# Patient Record
Sex: Male | Born: 1955
Health system: Southern US, Community
[De-identification: ages and names within clinical notes are randomized; demographics above are authoritative.]

## PROBLEM LIST (undated history)

## (undated) DIAGNOSIS — I82B12 Acute embolism and thrombosis of left subclavian vein: Secondary | ICD-10-CM

## (undated) DIAGNOSIS — Z952 Presence of prosthetic heart valve: Secondary | ICD-10-CM

## (undated) DIAGNOSIS — J189 Pneumonia, unspecified organism: Secondary | ICD-10-CM

## (undated) DIAGNOSIS — Z9289 Personal history of other medical treatment: Secondary | ICD-10-CM

## (undated) DIAGNOSIS — K8020 Calculus of gallbladder without cholecystitis without obstruction: Secondary | ICD-10-CM

## (undated) DIAGNOSIS — R911 Solitary pulmonary nodule: Secondary | ICD-10-CM

## (undated) DIAGNOSIS — R011 Cardiac murmur, unspecified: Secondary | ICD-10-CM

## (undated) DIAGNOSIS — D3A8 Other benign neuroendocrine tumors: Secondary | ICD-10-CM

## (undated) DIAGNOSIS — C787 Secondary malignant neoplasm of liver and intrahepatic bile duct: Secondary | ICD-10-CM

## (undated) DIAGNOSIS — J8410 Pulmonary fibrosis, unspecified: Secondary | ICD-10-CM

## (undated) DIAGNOSIS — I319 Disease of pericardium, unspecified: Secondary | ICD-10-CM

## (undated) DIAGNOSIS — E7800 Pure hypercholesterolemia, unspecified: Secondary | ICD-10-CM

## (undated) DIAGNOSIS — I10 Essential (primary) hypertension: Secondary | ICD-10-CM

## (undated) DIAGNOSIS — IMO0001 Reserved for inherently not codable concepts without codable children: Secondary | ICD-10-CM

## (undated) DIAGNOSIS — C6290 Malignant neoplasm of unspecified testis, unspecified whether descended or undescended: Secondary | ICD-10-CM

## (undated) DIAGNOSIS — M7120 Synovial cyst of popliteal space [Baker], unspecified knee: Secondary | ICD-10-CM

## (undated) HISTORY — DX: Malignant neoplasm of unspecified testis, unspecified whether descended or undescended: C62.90

## (undated) HISTORY — DX: Pulmonary fibrosis, unspecified: J84.10

## (undated) HISTORY — PX: KNEE SURGERY: SHX244

## (undated) HISTORY — PX: NASAL SEPTUM SURGERY: SHX37

## (undated) HISTORY — DX: Other benign neuroendocrine tumors: D3A.8

## (undated) HISTORY — PX: BACK SURGERY: SHX140

## (undated) HISTORY — DX: Synovial cyst of popliteal space (Baker), unspecified knee: M71.20

## (undated) HISTORY — DX: Disease of pericardium, unspecified: I31.9

## (undated) HISTORY — PX: COLONOSCOPY: SHX174

---

## 1988-08-05 DIAGNOSIS — J189 Pneumonia, unspecified organism: Secondary | ICD-10-CM

## 1988-08-05 DIAGNOSIS — C6290 Malignant neoplasm of unspecified testis, unspecified whether descended or undescended: Secondary | ICD-10-CM

## 1988-08-05 DIAGNOSIS — I82B12 Acute embolism and thrombosis of left subclavian vein: Secondary | ICD-10-CM

## 1988-08-05 HISTORY — DX: Pneumonia, unspecified organism: J18.9

## 1988-08-05 HISTORY — PX: THORACOTOMY: SUR1349

## 1988-08-05 HISTORY — DX: Malignant neoplasm of unspecified testis, unspecified whether descended or undescended: C62.90

## 1988-08-05 HISTORY — DX: Acute embolism and thrombosis of left subclavian vein: I82.B12

## 2004-12-28 ENCOUNTER — Ambulatory Visit: Payer: Self-pay | Admitting: Internal Medicine

## 2005-01-03 ENCOUNTER — Ambulatory Visit: Payer: Self-pay | Admitting: Internal Medicine

## 2005-04-19 LAB — HM COLONOSCOPY: HM Colonoscopy: NORMAL

## 2005-05-17 ENCOUNTER — Ambulatory Visit: Payer: Self-pay | Admitting: Internal Medicine

## 2005-05-21 ENCOUNTER — Ambulatory Visit: Payer: Self-pay | Admitting: Internal Medicine

## 2006-05-09 ENCOUNTER — Ambulatory Visit: Payer: Self-pay | Admitting: Internal Medicine

## 2006-05-16 ENCOUNTER — Ambulatory Visit: Payer: Self-pay | Admitting: Internal Medicine

## 2006-09-11 ENCOUNTER — Ambulatory Visit: Payer: Self-pay | Admitting: Internal Medicine

## 2006-09-11 LAB — CONVERTED CEMR LAB
Cholesterol: 205 mg/dL (ref 0–200)
Direct LDL: 134.5 mg/dL
HDL: 56.2 mg/dL (ref >39.0)
Total CHOL/HDL Ratio: 3.6
Triglycerides: 76 mg/dL (ref 0–149)
VLDL: 15 mg/dL (ref 0–40)

## 2007-06-06 ENCOUNTER — Encounter: Payer: Self-pay | Admitting: Internal Medicine

## 2007-06-06 DIAGNOSIS — M7120 Synovial cyst of popliteal space [Baker], unspecified knee: Secondary | ICD-10-CM | POA: Insufficient documentation

## 2007-07-09 ENCOUNTER — Ambulatory Visit: Payer: Self-pay | Admitting: Internal Medicine

## 2007-07-09 LAB — CONVERTED CEMR LAB
ALT: 25 units/L (ref 0–53)
AST: 21 units/L (ref 0–37)
Albumin: 3.9 g/dL (ref 3.5–5.2)
Alkaline Phosphatase: 46 units/L (ref 39–117)
BUN: 16 mg/dL (ref 6–23)
Basophils Absolute: 0 10*3/uL (ref 0.0–0.1)
Basophils Relative: 0.1 % (ref 0.0–1.0)
Bilirubin Urine: NEGATIVE
Bilirubin, Direct: 0.2 mg/dL (ref 0.0–0.3)
CO2: 34 meq/L — ABNORMAL HIGH (ref 19–32)
Calcium: 9.5 mg/dL (ref 8.4–10.5)
Chloride: 101 meq/L (ref 96–112)
Cholesterol: 213 mg/dL (ref 0–200)
Creatinine, Ser: 1.1 mg/dL (ref 0.4–1.5)
Direct LDL: 142.3 mg/dL
Eosinophils Absolute: 0.2 10*3/uL (ref 0.0–0.6)
Eosinophils Relative: 3.5 % (ref 0.0–5.0)
GFR calc Af Amer: 90 mL/min
GFR calc non Af Amer: 74 mL/min
Glucose, Bld: 94 mg/dL (ref 70–99)
HCT: 47.2 % (ref 39.0–52.0)
HDL: 50.9 mg/dL (ref >39.0)
Hemoglobin, Urine: NEGATIVE
Hemoglobin: 16.4 g/dL (ref 13.0–17.0)
Ketones, ur: NEGATIVE mg/dL
Leukocytes, UA: NEGATIVE
Lymphocytes Relative: 18.6 % (ref 12.0–46.0)
MCHC: 34.8 g/dL (ref 30.0–36.0)
MCV: 93.9 fL (ref 78.0–100.0)
Monocytes Absolute: 0.5 10*3/uL (ref 0.2–0.7)
Monocytes Relative: 8 % (ref 3.0–11.0)
Neutro Abs: 4 10*3/uL (ref 1.4–7.7)
Neutrophils Relative %: 69.8 % (ref 43.0–77.0)
Nitrite: NEGATIVE
PSA: 1.1 ng/mL (ref 0.10–4.00)
Platelets: 218 10*3/uL (ref 150–400)
Potassium: 4.5 meq/L (ref 3.5–5.1)
RBC: 5.03 M/uL (ref 4.22–5.81)
RDW: 11.6 % (ref 11.5–14.6)
Sodium: 140 meq/L (ref 135–145)
Specific Gravity, Urine: 1.015 (ref 1.000–1.03)
TSH: 1.12 microintl units/mL (ref 0.35–5.50)
Total Bilirubin: 0.9 mg/dL (ref 0.3–1.2)
Total CHOL/HDL Ratio: 4.2
Total Protein, Urine: NEGATIVE mg/dL
Total Protein: 6.4 g/dL (ref 6.0–8.3)
Triglycerides: 68 mg/dL (ref 0–149)
Urine Glucose: NEGATIVE mg/dL
Urobilinogen, UA: 0.2 (ref 0.0–1.0)
VLDL: 14 mg/dL (ref 0–40)
WBC: 5.8 10*3/uL (ref 4.5–10.5)
pH: 7 (ref 5.0–8.0)

## 2007-07-10 ENCOUNTER — Encounter: Payer: Self-pay | Admitting: Internal Medicine

## 2007-07-16 ENCOUNTER — Ambulatory Visit: Payer: Self-pay | Admitting: Internal Medicine

## 2007-07-16 DIAGNOSIS — E785 Hyperlipidemia, unspecified: Secondary | ICD-10-CM | POA: Insufficient documentation

## 2007-08-27 ENCOUNTER — Ambulatory Visit: Payer: Self-pay | Admitting: Internal Medicine

## 2007-08-27 LAB — CONVERTED CEMR LAB
ALT: 31 units/L (ref 0–53)
AST: 26 units/L (ref 0–37)
Albumin: 4.1 g/dL (ref 3.5–5.2)
Alkaline Phosphatase: 48 units/L (ref 39–117)
Bilirubin, Direct: 0.2 mg/dL (ref 0.0–0.3)
Cholesterol: 174 mg/dL (ref 0–200)
HDL: 55.7 mg/dL (ref >39.0)
LDL Cholesterol: 107 mg/dL — ABNORMAL HIGH (ref 0–99)
Total Bilirubin: 1.3 mg/dL — ABNORMAL HIGH (ref 0.3–1.2)
Total CHOL/HDL Ratio: 3.1
Total Protein: 6.9 g/dL (ref 6.0–8.3)
Triglycerides: 56 mg/dL (ref 0–149)
VLDL: 11 mg/dL (ref 0–40)

## 2007-09-01 ENCOUNTER — Encounter: Payer: Self-pay | Admitting: Internal Medicine

## 2007-10-16 ENCOUNTER — Encounter: Payer: Self-pay | Admitting: Internal Medicine

## 2008-10-10 ENCOUNTER — Ambulatory Visit: Payer: Self-pay | Admitting: Internal Medicine

## 2008-10-10 LAB — CONVERTED CEMR LAB
ALT: 26 units/L (ref 0–53)
AST: 24 units/L (ref 0–37)
Albumin: 3.9 g/dL (ref 3.5–5.2)
Alkaline Phosphatase: 44 units/L (ref 39–117)
BUN: 17 mg/dL (ref 6–23)
Basophils Absolute: 0 10*3/uL (ref 0.0–0.1)
Basophils Relative: 0.1 % (ref 0.0–3.0)
Bilirubin Urine: NEGATIVE
Bilirubin, Direct: 0.2 mg/dL (ref 0.0–0.3)
CO2: 35 meq/L — ABNORMAL HIGH (ref 19–32)
Calcium: 9 mg/dL (ref 8.4–10.5)
Chloride: 100 meq/L (ref 96–112)
Cholesterol: 202 mg/dL (ref 0–200)
Creatinine, Ser: 1 mg/dL (ref 0.4–1.5)
Direct LDL: 139.2 mg/dL
Eosinophils Absolute: 0.2 10*3/uL (ref 0.0–0.7)
Eosinophils Relative: 3.8 % (ref 0.0–5.0)
GFR calc Af Amer: 100 mL/min
GFR calc non Af Amer: 83 mL/min
Glucose, Bld: 98 mg/dL (ref 70–99)
HCT: 46.4 % (ref 39.0–52.0)
HDL: 43 mg/dL (ref >39.0)
Hemoglobin, Urine: NEGATIVE
Hemoglobin: 16 g/dL (ref 13.0–17.0)
Ketones, ur: NEGATIVE mg/dL
Leukocytes, UA: NEGATIVE
Lymphocytes Relative: 15.8 % (ref 12.0–46.0)
MCHC: 34.4 g/dL (ref 30.0–36.0)
MCV: 93.7 fL (ref 78.0–100.0)
Monocytes Absolute: 0.5 10*3/uL (ref 0.1–1.0)
Monocytes Relative: 9 % (ref 3.0–12.0)
Neutro Abs: 3.8 10*3/uL (ref 1.4–7.7)
Neutrophils Relative %: 71.3 % (ref 43.0–77.0)
Nitrite: NEGATIVE
PSA: 0.8 ng/mL (ref 0.10–4.00)
Platelets: 202 10*3/uL (ref 150–400)
Potassium: 4.6 meq/L (ref 3.5–5.1)
RBC: 4.95 M/uL (ref 4.22–5.81)
RDW: 11.9 % (ref 11.5–14.6)
Sodium: 140 meq/L (ref 135–145)
Specific Gravity, Urine: 1.015 (ref 1.000–1.035)
TSH: 0.97 microintl units/mL (ref 0.35–5.50)
Total Bilirubin: 1.3 mg/dL — ABNORMAL HIGH (ref 0.3–1.2)
Total CHOL/HDL Ratio: 4.7
Total Protein, Urine: NEGATIVE mg/dL
Total Protein: 6.3 g/dL (ref 6.0–8.3)
Triglycerides: 63 mg/dL (ref 0–149)
Urine Glucose: NEGATIVE mg/dL
Urobilinogen, UA: 0.2 (ref 0.0–1.0)
VLDL: 13 mg/dL (ref 0–40)
WBC: 5.4 10*3/uL (ref 4.5–10.5)
pH: 7 (ref 5.0–8.0)

## 2008-10-14 ENCOUNTER — Ambulatory Visit: Payer: Self-pay | Admitting: Internal Medicine

## 2008-10-14 DIAGNOSIS — R030 Elevated blood-pressure reading, without diagnosis of hypertension: Secondary | ICD-10-CM | POA: Insufficient documentation

## 2008-10-15 DIAGNOSIS — C6290 Malignant neoplasm of unspecified testis, unspecified whether descended or undescended: Secondary | ICD-10-CM | POA: Insufficient documentation

## 2008-10-19 ENCOUNTER — Telehealth: Payer: Self-pay | Admitting: Internal Medicine

## 2009-01-18 ENCOUNTER — Telehealth: Payer: Self-pay | Admitting: Internal Medicine

## 2009-01-31 ENCOUNTER — Telehealth: Payer: Self-pay | Admitting: Internal Medicine

## 2009-11-16 ENCOUNTER — Telehealth: Payer: Self-pay | Admitting: Internal Medicine

## 2010-06-21 ENCOUNTER — Ambulatory Visit: Payer: Self-pay | Admitting: Internal Medicine

## 2010-06-21 LAB — CONVERTED CEMR LAB
ALT: 29 units/L (ref 0–53)
AST: 26 units/L (ref 0–37)
Albumin: 4.3 g/dL (ref 3.5–5.2)
Alkaline Phosphatase: 59 units/L (ref 39–117)
BUN: 19 mg/dL (ref 6–23)
Basophils Absolute: 0 10*3/uL (ref 0.0–0.1)
Basophils Relative: 0.5 % (ref 0.0–3.0)
Bilirubin Urine: NEGATIVE
Bilirubin, Direct: 0.1 mg/dL (ref 0.0–0.3)
CO2: 32 meq/L (ref 19–32)
Calcium: 9.6 mg/dL (ref 8.4–10.5)
Chloride: 98 meq/L (ref 96–112)
Cholesterol: 169 mg/dL (ref 0–200)
Creatinine, Ser: 1.2 mg/dL (ref 0.4–1.5)
Eosinophils Absolute: 0.2 10*3/uL (ref 0.0–0.7)
Eosinophils Relative: 2.8 % (ref 0.0–5.0)
GFR calc non Af Amer: 69.06 mL/min (ref >60)
Glucose, Bld: 97 mg/dL (ref 70–99)
HCT: 48.2 % (ref 39.0–52.0)
HDL: 47 mg/dL (ref >39.00)
Hemoglobin: 16.8 g/dL (ref 13.0–17.0)
Ketones, ur: NEGATIVE mg/dL
LDL Cholesterol: 108 mg/dL — ABNORMAL HIGH (ref 0–99)
Leukocytes, UA: NEGATIVE
Lymphocytes Relative: 13.4 % (ref 12.0–46.0)
Lymphs Abs: 0.9 10*3/uL (ref 0.7–4.0)
MCHC: 34.9 g/dL (ref 30.0–36.0)
MCV: 93.3 fL (ref 78.0–100.0)
Monocytes Absolute: 0.6 10*3/uL (ref 0.1–1.0)
Monocytes Relative: 8.5 % (ref 3.0–12.0)
Neutro Abs: 5.2 10*3/uL (ref 1.4–7.7)
Neutrophils Relative %: 74.8 % (ref 43.0–77.0)
Nitrite: NEGATIVE
PSA: 1.14 ng/mL (ref 0.10–4.00)
Platelets: 239 10*3/uL (ref 150.0–400.0)
Potassium: 5.2 meq/L — ABNORMAL HIGH (ref 3.5–5.1)
RBC: 5.17 M/uL (ref 4.22–5.81)
RDW: 12.6 % (ref 11.5–14.6)
Sodium: 141 meq/L (ref 135–145)
Specific Gravity, Urine: 1.02 (ref 1.000–1.030)
TSH: 1.31 microintl units/mL (ref 0.35–5.50)
Total Bilirubin: 1 mg/dL (ref 0.3–1.2)
Total CHOL/HDL Ratio: 4
Total Protein, Urine: NEGATIVE mg/dL
Total Protein: 6.8 g/dL (ref 6.0–8.3)
Triglycerides: 72 mg/dL (ref 0.0–149.0)
Urine Glucose: NEGATIVE mg/dL
Urobilinogen, UA: 0.2 (ref 0.0–1.0)
VLDL: 14.4 mg/dL (ref 0.0–40.0)
WBC: 7 10*3/uL (ref 4.5–10.5)
pH: 6 (ref 5.0–8.0)

## 2010-06-25 ENCOUNTER — Ambulatory Visit: Payer: Self-pay | Admitting: Internal Medicine

## 2010-06-25 ENCOUNTER — Encounter: Payer: Self-pay | Admitting: Internal Medicine

## 2010-06-25 DIAGNOSIS — R011 Cardiac murmur, unspecified: Secondary | ICD-10-CM | POA: Insufficient documentation

## 2010-06-26 ENCOUNTER — Encounter (INDEPENDENT_AMBULATORY_CARE_PROVIDER_SITE_OTHER): Payer: Self-pay | Admitting: *Deleted

## 2010-07-09 ENCOUNTER — Ambulatory Visit: Payer: Self-pay

## 2010-07-09 ENCOUNTER — Ambulatory Visit (HOSPITAL_COMMUNITY)
Admission: RE | Admit: 2010-07-09 | Discharge: 2010-07-09 | Payer: Self-pay | Source: Home / Self Care | Admitting: Internal Medicine

## 2010-07-09 ENCOUNTER — Encounter: Payer: Self-pay | Admitting: Internal Medicine

## 2010-07-12 ENCOUNTER — Encounter: Payer: Self-pay | Admitting: Internal Medicine

## 2010-07-23 ENCOUNTER — Ambulatory Visit: Payer: Self-pay | Admitting: Internal Medicine

## 2010-08-14 ENCOUNTER — Other Ambulatory Visit: Payer: Self-pay | Admitting: Internal Medicine

## 2010-08-14 LAB — BASIC METABOLIC PANEL
BUN: 23 mg/dL (ref 6–23)
CO2: 35 mEq/L — ABNORMAL HIGH (ref 19–32)
Calcium: 9.4 mg/dL (ref 8.4–10.5)
Chloride: 102 mEq/L (ref 96–112)
Creatinine, Ser: 1.1 mg/dL (ref 0.4–1.5)
GFR: 75.77 mL/min (ref >60.00)
Glucose, Bld: 134 mg/dL — ABNORMAL HIGH (ref 70–99)
Potassium: 4.7 mEq/L (ref 3.5–5.1)
Sodium: 141 mEq/L (ref 135–145)

## 2010-08-14 LAB — TESTOSTERONE: Testosterone: 359.74 ng/dL (ref 350.00–890.00)

## 2010-08-23 ENCOUNTER — Encounter: Payer: Self-pay | Admitting: Internal Medicine

## 2010-09-04 NOTE — Assessment & Plan Note (Signed)
Summary: PHYSICAL--STC   Vital Signs:  Patient profile:   55 year old male Height:      69 inches Weight:      190 pounds BMI:     28.16 O2 Sat:      98 % on Room air Temp:     97.6 degrees F oral Pulse rate:   79 / minute BP sitting:   138 / 98  (left arm) Cuff size:   large  Vitals Entered By: Bill Salinas CMA (June 25, 2010 2:36 PM)  O2 Flow:  Room air CC: cpx/ ab   Primary Care Jeanell Mangan:  Norins  CC:  cpx/ ab.  History of Present Illness: patient presents for routine  biannual check-up. has been doing well. However, he has continued to have mild hypertension that did not repsond to HCTZ which he subsequently stopped. Everyting else is fine with no medical complaints.   Current Medications (verified): 1)  Simvastatin 20 Mg  Tabs (Simvastatin) .Marland Kitchen.. 1 Q Pm 2)  Hydrochlorothiazide 25 Mg Tabs (Hydrochlorothiazide) .Marland Kitchen.. 1 By Mouth Once Daily  Allergies (verified): 1)  ! 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 married 1977 2 sons: 1979, 26- married with 2 daughters 1 in route: Oldest in [employer] [employer], younger [place], [place] 1 daughter: 31 [place], married-1 granddaughter Psychologist, occupational- Energy manager SO- good health; marriage in good health  Review of Systems       The patient complains of dyspnea on exertion and severe indigestion/heartburn.  The patient denies anorexia, fever, weight loss, weight gain, decreased hearing, hoarseness, peripheral edema, prolonged cough,  headaches, abdominal pain, melena, hematochezia, hematuria, genital sores, suspicious skin lesions, difficulty walking, unusual weight change, enlarged lymph nodes, angioedema, and testicular masses.    Physical Exam  General:  Well-developed,well-nourished,in no acute distress; alert,appropriate and cooperative throughout examination Head:  Normocephalic and atraumatic without obvious abnormalities. No apparent alopecia or balding. Eyes:  No corneal or conjunctival inflammation noted. EOMI. Perrla. Funduscopic exam benign, without hemorrhages, exudates or papilledema. Vision grossly normal. Ears:  External ear exam shows no significant lesions or deformities.  Otoscopic examination reveals clear canals, tympanic membranes are intact bilaterally without bulging, retraction, inflammation or discharge. Hearing is grossly normal bilaterally. Nose:  no external deformity and no external erythema.   Mouth:  Oral mucosa and oropharynx without lesions or exudates.  Teeth in good repair. Neck:  supple, full ROM, no thyromegaly, and no carotid bruits.   Chest Wall:  No deformities, masses, tenderness or gynecomastia noted. Lungs:  Normal respiratory effort, chest expands symmetrically. Lungs are clear to auscultation, no crackles or wheezes. Heart:  normal rate and regular rhythm.  III/VI murmur at RSB with I/VI diastolic component; II/VI murmur at LSB; II/VI murmur at apex.  Abdomen:  soft, non-tender, normal bowel sounds, no guarding, no rigidity, and no hepatomegaly.   Genitalia:  circumcised, no hydrocele, no varicocele, no scrotal masses, and no testicular masses or atrophy.   Prostate:  deferered Msk:  normal ROM, no joint tenderness, no joint swelling, no joint warmth, no joint  deformities, and no joint instability.   Pulses:  2+ radial, DP and PT pulses bilaterally Extremities:  No clubbing, cyanosis, edema, or deformity noted with normal full range of motion of all joints.   Neurologic:  alert &  oriented X3, cranial nerves II-XII intact, strength normal in all extremities, sensation intact to light touch, gait normal, and DTRs symmetrical and normal.   Skin:  turgor normal, color normal, no rashes, no suspicious lesions, and no ulcerations.   Cervical Nodes:  no anterior cervical adenopathy and no posterior cervical adenopathy.   Inguinal Nodes:  no R inguinal adenopathy and no L inguinal adenopathy.   Psych:  Oriented X3, memory intact for recent and remote, normally interactive, and good eye contact.     Impression & Recommendations:  Problem # 1:  HEART MURMUR, SYSTOLIC (ICD-785.2) Patient with increased murmurs. He has had a mild murmur in the past. He reports that a doctor at Urgent Care in [place] recently commented on him having a murmur.  Plan - 2D echo to better quantify murmurs.  Orders: Cardiology Referral (Cardiology)  Problem # 2:  PREHYPERTENSION (ICD-796.2) Patient blood pressure is definitely in the abnormal range. Discussed this with him  Plan - will start with ARB therapy - losartan 50mg  once daily           follow-up metabolic panel in one month along with BP check  The following medications were removed from the medication list:    Hydrochlorothiazide 25 Mg Tabs (Hydrochlorothiazide) .Marland Kitchen... 1 by mouth once daily His updated medication list for this problem includes:    Losartan Potassium 50 Mg Tabs (Losartan potassium) .Marland Kitchen... 1 by mouth once daily  Problem # 3:  DYSLIPIDEMIA (ICD-272.4)  His updated medication list for this problem includes:    Simvastatin 20 Mg Tabs (Simvastatin) .Marland Kitchen... 1 q pm  Labs Reviewed: SGOT: 26 (06/21/2010)   SGPT: 29 (06/21/2010)   HDL:47.00 (06/21/2010), 43.0 (10/10/2008)  LDL:108 (06/21/2010), DEL (10/10/2008)  Chol:169 (06/21/2010), 202 (10/10/2008)  Trig:72.0 (06/21/2010), 63 (10/10/2008)  Excellent control on present medication. Will continue the same  Problem # 4:  Hx of SEMINOMA (ICD-186.9) He has had a long term  stable remission  Problem # 5:  Preventive Health Care (ICD-V70.0) Interval history is unremarkable. His physical exam is normal except for murmurs appreciated. Lab results are excellent. Current with colorectal cancer screening with colonoscopy in June '06; Immunizations: tetnus and flu Nov '11. 12 lead EKG is negative for any injury or ischemia.  He does report decreased libido, energy and stamina. Discussed the possibility of hypogonadism. He will return for testosterone level in one month.  In summary - a very nice man who is medically stable with cardiac murmurs that will be evaluated with recommendations to follow the study. He will return for BP monitoring as noted.   Complete Medication List: 1)  Simvastatin 20 Mg Tabs (Simvastatin) .Marland Kitchen.. 1 q pm 2)  Losartan Potassium 50 Mg Tabs (Losartan potassium) .Marland Kitchen.. 1 by mouth once daily  Patient: Ronald Thompson Note: All result statuses are Final unless otherwise noted.  Tests: (1) BMP (METABOL)   Sodium                    141 mEq/L                   135-145   Potassium            [H]  5.2 mEq/L  3.5-5.1   Chloride                  98 mEq/L                    96-112   Carbon Dioxide            32 mEq/L                    19-32   Glucose                   97 mg/dL                    18-84   BUN                       19 mg/dL                    1-66   Creatinine                1.2 mg/dL                   0.6-3.0   Calcium                   9.6 mg/dL                   1.6-01.0   GFR                       69.06 mL/min                >60  Tests: (2) CBC Platelet w/Diff (CBCD)   White Cell Count          7.0 K/uL                    4.5-10.5   Red Cell Count            5.17 Mil/uL                 4.22-5.81   Hemoglobin                16.8 g/dL                   93.2-35.5   Hematocrit                48.2 %                      39.0-52.0   MCV                       93.3 fl                     78.0-100.0   MCHC                       34.9 g/dL                   73.2-20.2   RDW                       12.6 %                      11.5-14.6   Platelet Count  239.0 K/uL                  150.0-400.0   Neutrophil %              74.8 %                      43.0-77.0   Lymphocyte %              13.4 %                      12.0-46.0   Monocyte %                8.5 %                       3.0-12.0   Eosinophils%              2.8 %                       0.0-5.0   Basophils %               0.5 %                       0.0-3.0   Neutrophill Absolute      5.2 K/uL                    1.4-7.7   Lymphocyte Absolute       0.9 K/uL                    0.7-4.0   Monocyte Absolute         0.6 K/uL                    0.1-1.0  Eosinophils, Absolute                             0.2 K/uL                    0.0-0.7   Basophils Absolute        0.0 K/uL                    0.0-0.1  Tests: (3) Hepatic/Liver Function Panel (HEPATIC)   Total Bilirubin           1.0 mg/dL                   5.1-8.8   Direct Bilirubin          0.1 mg/dL                   4.1-6.6   Alkaline Phosphatase      59 U/L                      39-117   AST                       26 U/L                      0-37   ALT                       29 U/L  0-53   Total Protein             6.8 g/dL                    0.9-8.1   Albumin                   4.3 g/dL                    1.9-1.4  Tests: (4) TSH (TSH)   FastTSH                   1.31 uIU/mL                 0.35-5.50  Tests: (5) Lipid Panel (LIPID)   Cholesterol               169 mg/dL                   7-829     ATP III Classification            Desirable:  < 200 mg/dL                    Borderline High:  200 - 239 mg/dL               High:  > = 240 mg/dL   Triglycerides             72.0 mg/dL                  5.6-213.0     Normal:  <150 mg/dL     Borderline High:  865 - 199 mg/dL   HDL                       78.46 mg/dL                 >96.29   VLDL Cholesterol          14.4 mg/dL                   5.2-84.1   LDL Cholesterol      [H]  324 mg/dL                   4-01  CHO/HDL Ratio:  CHD Risk                             4                    Men          Women     1/2 Average Risk     3.4          3.3     Average Risk          5.0          4.4     2X Average Risk          9.6          7.1     3X Average Risk          15.0          11.0                           Tests: (6) Prostate  Specific Antigen (PSA)   PSA-Hyb                   1.14 ng/mL                  0.10-4.00  Tests: (7) UDip w/Micro (URINE)   Color                     LT. YELLOW       RANGE:  Yellow;Lt. Yellow   Clarity                   CLEAR                       Clear   Specific Gravity          1.020                       1.000 - 1.030   Urine Ph                  6.0                         5.0-8.0   Protein                   NEGATIVE                    Negative   Urine Glucose             NEGATIVE                    Negative   Ketones                   NEGATIVE                    Negative   Urine Bilirubin           NEGATIVE                    Negative   Blood                     SMALL                       Negative   Urobilinogen              0.2                         0.0 - 1.0   Leukocyte Esterace        NEGATIVE                    Negative   Nitrite                   NEGATIVE                    Negative   Urine WBC                 0-2/hpf                     0-2/hpf   Urine RBC                 0-2/hpf  0-2/hpf   Urine Mucus               Presence of                 NonePrescriptions: SIMVASTATIN 20 MG  TABS (SIMVASTATIN) 1 q PM  #30 Tablet x 12   Entered and Authorized by:   Jacques Navy MD   Signed by:   Jacques Navy MD on 06/25/2010   Method used:   Electronically to        [facility] (retail)       [address]       [place], [place]  [address]       Ph: [phone] or [phone]       Fax: [phone]   RxID:    [id number] LOSARTAN POTASSIUM 50 MG TABS (LOSARTAN POTASSIUM) 1 by mouth once daily  #30 x 12   Entered and Authorized by:   Jacques Navy MD   Signed by:   Jacques Navy MD on 06/25/2010   Method used:   Electronically to        [facility] (retail)       [address]       [place], [place]  [address]       Ph: [phone] or [phone]       Fax: [phone]   RxID:   [id number]    Orders Added: 1)  Cardiology Referral [Cardiology] 2)  Est. Patient Level III [[address]] 3)  Est. Patient 40-64 years [[address]]   Immunization History:  Tetanus/Td Immunization History:    Tetanus/Td:  historical (06/09/2010)  Influenza Immunization History:    Influenza:  historical (06/09/2010)   Immunization History:  Tetanus/Td Immunization History:    Tetanus/Td:  Historical (06/09/2010)  Influenza Immunization History:    Influenza:  Historical (06/09/2010)

## 2010-09-04 NOTE — Letter (Signed)
   [facility] [address] [place], [place]  [address] Phone: [phone]      July 12, 2010   [place] [facility] [address] [place], [place] [address]  RE:  LAB RESULTS  Dear  Mr. Manfredo,  The following is an interpretation of your most recent lab tests.  Please take note of any instructions provided or changes to medications that have resulted from your lab work.   2 D echo reveals mild wall thickening of no pathologic significance with a normal ejection fraction - juice per squeeze. There is some calcification of the aortic valve with a mild increase cross valvular pressure and this is definitely on of the murmurs we're hearing. There was also mild regurgitation across the tricuspid valve that may also be a source of a feint murmur heard at the left sternal border. Neither of these valvular findings are worrisome. Recommendation would be a follow-up echo in 12-30months.   Please come see me if you have any questions about these lab results.   Sincerely Yours,    Jacques Navy MD

## 2010-09-04 NOTE — Progress Notes (Signed)
  Phone Note Refill Request Message from:  Fax from Pharmacy on November 16, 2009 10:18 AM  Refills Requested: Medication #1:  SIMVASTATIN 20 MG  TABS 1 q PM Initial call taken by: Ami Bullins CMA,  November 16, 2009 10:18 AM    Prescriptions: SIMVASTATIN 20 MG  TABS (SIMVASTATIN) 1 q PM  #30 Tablet x 6   Entered by:   Ami Bullins CMA   Authorized by:   Jacques Navy MD   Signed by:   Bill Salinas CMA on 11/16/2009   Method used:   Electronically to        [facility] (retail)       [address]       [place], [place]  [address]       Ph: [phone] or [phone]       Fax: [phone]   RxID:   [id number]

## 2010-09-06 NOTE — Letter (Signed)
Summary: [facility] Consult Scheduled Letter  [facility]  [address] [place], [place] [address]   Phone: [phone]  Fax: [phone]      06/26/2010 MRN: [id number]  [facility] [address] [place], [place]  [address]    Dear Ronald Thompson,      We have scheduled an appointment for you. At the recommendation of Dr.Norins, we have scheduled you a consult with [facility] on Dec 5,2011 at8:30 am.Their phone number is [phone].If this appointment day and time is not convenient for you, please feel free to call the office of the doctor you are being referred to at the number listed above and reschedule the appointment.  [facility] [address] [place] [address]  Thank you,  Patient Care Coordinator [facility]

## 2010-09-06 NOTE — Letter (Signed)
   [facility] [address] [place], [place]  [address] Phone: [phone]      August 23, 2010   [name] [address] [place], [place] [address]  RE:  LAB RESULTS  Dear  [name],  The following is an interpretation of your most recent lab tests.  Please take note of any instructions provided or changes to medications that have resulted from your lab work.  ELECTROLYTES:  Good - no changes needed  KIDNEY FUNCTION TESTS:  Good - no changes needed    DIABETIC STUDIES:  Improved - continue management Blood Glucose: 134   testosterone level no in normal range.    Please come see me if you have any questions about these lab results.   Sincerely Yours,    Jacques Navy MD  Patient: Ronald Thompson Note: All result statuses are Final unless otherwise noted.  Tests: (1) BMP (METABOL)   Sodium                    141 mEq/L                   135-145   Potassium                 4.7 mEq/L                   3.5-5.1   Chloride                  102 mEq/L                   96-112   Carbon Dioxide       [H]  35 mEq/L                    19-32   Glucose              [H]  134 mg/dL                   29-56   BUN                       23 mg/dL                    2-13   Creatinine                1.1 mg/dL                   0.8-6.5   Calcium                   9.4 mg/dL                   7.8-46.9   GFR                       75.77 mL/min                >60.00  Tests: (2) Testosterone, Total (TESTO)   Testosterone              359.74 ng/dL                629.52-841.32

## 2010-12-21 NOTE — Assessment & Plan Note (Signed)
[facility]                             PRIMARY CARE OFFICE NOTE   Ronald Thompson                          MRN:          [id number]  DATE:05/16/2006                            DOB:          1955-10-09    [name] is a very pleasant 55 year old Caucasian gentleman who presents for  followup evaluation and exam.  He was last seen in the office on 05/21/2005  for a persistent cough which eventually did clear.  His last full physical  exam seems to have been 01/03/2005 via a handwritten note.   INTERVAL HISTORY:  The patient medically has been doing very well. He  reports that he has been off of his usual regimen of exercise and diet.   PAST HISTORY:  SURGICAL:  The patient has had a left thoracotomy and a wedge  biopsy of a mediastinal mass.  No other surgery is noted.  MEDICAL:  1. The patient has had the usual childhood diseases.  2. History of pericarditis.  3. Metastatic testicular seminoma with a mass in the mediastinum and chest      treated with chemotherapy with good results with longstanding      remission.  Continue to followup at [facility].  The patient has a history of      a Bakers cyst.   FAMILY HISTORY:  Significant for colon cancer, emphysema and cancer of the  esophagus in the maternal grandfather.   SOCIAL HISTORY:  The patient is a Psychologist, occupational.  He has a son in [place] who has  a baby, his grandchild.  He has another child in [place].  The patient  reports that it is trying here socially with his father having passed away  at age 99 from rapidly progressive Parkinson's disease.  His mother has been  placed in assisted living/nursing facility for significant psychiatric and  medical illnesses.  His father-in-law had bypass surgery.  The patient also  has been undertaking a new job, recently moved down to [place] and has a  new house. He has a new grandchild which is a positive thing.   REVIEW OF SYSTEMS:  The patient has no  fevers, sweats, or chills.  Weight  has been stable.  He does have both sleep latency and duration insomnia.  Last eye exam was within the last 12 months, no ENT complaints.  No  cardiovascular or respiratory problems.  He has occasional heartburn treated  with Tums.  Last colonoscopy performed in [place] in 2006 which was  negative with no polyps or abnormalities noted.  GU:  The patient has  nocturia x1, occasional frequency, but no loss of control problems.  No  musculoskeletal or dermatologic, neurologic, or psychiatric issues.   CURRENT MEDICATIONS:  No prescription drugs.   PHYSICAL EXAMINATION:  Temperature was 97.8, blood pressure 125/89, pulse  was 75.  Weight is 190.  BMI is 26.1 putting him into the overweight  category.  GENERAL APPEARANCE:  A well-nourished, well-developed, mildly overweight,  Caucasian male in no acute distress.  HEENT EXAM:  Normocephalic, atraumatic.  Right EAC with cerumen  impaction  that irrigated easily.  The left EAC and TM were normal.  Oropharynx with  native dentition in good repair.  No buccal or palatal lesions were noted.  Posterior pharynx was clear.  Conjunctivae and sclerae were clear.  PERRLA.  EOMI.  Funduscopic exam was unremarkable.  Neck was supple without thyromegaly.  NODES:  No adenopathy was noted in the cervical or supraclavicular regions.  CHEST:  Patient has a well-healed surgical scar in the anterior left chest  wall. The patient had no CVA tenderness.  The patient is noted to have a  hypervascular venous pattern on his left chest.  LUNGS:  Clear with no rales, wheezes or rhonchi, with good breath sounds in  all fields.  No wheezing is appreciated.  CARDIOVASCULAR:  2+ peripheral pulse, no JVD or carotid bruits.  He had a  quiet precordium with a regular rate and rhythm without murmurs, rubs, or  gallops.  ABDOMEN:  The patient had positive bowel sounds in all 4 quadrants.  There  was no organosplenomegaly.  The patient  has a 4-to-5-cm rounded mass at the  suprapubic region which is nontender.  It does not reduce like a hernia,  feels somewhat firm.  Normal male phallus, bilaterally descended testicles without masses.  RECTAL EXAM:  Normal sphincter tone was noted.  Prostate was smooth, round,  normal in size and contour without nodules.  EXTREMITIES:  Without clubbing, cyanosis, or edema.  No deformities are  noted.  SKIN:  Clear except with venous pattern changes noted.   DATA BASE:  A 12-lead electrocardiogram revealed a normal sinus rhythm with  possible biatrial enlargement, but otherwise unremarkable.   LABORATORY:  Revealed a hemoglobin of 16.8 grams, white count was 5700 with  70.7% segs, 17% lymphs, 9.6% monos, 4% eosinophils.  Chemistries were  unremarkable with serum glucose of 99.  Electrolytes were normal.  Creatinine 1.1.  GFR was 75 mL/min.  Liver functions were normal.  Cholesterol 234, triglycerides 69, HDL 46.6, LDL 178.3.  Thyroid function  normal with a TSH of 1.30 PSA was normal at 0.94.  Urinalysis was negative.   ASSESSMENT AND PLAN:  1. Oncology.  The patient is now 16 years out from metastatic testicular      seminoma with longstanding durable remission. He is followed up at      [facility].  I have asked him at his next visit to have records forwarded to      me, particularly imaging studies.  2. Hyperlipidemia.  The patient with significantly elevated LDL.      Discussed with him the NCEP guidelines for a male at low-risk given his      low cardiac risk profile having male gender and cholesterol as his only      risk factor.  Goal would be an LDL of less than 130, closer to 100 if      possible.  Plan is life-style diet management with a repeat laboratory      in 4 months.  Medical recommendations to follow at that time.  3. Insomnia.  Discussed sleep hygiene with the patient, in particular,     avoiding be awake in bed and reinforcing negative behavior.  I have      also  encouraged him to decrease his caffeine after midday.  He should      resume his exercise program which will certainly help his sleep.  4. Health maintenance.  The patient is currently up to date with  colorectal cancer screening.  Prostate exam is normal.  PSA is normal.   SUMMARY:  This is a very pleasant gentleman who seems to be medically in  good condition.  I have encouraged him to resume his exercise and diet  habits.  He will return to see me in 1 year or on a p.r.n. basis.            ______________________________  Rosalyn Gess Norins, MD      MEN/MedQ  DD:  05/16/2006  DT:  05/19/2006  Job #:  [id number]   cc:   Ginette Otto [id number] Mr. Jodene Nam, [address]

## 2011-08-08 ENCOUNTER — Other Ambulatory Visit: Payer: Self-pay | Admitting: *Deleted

## 2011-08-08 MED ORDER — LOSARTAN POTASSIUM 50 MG PO TABS: 50.0000 mg | ORAL_TABLET | Freq: Every day | ORAL | Status: DC

## 2011-08-08 NOTE — Telephone Encounter (Signed)
Done

## 2011-09-06 ENCOUNTER — Other Ambulatory Visit: Payer: Self-pay | Admitting: Internal Medicine

## 2011-09-18 ENCOUNTER — Other Ambulatory Visit: Payer: Self-pay | Admitting: Internal Medicine

## 2011-09-18 NOTE — Telephone Encounter (Signed)
Done w/notation overdue for OV, no further month supply of Rx given w/o appointment.

## 2011-10-25 ENCOUNTER — Other Ambulatory Visit: Payer: Self-pay | Admitting: Internal Medicine

## 2011-10-28 NOTE — Telephone Encounter (Signed)
Done

## 2011-11-17 DIAGNOSIS — Y92009 Unspecified place in unspecified non-institutional (private) residence as the place of occurrence of the external cause: Secondary | ICD-10-CM | POA: Insufficient documentation

## 2011-11-17 DIAGNOSIS — W06XXXA Fall from bed, initial encounter: Secondary | ICD-10-CM | POA: Insufficient documentation

## 2011-11-17 DIAGNOSIS — I10 Essential (primary) hypertension: Secondary | ICD-10-CM | POA: Insufficient documentation

## 2011-11-17 DIAGNOSIS — S0180XA Unspecified open wound of other part of head, initial encounter: Secondary | ICD-10-CM | POA: Insufficient documentation

## 2011-11-18 ENCOUNTER — Emergency Department (HOSPITAL_COMMUNITY)
Admission: EM | Admit: 2011-11-18 | Discharge: 2011-11-18 | Disposition: A | Discharge status: Home / Self Care | Payer: BC Managed Care – PPO | Attending: Emergency Medicine | Admitting: Emergency Medicine

## 2011-11-18 ENCOUNTER — Encounter (HOSPITAL_COMMUNITY): Payer: Self-pay | Admitting: *Deleted

## 2011-11-18 DIAGNOSIS — S0181XA Laceration without foreign body of other part of head, initial encounter: Secondary | ICD-10-CM

## 2011-11-18 HISTORY — DX: Essential (primary) hypertension: I10

## 2011-11-18 MED ORDER — TETANUS-DIPHTH-ACELL PERTUSSIS 5-2.5-18.5 LF-MCG/0.5 IM SUSP
0.5000 mL | Freq: Once | INTRAMUSCULAR | Status: AC
  Administered 2011-11-18: 0.5 mL via INTRAMUSCULAR
  Filled 2011-11-18: qty 0.5

## 2011-11-18 NOTE — ED Notes (Signed)
Pt states understands discharge instructions

## 2011-11-18 NOTE — Discharge Instructions (Signed)
You were treated for a small laceration to forehead. You had 4 sutures placed to help stop the bleeding and close your wound. Please followup with your primary care provider, urgent care Center or return to the emergency room in 5 days to have your sutures removed. You may wash over the skin gently with soap and water. Dry thoroughly. You may use antibacterial ointment such as Neosporin to help prevent infection. Return to the emergency room or followup with a primary care provider if you have any redness, fever, chills or continued bleeding.  Facial Laceration A facial laceration is a cut on the face. Lacerations usually heal quickly, but they need special care to reduce scarring. It will take 1 to 2 years for the scar to lose its redness and to heal completely. TREATMENT  Some facial lacerations may not require closure. Some lacerations may not be able to be closed due to an increased risk of infection. It is important to see your caregiver as soon as possible after an injury to minimize the risk of infection and to maximize the opportunity for successful closure. If closure is appropriate, pain medicines may be given, if needed. The wound will be cleaned to help prevent infection. Your caregiver will use stitches (sutures), staples, wound glue (adhesive), or skin adhesive strips to repair the laceration. These tools bring the skin edges together to allow for faster healing and a better cosmetic outcome. However, all wounds will heal with a scar.  Once the wound has healed, scarring can be minimized by covering the wound with sunscreen during the day for 1 full year. Use a sunscreen with an SPF of at least 30. Sunscreen helps to reduce the pigment that will form in the scar. When applying sunscreen to a completely healed wound, massage the scar for a few minutes to help reduce the appearance of the scar. Use circular motions with your fingertips, on and around the scar. Do not massage a healing wound. HOME  CARE INSTRUCTIONS For sutures:  Keep the wound clean and dry.   If you were given a bandage (dressing), you should change it at least once a day. Also change the dressing if it becomes wet or dirty, or as directed by your caregiver.   Wash the wound with soap and water 2 times a day. Rinse the wound off with water to remove all soap. Pat the wound dry with a clean towel.   After cleaning, apply a thin layer of the antibiotic ointment recommended by your caregiver. This will help prevent infection and keep the dressing from sticking.   You may shower as usual after the first 24 hours. Do not soak the wound in water until the sutures are removed.   Only take over-the-counter or prescription medicines for pain, discomfort, or fever as directed by your caregiver.   Get your sutures removed as directed by your caregiver. With facial lacerations, sutures should usually be taken out after 4 to 5 days to avoid stitch marks.   Wait a few days after your sutures are removed before applying makeup.  For skin adhesive strips:  Keep the wound clean and dry.   Do not get the skin adhesive strips wet. You may bathe carefully, using caution to keep the wound dry.   If the wound gets wet, pat it dry with a clean towel.   Skin adhesive strips will fall off on their own. You may trim the strips as the wound heals. Do not remove skin adhesive strips  that are still stuck to the wound. They will fall off in time.  For wound adhesive:  You may briefly wet your wound in the shower or bath. Do not soak or scrub the wound. Do not swim. Avoid periods of heavy perspiration until the skin adhesive has fallen off on its own. After showering or bathing, gently pat the wound dry with a clean towel.   Do not apply liquid medicine, cream medicine, ointment medicine, or makeup to your wound while the skin adhesive is in place. This may loosen the film before your wound is healed.   If a dressing is placed over the  wound, be careful not to apply tape directly over the skin adhesive. This may cause the adhesive to be pulled off before the wound is healed.   Avoid prolonged exposure to sunlight or tanning lamps while the skin adhesive is in place. Exposure to ultraviolet light in the first year will darken the scar.   The skin adhesive will usually remain in place for 5 to 10 days, then naturally fall off the skin. Do not pick at the adhesive film.  You may need a tetanus shot if:  You cannot remember when you had your last tetanus shot.   You have never had a tetanus shot.  If you get a tetanus shot, your arm may swell, get red, and feel warm to the touch. This is common and not a problem. If you need a tetanus shot and you choose not to have one, there is a rare chance of getting tetanus. Sickness from tetanus can be serious. SEEK IMMEDIATE MEDICAL CARE IF:  You develop redness, pain, or swelling around the wound.   There is yellowish-white fluid (pus) coming from the wound.   You develop chills or a fever.  MAKE SURE YOU:  Understand these instructions.   Will watch your condition.   Will get help right away if you are not doing well or get worse.  Document Released: 08/29/2004 Document Revised: 07/11/2011 Document Reviewed: 01/14/2011 [facility] Patient Information 2012 [place], [place].   Laceration Care, Adult A laceration is a cut or lesion that goes through all layers of the skin and into the tissue just beneath the skin. TREATMENT  Some lacerations may not require closure. Some lacerations may not be able to be closed due to an increased risk of infection. It is important to see your caregiver as soon as possible after an injury to minimize the risk of infection and maximize the opportunity for successful closure. If closure is appropriate, pain medicines may be given, if needed. The wound will be cleaned to help prevent infection. Your caregiver will use stitches (sutures), staples, wound  glue (adhesive), or skin adhesive strips to repair the laceration. These tools bring the skin edges together to allow for faster healing and a better cosmetic outcome. However, all wounds will heal with a scar. Once the wound has healed, scarring can be minimized by covering the wound with sunscreen during the day for 1 full year. HOME CARE INSTRUCTIONS  For sutures or staples:  Keep the wound clean and dry.   If you were given a bandage (dressing), you should change it at least once a day. Also, change the dressing if it becomes wet or dirty, or as directed by your caregiver.   Wash the wound with soap and water 2 times a day. Rinse the wound off with water to remove all soap. Pat the wound dry with a clean towel.  After cleaning, apply a thin layer of the antibiotic ointment as recommended by your caregiver. This will help prevent infection and keep the dressing from sticking.   You may shower as usual after the first 24 hours. Do not soak the wound in water until the sutures are removed.   Only take over-the-counter or prescription medicines for pain, discomfort, or fever as directed by your caregiver.   Get your sutures or staples removed as directed by your caregiver.  For skin adhesive strips:  Keep the wound clean and dry.   Do not get the skin adhesive strips wet. You may bathe carefully, using caution to keep the wound dry.   If the wound gets wet, pat it dry with a clean towel.   Skin adhesive strips will fall off on their own. You may trim the strips as the wound heals. Do not remove skin adhesive strips that are still stuck to the wound. They will fall off in time.  For wound adhesive:  You may briefly wet your wound in the shower or bath. Do not soak or scrub the wound. Do not swim. Avoid periods of heavy perspiration until the skin adhesive has fallen off on its own. After showering or bathing, gently pat the wound dry with a clean towel.   Do not apply liquid medicine,  cream medicine, or ointment medicine to your wound while the skin adhesive is in place. This may loosen the film before your wound is healed.   If a dressing is placed over the wound, be careful not to apply tape directly over the skin adhesive. This may cause the adhesive to be pulled off before the wound is healed.   Avoid prolonged exposure to sunlight or tanning lamps while the skin adhesive is in place. Exposure to ultraviolet light in the first year will darken the scar.   The skin adhesive will usually remain in place for 5 to 10 days, then naturally fall off the skin. Do not pick at the adhesive film.  You may need a tetanus shot if:  You cannot remember when you had your last tetanus shot.   You have never had a tetanus shot.  If you get a tetanus shot, your arm may swell, get red, and feel warm to the touch. This is common and not a problem. If you need a tetanus shot and you choose not to have one, there is a rare chance of getting tetanus. Sickness from tetanus can be serious. SEEK MEDICAL CARE IF:   You have redness, swelling, or increasing pain in the wound.   You see a red line that goes away from the wound.   You have yellowish-white fluid (pus) coming from the wound.   You have a fever.   You notice a bad smell coming from the wound or dressing.   Your wound breaks open before or after sutures have been removed.   You notice something coming out of the wound such as wood or glass.   Your wound is on your hand or foot and you cannot move a finger or toe.  SEEK IMMEDIATE MEDICAL CARE IF:   Your pain is not controlled with prescribed medicine.   You have severe swelling around the wound causing pain and numbness or a change in color in your arm, hand, leg, or foot.   Your wound splits open and starts bleeding.   You have worsening numbness, weakness, or loss of function of any joint around or beyond the  wound.   You develop painful lumps near the wound or on the  skin anywhere on your body.  MAKE SURE YOU:   Understand these instructions.   Will watch your condition.   Will get help right away if you are not doing well or get worse.  Document Released: 07/22/2005 Document Revised: 07/11/2011 Document Reviewed: 01/15/2011 [facility] Patient Information 2012 [place], [place].

## 2011-11-18 NOTE — ED Provider Notes (Signed)
History     CSN: [id number]  Arrival date & time 11/17/11  2358   First MD Initiated Contact with Patient 11/18/11 0037      Chief Complaint  Patient presents with  . Head Laceration    HPI  History provided by the patient. Patient is a 56 year old male with history of hypertension who presents with 4 head laceration prior to arrival. Patient states he was asleep in bed and awoke up after rolling off and hitting 4 head on the nightstand. Patient reports having bleeding from laceration down face. Bleeding was controlled with pressure. Patient denies any other symptoms. Patient states he was unsure if he should come to emergency room or not but decided due to the bleeding he should, and get checked out. She denies any episodes of nausea vomiting. He denies any vision problems. He denies any focal neuro deficits. No difficulty with speech, confusion, numbness, tingling or paralysis in extremities. Patient does not take any blood or medications. He has no history of bleeding disorders or easy bruising. Symptoms are described as mild. There are no other aggravating or alleviating factors.     Past Medical History  Diagnosis Date  . Hypertension     History reviewed. No pertinent past surgical history.  No family history on file.  History  Substance Use Topics  . Smoking status: Never Smoker   . Smokeless tobacco: Not on file  . Alcohol Use: Yes      Review of Systems  HENT: Negative for neck pain and neck stiffness.   Eyes: Negative for visual disturbance.  Neurological: Positive for headaches. Negative for dizziness, weakness, light-headedness and numbness.    Allergies  Penicillins  Home Medications   Current Outpatient Rx  Name Route Sig Dispense Refill  . LOSARTAN POTASSIUM 50 MG PO TABS Oral Take 50 mg by mouth daily.    Marland Kitchen OMEPRAZOLE MAGNESIUM 20 MG PO TBEC Oral Take 20 mg by mouth daily.    Marland Kitchen SIMVASTATIN 20 MG PO TABS Oral Take 20 mg by mouth every evening.       BP 156/96  Pulse 85  Temp(Src) 97.7 F (36.5 C) (Oral)  Resp 20  SpO2 98%  Physical Exam  Nursing note and vitals reviewed. Constitutional: He is oriented to person, place, and time. He appears well-developed and well-nourished.  HENT:  Head: Normocephalic.  Mouth/Throat: Oropharynx is clear and moist.       3.4 cm irregular laceration to middle 4 head. No significant hematoma. No battle sign or raccoon eyes.  Eyes: Conjunctivae are normal. Pupils are equal, round, and reactive to light.  Neck: Normal range of motion. Neck supple.       No cervical midline tenderness.  Cardiovascular: Normal rate and regular rhythm.   Pulmonary/Chest: Effort normal and breath sounds normal.  Neurological: He is alert and oriented to person, place, and time. He has normal strength. No sensory deficit.  Skin: Skin is warm.  Psychiatric: He has a normal mood and affect. His behavior is normal.    ED Course  Procedures   LACERATION REPAIR Performed by: Angus Seller Authorized by: Angus Seller Consent: Verbal consent obtained. Risks and benefits: risks, benefits and alternatives were discussed Consent given by: patient Patient identity confirmed: provided demographic data Prepped and Draped in normal sterile fashion Wound explored  Laceration Location: forehead  Laceration Length: 3.4 cm  No Foreign Bodies seen or palpated  Anesthesia: local infiltration  Local anesthetic: lidocaine 2% with epinephrine  Anesthetic total: 2  ml  Irrigation method: syringe Amount of cleaning: standard  Skin closure: Skin with 6-0 nylon   Number of sutures: 4   Technique: Simple interrupted   Patient tolerance: Patient tolerated the procedure well with no immediate complications.    1. Forehead laceration       MDM  12:50 AM he seen and evaluated. Patient no acute distress. Patient with no significant complaints at this time. Minor head injury with no episodes of loss  consciousness, nausea vomiting or focal neural deficits.        Angus Seller, [place] 11/18/11 [phone]

## 2011-11-18 NOTE — ED Notes (Signed)
Pt states he rolled out of bed and hit head on rail. 1 inch LAC on forehead

## 2011-11-18 NOTE — ED Notes (Signed)
The pt rolled out of bed and struck his head on the bed board.  1" lac to the middle of his forehead.  No active bleeding at present.  The pt was asleep when it occurred

## 2011-11-18 NOTE — ED Provider Notes (Signed)
Medical screening examination/treatment/procedure(s) were performed by non-physician practitioner and as supervising physician I was immediately available for consultation/collaboration.   Eveleigh Crumpler A Adalie Mand, MD 11/18/11 0641 

## 2011-12-12 ENCOUNTER — Other Ambulatory Visit (INDEPENDENT_AMBULATORY_CARE_PROVIDER_SITE_OTHER): Payer: BC Managed Care – PPO

## 2011-12-12 ENCOUNTER — Encounter: Payer: Self-pay | Admitting: Internal Medicine

## 2011-12-12 ENCOUNTER — Ambulatory Visit (INDEPENDENT_AMBULATORY_CARE_PROVIDER_SITE_OTHER): Payer: BC Managed Care – PPO | Admitting: Internal Medicine

## 2011-12-12 VITALS — BP 134/90 | HR 82 | Temp 97.8°F | Resp 16 | Wt 194.0 lb

## 2011-12-12 DIAGNOSIS — R030 Elevated blood-pressure reading, without diagnosis of hypertension: Secondary | ICD-10-CM

## 2011-12-12 DIAGNOSIS — Z0000 Encounter for general adult medical examination without abnormal findings: Secondary | ICD-10-CM

## 2011-12-12 DIAGNOSIS — E785 Hyperlipidemia, unspecified: Secondary | ICD-10-CM

## 2011-12-12 LAB — LIPID PANEL
LDL Cholesterol: 94 mg/dL (ref 0–99)
Total CHOL/HDL Ratio: 3
Triglycerides: 125 mg/dL (ref 0.0–149.0)

## 2011-12-12 LAB — COMPREHENSIVE METABOLIC PANEL
ALT: 33 U/L (ref 0–53)
CO2: 30 mEq/L (ref 19–32)
Calcium: 9.4 mg/dL (ref 8.4–10.5)
Chloride: 101 mEq/L (ref 96–112)
Creatinine, Ser: 1.1 mg/dL (ref 0.4–1.5)
GFR: 76.23 mL/min (ref >60.00)
Glucose, Bld: 92 mg/dL (ref 70–99)
Sodium: 140 mEq/L (ref 135–145)
Total Bilirubin: 1.2 mg/dL (ref 0.3–1.2)
Total Protein: 7.5 g/dL (ref 6.0–8.3)

## 2011-12-12 LAB — HEPATIC FUNCTION PANEL
AST: 29 U/L (ref 0–37)
Albumin: 4.5 g/dL (ref 3.5–5.2)
Alkaline Phosphatase: 53 U/L (ref 39–117)
Total Bilirubin: 1.2 mg/dL (ref 0.3–1.2)

## 2011-12-12 MED ORDER — LOSARTAN POTASSIUM 50 MG PO TABS: 50.0000 mg | ORAL_TABLET | Freq: Every day | ORAL | Status: DC

## 2011-12-12 MED ORDER — SIMVASTATIN 20 MG PO TABS: 20.0000 mg | ORAL_TABLET | Freq: Every evening | ORAL | Status: DC

## 2011-12-12 MED ORDER — TAMSULOSIN HCL 0.4 MG PO CAPS: 0.4000 mg | ORAL_CAPSULE | Freq: Every day | ORAL | Status: DC

## 2011-12-12 NOTE — Progress Notes (Signed)
Subjective:    Patient ID: Ronald Thompson, male    DOB: Sep 11, 1955, 56 y.o.   MRN: [id number]  HPI    Review of Systems Constitutional:  Negative for fever, chills, activity change and unexpected weight change.  HEENT:  Negative for hearing loss, ear pain, congestion, neck stiffness and postnasal drip. Negative for sore throat or swallowing problems. Negative for dental complaints.   Eyes: Negative for vision loss or change in visual acuity.  Respiratory: Negative for chest tightness and wheezing. Negative for DOE.   Cardiovascular: Negative for chest pain or palpitations. No decreased exercise tolerance Gastrointestinal: No change in bowel habit. No bloating or gas. No reflux or indigestion Genitourinary: Negative for urgency, frequency, flank pain and difficulty urinating. Positive for slow stream, nocturia q 2hrs Musculoskeletal: Negative for myalgias, back pain, arthralgias and gait problem.  Neurological: Negative for dizziness, tremors, weakness and headaches.  Hematological: Negative for adenopathy.  Psychiatric/Behavioral: Negative for behavioral problems and dysphoric mood.       Objective:   Physical Exam Filed Vitals:   12/12/11 1328  BP: 134/90  Pulse: 82  Temp: 97.8 F (36.6 C)  Resp: 16   Wt Readings from Last 3 Encounters:  12/12/11 194 lb (87.998 kg)  06/25/10 190 lb (86.183 kg)  10/14/08 196 lb (88.905 kg)     Gen'l: Well nourished well developed white male in no acute distress  HEENT: Head: Normocephalic and atraumatic. Right Ear: External ear normal. Cerumen impaction. Left Ear: External ear normal.  cerumen. Nose: Nose normal. Mouth/Throat: Oropharynx is clear and moist. Dentition - native, in good repair. No buccal or palatal lesions. Posterior pharynx clear. Eyes: Conjunctivae and sclera clear. EOM intact. Pupils are equal, round, and reactive to light. Right eye exhibits no discharge. Left eye exhibits no discharge. Neck: Normal range of motion. Neck  supple. No JVD present. No tracheal deviation present. No thyromegaly present.  Cardiovascular: Normal rate, regular rhythm, no gallop, no friction rub, no murmur heard.      Quiet precordium. 2+ radial and DP pulses . No carotid bruits Pulmonary/Chest: Effort normal. No respiratory distress or increased WOB, no wheezes, no rales. No chest wall deformity or CVAT. Abdominal: Soft. Bowel sounds are normal in all quadrants. He exhibits no distension, no tenderness, no rebound or guarding, No heptosplenomegaly  Genitourinary:  No penile shaft, normal testicles w/o mass. Prostate - NST, nl prostate w/o nodules Musculoskeletal: Normal range of motion. He exhibits no edema and no tenderness.       Small and large joints without redness, synovial thickening or deformity. Full range of motion preserved about all small, median and large joints.  Lymphadenopathy:    He has no cervical or supraclavicular adenopathy.  Neurological: He is alert and oriented to person, place, and time. CN II-XII intact. DTRs 2+ and symmetrical biceps, radial and patellar tendons. Cerebellar function normal with no tremor, rigidity, normal gait and station.  Skin: Skin is warm and dry. No rash noted. No erythema.  Psychiatric: He has a normal mood and affect. His behavior is normal. Thought content normal.   Lab Results  Component Value Date   WBC 7.0 06/21/2010   HGB 16.8 06/21/2010   HCT 48.2 06/21/2010   PLT 239.0 06/21/2010   GLUCOSE 92 12/12/2011   CHOL 173 12/12/2011   TRIG 125.0 12/12/2011   HDL 54.40 12/12/2011   LDLDIRECT 139.2 10/10/2008   LDLCALC 94 12/12/2011   ALT 33 12/12/2011   ALT 33 12/12/2011   AST 29 12/12/2011  AST 29 12/12/2011   NA 140 12/12/2011   K 4.2 12/12/2011   CL 101 12/12/2011   CREATININE 1.1 12/12/2011   BUN 16 12/12/2011   CO2 30 12/12/2011   TSH 1.31 06/21/2010   PSA 1.14 06/21/2010          Assessment & Plan:

## 2011-12-15 DIAGNOSIS — Z0000 Encounter for general adult medical examination without abnormal findings: Secondary | ICD-10-CM | POA: Insufficient documentation

## 2011-12-15 NOTE — Assessment & Plan Note (Signed)
Lab results are great: LDL better than goal of 100 or less; HDL better than goal of 40 or greater.  Plan Continue present regimen

## 2011-12-15 NOTE — Assessment & Plan Note (Signed)
Interval medical history is unremarkable. Physical exam is normal. Labs look fine. He is current for colorectal cancer screening. Based on USPHTF he will forego prostate screening.  In summary - a very nice man who is working perhaps too hard: not getting adequate sleep or making time for exercise. He is advised to try to sleep 6+ hours/night and to exercise at lease 3 days per week.

## 2011-12-15 NOTE — Assessment & Plan Note (Signed)
BP Readings from Last 3 Encounters:  12/12/11 134/90  11/18/11 156/96  06/25/10 138/98   Borderline BP today in office.  Plan Home monitroing of blood pressure - call if sBP greater than 140 ona a consistent basis.

## 2011-12-19 ENCOUNTER — Encounter: Payer: Self-pay | Admitting: Internal Medicine

## 2011-12-19 ENCOUNTER — Ambulatory Visit (INDEPENDENT_AMBULATORY_CARE_PROVIDER_SITE_OTHER): Payer: BC Managed Care – PPO | Admitting: Internal Medicine

## 2011-12-19 VITALS — BP 114/80 | HR 98 | Temp 97.3°F | Ht 70.0 in | Wt 196.5 lb

## 2011-12-19 DIAGNOSIS — J209 Acute bronchitis, unspecified: Secondary | ICD-10-CM

## 2011-12-19 MED ORDER — AZITHROMYCIN 250 MG PO TABS: ORAL_TABLET | ORAL | Status: AC

## 2011-12-19 NOTE — Progress Notes (Signed)
  Subjective:    Patient ID: Ronald Thompson, male    DOB: January 10, 1956, 56 y.o.   MRN: [id number]  HPI  Here with acute onset mild to mod 2-3 days ST, HA, general weakness and malaise, with prod cough greenish sputum, but Pt denies chest pain, increased sob or doe, wheezing, orthopnea, PND, increased LE swelling, palpitations, dizziness or syncope. Seems to get this every yr about this time.  No signficant allergy or asthma recent symptoms.   Pt denies polydipsia, polyuria.  Past Medical History  Diagnosis Date  . 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 Baker's cyst    Past Surgical History  Procedure Date  . Thoracotomy 1990    wedge biopsy of mediastinal mass    reports that he has never smoked. He does not have any smokeless tobacco history on file. He reports that he drinks alcohol. His drug history not on file. family history includes Cancer in his maternal aunt and paternal grandfather; Dementia in his mother; and Emphysema in his maternal aunt. Allergies  Allergen Reactions  . Penicillins    Current Outpatient Prescriptions on File Prior to Visit  Medication Sig Dispense Refill  . losartan (COZAAR) 50 MG tablet Take 1 tablet (50 mg total) by mouth daily.  30 tablet  11  . omeprazole (PRILOSEC OTC) 20 MG tablet Take 20 mg by mouth daily.      . simvastatin (ZOCOR) 20 MG tablet Take 1 tablet (20 mg total) by mouth every evening.  30 tablet  11  . Tamsulosin HCl (FLOMAX) 0.4 MG CAPS Take 1 capsule (0.4 mg total) by mouth daily.  10 capsule  0   Review of Systems All otherwise neg per pt     Objective:   Physical Exam BP 114/80  Pulse 98  Temp(Src) 97.3 F (36.3 C) (Oral)  Ht 5\' 10"  (1.778 m)  Wt 196 lb 8 oz (89.132 kg)  BMI 28.19 kg/m2  SpO2 93% Physical Exam  VS noted Constitutional: Pt appears well-developed and well-nourished.  HENT: Head: Normocephalic.  Right Ear: External ear normal.  Left  Ear: External ear normal.  Bilat tm's mild erythema.  Sinus nontender.  Pharynx mild erythema Eyes: Conjunctivae and EOM are normal. Pupils are equal, round, and reactive to light.  Neck: Normal range of motion. Neck supple.  Cardiovascular: Normal rate and regular rhythm.   Pulmonary/Chest: Effort normal and breath sounds normal. except for some mild decreased BS left chest, no frank wheeze or rales Neurological: Pt is alert. Not confused Skin: Skin is warm. No erythema.  Psychiatric: Pt behavior is normal. Thought content normal. not nervous or depressed affefct    Assessment & Plan:

## 2011-12-19 NOTE — Patient Instructions (Addendum)
Take all new medications as prescribed Continue all other medications as before You can also take Delsym OTC for cough, and/or Mucinex (or it's generic off brand) for congestion  

## 2011-12-19 NOTE — Assessment & Plan Note (Signed)
Mild to mod, for antibx course,  to f/u any worsening symptoms or concerns 

## 2012-08-27 ENCOUNTER — Ambulatory Visit (INDEPENDENT_AMBULATORY_CARE_PROVIDER_SITE_OTHER)
Admission: RE | Admit: 2012-08-27 | Discharge: 2012-08-27 | Disposition: A | Discharge status: Home / Self Care | Payer: BC Managed Care – PPO | Source: Ambulatory Visit | Attending: Internal Medicine | Admitting: Internal Medicine

## 2012-08-27 ENCOUNTER — Encounter: Payer: Self-pay | Admitting: Internal Medicine

## 2012-08-27 ENCOUNTER — Ambulatory Visit (INDEPENDENT_AMBULATORY_CARE_PROVIDER_SITE_OTHER): Payer: BC Managed Care – PPO | Admitting: Internal Medicine

## 2012-08-27 VITALS — BP 128/70 | HR 77 | Temp 99.4°F | Resp 10 | Wt 191.0 lb

## 2012-08-27 DIAGNOSIS — M5432 Sciatica, left side: Secondary | ICD-10-CM

## 2012-08-27 DIAGNOSIS — M5430 Sciatica, unspecified side: Secondary | ICD-10-CM

## 2012-08-27 NOTE — Patient Instructions (Addendum)
Sciatica - mild nerve root compression left L3-4,4-5.  Plan X-rays today of the lumbar spine to set a baseline in regard to any arthritic or disk disease that could be work here  Flex stretch exercise: go to [email or web address] enter sciatica and stretch and review and select exercises that work for you  Over the counter anti-inflammatory drugs, e.g. Aleve, motrin, to take when you have acute pain.   Sciatica Sciatica is pain, weakness, numbness, or tingling along the path of the sciatic nerve. The nerve starts in the lower back and runs down the back of each leg. The nerve controls the muscles in the lower leg and in the back of the knee, while also providing sensation to the back of the thigh, lower leg, and the sole of your foot. Sciatica is a symptom of another medical condition. For instance, nerve damage or certain conditions, such as a herniated disk or bone spur on the spine, pinch or put pressure on the sciatic nerve. This causes the pain, weakness, or other sensations normally associated with sciatica. Generally, sciatica only affects one side of the body. CAUSES    Herniated or slipped disc.   Degenerative disk disease.   A pain disorder involving the narrow muscle in the buttocks (piriformis syndrome).   Pelvic injury or fracture.   Pregnancy.   Tumor (rare).  SYMPTOMS   Symptoms can vary from mild to very severe. The symptoms usually travel from the low back to the buttocks and down the back of the leg. Symptoms can include:  Mild tingling or dull aches in the lower back, leg, or hip.   Numbness in the back of the calf or sole of the foot.   Burning sensations in the lower back, leg, or hip.   Sharp pains in the lower back, leg, or hip.   Leg weakness.   Severe back pain inhibiting movement.  These symptoms may get worse with coughing, sneezing, laughing, or prolonged sitting or standing. Also, being overweight may worsen symptoms. DIAGNOSIS   Your caregiver will perform a  physical exam to look for common symptoms of sciatica. He or she may ask you to do certain movements or activities that would trigger sciatic nerve pain. Other tests may be performed to find the cause of the sciatica. These may include:  Blood tests.   X-rays.   Imaging tests, such as an MRI or CT scan.  TREATMENT   Treatment is directed at the cause of the sciatic pain. Sometimes, treatment is not necessary and the pain and discomfort goes away on its own. If treatment is needed, your caregiver may suggest:  Over-the-counter medicines to relieve pain.   Prescription medicines, such as anti-inflammatory medicine, muscle relaxants, or narcotics.   Applying heat or ice to the painful area.   Steroid injections to lessen pain, irritation, and inflammation around the nerve.   Reducing activity during periods of pain.   Exercising and stretching to strengthen your abdomen and improve flexibility of your spine. Your caregiver may suggest losing weight if the extra weight makes the back pain worse.   Physical therapy.   Surgery to eliminate what is pressing or pinching the nerve, such as a bone spur or part of a herniated disk.  HOME CARE INSTRUCTIONS    Only take over-the-counter or prescription medicines for pain or discomfort as directed by your caregiver.   Apply ice to the affected area for 20 minutes, 3 4 times a day for the first 48 72 hours. Then  try heat in the same way.   Exercise, stretch, or perform your usual activities if these do not aggravate your pain.   Attend physical therapy sessions as directed by your caregiver.   Keep all follow-up appointments as directed by your caregiver.   Do not wear high heels or shoes that do not provide proper support.   Check your mattress to see if it is too soft. A firm mattress may lessen your pain and discomfort.  SEEK IMMEDIATE MEDICAL CARE IF:    You lose control of your bowel or bladder (incontinence).   You have increasing  weakness in the lower back, pelvis, buttocks, or legs.   You have redness or swelling of your back.   You have a burning sensation when you urinate.   You have pain that gets worse when you lie down or awakens you at night.   Your pain is worse than you have experienced in the past.   Your pain is lasting longer than 4 weeks.   You are suddenly losing weight without reason.  MAKE SURE YOU:  Understand these instructions.   Will watch your condition.   Will get help right away if you are not doing well or get worse.  Document Released: 07/16/2001 Document Revised: 01/21/2012 Document Reviewed: 12/01/2011 [facility] Patient Information 2013 [place], [place].

## 2012-08-30 ENCOUNTER — Encounter: Payer: Self-pay | Admitting: Internal Medicine

## 2012-08-30 NOTE — Progress Notes (Signed)
Subjective:    Patient ID: Ronald Thompson, male    DOB: Aug 30, 1955, 57 y.o.   MRN: [id number]  HPI Mr. Hirt presents for evaluation of pain that starts at the left buttock and will radiate down the left leg. The symptoms have been present for about a month, are intermittent and of moderate severity. He denies any weakness in the leg, loss of sensation or loss of function. He has had no injury that he recalls. There is no prior h/o back pain or trouble.  He has had no problem with incontinence. He does have a remote history of testicular cancer that was fully treated w/o any signs of recurrence.  Past Medical History  Diagnosis Date  . 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 Baker's cyst    Past Surgical History  Procedure Date  . Thoracotomy 1990    wedge biopsy of mediastinal mass   Family History  Problem Relation Age of Onset  . Dementia Mother   . Cancer Maternal Aunt     colon  . Emphysema Maternal Aunt   . Cancer Paternal Grandfather     esophagus   History   Social History  . Marital Status: Married    Spouse Name: N/A    Number of Children: 3  . Years of Education: 16   Occupational History  . Banker    Social History Main Topics  . Smoking status: Never Smoker   . Smokeless tobacco: Never Used  . Alcohol Use: Yes  . Drug Use: No  . Sexually Active: Yes -- Male partner(s)   Other Topics Concern  . Not on file   Social History Narrative   Gala Lewandowsky. 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 2 sons: 109, 70- married with 2 daughters 1 in route: Oldest in [place], younger [place], [place]. 1 daughter: 65 [place], married-1 granddaughter.Psychologist, occupational- Energy manager. SO- good health; marriage in good health    Current Outpatient Prescriptions on File Prior to Visit  Medication Sig Dispense Refill  . losartan (COZAAR) 50 MG tablet Take 1 tablet (50 mg total) by mouth daily.  30 tablet  11  . simvastatin  (ZOCOR) 20 MG tablet Take 1 tablet (20 mg total) by mouth every evening.  30 tablet  11  . omeprazole (PRILOSEC OTC) 20 MG tablet Take 20 mg by mouth daily.      . Tamsulosin HCl (FLOMAX) 0.4 MG CAPS Take 1 capsule (0.4 mg total) by mouth daily.  10 capsule  0      Review of Systems System review is negative for any constitutional, cardiac, pulmonary, GI or neuro symptoms or complaints other than as described in the HPI.     Objective:   Physical Exam Filed Vitals:   08/27/12 1613  BP: 128/70  Pulse: 77  Temp: 99.4 F (37.4 C)  Resp: 10   Wt Readings from Last 3 Encounters:  08/27/12 191 lb 0.6 oz (86.655 kg)  12/19/11 196 lb 8 oz (89.132 kg)  12/12/11 194 lb (87.998 kg)   Gen'l- WNWD white man in no distress HEENT- C&S clear, PERRLA Neck- supple Cor- 2+ radial pulse, RRR Pulm - normal respirations Back exam: normal stand; normal flex to greater than 100 degrees; normal gait; normal toe-minimal back pain/heel walk-minimal back pain; normal step up to exam table; normal SLR sitting; normal DTRs at the patellar tendons; normal sensation to light touch, pin-prick and  deep vibratory stimulus; no  CVA tenderness; able to move supine to sitting witout assistance.  X-ray L-S spine: Findings: Vertebral body height and alignment are normal.  Intervertebral disc space height is maintained. No pars  interarticularis defect is identified. Paraspinous structures  unremarkable.  IMPRESSION:  Negative exam.       Assessment & Plan:  Sciatica - mild nerve root compression left L3-4,4-5.  Plan X-rays today of the lumbar spine to set a baseline in regard to any arthritic or disk disease that could be work here  Flex stretch exercise: go to [email or web address] enter sciatica and stretch and review and select exercises that work for you  Over the counter anti-inflammatory drugs, e.g. Aleve, motrin, to take when you have acute pain.

## 2012-11-04 ENCOUNTER — Telehealth: Payer: Self-pay | Admitting: Internal Medicine

## 2012-11-04 ENCOUNTER — Ambulatory Visit (INDEPENDENT_AMBULATORY_CARE_PROVIDER_SITE_OTHER): Payer: BC Managed Care – PPO | Admitting: Internal Medicine

## 2012-11-04 ENCOUNTER — Encounter: Payer: Self-pay | Admitting: Internal Medicine

## 2012-11-04 VITALS — BP 118/86 | HR 84 | Temp 98.1°F | Resp 12 | Ht 70.0 in | Wt 190.4 lb

## 2012-11-04 DIAGNOSIS — M5432 Sciatica, left side: Secondary | ICD-10-CM

## 2012-11-04 DIAGNOSIS — M5430 Sciatica, unspecified side: Secondary | ICD-10-CM

## 2012-11-04 MED ORDER — HYDROCODONE-ACETAMINOPHEN 5-325 MG PO TABS: 1.0000 | ORAL_TABLET | Freq: Four times a day (QID) | ORAL | Status: DC | PRN

## 2012-11-04 MED ORDER — PREDNISONE 10 MG PO TABS: 10.0000 mg | ORAL_TABLET | Freq: Every day | ORAL | Status: DC

## 2012-11-04 NOTE — Telephone Encounter (Signed)
Patient Information:  Caller Name: Wilkie  Phone: [phone]  Patient: Ronald Thompson, Ronald Thompson  Gender: Male  DOB: 11/07/1955  Age: 57 Years  PCP: Illene Regulus (Adults only)  Office Follow Up:  Does the office need to follow up with this patient?: No  Instructions For The Office: N/A  RN Note:  Pt following up regarding Back pain, orginally evaluated on 08-27-12.  Pt picked up flower pot on 10-22-12, felt sharp pain, shooting down Left Leg.  Pt has tried Advil, aleve, ice and heat, pain improves but comes right back.  Pt unable to walk more than 10 steps without pain becoming so severe that causes difficultly walking, must walk w/ limp.  Pt in [facility] requested last appt of the day.  Appt scheduled at 1615 on 4-2 w/ Dr Debby Bud.  Symptoms  Reason For Call & Symptoms: Follow up Low Back Pain worsen  Reviewed Health History In EMR: Yes  Reviewed Medications In EMR: Yes  Reviewed Allergies In EMR: Yes  Reviewed Surgeries / Procedures: Yes  Date of Onset of Symptoms: 08/27/2012  Treatments Tried: Advil, Aleve, ice and heat  Treatments Tried Worked: No  Guideline(s) Used:  Back Pain  Disposition Per Guideline:   See Today in Office  Reason For Disposition Reached:   Can't walk or can barely walk  Advice Given:  N/A  Patient Will Follow Care Advice:  YES  Appointment Scheduled:  11/04/2012 16:15:00 Appointment Scheduled Provider:  Illene Regulus (Adults only)

## 2012-11-04 NOTE — Patient Instructions (Addendum)
Low back pain with radiation to the leg very suggestive of sciatica - a pinched nerve is a real possibility from a herniated disk. On exam there is pain with standing, toe or heel walk hurts, step up to exam table hurts. Good news - normal reflexes, normal straight leg raise, normal sensation.  Plan Prednisone burst and taper: 4 tabs daily x 3 days, 3 tabs daily x 3 days, 2 tabs daily x 3 days, 1 tab daily x 6 days.  May take hydrocodone/APAP every 6 hours as needed.  Rest your back: if it hurts don't do it.  Alarm signs: increased pain, loss of muscle strength, persistent loss of feeling or persistence despite treatment - will lead to MRI.   Chiropractic - I am neutral. Do not let anyone do an MRI.   Herniated Disk The bones of your spinal column (vertebrae) protect your spinal cord and nerves that go into your arms and legs. The vertebrae are separated by disks that cushion the spinal column and put space between your vertebrae. This allows movement between the vertebrae, which allows you to bend, rotate, and move your body from side to side. Sometimes, the disks move out of place (herniate) or break open (rupture) from injury or strain. The most common area for a disk herniation is in the lower back (lumbar area). Sometimes herniation occurs in the neck (cervical) disks.  CAUSES  As we grow older, the strong, fibrous cords that connect the vertebrae and support and surround the disks (ligaments) start to weaken. A strain on the back may cause a break in the disk ligaments. RISK FACTORS Herniated disks occur most often in men who are aged 18 years to 35 years, usually after strenuous activity. Other risk factors include conditions present at birth (congenital) that affect the size of the lumbar spinal canal. Additionally, a narrowing of the areas where the nerves exit the spinal canal can occur as you age. SYMPTOMS  Symptoms of a herniated disk vary. You may have weakness in certain muscles. This  weakness can include difficulty lifting your leg or arm, difficulty standing on your toes on one side, or difficulty squeezing tightly with one of your hands. You may have numbness. You may feel a mild tingling, dull ache, or a burning or pulsating pain. In some cases, the pain is severe enough that you are unable to move. The pain most often occurs on one side of the body. The pain often starts slowly. It may get worse:  After you sit or stand.  At night.  When you sneeze, cough, or laugh.  When you bend backwards or walk more than a few yards. The pain, numbness, or weakness will often go away or improve a lot over a period of weeks to months. Herniated lumbar disk Symptoms of a herniated lumbar disk may include sharp pain in one part of your leg, hip, or buttocks and numbness in other parts. You also may feel pain or numbness on the back of your calf or the top or sole of your foot. The same leg also may feel weak. Herniated cervical disk Symptoms of a herniated cervical disk may include pain when you move your neck, deep pain near or over your shoulder blade, or pain that moves to your upper arm, forearm, or fingers. DIAGNOSIS  To diagnose a herniated disk, your caregiver will perform a physical exam. Your caregiver also may perform diagnostic tests to see your disk or to test the reaction of your muscles  and the function of your nerves. During the physical exam, your caregiver may ask you to:  Sit, stand, and walk. While you walk, your caregiver may ask you to try walking on your toes and then your heels.  Bend forward, backward, and sideways.  Raise your shoulders, elbow, wrist, and fingers and check your strength during these tasks. Your caregiver will check for:  Numbness or loss of feeling.  Muscle reflexes, which may be slower or missing.  Muscle strength, which may be weaker.  Posture or the way your spine curves. Diagnostic tests that may be done include:  A spinal  X-ray exam to rule out other causes of back pain.  Magnetic resonance imaging (MRI) or computed tomography (CT) scan, which will show if the herniated disk is pressing on your spinal canal.  Electromyography. This is sometimes used to identify the specific area of nerve involvement. TREATMENT  Initial treatment for a herniated disk is a short period of rest with medicines for pain. Pain medicines can include nonsteroidal anti-inflammatory medicines (NSAIDs), muscle relaxants for back spasms, and (rarely) narcotic pain medicine for severe pain that does not respond to NSAID use. Bed rest is often limited to 1 or 2 days at the most because prolonged rest can delay recovery. When the herniation involves the lower back, sitting should be avoided as much as possible because sitting increases pressure on the ruptured disk. Sometimes a soft neck collar will be prescribed for a few days to weeks to help support your neck in the case of a cervical herniation. Physical therapy is often prescribed for patients with disk disease. Physical therapists will teach you how to properly lift, dress, walk, and perform other activities. They will work on strengthening the muscles that help support your spine. In some cases, physical therapy alone is not enough to treat a herniated disk. Steroid injections along the involved nerve root may be needed to help control pain. The steroid is injected in the area of the herniated disk and helps by reducing swelling around the disk. Sometimes surgery is the best option to treat a herniated disk.  SEEK IMMEDIATE MEDICAL CARE IF:   You have numbness, tingling, weakness, or problems with the use of your arms or legs.  You have severe headaches that are not relieved with the use of medicines.  You notice a change in your bowel or bladder control.  You have increasing pain in any areas of your body.  You experience shortness of breath, dizziness, or fainting. MAKE SURE YOU:    Understand these instructions.  Will watch your condition.  Will get help right away if you are not doing well or get worse. Document Released: 07/19/2000 Document Revised: 10/14/2011 Document Reviewed: 02/22/2011 [facility] Patient Information 2013 [place], [place].

## 2012-11-05 ENCOUNTER — Telehealth: Payer: Self-pay | Admitting: Internal Medicine

## 2012-11-05 NOTE — Telephone Encounter (Signed)
Pt needed clarification on his prednisone instructions. Instruction given per Dr Debby Bud 11/04/12 office note. Pt expressed understanding and had no other concerns or questions.

## 2012-11-05 NOTE — Telephone Encounter (Signed)
Ronald Thompson, [name], [phone] called to question Prednisone instructions.  Seen 11/04/12; Dr Debby Bud provided verbal instructions for a Prednisone taper however instructions on Rx vial and Epic med list specify Prednisone 10 mg daily.  Please call to verify Prednisone instructions.

## 2012-11-07 NOTE — Progress Notes (Signed)
  Subjective:    Patient ID: Ronald Thompson, male    DOB: [birth year], 57 y.o.   MRN: [id number]  HPI Mfr. Zufall presents for evaluation of back pain. It started about 10 days ago. He was lifting a flower pot just before his daughter's wedding and felt a pull in his low back that was very sharp and seemed to go to his buttock. A couple of days later he was lifting the mattress to make the bed and felt a sharp pain that went down the left buttock and radiated down to his leg. Since that second episode he has intermittent pain down the buttock to the leg radiating down to the foot. He has not had dense paresthesia, focal weakness although he does report a little foot drag at times, no incontinence or bladder control problems . Going up stairs is difficulty in regard to pain.  PMH, FamHx and SocHx reviewed for any changes and relevance.  Current Outpatient Prescriptions on File Prior to Visit  Medication Sig Dispense Refill  . losartan (COZAAR) 50 MG tablet Take 1 tablet (50 mg total) by mouth daily.  30 tablet  11  . omeprazole (PRILOSEC OTC) 20 MG tablet Take 20 mg by mouth daily.      . simvastatin (ZOCOR) 20 MG tablet Take 1 tablet (20 mg total) by mouth every evening.  30 tablet  11  . Tamsulosin HCl (FLOMAX) 0.4 MG CAPS Take 1 capsule (0.4 mg total) by mouth daily.  10 capsule  0   No current facility-administered medications on file prior to visit.      Review of Systems System review is negative for any constitutional, cardiac, pulmonary, GI or neuro symptoms or complaints other than as described in the HPI.     Objective:   Physical Exam Filed Vitals:   11/04/12 1630  BP: 118/86  Pulse: 84  Temp: 98.1 F (36.7 C)  Resp: 12   Cor- 2+ radial pulse Pulm - normal respirations. Back exam: normal stand; normal flex to greater than 100 degrees; normal gait; discomfort on the left with toe/heel walk; normal step up to exam table but w/ some pain using left leg; normal SLR sitting; normal  DTRs at the patellar tendons; normal sensation to light touch, pin-prick and deep vibratory stimulus; no  CVA tenderness; able to move supine to sitting witout assistance.        Assessment & Plan:  Low back pain with radiation to the leg very suggestive of sciatica - a pinched nerve is a real possibility from a herniated disk. On exam there is pain with standing, toe or heel walk hurts, step up to exam table hurts. Good news - normal reflexes, normal straight leg raise, normal sensation.  Plan Prednisone burst and taper: 4 tabs daily x 3 days, 3 tabs daily x 3 days, 2 tabs daily x 3 days, 1 tab daily x 6 days.  May take hydrocodone/APAP every 6 hours as needed.  Rest your back: if it hurts don't do it.  Alarm signs: increased pain, loss of muscle strength, persistent loss of feeling or persistence despite treatment - will lead to MRI.   Chiropractic - I am neutral. Do not let anyone do an MRI.

## 2012-11-30 ENCOUNTER — Encounter: Payer: Self-pay | Admitting: Internal Medicine

## 2012-11-30 ENCOUNTER — Ambulatory Visit (INDEPENDENT_AMBULATORY_CARE_PROVIDER_SITE_OTHER): Payer: BC Managed Care – PPO | Admitting: Internal Medicine

## 2012-11-30 VITALS — BP 120/82 | HR 83 | Temp 97.4°F | Ht 70.0 in | Wt 193.2 lb

## 2012-11-30 DIAGNOSIS — IMO0002 Reserved for concepts with insufficient information to code with codable children: Secondary | ICD-10-CM

## 2012-11-30 DIAGNOSIS — R030 Elevated blood-pressure reading, without diagnosis of hypertension: Secondary | ICD-10-CM

## 2012-11-30 DIAGNOSIS — M5416 Radiculopathy, lumbar region: Secondary | ICD-10-CM

## 2012-11-30 NOTE — Progress Notes (Signed)
Subjective:    Patient ID: Ronald Thompson, male    DOB: January 24, 1956, 57 y.o.   MRN: [id number]  HPI  Hee with 3 months ongoing LBP, mod to severe, constant  Persistent, located primarily mid and leftlower back with some to buttock, and now often radiates to the distal LLE with numbness, and increased pain , possbily mild weakness with the worst pain, no falls. No bowel or bladder change, fever, wt loss, gait change or falls. In fact gained several lbs since jan. No longer can job, and walking by end of day uphill is very difficult. No prior hx of similar, Did take prednisone x 30 days which mahy have helped some for feb 2014 but now worse.  Vicodin not working as well, pain often up to 8/10. Pain essentially resolveds to sit, lyging down not always resolves, has to flex both hips while lying to improve.  Past Medical History  Diagnosis Date  . 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 Baker's cyst    Past Surgical History  Procedure Laterality Date  . Thoracotomy  1990    wedge biopsy of mediastinal mass    reports that he has never smoked. He has never used smokeless tobacco. He reports that  drinks alcohol. He reports that he does not use illicit drugs. family history includes Cancer in his maternal aunt and paternal grandfather; Dementia in his mother; and Emphysema in his maternal aunt. Allergies  Allergen Reactions  . Penicillins    Current Outpatient Prescriptions on File Prior to Visit  Medication Sig Dispense Refill  . HYDROcodone-acetaminophen (NORCO) 5-325 MG per tablet Take 1 tablet by mouth every 6 (six) hours as needed for pain.  30 tablet  1  . losartan (COZAAR) 50 MG tablet Take 1 tablet (50 mg total) by mouth daily.  30 tablet  11  . omeprazole (PRILOSEC OTC) 20 MG tablet Take 20 mg by mouth daily.      . simvastatin (ZOCOR) 20 MG tablet Take 1 tablet (20 mg total) by mouth every evening.  30 tablet  11   . Tamsulosin HCl (FLOMAX) 0.4 MG CAPS Take 1 capsule (0.4 mg total) by mouth daily.  10 capsule  0   No current facility-administered medications on file prior to visit.   Review of Systems  Constitutional: Negative for unexpected weight change, or unusual diaphoresis  HENT: Negative for tinnitus.   Eyes: Negative for photophobia and visual disturbance.  Respiratory: Negative for choking and stridor.   Gastrointestinal: Negative for vomiting and blood in stool.  Genitourinary: Negative for hematuria and decreased urine volume.  Musculoskeletal: Negative for acute joint swelling Skin: Negative for color change and wound.  Neurological: Negative for tremors and numbness other than noted  Psychiatric/Behavioral: Negative for decreased concentration or  hyperactivity.       Objective:   Physical Exam BP 120/82  Pulse 83  Temp(Src) 97.4 F (36.3 C) (Oral)  Ht 5\' 10"  (1.778 m)  Wt 193 lb 4 oz (87.658 kg)  BMI 27.73 kg/m2  SpO2 93% VS noted,  Constitutional: Pt appears well-developed and well-nourished.  HENT: Head: NCAT.  Right Ear: External ear normal.  Left Ear: External ear normal.  Eyes: Conjunctivae and EOM are normal. Pupils are equal, round, and reactive to light.  Neck: Normal range of motion. Neck supple.  Cardiovascular: Normal rate and regular rhythm.   Pulmonary/Chest: Effort normal  and breath sounds normal.  Abd:  Soft, NT, non-distended, + BS Spine: nontender, buttock nontender Neurological: Pt is alert. Not confused .motor 4+/5 LLE weakness, sens/dtr intact Skin: Skin is warm. No erythema.  Psychiatric: Pt behavior is normal. Thought content normal.     Assessment & Plan:

## 2012-11-30 NOTE — Patient Instructions (Signed)
Please continue all other medications as before, and refills have been done if requested. You will be contacted regarding the referral for: MRI for the lower back, and Referral to Neurosurgury Please continue all other medications as before, and refills have been done if requested. Thank you for enrolling in MyChart. Please follow the instructions below to securely access your online medical record. MyChart allows you to send messages to your doctor, view your test results, renew your prescriptions, schedule appointments, and more.

## 2012-11-30 NOTE — Assessment & Plan Note (Signed)
Has gained several lbs, but BP remains ok, to cont efforts at wt control

## 2012-12-01 ENCOUNTER — Ambulatory Visit
Admission: RE | Admit: 2012-12-01 | Discharge: 2012-12-01 | Disposition: A | Discharge status: Home / Self Care | Payer: BC Managed Care – PPO | Source: Ambulatory Visit | Attending: Internal Medicine | Admitting: Internal Medicine

## 2012-12-01 DIAGNOSIS — M5416 Radiculopathy, lumbar region: Secondary | ICD-10-CM

## 2013-01-14 ENCOUNTER — Other Ambulatory Visit: Payer: Self-pay | Admitting: Internal Medicine

## 2013-01-25 ENCOUNTER — Other Ambulatory Visit: Payer: Self-pay

## 2013-01-25 ENCOUNTER — Other Ambulatory Visit: Payer: Self-pay | Admitting: Internal Medicine

## 2013-01-25 MED ORDER — SIMVASTATIN 20 MG PO TABS: 20.0000 mg | ORAL_TABLET | Freq: Every day | ORAL | Status: DC

## 2013-06-10 ENCOUNTER — Other Ambulatory Visit: Payer: Self-pay

## 2014-02-01 ENCOUNTER — Other Ambulatory Visit: Payer: Self-pay

## 2014-02-01 MED ORDER — SIMVASTATIN 20 MG PO TABS: 20.0000 mg | ORAL_TABLET | Freq: Every day | ORAL | Status: DC

## 2014-02-01 NOTE — Telephone Encounter (Signed)
Fax from [facility] stating they did not receive  The rx for Simvastatin

## 2014-02-03 ENCOUNTER — Encounter: Payer: Self-pay | Admitting: Internal Medicine

## 2014-02-03 ENCOUNTER — Ambulatory Visit (INDEPENDENT_AMBULATORY_CARE_PROVIDER_SITE_OTHER): Payer: BC Managed Care – PPO | Admitting: Internal Medicine

## 2014-02-03 ENCOUNTER — Other Ambulatory Visit (INDEPENDENT_AMBULATORY_CARE_PROVIDER_SITE_OTHER): Payer: BC Managed Care – PPO

## 2014-02-03 VITALS — BP 120/80 | HR 77 | Temp 97.8°F | Ht 70.0 in | Wt 190.5 lb

## 2014-02-03 DIAGNOSIS — Z8619 Personal history of other infectious and parasitic diseases: Secondary | ICD-10-CM | POA: Insufficient documentation

## 2014-02-03 DIAGNOSIS — Z0000 Encounter for general adult medical examination without abnormal findings: Secondary | ICD-10-CM

## 2014-02-03 DIAGNOSIS — I350 Nonrheumatic aortic (valve) stenosis: Secondary | ICD-10-CM | POA: Insufficient documentation

## 2014-02-03 DIAGNOSIS — I359 Nonrheumatic aortic valve disorder, unspecified: Secondary | ICD-10-CM

## 2014-02-03 DIAGNOSIS — H9193 Unspecified hearing loss, bilateral: Secondary | ICD-10-CM | POA: Insufficient documentation

## 2014-02-03 DIAGNOSIS — H9190 Unspecified hearing loss, unspecified ear: Secondary | ICD-10-CM

## 2014-02-03 LAB — HEPATIC FUNCTION PANEL
ALT: 35 U/L (ref 0–53)
AST: 30 U/L (ref 0–37)
Albumin: 4.5 g/dL (ref 3.5–5.2)
Alkaline Phosphatase: 59 U/L (ref 39–117)
BILIRUBIN TOTAL: 1.4 mg/dL — AB (ref 0.2–1.2)
Bilirubin, Direct: 0.2 mg/dL (ref 0.0–0.3)
Total Protein: 7.2 g/dL (ref 6.0–8.3)

## 2014-02-03 LAB — LIPID PANEL
CHOL/HDL RATIO: 3
Cholesterol: 178 mg/dL (ref 0–200)
HDL: 51.6 mg/dL (ref >39.00)
LDL CALC: 114 mg/dL — AB (ref 0–99)
NonHDL: 126.4
TRIGLYCERIDES: 64 mg/dL (ref 0.0–149.0)
VLDL: 12.8 mg/dL (ref 0.0–40.0)

## 2014-02-03 LAB — CBC WITH DIFFERENTIAL/PLATELET
BASOS PCT: 0.3 % (ref 0.0–3.0)
Basophils Absolute: 0 10*3/uL (ref 0.0–0.1)
EOS PCT: 1.6 % (ref 0.0–5.0)
Eosinophils Absolute: 0.1 10*3/uL (ref 0.0–0.7)
HEMATOCRIT: 48.9 % (ref 39.0–52.0)
HEMOGLOBIN: 16.7 g/dL (ref 13.0–17.0)
LYMPHS ABS: 1 10*3/uL (ref 0.7–4.0)
Lymphocytes Relative: 15.5 % (ref 12.0–46.0)
MCHC: 34.2 g/dL (ref 30.0–36.0)
MCV: 92.8 fl (ref 78.0–100.0)
MONO ABS: 0.6 10*3/uL (ref 0.1–1.0)
MONOS PCT: 8.3 % (ref 3.0–12.0)
Neutro Abs: 5 10*3/uL (ref 1.4–7.7)
Neutrophils Relative %: 74.3 % (ref 43.0–77.0)
Platelets: 214 10*3/uL (ref 150.0–400.0)
RBC: 5.27 Mil/uL (ref 4.22–5.81)
RDW: 12.8 % (ref 11.5–15.5)
WBC: 6.8 10*3/uL (ref 4.0–10.5)

## 2014-02-03 LAB — URINALYSIS, ROUTINE W REFLEX MICROSCOPIC
BILIRUBIN URINE: NEGATIVE
KETONES UR: NEGATIVE
Leukocytes, UA: NEGATIVE
NITRITE: NEGATIVE
PH: 7 (ref 5.0–8.0)
SPECIFIC GRAVITY, URINE: 1.015 (ref 1.000–1.030)
TOTAL PROTEIN, URINE-UPE24: NEGATIVE
Urine Glucose: NEGATIVE
Urobilinogen, UA: 1 (ref 0.0–1.0)
WBC, UA: NONE SEEN (ref 0–?)

## 2014-02-03 LAB — TSH: TSH: 1.37 u[IU]/mL (ref 0.35–4.50)

## 2014-02-03 LAB — BASIC METABOLIC PANEL
BUN: 14 mg/dL (ref 6–23)
CALCIUM: 9.6 mg/dL (ref 8.4–10.5)
CO2: 32 mEq/L (ref 19–32)
CREATININE: 1 mg/dL (ref 0.4–1.5)
Chloride: 100 mEq/L (ref 96–112)
GFR: 81.88 mL/min (ref >60.00)
Glucose, Bld: 100 mg/dL — ABNORMAL HIGH (ref 70–99)
POTASSIUM: 4.6 meq/L (ref 3.5–5.1)
Sodium: 139 mEq/L (ref 135–145)

## 2014-02-03 LAB — PSA: PSA: 1.05 ng/mL (ref 0.10–4.00)

## 2014-02-03 MED ORDER — LOSARTAN POTASSIUM 50 MG PO TABS
50.0000 mg | ORAL_TABLET | Freq: Every day | ORAL | Status: DC
Start: 2014-02-03 — End: 2014-02-09

## 2014-02-03 NOTE — Progress Notes (Signed)
Subjective:    Patient ID: Ronald Thompson, male    DOB: 1956/08/02, 58 y.o.   MRN: [id number]  HPI  Here for wellness and establish with new PCP;  Overall doing ok;  Pt denies CP, worsening SOB, DOE, wheezing, orthopnea, PND, worsening LE edema, palpitations, dizziness or syncope.  Pt denies neurological change such as new headache, facial or extremity weakness.  Pt denies polydipsia, polyuria, or low sugar symptoms. Pt states overall good compliance with treatment and medications, good tolerability, and has been trying to follow lower cholesterol diet.  Pt denies worsening depressive symptoms, suicidal ideation or panic. No fever, night sweats, wt loss, loss of appetite, or other constitutional symptoms.  Pt states good ability with ADL's, has low fall risk, home safety reviewed and adequate, no other significant changes in hearing or vision, and only occasionally active with exercise, plans to do more soon. Mentions decreased hearing for 1 wk, has hx of wax impactions Past Medical History  Diagnosis Date  . 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 Baker's cyst    Past Surgical History  Procedure Laterality Date  . Thoracotomy  1990    wedge biopsy of mediastinal mass    reports that he has never smoked. He has never used smokeless tobacco. He reports that he drinks alcohol. He reports that he does not use illicit drugs. family history includes Cancer in his maternal aunt and paternal grandfather; Dementia in his mother; Emphysema in his maternal aunt. Allergies  Allergen Reactions  . Penicillins Hives    Entire body.   No current facility-administered medications on file prior to visit.   Current Outpatient Prescriptions on File Prior to Visit  Medication Sig Dispense Refill  . simvastatin (ZOCOR) 20 MG tablet Take 1 tablet (20 mg total) by mouth at bedtime.  90 tablet  3   Review of Systems Constitutional:  Negative for increased diaphoresis, other activity, appetite or other siginficant weight change  HENT: Negative for worsening hearing loss, ear pain, facial swelling, mouth sores and neck stiffness.   Eyes: Negative for other worsening pain, redness or visual disturbance.  Respiratory: Negative for shortness of breath and wheezing.   Cardiovascular: Negative for chest pain and palpitations.  Gastrointestinal: Negative for diarrhea, blood in stool, abdominal distention or other pain Genitourinary: Negative for hematuria, flank pain or change in urine volume.  Musculoskeletal: Negative for myalgias or other joint complaints.  Skin: Negative for color change and wound.  Neurological: Negative for syncope and numbness. other than noted Hematological: Negative for adenopathy. or other swelling Psychiatric/Behavioral: Negative for hallucinations, self-injury, decreased concentration or other worsening agitation.      Objective:   Physical Exam BP 120/80  Pulse 77  Temp(Src) 97.8 F (36.6 C) (Oral)  Ht 5\' 10"  (1.778 m)  Wt 190 lb 8 oz (86.41 kg)  BMI 27.33 kg/m2  SpO2 93% VS noted,  Constitutional: Pt is oriented to person, place, and time. Appears well-developed and well-nourished.  Head: Normocephalic and atraumatic.  Right Ear: External ear normal.  Left Ear: External ear normal.  Nose: Nose normal.  Mouth/Throat: Oropharynx is clear and moist.  Eyes: Conjunctivae and EOM are normal. Pupils are equal, round, and reactive to light.  Neck: Normal range of motion. Neck supple. No JVD present. No tracheal deviation present.  Cardiovascular: Normal rate, regular rhythm, normal heart sounds and intact distal pulses.  with  gr 2/6 sys Murmur RUSB Pulmonary/Chest: Effort normal and breath sounds without rales or wheezing  Abdominal: Soft. Bowel sounds are normal. NT. No HSM  Musculoskeletal: Normal range of motion. Exhibits no edema.  Lymphadenopathy:  Has no cervical adenopathy.    Neurological: Pt is alert and oriented to person, place, and time. Pt has normal reflexes. No cranial nerve deficit. Motor grossly intact Skin: Skin is warm and dry. No rash noted.  Psychiatric:  Has normal mood and affect. Behavior is normal.     Assessment & Plan:

## 2014-02-03 NOTE — Patient Instructions (Signed)
Your ears were irrigated of wax today   Your EKG was OK today  Please continue all other medications as before, and refills have been done if requested.  Please have the pharmacy call with any other refills you may need.  Please continue your efforts at being more active, low cholesterol diet, and weight control.  Please call your cust service at [facility] to check on the copay for the shingle shot  You are otherwise up to date with prevention measures today.  Please keep your appointments with your specialists as you may have planned  You will be contacted regarding the referral for: echocardiogram  Please go to the LAB in the Basement (turn left off the elevator) for the tests to be done today  You will be contacted by phone if any changes need to be made immediately.  Otherwise, you will receive a letter about your results with an explanation, but please check with MyChart first.  Please remember to sign up for MyChart if you have not done so, as this will be important to you in the future with finding out test results, communicating by private email, and scheduling acute appointments online when needed.  Please return in 1 year for your yearly visit, or sooner if needed, with Lab testing done 3-5 days before

## 2014-02-03 NOTE — Progress Notes (Signed)
Pre visit review using our clinic review tool, if applicable. No additional management support is needed unless otherwise documented below in the visit note. 

## 2014-02-03 NOTE — Assessment & Plan Note (Signed)

## 2014-02-04 ENCOUNTER — Observation Stay (HOSPITAL_COMMUNITY)
Admission: EM | Admit: 2014-02-04 | Discharge: 2014-02-05 | Disposition: A | Discharge status: Home / Self Care | Payer: BC Managed Care – PPO | Attending: Internal Medicine | Admitting: Internal Medicine

## 2014-02-04 ENCOUNTER — Emergency Department (HOSPITAL_COMMUNITY): Payer: BC Managed Care – PPO

## 2014-02-04 ENCOUNTER — Encounter (HOSPITAL_COMMUNITY): Payer: Self-pay | Admitting: Emergency Medicine

## 2014-02-04 DIAGNOSIS — Z79899 Other long term (current) drug therapy: Secondary | ICD-10-CM | POA: Insufficient documentation

## 2014-02-04 DIAGNOSIS — I319 Disease of pericardium, unspecified: Secondary | ICD-10-CM | POA: Insufficient documentation

## 2014-02-04 DIAGNOSIS — R110 Nausea: Secondary | ICD-10-CM | POA: Insufficient documentation

## 2014-02-04 DIAGNOSIS — R109 Unspecified abdominal pain: Secondary | ICD-10-CM | POA: Diagnosis present

## 2014-02-04 DIAGNOSIS — Z880 Allergy status to penicillin: Secondary | ICD-10-CM | POA: Insufficient documentation

## 2014-02-04 DIAGNOSIS — R5383 Other fatigue: Secondary | ICD-10-CM

## 2014-02-04 DIAGNOSIS — R030 Elevated blood-pressure reading, without diagnosis of hypertension: Secondary | ICD-10-CM | POA: Diagnosis present

## 2014-02-04 DIAGNOSIS — Z8547 Personal history of malignant neoplasm of testis: Secondary | ICD-10-CM | POA: Insufficient documentation

## 2014-02-04 DIAGNOSIS — K8689 Other specified diseases of pancreas: Secondary | ICD-10-CM | POA: Diagnosis present

## 2014-02-04 DIAGNOSIS — R011 Cardiac murmur, unspecified: Secondary | ICD-10-CM | POA: Diagnosis present

## 2014-02-04 DIAGNOSIS — E785 Hyperlipidemia, unspecified: Secondary | ICD-10-CM | POA: Diagnosis present

## 2014-02-04 DIAGNOSIS — I350 Nonrheumatic aortic (valve) stenosis: Secondary | ICD-10-CM

## 2014-02-04 DIAGNOSIS — E168 Other specified disorders of pancreatic internal secretion: Principal | ICD-10-CM | POA: Insufficient documentation

## 2014-02-04 DIAGNOSIS — R5381 Other malaise: Secondary | ICD-10-CM | POA: Insufficient documentation

## 2014-02-04 DIAGNOSIS — R933 Abnormal findings on diagnostic imaging of other parts of digestive tract: Secondary | ICD-10-CM

## 2014-02-04 DIAGNOSIS — E860 Dehydration: Secondary | ICD-10-CM | POA: Diagnosis present

## 2014-02-04 DIAGNOSIS — M7120 Synovial cyst of popliteal space [Baker], unspecified knee: Secondary | ICD-10-CM | POA: Insufficient documentation

## 2014-02-04 LAB — CBC WITH DIFFERENTIAL/PLATELET
BASOS PCT: 0 % (ref 0–1)
Basophils Absolute: 0 10*3/uL (ref 0.0–0.1)
Eosinophils Absolute: 0.1 10*3/uL (ref 0.0–0.7)
Eosinophils Relative: 1 % (ref 0–5)
HCT: 45.4 % (ref 39.0–52.0)
Hemoglobin: 16.4 g/dL (ref 13.0–17.0)
Lymphocytes Relative: 13 % (ref 12–46)
Lymphs Abs: 0.9 10*3/uL (ref 0.7–4.0)
MCH: 32 pg (ref 26.0–34.0)
MCHC: 36.1 g/dL — ABNORMAL HIGH (ref 30.0–36.0)
MCV: 88.5 fL (ref 78.0–100.0)
Monocytes Absolute: 0.4 10*3/uL (ref 0.1–1.0)
Monocytes Relative: 6 % (ref 3–12)
NEUTROS ABS: 5.8 10*3/uL (ref 1.7–7.7)
NEUTROS PCT: 80 % — AB (ref 43–77)
Platelets: 185 10*3/uL (ref 150–400)
RBC: 5.13 MIL/uL (ref 4.22–5.81)
RDW: 11.9 % (ref 11.5–15.5)
WBC: 7.1 10*3/uL (ref 4.0–10.5)

## 2014-02-04 LAB — COMPREHENSIVE METABOLIC PANEL
ALBUMIN: 4.1 g/dL (ref 3.5–5.2)
ALK PHOS: 63 U/L (ref 39–117)
ALT: 28 U/L (ref 0–53)
ANION GAP: 11 (ref 5–15)
AST: 24 U/L (ref 0–37)
BUN: 20 mg/dL (ref 6–23)
CHLORIDE: 99 meq/L (ref 96–112)
CO2: 27 mEq/L (ref 19–32)
CREATININE: 0.99 mg/dL (ref 0.50–1.35)
Calcium: 10 mg/dL (ref 8.4–10.5)
GFR calc Af Amer: >90 mL/min (ref >90)
GFR calc non Af Amer: 87 mL/min — ABNORMAL LOW (ref >90)
Glucose, Bld: 123 mg/dL — ABNORMAL HIGH (ref 70–99)
POTASSIUM: 4.8 meq/L (ref 3.7–5.3)
Sodium: 137 mEq/L (ref 137–147)
Total Bilirubin: 0.9 mg/dL (ref 0.3–1.2)
Total Protein: 7.1 g/dL (ref 6.0–8.3)

## 2014-02-04 LAB — URINALYSIS, ROUTINE W REFLEX MICROSCOPIC
Bilirubin Urine: NEGATIVE
Glucose, UA: NEGATIVE mg/dL
Hgb urine dipstick: NEGATIVE
Ketones, ur: NEGATIVE mg/dL
Leukocytes, UA: NEGATIVE
NITRITE: NEGATIVE
Protein, ur: NEGATIVE mg/dL
UROBILINOGEN UA: 1 mg/dL (ref 0.0–1.0)
pH: 7 (ref 5.0–8.0)

## 2014-02-04 LAB — TROPONIN I

## 2014-02-04 LAB — LIPASE, BLOOD: LIPASE: 31 U/L (ref 11–59)

## 2014-02-04 LAB — MAGNESIUM: Magnesium: 1.7 mg/dL (ref 1.5–2.5)

## 2014-02-04 MED ORDER — ZOLPIDEM TARTRATE 5 MG PO TABS: 5.0000 mg | ORAL_TABLET | Freq: Every evening | ORAL | Status: DC | PRN

## 2014-02-04 MED ORDER — ONDANSETRON HCL 4 MG/2ML IJ SOLN
4.0000 mg | Freq: Once | INTRAMUSCULAR | Status: AC
  Administered 2014-02-04: 4 mg via INTRAVENOUS

## 2014-02-04 MED ORDER — SORBITOL 70 % SOLN: 30.0000 mL | Freq: Every day | Status: DC | PRN

## 2014-02-04 MED ORDER — SODIUM CHLORIDE 0.9 % IV BOLUS (SEPSIS)
1000.0000 mL | Freq: Once | INTRAVENOUS | Status: AC
  Administered 2014-02-04: 1000 mL via INTRAVENOUS

## 2014-02-04 MED ORDER — OXYCODONE HCL 5 MG PO TABS: 5.0000 mg | ORAL_TABLET | ORAL | Status: DC | PRN

## 2014-02-04 MED ORDER — GI COCKTAIL ~~LOC~~
30.0000 mL | Freq: Three times a day (TID) | ORAL | Status: DC | PRN
  Filled 2014-02-04: qty 30

## 2014-02-04 MED ORDER — ACETAMINOPHEN 325 MG PO TABS: 650.0000 mg | ORAL_TABLET | Freq: Four times a day (QID) | ORAL | Status: DC | PRN

## 2014-02-04 MED ORDER — POLYETHYLENE GLYCOL 3350 17 G PO PACK
17.0000 g | PACK | Freq: Every day | ORAL | Status: DC | PRN
  Administered 2014-02-05: 17 g via ORAL
  Filled 2014-02-04: qty 1

## 2014-02-04 MED ORDER — ONDANSETRON HCL 4 MG PO TABS: 4.0000 mg | ORAL_TABLET | Freq: Four times a day (QID) | ORAL | Status: DC | PRN

## 2014-02-04 MED ORDER — SIMVASTATIN 20 MG PO TABS
20.0000 mg | ORAL_TABLET | Freq: Every day | ORAL | Status: DC
  Administered 2014-02-04: 20 mg via ORAL
  Filled 2014-02-04 (×2): qty 1

## 2014-02-04 MED ORDER — SODIUM CHLORIDE 0.9 % IV SOLN
INTRAVENOUS | Status: DC
  Administered 2014-02-04: 900 mL via INTRAVENOUS
  Administered 2014-02-05 (×2): via INTRAVENOUS

## 2014-02-04 MED ORDER — IOHEXOL 350 MG/ML SOLN
125.0000 mL | Freq: Once | INTRAVENOUS | Status: AC | PRN
  Administered 2014-02-04: 125 mL via INTRAVENOUS

## 2014-02-04 MED ORDER — LOSARTAN POTASSIUM 50 MG PO TABS
50.0000 mg | ORAL_TABLET | Freq: Every day | ORAL | Status: DC
  Administered 2014-02-04 – 2014-02-05 (×2): 50 mg via ORAL
  Filled 2014-02-04 (×2): qty 1

## 2014-02-04 MED ORDER — ONDANSETRON HCL 4 MG/2ML IJ SOLN: 4.0000 mg | Freq: Four times a day (QID) | INTRAMUSCULAR | Status: DC | PRN

## 2014-02-04 MED ORDER — SODIUM CHLORIDE 0.9 % IV SOLN
INTRAVENOUS | Status: AC
  Administered 2014-02-04: 14:00:00 via INTRAVENOUS

## 2014-02-04 MED ORDER — ACETAMINOPHEN 650 MG RE SUPP: 650.0000 mg | Freq: Four times a day (QID) | RECTAL | Status: DC | PRN

## 2014-02-04 MED ORDER — MAGNESIUM CITRATE PO SOLN
1.0000 | Freq: Once | ORAL | Status: AC | PRN
Start: 2014-02-04 — End: 2014-02-04

## 2014-02-04 MED ORDER — ENOXAPARIN SODIUM 40 MG/0.4ML ~~LOC~~ SOLN
40.0000 mg | SUBCUTANEOUS | Status: DC
  Administered 2014-02-04: 40 mg via SUBCUTANEOUS
  Filled 2014-02-04 (×2): qty 0.4

## 2014-02-04 MED ORDER — MORPHINE SULFATE 4 MG/ML IJ SOLN
4.0000 mg | Freq: Once | INTRAMUSCULAR | Status: AC
  Administered 2014-02-04: 4 mg via INTRAVENOUS
  Filled 2014-02-04: qty 1

## 2014-02-04 MED ORDER — HYDROMORPHONE HCL PF 1 MG/ML IJ SOLN
1.0000 mg | INTRAMUSCULAR | Status: DC | PRN
  Administered 2014-02-04: 1 mg via INTRAVENOUS
  Filled 2014-02-04: qty 1

## 2014-02-04 MED ORDER — ONDANSETRON HCL 4 MG/2ML IJ SOLN
4.0000 mg | Freq: Once | INTRAMUSCULAR | Status: AC
  Administered 2014-02-04: 4 mg via INTRAVENOUS
  Filled 2014-02-04: qty 2

## 2014-02-04 MED ORDER — PANTOPRAZOLE SODIUM 40 MG PO TBEC
40.0000 mg | DELAYED_RELEASE_TABLET | Freq: Every day | ORAL | Status: DC
  Administered 2014-02-05: 40 mg via ORAL
  Filled 2014-02-04 (×2): qty 1

## 2014-02-04 MED ORDER — HYDROMORPHONE HCL PF 1 MG/ML IJ SOLN
1.0000 mg | INTRAMUSCULAR | Status: DC | PRN
  Administered 2014-02-04 – 2014-02-05 (×2): 1 mg via INTRAVENOUS
  Filled 2014-02-04 (×2): qty 1

## 2014-02-04 MED ORDER — ONDANSETRON HCL 4 MG/2ML IJ SOLN
4.0000 mg | Freq: Three times a day (TID) | INTRAMUSCULAR | Status: DC | PRN
Start: 2014-02-04 — End: 2014-02-04
  Filled 2014-02-04: qty 2

## 2014-02-04 NOTE — Progress Notes (Signed)
Patient arrived to rm 1324 at 1416 via stretcher, from the ED. Wife at bedside. Alert and orientedx4. Placed comfortably in bed. Will continue to monitor the patient.- Sandie Ano RN

## 2014-02-04 NOTE — Assessment & Plan Note (Signed)
Improved s/p irrigation bilat 

## 2014-02-04 NOTE — Assessment & Plan Note (Signed)
For f/u echo

## 2014-02-04 NOTE — H&P (Signed)
[facility] History and Physical  Ronald Thompson ZYS:[id number] DOB: 06/08/1956 DOA: 02/04/2014  Referring physician: Dr Wyvonnia Dusky PCP: Cathlean Cower, MD   Chief Complaint: abd pain  HPI: Ronald Thompson is a 58 y.o. male  With history of cardiac murmur likely secondary to mild to moderate aortic stenosis per 2-D echo of 07/09/2010, hypertension, hyperlipidemia, history of testicular seminoma diagnosed in 1999 at [facility] per patient tumor wrapped around his heart and lungs results in the pericarditis, history of lumbar spine surgery in June of 2014 secondary to a ruptured disc who presents to the ED with sudden onset of abdominal pain started on the left side and radiating across his entire abdomen and also in the epigastric region radiating to his back. Patient describes the pain as a throbbing, burning, cramping pain which is constant and intermittent in its. He. Patient also with pain he states radiating to his chest with occasional feelings of indigestion. Patient endorses some nausea, generalized fatigue over several days. Patient denies any vomiting, no shortness of breath, no fever, no chills, no dysuria, no diarrhea, no constipation, no melena, no hematemesis, no hematochezia. Patient was seen in the emergency room comprehensive metabolic profile which was done was unremarkable. Lipase level was 31. Cardiac enzymes were negative x2. CBC was unremarkable. Acute abdominal series was negative. CT angiogram of the chest abdomen and pelvis was negative for dissection or aneurysm. Showed moderate elevation of left hemidiaphragm a fairly extensive scar type changes involving the left lung with no acute pulmonary findings. 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 2.6 cm pancreatic head lesion with borderline enlarged peripancreatic and celiac axis lymph nodes were noted with no ductal obstruction. We were called to admit the patient for  further evaluation and management.   Review of Systems:  As patient otherwise negative. Constitutional:  No weight loss, night sweats, Fevers, chills, fatigue.  HEENT:  No headaches, Difficulty swallowing,Tooth/dental problems,Sore throat,  No sneezing, itching, ear ache, nasal congestion, post nasal drip,  Cardio-vascular:  No chest pain, Orthopnea, PND, swelling in lower extremities, anasarca, dizziness, palpitations  GI:  No heartburn, indigestion, abdominal pain, nausea, vomiting, diarrhea, change in bowel habits, loss of appetite  Resp:  No shortness of breath with exertion or at rest. No excess mucus, no productive cough, No non-productive cough, No coughing up of blood.No change in color of mucus.No wheezing.No chest wall deformity  Skin:  no rash or lesions.  GU:  no dysuria, change in color of urine, no urgency or frequency. No flank pain.  Musculoskeletal:  No joint pain or swelling. No decreased range of motion. No back pain.  Psych:  No change in mood or affect. No depression or anxiety. No memory loss.   Past Medical History  Diagnosis Date  . 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 Baker's cyst    Past Surgical History  Procedure Laterality Date  . Thoracotomy  1990    wedge biopsy of mediastinal mass   Social History:  reports that he has never smoked. He has never used smokeless tobacco. He reports that he drinks alcohol. He reports that he does not use illicit drugs.  Allergies  Allergen Reactions  . Penicillins Hives    Entire body.    Family History  Problem Relation Age of Onset  . Dementia Mother   . Cancer Maternal Aunt  colon  . Emphysema Maternal Aunt   . Cancer Paternal Grandfather     esophagus     Prior to Admission medications   Medication Sig Start Date End Date Taking? Authorizing Provider  calcium carbonate (TUMS - DOSED IN MG ELEMENTAL CALCIUM) 500 MG  chewable tablet Chew 3 tablets by mouth as needed for indigestion or heartburn.   Yes Historical Provider, MD  Cyanocobalamin (VITAMIN B 12 PO) Take 1 tablet by mouth daily.   Yes Historical Provider, MD  ibuprofen (ADVIL,MOTRIN) 200 MG tablet Take 600 mg by mouth as needed.   Yes Historical Provider, MD  losartan (COZAAR) 50 MG tablet Take 1 tablet (50 mg total) by mouth daily. 02/03/14  Yes [name], MD  OVER THE COUNTER MEDICATION Take 1 tablet by mouth daily.   Yes Historical Provider, MD  simvastatin (ZOCOR) 20 MG tablet Take 1 tablet (20 mg total) by mouth at bedtime. 02/01/14  Yes Hendricks Limes, MD   Physical Exam: Filed Vitals:   02/04/14 1436  BP: 129/72  Pulse: 64  Temp: 98.3 F (36.8 C)  Resp: 17    BP 129/72  Pulse 64  Temp(Src) 98.3 F (36.8 C) (Oral)  Resp 17  Ht 5\' 10"  (1.778 m)  Wt 86.1 kg (189 lb 13.1 oz)  BMI 27.24 kg/m2  SpO2 99%  General:  Well-developed well-nourished in no acute cardiopulmonary distress speaking in full sentences.  Eyes: PERRLA, EOMI, normal lids, irises & conjunctiva ENT: grossly normal hearing, lips & tongue Neck: no LAD, masses or thyromegaly Cardiovascular: RRR with a 3/6 systolic ejection murmur. No LE edema. Respiratory: CTA bilaterally, no w/r/r. Normal respiratory effort. Abdomen: soft, nondistended, tender to palpation in the epigastrium and upper abdomen, no rebound, no guarding, positive bowel sounds. Skin: no rash or induration seen on limited exam Musculoskeletal: grossly normal tone BUE/BLE Psychiatric: grossly normal mood and affect, speech fluent and appropriate Neurologic: Alert and oriented x3. Cranial nerves II through XII are grossly intact. No focal deficits.           Labs on Admission:  Basic Metabolic Panel:  Recent Labs Lab 02/03/14 0951 02/04/14 0931  NA 139 137  K 4.6 4.8  CL 100 99  CO2 32 27  GLUCOSE 100* 123*  BUN 14 20  CREATININE 1.0 0.99  CALCIUM 9.6 10.0   Liver Function  Tests:  Recent Labs Lab 02/03/14 0951 02/04/14 0931  AST 30 24  ALT 35 28  ALKPHOS 59 63  BILITOT 1.4* 0.9  PROT 7.2 7.1  ALBUMIN 4.5 4.1    Recent Labs Lab 02/04/14 0935  LIPASE 31   No results found for this basename: AMMONIA,  in the last 168 hours CBC:  Recent Labs Lab 02/03/14 0951 02/04/14 0931  WBC 6.8 7.1  NEUTROABS 5.0 5.8  HGB 16.7 16.4  HCT 48.9 45.4  MCV 92.8 88.5  PLT 214.0 185   Cardiac Enzymes:  Recent Labs Lab 02/04/14 0931 02/04/14 1323  TROPONINI <0.30 <0.30    BNP (last 3 results) No results found for this basename: PROBNP,  in the last 8760 hours CBG: No results found for this basename: GLUCAP,  in the last 168 hours  Radiological Exams on Admission: Dg Abd Acute W/chest  02/04/2014   CLINICAL DATA:  Abdominal pain and bloating  EXAM: ACUTE ABDOMEN SERIES (ABDOMEN 2 VIEW & CHEST 1 VIEW)  COMPARISON:  None.  FINDINGS: Elevation of left hemidiaphragm is noted. The cardiac shadow is at the upper  limits of normal in size. The lungs show some mild interstitial changes although no focal confluent infiltrate is seen.  Scattered large and small bowel gas is noted. Fecal material is noted within the colon. No true obstructive changes are seen. No free air is noted. The bony structures are within normal limits.  IMPRESSION: Nonspecific chest and abdomen. No definitive acute abnormality is seen.   Electronically Signed   By: Inez Catalina M.D.   On: 02/04/2014 10:14   Ct Angio Chest Aortic Dissect W &/or W/o  02/04/2014   CLINICAL DATA:  Chest and abdominal pain.  EXAM: CT ANGIOGRAPHY CHEST, ABDOMEN AND PELVIS  TECHNIQUE: Multidetector CT imaging through the chest, abdomen and pelvis was performed using the standard protocol during bolus administration of intravenous contrast. Multiplanar reconstructed images and MIPs were obtained and reviewed to evaluate the vascular anatomy.  CONTRAST:  1102mL OMNIPAQUE IOHEXOL 350 MG/ML SOLN  COMPARISON:  None.  FINDINGS:  CTA CHEST FINDINGS  Extensive chest wall collaterals are noted. 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 This could be related to prior Port-A-Cath or central line placement. No supraclavicular or axillary mass or adenopathy.  The bony thorax is intact. No destructive bone lesions or spinal canal compromise.  The heart is normal in size. No pericardial effusion. No mediastinal or hilar mass or adenopathy. Scattered lymph nodes are noted. The aorta demonstrates mild tortuosity but no dissection or focal aneurysm. The branch vessels are patent. The pulmonary arteries appear normal. No findings for pulmonary embolism. The esophagus is grossly normal.  Examination of the lung parenchyma demonstrates fairly extensive scarring changes in the left lung along with eventration of the left hemidiaphragm. This could be due to radiation or prior infection. No worrisome pulmonary nodules. No pulmonary edema or pleural effusion.  Review of the MIP images confirms the above findings.  CTA ABDOMEN AND PELVIS FINDINGS  The abdominal aorta is normal in caliber. Minimal scattered atherosclerotic calcifications. No dissection. The branch vessels are patent. There are 2 renal arteries bilaterally. The iliac arteries are normal except for scattered atherosclerotic calcifications. The common femoral arteries are patent.  The liver is unremarkable. There is mild diffuse fatty infiltration but no focal hepatic lesions or intrahepatic biliary dilatation. The gallbladder is unremarkable. No common bile duct dilatation. There is a slightly irregular 2.6 cm low-attenuation lesion in the pancreatic head. It measures between 26 and 29 Hounsfield units and could be a complex cyst but a neoplasm needs to be excluded. There is an adjacent borderline enlarged peripancreatic lymph node on image number 129 which measures 14 x 7 mm. The pancreatic duct is normal in caliber and has a normal course. The  pancreatic body and tail are unremarkable. The spleen is normal in size. No focal lesions. The adrenal glands and kidneys are unremarkable. There is a simple appearing cyst associated with the right kidney.  The stomach, duodenum, small bowel and colon are grossly normal without oral contrast. No inflammatory changes, mass lesions or obstructive findings.  The bladder, prostate gland and seminal vesicles are unremarkable. No pelvic mass, adenopathy or free pelvic fluid collections. No inguinal mass or adenopathy.  The bony structures are unremarkable.  Review of the MIP images confirms the above findings.  IMPRESSION: 1. No CT findings for thoracic or abdominal aortic dissection or aneurysm. 2. Marked elevation of the left hemidiaphragm and fairly extensive scarring type changes involving the left lung. No definite acute pulmonary findings. 3. 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 4. 2.6 cm pancreatic head lesion with borderline enlarged peripancreatic and celiac axis lymph nodes. No ductal obstruction. Recommend MRI abdomen without and with contrast for further evaluation. Recommend GI consultation. Patient may require endoscopic biopsy or PET-CT.   Electronically Signed   By: Kalman Jewels M.D.   On: 02/04/2014 12:19   Ct Angio Abd/pel W/ And/or W/o  02/04/2014   CLINICAL DATA:  Chest and abdominal pain.  EXAM: CT ANGIOGRAPHY CHEST, ABDOMEN AND PELVIS  TECHNIQUE: Multidetector CT imaging through the chest, abdomen and pelvis was performed using the standard protocol during bolus administration of intravenous contrast. Multiplanar reconstructed images and MIPs were obtained and reviewed to evaluate the vascular anatomy.  CONTRAST:  160mL OMNIPAQUE IOHEXOL 350 MG/ML SOLN  COMPARISON:  None.  FINDINGS: CTA CHEST FINDINGS  Extensive chest wall collaterals are noted. There appears to be a chronically occluded left brachiocephalic vein and  subclavian vein. This could be related to prior Port-A-Cath or central line placement. No supraclavicular or axillary mass or adenopathy.  The bony thorax is intact. No destructive bone lesions or spinal canal compromise.  The heart is normal in size. No pericardial effusion. No mediastinal or hilar mass or adenopathy. Scattered lymph nodes are noted. The aorta demonstrates mild tortuosity but no dissection or focal aneurysm. The branch vessels are patent. The pulmonary arteries appear normal. No findings for pulmonary embolism. The esophagus is grossly normal.  Examination of the lung parenchyma demonstrates fairly extensive scarring changes in the left lung along with eventration of the left hemidiaphragm. This could be due to radiation or prior infection. No worrisome pulmonary nodules. No pulmonary edema or pleural effusion.  Review of the MIP images confirms the above findings.  CTA ABDOMEN AND PELVIS FINDINGS  The abdominal aorta is normal in caliber. Minimal scattered atherosclerotic calcifications. No dissection. The branch vessels are patent. There are 2 renal arteries bilaterally. The iliac arteries are normal except for scattered atherosclerotic calcifications. The common femoral arteries are patent.  The liver is unremarkable. There is mild diffuse fatty infiltration but no focal hepatic lesions or intrahepatic biliary dilatation. The gallbladder is unremarkable. No common bile duct dilatation. There is a slightly irregular 2.6 cm low-attenuation lesion in the pancreatic head. It measures between 26 and 29 Hounsfield units and could be a complex cyst but a neoplasm needs to be excluded. There is an adjacent borderline enlarged peripancreatic lymph node on image number 129 which measures 14 x 7 mm. The pancreatic duct is normal in caliber and has a normal course. The pancreatic body and tail are unremarkable. The spleen is normal in size. No focal lesions. The adrenal glands and kidneys are unremarkable.  There is a simple appearing cyst associated with the right kidney.  The stomach, duodenum, small bowel and colon are grossly normal without oral contrast. No inflammatory changes, mass lesions or obstructive findings.  The bladder, prostate gland and seminal vesicles are unremarkable. No pelvic mass, adenopathy or free pelvic fluid collections. No inguinal mass or adenopathy.  The bony structures are unremarkable.  Review of the MIP images confirms the above findings.  IMPRESSION: 1. No CT findings for thoracic or abdominal aortic dissection or aneurysm. 2. Marked elevation of the left hemidiaphragm and fairly extensive scarring type changes involving the left lung. No definite acute pulmonary findings. 3. 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 4. 2.6 cm pancreatic head  lesion with borderline enlarged peripancreatic and celiac axis lymph nodes. No ductal obstruction. Recommend MRI abdomen without and with contrast for further evaluation. Recommend GI consultation. Patient may require endoscopic biopsy or PET-CT.   Electronically Signed   By: Kalman Jewels M.D.   On: 02/04/2014 12:19    EKG: Independently reviewed. Normal sinus rhythm with nonspecific ST-T wave abnormalities.  Assessment/Plan Principal Problem:   Pancreatic mass Active Problems:   DYSLIPIDEMIA   HEART MURMUR, SYSTOLIC   PREHYPERTENSION   Abdominal pain, unspecified site   Dehydration  #1 pancreatic mass Noted per CT of the abdomen and pelvis. Patient present with abdominal pain with tenderness to palpation in the epigastrium. Patient states that epigastric pain also radiated to his back. Lipase levels are within normal limits. Will place patient on a regular diet. IV fluids. Pain management. Anti-emetics. Supportive care. Spoke with Dr. Henrene Pastor of gastroenterology who will assess the patient. Patient will likely need an endoscopic ultrasound for further  evaluation and management. Follow.  #2 abdominal pain Likely secondary to problem #1. Lipase levels within normal limits. Will place on a PPI. GI cocktail as needed. IV fluids. Pain management. Supportive care.  #3 elevated blood pressure/Prehypertension Continue home regimen of Cozaar.  #4 hyperlipidemia Continue home statin.  #5 dehydration IV fluids.  #6 prophylaxis PPI for GI prophylaxis. Lovenox for DVT prophylaxis.  Code Status: Full Family Communication: Updated patient and wife at bedside. Disposition Plan: Admit under observation to a MedSurg bed  Time spent: [employer] MD [facility] Pager [phone]  **Disclaimer: This note may have been dictated with voice recognition software. Similar sounding words can inadvertently be transcribed and this note may contain transcription errors which may not have been corrected upon publication of note.**

## 2014-02-04 NOTE — ED Notes (Signed)
Patient is aware that we need a urine specimen.  

## 2014-02-04 NOTE — ED Provider Notes (Signed)
CSN: [id number]     Arrival date & time 02/04/14  0910 History   First MD Initiated Contact with Patient 02/04/14 [phone]     Chief Complaint  Patient presents with  . Abdominal Pain     (Consider location/radiation/quality/duration/timing/severity/associated sxs/prior Treatment) HPI Comments: Patient presents with cramping to his abdomen that is constant since 1 AM but waxing and waning in severity. The pain is in his left side it radiates across his entire abdomen. He says it feels like gas. He has not had similar pain before. He feels nauseated but not vomited. Last bowel movement was yesterday. Denies any cough or fever. Denies any shortness of breath. Physical yesterday which was normal. Denies any history of heart problems. He has a history of seminoma status post resection with history of pericarditis and mediastinal mass. This was 25 years ago. He states he is cancer free at this time. He reports pain left-sided on his left abdomen with palpation that spreads across his abdomen.  He denies any pain in his chest but does have pain in his mid back. Denies any fever, cough or congestion. Denies any urinary symptoms or testicular pain.  The history is provided by the patient and a relative.    Past Medical History  Diagnosis Date  . 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 Baker's cyst    Past Surgical History  Procedure Laterality Date  . Thoracotomy  1990    wedge biopsy of mediastinal mass   Family History  Problem Relation Age of Onset  . Dementia Mother   . Cancer Maternal Aunt     colon  . Emphysema Maternal Aunt   . Cancer Paternal Grandfather     esophagus   History  Substance Use Topics  . Smoking status: Never Smoker   . Smokeless tobacco: Never Used  . Alcohol Use: Yes    Review of Systems  Constitutional: Positive for fatigue. Negative for fever, activity change and appetite change.   HENT: Negative for congestion and rhinorrhea.   Respiratory: Negative for cough, chest tightness and shortness of breath.   Cardiovascular: Negative for chest pain.  Gastrointestinal: Positive for nausea and abdominal pain. Negative for diarrhea.  Genitourinary: Negative for dysuria and hematuria.  Musculoskeletal: Negative for arthralgias and myalgias.  Skin: Negative for rash.  Neurological: Negative for dizziness, weakness and headaches.  A complete 10 system review of systems was obtained and all systems are negative except as noted in the HPI and PMH.      Allergies  Penicillins  Home Medications   Prior to Admission medications   Medication Sig Start Date End Date Taking? Authorizing Provider  calcium carbonate (TUMS - DOSED IN MG ELEMENTAL CALCIUM) 500 MG chewable tablet Chew 3 tablets by mouth as needed for indigestion or heartburn.   Yes Historical Provider, MD  Cyanocobalamin (VITAMIN B 12 PO) Take 1 tablet by mouth daily.   Yes Historical Provider, MD  ibuprofen (ADVIL,MOTRIN) 200 MG tablet Take 600 mg by mouth as needed.   Yes Historical Provider, MD  losartan (COZAAR) 50 MG tablet Take 1 tablet (50 mg total) by mouth daily. 02/03/14  Yes [name], MD  OVER THE COUNTER MEDICATION Take 1 tablet by mouth daily.   Yes Historical Provider, MD  simvastatin (ZOCOR) 20 MG tablet Take 1 tablet (20 mg total) by mouth at bedtime. 02/01/14  Yes Gwyndolyn Saxon  Chanda Busing, MD   BP 129/72  Pulse 64  Temp(Src) 98.3 F (36.8 C) (Oral)  Resp 17  Ht 5\' 10"  (1.778 m)  Wt 189 lb 13.1 oz (86.1 kg)  BMI 27.24 kg/m2  SpO2 99% Physical Exam  Nursing note and vitals reviewed. Constitutional: He is oriented to person, place, and time. He appears well-developed and well-nourished. No distress.  uncomfortable  HENT:  Head: Normocephalic and atraumatic.  Mouth/Throat: Oropharynx is clear and moist. No oropharyngeal exudate.  Eyes: Conjunctivae and EOM are normal. Pupils are equal, round, and  reactive to light.  Neck: Normal range of motion. Neck supple.  No meningismus.  Cardiovascular: Normal rate, regular rhythm, normal heart sounds and intact distal pulses.   No murmur heard. Pulmonary/Chest: Effort normal and breath sounds normal. No respiratory distress.  Abdominal: Soft. There is tenderness. There is no rebound and no guarding.  Diffuse tenderness, worse in the LUQ, diffuse across abdomen with voluntary guarding.   Musculoskeletal: Normal range of motion. He exhibits no edema and no tenderness.  Neurological: He is alert and oriented to person, place, and time. No cranial nerve deficit. He exhibits normal muscle tone. Coordination normal.  No ataxia on finger to nose bilaterally. No pronator drift. 5/5 strength throughout. CN 2-12 intact. Negative Romberg. Equal grip strength. Sensation intact. Gait is normal.   Skin: Skin is warm.  Psychiatric: He has a normal mood and affect. His behavior is normal.    ED Course  Procedures (including critical care time) Labs Review Labs Reviewed  CBC WITH DIFFERENTIAL - Abnormal; Notable for the following:    MCHC 36.1 (*)    Neutrophils Relative % 80 (*)    All other components within normal limits  COMPREHENSIVE METABOLIC PANEL - Abnormal; Notable for the following:    Glucose, Bld 123 (*)    GFR calc non Af Amer 87 (*)    All other components within normal limits  URINALYSIS, ROUTINE W REFLEX MICROSCOPIC - Abnormal; Notable for the following:    Specific Gravity, Urine >1.046 (*)    All other components within normal limits  TROPONIN I  LIPASE, BLOOD  TROPONIN I  CBC  CREATININE, SERUM  MAGNESIUM    Imaging Review Dg Abd Acute W/chest  02/04/2014   CLINICAL DATA:  Abdominal pain and bloating  EXAM: ACUTE ABDOMEN SERIES (ABDOMEN 2 VIEW & CHEST 1 VIEW)  COMPARISON:  None.  FINDINGS: Elevation of left hemidiaphragm is noted. The cardiac shadow is at the upper limits of normal in size. The lungs show some mild interstitial  changes although no focal confluent infiltrate is seen.  Scattered large and small bowel gas is noted. Fecal material is noted within the colon. No true obstructive changes are seen. No free air is noted. The bony structures are within normal limits.  IMPRESSION: Nonspecific chest and abdomen. No definitive acute abnormality is seen.   Electronically Signed   By: Inez Catalina M.D.   On: 02/04/2014 10:14   Ct Angio Chest Aortic Dissect W &/or W/o  02/04/2014   CLINICAL DATA:  Chest and abdominal pain.  EXAM: CT ANGIOGRAPHY CHEST, ABDOMEN AND PELVIS  TECHNIQUE: Multidetector CT imaging through the chest, abdomen and pelvis was performed using the standard protocol during bolus administration of intravenous contrast. Multiplanar reconstructed images and MIPs were obtained and reviewed to evaluate the vascular anatomy.  CONTRAST:  146mL OMNIPAQUE IOHEXOL 350 MG/ML SOLN  COMPARISON:  None.  FINDINGS: CTA CHEST FINDINGS  Extensive chest wall collaterals are  noted. 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 This could be related to prior Port-A-Cath or central line placement. No supraclavicular or axillary mass or adenopathy.  The bony thorax is intact. No destructive bone lesions or spinal canal compromise.  The heart is normal in size. No pericardial effusion. No mediastinal or hilar mass or adenopathy. Scattered lymph nodes are noted. The aorta demonstrates mild tortuosity but no dissection or focal aneurysm. The branch vessels are patent. The pulmonary arteries appear normal. No findings for pulmonary embolism. The esophagus is grossly normal.  Examination of the lung parenchyma demonstrates fairly extensive scarring changes in the left lung along with eventration of the left hemidiaphragm. This could be due to radiation or prior infection. No worrisome pulmonary nodules. No pulmonary edema or pleural effusion.  Review of the MIP images confirms the above findings.  CTA  ABDOMEN AND PELVIS FINDINGS  The abdominal aorta is normal in caliber. Minimal scattered atherosclerotic calcifications. No dissection. The branch vessels are patent. There are 2 renal arteries bilaterally. The iliac arteries are normal except for scattered atherosclerotic calcifications. The common femoral arteries are patent.  The liver is unremarkable. There is mild diffuse fatty infiltration but no focal hepatic lesions or intrahepatic biliary dilatation. The gallbladder is unremarkable. No common bile duct dilatation. There is a slightly irregular 2.6 cm low-attenuation lesion in the pancreatic head. It measures between 26 and 29 Hounsfield units and could be a complex cyst but a neoplasm needs to be excluded. There is an adjacent borderline enlarged peripancreatic lymph node on image number 129 which measures 14 x 7 mm. The pancreatic duct is normal in caliber and has a normal course. The pancreatic body and tail are unremarkable. The spleen is normal in size. No focal lesions. The adrenal glands and kidneys are unremarkable. There is a simple appearing cyst associated with the right kidney.  The stomach, duodenum, small bowel and colon are grossly normal without oral contrast. No inflammatory changes, mass lesions or obstructive findings.  The bladder, prostate gland and seminal vesicles are unremarkable. No pelvic mass, adenopathy or free pelvic fluid collections. No inguinal mass or adenopathy.  The bony structures are unremarkable.  Review of the MIP images confirms the above findings.  IMPRESSION: 1. No CT findings for thoracic or abdominal aortic dissection or aneurysm. 2. Marked elevation of the left hemidiaphragm and fairly extensive scarring type changes involving the left lung. No definite acute pulmonary findings. 3. 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 4. 2.6 cm pancreatic head lesion with borderline enlarged  peripancreatic and celiac axis lymph nodes. No ductal obstruction. Recommend MRI abdomen without and with contrast for further evaluation. Recommend GI consultation. Patient may require endoscopic biopsy or PET-CT.   Electronically Signed   By: Kalman Jewels M.D.   On: 02/04/2014 12:19   Ct Angio Abd/pel W/ And/or W/o  02/04/2014   CLINICAL DATA:  Chest and abdominal pain.  EXAM: CT ANGIOGRAPHY CHEST, ABDOMEN AND PELVIS  TECHNIQUE: Multidetector CT imaging through the chest, abdomen and pelvis was performed using the standard protocol during bolus administration of intravenous contrast. Multiplanar reconstructed images and MIPs were obtained and reviewed to evaluate the vascular anatomy.  CONTRAST:  167mL OMNIPAQUE IOHEXOL 350 MG/ML SOLN  COMPARISON:  None.  FINDINGS: CTA CHEST FINDINGS  Extensive chest wall collaterals are noted. 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 This could be  related to prior Port-A-Cath or central line placement. No supraclavicular or axillary mass or adenopathy.  The bony thorax is intact. No destructive bone lesions or spinal canal compromise.  The heart is normal in size. No pericardial effusion. No mediastinal or hilar mass or adenopathy. Scattered lymph nodes are noted. The aorta demonstrates mild tortuosity but no dissection or focal aneurysm. The branch vessels are patent. The pulmonary arteries appear normal. No findings for pulmonary embolism. The esophagus is grossly normal.  Examination of the lung parenchyma demonstrates fairly extensive scarring changes in the left lung along with eventration of the left hemidiaphragm. This could be due to radiation or prior infection. No worrisome pulmonary nodules. No pulmonary edema or pleural effusion.  Review of the MIP images confirms the above findings.  CTA ABDOMEN AND PELVIS FINDINGS  The abdominal aorta is normal in caliber. Minimal scattered atherosclerotic calcifications. No  dissection. The branch vessels are patent. There are 2 renal arteries bilaterally. The iliac arteries are normal except for scattered atherosclerotic calcifications. The common femoral arteries are patent.  The liver is unremarkable. There is mild diffuse fatty infiltration but no focal hepatic lesions or intrahepatic biliary dilatation. The gallbladder is unremarkable. No common bile duct dilatation. There is a slightly irregular 2.6 cm low-attenuation lesion in the pancreatic head. It measures between 26 and 29 Hounsfield units and could be a complex cyst but a neoplasm needs to be excluded. There is an adjacent borderline enlarged peripancreatic lymph node on image number 129 which measures 14 x 7 mm. The pancreatic duct is normal in caliber and has a normal course. The pancreatic body and tail are unremarkable. The spleen is normal in size. No focal lesions. The adrenal glands and kidneys are unremarkable. There is a simple appearing cyst associated with the right kidney.  The stomach, duodenum, small bowel and colon are grossly normal without oral contrast. No inflammatory changes, mass lesions or obstructive findings.  The bladder, prostate gland and seminal vesicles are unremarkable. No pelvic mass, adenopathy or free pelvic fluid collections. No inguinal mass or adenopathy.  The bony structures are unremarkable.  Review of the MIP images confirms the above findings.  IMPRESSION: 1. No CT findings for thoracic or abdominal aortic dissection or aneurysm. 2. Marked elevation of the left hemidiaphragm and fairly extensive scarring type changes involving the left lung. No definite acute pulmonary findings. 3. 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 4. 2.6 cm pancreatic head lesion with borderline enlarged peripancreatic and celiac axis lymph nodes. No ductal obstruction. Recommend MRI abdomen without and with contrast for further  evaluation. Recommend GI consultation. Patient may require endoscopic biopsy or PET-CT.   Electronically Signed   By: Kalman Jewels M.D.   On: 02/04/2014 12:19     EKG Interpretation   Date/Time:  Friday February 04 2014 09:25:27 EDT Ventricular Rate:  69 PR Interval:  145 QRS Duration: 94 QT Interval:  410 QTC Calculation: 439 R Axis:   71 Text Interpretation:  Sinus rhythm Right atrial enlargement Minimal ST  depression, inferior leads Minimal ST elevation, anterior leads No  significant change was found Confirmed by Wyvonnia Dusky  MD, Nivek Powley ([id number]) on  02/04/2014 10:00:56 AM      MDM   Final diagnoses:  Pancreatic mass   left-sided abdominal pain constant since yesterday. Associated with nausea. EKG with minimal ST elevation in V1 and V2 which was discussed with Dr. Terrence Dupont. Patient denies any chest pain. Dr. Terrence Dupont  feels this is stable to previous EKG and is not represent STEMI.  Bedside ultrasound does not show a pericardial effusion.  Troponin negative. LFTs and lipase normal.   Given patient's abdominal and back pain, CT angiogram of chest abdomen and pelvis was obtained to evaluate for dissection.   There is no evidence of a aortic dissection. There is however a 2.6 cm pancreatic head lesion with enlarged lymph nodes. This is worrisome for carcinoma. This could be a recurrence of patient's seminoma.   He'll be admitted for further evaluation. Second troponin is negative. Pain is controlled.  D/w Dr. Grandville Silos.  Ezequiel Essex, MD 02/04/14 [phone]

## 2014-02-04 NOTE — Progress Notes (Signed)
Patient tolerated his food - regular diet this afternoon. Denies pain, no c/o nausea nor vomiting. Resting in bed.- Sandie Ano RN

## 2014-02-04 NOTE — ED Notes (Addendum)
Per pt, states he started having abdominal cramps on the left side that started at 1 in the morning-No V/D-some nausea-states normal physical with PCP yesterday-normal EKG

## 2014-02-04 NOTE — ED Notes (Signed)
Patient transported to CT 

## 2014-02-05 DIAGNOSIS — K869 Disease of pancreas, unspecified: Secondary | ICD-10-CM

## 2014-02-05 DIAGNOSIS — R933 Abnormal findings on diagnostic imaging of other parts of digestive tract: Secondary | ICD-10-CM

## 2014-02-05 DIAGNOSIS — R109 Unspecified abdominal pain: Secondary | ICD-10-CM

## 2014-02-05 LAB — CBC
HCT: 41 % (ref 39.0–52.0)
HEMOGLOBIN: 14.3 g/dL (ref 13.0–17.0)
MCH: 31.8 pg (ref 26.0–34.0)
MCHC: 34.9 g/dL (ref 30.0–36.0)
MCV: 91.3 fL (ref 78.0–100.0)
Platelets: 151 10*3/uL (ref 150–400)
RBC: 4.49 MIL/uL (ref 4.22–5.81)
RDW: 12.2 % (ref 11.5–15.5)
WBC: 9.3 10*3/uL (ref 4.0–10.5)

## 2014-02-05 LAB — COMPREHENSIVE METABOLIC PANEL
ALK PHOS: 54 U/L (ref 39–117)
ALT: 21 U/L (ref 0–53)
AST: 19 U/L (ref 0–37)
Albumin: 3.4 g/dL — ABNORMAL LOW (ref 3.5–5.2)
Anion gap: 8 (ref 5–15)
BUN: 15 mg/dL (ref 6–23)
CO2: 28 mEq/L (ref 19–32)
Calcium: 8.8 mg/dL (ref 8.4–10.5)
Chloride: 100 mEq/L (ref 96–112)
Creatinine, Ser: 1.05 mg/dL (ref 0.50–1.35)
GFR calc Af Amer: 87 mL/min — ABNORMAL LOW (ref >90)
GFR calc non Af Amer: 75 mL/min — ABNORMAL LOW (ref >90)
GLUCOSE: 98 mg/dL (ref 70–99)
POTASSIUM: 4.5 meq/L (ref 3.7–5.3)
SODIUM: 136 meq/L — AB (ref 137–147)
TOTAL PROTEIN: 6.2 g/dL (ref 6.0–8.3)
Total Bilirubin: 0.8 mg/dL (ref 0.3–1.2)

## 2014-02-05 MED ORDER — OXYCODONE HCL 5 MG PO TABS: 5.0000 mg | ORAL_TABLET | ORAL | Status: DC | PRN

## 2014-02-05 MED ORDER — ACETAMINOPHEN-CODEINE #4 300-60 MG PO TABS: 1.0000 | ORAL_TABLET | ORAL | Status: DC | PRN

## 2014-02-05 MED ORDER — ACETAMINOPHEN-CODEINE #3 300-30 MG PO TABS: 1.0000 | ORAL_TABLET | ORAL | Status: DC | PRN

## 2014-02-05 MED ORDER — ONDANSETRON HCL 4 MG PO TABS: 4.0000 mg | ORAL_TABLET | Freq: Four times a day (QID) | ORAL | Status: DC | PRN

## 2014-02-05 NOTE — Progress Notes (Signed)
Pt discharged home with spouse in stable condition. Discharge instructions and scripts given. Pt and spouse verbalized understanding.

## 2014-02-05 NOTE — Discharge Summary (Addendum)
Physician Discharge Summary  Saverio Kader WJX:[id number] DOB: November 07, 1955 DOA: 02/04/2014  PCP: Cathlean Cower, MD  Admit date: 02/04/2014 Discharge date: 02/05/2014  Time spent: 65 minutes  Recommendations for Outpatient Follow-up:  1. Followup with Cathlean Cower, MD in 1-2 weeks. Patient may need outpatient 2-D echo to followup on his mild to moderate aortic stenosis. 2. Patient will be called by Dr. Owens Loffler office, [place] [facility] for appointment time to be evaluated and arranged for an endoscopic ultrasound with possible biopsies  Discharge Diagnoses:  Principal Problem:   Pancreatic mass Active Problems:   DYSLIPIDEMIA   HEART MURMUR, SYSTOLIC   PREHYPERTENSION   Abdominal pain, unspecified site   Dehydration   Discharge Condition: Stable and improved  Diet recommendation: Regular  Filed Weights   02/04/14 1436 02/05/14 0516  Weight: 86.1 kg (189 lb 13.1 oz) 86.4 kg (190 lb 7.6 oz)    History of present illness:  Ronald Thompson is a 58 y.o. male  With history of cardiac murmur likely secondary to mild to moderate aortic stenosis per 2-D echo of 07/09/2010, hypertension, hyperlipidemia, history of testicular seminoma diagnosed in 1999 at [facility] per patient tumor wrapped around his heart and lungs results in the pericarditis, history of lumbar spine surgery in June of 2014 secondary to a ruptured disc who presents to the ED with sudden onset of abdominal pain started on the left side and radiating across his entire abdomen and also in the epigastric region radiating to his back. Patient describes the pain as a throbbing, burning, cramping pain which is constant and intermittent in its. He. Patient also with pain he states radiating to his chest with occasional feelings of indigestion. Patient endorses some nausea, generalized fatigue over several days. Patient denies any vomiting, no shortness of breath, no fever, no chills, no dysuria, no diarrhea, no constipation, no melena, no  hematemesis, no hematochezia.  Patient was seen in the emergency room comprehensive metabolic profile which was done was unremarkable. Lipase level was 31. Cardiac enzymes were negative x2. CBC was unremarkable. Acute abdominal series was negative. CT angiogram of the chest abdomen and pelvis was negative for dissection or aneurysm. Showed moderate elevation of left hemidiaphragm a fairly extensive scar type changes involving the left lung with no acute pulmonary findings. 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 2.6 cm pancreatic head lesion with borderline enlarged peripancreatic and celiac axis lymph nodes were noted with no ductal obstruction.  We were called to admit the patient for further evaluation and management.   Hospital Course:   #1 pancreatic mass  Patient had presented with abdominal pain radiating to his back. Patient was seen in the ED lab work which was done was unremarkable. CT of the abdomen and pelvis revealed a 2.6 cm lesion in the pancreatic head and borderline regional lymphadenopathy. Patient was admitted to a MedSurg floor and a GI consultation was obtained. Patient was placed on IV fluids, antiemetics, pain management, supportive care. Patient was seen in consultation by Dr. Gastroenterology who had recommended an endoscopic ultrasound with possible biopsies needed to be done for further evaluation and to rule out pancreatic cancer. Endoscopic ultrasound will be arranged with Dr. Owens Loffler of  [facility] gastroenterology and patient will be called for appointment time. Patient improved clinically was tolerating a regular diet abdominal pain was managed and patient was discharged in stable and improved condition. Patient will followup with gastroenterology as outpatient. Patient will be discharged on anti-medics and  empiric pain medication, while awaiting endoscopic ultrasound. #2 abdominal pain  Likely  secondary to problem #1. Lipase levels within normal limits. Patient was placed on a PPI. GI cocktail as needed. Patient was hydrated with IV fluids placed on pain management on supportive care. Patient improved clinically was tolerating a regular diet by day of discharge. See problem #1.  #3 elevated blood pressure/prehypertensio  Continued on home regimen of Cozaar.  #4 hyperlipidemia  Continued on home statin.  #5 dehydration  Patient noted to be dehydrated on admission. Patient was hydrated with IV fluids and was euvolemic by day of discharge.   #6 cardiac murmur/mild to moderate aortic stenosis Per patient this has been chronic. Patient asymptomatic. May need repeat 2-D echo done if an invasive procedures need to be done in the future. Will defer to PCP.  Procedures:  CT chest abdomen and pelvis 02/04/2014  Acute abdominal series 02/04/2014  Consultations:  Gastroenterology: Dr. Henrene Pastor 02/05/2014  Discharge Exam: Filed Vitals:   02/05/14 0516  BP: 109/65  Pulse: 75  Temp: 97.9 F (36.6 C)  Resp: 18    General: NAD Cardiovascular: RRR Respiratory: CTAB  Discharge Instructions You were cared for by a hospitalist during your hospital stay. If you have any questions about your discharge medications or the care you received while you were in the hospital after you are discharged, you can call the unit and asked to speak with the hospitalist on call if the hospitalist that took care of you is not available. Once you are discharged, your primary care physician will handle any further medical issues. Please note that NO REFILLS for any discharge medications will be authorized once you are discharged, as it is imperative that you return to your primary care physician (or establish a relationship with a primary care physician if you do not have one) for your aftercare needs so that they can reassess your need for medications and monitor your lab values.      Discharge Instructions    Diet general    Complete by:  As directed      Discharge instructions    Complete by:  As directed   Follow up with Cathlean Cower, MD in 1-2 weeks. Patient will be called with follow up appointment with Dr Ardis Hughs of [place] for appointment time.     Increase activity slowly    Complete by:  As directed             Medication List         acetaminophen-codeine 300-30 MG per tablet  Commonly known as:  TYLENOL #3  Take 1-2 tablets by mouth every 4 (four) hours as needed for moderate pain.     calcium carbonate 500 MG chewable tablet  Commonly known as:  TUMS - dosed in mg elemental calcium  Chew 3 tablets by mouth as needed for indigestion or heartburn.     ibuprofen 200 MG tablet  Commonly known as:  ADVIL,MOTRIN  Take 600 mg by mouth as needed.     losartan 50 MG tablet  Commonly known as:  COZAAR  Take 1 tablet (50 mg total) by mouth daily.     ondansetron 4 MG tablet  Commonly known as:  ZOFRAN  Take 1 tablet (4 mg total) by mouth every 6 (six) hours as needed for nausea or vomiting.     OVER THE COUNTER MEDICATION  Take 1 tablet by mouth daily.     oxyCODONE 5 MG immediate release tablet  Commonly known as:  Oxy IR/ROXICODONE  Take 1 tablet (5 mg total) by mouth every 4 (four) hours as needed for severe pain.     simvastatin 20 MG tablet  Commonly known as:  ZOCOR  Take 1 tablet (20 mg total) by mouth at bedtime.     VITAMIN B 12 PO  Take 1 tablet by mouth daily.       Allergies  Allergen Reactions  . Penicillins Hives    Entire body.   Follow-up Information   Follow up with Cathlean Cower, MD. Schedule an appointment as soon as possible for a visit in 1 week. (f/u in 1-2 weeks.)    Specialties:  Internal Medicine, Radiology   Contact information:   [place] [address] [phone]       Follow up with [name], MD. (Patient will be called with appointment time. )    Specialty:  Gastroenterology   Contact information:    [address] [place] [address] [phone]        The results of significant diagnostics from this hospitalization (including imaging, microbiology, ancillary and laboratory) are listed below for reference.    Significant Diagnostic Studies: Dg Abd Acute W/chest  02/04/2014   CLINICAL DATA:  Abdominal pain and bloating  EXAM: ACUTE ABDOMEN SERIES (ABDOMEN 2 VIEW & CHEST 1 VIEW)  COMPARISON:  None.  FINDINGS: Elevation of left hemidiaphragm is noted. The cardiac shadow is at the upper limits of normal in size. The lungs show some mild interstitial changes although no focal confluent infiltrate is seen.  Scattered large and small bowel gas is noted. Fecal material is noted within the colon. No true obstructive changes are seen. No free air is noted. The bony structures are within normal limits.  IMPRESSION: Nonspecific chest and abdomen. No definitive acute abnormality is seen.   Electronically Signed   By: Inez Catalina M.D.   On: 02/04/2014 10:14   Ct Angio Chest Aortic Dissect W &/or W/o  02/04/2014   CLINICAL DATA:  Chest and abdominal pain.  EXAM: CT ANGIOGRAPHY CHEST, ABDOMEN AND PELVIS  TECHNIQUE: Multidetector CT imaging through the chest, abdomen and pelvis was performed using the standard protocol during bolus administration of intravenous contrast. Multiplanar reconstructed images and MIPs were obtained and reviewed to evaluate the vascular anatomy.  CONTRAST:  169mL OMNIPAQUE IOHEXOL 350 MG/ML SOLN  COMPARISON:  None.  FINDINGS: CTA CHEST FINDINGS  Extensive chest wall collaterals are noted. 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 This could be related to prior Port-A-Cath or central line placement. No supraclavicular or axillary mass or adenopathy.  The bony thorax is intact. No destructive bone lesions or spinal canal compromise.  The heart is normal in size. No pericardial effusion. No mediastinal or hilar mass or adenopathy.  Scattered lymph nodes are noted. The aorta demonstrates mild tortuosity but no dissection or focal aneurysm. The branch vessels are patent. The pulmonary arteries appear normal. No findings for pulmonary embolism. The esophagus is grossly normal.  Examination of the lung parenchyma demonstrates fairly extensive scarring changes in the left lung along with eventration of the left hemidiaphragm. This could be due to radiation or prior infection. No worrisome pulmonary nodules. No pulmonary edema or pleural effusion.  Review of the MIP images confirms the above findings.  CTA ABDOMEN AND PELVIS FINDINGS  The abdominal aorta is normal in caliber. Minimal scattered atherosclerotic calcifications. No dissection. The branch  vessels are patent. There are 2 renal arteries bilaterally. The iliac arteries are normal except for scattered atherosclerotic calcifications. The common femoral arteries are patent.  The liver is unremarkable. There is mild diffuse fatty infiltration but no focal hepatic lesions or intrahepatic biliary dilatation. The gallbladder is unremarkable. No common bile duct dilatation. There is a slightly irregular 2.6 cm low-attenuation lesion in the pancreatic head. It measures between 26 and 29 Hounsfield units and could be a complex cyst but a neoplasm needs to be excluded. There is an adjacent borderline enlarged peripancreatic lymph node on image number 129 which measures 14 x 7 mm. The pancreatic duct is normal in caliber and has a normal course. The pancreatic body and tail are unremarkable. The spleen is normal in size. No focal lesions. The adrenal glands and kidneys are unremarkable. There is a simple appearing cyst associated with the right kidney.  The stomach, duodenum, small bowel and colon are grossly normal without oral contrast. No inflammatory changes, mass lesions or obstructive findings.  The bladder, prostate gland and seminal vesicles are unremarkable. No pelvic mass, adenopathy or free  pelvic fluid collections. No inguinal mass or adenopathy.  The bony structures are unremarkable.  Review of the MIP images confirms the above findings.  IMPRESSION: 1. No CT findings for thoracic or abdominal aortic dissection or aneurysm. 2. Marked elevation of the left hemidiaphragm and fairly extensive scarring type changes involving the left lung. No definite acute pulmonary findings. 3. 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 4. 2.6 cm pancreatic head lesion with borderline enlarged peripancreatic and celiac axis lymph nodes. No ductal obstruction. Recommend MRI abdomen without and with contrast for further evaluation. Recommend GI consultation. Patient may require endoscopic biopsy or PET-CT.   Electronically Signed   By: Kalman Jewels M.D.   On: 02/04/2014 12:19   Ct Angio Abd/pel W/ And/or W/o  02/04/2014   CLINICAL DATA:  Chest and abdominal pain.  EXAM: CT ANGIOGRAPHY CHEST, ABDOMEN AND PELVIS  TECHNIQUE: Multidetector CT imaging through the chest, abdomen and pelvis was performed using the standard protocol during bolus administration of intravenous contrast. Multiplanar reconstructed images and MIPs were obtained and reviewed to evaluate the vascular anatomy.  CONTRAST:  177mL OMNIPAQUE IOHEXOL 350 MG/ML SOLN  COMPARISON:  None.  FINDINGS: CTA CHEST FINDINGS  Extensive chest wall collaterals are noted. 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 This could be related to prior Port-A-Cath or central line placement. No supraclavicular or axillary mass or adenopathy.  The bony thorax is intact. No destructive bone lesions or spinal canal compromise.  The heart is normal in size. No pericardial effusion. No mediastinal or hilar mass or adenopathy. Scattered lymph nodes are noted. The aorta demonstrates mild tortuosity but no dissection or focal aneurysm. The branch vessels are patent.  The pulmonary arteries appear normal. No findings for pulmonary embolism. The esophagus is grossly normal.  Examination of the lung parenchyma demonstrates fairly extensive scarring changes in the left lung along with eventration of the left hemidiaphragm. This could be due to radiation or prior infection. No worrisome pulmonary nodules. No pulmonary edema or pleural effusion.  Review of the MIP images confirms the above findings.  CTA ABDOMEN AND PELVIS FINDINGS  The abdominal aorta is normal in caliber. Minimal scattered atherosclerotic calcifications. No dissection. The branch vessels are patent. There are 2 renal arteries bilaterally. The iliac arteries are normal except for scattered  atherosclerotic calcifications. The common femoral arteries are patent.  The liver is unremarkable. There is mild diffuse fatty infiltration but no focal hepatic lesions or intrahepatic biliary dilatation. The gallbladder is unremarkable. No common bile duct dilatation. There is a slightly irregular 2.6 cm low-attenuation lesion in the pancreatic head. It measures between 26 and 29 Hounsfield units and could be a complex cyst but a neoplasm needs to be excluded. There is an adjacent borderline enlarged peripancreatic lymph node on image number 129 which measures 14 x 7 mm. The pancreatic duct is normal in caliber and has a normal course. The pancreatic body and tail are unremarkable. The spleen is normal in size. No focal lesions. The adrenal glands and kidneys are unremarkable. There is a simple appearing cyst associated with the right kidney.  The stomach, duodenum, small bowel and colon are grossly normal without oral contrast. No inflammatory changes, mass lesions or obstructive findings.  The bladder, prostate gland and seminal vesicles are unremarkable. No pelvic mass, adenopathy or free pelvic fluid collections. No inguinal mass or adenopathy.  The bony structures are unremarkable.  Review of the MIP images confirms the  above findings.  IMPRESSION: 1. No CT findings for thoracic or abdominal aortic dissection or aneurysm. 2. Marked elevation of the left hemidiaphragm and fairly extensive scarring type changes involving the left lung. No definite acute pulmonary findings. 3. 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 4. 2.6 cm pancreatic head lesion with borderline enlarged peripancreatic and celiac axis lymph nodes. No ductal obstruction. Recommend MRI abdomen without and with contrast for further evaluation. Recommend GI consultation. Patient may require endoscopic biopsy or PET-CT.   Electronically Signed   By: Kalman Jewels M.D.   On: 02/04/2014 12:19    Microbiology: No results found for this or any previous visit (from the past 240 hour(s)).   Labs: Basic Metabolic Panel:  Recent Labs Lab 02/03/14 0951 02/04/14 0931 02/04/14 1550 02/05/14 0435  NA 139 137  --  136*  K 4.6 4.8  --  4.5  CL 100 99  --  100  CO2 32 27  --  28  GLUCOSE 100* 123*  --  98  BUN 14 20  --  15  CREATININE 1.0 0.99  --  1.05  CALCIUM 9.6 10.0  --  8.8  MG  --   --  1.7  --    Liver Function Tests:  Recent Labs Lab 02/03/14 0951 02/04/14 0931 02/05/14 0435  AST 30 24 19   ALT 35 28 21  ALKPHOS 59 63 54  BILITOT 1.4* 0.9 0.8  PROT 7.2 7.1 6.2  ALBUMIN 4.5 4.1 3.4*    Recent Labs Lab 02/04/14 0935  LIPASE 31   No results found for this basename: AMMONIA,  in the last 168 hours CBC:  Recent Labs Lab 02/03/14 0951 02/04/14 0931 02/05/14 0435  WBC 6.8 7.1 9.3  NEUTROABS 5.0 5.8  --   HGB 16.7 16.4 14.3  HCT 48.9 45.4 41.0  MCV 92.8 88.5 91.3  PLT 214.0 185 151   Cardiac Enzymes:  Recent Labs Lab 02/04/14 0931 02/04/14 1323  TROPONINI <0.30 <0.30   BNP: BNP (last 3 results) No results found for this basename: PROBNP,  in the last 8760 hours CBG: No results found for this basename: GLUCAP,  in the last 168  hours     Signed:  [facility] MD [facility] 02/05/2014, 1:31 PM

## 2014-02-05 NOTE — Consult Note (Signed)
HISTORY OF PRESENT ILLNESS:  Ronald Thompson is a 58 y.o. male with a remote history of testicular seminoma, hypertension, and hyperlipidemia. He presented to the hospital yesterday with upper abdominal pain radiating into the back. The discomfort began 2 days ago. He denies previous history of similar symptoms though he does report some mild early satiety in recent weeks. No weight loss. No other symptoms. Extensive laboratories were reviewed and found to be unremarkable. CT scan revealed 2.6 cm lesion in the pancreatic head and borderline regional lymphadenopathy. Without specific treatment, the patient is feeling better today. He is accompanied by his wife. No prior history of GI problems. Colonoscopy elsewhere 2006 unremarkable per the patient.  REVIEW OF SYSTEMS:  All non-GI ROS negative upon comprehensive review  Past Medical History  Diagnosis Date  . 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 Baker's cyst     Past Surgical History  Procedure Laterality Date  . Thoracotomy  1990    wedge biopsy of mediastinal mass    Social History Ronald Thompson  reports that he has never smoked. He has never used smokeless tobacco. He reports that he drinks alcohol. He reports that he does not use illicit drugs.  family history includes Cancer in his maternal aunt and paternal grandfather; Dementia in his mother; Emphysema in his maternal aunt.  Allergies  Allergen Reactions  . Penicillins Hives    Entire body.       PHYSICAL EXAMINATION: Vital signs: BP 109/65  Pulse 75  Temp(Src) 97.9 F (36.6 C) (Oral)  Resp 18  Ht 5\' 10"  (1.778 m)  Wt 190 lb 7.6 oz (86.4 kg)  BMI 27.33 kg/m2  SpO2 97% General: Well-developed, well-nourished, no acute distress HEENT: Sclerae are anicteric, conjunctiva pink. Oral mucosa intact Lungs: Clear Heart: Regular with soft systolic murmur Abdomen: soft, nontender, nondistended, no  obvious ascites, no peritoneal signs, normal bowel sounds. No organomegaly. Extremities: No edema Psychiatric: alert and oriented x3. Cooperative   ASSESSMENT:  #1. Upper abdominal pain with radiation to the back. Resolved. Possibly related to lesion in the pancreas seen on CT. See below #2. Abnormal CT scan of the abdomen with 2.6 cm pancreatic head lesion. Rule out pancreatic cancer #3. Remote history of testicular seminoma. Felt to be cured #4. General medical problems. Stable   PLAN:  #1. Next that would be endoscopic ultrasound with possible biopsies. This will need to be arranged with Dr. Owens Thompson. I will indicate with him. The patient and his wife are anxious to have this done as soon, as he is in the midst of changing jobs. He works in Audiological [employer]. #2[name] for discharge today. Sent home with empiric pain medication and antiemetics encase symptoms recur while waiting for endoscopic ultrasound. Discussed with the patient, his wife, and Dr. Grandville Thompson.   Ronald Thompson. Ronald Thompson., M.D. [facility] Division of Gastroenterology

## 2014-02-07 ENCOUNTER — Telehealth: Payer: Self-pay | Admitting: *Deleted

## 2014-02-07 NOTE — Telephone Encounter (Signed)
Pt stated he was giving 2 pain medications but he hasn't taking them yet. He stated while he was in the hosp they was giving him morphine. He didn't have a bowel movement until this morning. Wanting to know does the pain med that he was given cause constipation. Inform pt yes that one of the side effects to taking pain med. Advise him to take a stool softener when he takes pain med...Ronald Thompson

## 2014-02-08 ENCOUNTER — Encounter: Payer: Self-pay | Admitting: Internal Medicine

## 2014-02-08 ENCOUNTER — Ambulatory Visit (INDEPENDENT_AMBULATORY_CARE_PROVIDER_SITE_OTHER): Payer: BC Managed Care – PPO | Admitting: Internal Medicine

## 2014-02-08 ENCOUNTER — Other Ambulatory Visit: Payer: Self-pay

## 2014-02-08 ENCOUNTER — Encounter (HOSPITAL_COMMUNITY): Payer: Self-pay | Admitting: *Deleted

## 2014-02-08 ENCOUNTER — Telehealth: Payer: Self-pay

## 2014-02-08 VITALS — BP 132/80 | HR 97 | Temp 98.0°F | Wt 189.1 lb

## 2014-02-08 DIAGNOSIS — K8689 Other specified diseases of pancreas: Secondary | ICD-10-CM

## 2014-02-08 DIAGNOSIS — K869 Disease of pancreas, unspecified: Secondary | ICD-10-CM

## 2014-02-08 DIAGNOSIS — I359 Nonrheumatic aortic valve disorder, unspecified: Secondary | ICD-10-CM

## 2014-02-08 DIAGNOSIS — I350 Nonrheumatic aortic (valve) stenosis: Secondary | ICD-10-CM

## 2014-02-08 DIAGNOSIS — R109 Unspecified abdominal pain: Secondary | ICD-10-CM

## 2014-02-08 DIAGNOSIS — E785 Hyperlipidemia, unspecified: Secondary | ICD-10-CM

## 2014-02-08 NOTE — Progress Notes (Signed)
Pre visit review using our clinic review tool, if applicable. No additional management support is needed unless otherwise documented below in the visit note. 

## 2014-02-08 NOTE — Telephone Encounter (Signed)
Message copied by Barron Alvine on Tue Feb 08, 2014  8:32 AM ------      Message from: [name]      Created: Sun Feb 06, 2014  8:13 PM      Regarding: RE: Needs EUS evaluation of pancreas with possible biopsy       John,      I looked at the CT. Appears cystic.  I see no solid component or main ductal abnormality, at this size it less likely to be anything serious.  Regional adenopathy raises concern however.  We'll get him in for EUS this week.            Wille Aubuchon,      He needs upper EUS, radial +/- linear, ++MAC, this week sometime.  I already have a Thursday EUS so would prefer it to be a different day if possible-- but if Thursday is only available MAC spot then I'll take it.            Thanks            ----- Message -----         From: Irene Shipper, MD         Sent: 02/05/2014  12:50 PM           To: [name], MD      Subject: Needs EUS evaluation of pancreas with possib#            Dan,      Really nice man I saw in the hospital for abdominal pain and pancreatic head lesion. Local banker. He date EUS with possible biopsy. He is otherwise in good shape. Remote history of seminoma. He and his wife are young, active, and understandably anxious. Obviously, sooner the better. Thanks            John       ------

## 2014-02-08 NOTE — Telephone Encounter (Signed)
Left message on machine to call back  

## 2014-02-08 NOTE — Patient Instructions (Signed)
Please continue all other medications as before, and refills have been done if requested.  Please have the pharmacy call with any other refills you may need.  Please keep your appointments with your specialists as you may have planned  Please see Dr Ardis Hughs office today for appt   You will be contacted regarding the referral for: echocardiogram

## 2014-02-08 NOTE — Telephone Encounter (Signed)
EUS scheduled, pt instructed and medications reviewed.  Patient instructions mailed to home.  Patient to call with any questions or concerns.  

## 2014-02-08 NOTE — Telephone Encounter (Signed)
Messages left at all available numbers

## 2014-02-09 ENCOUNTER — Encounter (HOSPITAL_COMMUNITY): Payer: Self-pay | Admitting: Pharmacy Technician

## 2014-02-09 ENCOUNTER — Encounter: Payer: Self-pay | Admitting: Gastroenterology

## 2014-02-10 ENCOUNTER — Encounter (HOSPITAL_COMMUNITY): Payer: BC Managed Care – PPO | Admitting: Anesthesiology

## 2014-02-10 ENCOUNTER — Encounter (HOSPITAL_COMMUNITY): Payer: Self-pay | Admitting: Certified Registered"

## 2014-02-10 ENCOUNTER — Encounter (HOSPITAL_COMMUNITY)
Admission: RE | Disposition: A | Discharge status: Home / Self Care | Payer: Self-pay | Source: Ambulatory Visit | Attending: Gastroenterology

## 2014-02-10 ENCOUNTER — Ambulatory Visit (HOSPITAL_COMMUNITY): Payer: BC Managed Care – PPO | Admitting: Anesthesiology

## 2014-02-10 ENCOUNTER — Telehealth: Payer: Self-pay

## 2014-02-10 ENCOUNTER — Ambulatory Visit (HOSPITAL_COMMUNITY)
Admission: RE | Admit: 2014-02-10 | Discharge: 2014-02-10 | Disposition: A | Discharge status: Home / Self Care | Payer: BC Managed Care – PPO | Source: Ambulatory Visit | Attending: Gastroenterology | Admitting: Gastroenterology

## 2014-02-10 DIAGNOSIS — K869 Disease of pancreas, unspecified: Secondary | ICD-10-CM

## 2014-02-10 DIAGNOSIS — K8689 Other specified diseases of pancreas: Secondary | ICD-10-CM | POA: Insufficient documentation

## 2014-02-10 DIAGNOSIS — I10 Essential (primary) hypertension: Secondary | ICD-10-CM | POA: Insufficient documentation

## 2014-02-10 DIAGNOSIS — D3A098 Benign carcinoid tumors of other sites: Secondary | ICD-10-CM | POA: Insufficient documentation

## 2014-02-10 DIAGNOSIS — E785 Hyperlipidemia, unspecified: Secondary | ICD-10-CM | POA: Insufficient documentation

## 2014-02-10 DIAGNOSIS — Z8547 Personal history of malignant neoplasm of testis: Secondary | ICD-10-CM | POA: Insufficient documentation

## 2014-02-10 DIAGNOSIS — R109 Unspecified abdominal pain: Secondary | ICD-10-CM | POA: Insufficient documentation

## 2014-02-10 DIAGNOSIS — R599 Enlarged lymph nodes, unspecified: Secondary | ICD-10-CM | POA: Insufficient documentation

## 2014-02-10 DIAGNOSIS — K7689 Other specified diseases of liver: Secondary | ICD-10-CM | POA: Insufficient documentation

## 2014-02-10 DIAGNOSIS — C259 Malignant neoplasm of pancreas, unspecified: Secondary | ICD-10-CM

## 2014-02-10 HISTORY — DX: Pure hypercholesterolemia, unspecified: E78.00

## 2014-02-10 HISTORY — DX: Cardiac murmur, unspecified: R01.1

## 2014-02-10 HISTORY — DX: Solitary pulmonary nodule: R91.1

## 2014-02-10 HISTORY — DX: Personal history of other medical treatment: Z92.89

## 2014-02-10 HISTORY — PX: EUS: SHX5427

## 2014-02-10 SURGERY — UPPER ENDOSCOPIC ULTRASOUND (EUS) LINEAR
Anesthesia: Monitor Anesthesia Care

## 2014-02-10 MED ORDER — PROPOFOL INFUSION 10 MG/ML OPTIME
INTRAVENOUS | Status: DC | PRN
  Administered 2014-02-10: 140 ug/kg/min via INTRAVENOUS

## 2014-02-10 MED ORDER — PROPOFOL 10 MG/ML IV BOLUS
INTRAVENOUS | Status: AC
  Filled 2014-02-10: qty 20

## 2014-02-10 MED ORDER — PROMETHAZINE HCL 25 MG/ML IJ SOLN: 6.2500 mg | INTRAMUSCULAR | Status: DC | PRN

## 2014-02-10 MED ORDER — LACTATED RINGERS IV SOLN
INTRAVENOUS | Status: DC
  Administered 2014-02-10: 1000 mL via INTRAVENOUS

## 2014-02-10 MED ORDER — SODIUM CHLORIDE 0.9 % IV SOLN: INTRAVENOUS | Status: DC

## 2014-02-10 MED ORDER — PROPOFOL 10 MG/ML IV BOLUS
INTRAVENOUS | Status: DC | PRN
  Administered 2014-02-10: 50 mg via INTRAVENOUS

## 2014-02-10 MED ORDER — CIPROFLOXACIN IN D5W 400 MG/200ML IV SOLN
INTRAVENOUS | Status: AC
  Filled 2014-02-10: qty 200

## 2014-02-10 NOTE — Addendum Note (Signed)
Addendum created 02/10/14 1016 by Verlin Grills, CRNA   Modules edited: Charges VN

## 2014-02-10 NOTE — Discharge Instructions (Signed)

## 2014-02-10 NOTE — H&P (View-Only) (Signed)
HISTORY OF PRESENT ILLNESS:  Ronald Thompson is a 58 y.o. male with a remote history of testicular seminoma, hypertension, and hyperlipidemia. He presented to the hospital yesterday with upper abdominal pain radiating into the back. The discomfort began 2 days ago. He denies previous history of similar symptoms though he does report some mild early satiety in recent weeks. No weight loss. No other symptoms. Extensive laboratories were reviewed and found to be unremarkable. CT scan revealed 2.6 cm lesion in the pancreatic head and borderline regional lymphadenopathy. Without specific treatment, the patient is feeling better today. He is accompanied by his wife. No prior history of GI problems. Colonoscopy elsewhere 2006 unremarkable per the patient.  REVIEW OF SYSTEMS:  All non-GI ROS negative upon comprehensive review  Past Medical History  Diagnosis Date  . 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 Baker's cyst     Past Surgical History  Procedure Laterality Date  . Thoracotomy  1990    wedge biopsy of mediastinal mass    Social History Ronald Thompson  reports that he has never smoked. He has never used smokeless tobacco. He reports that he drinks alcohol. He reports that he does not use illicit drugs.  family history includes Cancer in his maternal aunt and paternal grandfather; Dementia in his mother; Emphysema in his maternal aunt.  Allergies  Allergen Reactions  . Penicillins Hives    Entire body.       PHYSICAL EXAMINATION: Vital signs: BP 109/65  Pulse 75  Temp(Src) 97.9 F (36.6 C) (Oral)  Resp 18  Ht 5\' 10"  (1.778 m)  Wt 190 lb 7.6 oz (86.4 kg)  BMI 27.33 kg/m2  SpO2 97% General: Well-developed, well-nourished, no acute distress HEENT: Sclerae are anicteric, conjunctiva pink. Oral mucosa intact Lungs: Clear Heart: Regular with soft systolic murmur Abdomen: soft, nontender, nondistended, no  obvious ascites, no peritoneal signs, normal bowel sounds. No organomegaly. Extremities: No edema Psychiatric: alert and oriented x3. Cooperative   ASSESSMENT:  #1. Upper abdominal pain with radiation to the back. Resolved. Possibly related to lesion in the pancreas seen on CT. See below #2. Abnormal CT scan of the abdomen with 2.6 cm pancreatic head lesion. Rule out pancreatic cancer #3. Remote history of testicular seminoma. Felt to be cured #4. General medical problems. Stable   PLAN:  #1. Next that would be endoscopic ultrasound with possible biopsies. This will need to be arranged with Dr. Owens Loffler. I will indicate with him. The patient and his wife are anxious to have this done as soon, as he is in the midst of changing jobs. He works in Audiological scientist. #2[name] for discharge today. Sent home with empiric pain medication and antiemetics encase symptoms recur while waiting for endoscopic ultrasound. Discussed with the patient, his wife, and Dr. Grandville Silos.   Docia Chuck. Geri Seminole., M.D. [facility] Division of Gastroenterology

## 2014-02-10 NOTE — Telephone Encounter (Signed)
Message copied by Barron Alvine on Thu Feb 10, 2014 10:33 AM ------      Message from: Owens Loffler P      Created: Thu Feb 10, 2014 10:09 AM       Ronald Thompson,      He needs referral to medical oncology. Dr. [name], hopefully next week, for newly diagnosed metastatic pancreatic high grade neuroendocrine cancer.            Barnett Applebaum, can you help with referral above? Also next week GI cancer conference.  Thanks            Ronald Thompson,       Ronald Thompson            EUS today:       3.2cm mass in head/uncinate pancreas that does not obstruct the bile      duct or involve any significant nearby blood vessels.  There were      also mutliple (7-8) masses in the liver that appeared metastatic.      The pancreatic mass and one of the liver lesions were sampled; both      show high grade neuroendocrine carcinoma.  He will be referred to      medical oncology. ------

## 2014-02-10 NOTE — Anesthesia Preprocedure Evaluation (Signed)
Anesthesia Evaluation  Patient identified by MRN, date of birth, ID band Patient awake    Reviewed: Allergy & Precautions, H&P , NPO status , Patient's Chart, lab work & pertinent test results  Airway Mallampati: II TM Distance: >3 FB Neck ROM: Full    Dental no notable dental hx.    Pulmonary neg pulmonary ROS,  breath sounds clear to auscultation  Pulmonary exam normal       Cardiovascular hypertension, Rhythm:Regular Rate:Normal     Neuro/Psych negative neurological ROS  negative psych ROS   GI/Hepatic negative GI ROS, Neg liver ROS,   Endo/Other  negative endocrine ROS  Renal/GU negative Renal ROS  negative genitourinary   Musculoskeletal negative musculoskeletal ROS (+)   Abdominal   Peds negative pediatric ROS (+)  Hematology negative hematology ROS (+)   Anesthesia Other Findings   Reproductive/Obstetrics negative OB ROS                           Anesthesia Physical Anesthesia Plan  ASA: II  Anesthesia Plan: MAC   Post-op Pain Management:    Induction: Intravenous  Airway Management Planned: Nasal Cannula  Additional Equipment:   Intra-op Plan:   Post-operative Plan:   Informed Consent: I have reviewed the patients History and Physical, chart, labs and discussed the procedure including the risks, benefits and alternatives for the proposed anesthesia with the patient or authorized representative who has indicated his/her understanding and acceptance.   Dental advisory given  Plan Discussed with: CRNA and Surgeon  Anesthesia Plan Comments:         Anesthesia Quick Evaluation

## 2014-02-10 NOTE — Telephone Encounter (Signed)
Referral has been made.

## 2014-02-10 NOTE — Op Note (Signed)
[facility] [place], [address]   ENDOSCOPIC ULTRASOUND PROCEDURE REPORT  PATIENT: Ronald Thompson, Ronald Thompson  MR#: [id number] BIRTHDATE: 04/01/1956  GENDER: Male ENDOSCOPIST: Ronald Banister, MD REFERRED BY:  Eustace Quail, M.D. PROCEDURE DATE:  02/10/2014 PROCEDURE:   Upper EUS w/FNA ASA CLASS:      Class II INDICATIONS:   1.  recent left side chest pains, ER visit, CT scan showed mass in head of pancreas, normal liver tests; non-drinker, no weight loss, never pancreatic disease in patient or his family; remote history of left chest seminoma treated at [facility] with chemo/XRT. MEDICATIONS: MAC sedation, administered by CRNA  DESCRIPTION OF PROCEDURE:   After the risks benefits and alternatives of the procedure were  explained, informed consent was obtained. The patient was then placed in the left, lateral, decubitus postion and IV sedation was administered. Throughout the procedure, the patients blood pressure, pulse and oxygen saturations were monitored continuously.  Under direct visualization, the EUS scope 0383  endoscope was introduced through the mouth  and advanced to the second portion of the duodenum . Water was used as necessary to provide an acoustic interface.  Upon completion of the imaging, water was removed and the patient was sent to the recovery room in satisfactory condition.  Endoscopic findings: 1. Normal UGI tract  EUS findings: 1. Hypoechoic, irregularly bordered, solid 3.2cm mass in head/uncinate pancreas. The mass does not involve SMA, SMV, celiac trunk or portal vein.  The mass was sampled with three transduodenal passes with a 25 gauge EUS FNA needle. Pancreatic parenchyma was otherwise normal. 2. Main pancreatic duct was normal. 3. CBD was normal, non-dilated. 4. Numerous hyperechoic masses in his visualized liver (at least 7-8 masses in liver). These ranged in size from 1cm to 3cm, they were round and clearly metastatic.  One  of the liver masses located in left lobe of liver was sampled with 2 transgastric passes with a different 25 gauge EUS FNA needle.  Impression: 3.2cm mass in head/uncinate pancreas that does not obstruct the bile duct or involve any significant nearby blood vessels.  There were also mutliple (7-8) masses in the liver that appeared metastatic. The pancreatic mass and one of the liver lesions were sampled; both show high grade neuroendocrine carcinoma.  He will be referred to medical oncology.   _______________________________ eSignedMilus Banister, MD 02/10/2014 9:48 AM   PATIENT NAME:  Ronald Thompson, Ronald Thompson MR#: [id number]

## 2014-02-10 NOTE — Anesthesia Postprocedure Evaluation (Signed)
  Anesthesia Post-op Note  Patient: Ronald Thompson  Procedure(s) Performed: Procedure(s) (LRB): UPPER ENDOSCOPIC ULTRASOUND (EUS) LINEAR (N/A)  Patient Location: PACU  Anesthesia Type: MAC  Level of Consciousness: awake and alert   Airway and Oxygen Therapy: Patient Spontanous Breathing  Post-op Pain: mild  Post-op Assessment: Post-op Vital signs reviewed, Patient's Cardiovascular Status Stable, Respiratory Function Stable, Patent Airway and No signs of Nausea or vomiting  Last Vitals:  Filed Vitals:   02/10/14 1000  BP: 119/76  Temp:   Resp:     Post-op Vital Signs: stable   Complications: No apparent anesthesia complications

## 2014-02-10 NOTE — Interval H&P Note (Signed)
History and Physical Interval Note:  02/10/2014 7:50 AM  Ronald Thompson  has presented today for surgery, with the diagnosis of Pancreatic lesion [577.9]  The various methods of treatment have been discussed with the patient and family. After consideration of risks, benefits and other options for treatment, the patient has consented to  Procedure(s): UPPER ENDOSCOPIC ULTRASOUND (EUS) LINEAR (N/A) as a surgical intervention .  The patient's history has been reviewed, patient examined, no change in status, stable for surgery.  I have reviewed the patient's chart and labs.  Questions were answered to the patient's satisfaction.     [name]

## 2014-02-10 NOTE — Transfer of Care (Signed)
Immediate Anesthesia Transfer of Care Note  Patient: Ronald Thompson  Procedure(s) Performed: Procedure(s) (LRB): UPPER ENDOSCOPIC ULTRASOUND (EUS) LINEAR (N/A)  Patient Location: PACU  Anesthesia Type: MAC  Level of Consciousness: sedated, patient cooperative and responds to stimulation  Airway & Oxygen Therapy: Patient Spontanous Breathing and Patient connected to face mask oxgen  Post-op Assessment: Report given to PACU RN and Post -op Vital signs reviewed and stable  Post vital signs: Reviewed and stable  Complications: No apparent anesthesia complications

## 2014-02-11 ENCOUNTER — Telehealth: Payer: Self-pay | Admitting: *Deleted

## 2014-02-11 ENCOUNTER — Encounter (HOSPITAL_COMMUNITY): Payer: Self-pay | Admitting: Gastroenterology

## 2014-02-11 NOTE — Telephone Encounter (Signed)
Spoke with patient by phone and confirmed appointment with Dr. [name] for 02/14/14.  Contact name, numbers, and directions were provided.

## 2014-02-13 NOTE — Assessment & Plan Note (Signed)
Pain some improved today, for GI eval as planned

## 2014-02-13 NOTE — Assessment & Plan Note (Signed)
D/w pt, to hold on further tx at this time pending other evaluation as above Lab Results  Component Value Date   LDLCALC 114* 02/03/2014

## 2014-02-13 NOTE — Progress Notes (Signed)
   Subjective:    Patient ID: Ronald Thompson, male    DOB: [birth year], 58 y.o.   MRN: [id number]  HPI  Here to f/u, overall doing ok after hospn recently with abd pain, finding of pancreatic mass, unclear etiology.  Post hospn still doing ok, pain tolerable , some improved today, no radiatoin, no n/v, bowel change, fever, recent wt loss.  Has GI fu planned.  No new complaints  Pt denies chest pain, increased sob or doe, wheezing, orthopnea, PND, increased LE swelling, palpitations, dizziness or syncope. Has not yet been contacted regarding the echo. Past Medical History  Diagnosis Date  . 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 Baker's cyst   . Hypertension   . Heart murmur   . Transfusion history     hx. 25 yrs ago-during cancer tx.  . 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 Lesion of left lung     hx.25 yrs ago- testicilar cancer related- only scarring left after tx.-no problems now  . Hypercholesteremia    Past Surgical History  Procedure Laterality Date  . Thoracotomy  1990    wedge biopsy of mediastinal mass  . Back surgery      '14- rupt. disc   . Knee surgery      age 38 for Baker's cyst  . Eus N/A 02/10/2014    Procedure: UPPER ENDOSCOPIC ULTRASOUND (EUS) LINEAR;  Surgeon: [name], MD;  Location: [facility] ENDOSCOPY;  Service: Endoscopy;  Laterality: N/A;    reports that he has never smoked. He has never used smokeless tobacco. He reports that he drinks alcohol. He reports that he does not use illicit drugs. family history includes Cancer in his maternal aunt and paternal grandfather; Dementia in his mother; Emphysema in his maternal aunt. Allergies  Allergen Reactions  . Penicillins Hives    Entire body.   Current Outpatient Prescriptions on File Prior to Visit  Medication Sig Dispense Refill  . calcium carbonate (TUMS - DOSED IN MG ELEMENTAL CALCIUM) 500 MG chewable tablet Chew 3 tablets by mouth as needed for  indigestion or heartburn.      . Cyanocobalamin (VITAMIN B 12 PO) Take 1 tablet by mouth daily.      Marland Kitchen ibuprofen (ADVIL,MOTRIN) 200 MG tablet Take 600 mg by mouth every 6 (six) hours as needed for mild pain or moderate pain.        No current facility-administered medications on file prior to visit.   Review of Systems All otherwise neg per pt     Objective:   Physical Exam BP 132/80  Pulse 97  Temp(Src) 98 F (36.7 C) (Oral)  Wt 189 lb 2 oz (85.787 kg)  SpO2 97% VS noted,  Constitutional: Pt appears well-developed, well-nourished.  HENT: Head: NCAT.  Right Ear: External ear normal.  Left Ear: External ear normal.  Eyes: . Pupils are equal, round, and reactive to light. Conjunctivae and EOM are normal Neck: Normal range of motion. Neck supple.  Cardiovascular: Normal rate and regular rhythm. With gr 2/6 mass  Pulmonary/Chest: Effort normal and breath sounds normal.  Abd:  Soft, NT, ND, + BS Neurological: Pt is alert. Not confused , motor grossly intact Skin: Skin is warm. No rash Psychiatric: Pt behavior is normal. No agitation.     Assessment & Plan:

## 2014-02-13 NOTE — Assessment & Plan Note (Signed)
Still need echo, will f/u order, asympt,  to f/u any worsening symptoms or concerns

## 2014-02-14 ENCOUNTER — Encounter: Payer: Self-pay | Admitting: Oncology

## 2014-02-14 ENCOUNTER — Ambulatory Visit: Payer: BC Managed Care – PPO

## 2014-02-14 ENCOUNTER — Telehealth: Payer: Self-pay | Admitting: Oncology

## 2014-02-14 ENCOUNTER — Ambulatory Visit (HOSPITAL_BASED_OUTPATIENT_CLINIC_OR_DEPARTMENT_OTHER): Payer: BC Managed Care – PPO

## 2014-02-14 ENCOUNTER — Ambulatory Visit (HOSPITAL_BASED_OUTPATIENT_CLINIC_OR_DEPARTMENT_OTHER): Payer: BC Managed Care – PPO | Admitting: Oncology

## 2014-02-14 VITALS — BP 133/85 | HR 79 | Temp 98.7°F | Resp 18 | Ht 70.0 in | Wt 187.3 lb

## 2014-02-14 DIAGNOSIS — R0609 Other forms of dyspnea: Secondary | ICD-10-CM

## 2014-02-14 DIAGNOSIS — D3A8 Other benign neuroendocrine tumors: Secondary | ICD-10-CM

## 2014-02-14 DIAGNOSIS — R0989 Other specified symptoms and signs involving the circulatory and respiratory systems: Secondary | ICD-10-CM

## 2014-02-14 DIAGNOSIS — C787 Secondary malignant neoplasm of liver and intrahepatic bile duct: Secondary | ICD-10-CM

## 2014-02-14 DIAGNOSIS — C7A098 Malignant carcinoid tumors of other sites: Secondary | ICD-10-CM

## 2014-02-14 DIAGNOSIS — I359 Nonrheumatic aortic valve disorder, unspecified: Secondary | ICD-10-CM

## 2014-02-14 DIAGNOSIS — C761 Malignant neoplasm of thorax: Secondary | ICD-10-CM

## 2014-02-14 HISTORY — DX: Other benign neuroendocrine tumors: D3A.8

## 2014-02-14 NOTE — Progress Notes (Signed)
Checked in new patient with no issues prior to seeing the dr. He [place] not been out of the country.

## 2014-02-14 NOTE — Telephone Encounter (Signed)
gv adn rpinted appt sched and avs for pt for July....Marland Kitchensent pt to lab

## 2014-02-14 NOTE — Progress Notes (Signed)
[facility] Patient Consult   Referring MD: Jorge Retz 58 y.o.  21-Nov-1955    Reason for Referral: Pancreatic neuroendocrine tumor   HPI: He presented to the emergency room 02/04/2014 with the acute onset of upper abdominal pain with mild associated nausea. No vomiting. 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 Scarring changes in the left lung with deep infiltration of the left hemidiaphragm. No pulmonary nodules. No pulmonary embolism. The liver appeared unremarkable with mild diffuse fatty infiltration, but no focal lesion. A 2.6 cm low attenuation lesion was noted in the head of the pancreas. Adjacent borderline enlarged peripancreatic lymph node measured 14 x 7 mm. The pancreatic duct appeared normal. The spleen appeared normal in size.  He was discharged to home 02/05/2014 and reports the pain has not recurred. He was referred to Dr. Ardis Hughs and was taken to an endoscopic ultrasound procedure 02/10/2014. The upper GI tract appeared normal by endoscopy. A hypoechoic irregular 3.2 cm solid mass was noted in the head/aunts and 8 of the pancreas. The mass did not involve major vessels. The mass was biopsied. The pancreatic parenchyma was otherwise normal. Numerous hyperechoic masses were in visualizing the liver. The masses ranged in size from 1-3 cm. A mass in the left liver was biopsied.  The cytology from the head of the pancreas mass and left liver lesion confirmed a neuroendocrine tumor with associated tumor necrosis. Immunohistochemical stains on the liver specimen family tumor cells positive for CK AE1/AE3, chromogranin, and synaptophysin. Negative for CD X2 and TTF-1. The Ki 67 perforation marker returned at less than 20%. The findings were consistent with a pancreatic neuroendocrine tumor, intermediate grade.   He feels well at present. Past Medical  History  Diagnosis Date  . 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 Baker's cyst   . Hypertension   . Heart murmur/aortic stenosis    . Transfusion history     hx. 25 yrs ago-during cancer tx.  .  chest seminoma-3 with BEP and radiation at [facility]       .  tumor associated DVT, venous engorgement of the left chest and arm   1990       . Hypercholesteremia     Past Surgical History  Procedure Laterality Date  . Thoracotomy  1990    wedge biopsy of mediastinal mass  . Back surgery      '14- rupt. disc   . Knee surgery      age 24 for Baker's cyst  . Eus N/A 02/10/2014    Procedure: UPPER ENDOSCOPIC ULTRASOUND (EUS) LINEAR;  Surgeon: [name], MD;  Location: [facility];  Service: Endoscopy;  Laterality: N/A;    .   Nasal septum surgery-and deviated septum  Medications: Reviewed  Allergies:  Allergies  Allergen Reactions  . Penicillins Hives    Entire body.    Family history: His mother had multiple myeloma in her 68s, a maternal aunt died of colon cancer, his maternal grandfather had esophagus cancer and was a smoker. No other family history of cancer. He has 3 children.  Social History:   He lives in [place]. He works in Science writer. He does not use tobacco. He received transfusions in 1990. No risk factor for HIV or hepatitis.  History  Alcohol Use  . Yes    Comment: rare- social    History  Smoking status  . Never Smoker   Smokeless tobacco  . Never Used      ROS:   Positives include: Acute abdominal pain 02/04/2014, chronic venous engorgement of the left chest and arm, intermittent "aching "in the left arm, exertional dyspnea since treatment for the chest seminoma in 1990  A complete ROS was otherwise negative.  Physical Exam:  Blood pressure 133/85, pulse 79, temperature 98.7 F (37.1 C), temperature source Oral, resp. rate 18, height '5\' 10"'  (1.778 m), weight 187 lb 4.8 oz (84.959 kg), SpO2 100.00%.  HEENT: Oropharynx without visible mass, neck  without mass Lungs: Decreased breath sounds at the left lower chest, no respiratory distress Cardiac: Regular rate and rhythm with premature beats, 2/6 systolic murmur Abdomen: No hepatosplenomegaly, nontender, no mass. Soft fullness in the suprapubic region (he reports this is a chronic finding) GU: Testes without mass  Vascular: No leg edema Lymph nodes: No cervical, supraclavicular, axillary, or inguinal nodes Neurologic: Alert and oriented, the motor exam appears intact in the upper and lower extremities Skin: No rash Musculoskeletal: No spine tenderness   LAB:  CBC  Lab Results  Component Value Date   WBC 9.3 02/05/2014   HGB 14.3 02/05/2014   HCT 41.0 02/05/2014   MCV 91.3 02/05/2014   PLT 151 02/05/2014   NEUTROABS 5.8 02/04/2014     CMP      Component Value Date/Time   NA 136* 02/05/2014 0435   K 4.5 02/05/2014 0435   CL 100 02/05/2014 0435   CO2 28 02/05/2014 0435   GLUCOSE 98 02/05/2014 0435   BUN 15 02/05/2014 0435   CREATININE 1.05 02/05/2014 0435   CALCIUM 8.8 02/05/2014 0435   PROT 6.2 02/05/2014 0435   ALBUMIN 3.4* 02/05/2014 0435   AST 19 02/05/2014 0435   ALT 21 02/05/2014 0435   ALKPHOS 54 02/05/2014 0435   BILITOT 0.8 02/05/2014 0435   GFRNONAA 75* 02/05/2014 0435   GFRAA 87* 02/05/2014 0435    Imaging:  As per history of present illness, I reviewed the 02/04/2014 CT images with Mr.Woodring and his wife   Assessment/Plan:   1. Pancreatic neuroendocrine tumor, WHO grade 2, pancreatic head mass and small peripancreatic/celiac nodes on a CT 02/04/2014  EUS revealed evidence of multiple liver metastases-status post an FNA biopsy of a left liver lesion 02/10/2014 confirming a neuroendocrine tumor    2. Chest Seminoma in 1990 treated with BEP and chest radiation at [facility]  3.  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  chronic exertional  dyspnea following treatment for the seminoma  5.  aortic stenosis on an echocardiogram December 2011  6.  lumbar disc surgery 2014  7.  acute abdominal pain 02/04/2014-resolved   Disposition:   Mr. Auxier has been diagnosed with a metastatic pancreatic neuroendocrine tumor. It is unclear whether his presentation with acute abdominal pain 02/04/2014 was related to the tumor. He does not have symptoms related to the tumor at present. He has no symptoms of a carcinoid tumor or functioning islet cell tumor.  I discussed the diagnosis and treatment options with Mr. Lafuente and his wife. He understands it is likely no therapy will be curative. He will need additional staging evaluation. We checked a chromogranin A level today and we will consider a baseline liver MRI or PET scan to evaluate the liver lesions seen on EUS. Liver lesions  were not confirmed on a contrast CT.  I explained there are various systemic treatment options available including a Sandostatin analog, small molecule TKIs, systemic chemotherapy , and M-tor inhibitors.  I cannot relate the neuroendocrine carcinoma to the diagnosis of a mediastinal seminoma in 1990.  Mr. Dhondt agrees to a referral to Dr. Leamon Arnt for review of the pathology and treatment recommendations. He will return for an office visit here in 2-3 weeks.  [place], [place] 02/14/2014, 2:21 PM

## 2014-02-15 ENCOUNTER — Ambulatory Visit: Payer: BC Managed Care – PPO | Admitting: Internal Medicine

## 2014-02-15 ENCOUNTER — Other Ambulatory Visit: Payer: Self-pay

## 2014-02-15 MED ORDER — LOSARTAN POTASSIUM 50 MG PO TABS: 50.0000 mg | ORAL_TABLET | Freq: Every morning | ORAL | Status: DC

## 2014-02-17 ENCOUNTER — Telehealth: Payer: Self-pay | Admitting: *Deleted

## 2014-02-17 NOTE — Telephone Encounter (Signed)
Left VM requesting to speak with Maudie Mercury in regards to his referral at [facility] with Dr. Leamon Arnt. Forwarded message to [employer]. In HIM.

## 2014-02-18 ENCOUNTER — Telehealth: Payer: Self-pay | Admitting: Oncology

## 2014-02-18 LAB — CHROMOGRANIN A: Chromogranin A: 63 ng/mL — ABNORMAL HIGH (ref <=15)

## 2014-02-18 NOTE — Telephone Encounter (Signed)
Pt appt. To see Dr. Leamon Arnt @ [facility] is 03/01/14@11 :39. Faxed medical records.  Pt is aware to hand carry cd to appt.

## 2014-02-21 ENCOUNTER — Encounter: Payer: Self-pay | Admitting: Oncology

## 2014-02-21 ENCOUNTER — Telehealth: Payer: Self-pay | Admitting: *Deleted

## 2014-02-21 NOTE — Telephone Encounter (Signed)
Spoke with patient by phone and introduced self and role of navigator.  Answered questions (based on MD note from 02/14/14) re: MD plan of care.  Patient is aware of appointments with Dr. Leamon Arnt at [facility] on 03/01/14 and with Dr. [name] on 03/02/14.  Explained process of how to submit FMLA paperwork for MD  to complete.  Patient was without further questions and expressed appreciation for the call.

## 2014-02-21 NOTE — Progress Notes (Signed)
Put fmla form on nurse's desk °

## 2014-02-23 ENCOUNTER — Encounter: Payer: Self-pay | Admitting: Oncology

## 2014-02-23 NOTE — Progress Notes (Signed)
Put fmla form in registration desk °

## 2014-03-01 ENCOUNTER — Encounter: Payer: Self-pay | Admitting: Oncology

## 2014-03-01 ENCOUNTER — Telehealth: Payer: Self-pay | Admitting: *Deleted

## 2014-03-01 NOTE — Telephone Encounter (Signed)
OK to reschedule 7/29 to 03/08/14 at 4pm per Dr. [name]. Patient agrees to change.

## 2014-03-01 NOTE — Progress Notes (Signed)
Put disability form on nurse's desk. °

## 2014-03-01 NOTE — Telephone Encounter (Signed)
Needs to cancel his appointment here tomorrow and reschedule for next week. Saw Dr. Leamon Arnt at [facility] today, but he wants to look over his path in more detail before making his recommendations. Feels best to see Dr. [name] after this is done.

## 2014-03-02 ENCOUNTER — Ambulatory Visit: Payer: BC Managed Care – PPO | Admitting: Oncology

## 2014-03-08 ENCOUNTER — Ambulatory Visit (HOSPITAL_BASED_OUTPATIENT_CLINIC_OR_DEPARTMENT_OTHER): Payer: BC Managed Care – PPO | Admitting: Oncology

## 2014-03-08 ENCOUNTER — Other Ambulatory Visit (HOSPITAL_COMMUNITY): Payer: BC Managed Care – PPO

## 2014-03-08 ENCOUNTER — Telehealth: Payer: Self-pay | Admitting: Oncology

## 2014-03-08 VITALS — BP 135/89 | HR 87 | Temp 97.6°F | Resp 18 | Ht 70.0 in | Wt 184.4 lb

## 2014-03-08 DIAGNOSIS — C7A098 Malignant carcinoid tumors of other sites: Secondary | ICD-10-CM

## 2014-03-08 DIAGNOSIS — D3A8 Other benign neuroendocrine tumors: Secondary | ICD-10-CM

## 2014-03-08 DIAGNOSIS — C787 Secondary malignant neoplasm of liver and intrahepatic bile duct: Secondary | ICD-10-CM

## 2014-03-08 NOTE — Progress Notes (Signed)
  [place] OFFICE PROGRESS NOTE   Diagnosis: Pancreatic neuroendocrine tumor  INTERVAL HISTORY:   He returns as scheduled. He continues to have "sweats "approximately 2 nights per week. No diarrhea. Good appetite. No fever. No dominant pain.  He saw Dr. Leamon Arnt on 03/01/2014. Dr. Leamon Arnt is waiting on the [facility] pathology review prior to making treatment recommendations.  Objective:  Vital signs in last 24 hours:  Blood pressure 135/89, pulse 87, temperature 97.6 F (36.4 C), temperature source Oral, resp. rate 18, height 5\' 10"  (1.778 m), weight 184 lb 6.4 oz (83.643 kg), SpO2 98.00%.    HEENT: Neck without mass Resp: Decreased breath sounds at the left lower chest, no respiratory distress Cardio: Regular rate and rhythm, 0-1/7 systolic murmur GI: No hepatomegaly, nontender Vascular: No leg edema   Lab Results:  Lab Results  Component Value Date   WBC 9.3 02/05/2014   HGB 14.3 02/05/2014   HCT 41.0 02/05/2014   MCV 91.3 02/05/2014   PLT 151 02/05/2014   NEUTROABS 5.8 02/04/2014    Medications: I have reviewed the patient's current medications.  Assessment/Plan: 1. Pancreatic neuroendocrine tumor, WHO grade 2, pancreatic head mass and small peripancreatic/celiac nodes on a CT 02/04/2014 EUS revealed evidence of multiple liver metastases-status post an FNA biopsy of a left liver lesion 02/10/2014 confirming a neuroendocrine tumor  2. Chest Seminoma in 1990 treated with BEP and chest radiation at [facility] 3. 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 chronic exertional dyspnea following treatment for the seminoma  5. aortic stenosis on an echocardiogram December 2011  6. lumbar disc surgery 2014  7. acute abdominal pain 02/04/2014-resolved    Disposition:  Mr. Rout appears stable. He appears asymptomatic from the  neuroendocrine tumor unless the night sweats are related. I reviewed the 02/04/2014 abdomen CT images with Mr. Clauson and his wife. The liver metastases are visible on the precontrast images.  We discussed treatment options including observation, treatment with a Sandostatin analog, and chemotherapy. I will contact Dr. Leamon Arnt to get his recommendation. I see no role for a PET scan at present.  We will consider reimaging with a CT at a three-month interval to get a sense for the pace of disease progression. I will consult with radiology regarding the ability to follow the liver lesions by CT.  Betsy Coder, MD  03/08/2014  5:42 PM

## 2014-03-08 NOTE — Telephone Encounter (Signed)
gv pt appt schedule for oct.  °

## 2014-03-09 ENCOUNTER — Other Ambulatory Visit: Payer: Self-pay | Admitting: Oncology

## 2014-03-09 ENCOUNTER — Telehealth: Payer: Self-pay | Admitting: Oncology

## 2014-03-09 ENCOUNTER — Telehealth: Payer: Self-pay | Admitting: *Deleted

## 2014-03-09 DIAGNOSIS — D3A8 Other benign neuroendocrine tumors: Secondary | ICD-10-CM

## 2014-03-09 NOTE — Telephone Encounter (Signed)
Dr. [name] discussed case with Dr. Leamon Arnt: Pathology review revealed intermediate grade tumor. The recommendation is to have Octreoscan and Sandostatin injection. Can schedule office visit if pt wishes to discuss this further. Spoke with pt, he would like to proceed with Octreoscan and Sandostatin. Understands and agrees with plan.

## 2014-03-14 ENCOUNTER — Other Ambulatory Visit: Payer: Self-pay | Admitting: Oncology

## 2014-03-15 ENCOUNTER — Telehealth: Payer: Self-pay | Admitting: *Deleted

## 2014-03-15 NOTE — Telephone Encounter (Signed)
Tentative on schedule for 8/19, 8/20, 8/21. Asking if patient will be OK with this? Need to order the medication ahead of time and is expensive. Marlynn Perking and he checked his schedule and will not be able to have scan that week. Asking for the following week. Called back and left message for [place] with request to change scan to week of 8/28.

## 2014-03-16 ENCOUNTER — Ambulatory Visit (HOSPITAL_BASED_OUTPATIENT_CLINIC_OR_DEPARTMENT_OTHER): Payer: BC Managed Care – PPO

## 2014-03-16 VITALS — BP 137/80 | HR 81 | Temp 97.9°F

## 2014-03-16 DIAGNOSIS — C787 Secondary malignant neoplasm of liver and intrahepatic bile duct: Secondary | ICD-10-CM

## 2014-03-16 DIAGNOSIS — C7A098 Malignant carcinoid tumors of other sites: Secondary | ICD-10-CM

## 2014-03-16 DIAGNOSIS — D3A8 Other benign neuroendocrine tumors: Secondary | ICD-10-CM

## 2014-03-16 DIAGNOSIS — E340 Carcinoid syndrome: Secondary | ICD-10-CM

## 2014-03-16 MED ORDER — OCTREOTIDE ACETATE 30 MG IM KIT
30.0000 mg | PACK | Freq: Once | INTRAMUSCULAR | Status: AC
  Administered 2014-03-16: 30 mg via INTRAMUSCULAR
  Filled 2014-03-16: qty 1

## 2014-03-16 NOTE — Patient Instructions (Signed)

## 2014-03-23 ENCOUNTER — Ambulatory Visit (HOSPITAL_COMMUNITY): Payer: BC Managed Care – PPO

## 2014-03-23 ENCOUNTER — Encounter (HOSPITAL_COMMUNITY): Admission: RE | Admit: 2014-03-23 | Payer: BC Managed Care – PPO | Source: Ambulatory Visit

## 2014-03-24 ENCOUNTER — Encounter (HOSPITAL_COMMUNITY): Payer: BC Managed Care – PPO

## 2014-03-25 ENCOUNTER — Encounter (HOSPITAL_COMMUNITY): Payer: BC Managed Care – PPO

## 2014-03-30 ENCOUNTER — Encounter (HOSPITAL_COMMUNITY)
Admission: RE | Admit: 2014-03-30 | Discharge: 2014-03-30 | Disposition: A | Discharge status: Home / Self Care | Payer: BC Managed Care – PPO | Source: Ambulatory Visit | Attending: Diagnostic Radiology | Admitting: Diagnostic Radiology

## 2014-03-30 DIAGNOSIS — D3A8 Other benign neuroendocrine tumors: Secondary | ICD-10-CM

## 2014-03-30 DIAGNOSIS — C787 Secondary malignant neoplasm of liver and intrahepatic bile duct: Secondary | ICD-10-CM | POA: Diagnosis not present

## 2014-03-30 DIAGNOSIS — D3A098 Benign carcinoid tumors of other sites: Secondary | ICD-10-CM | POA: Diagnosis present

## 2014-03-31 ENCOUNTER — Encounter (HOSPITAL_COMMUNITY)
Admission: RE | Admit: 2014-03-31 | Discharge: 2014-03-31 | Disposition: A | Discharge status: Home / Self Care | Payer: BC Managed Care – PPO | Source: Ambulatory Visit | Attending: Oncology | Admitting: Oncology

## 2014-04-01 ENCOUNTER — Encounter (HOSPITAL_COMMUNITY)
Admission: RE | Admit: 2014-04-01 | Discharge: 2014-04-01 | Disposition: A | Discharge status: Home / Self Care | Payer: BC Managed Care – PPO | Source: Ambulatory Visit | Attending: Oncology | Admitting: Oncology

## 2014-04-01 DIAGNOSIS — C787 Secondary malignant neoplasm of liver and intrahepatic bile duct: Secondary | ICD-10-CM | POA: Diagnosis not present

## 2014-04-01 MED ORDER — INDIUM IN-111 PENTETREOTIDE IV KIT
6.2000 | PACK | Freq: Once | INTRAVENOUS | Status: AC | PRN
  Administered 2014-04-01: 6.2 via INTRAVENOUS

## 2014-04-04 ENCOUNTER — Telehealth: Payer: Self-pay | Admitting: *Deleted

## 2014-04-04 NOTE — Telephone Encounter (Signed)
Per Dr. [name]; notified pt scan show increased activity in liver lesions, not other sites of disease, cont. Sandostatin.  Pt verbalized understanding and confirmed appt for 04/13/14.

## 2014-04-04 NOTE — Telephone Encounter (Signed)
Message copied by Brien Few on Mon Apr 04, 2014  8:50 AM ------      Message from: Ladell Pier      Created: Fri Apr 01, 2014  4:58 PM       Please call patient, scan show increased activity in liver lesions, no other sites of disease, cont. Sandostatin. F/u as scheduled ------

## 2014-04-04 NOTE — Telephone Encounter (Signed)
Message copied by Domenic Schwab on Mon Apr 04, 2014  3:49 PM ------      Message from: Betsy Coder B      Created: Fri Apr 01, 2014  4:58 PM       Please call patient, scan show increased activity in liver lesions, no other sites of disease, cont. Sandostatin. F/u as scheduled ------

## 2014-04-13 ENCOUNTER — Ambulatory Visit (HOSPITAL_BASED_OUTPATIENT_CLINIC_OR_DEPARTMENT_OTHER): Payer: BC Managed Care – PPO

## 2014-04-13 VITALS — BP 128/63 | HR 72 | Temp 98.0°F

## 2014-04-13 DIAGNOSIS — C7A098 Malignant carcinoid tumors of other sites: Secondary | ICD-10-CM

## 2014-04-13 DIAGNOSIS — D3A8 Other benign neuroendocrine tumors: Secondary | ICD-10-CM

## 2014-04-13 DIAGNOSIS — C787 Secondary malignant neoplasm of liver and intrahepatic bile duct: Secondary | ICD-10-CM

## 2014-04-13 DIAGNOSIS — E340 Carcinoid syndrome: Secondary | ICD-10-CM

## 2014-04-13 DIAGNOSIS — Z23 Encounter for immunization: Secondary | ICD-10-CM

## 2014-04-13 MED ORDER — OCTREOTIDE ACETATE 30 MG IM KIT
30.0000 mg | PACK | Freq: Once | INTRAMUSCULAR | Status: AC
  Administered 2014-04-13: 30 mg via INTRAMUSCULAR
  Filled 2014-04-13: qty 1

## 2014-04-13 MED ORDER — INFLUENZA VAC SPLIT QUAD 0.5 ML IM SUSY
0.5000 mL | PREFILLED_SYRINGE | Freq: Once | INTRAMUSCULAR | Status: AC
  Administered 2014-04-13: 0.5 mL via INTRAMUSCULAR
  Filled 2014-04-13: qty 0.5

## 2014-05-11 ENCOUNTER — Ambulatory Visit: Payer: BC Managed Care – PPO

## 2014-05-12 ENCOUNTER — Telehealth: Payer: Self-pay | Admitting: Oncology

## 2014-05-12 ENCOUNTER — Ambulatory Visit (HOSPITAL_BASED_OUTPATIENT_CLINIC_OR_DEPARTMENT_OTHER): Payer: BC Managed Care – PPO

## 2014-05-12 ENCOUNTER — Ambulatory Visit (HOSPITAL_BASED_OUTPATIENT_CLINIC_OR_DEPARTMENT_OTHER): Payer: BC Managed Care – PPO | Admitting: Oncology

## 2014-05-12 VITALS — Wt 188.6 lb

## 2014-05-12 VITALS — BP 136/74 | HR 74 | Temp 98.0°F

## 2014-05-12 DIAGNOSIS — D3A8 Other benign neuroendocrine tumors: Secondary | ICD-10-CM

## 2014-05-12 DIAGNOSIS — C7B8 Other secondary neuroendocrine tumors: Secondary | ICD-10-CM

## 2014-05-12 DIAGNOSIS — C7A8 Other malignant neuroendocrine tumors: Secondary | ICD-10-CM

## 2014-05-12 DIAGNOSIS — E340 Carcinoid syndrome: Secondary | ICD-10-CM

## 2014-05-12 MED ORDER — OCTREOTIDE ACETATE 30 MG IM KIT
30.0000 mg | PACK | Freq: Once | INTRAMUSCULAR | Status: AC
  Administered 2014-05-12: 30 mg via INTRAMUSCULAR
  Filled 2014-05-12: qty 1

## 2014-05-12 NOTE — Progress Notes (Signed)
  [place] OFFICE PROGRESS NOTE   Diagnosis: Metastatic pancreatic neuroendocrine tumor  INTERVAL HISTORY:   Ronald Thompson returns as scheduled. He denies diarrhea and fever. He has night sweats 2-3 times per week. No pain. No nausea or vomiting. He reports fatigue, but he is active working at home and exercising. He is planning a trip to [place]. He began monthly Sandostatin 03/16/2014.  Objective:  Vital signs in last 24 hours:  Weight 188 lb 9.6 oz (85.548 kg).    HEENT: Neck without mass Lymphatics: No cervical or supraclavicular nodes Resp: Decreased breath sounds with end inspiratory coarse rhonchi at the left upper and lower chest, no respiratory distress Cardio: Regular rate and rhythm, 2/6 systolic murmur GI: No hepatomegaly, nontender, no mass Vascular: No leg edema  Medications: I have reviewed the patient's current medications.  Assessment/Plan: 1. Pancreatic neuroendocrine tumor, WHO grade 2, pancreatic head mass and small peripancreatic/celiac nodes on a CT 02/04/2014 EUS revealed evidence of multiple liver metastases-status post an FNA biopsy of a left liver lesion 02/10/2014 confirming a neuroendocrine tumor  Octreotide scan 04/01/2014 with multiple foci of metastatic neuroendocrine tumor in the liver Monthly Sandostatin started 03/16/2014 2. 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 chronic exertional dyspnea following treatment for the seminoma  5. aortic stenosis on an echocardiogram December 2011  6. lumbar disc surgery 2014  7. acute abdominal pain 02/04/2014-resolved    Disposition:  He appears well. He has completed 2 months of Sandostatin therapy. The plan is to continue monthly Sandostatin. He will return for a restaging  CT and office visit in 2 months.   Betsy Coder, MD  05/12/2014  10:13 AM

## 2014-05-12 NOTE — Telephone Encounter (Signed)
, °

## 2014-06-08 ENCOUNTER — Ambulatory Visit (HOSPITAL_BASED_OUTPATIENT_CLINIC_OR_DEPARTMENT_OTHER): Payer: BC Managed Care – PPO

## 2014-06-08 DIAGNOSIS — C7A8 Other malignant neuroendocrine tumors: Secondary | ICD-10-CM

## 2014-06-08 DIAGNOSIS — C7B8 Other secondary neuroendocrine tumors: Secondary | ICD-10-CM

## 2014-06-08 DIAGNOSIS — E340 Carcinoid syndrome: Secondary | ICD-10-CM

## 2014-06-08 DIAGNOSIS — D3A8 Other benign neuroendocrine tumors: Secondary | ICD-10-CM

## 2014-06-08 MED ORDER — OCTREOTIDE ACETATE 30 MG IM KIT
30.0000 mg | PACK | Freq: Once | INTRAMUSCULAR | Status: AC
  Administered 2014-06-08: 30 mg via INTRAMUSCULAR
  Filled 2014-06-08: qty 1

## 2014-06-08 NOTE — Patient Instructions (Signed)

## 2014-07-04 ENCOUNTER — Encounter (HOSPITAL_COMMUNITY): Payer: Self-pay

## 2014-07-04 ENCOUNTER — Other Ambulatory Visit (HOSPITAL_BASED_OUTPATIENT_CLINIC_OR_DEPARTMENT_OTHER): Payer: BC Managed Care – PPO

## 2014-07-04 ENCOUNTER — Ambulatory Visit (HOSPITAL_COMMUNITY)
Admission: RE | Admit: 2014-07-04 | Discharge: 2014-07-04 | Disposition: A | Discharge status: Home / Self Care | Payer: BC Managed Care – PPO | Source: Ambulatory Visit | Attending: Oncology | Admitting: Oncology

## 2014-07-04 DIAGNOSIS — C7B8 Other secondary neuroendocrine tumors: Secondary | ICD-10-CM

## 2014-07-04 DIAGNOSIS — D3A8 Other benign neuroendocrine tumors: Secondary | ICD-10-CM

## 2014-07-04 DIAGNOSIS — K8020 Calculus of gallbladder without cholecystitis without obstruction: Secondary | ICD-10-CM | POA: Diagnosis not present

## 2014-07-04 DIAGNOSIS — C7A8 Other malignant neuroendocrine tumors: Secondary | ICD-10-CM

## 2014-07-04 DIAGNOSIS — C787 Secondary malignant neoplasm of liver and intrahepatic bile duct: Secondary | ICD-10-CM | POA: Insufficient documentation

## 2014-07-04 LAB — COMPREHENSIVE METABOLIC PANEL (CC13)
ALT: 43 U/L (ref 0–55)
AST: 34 U/L (ref 5–34)
Albumin: 4.5 g/dL (ref 3.5–5.0)
Alkaline Phosphatase: 59 U/L (ref 40–150)
Anion Gap: 10 mEq/L (ref 3–11)
BUN: 19.2 mg/dL (ref 7.0–26.0)
CALCIUM: 10 mg/dL (ref 8.4–10.4)
CHLORIDE: 99 meq/L (ref 98–109)
CO2: 31 mEq/L — ABNORMAL HIGH (ref 22–29)
Creatinine: 1 mg/dL (ref 0.7–1.3)
Glucose: 103 mg/dl (ref 70–140)
POTASSIUM: 4.4 meq/L (ref 3.5–5.1)
SODIUM: 140 meq/L (ref 136–145)
TOTAL PROTEIN: 7.2 g/dL (ref 6.4–8.3)
Total Bilirubin: 1.18 mg/dL (ref 0.20–1.20)

## 2014-07-04 MED ORDER — IOHEXOL 300 MG/ML  SOLN: 100.0000 mL | Freq: Once | INTRAMUSCULAR | Status: AC | PRN

## 2014-07-04 MED ORDER — IOHEXOL 300 MG/ML  SOLN
100.0000 mL | Freq: Once | INTRAMUSCULAR | Status: AC | PRN
  Administered 2014-07-04: 100 mL via INTRAVENOUS

## 2014-07-05 ENCOUNTER — Other Ambulatory Visit: Payer: BC Managed Care – PPO

## 2014-07-07 ENCOUNTER — Telehealth: Payer: Self-pay | Admitting: Oncology

## 2014-07-07 ENCOUNTER — Ambulatory Visit (HOSPITAL_BASED_OUTPATIENT_CLINIC_OR_DEPARTMENT_OTHER): Payer: BC Managed Care – PPO

## 2014-07-07 ENCOUNTER — Ambulatory Visit (HOSPITAL_BASED_OUTPATIENT_CLINIC_OR_DEPARTMENT_OTHER): Payer: BC Managed Care – PPO | Admitting: Oncology

## 2014-07-07 VITALS — BP 148/87 | HR 85 | Temp 97.7°F | Resp 20 | Ht 70.0 in | Wt 186.8 lb

## 2014-07-07 DIAGNOSIS — D3A8 Other benign neuroendocrine tumors: Secondary | ICD-10-CM

## 2014-07-07 DIAGNOSIS — C7B8 Other secondary neuroendocrine tumors: Secondary | ICD-10-CM

## 2014-07-07 DIAGNOSIS — C7A8 Other malignant neuroendocrine tumors: Secondary | ICD-10-CM

## 2014-07-07 LAB — CHROMOGRANIN A

## 2014-07-07 MED ORDER — OCTREOTIDE ACETATE 30 MG IM KIT
30.0000 mg | PACK | Freq: Once | INTRAMUSCULAR | Status: AC
  Administered 2014-07-07: 30 mg via INTRAMUSCULAR
  Filled 2014-07-07: qty 1

## 2014-07-07 NOTE — Telephone Encounter (Signed)
Gave avs & cal for Jan Thru March 2016.

## 2014-07-07 NOTE — Patient Instructions (Signed)

## 2014-07-07 NOTE — Progress Notes (Signed)
[place] OFFICE PROGRESS NOTE   Diagnosis: Pancreatic neuroendocrine tumor  INTERVAL HISTORY:   Ronald Thompson returns as scheduled. He feels well. He recently returned from a trip to [place]. No flushing or diarrhea. He has 3-4 formed bowel movements per day. He has noted episodes of "weakness "when he feels hungry. This is relieved by eating. He is exercising. No apparent side effects from the Sandostatin injection.  Objective:  Vital signs in last 24 hours:  Blood pressure 148/87, pulse 85, temperature 97.7 F (36.5 C), temperature source Oral, resp. rate 20, height 5\' 10"  (1.778 m), weight 186 lb 12.8 oz (84.732 kg).    HEENT: Neck without mass Lymphatics: No cervical or supra-clavicular nodes Resp: Decreased breath sounds at the left chest, no respiratory distress Cardio: Regular rate and rhythm, 2/6 systolic murmur GI: The liver edge is palpable at the right lateral costal margin. Nontender. No apparent ascites. No splenomegaly. Vascular: No leg edema.   Lab Results:   Chromogranin A pending from 07/04/2014   Imaging:  Ct Abdomen Pelvis W Contrast  07/04/2014   CLINICAL DATA:  Followup metastatic neuroendocrine carcinoma of the pancreas.  EXAM: CT ABDOMEN AND PELVIS WITH CONTRAST  TECHNIQUE: Multidetector CT imaging of the abdomen and pelvis was performed using the standard protocol following bolus administration of intravenous contrast.  CONTRAST:  130mL OMNIPAQUE IOHEXOL 300 MG/ML  SOLN  COMPARISON:  CTA on 02/04/2014  FINDINGS: Lower Chest:  Elevation of left hemidiaphragm again noted.  Hepatobiliary: Multiple liver metastases are seen throughout both right and left hepatic lobes. Comparison with previous CTA is difficult due to significant differences in contrast bolus timing between the exams. Some of these liver metastases were present, although they do appear more numerous on the current exam. Is uncertain within this is due to actual increase in disease or  differences in contrast bolus.  Cholelithiasis again demonstrated, without evidence of cholecystitis or biliary dilatation.  Pancreas: Previously seen low-attenuation cystic lesion in the pancreatic head is no longer visualized. Tiny less than 1 cm peripancreatic lymph nodes remain stable, largest measuring 7 mm on image 48 of series 4.  Spleen:  Within normal limits in size and appearance.  Adrenal Glands:  No mass identified.  Kidneys/Urinary Tract: No masses identified. Stable benign-appearing right renal cyst again noted. No evidence of hydronephrosis.  Stomach/Bowel/Peritoneum: No evidence of wall thickening, mass, or obstruction.  Vascular/Lymphatic: No pathologically enlarged lymph nodes identified. No other significant abnormality identified.  Reproductive:  No mass or other significant abnormality identified.  Other:  None.  Musculoskeletal:  No suspicious bone lesions identified.  IMPRESSION: Resolution of previously seen low-attenuation lesion in the pancreatic head since prior study.  Stable tiny less than 1 cm peripancreatic lymph nodes.  Diffuse liver metastases. While these appear more numerous than on previous study, it is uncertain within this is due to actual increase in disease versus significant differences in contrast bolus timing between these exams.  Cholelithiasis.  No radiographic evidence of cholecystitis.   Electronically Signed   By: Earle Gell M.D.   On: 07/04/2014 12:43    Medications: I have reviewed the patient's current medications.  Assessment/Plan: 1. Pancreatic neuroendocrine tumor, WHO grade 2, pancreatic head mass and small peripancreatic/celiac nodes on a CT 02/04/2014  EUS revealed evidence of multiple liver metastases-status post an FNA biopsy of a left liver lesion 02/10/2014 confirming a neuroendocrine tumor   Octreotide scan 04/01/2014 with multiple foci of metastatic neuroendocrine tumor in the liver  Monthly Sandostatin  started 03/16/2014  Restaging CT  07/04/2014 with resolution of a previously noted pancreas head lesion and probable progression of liver metastases 2. 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 chronic exertional dyspnea following treatment for the seminoma  5. aortic stenosis on an echocardiogram December 2011  6. lumbar disc surgery 2014  7. acute abdominal pain 02/04/2014-resolved     Disposition:  Ronald Thompson appears stable. He is tolerating the Sandostatin well. The restaging CT suggest progression of liver metastases, but this may be a reflection of the timing of contrast menstruation. I reviewed the CT images with Ronald Thompson and his wife. I will present his case at the GI tumor conference. The plan is to continue monthly Sandostatin if there is not significant progression on the conference review of the CTs. We discussed sunitinib and radioactive MIBG as potential systemic treatment options.  He will continue monthly Sandostatin. We will plan a restaging CT in 4 months. Betsy Coder, MD  07/07/2014  10:56 AM

## 2014-07-08 ENCOUNTER — Telehealth: Payer: Self-pay | Admitting: *Deleted

## 2014-07-08 NOTE — Telephone Encounter (Signed)
-----   Message from Ladell Pier, MD sent at 07/08/2014  8:05 AM EST ----- Please call patient, Chromagranin A is better, continue sandostatin, f/u as scheduled, I will present his case at conference 12/16 No conference 12/9

## 2014-07-08 NOTE — Telephone Encounter (Signed)
Pt returned call, lab results given.

## 2014-08-11 ENCOUNTER — Ambulatory Visit (HOSPITAL_BASED_OUTPATIENT_CLINIC_OR_DEPARTMENT_OTHER): Payer: BLUE CROSS/BLUE SHIELD

## 2014-08-11 DIAGNOSIS — C7A8 Other malignant neuroendocrine tumors: Secondary | ICD-10-CM

## 2014-08-11 DIAGNOSIS — D3A8 Other benign neuroendocrine tumors: Secondary | ICD-10-CM

## 2014-08-11 DIAGNOSIS — C7B8 Other secondary neuroendocrine tumors: Secondary | ICD-10-CM

## 2014-08-11 MED ORDER — OCTREOTIDE ACETATE 30 MG IM KIT
30.0000 mg | PACK | Freq: Once | INTRAMUSCULAR | Status: AC
  Administered 2014-08-11: 30 mg via INTRAMUSCULAR
  Filled 2014-08-11: qty 1

## 2014-08-11 NOTE — Patient Instructions (Signed)

## 2014-09-08 ENCOUNTER — Ambulatory Visit (HOSPITAL_BASED_OUTPATIENT_CLINIC_OR_DEPARTMENT_OTHER): Payer: BLUE CROSS/BLUE SHIELD

## 2014-09-08 DIAGNOSIS — C7A8 Other malignant neuroendocrine tumors: Secondary | ICD-10-CM

## 2014-09-08 DIAGNOSIS — C7B8 Other secondary neuroendocrine tumors: Secondary | ICD-10-CM

## 2014-09-08 DIAGNOSIS — D3A8 Other benign neuroendocrine tumors: Secondary | ICD-10-CM

## 2014-09-08 MED ORDER — OCTREOTIDE ACETATE 30 MG IM KIT
30.0000 mg | PACK | Freq: Once | INTRAMUSCULAR | Status: AC
  Administered 2014-09-08: 30 mg via INTRAMUSCULAR
  Filled 2014-09-08: qty 1

## 2014-09-27 ENCOUNTER — Encounter: Payer: Self-pay | Admitting: Internal Medicine

## 2014-09-27 ENCOUNTER — Ambulatory Visit (INDEPENDENT_AMBULATORY_CARE_PROVIDER_SITE_OTHER): Payer: BLUE CROSS/BLUE SHIELD | Admitting: Internal Medicine

## 2014-09-27 VITALS — BP 140/80 | HR 84 | Temp 97.5°F | Resp 18 | Ht 70.0 in | Wt 187.1 lb

## 2014-09-27 DIAGNOSIS — M7020 Olecranon bursitis, unspecified elbow: Secondary | ICD-10-CM | POA: Insufficient documentation

## 2014-09-27 DIAGNOSIS — G250 Essential tremor: Secondary | ICD-10-CM | POA: Insufficient documentation

## 2014-09-27 DIAGNOSIS — M7022 Olecranon bursitis, left elbow: Secondary | ICD-10-CM

## 2014-09-27 NOTE — Assessment & Plan Note (Signed)
Very mild, d/w pt educated, reassured, no specific tx needed at this time, consider BB for worsening

## 2014-09-27 NOTE — Progress Notes (Signed)
Pre visit review using our clinic review tool, if applicable. No additional management support is needed unless otherwise documented below in the visit note. 

## 2014-09-27 NOTE — Patient Instructions (Signed)
You have a left olecranon bursitis, which on exam today does not require specific treatment, and will heal over the next few months  You also has a very mild right hand essential tremor, which does not need treatment now, but you could consider a Beta Blocker in the future.  Please continue all other medications as before, and refills have been done if requested.  Please have the pharmacy call with any other refills you may need..  Please keep your appointments with your specialists as you may have planned

## 2014-09-27 NOTE — Progress Notes (Signed)
Subjective:    Patient ID: Ronald Thompson, male    DOB: 06-Jul-1956, 59 y.o.   MRN: [id number]  HPI  Here to f/u after accidentally ran the tip of left elbow into a brick wall with brisk walking 1 mo ago, had immediate pain and mild swelling, but most resolved and no elbow dysfunction, except for onset and no improvement since of a mod large egg shaped mass at the site of impact, No fever, skin redness, has been mild tender to touch only, o/w no pain and has no difficulty using arm or shoulder.   Also mentions some intermittent few minor shaking of right hand noticed by wife recent with picking up a coffee cup, but no lack of strength or pain.  Has family hx of another with similar issue,.  Does not really bother him, only noticed by wife, he does not necessariuly want tx.  Tremor controlled and negated by using the hand for other thing. Past Medical History  Diagnosis Date  . 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 Baker's cyst   . Hypertension   . Heart murmur   . Transfusion history     hx. 25 yrs ago-during cancer tx.  . 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 Lesion of left lung     hx.25 yrs ago- testicilar cancer related- only scarring left after tx.-no problems now  . Hypercholesteremia    Past Surgical History  Procedure Laterality Date  . Thoracotomy  1990    wedge biopsy of mediastinal mass  . Back surgery      '14- rupt. disc   . Knee surgery      age 28 for Baker's cyst  . Eus N/A 02/10/2014    Procedure: UPPER ENDOSCOPIC ULTRASOUND (EUS) LINEAR;  Surgeon: [name], MD;  Location: [facility];  Service: Endoscopy;  Laterality: N/A;    reports that he has never smoked. He has never used smokeless tobacco. He reports that he drinks alcohol. He reports that he does not use illicit drugs. family history includes Cancer in his maternal aunt and paternal grandfather; Dementia in his mother; Emphysema in his  maternal aunt. Allergies  Allergen Reactions  . Penicillins Hives    Entire body.   Current Outpatient Prescriptions on File Prior to Visit  Medication Sig Dispense Refill  . acetaminophen-codeine (TYLENOL #3) 300-30 MG per tablet Take 1 tablet by mouth as needed.    . calcium carbonate (TUMS - DOSED IN MG ELEMENTAL CALCIUM) 500 MG chewable tablet Chew 3 tablets by mouth as needed for indigestion or heartburn.    . Coenzyme Q10 (COQ10) 100 MG CAPS Take 1 capsule by mouth daily.    . Cyanocobalamin (VITAMIN B 12 PO) Take 1 tablet by mouth daily.    Marland Kitchen docusate sodium (COLACE) 100 MG capsule Take 100 mg by mouth 2 (two) times daily.    Marland Kitchen ibuprofen (ADVIL,MOTRIN) 200 MG tablet Take 600 mg by mouth every 6 (six) hours as needed for mild pain or moderate pain.     Marland Kitchen losartan (COZAAR) 50 MG tablet Take 1 tablet (50 mg total) by mouth every morning. 90 tablet 3  . ondansetron (ZOFRAN) 4 MG tablet Take 4 mg by mouth as needed.    Marland Kitchen oxyCODONE (OXY IR/ROXICODONE) 5 MG immediate release tablet Take 5 mg by mouth as needed.    . simvastatin (ZOCOR) 20 MG tablet Take 20 mg  by mouth every evening.     No current facility-administered medications on file prior to visit.   Review of Systems All otherwise neg per pt     Objective:   Physical Exam BP 140/80 mmHg  Pulse 84  Temp(Src) 97.5 F (36.4 C) (Oral)  Resp 18  Ht 5\' 10"  (1.778 m)  Wt 187 lb 1.3 oz (84.859 kg)  BMI 26.84 kg/m2  SpO2 95% VS noted,  Constitutional: Pt appears well-developed, well-nourished.  HENT: Head: NCAT.  Right Ear: External ear normal.  Left Ear: External ear normal.  Eyes: . Pupils are equal, round, and reactive to light. Conjunctivae and EOM are normal Neck: Normal range of motion. Neck supple.  Cardiovascular: Normal rate and regular rhythm.   Pulmonary/Chest: Effort normal and breath sounds without rales or wheezing.  Abd:  Soft, NT, ND, + BS Neurological: Pt is alert. Not confused , cn 2-12 intact, motor 5/5  intact, small tremor noted intermittent right hand Left elbow with minor tender olecranon fluid filled mass, no erythema or drainage, no red streaks, elbow with FROM, o/w no pain with use Skin: Skin is warm. No rash Psychiatric: Pt behavior is normal. No agitation.      Assessment & Plan:

## 2014-09-27 NOTE — Assessment & Plan Note (Signed)
Mild, not infected, essenatily minimally symptomatic, pt educated, reassure, no specific tx required at this time

## 2014-09-27 NOTE — Assessment & Plan Note (Signed)
stable overall by history and exam, recent data reviewed with pt, and pt to continue medical treatment as before,  to f/u any worsening symptoms or concerns BP Readings from Last 3 Encounters:  09/27/14 140/80  09/08/14 119/58  08/11/14 128/67

## 2014-10-06 ENCOUNTER — Ambulatory Visit (HOSPITAL_BASED_OUTPATIENT_CLINIC_OR_DEPARTMENT_OTHER): Payer: BLUE CROSS/BLUE SHIELD

## 2014-10-06 DIAGNOSIS — C7B8 Other secondary neuroendocrine tumors: Secondary | ICD-10-CM

## 2014-10-06 DIAGNOSIS — D3A8 Other benign neuroendocrine tumors: Secondary | ICD-10-CM

## 2014-10-06 DIAGNOSIS — C7A8 Other malignant neuroendocrine tumors: Secondary | ICD-10-CM

## 2014-10-06 MED ORDER — OCTREOTIDE ACETATE 30 MG IM KIT
30.0000 mg | PACK | Freq: Once | INTRAMUSCULAR | Status: AC
  Administered 2014-10-06: 30 mg via INTRAMUSCULAR
  Filled 2014-10-06: qty 1

## 2014-10-06 NOTE — Patient Instructions (Signed)

## 2014-10-27 ENCOUNTER — Telehealth: Payer: Self-pay | Admitting: *Deleted

## 2014-10-27 NOTE — Telephone Encounter (Signed)
Patient called reporting he missed a call from [phone] about appointments.  Gave the 10-31-2014 information and instructed to have nothing but liquids 4 hours before scans and pick up contrast from [facility] by Friday at 4:30 pm.

## 2014-10-31 ENCOUNTER — Encounter (HOSPITAL_COMMUNITY): Payer: Self-pay

## 2014-10-31 ENCOUNTER — Other Ambulatory Visit (HOSPITAL_BASED_OUTPATIENT_CLINIC_OR_DEPARTMENT_OTHER): Payer: BLUE CROSS/BLUE SHIELD

## 2014-10-31 ENCOUNTER — Ambulatory Visit (HOSPITAL_COMMUNITY)
Admission: RE | Admit: 2014-10-31 | Discharge: 2014-10-31 | Disposition: A | Discharge status: Home / Self Care | Payer: BLUE CROSS/BLUE SHIELD | Source: Ambulatory Visit | Attending: Oncology | Admitting: Oncology

## 2014-10-31 DIAGNOSIS — D3A8 Other benign neuroendocrine tumors: Secondary | ICD-10-CM

## 2014-10-31 DIAGNOSIS — C7A8 Other malignant neuroendocrine tumors: Secondary | ICD-10-CM | POA: Diagnosis not present

## 2014-10-31 DIAGNOSIS — C7B8 Other secondary neuroendocrine tumors: Secondary | ICD-10-CM

## 2014-10-31 LAB — COMPREHENSIVE METABOLIC PANEL (CC13)
ALT: 38 U/L (ref 0–55)
ANION GAP: 10 meq/L (ref 3–11)
AST: 30 U/L (ref 5–34)
Albumin: 4.1 g/dL (ref 3.5–5.0)
Alkaline Phosphatase: 64 U/L (ref 40–150)
BUN: 21.8 mg/dL (ref 7.0–26.0)
CO2: 31 mEq/L — ABNORMAL HIGH (ref 22–29)
CREATININE: 1 mg/dL (ref 0.7–1.3)
Calcium: 9.5 mg/dL (ref 8.4–10.4)
Chloride: 99 mEq/L (ref 98–109)
EGFR: 78 mL/min/{1.73_m2} — ABNORMAL LOW (ref >90)
Glucose: 116 mg/dl (ref 70–140)
POTASSIUM: 4.4 meq/L (ref 3.5–5.1)
Sodium: 140 mEq/L (ref 136–145)
TOTAL PROTEIN: 7 g/dL (ref 6.4–8.3)
Total Bilirubin: 0.72 mg/dL (ref 0.20–1.20)

## 2014-10-31 MED ORDER — IOHEXOL 300 MG/ML  SOLN
100.0000 mL | Freq: Once | INTRAMUSCULAR | Status: AC | PRN
  Administered 2014-10-31: 100 mL via INTRAVENOUS

## 2014-11-03 ENCOUNTER — Ambulatory Visit (HOSPITAL_BASED_OUTPATIENT_CLINIC_OR_DEPARTMENT_OTHER): Payer: BLUE CROSS/BLUE SHIELD

## 2014-11-03 ENCOUNTER — Ambulatory Visit (HOSPITAL_BASED_OUTPATIENT_CLINIC_OR_DEPARTMENT_OTHER): Payer: BLUE CROSS/BLUE SHIELD | Admitting: Oncology

## 2014-11-03 ENCOUNTER — Telehealth: Payer: Self-pay | Admitting: Oncology

## 2014-11-03 VITALS — BP 130/71 | HR 70 | Temp 97.9°F | Resp 18 | Ht 70.0 in | Wt 185.9 lb

## 2014-11-03 DIAGNOSIS — C7B8 Other secondary neuroendocrine tumors: Secondary | ICD-10-CM

## 2014-11-03 DIAGNOSIS — C7A8 Other malignant neuroendocrine tumors: Secondary | ICD-10-CM

## 2014-11-03 DIAGNOSIS — R0609 Other forms of dyspnea: Secondary | ICD-10-CM | POA: Diagnosis not present

## 2014-11-03 DIAGNOSIS — D3A8 Other benign neuroendocrine tumors: Secondary | ICD-10-CM

## 2014-11-03 MED ORDER — OCTREOTIDE ACETATE 30 MG IM KIT
30.0000 mg | PACK | Freq: Once | INTRAMUSCULAR | Status: AC
  Administered 2014-11-03: 30 mg via INTRAMUSCULAR
  Filled 2014-11-03: qty 1

## 2014-11-03 NOTE — Progress Notes (Signed)
[place] OFFICE PROGRESS NOTE   Diagnosis: Pancreas neuroendocrine tumor  INTERVAL HISTORY:   Ronald Thompson returns as scheduled. Good appetite and energy level. He has noted a progressive increase in exertional dyspnea. No diarrhea or flushing. He occasionally feels weak and dizzy, eating helps. He continues monthly Sandostatin.  Objective:  Vital signs in last 24 hours:  Blood pressure 130/71, pulse 70, temperature 97.9 F (36.6 C), temperature source Oral, resp. rate 18, height 5\' 10"  (1.778 m), weight 185 lb 14.4 oz (84.324 kg), SpO2 97 %.    HEENT: Neck without mass Resp: Decreased breath sounds at the left compared to the right chest, no respiratory distress Cardio: Regular rate and rhythm GI: No hepatomegaly, nontender, no mass Vascular: No leg edema, venous engorgement at the left upper chest   Imaging:  Ct Abdomen Pelvis W Contrast  10/31/2014   CLINICAL DATA:  Pancreatic neuroendocrine carcinoma. Subsequent encounter.  EXAM: CT ABDOMEN AND PELVIS WITH CONTRAST  TECHNIQUE: Multidetector CT imaging of the abdomen and pelvis was performed using the standard protocol following bolus administration of intravenous contrast.  CONTRAST:  192mL OMNIPAQUE IOHEXOL 300 MG/ML  SOLN  COMPARISON:  07/04/2014 and 02/04/2014.  FINDINGS: Lower chest: Lung bases show no acute findings. Left hemidiaphragm is elevated. No pericardial or pleural effusion.  Hepatobiliary: Numerous intermediate attenuation lesions in the liver appear grossly stable and are best seen on nephrographic phase imaging. Index lesion in the inferior right hepatic lobe measures 2.8 cm (series 8, image 21). Gallstone measures 2.6 cm. No biliary ductal dilatation.  Pancreas: Unremarkable. Cystic lesion in the pancreatic head/uncinate process seen on 02/04/2014 is not visualized. No ductal dilatation.  Spleen: Negative.  Adrenals/Urinary Tract: Adrenal glands are unremarkable. Low-attenuation lesions in the kidneys  measure up to 3.5 cm on the right, as before. Statistically, cysts are most likely. Ureters are decompressed. Bladder is unremarkable.  Stomach/Bowel: Stomach, small bowel, appendix and colon are unremarkable.  Vascular/Lymphatic: Atherosclerotic calcification of the arterial vasculature without abdominal aortic aneurysm. No pathologically enlarged lymph nodes. Peripancreatic lymph node measures 7 mm in short axis (series 4, image 29), stable.  Reproductive: Prostate is at the upper limits of normal in size.  Other: No free fluid.  Mesenteries and peritoneum are unremarkable.  Musculoskeletal: No worrisome lytic or sclerotic lesions. A small peripherally sclerotic lesion in the left iliac wing (series 4, image 62) is unchanged.  IMPRESSION: 1. Hepatic metastases appear grossly stable. 2. No evidence of recurrence within the pancreas. Peripancreatic lymph node is stable. 3. Cholelithiasis.   Electronically Signed   By: Lorin Picket M.D.   On: 10/31/2014 09:59   Images reviewed with RonaldLuckman and his wife   Medications: I have reviewed the patient's current medications.  Assessment/Plan: 1. Pancreatic neuroendocrine tumor, WHO grade 2, pancreatic head mass and small peripancreatic/celiac nodes on a CT 02/04/2014  EUS revealed evidence of multiple liver metastases-status post an FNA biopsy of a left liver lesion 02/10/2014 confirming a neuroendocrine tumor   Octreotide scan 04/01/2014 with multiple foci of metastatic neuroendocrine tumor in the liver  Monthly Sandostatin started 03/16/2014  Restaging CT 07/04/2014 with resolution of a previously noted pancreas head lesion and probable progression of liver metastases  Restaging CT 10/31/2014-stable liver lesions, no evidence of a pancreas mass, stable.    Lymph node  2. 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 chronic left chest wall/arm venous engorgement-presumably related to chronic occlusion of the left  subclavian/brachiocephalic vein (he  reports being diagnosed with a left chest DVT in 1990)  4. chronic exertional dyspnea following treatment for the seminoma  5. aortic stenosis on an echocardiogram December 2011  6. lumbar disc surgery 2014  7. acute abdominal pain 02/04/2014-resolved     Disposition:  His overall status appears unchanged. The restaging CT reveals no significant change in the liver metastases. He will continue monthly Sandostatin. We will consider Sutent or a referral for  MIBG if there is clinical or CT evidence of disease progression. He will be scheduled for a restaging CT in 4 months.  I recommended he discuss the exertional dyspnea with Dr. Jenny Reichmann. This is most likely related to chronic scarring in the left chest, but could have a cardiac issue.   He will return for an office visit in 4 months.  Betsy Coder, MD  11/03/2014  8:42 AM

## 2014-11-03 NOTE — Telephone Encounter (Signed)
Pt confirmed labs/ov/inj per 03/31 POF, gave pt AVS and Calendar.... KJ

## 2014-11-03 NOTE — Telephone Encounter (Signed)
Fixed labs on 07/26 and s/w pt confirming to p/u contrast at next injection visit..... KJ

## 2014-11-04 LAB — CHROMOGRANIN A: CHROMOGRANIN A: 7 ng/mL (ref <=15)

## 2014-12-02 ENCOUNTER — Ambulatory Visit (HOSPITAL_BASED_OUTPATIENT_CLINIC_OR_DEPARTMENT_OTHER): Payer: BLUE CROSS/BLUE SHIELD

## 2014-12-02 VITALS — BP 122/70 | HR 74 | Temp 98.0°F

## 2014-12-02 DIAGNOSIS — C7A8 Other malignant neuroendocrine tumors: Secondary | ICD-10-CM | POA: Diagnosis not present

## 2014-12-02 DIAGNOSIS — C7B8 Other secondary neuroendocrine tumors: Secondary | ICD-10-CM | POA: Diagnosis not present

## 2014-12-02 DIAGNOSIS — D3A8 Other benign neuroendocrine tumors: Secondary | ICD-10-CM

## 2014-12-02 MED ORDER — OCTREOTIDE ACETATE 30 MG IM KIT
30.0000 mg | PACK | Freq: Once | INTRAMUSCULAR | Status: AC
  Administered 2014-12-02: 30 mg via INTRAMUSCULAR
  Filled 2014-12-02: qty 1

## 2014-12-22 ENCOUNTER — Telehealth: Payer: Self-pay | Admitting: Oncology

## 2014-12-22 NOTE — Telephone Encounter (Signed)
Patient called to r/s 5/27 inj to 5/26 due to out of town. Patient has new d/t.

## 2014-12-29 ENCOUNTER — Ambulatory Visit (HOSPITAL_BASED_OUTPATIENT_CLINIC_OR_DEPARTMENT_OTHER): Payer: BLUE CROSS/BLUE SHIELD

## 2014-12-29 ENCOUNTER — Telehealth: Payer: Self-pay | Admitting: Oncology

## 2014-12-29 VITALS — BP 126/73 | HR 66 | Temp 97.9°F

## 2014-12-29 DIAGNOSIS — C7B8 Other secondary neuroendocrine tumors: Secondary | ICD-10-CM

## 2014-12-29 DIAGNOSIS — C7A8 Other malignant neuroendocrine tumors: Secondary | ICD-10-CM

## 2014-12-29 DIAGNOSIS — D3A8 Other benign neuroendocrine tumors: Secondary | ICD-10-CM

## 2014-12-29 MED ORDER — OCTREOTIDE ACETATE 30 MG IM KIT
30.0000 mg | PACK | Freq: Once | INTRAMUSCULAR | Status: AC
  Administered 2014-12-29: 30 mg via INTRAMUSCULAR
  Filled 2014-12-29: qty 1

## 2014-12-29 NOTE — Telephone Encounter (Signed)
Due to CME 7/29 fu/inj moved to 7/28. Spoke with patient he is aware. Other appointments remain the same and patient will get new schedule 6/24.

## 2014-12-30 ENCOUNTER — Ambulatory Visit: Payer: BLUE CROSS/BLUE SHIELD

## 2015-01-23 ENCOUNTER — Telehealth: Payer: Self-pay | Admitting: Oncology

## 2015-01-23 NOTE — Telephone Encounter (Signed)
Patient called and moved 01/27/15 injection appointment to 01/30/15 due to he is out of town. Injections are monthly per patient. The following injection on 03/02/15 is ok to remain as scheduled.

## 2015-01-27 ENCOUNTER — Ambulatory Visit: Payer: BLUE CROSS/BLUE SHIELD

## 2015-01-30 ENCOUNTER — Other Ambulatory Visit: Payer: Self-pay | Admitting: *Deleted

## 2015-01-30 ENCOUNTER — Ambulatory Visit (HOSPITAL_BASED_OUTPATIENT_CLINIC_OR_DEPARTMENT_OTHER): Payer: BLUE CROSS/BLUE SHIELD

## 2015-01-30 VITALS — BP 136/71 | HR 67 | Temp 97.9°F

## 2015-01-30 DIAGNOSIS — C7A8 Other malignant neuroendocrine tumors: Secondary | ICD-10-CM

## 2015-01-30 DIAGNOSIS — C7B8 Other secondary neuroendocrine tumors: Secondary | ICD-10-CM | POA: Diagnosis not present

## 2015-01-30 DIAGNOSIS — D3A8 Other benign neuroendocrine tumors: Secondary | ICD-10-CM

## 2015-01-30 MED ORDER — OCTREOTIDE ACETATE 30 MG IM KIT
30.0000 mg | PACK | Freq: Once | INTRAMUSCULAR | Status: AC
  Administered 2015-01-30: 30 mg via INTRAMUSCULAR
  Filled 2015-01-30: qty 1

## 2015-01-30 NOTE — Patient Instructions (Signed)

## 2015-02-03 ENCOUNTER — Other Ambulatory Visit (INDEPENDENT_AMBULATORY_CARE_PROVIDER_SITE_OTHER): Payer: BLUE CROSS/BLUE SHIELD

## 2015-02-03 DIAGNOSIS — Z0000 Encounter for general adult medical examination without abnormal findings: Secondary | ICD-10-CM | POA: Diagnosis not present

## 2015-02-03 LAB — HEPATIC FUNCTION PANEL
ALBUMIN: 4.2 g/dL (ref 3.5–5.2)
ALT: 28 U/L (ref 0–53)
AST: 24 U/L (ref 0–37)
Alkaline Phosphatase: 55 U/L (ref 39–117)
BILIRUBIN DIRECT: 0.2 mg/dL (ref 0.0–0.3)
TOTAL PROTEIN: 6.5 g/dL (ref 6.0–8.3)
Total Bilirubin: 1.1 mg/dL (ref 0.2–1.2)

## 2015-02-03 LAB — CBC WITH DIFFERENTIAL/PLATELET
Basophils Absolute: 0 10*3/uL (ref 0.0–0.1)
Basophils Relative: 0.6 % (ref 0.0–3.0)
EOS ABS: 0.1 10*3/uL (ref 0.0–0.7)
Eosinophils Relative: 1.2 % (ref 0.0–5.0)
HEMATOCRIT: 46.5 % (ref 39.0–52.0)
Hemoglobin: 15.8 g/dL (ref 13.0–17.0)
Lymphocytes Relative: 14 % (ref 12.0–46.0)
Lymphs Abs: 0.7 10*3/uL (ref 0.7–4.0)
MCHC: 33.9 g/dL (ref 30.0–36.0)
MCV: 92.5 fl (ref 78.0–100.0)
Monocytes Absolute: 0.6 10*3/uL (ref 0.1–1.0)
Monocytes Relative: 11.1 % (ref 3.0–12.0)
NEUTROS PCT: 73.1 % (ref 43.0–77.0)
Neutro Abs: 3.8 10*3/uL (ref 1.4–7.7)
PLATELETS: 228 10*3/uL (ref 150.0–400.0)
RBC: 5.03 Mil/uL (ref 4.22–5.81)
RDW: 13.1 % (ref 11.5–15.5)
WBC: 5.1 10*3/uL (ref 4.0–10.5)

## 2015-02-03 LAB — BASIC METABOLIC PANEL
BUN: 19 mg/dL (ref 6–23)
CHLORIDE: 101 meq/L (ref 96–112)
CO2: 30 meq/L (ref 19–32)
Calcium: 9.3 mg/dL (ref 8.4–10.5)
Creatinine, Ser: 1.05 mg/dL (ref 0.40–1.50)
GFR: 76.25 mL/min (ref >60.00)
GLUCOSE: 100 mg/dL — AB (ref 70–99)
POTASSIUM: 4.3 meq/L (ref 3.5–5.1)
SODIUM: 139 meq/L (ref 135–145)

## 2015-02-03 LAB — URINALYSIS, ROUTINE W REFLEX MICROSCOPIC
BILIRUBIN URINE: NEGATIVE
Hgb urine dipstick: NEGATIVE
Ketones, ur: NEGATIVE
Leukocytes, UA: NEGATIVE
Nitrite: NEGATIVE
RBC / HPF: NONE SEEN (ref 0–?)
Specific Gravity, Urine: 1.01 (ref 1.000–1.030)
Total Protein, Urine: NEGATIVE
URINE GLUCOSE: NEGATIVE
Urobilinogen, UA: 1 (ref 0.0–1.0)
pH: 7 (ref 5.0–8.0)

## 2015-02-03 LAB — LIPID PANEL
CHOL/HDL RATIO: 4
Cholesterol: 191 mg/dL (ref 0–200)
HDL: 47.4 mg/dL (ref >39.00)
LDL Cholesterol: 129 mg/dL — ABNORMAL HIGH (ref 0–99)
NonHDL: 143.6
TRIGLYCERIDES: 74 mg/dL (ref 0.0–149.0)
VLDL: 14.8 mg/dL (ref 0.0–40.0)

## 2015-02-03 LAB — TSH: TSH: 1.31 u[IU]/mL (ref 0.35–4.50)

## 2015-02-03 LAB — PSA: PSA: 1.15 ng/mL (ref 0.10–4.00)

## 2015-02-07 ENCOUNTER — Encounter (INDEPENDENT_AMBULATORY_CARE_PROVIDER_SITE_OTHER): Payer: Self-pay

## 2015-02-07 ENCOUNTER — Encounter: Payer: Self-pay | Admitting: Internal Medicine

## 2015-02-07 ENCOUNTER — Ambulatory Visit (INDEPENDENT_AMBULATORY_CARE_PROVIDER_SITE_OTHER): Payer: BLUE CROSS/BLUE SHIELD | Admitting: Internal Medicine

## 2015-02-07 VITALS — BP 128/76 | HR 84 | Temp 98.3°F | Ht 70.0 in | Wt 184.0 lb

## 2015-02-07 DIAGNOSIS — E785 Hyperlipidemia, unspecified: Secondary | ICD-10-CM | POA: Diagnosis not present

## 2015-02-07 DIAGNOSIS — R5383 Other fatigue: Secondary | ICD-10-CM

## 2015-02-07 DIAGNOSIS — Z0000 Encounter for general adult medical examination without abnormal findings: Secondary | ICD-10-CM | POA: Diagnosis not present

## 2015-02-07 DIAGNOSIS — N529 Male erectile dysfunction, unspecified: Secondary | ICD-10-CM | POA: Insufficient documentation

## 2015-02-07 MED ORDER — TADALAFIL 20 MG PO TABS: 20.0000 mg | ORAL_TABLET | Freq: Every day | ORAL | Status: DC | PRN

## 2015-02-07 NOTE — Assessment & Plan Note (Signed)

## 2015-02-07 NOTE — Progress Notes (Signed)
Pre visit review using our clinic review tool, if applicable. No additional management support is needed unless otherwise documented below in the visit note. 

## 2015-02-07 NOTE — Patient Instructions (Addendum)
Your EKG was Ok today  Please take all new medication as prescribed - the cialis  Please continue all other medications as before, and refills have been done if requested.  Please have the pharmacy call with any other refills you may need.  Please continue your efforts at being more active, low cholesterol diet, and weight control.  You are otherwise up to date with prevention measures today.  Please keep your appointments with your specialists as you may have planned  You will be contacted regarding the referral for: Dr Earlean Shawl for colonoscopy  Please go to the LAB in the Basement (turn left off the elevator) for the tests to be done at your convenience in the AM  You will be contacted by phone if any changes need to be made immediately.  Otherwise, you will receive a letter about your results with an explanation, but please check with MyChart first.  Please remember to sign up for MyChart if you have not done so, as this will be important to you in the future with finding out test results, communicating by private email, and scheduling acute appointments online when needed.  Please return in 1 year for your yearly visit, or sooner if needed, with Lab testing done 3-5 days before

## 2015-02-07 NOTE — Progress Notes (Signed)
Subjective:    Patient ID: Ronald Thompson, male    DOB: 1956/01/17, 59 y.o.   MRN: [id number]  HPI  Here for wellness and f/u;  Overall doing ok;  Pt denies Chest pain, worsening SOB, DOE, wheezing, orthopnea, PND, worsening LE edema, palpitations, dizziness or syncope.  Pt denies neurological change such as new headache, facial or extremity weakness.  Pt denies polydipsia, polyuria, or low sugar symptoms. Pt states overall good compliance with treatment and medications, good tolerability, and has been trying to follow appropriate diet.  Pt denies worsening depressive symptoms, suicidal ideation or panic. No fever, night sweats, wt loss, loss of appetite, or other constitutional symptoms.  Pt states good ability with ADL's, has low fall risk, home safety reviewed and adequate, no other significant changes in hearing or vision, and only occasionally active with exercise.  Seeing Dr Sherrill/oncology, getting sandostatin monthly, CT q 3 months.  Does c/o ongoing fatigue, but denies signficant daytime hypersomnolence.  Has nocturia up to 3 timesper night, 3-4 BM's per day, ED worse now mod to severe for approx 1 yr, has normal desire.   Past Medical History  Diagnosis Date  . 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 Baker's cyst   . Hypertension   . Heart murmur   . Transfusion history     hx. 25 yrs ago-during cancer tx.  . 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 Lesion of left lung     hx.25 yrs ago- testicilar cancer related- only scarring left after tx.-no problems now  . Hypercholesteremia    Past Surgical History  Procedure Laterality Date  . Thoracotomy  1990    wedge biopsy of mediastinal mass  . Back surgery      '14- rupt. disc   . Knee surgery      age 78 for Baker's cyst  . Eus N/A 02/10/2014    Procedure: UPPER ENDOSCOPIC ULTRASOUND (EUS) LINEAR;  Surgeon: [name], MD;  Location: [facility];  Service: Endoscopy;   Laterality: N/A;    reports that he has never smoked. He has never used smokeless tobacco. He reports that he drinks alcohol. He reports that he does not use illicit drugs. family history includes Cancer in his maternal aunt and paternal grandfather; Dementia in his mother; Emphysema in his maternal aunt. Allergies  Allergen Reactions  . Penicillins Hives    Entire body.   Current Outpatient Prescriptions on File Prior to Visit  Medication Sig Dispense Refill  . acetaminophen-codeine (TYLENOL #3) 300-30 MG per tablet Take 1 tablet by mouth as needed.    . calcium carbonate (TUMS - DOSED IN MG ELEMENTAL CALCIUM) 500 MG chewable tablet Chew 3 tablets by mouth as needed for indigestion or heartburn.    . Coenzyme Q10 (COQ10) 100 MG CAPS Take 1 capsule by mouth daily.    . Cyanocobalamin (VITAMIN B 12 PO) Take 1 tablet by mouth daily.    Marland Kitchen docusate sodium (COLACE) 100 MG capsule Take 100 mg by mouth 2 (two) times daily.    Marland Kitchen ibuprofen (ADVIL,MOTRIN) 200 MG tablet Take 600 mg by mouth every 6 (six) hours as needed for mild pain or moderate pain.     Marland Kitchen losartan (COZAAR) 50 MG tablet Take 1 tablet (50 mg total) by mouth every morning. 90 tablet 3  . Octreotide Acetate (SANDOSTATIN IJ) Inject as directed.    . ondansetron (ZOFRAN) 4 MG  tablet Take 4 mg by mouth as needed.    Marland Kitchen oxyCODONE (OXY IR/ROXICODONE) 5 MG immediate release tablet Take 5 mg by mouth as needed.    . simvastatin (ZOCOR) 20 MG tablet Take 20 mg by mouth every evening.     No current facility-administered medications on file prior to visit.     Review of Systems Constitutional: Negative for increased diaphoresis, other activity, appetite or siginficant weight change other than noted HENT: Negative for worsening hearing loss, ear pain, facial swelling, mouth sores and neck stiffness.   Eyes: Negative for other worsening pain, redness or visual disturbance.  Respiratory: Negative for shortness of breath and wheezing    Cardiovascular: Negative for chest pain and palpitations.  Gastrointestinal: Negative for diarrhea, blood in stool, abdominal distention or other pain Genitourinary: Negative for hematuria, flank pain or change in urine volume.  Musculoskeletal: Negative for myalgias or other joint complaints.  Skin: Negative for color change and wound or drainage.  Neurological: Negative for syncope and numbness. other than noted Hematological: Negative for adenopathy. or other swelling Psychiatric/Behavioral: Negative for hallucinations, SI, self-injury, decreased concentration or other worsening agitation.      Objective:   Physical Exam BP 128/76 mmHg  Pulse 84  Temp(Src) 98.3 F (36.8 C) (Oral)  Ht 5\' 10"  (1.778 m)  Wt 184 lb (83.462 kg)  BMI 26.40 kg/m2  SpO2 94% VS noted,  Constitutional: Pt is oriented to person, place, and time. Appears well-developed and well-nourished, in no significant distress Head: Normocephalic and atraumatic.  Right Ear: External ear normal.  Left Ear: External ear normal.  Nose: Nose normal.  Mouth/Throat: Oropharynx is clear and moist.  Eyes: Conjunctivae and EOM are normal. Pupils are equal, round, and reactive to light.  Neck: Normal range of motion. Neck supple. No JVD present. No tracheal deviation present or significant neck LA or mass Cardiovascular: Normal rate, regular rhythm, normal heart sounds and intact distal pulses.   Pulmonary/Chest: Effort normal and breath sounds without rales or wheezing  Abdominal: Soft. Bowel sounds are normal. NT. No HSM  Musculoskeletal: Normal range of motion. Exhibits no edema.  Lymphadenopathy:  Has no cervical adenopathy.  Neurological: Pt is alert and oriented to person, place, and time. Pt has normal reflexes. No cranial nerve deficit. Motor grossly intact Skin: Skin is warm and dry. No rash noted.  Psychiatric:  Has normal mood and affect. Behavior is normal.      Assessment & Plan:

## 2015-02-10 ENCOUNTER — Other Ambulatory Visit (INDEPENDENT_AMBULATORY_CARE_PROVIDER_SITE_OTHER): Payer: BLUE CROSS/BLUE SHIELD

## 2015-02-10 DIAGNOSIS — R5383 Other fatigue: Secondary | ICD-10-CM

## 2015-02-10 LAB — TESTOSTERONE: Testosterone: 223.12 ng/dL — ABNORMAL LOW (ref 300.00–890.00)

## 2015-02-10 LAB — CORTISOL: CORTISOL PLASMA: 9.2 ug/dL

## 2015-02-10 NOTE — Assessment & Plan Note (Signed)
Etiology unclear, Exam otherwise benign, to check labs as documented, follow with expectant management  

## 2015-02-10 NOTE — Assessment & Plan Note (Signed)
stable overall by history and exam, recent data reviewed with pt, and pt to continue medical treatment as before,  to f/u any worsening symptoms or concerns Lab Results  Component Value Date   LDLCALC 129* 02/03/2015

## 2015-02-10 NOTE — Assessment & Plan Note (Signed)
For cialis prn,  to f/u any worsening symptoms or concerns 

## 2015-02-23 ENCOUNTER — Encounter: Payer: Self-pay | Admitting: Internal Medicine

## 2015-02-28 ENCOUNTER — Ambulatory Visit (HOSPITAL_COMMUNITY)
Admission: RE | Admit: 2015-02-28 | Discharge: 2015-02-28 | Disposition: A | Discharge status: Home / Self Care | Payer: BLUE CROSS/BLUE SHIELD | Source: Ambulatory Visit | Attending: Oncology | Admitting: Oncology

## 2015-02-28 ENCOUNTER — Other Ambulatory Visit (HOSPITAL_BASED_OUTPATIENT_CLINIC_OR_DEPARTMENT_OTHER): Payer: BLUE CROSS/BLUE SHIELD

## 2015-02-28 ENCOUNTER — Encounter (HOSPITAL_COMMUNITY): Payer: Self-pay

## 2015-02-28 ENCOUNTER — Ambulatory Visit: Payer: BLUE CROSS/BLUE SHIELD

## 2015-02-28 DIAGNOSIS — C7A8 Other malignant neuroendocrine tumors: Secondary | ICD-10-CM | POA: Diagnosis not present

## 2015-02-28 DIAGNOSIS — Z08 Encounter for follow-up examination after completed treatment for malignant neoplasm: Secondary | ICD-10-CM | POA: Insufficient documentation

## 2015-02-28 DIAGNOSIS — C787 Secondary malignant neoplasm of liver and intrahepatic bile duct: Secondary | ICD-10-CM | POA: Insufficient documentation

## 2015-02-28 DIAGNOSIS — D3A8 Other benign neuroendocrine tumors: Secondary | ICD-10-CM | POA: Diagnosis not present

## 2015-02-28 LAB — COMPREHENSIVE METABOLIC PANEL (CC13)
ALBUMIN: 4.1 g/dL (ref 3.5–5.0)
ALT: 28 U/L (ref 0–55)
ANION GAP: 7 meq/L (ref 3–11)
AST: 25 U/L (ref 5–34)
Alkaline Phosphatase: 61 U/L (ref 40–150)
BUN: 18.6 mg/dL (ref 7.0–26.0)
CALCIUM: 9.4 mg/dL (ref 8.4–10.4)
CHLORIDE: 102 meq/L (ref 98–109)
CO2: 30 meq/L — AB (ref 22–29)
Creatinine: 1 mg/dL (ref 0.7–1.3)
EGFR: 81 mL/min/{1.73_m2} — ABNORMAL LOW (ref >90)
Glucose: 107 mg/dl (ref 70–140)
Potassium: 4.4 mEq/L (ref 3.5–5.1)
SODIUM: 139 meq/L (ref 136–145)
Total Bilirubin: 1.5 mg/dL — ABNORMAL HIGH (ref 0.20–1.20)
Total Protein: 6.7 g/dL (ref 6.4–8.3)

## 2015-02-28 MED ORDER — IOHEXOL 300 MG/ML  SOLN
100.0000 mL | Freq: Once | INTRAMUSCULAR | Status: AC | PRN
  Administered 2015-02-28: 100 mL via INTRAVENOUS

## 2015-03-02 ENCOUNTER — Ambulatory Visit (HOSPITAL_BASED_OUTPATIENT_CLINIC_OR_DEPARTMENT_OTHER): Payer: BLUE CROSS/BLUE SHIELD

## 2015-03-02 ENCOUNTER — Telehealth: Payer: Self-pay | Admitting: Oncology

## 2015-03-02 ENCOUNTER — Ambulatory Visit (HOSPITAL_BASED_OUTPATIENT_CLINIC_OR_DEPARTMENT_OTHER): Payer: BLUE CROSS/BLUE SHIELD | Admitting: Oncology

## 2015-03-02 VITALS — BP 145/61 | HR 84 | Temp 98.4°F | Resp 17 | Ht 70.0 in | Wt 183.4 lb

## 2015-03-02 DIAGNOSIS — C7A8 Other malignant neuroendocrine tumors: Secondary | ICD-10-CM

## 2015-03-02 DIAGNOSIS — C7B8 Other secondary neuroendocrine tumors: Secondary | ICD-10-CM

## 2015-03-02 DIAGNOSIS — D3A8 Other benign neuroendocrine tumors: Secondary | ICD-10-CM

## 2015-03-02 DIAGNOSIS — R0609 Other forms of dyspnea: Secondary | ICD-10-CM

## 2015-03-02 MED ORDER — OCTREOTIDE ACETATE 30 MG IM KIT
30.0000 mg | PACK | Freq: Once | INTRAMUSCULAR | Status: AC
  Administered 2015-03-02: 30 mg via INTRAMUSCULAR
  Filled 2015-03-02: qty 1

## 2015-03-02 NOTE — Telephone Encounter (Signed)
Pt confirmed MD visit per 07/28 POF, gave pt AVS and Calendar.Marland Kitchen KJ

## 2015-03-02 NOTE — Progress Notes (Signed)
[place] OFFICE PROGRESS NOTE   Diagnosis: Pancreatic neuroendocrine tumor  INTERVAL HISTORY:   Ronald Thompson returns as scheduled. He feels well. He has occasional night sweats. Stable dyspnea on exertion. He is exercising. Good appetite.  Objective:  Vital signs in last 24 hours:  Blood pressure 145/61, pulse 84, temperature 98.4 F (36.9 C), temperature source Oral, resp. rate 17, height 5\' 10"  (1.778 m), weight 183 lb 6.4 oz (83.19 kg), SpO2 97 %.    HEENT: Neck without mass Lymphatics: No cervical, supraclavicular, axillary, or inguinal nodes Resp: Slight decrease in breath sounds at the left upper anterior chest, good air movement bilaterally, no respiratory distress Cardio: Regular rate and rhythm, 2/6 systolic murmur GI: No hepatosplenomegaly, nontender, no mass Vascular: No leg edema  Imaging:  Ct Abdomen Pelvis W Contrast  02/28/2015   CLINICAL DATA:  Neuroendocrine tumor with liver metastasis. Which a lipoma  EXAM: CT ABDOMEN AND PELVIS WITH CONTRAST  TECHNIQUE: Multidetector CT imaging of the abdomen and pelvis was performed using the standard protocol following bolus administration of intravenous contrast.  CONTRAST:  19mL OMNIPAQUE IOHEXOL 300 MG/ML  SOLN  COMPARISON:  CT 10/31/2014  FINDINGS: Lower chest: Lung bases are clear.  Hepatobiliary: Multiple round low-attenuation lesions liver consistent with hepatic metastasis. The number and size lesions are not changed. For example 2.3 x 3.1 cm lesion in the posterior right hepatic lobe (image 47, series 2) is unchanged from the 2.4 x 3.10 cm. Lesion in the lateral left hepatic lobe along the falciform ligament measures 26 mm compared to 25 mm (image 69, series 2). Multiple additional lesions noted primarily in the right hepatic lobe.  Pancreas: Subtle low-attenuation region within the head of the pancreas measures 9 mm on image 49, series 4 which is unchanged. No pancreatic duct dilatation.  Spleen: Normal  spleen  Adrenals/urinary tract: Adrenal glands and kidneys are normal. The ureters and bladder normal.  Stomach/Bowel: Stomach, small bowel, appendix, and cecum are normal. The colon and rectosigmoid colon are normal.  Vascular/Lymphatic: Abdominal aorta is normal caliber. There is no retroperitoneal or periportal lymphadenopathy. No pelvic lymphadenopathy.  Reproductive: Prostate is normal.  Musculoskeletal: No aggressive osseous lesion.  Other: No free fluid.  IMPRESSION: 1. Stable hepatic metastasis. 2. Small hypodense lesions in the pancreas head not changed. Recommend additional follow-up. 3. No evidence disease progression.   Electronically Signed   By: Suzy Bouchard M.D.   On: 02/28/2015 10:45    Medications: I have reviewed the patient's current medications.  Assessment/Plan: 1. Pancreatic neuroendocrine tumor, WHO grade 2, pancreatic head mass and small peripancreatic/celiac nodes on a CT 02/04/2014  EUS revealed evidence of multiple liver metastases-status post an FNA biopsy of a left liver lesion 02/10/2014 confirming a neuroendocrine tumor   Octreotide scan 04/01/2014 with multiple foci of metastatic neuroendocrine tumor in the liver  Monthly Sandostatin started 03/16/2014  Restaging CT 07/04/2014 with resolution of a previously noted pancreas head lesion and probable progression of liver metastases  Restaging CT 10/31/2014-stable liver lesions, no evidence of a pancreas mass, stable.  Restaging CT 02/28/2015-stable hepatic metastases, stable pancreas lesion unchanged   2. Chest Seminoma in 1990 treated with BEP and chest radiation at [facility] 3. 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 chronic exertional dyspnea following treatment for the seminoma  5. aortic stenosis on an echocardiogram December 2011  6. lumbar disc  surgery  2014  7. acute abdominal pain 02/04/2014-resolved     Disposition:  Ronald Thompson appears stable. No clinical or CT evidence of disease progression. He will continue monthly Sandostatin. He will return for an office visit in 4 months. We will plan for a restaging CT at a six-month interval.  Betsy Coder, MD  03/02/2015  11:26 AM

## 2015-03-03 ENCOUNTER — Ambulatory Visit: Payer: BLUE CROSS/BLUE SHIELD

## 2015-03-03 ENCOUNTER — Ambulatory Visit: Payer: BLUE CROSS/BLUE SHIELD | Admitting: Oncology

## 2015-03-04 ENCOUNTER — Other Ambulatory Visit: Payer: Self-pay | Admitting: Internal Medicine

## 2015-03-20 ENCOUNTER — Other Ambulatory Visit: Payer: Self-pay | Admitting: Internal Medicine

## 2015-04-03 ENCOUNTER — Ambulatory Visit (HOSPITAL_BASED_OUTPATIENT_CLINIC_OR_DEPARTMENT_OTHER): Payer: BLUE CROSS/BLUE SHIELD

## 2015-04-03 VITALS — BP 131/64 | HR 80 | Temp 98.7°F

## 2015-04-03 DIAGNOSIS — C7A8 Other malignant neuroendocrine tumors: Secondary | ICD-10-CM | POA: Diagnosis not present

## 2015-04-03 DIAGNOSIS — C7B8 Other secondary neuroendocrine tumors: Secondary | ICD-10-CM

## 2015-04-03 DIAGNOSIS — D3A8 Other benign neuroendocrine tumors: Secondary | ICD-10-CM

## 2015-04-03 MED ORDER — OCTREOTIDE ACETATE 30 MG IM KIT
30.0000 mg | PACK | Freq: Once | INTRAMUSCULAR | Status: AC
  Administered 2015-04-03: 30 mg via INTRAMUSCULAR
  Filled 2015-04-03: qty 1

## 2015-05-04 ENCOUNTER — Ambulatory Visit (HOSPITAL_BASED_OUTPATIENT_CLINIC_OR_DEPARTMENT_OTHER): Payer: BLUE CROSS/BLUE SHIELD

## 2015-05-04 VITALS — BP 116/70 | HR 77 | Temp 98.6°F

## 2015-05-04 DIAGNOSIS — C7B8 Other secondary neuroendocrine tumors: Secondary | ICD-10-CM | POA: Diagnosis not present

## 2015-05-04 DIAGNOSIS — C7A8 Other malignant neuroendocrine tumors: Secondary | ICD-10-CM | POA: Diagnosis not present

## 2015-05-04 DIAGNOSIS — D3A8 Other benign neuroendocrine tumors: Secondary | ICD-10-CM

## 2015-05-04 MED ORDER — OCTREOTIDE ACETATE 30 MG IM KIT
30.0000 mg | PACK | Freq: Once | INTRAMUSCULAR | Status: AC
  Administered 2015-05-04: 30 mg via INTRAMUSCULAR
  Filled 2015-05-04: qty 1

## 2015-05-24 ENCOUNTER — Encounter: Payer: Self-pay | Admitting: Oncology

## 2015-05-24 NOTE — Progress Notes (Signed)
I placed [employer] forms on desk for nurse of dr. [name].

## 2015-05-25 ENCOUNTER — Encounter: Payer: Self-pay | Admitting: Oncology

## 2015-05-25 NOTE — Progress Notes (Signed)
I faxed [employer] forms to  [phone]

## 2015-05-26 ENCOUNTER — Encounter: Payer: Self-pay | Admitting: Oncology

## 2015-05-26 NOTE — Progress Notes (Signed)
The patient is coming by to pick up his copy(original). I told him it had been faxed also.

## 2015-05-30 ENCOUNTER — Other Ambulatory Visit: Payer: Self-pay | Admitting: Internal Medicine

## 2015-06-02 ENCOUNTER — Ambulatory Visit (HOSPITAL_BASED_OUTPATIENT_CLINIC_OR_DEPARTMENT_OTHER): Payer: BLUE CROSS/BLUE SHIELD

## 2015-06-02 VITALS — BP 128/58 | HR 76 | Temp 98.4°F

## 2015-06-02 DIAGNOSIS — C7B8 Other secondary neuroendocrine tumors: Secondary | ICD-10-CM

## 2015-06-02 DIAGNOSIS — C7A8 Other malignant neuroendocrine tumors: Secondary | ICD-10-CM | POA: Diagnosis not present

## 2015-06-02 DIAGNOSIS — D3A8 Other benign neuroendocrine tumors: Secondary | ICD-10-CM

## 2015-06-02 MED ORDER — OCTREOTIDE ACETATE 30 MG IM KIT
30.0000 mg | PACK | Freq: Once | INTRAMUSCULAR | Status: AC
  Administered 2015-06-02: 30 mg via INTRAMUSCULAR
  Filled 2015-06-02: qty 1

## 2015-07-03 ENCOUNTER — Ambulatory Visit (HOSPITAL_BASED_OUTPATIENT_CLINIC_OR_DEPARTMENT_OTHER): Payer: BLUE CROSS/BLUE SHIELD | Admitting: Oncology

## 2015-07-03 ENCOUNTER — Ambulatory Visit (HOSPITAL_BASED_OUTPATIENT_CLINIC_OR_DEPARTMENT_OTHER): Payer: BLUE CROSS/BLUE SHIELD

## 2015-07-03 VITALS — BP 149/61 | HR 80 | Temp 98.8°F | Resp 18 | Ht 70.0 in | Wt 185.3 lb

## 2015-07-03 DIAGNOSIS — C7A8 Other malignant neuroendocrine tumors: Secondary | ICD-10-CM | POA: Diagnosis not present

## 2015-07-03 DIAGNOSIS — C7B8 Other secondary neuroendocrine tumors: Secondary | ICD-10-CM

## 2015-07-03 DIAGNOSIS — D3A8 Other benign neuroendocrine tumors: Secondary | ICD-10-CM

## 2015-07-03 MED ORDER — OCTREOTIDE ACETATE 30 MG IM KIT
30.0000 mg | PACK | Freq: Once | INTRAMUSCULAR | Status: AC
  Administered 2015-07-03: 30 mg via INTRAMUSCULAR
  Filled 2015-07-03: qty 1

## 2015-07-03 NOTE — Progress Notes (Signed)
  [name] OFFICE PROGRESS NOTE   Diagnosis: Neuroendocrine tumor  INTERVAL HISTORY:   Ronald Thompson returns as scheduled. He continues monthly Sandostatin. No fever, flushing, or diarrhea. Stable exertional dyspnea. No other complaint.  Objective:  Vital signs in last 24 hours:  Blood pressure 149/61, pulse 80, temperature 98.8 F (37.1 C), temperature source Oral, resp. rate 18, height 5\' 10"  (1.778 m), weight 185 lb 4.8 oz (84.052 kg), SpO2 97 %.    HEENT: Neck without mass Lymphatics: No cervical or supra-clavicular nodes Resp: Decreased breath sounds at the left upper chest, no respiratory distress Cardio: Regular rate and rhythm, turgor 6 systolic murmur GI: No hepatomegaly, nontender, no mass Vascular: No leg edema    Medications: I have reviewed the patient's current medications.  Assessment/Plan: 1. Pancreatic neuroendocrine tumor, WHO grade 2, pancreatic head mass and small peripancreatic/celiac nodes on a CT 02/04/2014  EUS revealed evidence of multiple liver metastases-status post an FNA biopsy of a left liver lesion 02/10/2014 confirming a neuroendocrine tumor   Octreotide scan 04/01/2014 with multiple foci of metastatic neuroendocrine tumor in the liver  Monthly Sandostatin started 03/16/2014  Restaging CT 07/04/2014 with resolution of a previously noted pancreas head lesion and probable progression of liver metastases  Restaging CT 10/31/2014-stable liver lesions, no evidence of a pancreas mass, stable.  Restaging CT 02/28/2015-stable hepatic metastases, stable pancreas lesion unchanged  2. Chest Seminoma in 1990 treated with BEP and chest radiation at [facility] 3. 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 chronic exertional dyspnea following treatment for the seminoma  5. aortic stenosis on an  echocardiogram December 2011  6. lumbar disc surgery 2014  7. acute abdominal pain 02/04/2014-resolved      Disposition:  Ronald Thompson appears stable. The plan is to continue monthly Sandostatin. He will return for an office visit and restaging CT in 2 months.  Betsy Coder, MD  07/03/2015  11:25 AM

## 2015-07-11 ENCOUNTER — Other Ambulatory Visit: Payer: Self-pay | Admitting: Internal Medicine

## 2015-07-11 ENCOUNTER — Other Ambulatory Visit: Payer: Self-pay | Admitting: *Deleted

## 2015-07-12 ENCOUNTER — Telehealth: Payer: Self-pay | Admitting: Oncology

## 2015-07-12 NOTE — Telephone Encounter (Signed)
s.w. pt and advised on Jan appt.....pt ok and aware °

## 2015-08-02 ENCOUNTER — Ambulatory Visit (HOSPITAL_BASED_OUTPATIENT_CLINIC_OR_DEPARTMENT_OTHER): Payer: BLUE CROSS/BLUE SHIELD

## 2015-08-02 VITALS — BP 130/63 | HR 77 | Temp 98.2°F

## 2015-08-02 DIAGNOSIS — C7A8 Other malignant neuroendocrine tumors: Secondary | ICD-10-CM | POA: Diagnosis not present

## 2015-08-02 DIAGNOSIS — D3A8 Other benign neuroendocrine tumors: Secondary | ICD-10-CM

## 2015-08-02 DIAGNOSIS — C7B8 Other secondary neuroendocrine tumors: Secondary | ICD-10-CM | POA: Diagnosis not present

## 2015-08-02 MED ORDER — OCTREOTIDE ACETATE 30 MG IM KIT
30.0000 mg | PACK | Freq: Once | INTRAMUSCULAR | Status: AC
  Administered 2015-08-02: 30 mg via INTRAMUSCULAR
  Filled 2015-08-02: qty 1

## 2015-08-30 ENCOUNTER — Encounter (HOSPITAL_COMMUNITY): Payer: Self-pay

## 2015-08-30 ENCOUNTER — Other Ambulatory Visit (HOSPITAL_BASED_OUTPATIENT_CLINIC_OR_DEPARTMENT_OTHER): Payer: BLUE CROSS/BLUE SHIELD

## 2015-08-30 ENCOUNTER — Ambulatory Visit (HOSPITAL_COMMUNITY)
Admission: RE | Admit: 2015-08-30 | Discharge: 2015-08-30 | Disposition: A | Discharge status: Home / Self Care | Payer: BLUE CROSS/BLUE SHIELD | Source: Ambulatory Visit | Attending: Oncology | Admitting: Oncology

## 2015-08-30 DIAGNOSIS — K8020 Calculus of gallbladder without cholecystitis without obstruction: Secondary | ICD-10-CM | POA: Diagnosis not present

## 2015-08-30 DIAGNOSIS — C787 Secondary malignant neoplasm of liver and intrahepatic bile duct: Secondary | ICD-10-CM | POA: Insufficient documentation

## 2015-08-30 DIAGNOSIS — C7A8 Other malignant neuroendocrine tumors: Secondary | ICD-10-CM | POA: Diagnosis not present

## 2015-08-30 DIAGNOSIS — K769 Liver disease, unspecified: Secondary | ICD-10-CM | POA: Diagnosis present

## 2015-08-30 DIAGNOSIS — C7B8 Other secondary neuroendocrine tumors: Secondary | ICD-10-CM

## 2015-08-30 DIAGNOSIS — D3A8 Other benign neuroendocrine tumors: Secondary | ICD-10-CM

## 2015-08-30 LAB — COMPREHENSIVE METABOLIC PANEL
ALT: 25 U/L (ref 0–55)
ANION GAP: 7 meq/L (ref 3–11)
AST: 24 U/L (ref 5–34)
Albumin: 4.2 g/dL (ref 3.5–5.0)
Alkaline Phosphatase: 60 U/L (ref 40–150)
BUN: 20.2 mg/dL (ref 7.0–26.0)
CHLORIDE: 100 meq/L (ref 98–109)
CO2: 30 mEq/L — ABNORMAL HIGH (ref 22–29)
CREATININE: 1 mg/dL (ref 0.7–1.3)
Calcium: 9.5 mg/dL (ref 8.4–10.4)
EGFR: 82 mL/min/{1.73_m2} — ABNORMAL LOW (ref >90)
GLUCOSE: 103 mg/dL (ref 70–140)
Potassium: 4.6 mEq/L (ref 3.5–5.1)
SODIUM: 137 meq/L (ref 136–145)
Total Bilirubin: 1.08 mg/dL (ref 0.20–1.20)
Total Protein: 7.2 g/dL (ref 6.4–8.3)

## 2015-08-30 MED ORDER — IOHEXOL 300 MG/ML  SOLN
100.0000 mL | Freq: Once | INTRAMUSCULAR | Status: AC | PRN
  Administered 2015-08-30: 100 mL via INTRAVENOUS

## 2015-08-31 ENCOUNTER — Telehealth: Payer: Self-pay | Admitting: Oncology

## 2015-08-31 ENCOUNTER — Ambulatory Visit (HOSPITAL_BASED_OUTPATIENT_CLINIC_OR_DEPARTMENT_OTHER): Payer: BLUE CROSS/BLUE SHIELD | Admitting: Oncology

## 2015-08-31 ENCOUNTER — Other Ambulatory Visit: Payer: BLUE CROSS/BLUE SHIELD

## 2015-08-31 VITALS — BP 143/66 | HR 87 | Temp 97.6°F | Resp 18 | Ht 70.0 in | Wt 185.5 lb

## 2015-08-31 DIAGNOSIS — C7A8 Other malignant neuroendocrine tumors: Secondary | ICD-10-CM | POA: Diagnosis not present

## 2015-08-31 DIAGNOSIS — D3A8 Other benign neuroendocrine tumors: Secondary | ICD-10-CM

## 2015-08-31 DIAGNOSIS — C7B8 Other secondary neuroendocrine tumors: Secondary | ICD-10-CM | POA: Diagnosis not present

## 2015-08-31 DIAGNOSIS — R0609 Other forms of dyspnea: Secondary | ICD-10-CM | POA: Diagnosis not present

## 2015-08-31 MED ORDER — OCTREOTIDE ACETATE 30 MG IM KIT
30.0000 mg | PACK | Freq: Once | INTRAMUSCULAR | Status: AC
  Administered 2015-08-31: 30 mg via INTRAMUSCULAR
  Filled 2015-08-31: qty 1

## 2015-08-31 NOTE — Telephone Encounter (Signed)
per pof to sch pt appt-gave pt copy of avs °

## 2015-08-31 NOTE — Progress Notes (Signed)
[place] OFFICE PROGRESS NOTE   Diagnosis: Pancreatic neuroendocrine tumor  INTERVAL HISTORY:   Ronald Thompson returns as scheduled. He continues monthly Sandostatin. No diarrhea or fever. Occasional night sweats. Good appetite. He has noted increased exertional dyspnea over the past year. He is able to go about his usual activities, but fatigues more easily.  Objective:  Vital signs in last 24 hours:  Blood pressure 143/66, pulse 87, temperature 97.6 F (36.4 C), temperature source Oral, resp. rate 18, height 5\' 10"  (1.778 m), weight 185 lb 8 oz (84.142 kg), SpO2 100 %.    HEENT: Neck without mass Lymphatics: No cervical, supra-clavicular, axillary, or inguinal nodes Resp: Decreased breath sounds at the left lower chest, no respiratory distress Cardio: Regular rate and rhythm, 2/6 systolic murmur GI: No hepatosplenomegaly, nontender, no mass Vascular: No leg edema    Imaging:  Ct Abdomen Pelvis W Contrast  08/30/2015  CLINICAL DATA:  Pancreatic neuroendocrine tumor, follow-up liver lesions. EXAM: CT ABDOMEN AND PELVIS WITH CONTRAST TECHNIQUE: Multidetector CT imaging of the abdomen and pelvis was performed using the standard protocol following bolus administration of intravenous contrast. CONTRAST:  11mL OMNIPAQUE IOHEXOL 300 MG/ML  SOLN COMPARISON:  02/28/2015. FINDINGS: Lower chest: Subpleural nodularity in the right middle and right lower lobes measures up to 8 mm (series 7, image 1), unchanged from baseline examination of 02/04/2014. Left hemidiaphragm is elevated. Left lung base is not visualized. Heart size normal. No pericardial or pleural effusion. Hepatobiliary: Heterogeneous intermediate density lesions in the liver have enlarged from 02/28/2015 and measure up to 3.4 x 4.3 cm (series 2, image 23), previously 2.3 x 3.1 cm. A stone is seen in the fundus of the gallbladder. No biliary ductal dilatation. Pancreas: Subtle pancreatic neck lesion measures approximately  1.4 cm (series 4, image 29 and series 6, image 48), stable. Pancreas is otherwise unremarkable. No ductal dilatation. Spleen: Visualized portion is unremarkable. Adrenals/Urinary Tract: Adrenal glands are unremarkable. Low-attenuation lesions in the kidneys measure up to 3.6 cm on the right, are stable and statistically, likely represent cysts. Ureters are decompressed. Bladder is grossly unremarkable. Stomach/Bowel: Visualized portions of the stomach are unremarkable. Small bowel, appendix and colon are unremarkable. Vascular/Lymphatic: Atherosclerotic calcification of the arterial vasculature without abdominal aortic aneurysm. No pathologically enlarged lymph nodes. Reproductive: Prostate is at the upper limits of normal in size. Other: Small bilateral inguinal hernias contain fat and on the left, also a knuckle of small bowel. No free fluid. Mesenteries and peritoneum are unremarkable. Musculoskeletal: No worrisome lytic or sclerotic lesions. IMPRESSION: 1. Stable pancreatic neck primary neuroendocrine tumor with slight progression in hepatic metastatic disease. 2. Cholelithiasis. Electronically Signed   By: Lorin Picket M.D.   On: 08/30/2015 10:52   CT images reviewed with Ronald Thompson   Medications: I have reviewed the patient's current medications.  Assessment/Plan: 1. Pancreatic neuroendocrine tumor, WHO grade 2, pancreatic head mass and small peripancreatic/celiac nodes on a CT 02/04/2014  EUS revealed evidence of multiple liver metastases-status post an FNA biopsy of a left liver lesion 02/10/2014 confirming a neuroendocrine tumor   Octreotide scan 04/01/2014 with multiple foci of metastatic neuroendocrine tumor in the liver  Monthly Sandostatin started 03/16/2014  Restaging CT 07/04/2014 with resolution of a previously noted pancreas head lesion and probable progression of liver metastases  Restaging CT 10/31/2014-stable liver lesions, no evidence of a pancreas mass, stable.  Restaging  CT 02/28/2015-stable hepatic metastases, stable pancreas lesion unchanged  Restaging CT 08/30/2015-stable pancreas mass, slight enlargement of hepatic metastases  Monthly Sandostatin continued  2. Chest Seminoma in 1990 treated with BEP and chest radiation at [facility] 3. 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 chronic exertional dyspnea following treatment for the seminoma  5. aortic stenosis on an echocardiogram December 2011  6. lumbar disc surgery 2014  7. acute abdominal pain 02/04/2014-resolved   Disposition:  I reviewed the restaging images with Ronald Thompson. There appears to be slight progression of the size of liver lesions over the past 6 months. He will continue monthly Sandostatin. We discussed the interim pending approval of the [id number] DOTATATE agent. He may be small for treatment with this radioisotope labeled peptide within the next several months.  The exertional dyspnea is likely related to chronic lung disease following treatment for a seminoma. He plans to schedule an appointment with Dr. Jenny Reichmann to further evaluate the dyspnea. He also has a heart murmur. The dyspnea could be related to cardiac disease.  He will discuss obtaining the pneumococcal vaccines with Dr. Jenny Reichmann.  RonaldThompson will return for an office visit in 3 months.  Betsy Coder, MD  08/31/2015  5:19 PM

## 2015-09-01 LAB — CHROMOGRANIN A: CHROMOGRAN A: 2 nmol/L (ref 0–5)

## 2015-09-02 LAB — CHROMOGRANIN A (PARALLEL TESTING): CHROMOGRANIN A: 13 ng/mL (ref <=15)

## 2015-10-02 ENCOUNTER — Ambulatory Visit (HOSPITAL_BASED_OUTPATIENT_CLINIC_OR_DEPARTMENT_OTHER): Payer: BLUE CROSS/BLUE SHIELD

## 2015-10-02 VITALS — BP 135/60 | HR 70 | Temp 97.8°F

## 2015-10-02 DIAGNOSIS — D3A8 Other benign neuroendocrine tumors: Secondary | ICD-10-CM

## 2015-10-02 DIAGNOSIS — C7B8 Other secondary neuroendocrine tumors: Secondary | ICD-10-CM

## 2015-10-02 DIAGNOSIS — C7A8 Other malignant neuroendocrine tumors: Secondary | ICD-10-CM

## 2015-10-02 MED ORDER — OCTREOTIDE ACETATE 30 MG IM KIT
30.0000 mg | PACK | Freq: Once | INTRAMUSCULAR | Status: AC
  Administered 2015-10-02: 30 mg via INTRAMUSCULAR
  Filled 2015-10-02: qty 1

## 2015-10-12 ENCOUNTER — Telehealth: Payer: Self-pay | Admitting: Oncology

## 2015-10-12 NOTE — Telephone Encounter (Signed)
returned call and lvm with new d.t per pt request

## 2015-10-31 ENCOUNTER — Ambulatory Visit: Payer: BLUE CROSS/BLUE SHIELD

## 2015-11-03 ENCOUNTER — Ambulatory Visit (HOSPITAL_BASED_OUTPATIENT_CLINIC_OR_DEPARTMENT_OTHER): Payer: BLUE CROSS/BLUE SHIELD

## 2015-11-03 VITALS — BP 144/63 | HR 83 | Temp 97.9°F | Resp 18

## 2015-11-03 DIAGNOSIS — C7A8 Other malignant neuroendocrine tumors: Secondary | ICD-10-CM

## 2015-11-03 DIAGNOSIS — C7B8 Other secondary neuroendocrine tumors: Secondary | ICD-10-CM | POA: Diagnosis not present

## 2015-11-03 DIAGNOSIS — D3A8 Other benign neuroendocrine tumors: Secondary | ICD-10-CM

## 2015-11-03 MED ORDER — OCTREOTIDE ACETATE 30 MG IM KIT
30.0000 mg | PACK | Freq: Once | INTRAMUSCULAR | Status: AC
  Administered 2015-11-03: 30 mg via INTRAMUSCULAR
  Filled 2015-11-03: qty 1

## 2015-11-03 NOTE — Patient Instructions (Signed)

## 2015-11-08 ENCOUNTER — Encounter: Payer: Self-pay | Admitting: Internal Medicine

## 2015-11-08 ENCOUNTER — Ambulatory Visit (INDEPENDENT_AMBULATORY_CARE_PROVIDER_SITE_OTHER): Payer: BLUE CROSS/BLUE SHIELD | Admitting: Internal Medicine

## 2015-11-08 ENCOUNTER — Other Ambulatory Visit (INDEPENDENT_AMBULATORY_CARE_PROVIDER_SITE_OTHER): Payer: BLUE CROSS/BLUE SHIELD

## 2015-11-08 ENCOUNTER — Ambulatory Visit (INDEPENDENT_AMBULATORY_CARE_PROVIDER_SITE_OTHER)
Admission: RE | Admit: 2015-11-08 | Discharge: 2015-11-08 | Disposition: A | Discharge status: Home / Self Care | Payer: BLUE CROSS/BLUE SHIELD | Source: Ambulatory Visit | Attending: Internal Medicine | Admitting: Internal Medicine

## 2015-11-08 VITALS — BP 126/80 | HR 78 | Resp 20 | Wt 188.0 lb

## 2015-11-08 DIAGNOSIS — Z1159 Encounter for screening for other viral diseases: Secondary | ICD-10-CM

## 2015-11-08 DIAGNOSIS — J8410 Pulmonary fibrosis, unspecified: Secondary | ICD-10-CM | POA: Diagnosis not present

## 2015-11-08 DIAGNOSIS — Z0000 Encounter for general adult medical examination without abnormal findings: Secondary | ICD-10-CM

## 2015-11-08 DIAGNOSIS — R0602 Shortness of breath: Secondary | ICD-10-CM

## 2015-11-08 DIAGNOSIS — I350 Nonrheumatic aortic (valve) stenosis: Secondary | ICD-10-CM

## 2015-11-08 DIAGNOSIS — R351 Nocturia: Secondary | ICD-10-CM | POA: Diagnosis not present

## 2015-11-08 DIAGNOSIS — R6889 Other general symptoms and signs: Secondary | ICD-10-CM

## 2015-11-08 DIAGNOSIS — R0600 Dyspnea, unspecified: Secondary | ICD-10-CM

## 2015-11-08 DIAGNOSIS — Z0001 Encounter for general adult medical examination with abnormal findings: Secondary | ICD-10-CM | POA: Diagnosis not present

## 2015-11-08 DIAGNOSIS — H9193 Unspecified hearing loss, bilateral: Secondary | ICD-10-CM | POA: Insufficient documentation

## 2015-11-08 DIAGNOSIS — N529 Male erectile dysfunction, unspecified: Secondary | ICD-10-CM

## 2015-11-08 DIAGNOSIS — R0683 Snoring: Secondary | ICD-10-CM

## 2015-11-08 DIAGNOSIS — J342 Deviated nasal septum: Secondary | ICD-10-CM

## 2015-11-08 HISTORY — DX: Pulmonary fibrosis, unspecified: J84.10

## 2015-11-08 LAB — PSA: PSA: 1.06 ng/mL (ref 0.10–4.00)

## 2015-11-08 LAB — URINALYSIS, ROUTINE W REFLEX MICROSCOPIC
Bilirubin Urine: NEGATIVE
Leukocytes, UA: NEGATIVE
NITRITE: NEGATIVE
PH: 6 (ref 5.0–8.0)
Specific Gravity, Urine: 1.025 (ref 1.000–1.030)
TOTAL PROTEIN, URINE-UPE24: NEGATIVE
URINE GLUCOSE: NEGATIVE
Urobilinogen, UA: 0.2 (ref 0.0–1.0)

## 2015-11-08 LAB — CBC WITH DIFFERENTIAL/PLATELET
BASOS PCT: 0.5 % (ref 0.0–3.0)
Basophils Absolute: 0 10*3/uL (ref 0.0–0.1)
EOS ABS: 0.1 10*3/uL (ref 0.0–0.7)
EOS PCT: 0.7 % (ref 0.0–5.0)
HCT: 47 % (ref 39.0–52.0)
HEMOGLOBIN: 15.9 g/dL (ref 13.0–17.0)
LYMPHS ABS: 0.9 10*3/uL (ref 0.7–4.0)
Lymphocytes Relative: 11.4 % — ABNORMAL LOW (ref 12.0–46.0)
MCHC: 33.8 g/dL (ref 30.0–36.0)
MCV: 92.5 fl (ref 78.0–100.0)
MONO ABS: 0.7 10*3/uL (ref 0.1–1.0)
Monocytes Relative: 8.2 % (ref 3.0–12.0)
NEUTROS PCT: 79.2 % — AB (ref 43.0–77.0)
Neutro Abs: 6.5 10*3/uL (ref 1.4–7.7)
Platelets: 190 10*3/uL (ref 150.0–400.0)
RBC: 5.08 Mil/uL (ref 4.22–5.81)
RDW: 12.8 % (ref 11.5–15.5)
WBC: 8.2 10*3/uL (ref 4.0–10.5)

## 2015-11-08 LAB — LIPID PANEL
CHOLESTEROL: 158 mg/dL (ref 0–200)
HDL: 51.5 mg/dL (ref >39.00)
LDL Cholesterol: 92 mg/dL (ref 0–99)
NonHDL: 106.96
TRIGLYCERIDES: 75 mg/dL (ref 0.0–149.0)
Total CHOL/HDL Ratio: 3
VLDL: 15 mg/dL (ref 0.0–40.0)

## 2015-11-08 LAB — BASIC METABOLIC PANEL
BUN: 21 mg/dL (ref 6–23)
CHLORIDE: 98 meq/L (ref 96–112)
CO2: 34 mEq/L — ABNORMAL HIGH (ref 19–32)
Calcium: 9.9 mg/dL (ref 8.4–10.5)
Creatinine, Ser: 0.97 mg/dL (ref 0.40–1.50)
GFR: 83.35 mL/min (ref >60.00)
GLUCOSE: 98 mg/dL (ref 70–99)
POTASSIUM: 4.5 meq/L (ref 3.5–5.1)
SODIUM: 139 meq/L (ref 135–145)

## 2015-11-08 LAB — HEPATIC FUNCTION PANEL
ALBUMIN: 4.7 g/dL (ref 3.5–5.2)
ALK PHOS: 61 U/L (ref 39–117)
ALT: 28 U/L (ref 0–53)
AST: 25 U/L (ref 0–37)
Bilirubin, Direct: 0.2 mg/dL (ref 0.0–0.3)
Total Bilirubin: 1.1 mg/dL (ref 0.2–1.2)
Total Protein: 7.1 g/dL (ref 6.0–8.3)

## 2015-11-08 LAB — HEPATITIS C ANTIBODY: HCV Ab: NEGATIVE

## 2015-11-08 LAB — TSH: TSH: 1.65 u[IU]/mL (ref 0.35–4.50)

## 2015-11-08 NOTE — Patient Instructions (Addendum)
Your EKG was OK today  Your ears were irrigated of wax today  Please continue all other medications as before, and refills have been done if requested.  Please have the pharmacy call with any other refills you may need.  Please continue your efforts at being more active, low cholesterol diet, and weight control.  You are otherwise up to date with prevention measures today.  Please keep your appointments with your specialists as you may have planned  Please see the [facility] now for ECHO scheduling before you leave  You will be contacted regarding the referral for: PFT's (lung testing), ENT, and Urology, as well as Cardiology  Please go to the XRAY Department in the Basement (go straight as you get off the elevator) for the x-ray testing  Please go to the LAB in the Basement (turn left off the elevator) for the tests to be done today  You will be contacted by phone if any changes need to be made immediately.  Otherwise, you will receive a letter about your results with an explanation, but please check with MyChart first.  Please remember to sign up for MyChart if you have not done so, as this will be important to you in the future with finding out test results, communicating by private email, and scheduling acute appointments online when needed.  Please return in 1 year for your yearly visit, or sooner if needed

## 2015-11-08 NOTE — Progress Notes (Signed)
Subjective:    Patient ID: Ronald Thompson, male    DOB: [birth year], 60 y.o.   MRN: [id number]  HPI   Here for wellness and f/u;  Overall doing ok;  Pt denies CP, wheezing, orthopnea, PND, worsening LE edema, palpitations, dizziness or syncope.  Pt denies neurological change such as new headache, facial or extremity weakness.  Pt denies polydipsia, polyuria, or low sugar symptoms. Pt states overall good compliance with treatment and medications, good tolerability, and has been trying to follow appropriate diet.  Pt denies worsening depressive symptoms, suicidal ideation or panic. No fever, night sweats, wt loss, loss of appetite, or other constitutional symptoms.  Pt states good ability with ADL's, has low fall risk, home safety reviewed and adequate, no other significant changes in hearing or vision.   Also Here to f/u hx of mild to mod AS, used to run up to 5 miles per day from about 1994 to 2015,  noticed more difficulty with running and even going up steps in 2011,still can lift weights without too much dyspnea, but had to quit jogging about July 2015. Was seen about that time, echo ordered but not done.  Since July 2015 still walks occasionally up to 2 miles, but has increased sob/doe with any grade, seems especially worse for walking like this and stairs in the house later in the day.  Recalls also had a disability exam with peak flow meter but could not get to the minimum.  No recent cxr.  Last chest CT 2015 with significant pulm scarring, pancreatic mass/neuroendocrine tumor followed closely by Dr Norva Riffle, gets CT abd every 3 mo, with octreotide injections monthly.  Still with some mod growth seen. Feels somewhat sheepish today as he has no pain, but just cant function well, feels fatigued.  Does free wts one hour sessions in home gym several times per wk, has done this long time.    Also incidentally with chronic left nasal obstruction, night snoring, wife complains, has known dev nasal septum ,  asks for referral.  Also with bilat right > left "sudden" hearing loss in the past wk.   Denies urinary symptoms such as dysuria, urgency, flank pain, hematuria or n/v, fever, chills, but has nighttime nocturia x 5 which he just puts up with, but has seen prostate related ads on TV,asks for referral. Past Medical History  Diagnosis Date  . 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 Baker's cyst   . Hypertension   . Heart murmur   . Transfusion history     hx. 25 yrs ago-during cancer tx.  . 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 Lesion of left lung     hx.25 yrs ago- testicilar cancer related- only scarring left after tx.-no problems now  . Hypercholesteremia   . Pulmonary fibrosis ([place]) 11/08/2015   Past Surgical History  Procedure Laterality Date  . Thoracotomy  1990    wedge biopsy of mediastinal mass  . Back surgery      '14- rupt. disc   . Knee surgery      age 15 for Baker's cyst  . Eus N/A 02/10/2014    Procedure: UPPER ENDOSCOPIC ULTRASOUND (EUS) LINEAR;  Surgeon: [name], MD;  Location: [facility];  Service: Endoscopy;  Laterality: N/A;    reports that he has never smoked. He has never used smokeless tobacco. He reports that he drinks alcohol. He reports that  he does not use illicit drugs. family history includes Cancer in his maternal aunt and paternal grandfather; Dementia in his mother; Emphysema in his maternal aunt. Allergies  Allergen Reactions  . Penicillins Hives    Entire body.   Current Outpatient Prescriptions on File Prior to Visit  Medication Sig Dispense Refill  . acetaminophen-codeine (TYLENOL #3) 300-30 MG per tablet Take 1 tablet by mouth as needed.    . calcium carbonate (TUMS - DOSED IN MG ELEMENTAL CALCIUM) 500 MG chewable tablet Chew 3 tablets by mouth as needed for indigestion or heartburn.    . Coenzyme Q10 (COQ10) 100 MG CAPS Take 1 capsule by mouth daily.    .  Cyanocobalamin (VITAMIN B 12 PO) Take 1 tablet by mouth daily.    Marland Kitchen docusate sodium (COLACE) 100 MG capsule Take 100 mg by mouth 2 (two) times daily.    Marland Kitchen ibuprofen (ADVIL,MOTRIN) 200 MG tablet Take 600 mg by mouth every 6 (six) hours as needed for mild pain or moderate pain.     Marland Kitchen losartan (COZAAR) 50 MG tablet Take 1 tablet (50 mg total) by mouth every morning. 90 tablet 3  . losartan (COZAAR) 50 MG tablet TAKE 1 TABLET (50 MG TOTAL) BY MOUTH DAILY. 90 tablet 3  . Octreotide Acetate (SANDOSTATIN IJ) Inject as directed.    . ondansetron (ZOFRAN) 4 MG tablet Take 4 mg by mouth as needed.    Marland Kitchen oxyCODONE (OXY IR/ROXICODONE) 5 MG immediate release tablet Take 5 mg by mouth as needed.    . simvastatin (ZOCOR) 20 MG tablet Take 20 mg by mouth every evening.    . simvastatin (ZOCOR) 20 MG tablet TAKE 1 TABLET (20 MG TOTAL) BY MOUTH AT BEDTIME. 30 tablet 5  . simvastatin (ZOCOR) 20 MG tablet Take 1 tablet (20 mg total) by mouth at bedtime. 90 tablet 1  . tadalafil (CIALIS) 20 MG tablet Take 1 tablet (20 mg total) by mouth daily as needed. 5 tablet 11   No current facility-administered medications on file prior to visit.   Review of Systems Constitutional: Negative for increased diaphoresis, or other activity, appetite or siginficant weight change other than noted HENT: Negative for worsening hearing loss, ear pain, facial swelling, mouth sores and neck stiffness.   Eyes: Negative for other worsening pain, redness or visual disturbance.  Respiratory: Negative for choking or stridor Cardiovascular: Negative for other chest pain and palpitations.  Gastrointestinal: Negative for worsening diarrhea, blood in stool, or abdominal distention Genitourinary: Negative for hematuria, flank pain or change in urine volume.  Musculoskeletal: Negative for myalgias or other joint complaints.  Skin: Negative for other color change and wound or drainage.  Neurological: Negative for syncope and numbness. other than  noted Hematological: Negative for adenopathy. or other swelling Psychiatric/Behavioral: Negative for hallucinations, SI, self-injury, decreased concentration or other worsening agitation.      Objective:   Physical Exam BP 126/80 mmHg  Pulse 78  Resp 20  Wt 188 lb (85.276 kg)  SpO2 95% VS noted,  Constitutional: Pt is oriented to person, place, and time. Appears well-developed and well-nourished, in no significant distress Head: Normocephalic and atraumatic  Eyes: Conjunctivae and EOM are normal. Pupils are equal, round, and reactive to light Right Ear: External ear normal.  Left Ear: External ear normal Nose: Nose normal.  Bilat wax impactions noted to canals, cleared with irrigation, hearing improved Nose: with dev nasal septum, left relative obstruction Mouth/Throat: Oropharynx is clear and moist  Neck: Normal range of motion.  Neck supple. No JVD present. No tracheal deviation present or significant neck LA or mass Cardiovascular: Normal rate, regular rhythm, normal heart sounds and intact distal pulses.  but with loud 3/6 murmur RUSB Pulmonary/Chest: Effort normal and breath sounds without rales or wheezing except for left upper lung field faints dry rales Abdominal: Soft. Bowel sounds are normal. NT. No HSM  Musculoskeletal: Normal range of motion. Exhibits no edema Lymphadenopathy: Has no cervical adenopathy.  Neurological: Pt is alert and oriented to person, place, and time. Pt has normal reflexes. No cranial nerve deficit. Motor grossly intact Skin: Skin is warm and dry. No rash noted or new ulcers Psychiatric:  Has normal mood and affect. Behavior is normal.   ecg - nsr , no acute st or t wave changes    Assessment & Plan:

## 2015-11-08 NOTE — Progress Notes (Signed)
Pre visit review using our clinic review tool, if applicable. No additional management support is needed unless otherwise documented below in the visit note. 

## 2015-11-09 NOTE — Assessment & Plan Note (Addendum)
Has been mild to mod in past, possibly now more symtpomatic, for echo, cardiology referral   Note:  Total time for pt hx, exam, review of record with pt in the room, determination of diagnoses and plan for further eval and tx is > 40 min, with over 50% spent in coordination and counseling of patient

## 2015-11-09 NOTE — Assessment & Plan Note (Signed)
Noted on recent imaging, now possibly more symptomatic, for PFT's, consider pulm referral

## 2015-11-09 NOTE — Assessment & Plan Note (Signed)
Etiology unclear as to how much pulm vs cardiac vs other- for cxr today , o/w cont same med tx

## 2015-11-09 NOTE — Assessment & Plan Note (Signed)
Suspect liekly related to the nasal septal deviation, cant r/o other, pt asks for referral ENT

## 2015-11-09 NOTE — Assessment & Plan Note (Signed)

## 2015-11-09 NOTE — Assessment & Plan Note (Signed)
Has possible need for surgical correction, for ENT referral per pt request

## 2015-11-09 NOTE — Assessment & Plan Note (Signed)
[facility] for cont'd ED med such as PDE5's

## 2015-11-09 NOTE — Assessment & Plan Note (Signed)
Improved s/p bilat wax impaction irrigations,  to f/u any worsening symptoms or concerns

## 2015-11-09 NOTE — Assessment & Plan Note (Signed)
Etiology unclear, Mild to mod, for urology referral,  to f/u any worsening symptoms or concerns

## 2015-11-13 ENCOUNTER — Encounter: Payer: Self-pay | Admitting: Internal Medicine

## 2015-11-29 ENCOUNTER — Telehealth: Payer: Self-pay | Admitting: *Deleted

## 2015-11-29 NOTE — Telephone Encounter (Signed)
Call from pt stating he did not get scheduled for lab or scans before this visit. According to office note, plan was for office visit only. Confirmed with Dr. [name]: No scans needed for this visit. Pt voiced understanding, he will come in as scheduled 4/28.

## 2015-12-01 ENCOUNTER — Ambulatory Visit (HOSPITAL_BASED_OUTPATIENT_CLINIC_OR_DEPARTMENT_OTHER): Payer: BLUE CROSS/BLUE SHIELD | Admitting: Oncology

## 2015-12-01 ENCOUNTER — Telehealth: Payer: Self-pay | Admitting: Oncology

## 2015-12-01 ENCOUNTER — Ambulatory Visit (HOSPITAL_BASED_OUTPATIENT_CLINIC_OR_DEPARTMENT_OTHER): Payer: BLUE CROSS/BLUE SHIELD

## 2015-12-01 VITALS — BP 133/65 | HR 86 | Temp 98.4°F | Resp 18 | Ht 70.0 in | Wt 184.8 lb

## 2015-12-01 DIAGNOSIS — C7B8 Other secondary neuroendocrine tumors: Secondary | ICD-10-CM

## 2015-12-01 DIAGNOSIS — C7A8 Other malignant neuroendocrine tumors: Secondary | ICD-10-CM

## 2015-12-01 DIAGNOSIS — R0602 Shortness of breath: Secondary | ICD-10-CM

## 2015-12-01 DIAGNOSIS — D3A8 Other benign neuroendocrine tumors: Secondary | ICD-10-CM

## 2015-12-01 DIAGNOSIS — I350 Nonrheumatic aortic (valve) stenosis: Secondary | ICD-10-CM

## 2015-12-01 MED ORDER — OCTREOTIDE ACETATE 30 MG IM KIT
30.0000 mg | PACK | Freq: Once | INTRAMUSCULAR | Status: AC
  Administered 2015-12-01: 30 mg via INTRAMUSCULAR
  Filled 2015-12-01: qty 1

## 2015-12-01 NOTE — Progress Notes (Signed)
  [facility] OFFICE PROGRESS NOTE   Diagnosis: Pancreatic neuroendocrine tumor  INTERVAL HISTORY:   Ronald Thompson returns as scheduled. He reports exertional dyspnea. He has been referred to cardiology and for pulmonary function studies. He is planning to see urology to evaluate urinary frequency and erectile dysfunction. He exercises regularly, but has dyspnea when climbing [facility]. He continues monthly Sandostatin.   Objective:  Vital signs in last 24 hours:  Blood pressure 133/65, pulse 86, temperature 98.4 F (36.9 C), temperature source Oral, resp. rate 18, height 5\' 10"  (1.778 m), weight 184 lb 12.8 oz (83.825 kg), SpO2 99 %.    HEENT:  Neck without mass Resp:  Decreased breath sounds at the left compared to the right chest, no respiratory distress at rest Cardio:  Regular rate and rhythm, 2/6 systolic murmur GI:  No hepatomegaly, nontender Vascular:  No leg edema   Lab Results:  Lab Results  Component Value Date   WBC 8.2 11/08/2015   HGB 15.9 11/08/2015   HCT 47.0 11/08/2015   MCV 92.5 11/08/2015   PLT 190.0 11/08/2015   NEUTROABS 6.5 11/08/2015    Medications: I have reviewed the patient's current medications.  Assessment/Plan: 1. Pancreatic neuroendocrine tumor, WHO grade 2, pancreatic head mass and small peripancreatic/celiac nodes on a CT 02/04/2014  EUS revealed evidence of multiple liver metastases-status post an FNA biopsy of a left liver lesion 02/10/2014 confirming a neuroendocrine tumor   Octreotide scan 04/01/2014 with multiple foci of metastatic neuroendocrine tumor in the liver  Monthly Sandostatin started 03/16/2014  Restaging CT 07/04/2014 with resolution of a previously noted pancreas head lesion and probable progression of liver metastases  Restaging CT 10/31/2014-stable liver lesions, no evidence of a pancreas mass, stable.  Restaging CT 02/28/2015-stable hepatic metastases, stable pancreas lesion unchanged  Restaging CT  08/30/2015-stable pancreas mass, slight enlargement of hepatic metastases  Monthly Sandostatin continued  2. Chest Seminoma in 1990 treated with BEP and chest radiation at [facility] 3. 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 chronic exertional dyspnea following treatment for the seminoma  5. aortic stenosis on an echocardiogram December 2011  6. lumbar disc surgery 2014  7. acute abdominal pain 02/04/2014-resolved     Disposition:  There is no clinical evidence for progression of the neuroendocrine tumor. He will continue monthly Sandostatin. We are awaiting approval of the [id number] agent to treat patients with metastatic carcinoid and neuroendocrine tumors.  The dyspnea is likely related to chronic pulmonary disease from chest radiation. He has been referred to cardiology to evaluate the aortic stenosis.  Ronald Thompson will return for an office visit and restaging CT evaluation in 3 months.  Ronald Coder, MD  12/01/2015  10:53 AM

## 2015-12-01 NOTE — Telephone Encounter (Signed)
per pof to sch pt appt-gave pt copy of avs °

## 2015-12-06 ENCOUNTER — Other Ambulatory Visit: Payer: Self-pay

## 2015-12-06 ENCOUNTER — Ambulatory Visit (HOSPITAL_COMMUNITY): Payer: BLUE CROSS/BLUE SHIELD | Attending: Internal Medicine

## 2015-12-06 DIAGNOSIS — I350 Nonrheumatic aortic (valve) stenosis: Secondary | ICD-10-CM | POA: Diagnosis not present

## 2015-12-06 DIAGNOSIS — I352 Nonrheumatic aortic (valve) stenosis with insufficiency: Secondary | ICD-10-CM | POA: Diagnosis not present

## 2015-12-06 DIAGNOSIS — I340 Nonrheumatic mitral (valve) insufficiency: Secondary | ICD-10-CM | POA: Insufficient documentation

## 2015-12-06 DIAGNOSIS — E7800 Pure hypercholesterolemia, unspecified: Secondary | ICD-10-CM | POA: Insufficient documentation

## 2015-12-06 DIAGNOSIS — I119 Hypertensive heart disease without heart failure: Secondary | ICD-10-CM | POA: Diagnosis not present

## 2015-12-06 DIAGNOSIS — R0600 Dyspnea, unspecified: Secondary | ICD-10-CM | POA: Insufficient documentation

## 2015-12-06 DIAGNOSIS — I071 Rheumatic tricuspid insufficiency: Secondary | ICD-10-CM | POA: Insufficient documentation

## 2015-12-07 ENCOUNTER — Encounter: Payer: Self-pay | Admitting: Internal Medicine

## 2015-12-18 ENCOUNTER — Ambulatory Visit: Payer: BLUE CROSS/BLUE SHIELD | Admitting: Physician Assistant

## 2015-12-18 ENCOUNTER — Encounter: Payer: Self-pay | Admitting: Medical

## 2015-12-18 ENCOUNTER — Ambulatory Visit (INDEPENDENT_AMBULATORY_CARE_PROVIDER_SITE_OTHER): Payer: BLUE CROSS/BLUE SHIELD | Admitting: Medical

## 2015-12-18 VITALS — BP 138/70 | HR 86 | Temp 98.3°F | Ht 70.0 in | Wt 188.2 lb

## 2015-12-18 DIAGNOSIS — J209 Acute bronchitis, unspecified: Secondary | ICD-10-CM

## 2015-12-18 MED ORDER — AZITHROMYCIN 250 MG PO TABS: ORAL_TABLET | ORAL | Status: DC

## 2015-12-18 NOTE — Progress Notes (Signed)
Subjective:    Patient ID: Ronald Thompson, male    DOB: 09/03/1955, 60 y.o.   MRN: [id number]  HPI  Pt came on with gradual chest congestion with gradual worsening cough. Cough is getting worse. Little more at night. Some wheezing. Hx of seminoma in his chest and scarring in his chest 1990. Every since then will get occasional wheezing with bronchytis.  No fevers or chills.  Some nasal  Congestion.  Pt states usually get zpack to deal with infection.     Review of Systems  Constitutional: Negative for fever, chills and fatigue.  HENT: Positive for congestion. Negative for ear discharge, mouth sores, nosebleeds, rhinorrhea, sinus pressure, sneezing and sore throat.   Respiratory: Positive for cough. Negative for chest tightness, shortness of breath and wheezing.   Cardiovascular: Negative for chest pain and palpitations.  Musculoskeletal: Negative for back pain.  Skin: Negative for rash.  Neurological: Negative for dizziness and headaches.  Hematological: Negative for adenopathy. Does not bruise/bleed easily.  Psychiatric/Behavioral: Negative for behavioral problems and confusion.     Past Medical History  Diagnosis Date  . 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 Baker's cyst   . Hypertension   . Heart murmur   . Transfusion history     hx. 25 yrs ago-during cancer tx.  . 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 Lesion of left lung     hx.25 yrs ago- testicilar cancer related- only scarring left after tx.-no problems now  . Hypercholesteremia   . Pulmonary fibrosis ([place]) 11/08/2015     Social History   Social History  . Marital Status: Married    Spouse Name: N/A  . Number of Children: 3  . Years of Education: 16   Occupational History  . Banker    Social History Main Topics  . Smoking status: Never Smoker   . Smokeless tobacco: Never Used  . Alcohol Use: Yes     Comment: rare- social  . Drug  Use: No  . Sexual Activity:    Partners: Female   Other Topics Concern  . Not on file   Social History Narrative   [name]. 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 2 sons: 1979, 1985- married with 2 daughters 1 in route: Oldest in [place], younger [place], [place]. 1 daughter: 80 [place], married-1 granddaughter.Customer service manager- Nurse, mental health. SO- good health; marriage in good health   Enjoys watching movies, works out at home gym    Past Surgical History  Procedure Laterality Date  . Thoracotomy  1990    wedge biopsy of mediastinal mass  . Back surgery      '14- rupt. disc   . Knee surgery      age 55 for Baker's cyst  . Eus N/A 02/10/2014    Procedure: UPPER ENDOSCOPIC ULTRASOUND (EUS) LINEAR;  Surgeon: [name], MD;  Location: [facility];  Service: Endoscopy;  Laterality: N/A;    Family History  Problem Relation Age of Onset  . Dementia Mother   . Cancer Maternal Aunt     colon  . Emphysema Maternal Aunt   . Cancer Paternal Grandfather     esophagus    Allergies  Allergen Reactions  . Penicillins Hives    Entire body.    Current Outpatient Prescriptions on File Prior to Visit  Medication Sig Dispense Refill  . calcium carbonate (TUMS - DOSED IN MG ELEMENTAL CALCIUM) 500 MG chewable tablet  Chew 3 tablets by mouth as needed for indigestion or heartburn.    . Coenzyme Q10 (COQ10) 100 MG CAPS Take 1 capsule by mouth daily.    . Cyanocobalamin (VITAMIN B 12 PO) Take 1 tablet by mouth daily.    Marland Kitchen ibuprofen (ADVIL,MOTRIN) 200 MG tablet Take 600 mg by mouth every 6 (six) hours as needed for mild pain or moderate pain.     Marland Kitchen losartan (COZAAR) 50 MG tablet Take 1 tablet (50 mg total) by mouth every morning. 90 tablet 3  . Octreotide Acetate (SANDOSTATIN IJ) Inject as directed.    . simvastatin (ZOCOR) 20 MG tablet TAKE 1 TABLET (20 MG TOTAL) BY MOUTH AT BEDTIME. 30 tablet 5   No current facility-administered medications on file prior to visit.    BP 138/70 mmHg  Pulse 86  Temp(Src) 98.3  F (36.8 C) (Oral)  Ht 5\' 10"  (1.778 m)  Wt 188 lb 3.2 oz (85.367 kg)  BMI 27.00 kg/m2  SpO2 96%       Objective:   Physical Exam   General  Mental Status - Alert. General Appearance - Well groomed. Not in acute distress.  Skin Rashes- No Rashes.  HEENT Head- Normal. Ear Auditory Canal - Left- Normal. Right - Normal.Tympanic Membrane- Left- Normal. Right- Normal. Eye Sclera/Conjunctiva- Left- Normal. Right- Normal. Nose & Sinuses Nasal Mucosa- Left-  Boggy and Congested. Right-  Boggy and  Congested.Bilateral no  maxillary and no  frontal sinus pressure. Mouth & Throat Lips: Upper Lip- Normal: no dryness, cracking, pallor, cyanosis, or vesicular eruption. Lower Lip-Normal: no dryness, cracking, pallor, cyanosis or vesicular eruption. Buccal Mucosa- Bilateral- No Aphthous ulcers. Oropharynx- No Discharge or Erythema.   +pnd Tonsils: Characteristics- Bilateral- No Erythema or Congestion. Size/Enlargement- Bilateral- No enlargement. Discharge- bilateral-None.  Neck Neck- Supple. No Masses.  Chest and Lung Exam Auscultation: Breath Sounds:-Clear even and unlabored.  Cardiovascular Auscultation:Rythm- Regular, rate and rhythm. Murmurs & Other Heart Sounds:Ausculatation of the heart reveal- No Murmurs.  Lymphatic Head & Neck General Head & Neck Lymphatics: Bilateral: Description- No Localized lymphadenopathy.      Assessment & Plan:  By your history and recent presentation likely early bronchitis. Will rx azithromycin.  If you get wheezing can call in inhalers. Also if cough worsens can call in cough tabs. Offered both inhaler and cough med today but pt declined.(so advised if he calls [place] later could call those meds in)  Follow up in 7 days or as needed

## 2015-12-18 NOTE — Patient Instructions (Signed)
By your history and recent presentation likely early bronchitis. Will rx azithromycin.  If you get wheezing can call in inhalers. Also if cough worsens can call in cough tabs.  Follow up in 7 days or as needed

## 2015-12-27 ENCOUNTER — Ambulatory Visit (INDEPENDENT_AMBULATORY_CARE_PROVIDER_SITE_OTHER): Payer: BLUE CROSS/BLUE SHIELD | Admitting: Internal Medicine

## 2015-12-27 DIAGNOSIS — R0600 Dyspnea, unspecified: Secondary | ICD-10-CM

## 2015-12-27 LAB — PULMONARY FUNCTION TEST
DL/VA % pred: 102 %
DL/VA: 4.76 ml/min/mmHg/L
DLCO COR % PRED: 65 %
DLCO COR: 21.16 ml/min/mmHg
DLCO unc % pred: 66 %
DLCO unc: 21.62 ml/min/mmHg
FEF 25-75 PRE: 1.47 L/s
FEF 25-75 Post: 1.86 L/sec
FEF2575-%Change-Post: 27 %
FEF2575-%PRED-PRE: 51 %
FEF2575-%Pred-Post: 64 %
FEV1-%CHANGE-POST: 6 %
FEV1-%PRED-POST: 55 %
FEV1-%PRED-PRE: 51 %
FEV1-POST: 1.95 L
FEV1-PRE: 1.83 L
FEV1FVC-%CHANGE-POST: 3 %
FEV1FVC-%PRED-PRE: 99 %
FEV6-%Change-Post: 2 %
FEV6-%PRED-PRE: 54 %
FEV6-%Pred-Post: 55 %
FEV6-POST: 2.5 L
FEV6-Pre: 2.43 L
FEV6FVC-%Change-Post: 0 %
FEV6FVC-%PRED-POST: 104 %
FEV6FVC-%Pred-Pre: 104 %
FVC-%CHANGE-POST: 2 %
FVC-%PRED-PRE: 52 %
FVC-%Pred-Post: 53 %
FVC-PRE: 2.45 L
FVC-Post: 2.51 L
POST FEV6/FVC RATIO: 99 %
PRE FEV1/FVC RATIO: 75 %
Post FEV1/FVC ratio: 78 %
Pre FEV6/FVC Ratio: 99 %

## 2015-12-27 NOTE — Progress Notes (Signed)
PFT performed today. 

## 2015-12-29 ENCOUNTER — Ambulatory Visit (HOSPITAL_BASED_OUTPATIENT_CLINIC_OR_DEPARTMENT_OTHER): Payer: BLUE CROSS/BLUE SHIELD

## 2015-12-29 VITALS — BP 124/64 | HR 83 | Temp 98.4°F | Resp 18

## 2015-12-29 DIAGNOSIS — C7B8 Other secondary neuroendocrine tumors: Secondary | ICD-10-CM

## 2015-12-29 DIAGNOSIS — C7A8 Other malignant neuroendocrine tumors: Secondary | ICD-10-CM | POA: Diagnosis not present

## 2015-12-29 DIAGNOSIS — D3A8 Other benign neuroendocrine tumors: Secondary | ICD-10-CM

## 2015-12-29 MED ORDER — OCTREOTIDE ACETATE 30 MG IM KIT
30.0000 mg | PACK | Freq: Once | INTRAMUSCULAR | Status: AC
  Administered 2015-12-29: 30 mg via INTRAMUSCULAR
  Filled 2015-12-29: qty 1

## 2015-12-29 NOTE — Patient Instructions (Signed)

## 2016-01-10 ENCOUNTER — Encounter: Payer: Self-pay | Admitting: Cardiovascular Disease

## 2016-01-10 ENCOUNTER — Ambulatory Visit (INDEPENDENT_AMBULATORY_CARE_PROVIDER_SITE_OTHER): Payer: BLUE CROSS/BLUE SHIELD | Admitting: Cardiovascular Disease

## 2016-01-10 VITALS — BP 112/82 | HR 64 | Ht 70.0 in | Wt 187.8 lb

## 2016-01-10 DIAGNOSIS — I350 Nonrheumatic aortic (valve) stenosis: Secondary | ICD-10-CM | POA: Diagnosis not present

## 2016-01-10 DIAGNOSIS — J8410 Pulmonary fibrosis, unspecified: Secondary | ICD-10-CM | POA: Diagnosis not present

## 2016-01-10 NOTE — Patient Instructions (Signed)
Medication Instructions:  Your physician recommends that you continue on your current medications as directed. Please refer to the Current Medication list given to you today.   Labwork: None ordered  Testing/Procedures: None ordered  Follow-Up: Your physician recommends that you schedule a follow-up appointment in: 3 months with Dr.Nahser (04/18/16 @ 8:30am)  You have been referred to Dr.Cooper for a TAVR consult (01/12/16 @ 11:30am)    Any Other Special Instructions Will Be Listed Below (If Applicable).     If you need a refill on your cardiac medications before your next appointment, please call your pharmacy.

## 2016-01-10 NOTE — Progress Notes (Signed)
Cardiology Office Note   Date:  01/10/2016   ID:  Ronald Thompson, DOB [birth year], MRN [id number]  PCP:  Cathlean Cower, MD  Cardiologist:   Mertie Moores, MD   Chief Complaint  Patient presents with  . Heart Murmur   Problem list 1. Aortic stenosis - likely had a bicuspid AV  2. Essential hypertension 3. Permanent fibrosis 4. Hyperlipidemia 5. Metastatic seminoma - to his chest ,  S/p chemo and XRT  Took VP 16 - caused some pulmonary fibrosis  6. Neuroendocrine tumur - Pancreatic cancer with mets to Liver .    History of Present Illness: Ronald Thompson is a 60 y.o. male who presents for evaluation of his aortic stenosis. He has a history of metastatic seminoma up to his chest. He received high-dose chemotherapy and XRT .   It also accompanied some of his heart and extended down to the diaphragm.  2 years ago he was diagnosed with a neuroendocrine tumor-likely to be pancreatic cancer.  Gets saldostatin monthly .  He has some metastases to his liver.   He sees [name]  Has been told that there is no cure, but the growth rate has been slowed dramaticlly .    Has had dyspnea for the past 25 years- since the seminoma was treated .  Has significant DOE but he is able to lift weights.  Does have DOE with walking up hills or climbing stairs.  Does ok on level ground .   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 Baker's cyst   . Hypertension   . Heart murmur   . Transfusion history     hx. 25 yrs ago-during cancer tx.  . Testicular seminoma ([place]) 1990    with metatstatic spread - good response to therapy-radiation and chemotherapy  . Lesion of left lung     hx.25 yrs ago- testicilar cancer related- only scarring left after tx.-no problems now  . Hypercholesteremia   . Pulmonary fibrosis ([place]) 11/08/2015    Past Surgical History  Procedure Laterality Date  . Thoracotomy  1990    wedge  biopsy of mediastinal mass  . Back surgery      '14- rupt. disc   . Knee surgery      age 77 for Baker's cyst  . Eus N/A 02/10/2014    Procedure: UPPER ENDOSCOPIC ULTRASOUND (EUS) LINEAR;  Surgeon: [name], MD;  Location: [facility];  Service: Endoscopy;  Laterality: N/A;     Current Outpatient Prescriptions  Medication Sig Dispense Refill  . calcium carbonate (TUMS - DOSED IN MG ELEMENTAL CALCIUM) 500 MG chewable tablet Chew 3 tablets by mouth as needed for indigestion or heartburn.    Marland Kitchen ibuprofen (ADVIL,MOTRIN) 200 MG tablet Take 600 mg by mouth every 6 (six) hours as needed for mild pain or moderate pain.     Marland Kitchen losartan (COZAAR) 50 MG tablet Take 1 tablet (50 mg total) by mouth every morning. 90 tablet 3  . Octreotide Acetate (SANDOSTATIN IJ) Inject as directed.    . simvastatin (ZOCOR) 20 MG tablet TAKE 1 TABLET (20 MG TOTAL) BY MOUTH AT BEDTIME. 30 tablet 5   No current facility-administered medications for this visit.    Allergies:   Penicillins    Social History:  The patient  reports that he has never smoked. He has never used smokeless tobacco. He  reports that he drinks alcohol. He reports that he does not use illicit drugs.   Family History:  The patient's family history includes Cancer in his maternal aunt and paternal grandfather; Dementia in his mother; Emphysema in his maternal aunt.    ROS:  Please see the history of present illness.    Review of Systems: Constitutional:  denies fever, chills, diaphoresis, appetite change and fatigue.  HEENT: denies photophobia, eye pain, redness, hearing loss, ear pain, congestion, sore throat, rhinorrhea, sneezing, neck pain, neck stiffness and tinnitus.  Respiratory: denies SOB, DOE, cough, chest tightness, and wheezing.  Cardiovascular: denies chest pain, palpitations and leg swelling.  Gastrointestinal: denies nausea, vomiting, abdominal pain, diarrhea, constipation, blood in stool.  Genitourinary: denies dysuria,  urgency, frequency, hematuria, flank pain and difficulty urinating.  Musculoskeletal: denies  myalgias, back pain, joint swelling, arthralgias and gait problem.   Skin: denies pallor, rash and wound.  Neurological: denies dizziness, seizures, syncope, weakness, light-headedness, numbness and headaches.   Hematological: denies adenopathy, easy bruising, personal or family bleeding history.  Psychiatric/ Behavioral: denies suicidal ideation, mood changes, confusion, nervousness, sleep disturbance and agitation.       All other systems are reviewed and negative.    PHYSICAL EXAM: VS:  BP 112/82 mmHg  Pulse 64  Ht 5\' 10"  (1.778 m)  Wt 187 lb 12.8 oz (85.186 kg)  BMI 26.95 kg/m2 , BMI Body mass index is 26.95 kg/(m^2). GEN: Well nourished, well developed, in no acute distress HEENT: normal Neck: no JVD, carotid bruits, or masses Cardiac: RRR;  [id number] systolic  murmur, rubs, or gallops,no edema  Respiratory:  clear to auscultation bilaterally, normal work of breathing GI: soft, nontender, nondistended, + BS MS: no deformity or atrophy Skin: warm and dry, no rash Neuro:  Strength and sensation are intact Psych: normal   EKG:  EKG is not ordered today. The ekg ordered today demonstrates    Recent Labs: 11/08/2015: ALT 28; BUN 21; Creatinine, Ser 0.97; Hemoglobin 15.9; Platelets 190.0; Potassium 4.5; Sodium 139; TSH 1.65    Lipid Panel    Component Value Date/Time   CHOL 158 11/08/2015 1459   TRIG 75.0 11/08/2015 1459   HDL 51.50 11/08/2015 1459   CHOLHDL 3 11/08/2015 1459   VLDL 15.0 11/08/2015 1459   LDLCALC 92 11/08/2015 1459   LDLDIRECT 139.2 10/10/2008 0800      Wt Readings from Last 3 Encounters:  01/10/16 187 lb 12.8 oz (85.186 kg)  12/18/15 188 lb 3.2 oz (85.367 kg)  12/01/15 184 lb 12.8 oz (83.825 kg)      Other studies Reviewed: Additional studies/ records that were reviewed today include: . Review of the above records demonstrates:    ASSESSMENT AND  PLAN:  1.  Aortic stenosis: And presents today for further evaluation and discussion of his aortic stenosis. He likely has a bicuspid aortic valve. He's had a heart murmur all of his life. He has been relatively asymptomatic until 2 years ago. He's had a loud heart murmur and recent echocardiogram reveals severe aortic stenosis with a mean aortic valve gradient of 39 mmHg.  Complicating this issue is that he has had a metastatic seminoma up to his chest. He received radiation therapy and chemotherapy.   In addition, he now has been diagnosed with a neuroendocrine tumor-likely pancreatic cancer.  He is currently taking Saldostatin and the tumor seems to be stable. He thinks that he probably has 5-7 years to live.  I think that our best option would be to  consider TAVR.  Because of his existing neuroendocrine tumor and his history of radiation to the chest I think that standard aortic valve replacement would be too risky. In addition, he does not want to spend a long time recovering from aortic valve surgery if he only has a few years left to live .   We'll have him see Dr. Burt Knack on Friday for further discussion and evaluation and to see if he is a candidate for TAVR.   I'll see him in 3 months.   Current medicines are reviewed at length with the patient today.  The patient does not have concerns regarding medicines.  The following changes have been made:  no change  Labs/ tests ordered today include:  No orders of the defined types were placed in this encounter.     Disposition:   FU with me in 3 months      Mertie Moores, MD  01/10/2016 9:22 AM    [facility] [place], [place], [place]  [address] Phone: [phone]; Fax: [phone]   [facility]  [address] [place], [place]  [address] [phone]   Fax [phone]

## 2016-01-12 ENCOUNTER — Ambulatory Visit (INDEPENDENT_AMBULATORY_CARE_PROVIDER_SITE_OTHER): Payer: BLUE CROSS/BLUE SHIELD | Admitting: Cardiovascular Disease

## 2016-01-12 ENCOUNTER — Encounter: Payer: Self-pay | Admitting: Cardiovascular Disease

## 2016-01-12 VITALS — BP 126/70 | HR 72 | Ht 70.0 in | Wt 186.8 lb

## 2016-01-12 DIAGNOSIS — I350 Nonrheumatic aortic (valve) stenosis: Secondary | ICD-10-CM

## 2016-01-12 LAB — CBC
HCT: 45.4 % (ref 38.5–50.0)
HEMOGLOBIN: 15.7 g/dL (ref 13.2–17.1)
MCH: 31.6 pg (ref 27.0–33.0)
MCHC: 34.6 g/dL (ref 32.0–36.0)
MCV: 91.3 fL (ref 80.0–100.0)
MPV: 9.6 fL (ref 7.5–12.5)
PLATELETS: 196 10*3/uL (ref 140–400)
RBC: 4.97 MIL/uL (ref 4.20–5.80)
RDW: 13.4 % (ref 11.0–15.0)
WBC: 6.2 10*3/uL (ref 3.8–10.8)

## 2016-01-12 LAB — BASIC METABOLIC PANEL
BUN: 25 mg/dL (ref 7–25)
CALCIUM: 9.1 mg/dL (ref 8.6–10.3)
CO2: 29 mmol/L (ref 20–31)
CREATININE: 1.09 mg/dL (ref 0.70–1.25)
Chloride: 99 mmol/L (ref 98–110)
Glucose, Bld: 93 mg/dL (ref 65–99)
Potassium: 4.6 mmol/L (ref 3.5–5.3)
SODIUM: 138 mmol/L (ref 135–146)

## 2016-01-12 LAB — PROTIME-INR
INR: 1.04 (ref <1.50)
PROTHROMBIN TIME: 13.7 s (ref 11.6–15.2)

## 2016-01-12 MED ORDER — ASPIRIN EC 81 MG PO TBEC: 81.0000 mg | DELAYED_RELEASE_TABLET | Freq: Every day | ORAL | Status: DC

## 2016-01-12 NOTE — Patient Instructions (Addendum)
Medication Instructions:  Your physician has recommended you make the following change in your medication:  1. START Aspirin 81mg  take one tablet by mouth daily  Labwork: Your physician recommends that you have lab work today: BMP, CBC and PT/INR  Testing/Procedures: Your physician has requested that you have a cardiac catheterization. Cardiac catheterization is used to diagnose and/or treat various heart conditions. Doctors may recommend this procedure for a number of different reasons. The most common reason is to evaluate chest pain. Chest pain can be a symptom of coronary artery disease (CAD), and cardiac catheterization can show whether plaque is narrowing or blocking your heart's arteries. This procedure is also used to evaluate the valves, as well as measure the blood flow and oxygen levels in different parts of your heart. For further information please visit [email or web address]. Please follow instruction sheet, as given.  Your physician has requested that you have cardiac CT. Cardiac computed tomography (CT) is a painless test that uses an x-ray machine to take clear, detailed pictures of your heart. For further information please visit [email or web address][email or web address].   Non-Cardiac CT Angiography (CTA), is a special type of CT scan that uses a computer to produce multi-dimensional views of major blood vessels throughout the body. In CT angiography, a contrast material is injected through an IV to help visualize the blood vessels (chest/abdomen and pelvis)  Please arrive in Admitting at [facility] on Wednesday January 17, 2016 at 8:45 AM    .   No solid foods, caffeine or smoking 4 hours prior to CT. No herbal supplements 24 hours prior to test. No sexual enhancement drugs/herbs 72 hours prior to test.   Follow-Up: We will arrange further evaluation after further testing.   Any Other Special Instructions Will Be Listed Below (If Applicable).     If you need a refill on your cardiac  medications before your next appointment, please call your pharmacy.

## 2016-01-12 NOTE — Progress Notes (Signed)
Cardiology Office Note Date:  01/12/2016   ID:  Ronald Thompson, DOB [birth year], MRN [id number]  PCP:  Ronald Cower, MD  Cardiologist:  Ronald Mocha, MD    Chief Complaint  Patient presents with  . Shortness of Breath    History of Present Illness: Ronald Thompson is a 60 y.o. male who presents for evaluation of severe aortic stenosis.   In 1990 he was diagnosed with seminoma in his chest complicated by phrenic nerve and cardiac compression. He had problems with chest pain around the same time and was treated for recurrent pericarditis. 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 He then was able to get back to fairly normal activities with some mild limitation from shortness of breath. However, over the last few years he has developed progressive shortness of breath with activity. He now is dyspneic with one flight of stairs especially late in the day. He's had a longstanding heart murmur and has been followed for aortic stenosis. A recent echo done for evaluation of shortness of breath showed progressive aortic stenosis, now in the severe range with a mean gradient of 39 mmHg.   Symptoms are worse as the day goes on. He also complains of a feeling of tightness in his chest with heavy exertion. No lightheadedness or syncope. No orthopnea or PND.   He was diagnosed with neuroendocrine type pancreatic cancer in 2015 and he is followed closely by Dr Ronald Thompson. His tumor has metastasized to his liver and it's progression has stabilized on sandostatin.   The patient is here with his wife today. He has 3 children, 2 in [name] and one in [place]. They have 7 grandchildren. He's active and is primarily limited with aerobic activity. He is able to walk on level ground but has to stop and rest with an incline. He has not had orthopnea, PND, or other symptoms of heart failure. He reports a good appetite.  Past Medical History    Diagnosis Date  . 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 Baker's cyst   . Hypertension   . Heart murmur   . Transfusion history     hx. 25 yrs ago-during cancer tx.  . Testicular seminoma ([place]) 1990    with metatstatic spread - good response to therapy-radiation and chemotherapy  . Lesion of left lung     hx.25 yrs ago- testicilar cancer related- only scarring left after tx.-no problems now  . Hypercholesteremia   . Pulmonary fibrosis ([place]) 11/08/2015    Past Surgical History  Procedure Laterality Date  . Thoracotomy  1990    wedge biopsy of mediastinal mass  . Back surgery      '14- rupt. disc   . Knee surgery      age 71 for Baker's cyst  . Eus N/A 02/10/2014    Procedure: UPPER ENDOSCOPIC ULTRASOUND (EUS) LINEAR;  Surgeon: Ronald Banister, MD;  Location: [facility];  Service: Endoscopy;  Laterality: N/A;    Current Outpatient Prescriptions  Medication Sig Dispense Refill  . calcium carbonate (TUMS - DOSED IN MG ELEMENTAL CALCIUM) 500 MG chewable tablet Chew 3 tablets by mouth as needed for indigestion or heartburn.    Marland Kitchen ibuprofen (ADVIL,MOTRIN) 200 MG tablet Take 600 mg by mouth every 6 (six) hours as needed for mild pain or moderate pain.     Marland Kitchen losartan (COZAAR) 50 MG tablet Take 1 tablet (50 mg total) by  mouth every morning. 90 tablet 3  . Octreotide Acetate (SANDOSTATIN IJ) Inject as directed.    . simvastatin (ZOCOR) 20 MG tablet TAKE 1 TABLET (20 MG TOTAL) BY MOUTH AT BEDTIME. 30 tablet 5  . aspirin EC 81 MG tablet Take 1 tablet (81 mg total) by mouth daily. 90 tablet 3   No current facility-administered medications for this visit.    Allergies:   Penicillins   Social History:  The patient  reports that he has never smoked. He has never used smokeless tobacco. He reports that he drinks alcohol. He reports that he does not use illicit drugs.   Family History:  The patient's family history includes Cancer in his maternal aunt and paternal grandfather; Dementia in his  mother; Emphysema in his maternal aunt.   ROS:  Please see the history of present illness.  All other systems are reviewed and negative.   PHYSICAL EXAM: VS:  BP 126/70 mmHg  Pulse 72  Ht 5\' 10"  (1.778 m)  Wt 186 lb 12.8 oz (84.732 kg)  BMI 26.80 kg/m2 , BMI Body mass index is 26.8 kg/(m^2). GEN: Well nourished, well developed, in no acute distress HEENT: normal Neck: no JVD, no masses. bilateral carotid bruits Cardiac: RRR 3/6 harsh crescendo decrescendo murmur at the right upper sternal border and 2/6 early diastolic decrescendo murmur at the right upper sternal border         Respiratory:  clear to auscultation bilaterally, normal work of breathing GI: soft, nontender, nondistended, + BS MS: no deformity or atrophy Ext: no pretibial edema, pedal pulses 2+= bilaterally Skin: warm and dry, no rash Neuro:  Strength and sensation are intact Psych: euthymic mood, full affect  EKG:  EKG is not ordered today.  Recent Labs: 11/08/2015: ALT 28; BUN 21; Creatinine, Ser 0.97; Hemoglobin 15.9; Platelets 190.0; Potassium 4.5; Sodium 139; TSH 1.65   Lipid Panel     Component Value Date/Time   CHOL 158 11/08/2015 1459   TRIG 75.0 11/08/2015 1459   HDL 51.50 11/08/2015 1459   CHOLHDL 3 11/08/2015 1459   VLDL 15.0 11/08/2015 1459   LDLCALC 92 11/08/2015 1459   LDLDIRECT 139.2 10/10/2008 0800      Wt Readings from Last 3 Encounters:  01/12/16 186 lb 12.8 oz (84.732 kg)  01/10/16 187 lb 12.8 oz (85.186 kg)  12/18/15 188 lb 3.2 oz (85.367 kg)     Cardiac Studies Reviewed: 2D Echo 12/06/2015: Left ventricle: The cavity size was normal. Wall thickness was increased in a pattern of mild LVH. Systolic function was normal. The estimated ejection fraction was in the range of 60% to 65%. Doppler parameters are consistent with abnormal left ventricular relaxation (grade 1 diastolic dysfunction). The E/e&' ratio is >15, suggesting elevated LV filling  pressure.  ------------------------------------------------------------------- Aortic valve: Heavily calcified aortic valve with severe aortic stenosis. Doppler: There was mild regurgitation. VTI ratio of LVOT to aortic valve: 0.31. Valve area (VTI): 0.98 cm^2. Indexed valve area (VTI): 0.48 cm^2/m^2. Peak velocity ratio of LVOT to aortic valve: 0.27. Valve area (Vmax): 0.85 cm^2. Indexed valve area (Vmax): 0.42 cm^2/m^2. Mean velocity ratio of LVOT to aortic valve: 0.29. Valve area (Vmean): 0.91 cm^2. Indexed valve area (Vmean): 0.44 cm^2/m^2. Mean gradient (S): 39 mm Hg. Peak gradient (S): 61 mm Hg.  ------------------------------------------------------------------- Aorta: Aortic root: The aortic root was normal in size. Ascending aorta: The ascending aorta was normal in size.  ------------------------------------------------------------------- Mitral valve: Calcified annulus. Doppler: There was trivial regurgitation. Peak gradient (D): 9 mm  Hg.  ------------------------------------------------------------------- Left atrium: The atrium was mildly dilated.  ------------------------------------------------------------------- Atrial septum: No defect or patent foramen ovale was identified.  ------------------------------------------------------------------- Right ventricle: The cavity size was mildly dilated. Systolic function was normal.  ------------------------------------------------------------------- Pulmonic valve: The valve appears to be grossly normal. Doppler: There was no significant regurgitation.  ------------------------------------------------------------------- Tricuspid valve: Doppler: There was trivial regurgitation.  ------------------------------------------------------------------- Pulmonary artery: Poorly visualized.  ------------------------------------------------------------------- Right atrium: The atrium  was mildly dilated.  ------------------------------------------------------------------- Pericardium: There was no pericardial effusion.  ------------------------------------------------------------------- Systemic veins: Inferior vena cava: The vessel was dilated. The respirophasic diameter changes were blunted (< 50%), consistent with elevated central venous pressure.  STS Risk Calculator: Procedure: AV Replacement  Risk of Mortality: 1.794%  Morbidity or Mortality: 13.847%  Long Length of Stay: 6.066%  Short Length of Stay: 50.165%  Permanent Stroke: 0.59%  Prolonged Ventilation: 6.612%  DSW Infection: 0.25%  Renal Failure: 2.81%  Reoperation: 6.218%   ASSESSMENT AND PLAN: 60 year old gentleman with severe, stage D, symptomatic aortic stenosis. I have personally reviewed his echo images. This demonstrates bulky calcification of the aortic valve leaflets with severe aortic stenosis and moderate aortic insufficiency. Aortic valve mobility is severely limited. Left ventricular function appears normal.  I have reviewed the natural history of aortic stenosis with the patient and his wife who is present today. We have discussed the limitations of medical therapy and the poor prognosis associated with symptomatic aortic stenosis. We have reviewed potential treatment options, including palliative medical therapy, conventional surgical aortic valve replacement, and transcatheter aortic valve replacement. We discussed treatment options in the context of this patient's specific comorbid medical conditions. While his STS risk of mortality with cardiac surgery is low, his history of extensive chest radiation and tumor which apparently surrounded the aorta at one point would certainly raise his surgical risk significantly. In addition, the patient has neuroendocrine pancreatic cancer with an apparent prognosis of approximately 5 years. I think that considering those circumstances, TAVR may be a  reasonable treatment option for him. He will require a CT scan of the heart to assess his valve morphology. Will also arrange a CT angiogram of the chest, abdomen, and pelvis. This will help to assess for pulmonary parenchymal disease as well as vascular access. He will then undergo right and left heart catheterization. I have reviewed the risks, indications, and alternatives to cardiac catheterization, possible angioplasty, and stenting with the patient. Risks include but are not limited to bleeding, infection, vascular injury, stroke, myocardial infection, arrhythmia, kidney injury, radiation-related injury in the case of prolonged fluoroscopy use, emergency cardiac surgery, and death. The patient understands the risks of serious complication is 1-2 in 123XX123 with diagnostic cardiac cath and 1-2% or less with angioplasty/stenting.   After his studies are completed, will arrange for cardiac surgical consultation as part of a multidisciplinary approach to his aortic valve disease in the setting of his  comorbid conditions.  Current medicines are reviewed with the patient today.  The patient does not have concerns regarding medicines.  Labs/ tests ordered today include:   Orders Placed This Encounter  Procedures  . CT Angio Chest W/Cm &/Or Wo Cm  . CT Angio Abd/Pel w/ and/or w/o  . CT Coronary Morp W/Cta Cor W/Score W/Ca W/Cm &/Or Wo/Cm  . Basic Metabolic Panel (BMET)  . CBC  . INR/PT   Signed, Ronald Mocha, MD  01/12/2016 1:00 PM    [place], [place], [place]  [address] Phone: [phone]; Fax: [phone]   

## 2016-01-17 ENCOUNTER — Ambulatory Visit (HOSPITAL_COMMUNITY)
Admission: RE | Admit: 2016-01-17 | Discharge: 2016-01-17 | Disposition: A | Discharge status: Home / Self Care | Payer: BLUE CROSS/BLUE SHIELD | Source: Ambulatory Visit | Attending: Cardiovascular Disease | Admitting: Cardiovascular Disease

## 2016-01-17 DIAGNOSIS — I350 Nonrheumatic aortic (valve) stenosis: Secondary | ICD-10-CM

## 2016-01-17 DIAGNOSIS — M899 Disorder of bone, unspecified: Secondary | ICD-10-CM | POA: Diagnosis not present

## 2016-01-17 DIAGNOSIS — I311 Chronic constrictive pericarditis: Secondary | ICD-10-CM | POA: Insufficient documentation

## 2016-01-17 DIAGNOSIS — C787 Secondary malignant neoplasm of liver and intrahepatic bile duct: Secondary | ICD-10-CM | POA: Insufficient documentation

## 2016-01-17 DIAGNOSIS — R918 Other nonspecific abnormal finding of lung field: Secondary | ICD-10-CM | POA: Diagnosis not present

## 2016-01-17 DIAGNOSIS — K8020 Calculus of gallbladder without cholecystitis without obstruction: Secondary | ICD-10-CM | POA: Diagnosis not present

## 2016-01-17 DIAGNOSIS — I700 Atherosclerosis of aorta: Secondary | ICD-10-CM | POA: Insufficient documentation

## 2016-01-17 DIAGNOSIS — I82B12 Acute embolism and thrombosis of left subclavian vein: Secondary | ICD-10-CM | POA: Diagnosis not present

## 2016-01-17 DIAGNOSIS — N281 Cyst of kidney, acquired: Secondary | ICD-10-CM | POA: Diagnosis not present

## 2016-01-17 DIAGNOSIS — I517 Cardiomegaly: Secondary | ICD-10-CM | POA: Insufficient documentation

## 2016-01-17 DIAGNOSIS — I771 Stricture of artery: Secondary | ICD-10-CM | POA: Diagnosis not present

## 2016-01-17 DIAGNOSIS — C801 Malignant (primary) neoplasm, unspecified: Secondary | ICD-10-CM | POA: Insufficient documentation

## 2016-01-17 DIAGNOSIS — N433 Hydrocele, unspecified: Secondary | ICD-10-CM | POA: Insufficient documentation

## 2016-01-17 MED ORDER — NITROGLYCERIN 0.4 MG SL SUBL
SUBLINGUAL_TABLET | SUBLINGUAL | Status: AC
  Filled 2016-01-17: qty 2

## 2016-01-17 MED ORDER — METOPROLOL TARTRATE 5 MG/5ML IV SOLN
5.0000 mg | INTRAVENOUS | Status: DC | PRN
  Administered 2016-01-17: 5 mg via INTRAVENOUS

## 2016-01-17 MED ORDER — IOPAMIDOL (ISOVUE-370) INJECTION 76%
INTRAVENOUS | Status: AC
  Filled 2016-01-17: qty 50

## 2016-01-17 MED ORDER — IOPAMIDOL (ISOVUE-370) INJECTION 76%
70.0000 mL | Freq: Once | INTRAVENOUS | Status: AC | PRN
  Administered 2016-01-17: 70 mL via INTRAVENOUS

## 2016-01-17 MED ORDER — NITROGLYCERIN 0.4 MG SL SUBL: 0.8000 mg | SUBLINGUAL_TABLET | SUBLINGUAL | Status: DC | PRN

## 2016-01-17 MED ORDER — METOPROLOL TARTRATE 5 MG/5ML IV SOLN
INTRAVENOUS | Status: AC
  Filled 2016-01-17: qty 10

## 2016-01-17 MED ORDER — IOPAMIDOL (ISOVUE-370) INJECTION 76%
INTRAVENOUS | Status: AC
  Administered 2016-01-17: 80 mL
  Filled 2016-01-17: qty 100

## 2016-01-17 MED ORDER — NITROGLYCERIN 0.4 MG SL SUBL
0.4000 mg | SUBLINGUAL_TABLET | SUBLINGUAL | Status: DC | PRN
  Administered 2016-01-17: 0.8 mg via SUBLINGUAL

## 2016-01-24 ENCOUNTER — Other Ambulatory Visit: Payer: Self-pay

## 2016-01-24 ENCOUNTER — Ambulatory Visit (HOSPITAL_COMMUNITY)
Admission: RE | Admit: 2016-01-24 | Discharge: 2016-01-24 | Disposition: A | Discharge status: Home / Self Care | Payer: BLUE CROSS/BLUE SHIELD | Source: Ambulatory Visit | Attending: Cardiovascular Disease | Admitting: Cardiovascular Disease

## 2016-01-24 ENCOUNTER — Encounter (HOSPITAL_COMMUNITY)
Admission: RE | Disposition: A | Discharge status: Home / Self Care | Payer: Self-pay | Source: Ambulatory Visit | Attending: Cardiovascular Disease

## 2016-01-24 ENCOUNTER — Encounter (HOSPITAL_COMMUNITY): Payer: Self-pay | Admitting: Cardiovascular Disease

## 2016-01-24 DIAGNOSIS — I10 Essential (primary) hypertension: Secondary | ICD-10-CM | POA: Insufficient documentation

## 2016-01-24 DIAGNOSIS — Z9221 Personal history of antineoplastic chemotherapy: Secondary | ICD-10-CM | POA: Insufficient documentation

## 2016-01-24 DIAGNOSIS — Z880 Allergy status to penicillin: Secondary | ICD-10-CM | POA: Insufficient documentation

## 2016-01-24 DIAGNOSIS — Z7982 Long term (current) use of aspirin: Secondary | ICD-10-CM | POA: Insufficient documentation

## 2016-01-24 DIAGNOSIS — J8410 Pulmonary fibrosis, unspecified: Secondary | ICD-10-CM | POA: Diagnosis not present

## 2016-01-24 DIAGNOSIS — E7800 Pure hypercholesterolemia, unspecified: Secondary | ICD-10-CM | POA: Insufficient documentation

## 2016-01-24 DIAGNOSIS — I350 Nonrheumatic aortic (valve) stenosis: Secondary | ICD-10-CM | POA: Diagnosis not present

## 2016-01-24 DIAGNOSIS — I700 Atherosclerosis of aorta: Secondary | ICD-10-CM | POA: Diagnosis not present

## 2016-01-24 DIAGNOSIS — Z923 Personal history of irradiation: Secondary | ICD-10-CM | POA: Insufficient documentation

## 2016-01-24 DIAGNOSIS — C761 Malignant neoplasm of thorax: Secondary | ICD-10-CM | POA: Diagnosis not present

## 2016-01-24 DIAGNOSIS — C6290 Malignant neoplasm of unspecified testis, unspecified whether descended or undescended: Secondary | ICD-10-CM | POA: Diagnosis not present

## 2016-01-24 HISTORY — PX: CARDIAC CATHETERIZATION: SHX172

## 2016-01-24 LAB — POCT I-STAT 3, VENOUS BLOOD GAS (G3P V)
Acid-Base Excess: 3 mmol/L — ABNORMAL HIGH (ref 0.0–2.0)
Bicarbonate: 29.9 mEq/L — ABNORMAL HIGH (ref 20.0–24.0)
O2 SAT: 60 %
PCO2 VEN: 55.6 mmHg — AB (ref 45.0–50.0)
PH VEN: 7.338 — AB (ref 7.250–7.300)
PO2 VEN: 34 mmHg (ref 31.0–45.0)
TCO2: 32 mmol/L (ref 0–100)

## 2016-01-24 LAB — POCT I-STAT 3, ART BLOOD GAS (G3+)
Acid-Base Excess: 1 mmol/L (ref 0.0–2.0)
Bicarbonate: 27.4 mEq/L — ABNORMAL HIGH (ref 20.0–24.0)
O2 Saturation: 96 %
PCO2 ART: 48.1 mmHg — AB (ref 35.0–45.0)
PH ART: 7.364 (ref 7.350–7.450)
TCO2: 29 mmol/L (ref 0–100)
pO2, Arterial: 86 mmHg (ref 80.0–100.0)

## 2016-01-24 SURGERY — RIGHT/LEFT HEART CATH AND CORONARY ANGIOGRAPHY

## 2016-01-24 MED ORDER — SODIUM CHLORIDE 0.9 % IV SOLN: 250.0000 mL | INTRAVENOUS | Status: DC | PRN

## 2016-01-24 MED ORDER — SODIUM CHLORIDE 0.9% FLUSH: 3.0000 mL | INTRAVENOUS | Status: DC | PRN

## 2016-01-24 MED ORDER — LIDOCAINE HCL (PF) 1 % IJ SOLN
INTRAMUSCULAR | Status: DC | PRN
  Administered 2016-01-24 (×2): 2 mL
  Administered 2016-01-24: 20 mL

## 2016-01-24 MED ORDER — FENTANYL CITRATE (PF) 100 MCG/2ML IJ SOLN
INTRAMUSCULAR | Status: DC | PRN
  Administered 2016-01-24: 25 ug via INTRAVENOUS

## 2016-01-24 MED ORDER — SODIUM CHLORIDE 0.9 % WEIGHT BASED INFUSION
3.0000 mL/kg/h | INTRAVENOUS | Status: AC
Start: 2016-01-24 — End: 2016-01-24

## 2016-01-24 MED ORDER — ASPIRIN 81 MG PO CHEW: 81.0000 mg | CHEWABLE_TABLET | ORAL | Status: DC

## 2016-01-24 MED ORDER — LIDOCAINE HCL (PF) 1 % IJ SOLN
INTRAMUSCULAR | Status: AC
  Filled 2016-01-24: qty 30

## 2016-01-24 MED ORDER — HEPARIN SODIUM (PORCINE) 1000 UNIT/ML IJ SOLN
INTRAMUSCULAR | Status: AC
  Filled 2016-01-24: qty 1

## 2016-01-24 MED ORDER — SODIUM CHLORIDE 0.9 % WEIGHT BASED INFUSION: 1.0000 mL/kg/h | INTRAVENOUS | Status: DC

## 2016-01-24 MED ORDER — FENTANYL CITRATE (PF) 100 MCG/2ML IJ SOLN
INTRAMUSCULAR | Status: AC
  Filled 2016-01-24: qty 2

## 2016-01-24 MED ORDER — VERAPAMIL HCL 2.5 MG/ML IV SOLN
INTRAVENOUS | Status: AC
  Filled 2016-01-24: qty 2

## 2016-01-24 MED ORDER — HEPARIN (PORCINE) IN NACL 2-0.9 UNIT/ML-% IJ SOLN
INTRAMUSCULAR | Status: DC | PRN
  Administered 2016-01-24: 1000 mL

## 2016-01-24 MED ORDER — MIDAZOLAM HCL 2 MG/2ML IJ SOLN
INTRAMUSCULAR | Status: AC
  Filled 2016-01-24: qty 2

## 2016-01-24 MED ORDER — SODIUM CHLORIDE 0.9% FLUSH: 3.0000 mL | Freq: Two times a day (BID) | INTRAVENOUS | Status: DC

## 2016-01-24 MED ORDER — SODIUM CHLORIDE 0.9 % IV SOLN: INTRAVENOUS | Status: DC

## 2016-01-24 MED ORDER — ONDANSETRON HCL 4 MG/2ML IJ SOLN: 4.0000 mg | Freq: Four times a day (QID) | INTRAMUSCULAR | Status: DC | PRN

## 2016-01-24 MED ORDER — HEPARIN (PORCINE) IN NACL 2-0.9 UNIT/ML-% IJ SOLN
INTRAMUSCULAR | Status: AC
  Filled 2016-01-24: qty 1000

## 2016-01-24 MED ORDER — ACETAMINOPHEN 325 MG PO TABS: 650.0000 mg | ORAL_TABLET | ORAL | Status: DC | PRN

## 2016-01-24 MED ORDER — IOPAMIDOL (ISOVUE-370) INJECTION 76%
INTRAVENOUS | Status: AC
  Filled 2016-01-24: qty 100

## 2016-01-24 MED ORDER — OXYCODONE-ACETAMINOPHEN 5-325 MG PO TABS: 1.0000 | ORAL_TABLET | ORAL | Status: DC | PRN

## 2016-01-24 MED ORDER — MIDAZOLAM HCL 2 MG/2ML IJ SOLN
INTRAMUSCULAR | Status: DC | PRN
  Administered 2016-01-24: 2 mg via INTRAVENOUS

## 2016-01-24 SURGICAL SUPPLY — 21 items
CATH BALLN WEDGE 5F 110CM (CATHETERS) ×1 IMPLANT
CATH INFINITI 5 FR JL3.5 (CATHETERS) ×1 IMPLANT
CATH INFINITI 5FR ANG PIGTAIL (CATHETERS) ×1 IMPLANT
CATH INFINITI JR4 5F (CATHETERS) ×1 IMPLANT
CATH SITESEER 5F NTR (CATHETERS) ×1 IMPLANT
CATH SWAN GANZ 7F STRAIGHT (CATHETERS) ×1 IMPLANT
DEVICE RAD COMP TR BAND LRG (VASCULAR PRODUCTS) ×1 IMPLANT
GLIDESHEATH SLEND SS 6F .021 (SHEATH) ×2 IMPLANT
GUIDEWIRE .025 260CM (WIRE) ×1 IMPLANT
KIT HEART LEFT (KITS) ×2 IMPLANT
KIT HEART RIGHT NAMIC (KITS) ×2 IMPLANT
PACK CARDIAC CATHETERIZATION (CUSTOM PROCEDURE TRAY) ×2 IMPLANT
SHEATH FAST CATH BRACH 5F 5CM (SHEATH) ×1 IMPLANT
SHEATH PINNACLE 5F 10CM (SHEATH) ×1 IMPLANT
SHEATH PINNACLE 7F 10CM (SHEATH) ×1 IMPLANT
TRANSDUCER W/STOPCOCK (MISCELLANEOUS) ×3 IMPLANT
TUBING ART PRESS 72  MALE/FEM (TUBING) ×1
TUBING ART PRESS 72 MALE/FEM (TUBING) IMPLANT
TUBING CIL FLEX 10 FLL-RA (TUBING) ×2 IMPLANT
WIRE EMERALD 3MM-J .035X150CM (WIRE) ×1 IMPLANT
WIRE SAFE-T 1.5MM-J .035X260CM (WIRE) ×1 IMPLANT

## 2016-01-24 NOTE — Progress Notes (Signed)
Site area: Rt fem art and Rt fem venous Site Prior to Removal:  Level 0 Pressure Applied For: 25 min Manual:   yes Patient Status During Pull:  A/O Post Pull Site:  Level 0 Post Pull Instructions Given: Post instructions given and pt understands.  Post Pull Pulses Present: 2+ rt dp/pt Dressing Applied:  tegaderm and 4x4 Bedrest begins @ 10:15:00 Comments: Lt brachial sheath removed. Manual Pressure held for 27min. Tegaderm and 2x2 applied.  Pt leaves cath lab holding in stable condition. Rt groin and lt brachial sites unremarkable.

## 2016-01-24 NOTE — Discharge Instructions (Signed)
Angiogram, Care After °Refer to this sheet in the next few weeks. These instructions provide you with information about caring for yourself after your procedure. Your health care provider may also give you more specific instructions. Your treatment has been planned according to current medical practices, but problems sometimes occur. Call your health care provider if you have any problems or questions after your procedure. °WHAT TO EXPECT AFTER THE PROCEDURE °After your procedure, it is typical to have the following: °· Bruising at the catheter insertion site that usually fades within 1-2 weeks. °· Blood collecting in the tissue (hematoma) that may be painful to the touch. It should usually decrease in size and tenderness within 1-2 weeks. °HOME CARE INSTRUCTIONS °· Take medicines only as directed by your health care provider. °· You may shower 24-48 hours after the procedure or as directed by your health care provider. Remove the bandage (dressing) and gently wash the site with plain soap and water. Pat the area dry with a clean towel. Do not rub the site, because this may cause bleeding. °· Do not take baths, swim, or use a hot tub until your health care provider approves. °· Check your insertion site every day for redness, swelling, or drainage. °· Do not apply powder or lotion to the site. °· Do not lift over 10 lb (4.5 kg) for 5 days after your procedure or as directed by your health care provider. °· Ask your health care provider when it is okay to: °¨ Return to work or school. °¨ Resume usual physical activities or sports. °¨ Resume sexual activity. °· Do not drive home if you are discharged the same day as the procedure. Have someone else drive you. °· You may drive 24 hours after the procedure unless otherwise instructed by your health care provider. °· Do not operate machinery or power tools for 24 hours after the procedure or as directed by your health care provider. °· If your procedure was done as an  outpatient procedure, which means that you went home the same day as your procedure, a responsible adult should be with you for the first 24 hours after you arrive home. °· Keep all follow-up visits as directed by your health care provider. This is important. °SEEK MEDICAL CARE IF: °· You have a fever. °· You have chills. °· You have increased bleeding from the catheter insertion site. Hold pressure on the site. °SEEK IMMEDIATE MEDICAL CARE IF: °· You have unusual pain at the catheter insertion site. °· You have redness, warmth, or swelling at the catheter insertion site. °· You have drainage (other than a small amount of blood on the dressing) from the catheter insertion site. °· The catheter insertion site is bleeding, and the bleeding does not stop after 30 minutes of holding steady pressure on the site. °· The area near or just beyond the catheter insertion site becomes pale, cool, tingly, or numb. °  °This information is not intended to replace advice given to you by your health care provider. Make sure you discuss any questions you have with your health care provider. °  °Document Released: 02/07/2005 Document Revised: 08/12/2014 Document Reviewed: 12/23/2012 °Elsevier Interactive Patient Education ©2016 [employer] ° °

## 2016-01-24 NOTE — Interval H&P Note (Signed)
History and Physical Interval Note:  01/24/2016 8:41 AM  Ronald Thompson  has presented today for surgery, with the diagnosis of arotic stenosis  The various methods of treatment have been discussed with the patient and family. After consideration of risks, benefits and other options for treatment, the patient has consented to  Procedure(s): Right/Left Heart Cath and Coronary Angiography (N/A) as a surgical intervention .  The patient's history has been reviewed, patient examined, no change in status, stable for surgery.  I have reviewed the patient's chart and labs.  Questions were answered to the patient's satisfaction.     [name]

## 2016-01-24 NOTE — H&P (View-Only) (Signed)
Cardiology Office Note Date:  01/12/2016   ID:  Ronald Thompson, DOB [birth year], MRN [id number]  PCP:  Cathlean Cower, MD  Cardiologist:  [name], MD    Chief Complaint  Patient presents with  . Shortness of Breath    History of Present Illness: Ronald Thompson is a 60 y.o. male who presents for evaluation of severe aortic stenosis.   In 1990 he was diagnosed with seminoma in his chest complicated by phrenic nerve and cardiac compression. He had problems with chest pain around the same time and was treated for recurrent pericarditis. 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 He then was able to get back to fairly normal activities with some mild limitation from shortness of breath. However, over the last few years he has developed progressive shortness of breath with activity. He now is dyspneic with one flight of stairs especially late in the day. He's had a longstanding heart murmur and has been followed for aortic stenosis. A recent echo done for evaluation of shortness of breath showed progressive aortic stenosis, now in the severe range with a mean gradient of 39 mmHg.   Symptoms are worse as the day goes on. He also complains of a feeling of tightness in his chest with heavy exertion. No lightheadedness or syncope. No orthopnea or PND.   He was diagnosed with neuroendocrine type pancreatic cancer in 2015 and he is followed closely by Dr [name]. His tumor has metastasized to his liver and it's progression has stabilized on sandostatin.   The patient is here with his wife today. He has 3 children, 2 in [place] and one in [place]. They have 7 grandchildren. He's active and is primarily limited with aerobic activity. He is able to walk on level ground but has to stop and rest with an incline. He has not had orthopnea, PND, or other symptoms of heart failure. He reports a good appetite.  Past Medical History    Diagnosis Date  . 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 Baker's cyst   . Hypertension   . Heart murmur   . Transfusion history     hx. 25 yrs ago-during cancer tx.  . 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 Lesion of left lung     hx.25 yrs ago- testicilar cancer related- only scarring left after tx.-no problems now  . Hypercholesteremia   . Pulmonary fibrosis ([place]) 11/08/2015    Past Surgical History  Procedure Laterality Date  . Thoracotomy  1990    wedge biopsy of mediastinal mass  . Back surgery      '14- rupt. disc   . Knee surgery      age 24 for Baker's cyst  . Eus N/A 02/10/2014    Procedure: UPPER ENDOSCOPIC ULTRASOUND (EUS) LINEAR;  Surgeon: [name], MD;  Location: [facility];  Service: Endoscopy;  Laterality: N/A;    Current Outpatient Prescriptions  Medication Sig Dispense Refill  . calcium carbonate (TUMS - DOSED IN MG ELEMENTAL CALCIUM) 500 MG chewable tablet Chew 3 tablets by mouth as needed for indigestion or heartburn.    Marland Kitchen ibuprofen (ADVIL,MOTRIN) 200 MG tablet Take 600 mg by mouth every 6 (six) hours as needed for mild pain or moderate pain.     Marland Kitchen losartan (COZAAR) 50 MG tablet Take 1 tablet (50 mg total) by  mouth every morning. 90 tablet 3  . Octreotide Acetate (SANDOSTATIN IJ) Inject as directed.    . simvastatin (ZOCOR) 20 MG tablet TAKE 1 TABLET (20 MG TOTAL) BY MOUTH AT BEDTIME. 30 tablet 5  . aspirin EC 81 MG tablet Take 1 tablet (81 mg total) by mouth daily. 90 tablet 3   No current facility-administered medications for this visit.    Allergies:   Penicillins   Social History:  The patient  reports that he has never smoked. He has never used smokeless tobacco. He reports that he drinks alcohol. He reports that he does not use illicit drugs.   Family History:  The patient's family history includes Cancer in his maternal aunt and paternal grandfather; Dementia in his  mother; Emphysema in his maternal aunt.   ROS:  Please see the history of present illness.  All other systems are reviewed and negative.   PHYSICAL EXAM: VS:  BP 126/70 mmHg  Pulse 72  Ht 5\' 10"  (1.778 m)  Wt 186 lb 12.8 oz (84.732 kg)  BMI 26.80 kg/m2 , BMI Body mass index is 26.8 kg/(m^2). GEN: Well nourished, well developed, in no acute distress HEENT: normal Neck: no JVD, no masses. bilateral carotid bruits Cardiac: RRR 3/6 harsh crescendo decrescendo murmur at the right upper sternal border and 2/6 early diastolic decrescendo murmur at the right upper sternal border         Respiratory:  clear to auscultation bilaterally, normal work of breathing GI: soft, nontender, nondistended, + BS MS: no deformity or atrophy Ext: no pretibial edema, pedal pulses 2+= bilaterally Skin: warm and dry, no rash Neuro:  Strength and sensation are intact Psych: euthymic mood, full affect  EKG:  EKG is not ordered today.  Recent Labs: 11/08/2015: ALT 28; BUN 21; Creatinine, Ser 0.97; Hemoglobin 15.9; Platelets 190.0; Potassium 4.5; Sodium 139; TSH 1.65   Lipid Panel     Component Value Date/Time   CHOL 158 11/08/2015 1459   TRIG 75.0 11/08/2015 1459   HDL 51.50 11/08/2015 1459   CHOLHDL 3 11/08/2015 1459   VLDL 15.0 11/08/2015 1459   LDLCALC 92 11/08/2015 1459   LDLDIRECT 139.2 10/10/2008 0800      Wt Readings from Last 3 Encounters:  01/12/16 186 lb 12.8 oz (84.732 kg)  01/10/16 187 lb 12.8 oz (85.186 kg)  12/18/15 188 lb 3.2 oz (85.367 kg)     Cardiac Studies Reviewed: 2D Echo 12/06/2015: Left ventricle: The cavity size was normal. Wall thickness was increased in a pattern of mild LVH. Systolic function was normal. The estimated ejection fraction was in the range of 60% to 65%. Doppler parameters are consistent with abnormal left ventricular relaxation (grade 1 diastolic dysfunction). The E/e&' ratio is >15, suggesting elevated LV filling  pressure.  ------------------------------------------------------------------- Aortic valve: Heavily calcified aortic valve with severe aortic stenosis. Doppler: There was mild regurgitation. VTI ratio of LVOT to aortic valve: 0.31. Valve area (VTI): 0.98 cm^2. Indexed valve area (VTI): 0.48 cm^2/m^2. Peak velocity ratio of LVOT to aortic valve: 0.27. Valve area (Vmax): 0.85 cm^2. Indexed valve area (Vmax): 0.42 cm^2/m^2. Mean velocity ratio of LVOT to aortic valve: 0.29. Valve area (Vmean): 0.91 cm^2. Indexed valve area (Vmean): 0.44 cm^2/m^2. Mean gradient (S): 39 mm Hg. Peak gradient (S): 61 mm Hg.  ------------------------------------------------------------------- Aorta: Aortic root: The aortic root was normal in size. Ascending aorta: The ascending aorta was normal in size.  ------------------------------------------------------------------- Mitral valve: Calcified annulus. Doppler: There was trivial regurgitation. Peak gradient (D): 9 mm  Hg.  ------------------------------------------------------------------- Left atrium: The atrium was mildly dilated.  ------------------------------------------------------------------- Atrial septum: No defect or patent foramen ovale was identified.  ------------------------------------------------------------------- Right ventricle: The cavity size was mildly dilated. Systolic function was normal.  ------------------------------------------------------------------- Pulmonic valve: The valve appears to be grossly normal. Doppler: There was no significant regurgitation.  ------------------------------------------------------------------- Tricuspid valve: Doppler: There was trivial regurgitation.  ------------------------------------------------------------------- Pulmonary artery: Poorly visualized.  ------------------------------------------------------------------- Right atrium: The atrium  was mildly dilated.  ------------------------------------------------------------------- Pericardium: There was no pericardial effusion.  ------------------------------------------------------------------- Systemic veins: Inferior vena cava: The vessel was dilated. The respirophasic diameter changes were blunted (< 50%), consistent with elevated central venous pressure.  STS Risk Calculator: Procedure: AV Replacement  Risk of Mortality: 1.794%  Morbidity or Mortality: 13.847%  Long Length of Stay: 6.066%  Short Length of Stay: 50.165%  Permanent Stroke: 0.59%  Prolonged Ventilation: 6.612%  DSW Infection: 0.25%  Renal Failure: 2.81%  Reoperation: 6.218%   ASSESSMENT AND PLAN: 60 year old gentleman with severe, stage D, symptomatic aortic stenosis. I have personally reviewed his echo images. This demonstrates bulky calcification of the aortic valve leaflets with severe aortic stenosis and moderate aortic insufficiency. Aortic valve mobility is severely limited. Left ventricular function appears normal.  I have reviewed the natural history of aortic stenosis with the patient and his wife who is present today. We have discussed the limitations of medical therapy and the poor prognosis associated with symptomatic aortic stenosis. We have reviewed potential treatment options, including palliative medical therapy, conventional surgical aortic valve replacement, and transcatheter aortic valve replacement. We discussed treatment options in the context of this patient's specific comorbid medical conditions. While his STS risk of mortality with cardiac surgery is low, his history of extensive chest radiation and tumor which apparently surrounded the aorta at one point would certainly raise his surgical risk significantly. In addition, the patient has neuroendocrine pancreatic cancer with an apparent prognosis of approximately 5 years. I think that considering those circumstances, TAVR may be a  reasonable treatment option for him. He will require a CT scan of the heart to assess his valve morphology. Will also arrange a CT angiogram of the chest, abdomen, and pelvis. This will help to assess for pulmonary parenchymal disease as well as vascular access. He will then undergo right and left heart catheterization. I have reviewed the risks, indications, and alternatives to cardiac catheterization, possible angioplasty, and stenting with the patient. Risks include but are not limited to bleeding, infection, vascular injury, stroke, myocardial infection, arrhythmia, kidney injury, radiation-related injury in the case of prolonged fluoroscopy use, emergency cardiac surgery, and death. The patient understands the risks of serious complication is 1-2 in [id number] with diagnostic cardiac cath and 1-2% or less with angioplasty/stenting.   After his studies are completed, will arrange for cardiac surgical consultation as part of a multidisciplinary approach to his aortic valve disease in the setting of his  comorbid conditions.  Current medicines are reviewed with the patient today.  The patient does not have concerns regarding medicines.  Labs/ tests ordered today include:   Orders Placed This Encounter  Procedures  . CT Angio Chest W/Cm &/Or Wo Cm  . CT Angio Abd/Pel w/ and/or w/o  . CT Coronary Morp W/Cta Cor W/Score W/Ca W/Cm &/Or Wo/Cm  . Basic Metabolic Panel (BMET)  . CBC  . INR/PT   Signed, [name], MD  01/12/2016 1:00 PM    [address] [facility] [address], [place], [place]  [address] Phone: [phone]; Fax: [phone]   

## 2016-01-25 MED FILL — Heparin Sodium (Porcine) Inj 1000 Unit/ML: INTRAMUSCULAR | Qty: 10 | Status: AC

## 2016-01-25 MED FILL — Verapamil HCl IV Soln 2.5 MG/ML: INTRAVENOUS | Qty: 2 | Status: AC

## 2016-01-26 ENCOUNTER — Ambulatory Visit (HOSPITAL_BASED_OUTPATIENT_CLINIC_OR_DEPARTMENT_OTHER): Payer: BLUE CROSS/BLUE SHIELD

## 2016-01-26 VITALS — BP 135/69 | HR 75 | Temp 97.8°F | Resp 18

## 2016-01-26 DIAGNOSIS — D3A8 Other benign neuroendocrine tumors: Secondary | ICD-10-CM

## 2016-01-26 DIAGNOSIS — C7A8 Other malignant neuroendocrine tumors: Secondary | ICD-10-CM | POA: Diagnosis not present

## 2016-01-26 DIAGNOSIS — C7B8 Other secondary neuroendocrine tumors: Secondary | ICD-10-CM | POA: Diagnosis not present

## 2016-01-26 MED ORDER — OCTREOTIDE ACETATE 30 MG IM KIT
30.0000 mg | PACK | Freq: Once | INTRAMUSCULAR | Status: AC
  Administered 2016-01-26: 30 mg via INTRAMUSCULAR
  Filled 2016-01-26: qty 1

## 2016-01-26 NOTE — Patient Instructions (Signed)

## 2016-01-31 ENCOUNTER — Ambulatory Visit: Payer: BLUE CROSS/BLUE SHIELD | Attending: Cardiovascular Disease | Admitting: Physical Therapy

## 2016-01-31 ENCOUNTER — Encounter: Payer: Self-pay | Admitting: Physical Therapy

## 2016-01-31 DIAGNOSIS — I350 Nonrheumatic aortic (valve) stenosis: Secondary | ICD-10-CM | POA: Diagnosis present

## 2016-01-31 DIAGNOSIS — R262 Difficulty in walking, not elsewhere classified: Secondary | ICD-10-CM

## 2016-01-31 NOTE — Therapy (Signed)
[facility], [place], [address] Phone: [phone]   Fax:  [phone]  Physical Therapy Evaluation  Patient Details  Name: Ronald Thompson MRN: [id number] Date of Birth: 1955/11/13 Referring Provider: Dr. [name]  Encounter Date: 01/31/2016      PT End of Session - 01/31/16 1623    Visit Number 1   PT Start Time 1502   PT Stop Time 1540   PT Time Calculation (min) 38 min      Past Medical History  Diagnosis Date  . 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 Baker's cyst   . Hypertension   . Heart murmur   . Transfusion history     hx. 25 yrs ago-during cancer tx.  . 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 Lesion of left lung     hx.25 yrs ago- testicilar cancer related- only scarring left after tx.-no problems now  . Hypercholesteremia   . Pulmonary fibrosis ([place]) 11/08/2015    Past Surgical History  Procedure Laterality Date  . Thoracotomy  1990    wedge biopsy of mediastinal mass  . Back surgery      '14- rupt. disc   . Knee surgery      age 19 for Baker's cyst  . Eus N/A 02/10/2014    Procedure: UPPER ENDOSCOPIC ULTRASOUND (EUS) LINEAR;  Surgeon: [name], MD;  Location: [facility];  Service: Endoscopy;  Laterality: N/A;  . Cardiac catheterization N/A 01/24/2016    Procedure: Right/Left Heart Cath and Coronary Angiography;  Surgeon: [name], MD;  Location: [facility];  Service: Cardiovascular;  Laterality: N/A;    There were no vitals filed for this visit.       Subjective Assessment - 01/31/16 1507    Subjective Pt reports progressive SOB with heavy activity and occasional chest tightness with heavy exertion, lifts weights 3-5 days per week - nothing too heavy, walks 2-3 miles per day but notices heavy breathing on this inclines. Pt/wife report that the progressive SOB really became noticable  about 6 months ago when his wife really noticed increased SOB with ascending the stairs in their home.    Patient Stated Goals to address heart issue and maintain mobility in remaining years   Currently in Pain? No/denies            [facility] PT Assessment - 01/31/16 0001    Assessment   Medical Diagnosis severe aortic stenosis   Referring Provider Dr. [name]   Onset Date/Surgical Date 08/02/15  approximate   Precautions   Precautions None   Restrictions   Weight Bearing Restrictions No   Balance Screen   Has the patient fallen in the past 6 months No   Has the patient had a decrease in activity level because of a fear of falling?  No   Is the patient reluctant to leave their home because of a fear of falling?  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 Pt was able to ambulate normal distances on level indoor surface at time of eval. Pt did demonstrate mild SOB even in 6 minute walk test. He and wife report difficulty walking with inclines and stairs.     6 Minute Walk- Baseline   6 Minute Walk- Baseline yes   BP (mmHg) 140/76 mmHg   HR (bpm) 80   02 Sat (%RA) 94 %   Modified Borg Scale for Dyspnea 0- Nothing at all   Perceived Rate of  Exertion (Borg) 6-   6 Minute walk- Post Test   6 Minute Walk Post Test yes   BP (mmHg) 164/80 mmHg   HR (bpm) 103   02 Sat (%RA) 92 %   Modified Borg Scale for Dyspnea 2- Mild shortness of breath   Perceived Rate of Exertion (Borg) 9- very light   6 minute walk test results    Aerobic Endurance Distance Walked 1345   Endurance additional comments Patient maintained steady pace and no rest breaks required. He was mildly SOB afte 6 minute walk.           [facility] Pre-Surgical Assessment - 01/31/16 0001    5 Meter Walk Test- trial 1 4 sec   5 Meter Walk Test- trial 2 4 sec.    5 Meter Walk Test- trial 3 4 sec.  </= 6 sec WNL   5 meter walk test average 4 sec   Timed Up & Go Test trial  9 sec.   Comments </= 10 sec WNL   4 Stage Balance Test tolerated for:  10 sec.   4 Stage Balance Test Position 4   comment Not indative of fall risk   Sit To Stand Test- trial 1 9 sec.   Comment </= 11.4 sec WNL   ADL/IADL Independent with: Bathing;Dressing;Meal prep;Finances;[place] work   ADL/IADL Fraility Index Vulnerable                                     Plan - 01/31/16 1623    Clinical Impression Statement Pt is a 60 y/o male presenting to OP PT for evaluation prior to possible TAVR surgery due to severe aortic stenosis. Pt is undergoing active CA treatment and per his report has a 5 year prognosis. Over the past 6-12 months, patient and wife have noticed progressive SOB with activity particularly with walking on inclines and climbing stairs. Pt lives in a 2 story home with his bedroom on the second level. He continues to lift light to moderate weights 3-5 days per week at his home and walks 2-3 miles about 5-6 times per week. He reports symptoms are worse later in the day compared to the morning. Pt presents with good ROM and strength, good balance, good walking speed and overall good aerobic endurance in the 6 minute walk test. Pt was mildly SOB following test.    PT  Frequency One time visit   Consulted and Agree with Plan of Care Patient;Family member/caregiver      Patient demonstrated  the following deficits and impairments:     Visit Diagnosis: Difficulty in walking, not elsewhere classified - Plan: PT plan of care cert/re-cert  Aortic stenosis, severe - Plan: PT plan of care cert/re-cert     Problem List Patient Active Problem List   Diagnosis Date Noted  . Dyspnea 11/08/2015  . Pulmonary fibrosis ([place]) 11/08/2015  . Nocturia 11/08/2015  . Snoring 11/08/2015  . Deviated nasal septum 11/08/2015  . Bilateral hearing loss 11/08/2015  . Erectile dysfunction 02/07/2015  . Fatigue 02/07/2015  . Olecranon bursitis 09/27/2014  . Essential tremor 09/27/2014  . Primary neuroendocrine tumor of pancreas 02/14/2014  . Abdominal pain, unspecified site 02/04/2014  . Dehydration 02/04/2014  . Aortic stenosis 02/03/2014  . History of shingles 02/03/2014  . Left lumbar radiculopathy 11/30/2012  . Encounter for preventative adult health care exam with abnormal findings 12/15/2011  . HEART MURMUR, SYSTOLIC [id number]  . PREHYPERTENSION 10/14/2008  . Hyperlipidemia 07/16/2007  . BAKER'S CYST 06/06/2007    Camila Norville, PT 01/31/2016, 4:31 PM  [facility] [address] [place], [place], [address] Phone: [phone]   Fax:  [phone]  Name: Ronald Thompson MRN: [id number] Date of Birth: [birth year]

## 2016-02-05 ENCOUNTER — Encounter: Payer: BLUE CROSS/BLUE SHIELD | Admitting: Surgery

## 2016-02-08 ENCOUNTER — Institutional Professional Consult (permissible substitution) (INDEPENDENT_AMBULATORY_CARE_PROVIDER_SITE_OTHER): Payer: BLUE CROSS/BLUE SHIELD | Admitting: Surgery

## 2016-02-08 ENCOUNTER — Encounter: Payer: Self-pay | Admitting: Surgery

## 2016-02-08 VITALS — BP 147/80 | HR 78 | Resp 20 | Ht 70.0 in | Wt 182.0 lb

## 2016-02-08 DIAGNOSIS — I350 Nonrheumatic aortic (valve) stenosis: Secondary | ICD-10-CM

## 2016-02-09 ENCOUNTER — Other Ambulatory Visit: Payer: Self-pay | Admitting: *Deleted

## 2016-02-09 ENCOUNTER — Encounter: Payer: Self-pay | Admitting: Surgery

## 2016-02-09 DIAGNOSIS — I350 Nonrheumatic aortic (valve) stenosis: Secondary | ICD-10-CM

## 2016-02-09 NOTE — Progress Notes (Signed)
Patient ID: Ronald Thompson, male   DOB: 1955/11/03, 60 y.o.   MRN: [id number]   [facility] CONSULTATION REPORT  Referring Provider is [name], MD PCP is Cathlean Cower, MD  Chief Complaint  Patient presents with  . 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 He apparently developed occlusion of the left subclavian and brachiocephalic vein from DVT and has chronic left arm and chest wall venous engorgement due to collaterals. Since that treatment he has had some chronic exertional dyspnea and was diagnosed with pulmonary fibrosis from radiation. He also developed pericarditis. He recovered from that treatment and did well until being diagnosed with metastatic pancreatic neuroendocrine tumor with a mass in the head of the pancreas and multiple liver mets. He has been treated with monthly Sandostatin with a stable pancreatic mass and slightly enlarging liver mets on his last scan in Jan 2017. He has a history of aortic stenosis since around 2011 but those records are not available. He notes that over the past few years he has had progressive shortness of breath with activity. He remains as active as possible but gets short of breath with going up one flight of stairs or walking up hills, especially later in the day when he is fatigued. He has had no chest pain or dizziness. His most recent echo on 12/06/2015 showed progression to severe aortic stenosis with a mean gradient of 39 mm Hg and a heavily calcified aortic valve with mild AI. The dimensionless index was 0.27 and the AVA 0.85 cm2. The AI pressure  half-time was 308 ms. Cath on 01/24/2016 showed no coronary disease but 3+ AI by aortography.  The patient lives with his wife and has 3 children, 7 grandchildren. He has remained as active as possible and continues to walk. He tries to avoid hills and stairs. He has been eating well and maintaining his weight and has no complaints other than shortness of breath and fatigue with exertion.  Past Medical History  Diagnosis Date  . 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 Baker's cyst   . Hypertension   . Heart murmur   . Transfusion history     hx. 25 yrs ago-during cancer tx.  . Testicular seminoma ([place]) 1990    with metatstatic spread - good response to therapy-radiation and chemotherapy  . Lesion of left lung     hx.25 yrs ago- testicilar cancer related- only scarring left after tx.-no problems now  . Hypercholesteremia   . Pulmonary fibrosis ([place]) 11/08/2015    Past Surgical History  Procedure Laterality Date  . Thoracotomy  1990    wedge biopsy of mediastinal mass  . Back surgery      '14- rupt. disc   . Knee surgery      age 54 for Baker's cyst  . Eus N/A 02/10/2014    Procedure: UPPER ENDOSCOPIC ULTRASOUND (EUS) LINEAR;  Surgeon: [name], MD;  Location: [facility] ENDOSCOPY;  Service: Endoscopy;  Laterality: N/A;  . Cardiac catheterization N/A 01/24/2016    Procedure: Right/Left Heart Cath and Coronary Angiography;  Surgeon: [name], MD;  Location: [facility]  INVASIVE CV LAB;  Service: Cardiovascular;  Laterality: N/A;    Family History  Problem Relation Age of Onset  . Dementia Mother   . Cancer Maternal Aunt     colon  . Emphysema Maternal Aunt   . Cancer Paternal Grandfather     esophagus    Social History   Social History  . Marital Status: Married    Spouse Name: N/A  . Number of Children: 3  . Years of Education: 16   Occupational History  . Banker    Social History Main Topics  . Smoking status: Never Smoker   . Smokeless tobacco: Never Used  . Alcohol Use: Yes       Comment: rare- social  . Drug Use: No  . Sexual Activity:    Partners: Female   Other Topics Concern  . Not on file   Social History Narrative   [name]. 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 2 sons: 1979, 1985- married with 2 daughters 1 in route: Oldest in [place], younger [place], [place]. 1 daughter: 82 [place], married-1 granddaughter.Customer service manager- Nurse, mental health. SO- good health; marriage in good health   Enjoys watching movies, works out at home gym    Current Outpatient Prescriptions  Medication Sig Dispense Refill  . aspirin EC 81 MG tablet Take 1 tablet (81 mg total) by mouth daily. 90 tablet 3  . calcium carbonate (TUMS - DOSED IN MG ELEMENTAL CALCIUM) 500 MG chewable tablet Chew 3 tablets by mouth as needed for indigestion or heartburn.    Marland Kitchen ibuprofen (ADVIL,MOTRIN) 200 MG tablet Take 600 mg by mouth every 6 (six) hours as needed for mild pain or moderate pain.     Marland Kitchen losartan (COZAAR) 50 MG tablet Take 1 tablet (50 mg total) by mouth every morning. 90 tablet 3  . Octreotide Acetate (SANDOSTATIN IJ) Inject 1 each as directed every 30 (thirty) days.     . sildenafil (REVATIO) 20 MG tablet Take 20 mg by mouth as needed (for ED).    Marland Kitchen simvastatin (ZOCOR) 20 MG tablet TAKE 1 TABLET (20 MG TOTAL) BY MOUTH AT BEDTIME. 30 tablet 5  . silodosin (RAPAFLO) 8 MG CAPS capsule Take 8 mg by mouth daily with breakfast. Reported on 02/08/2016     No current facility-administered medications for this visit.    Allergies  Allergen Reactions  . Penicillins Hives and Other (See Comments)    Entire body.      Review of Systems:   General:  normal appetite, decreased energy, no weight gain, no weight loss, no fever  Cardiac:  no chest pain with exertion, no chest pain at rest, mild SOB with moderate exertion, no resting SOB, no PND, no orthopnea, no palpitations, no arrhythmia, no atrial fibrillation, no LE edema, no dizzy spells, no syncope  Respiratory:  exertional shortness of breath, no home oxygen, no  productive cough, no dry cough, no bronchitis, no wheezing, no hemoptysis, no asthma, no pain with inspiration or cough, no sleep apnea, no CPAP at night  GI:   no difficulty swallowing, no reflux, no frequent heartburn, no hiatal hernia, no abdominal pain, no constipation, no diarrhea, no hematochezia, no hematemesis, no melena  GU:   no dysuria,  no frequency, no urinary tract infection, no hematuria, no enlarged prostate, no kidney stones, no kidney disease  Vascular:  no pain suggestive of claudication, no pain in feet, no leg cramps, no varicose veins, no DVT, no non-healing foot ulcer  Neuro:   no stroke, no TIA's,  no seizures, no headaches, no temporary blindness one eye,  no slurred speech, no peripheral neuropathy, no chronic pain, no instability of gait, no memory/cognitive dysfunction  Musculoskeletal: no arthritis, no joint swelling, no myalgias, no difficulty walking, no mobility   Skin:   no rash, no itching, no skin infections, no pressure sores or ulcerations  Psych:   no anxiety, no depression, no nervousness, no unusual recent stress  Eyes:   no blurry vision, no floaters, no recent vision changes,  wears glasses or contacts  ENT:   no hearing loss, no loose or painful teeth, no dentures, last saw dentist in past 6 months  Hematologic:  no easy bruising, no abnormal bleeding, no clotting disorder, no frequent epistaxis  Endocrine:  no diabetes, does not check CBG's at home           Physical Exam:   BP 147/80 mmHg  Pulse 78  Resp 20  Ht 5\' 10"  (1.778 m)  Wt 182 lb (82.555 kg)  BMI 26.11 kg/m2  SpO2 97%  General:  well-appearing gentleman in no distress  HEENT:  Unremarkable, NCAT, PERLA, EOMI, oropharynx clear  Neck:   no JVD, no bruits, no adenopathy or thyromegaly  Chest:   clear to auscultation, symmetrical breath sounds, no wheezes, no rhonchi, dilated veins over the left chest and neck.  CV:   RRR, grade III/VI crescendo/decrescendo murmur heard best at RSB,  II/VI  diastolic murmur at apex  Abdomen:  soft, non-tender, no masses or organomegaly  Extremities:  warm, well-perfused, pulses palpable, no LE edema  Rectal/GU  Deferred  Neuro:   Grossly non-focal and symmetrical throughout  Skin:   Clean and dry, no rashes, no breakdown   Diagnostic Tests:   [facility]  [address], [place] [address]  [phone]  ------------------------------------------------------------------- Transthoracic Echocardiography  Patient: Sabien, Makarewicz MR #: [id number] Study Date: 12/06/2015 Gender: M Age: 54 Height: 177.8 cm Weight: 83.8 kg BSA: 2.05 m^2 Pt. Status: Room:  ATTENDING Bishop Limbo REFERRING [name] SONOGRAPHER Cindy Hazy, RDCS PERFORMING [facility], Outpatient  cc:  ------------------------------------------------------------------- LV EF: 60% - 65%  ------------------------------------------------------------------- Indications: I35.0 Aortic Stenosis.  ------------------------------------------------------------------- History: PMH: Acquired from the patient and from the patient&'s chart. PMH: Murmur. Pericarditis. Risk factors: Hypertension. Hypercholesterolemia.  ------------------------------------------------------------------- Study Conclusions  - Left ventricle: The cavity size was normal. Wall thickness was  increased in a pattern of mild LVH. Systolic function was normal.  The estimated ejection fraction was in the range of 60% to 65%.  Doppler parameters are consistent with abnormal left ventricular  relaxation (grade 1 diastolic dysfunction). The E/e&' ratio is  >15, suggesting elevated LV filling pressure. - Aortic valve: Heavily calcified aortic valve with severe aortic  stenosis. There was mild  regurgitation. Mean gradient (S): 39 mm  Hg. Peak gradient (S): 61 mm Hg. Valve area (VTI): 0.98 cm^2.  Valve area (Vmax): 0.85 cm^2. - Mitral valve: Calcified annulus. There was trivial regurgitation. - Left atrium: The atrium was mildly dilated. - Right ventricle: The cavity size was mildly dilated. - Right atrium: The atrium was mildly dilated. - Tricuspid valve: There was trivial regurgitation. - Pulmonary arteries: PA peak pressure: 41 mm Hg (S). - Inferior vena cava: The vessel was dilated. The respirophasic  diameter changes were blunted (< 50%), consistent with elevated  central venous pressure.  Impressions:  - Compared to a prior echo in 2011, there is now severe aortic  stenosis -  the mean gradient is 39 mmHg - calculated AVA is  0.8-0.9 cm2.  Transthoracic echocardiography. M-mode, complete 2D, spectral Doppler, and color Doppler. Birthdate: Patient birthdate: [birth year]. Age: Patient is 60 yr old. Sex: Gender: male. BMI: 26.5 kg/m^2. Blood pressure: 126/80 Patient status: Outpatient. Study date: Study date: 12/06/2015. Study time: 10:43 AM. Location: [facility]  -------------------------------------------------------------------  ------------------------------------------------------------------- Left ventricle: The cavity size was normal. Wall thickness was increased in a pattern of mild LVH. Systolic function was normal. The estimated ejection fraction was in the range of 60% to 65%. Doppler parameters are consistent with abnormal left ventricular relaxation (grade 1 diastolic dysfunction). The E/e&' ratio is >15, suggesting elevated LV filling pressure.  ------------------------------------------------------------------- Aortic valve: Heavily calcified aortic valve with severe aortic stenosis. Doppler: There was mild regurgitation. VTI ratio of LVOT to aortic valve: 0.31. Valve area (VTI): 0.98 cm^2. Indexed valve area  (VTI): 0.48 cm^2/m^2. Peak velocity ratio of LVOT to aortic valve: 0.27. Valve area (Vmax): 0.85 cm^2. Indexed valve area (Vmax): 0.42 cm^2/m^2. Mean velocity ratio of LVOT to aortic valve: 0.29. Valve area (Vmean): 0.91 cm^2. Indexed valve area (Vmean): 0.44 cm^2/m^2. Mean gradient (S): 39 mm Hg. Peak gradient (S): 61 mm Hg.  ------------------------------------------------------------------- Aorta: Aortic root: The aortic root was normal in size. Ascending aorta: The ascending aorta was normal in size.  ------------------------------------------------------------------- Mitral valve: Calcified annulus. Doppler: There was trivial regurgitation. Peak gradient (D): 9 mm Hg.  ------------------------------------------------------------------- Left atrium: The atrium was mildly dilated.  ------------------------------------------------------------------- Atrial septum: No defect or patent foramen ovale was identified.  ------------------------------------------------------------------- Right ventricle: The cavity size was mildly dilated. Systolic function was normal.  ------------------------------------------------------------------- Pulmonic valve: The valve appears to be grossly normal. Doppler: There was no significant regurgitation.  ------------------------------------------------------------------- Tricuspid valve: Doppler: There was trivial regurgitation.  ------------------------------------------------------------------- Pulmonary artery: Poorly visualized.  ------------------------------------------------------------------- Right atrium: The atrium was mildly dilated.  ------------------------------------------------------------------- Pericardium: There was no pericardial effusion.  ------------------------------------------------------------------- Systemic veins: Inferior vena cava: The vessel was dilated. The  respirophasic diameter changes were blunted (< 50%), consistent with elevated central venous pressure.  ------------------------------------------------------------------- Measurements  Left ventricle Value Reference LV ID, ED, PLAX chordal (L) 35.7 mm 43 - 52 LV ID, ES, PLAX chordal 24.6 mm 23 - 38 LV fx shortening, PLAX chordal 31 % >=29 LV PW thickness, ED 12.4 mm --------- IVS/LV PW ratio, ED 1.27 <=1.3 Stroke volume, 2D 81 ml --------- Stroke volume/bsa, 2D 40 ml/m^2 --------- LV e&', lateral 6.95 cm/s --------- LV E/e&', lateral 21.15 --------- LV e&', medial 5.07 cm/s --------- LV E/e&', medial 28.99 --------- LV e&', average 6.01 cm/s --------- LV E/e&', average 24.46 ---------  Ventricular septum Value Reference IVS thickness, ED 15.7 mm ---------  LVOT Value Reference LVOT ID, S 20 mm --------- LVOT area 3.14 cm^2 --------- LVOT ID 20 mm --------- LVOT peak velocity, S 105 cm/s --------- LVOT mean velocity, S 85.7 cm/s --------- LVOT VTI, S 25.9 cm --------- Stroke volume (SV), LVOT DP 81.4 ml --------- Stroke index (SV/bsa), LVOT DP 39.7 ml/m^2 ---------  Aortic  valve Value Reference Aortic valve peak velocity, S 390 cm/s --------- Aortic valve mean velocity, S 297 cm/s --------- Aortic valve VTI, S 82.8 cm --------- Aortic mean gradient, S 39 mm Hg --------- Aortic peak gradient, S 61 mm Hg --------- VTI ratio, LVOT/AV 0.31 --------- Aortic valve area, VTI 0.98 cm^2 --------- Aortic valve area/bsa, VTI 0.48 cm^2/m^2 --------- Velocity ratio, peak, LVOT/AV 0.27 --------- Aortic valve area, peak velocity 0.85 cm^2 --------- Aortic valve area/bsa,  peak 0.42 cm^2/m^2 --------- velocity Velocity ratio, mean, LVOT/AV 0.29 --------- Aortic valve area, mean velocity 0.91 cm^2 --------- Aortic valve area/bsa, mean 0.44 cm^2/m^2 --------- velocity Aortic regurg pressure half-time 308 ms ---------  Aorta Value Reference Aortic root ID, ED 34 mm ---------  Left atrium Value Reference LA ID, A-P, ES 39 mm --------- LA ID/bsa, A-P 1.9 cm/m^2 <=2.2 LA volume, S 72.8 ml --------- LA volume/bsa, S 35.6 ml/m^2 --------- LA volume, ES, 1-p A4C 61 ml --------- LA volume/bsa, ES, 1-p A4C 29.8 ml/m^2 --------- LA volume, ES, 1-p A2C 85.2 ml --------- LA volume/bsa, ES, 1-p A2C 41.6 ml/m^2 ---------  Mitral valve Value  Reference Mitral E-wave peak velocity 147 cm/s --------- Mitral A-wave peak velocity 157 cm/s --------- Mitral deceleration time (H) 299 ms 150 - 230 Mitral peak gradient, D 9 mm Hg --------- Mitral E/A ratio, peak 0.9 ---------  Pulmonary arteries Value Reference PA pressure, S, DP (H) 41 mm Hg <=30  Tricuspid valve Value Reference Tricuspid regurg peak velocity 256 cm/s --------- Tricuspid peak RV-RA gradient 26 mm Hg ---------  Right ventricle Value Reference TAPSE 27 mm --------- RV s&', lateral, S 22.6 cm/s ---------  Legend: (L) and (H) mark values outside specified reference range.  ------------------------------------------------------------------- Prepared and Electronically Authenticated by  Lyman Bishop MD 2017-05-03T15:58:12  [name], MD (Primary)      Procedures    Right/Left Heart Cath and Coronary Angiography    Conclusion    1. Known severe aortic stenosis with heavy calcification of the aortic valve, and 3+ aortic insufficiency by aortography 2. Widely patent coronary arteries (left-dominant) 3. Elevated right-sided intracardiac pressures  Recommendations: cardiac surgical consultation for continued evaluation of treatment options for severe symptomatic aortic stenosis    Indications    Severe aortic stenosis [I35.0 (ICD-10-CM)]    Technique and Indications    INDICATION: Severe aortic stenosis  PROCEDURAL DETAILS: Initially, right heart catheterization is attempted through the left antecubital vein. There was an indwelling IV and this was changed out for a 5/6 French  slender sheath without difficulty. I was not able to access the central venous circulation. A venogram is performed and the left subclavian vein is occluded with collaterals. Attention is then turned to the right groin. The right groin was prepped, draped, and anesthetized with 1% lidocaine. Using the modified Seldinger technique a 5 French sheath was placed in the right femoral artery and a 7 French sheath was placed in the right femoral vein. A Swan-Ganz catheter was used for the right heart catheterization. Standard protocol was followed for recording of right heart pressures and sampling of oxygen saturations. Fick cardiac output was calculated. Standard Judkins catheters were used for selective coronary angiography and aortic root angiography. There were no immediate procedural complications. The patient was transferred to the post catheterization recovery area for further monitoring.  During this procedure the patient is administered a total of Versed 2 mg and Fentanyl 25 mg to achieve and maintain moderate conscious sedation. The patient's heart rate, blood pressure, and oxygen saturation are monitored continuously during the procedure. The period of conscious sedation is 54 minutes, of which I was present face-to-face 100% of this time. Estimated blood loss <50 mL. There were no immediate complications during the procedure.    Coronary Findings    Dominance: Left   Left Anterior Descending  The LAD has no significant obstructive disease. The vessel has mild calcification. It is patent to the LV apex.     Ramus Intermedius  Medium caliber  vessel without significant stenosis.     Left Circumflex  The left circumflex is a large, dominant vessel. The vessel is patent throughout without stenosis. The obtuse marginal and left PDA branches are patent.     Right Coronary Artery  Medium caliber, nondominant right coronary artery without stenosis.       Right Heart Pressures Hemodynamic  findings consistent with mild pulmonary hypertension. LV EDP is normal. Mild pulmonary HTN with transpulmonic gradient 7 mmHg, PVR 2 Woods Units, suggestive of left heart disease    Left Heart    Aortic Valve There is severe aortic valve stenosis, and moderate (3+) aortic regurgitation. The aortic valve is calcified. There is restricted aortic valve motion. The aortic valve is severely calcified. There is bulky calcifications in the aortic valve leaflets and annulus extending into the inter-valvular fibrosa    Coronary Diagrams    Diagnostic Diagram            Implants     No implant documentation for this case.    PACS Images    Show images for Cardiac catheterization     Link to Procedure Log    Procedure Log      Hemo Data       Most Recent Value   Fick Cardiac Output  3.44 L/min   Fick Cardiac Output Index  1.73 (L/min)/BSA   RA A Wave  15 mmHg   RA V Wave  12 mmHg   RA Mean  12 mmHg   RV Systolic Pressure  58 mmHg   RV Diastolic Pressure  7 mmHg   RV EDP  14 mmHg   PA Systolic Pressure  46 mmHg   PA Diastolic Pressure  15 mmHg   PA Mean  28 mmHg   PW A Wave  22 mmHg   PW V Wave  24 mmHg   PW Mean  21 mmHg   AO Systolic Pressure  [id number] mmHg   AO Diastolic Pressure  75 mmHg   AO Mean  99 mmHg   QP/QS  1   TPVR Index  16.19 HRUI   TSVR Index  57.23 HRUI   PVR SVR Ratio  0.08   TPVR/TSVR Ratio  0.28   ADDENDUM REPORT: 01/18/2016 18:36 CLINICAL DATA: 60 year old male with severe aortic stenosis for TAVR evaluation. EXAM: Cardiac TAVR CT TECHNIQUE: The patient was scanned on a Philips 256 scanner. A 120 kV retrospective scan was triggered in the descending thoracic aorta at 111 HU's. Gantry rotation speed was 270 msecs and collimation was .9 mm. No beta blockade or nitro were given. The 3D data set was reconstructed in 5% intervals of the R-R cycle. Systolic and diastolic phases were analyzed on a dedicated work station  using MPR, MIP and VRT modes. The patient received 80 cc of contrast. FINDINGS: Aortic Valve: Trileaflet with severe extensive calcifications of all of the leaflets with severe subvalvular calcifications extending into the LVOT predominantly under the left coronary leaflet and continuing into the mitral annular calcifications. Aorta: Normal caliber with mild mild diffuse calcifications in the aortic arch and no dissection. Sinotubular Junction: 28 x 27 mm Ascending Thoracic Aorta: 29 x 28 mm Aortic Arch: 26 x 24 mm Descending Thoracic Aorta: 22 x 22 mm Sinus of Valsalva Measurements: Non-coronary: 35 mm Right -coronary: 33 mm Left -coronary: 32 mm Coronary Artery Height above Annulus: Left Main: 16 mm Right Coronary: 18 mm Virtual Basal Annulus Measurements: Maximum/Minimum Diameter: 28 x 24 mm Perimeter: 82 mm Area:  502 mm2 Coronary Arteries: Calcium score of 9 that represents 32 percentile for age/sex. Coronary evaluation is suboptimal however: Normal coronary origin with left dominance. Left main is a large vessel with no stenosis. LAD is a a large and long vessel that wraps around the apex and gives rise to 2 diagonal branches. There is mild calcified plaque in the proximal LAD with associated stenosis 0-25%. LCX is a large dominant vessel that gives rise to two obtuse marginal branches and PDA. There is no significant stenosis. RCA is a small non-dominant artery that is poorly visualized. Optimum Fluoroscopic Angle for Delivery: LAO 5 CAU 5 Other findings: Dilated pulmonary artery measuring 35 x 27 mm suggestive of pulmonary hypertension. Significantly dilated right atrium and ventricle. Norma pulmonary vein drainage into the left atrium. No thrombus in the left atrial appendage. No ASD/VSD. IMPRESSION: 1. Aortic valve is trileaflet with extensive calcifications of all three leaflets with asymmetric subvalvular calcifications predominantly under the  left coronary cusp extending into LVOT and MAC. Because of severe calcifications the annular sizing is challenging, however it appears the the annulus is suitable for delivery of a 26 mm Raidyn-SAPIEN TAVR valve. 2. There is sufficient annulus to coronary distance. 3. There is normal coronary origin with left dominance and only mild non-obstructive CAD in the LAD. 4. Optimum Fluoroscopic Angle for Delivery: LAO 5 CAU 5 5. Dilated right heart chambers and pulmonary artery are consistent with pulmonary hypertension. Interventricular septal flattening during diastole is consistent with RV fluid overload. Ena Dawley Electronically Signed  By: Ena Dawley  On: 01/18/2016 18:36     Study Result     EXAM: OVER-READ INTERPRETATION CT CHEST  The following report is an over-read performed by radiologist Dr. Rebekah [facility] on 01/17/2016. This over-read does not include interpretation of cardiac or coronary anatomy or pathology. The coronary calcium score/coronary CTA interpretation by the cardiologist is attached.  COMPARISON: Chest CT 02/04/2014.  FINDINGS: Extracardiac findings will be described separately under dictation for contemporaneously obtained CTA of the chest, abdomen and pelvis dated 01/17/2016.  IMPRESSION: Please see separate dictation for contemporaneously obtained CTA of the chest, abdomen and pelvis for full description of extracardiac findings.  Electronically Signed: By: Vinnie Langton M.D. On: 01/17/2016 11:09   CLINICAL DATA: 60 year old male with history of severe attics stenosis. Preprocedural study prior to potential transcatheter aortic valve replacement (TAVR) procedure.  EXAM: CT ANGIOGRAPHY CHEST, ABDOMEN AND PELVIS  TECHNIQUE: Multidetector CT imaging through the chest, abdomen and pelvis was performed using the standard protocol during bolus administration of intravenous contrast.  Multiplanar reconstructed images and MIPs were obtained and reviewed to evaluate the vascular anatomy.  CONTRAST: 70 mL of Isovue 370.  COMPARISON: CT the abdomen and pelvis 08/30/2015. Chest CT 02/04/2014.  FINDINGS: CTA CHEST FINDINGS  Mediastinum/Lymph Nodes: Heart size is enlarged with right atrial and right ventricular dilatation. There is some bowing of the interventricular septum from right to left, which could suggest elevated right-sided heart pressures. Pericardial thickening calcification is noted, most evident overlying the right ventricular outflow tract, pulmonic valve and proximal pulmonic trunk. No other pericardial calcifications or additional areas of pericardial thickening are noted. There is atherosclerosis of the thoracic aorta, the great vessels of the mediastinum and the coronary arteries, including calcified atherosclerotic plaque in the left anterior descending coronary artery. Severe calcifications of the aortic valve. In addition, there are severe calcifications of the mitral-aortic intervalvular fibrosa, mitral annulus and anterior leaflet of the mitral valve. No pathologically enlarged mediastinal  or hilar lymph nodes. Left axillary vein appears diminutive. No left subclavian or left innominate vein of significant caliber is identified on today's examination. Innumerable small collateral veins are noted in the left axillary region, lower cervical regions bilaterally, and involving the azygos/hemiazygous system. Esophagus is unremarkable in appearance. No axillary lymphadenopathy.  Lungs/Pleura: 6 mm subpleural nodule in the periphery of the lateral segment of the right middle lobe (image 47 of series 507) is similar to prior examination 08/30/2015, likely a subpleural lymph node. 2 mm left lower lobe pulmonary nodule (image 34 of series 507), unchanged dating back to 02/04/2014, considered benign. No other larger more suspicious appearing  pulmonary nodules or masses are noted. Extensive septal thickening and architectural distortion in the left upper lobe, similar to prior study 02/04/2014, presumably either post infectious/inflammatory scarring, or related to remote radiation therapy. No acute consolidative airspace disease. No pleural effusions. Chronic elevation of left hemidiaphragm is unchanged.  Musculoskeletal/Soft Tissues: There are no aggressive appearing lytic or blastic lesions noted in the visualized portions of the skeleton.  CTA ABDOMEN AND PELVIS FINDINGS  Hepatobiliary: Again noted are numerous heterogeneously enhancing lesions scattered throughout the liver, compatible with widespread metastatic disease. The largest of these is in the periphery of segment 7 of the liver (image 289 of series 502) which currently measures 5.2 x 4.1 cm (previously 4.3 x 3.4 cm on prior 08/30/2015. Several other lesions have clearly progressed compared the prior study, including a very ill-defined lesion between segments 5 and 6 on image 367 of series 502 which currently measures 4.8 x 3.4 cm (previously 4.1 x 2.7 cm on 09/01/2015). Additional example of progression is a 3.4 x 2.4 cm lesion in segment 4B adjacent to the falciform ligament (image 350 of series 502) which previously measured only 2.9 x 2.1 cm. No intra or extrahepatic biliary ductal dilatation. Partially calcified gallstone in the fundus of the gallbladder measuring 3.0 cm in diameter. Gallbladder is partially contracted. No pericholecystic fluid or surrounding inflammatory changes.  Pancreas: The primary lesion in the head of the pancreas appears larger than the prior study, currently measuring 2.4 x 1.7 cm (image 192 of series 501), previously 1.4 cm on 08/30/2015. No pancreatic ductal dilatation. No pancreatic or peripancreatic fluid or inflammatory changes.  Spleen: Unremarkable.  Adrenals/Urinary Tract: Several sub cm low-attenuation  lesions in the kidneys bilaterally too small to definitively characterize, but are statistically likely to represent cysts. 3.9 cm simple cyst in the medial aspect of the interpolar region of the right kidney. No hydroureteronephrosis. Bilateral adrenal glands are normal in appearance. Urinary bladder is normal in appearance.  Stomach/Bowel: The appearance of the stomach is normal. There is no pathologic dilatation of small bowel or colon. The appendix is not confidently identified and may be surgically absent. Regardless, there are no inflammatory changes noted adjacent to the cecum to suggest the presence of an acute appendicitis at this time.  Vascular/Lymphatic: Atherosclerosis throughout the abdominal and pelvic vasculature, without evidence of aneurysm or dissection. Vascular findings and measurements pertinent to potential TAVR procedure, as detailed below. Dilated intrahepatic portion of the IVC which measures up to 3.4 cm in diameter. No lymphadenopathy noted in the abdomen or pelvis, although portacaval lymph nodes are borderline enlarged measuring up to 11 mm in short axis.  Reproductive: Prostate gland and seminal vesicles are unremarkable in appearance. Bilateral hydroceles are incompletely visualized in the upper scrotum.  Other: No significant volume of ascites. No pneumoperitoneum.  Musculoskeletal: 13 mm sclerotic lesion in the left  ilium just lateral to the left sacroiliac joint, similar to prior studies dating back to at least 02/04/2014, presumably benign. There are no aggressive appearing lytic or blastic lesions noted in the visualized portions of the skeleton.  VASCULAR MEASUREMENTS PERTINENT TO TAVR:  AORTA:  Minimal Aortic Diameter - 15 x 18 mm  Severity of Aortic Calcification - mild  RIGHT PELVIS:  Right Common Iliac Artery -  Minimal Diameter - 9.0 x 10.5 mm  Tortuosity - moderate  Calcification - mild  Right External  Iliac Artery -  Minimal Diameter - 8.3 x 10.3 mm  Tortuosity - severe  Calcification - mild  Right Common Femoral Artery -  Minimal Diameter - 9.9 x 9.6 mm  Tortuosity - mild  Calcification - none  LEFT PELVIS:  Left Common Iliac Artery -  Minimal Diameter - 11.2 x 9.8 mm  Tortuosity - moderate  Calcification - mild  Left External Iliac Artery -  Minimal Diameter - 9.9 x 8.1 mm  Tortuosity - severe  Calcification - mild  Left Common Femoral Artery -  Minimal Diameter - 9.7 x 7.8 mm  Tortuosity - mild  Calcification - mild  Review of the MIP images confirms the above findings.  IMPRESSION: 1. Vascular findings and measurements pertinent to potential TAVR procedure, as detailed above. This patient does appear to have spot suitable pelvic arterial access bilaterally, however, the external iliac arteries are extremely tortuous bilaterally. The degree of tortuosity appears less severe on the left side. Unfortunately, there is only minimal calcified atherosclerotic plaque throughout the pelvic arterial access vessels. 2. Severe thickening and calcification of the aortic valve, compatible with the reported clinical history of severe aortic stenosis. There is also very severe calcification of the mitral-aortic intervalvular fibrosa, anterior leaflet of the mitral valve and superior aspect of the mitral annulus, which have implications for potential TAVR procedure. 3. Widespread metastatic disease to the liver again noted, as above. This has clearly progressed compared to the prior study from 08/30/2015. 4. 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 5. Right atrial and right ventricular dilatation, reflux of contrast into the IVC (which appears dilated), and bowing of the interventricular septum from right to left, concerning  for elevated right-sided heart pressures. 6. Pericardial calcification most evident anteriorly overlying the right ventricular outflow tract, pulmonic valve and proximal pulmonic trunk, potentially related to prior mediastinal radiation therapy. 7. Fibrotic changes in the left upper lobe, similar to prior study, also potentially related to prior radiation therapy, or potentially post infectious scarring. 8. Additional incidental findings, as above.   Electronically Signed  By: Vinnie Langton M.D.  On: 01/17/2016 12:56    Ref Range 71mo ago    FVC-Pre L 2.45P   FVC-%Pred-Pre % 52P   FVC-Post L 2.51P   FVC-%Pred-Post % 53P   FVC-%Change-Post % 2P   FEV1-Pre L 1.83P   FEV1-%Pred-Pre % 51P   FEV1-Post L 1.95P   FEV1-%Pred-Post % 55P   FEV1-%Change-Post % 6P   FEV6-Pre L 2.43P   FEV6-%Pred-Pre % 54P   FEV6-Post L 2.50P   FEV6-%Pred-Post % 55P   FEV6-%Change-Post % 2P   Pre FEV1/FVC ratio % 75P   FEV1FVC-%Pred-Pre % 99P   Post FEV1/FVC ratio % 78P   FEV1FVC-%Change-Post % 3P   Pre FEV6/FVC Ratio % 99P   FEV6FVC-%Pred-Pre % 104P   Post FEV6/FVC ratio % 99P   FEV6FVC-%Pred-Post %  104P   FEV6FVC-%Change-Post % 0P   FEF 25-75 Pre L/sec 1.47P   FEF2575-%Pred-Pre % 51P   FEF 25-75 Post L/sec 1.86P   FEF2575-%Pred-Post % 64P   FEF2575-%Change-Post % 27P   DLCO unc ml/min/mmHg 21.62P   DLCO unc % pred % 66P   DLCO cor ml/min/mmHg 21.16P   DLCO cor % pred % 65P   DL/VA ml/min/mmHg/L 4.76P   DL/VA % pred % 102P   Resulting Agency          STS Risk Calculator: Procedure: AV Replacement  Risk of Mortality: 1.794%  Morbidity or Mortality: 13.847%  Long Length of Stay: 6.066%  Short Length of Stay: 50.165%  Permanent Stroke: 0.59%  Prolonged Ventilation: 6.612%  DSW Infection: 0.25%  Renal Failure: 2.81%  Reoperation: 6.218%    Impression:  This patient is a 60 year old gentleman with stage D, severe, symptomatic  aortic stenosis with exertional fatigue and shortness of breath that has been slowly progressing. He has a history of extensive XRT to the chest in 1990 with some interstitial fibrosis and chronic dyspnea since but he is clearly worsening. His PFT's show a moderate restrictive defect. I have personally reviewed his echo, cath, and CTA studies. His aortic valve is trileaflet and very calcified with calcium extending down to the aorto-mitral curtain. This is likely due to his previous XRT. His mean gradient is 39 mm Hg with poor leaflet motion consistent with severe AS and there is moderate AI. 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 The liver lesions have also enlarged since then. Dr. Burt Knack has discussed this with his oncologist who feels that he has a survival prognosis of around 5 years with this slowly progressive tumor that is being treated with Sandostatin. With his severe AS and progressive symptoms I think it is reasonable to consider AVR given his 5 year prognosis. I think he will likely continue to deteriorate over the next year if this is not treated surgically. 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 These factors are not included in his STS PROM of 1.8%. I think TAVR would be a reasonable alternative for this patient with a limited lifespan under the above situation even though he is only 74. His cardiac CT shows that he has anatomy suitable for a Sapien 3 valve although he has bulky calcification extending down from the aortic annulus to the aorto-mitral curtain which would increase the risk of paravalvular leak. He already has 3+ AI through his native valve though. His abdominal  and pelvic CT show adequate vessels for transfemoral access.   The patient and his wife were counseled at length regarding treatment alternatives for management of severe symptomatic aortic stenosis. Alternative approaches such as conventional aortic valve replacement, transcatheter aortic valve replacement, and palliative medical therapy were compared and contrasted at length. The risks associated with conventional surgical aortic valve replacement were been discussed in detail, as were expectations for post-operative convalescence. Long-term prognosis with medical therapy was discussed. We discussed complications that might develop including but not limited to risks of death, stroke, paravalvular leak, aortic dissection or other major vascular complications, aortic annulus rupture, device embolization, cardiac rupture or perforation, mitral regurgitation, acute myocardial infarction,  arrhythmia, heart block or bradycardia requiring permanent pacemaker placement, congestive heart failure, respiratory failure, renal failure, pneumonia, infection, other late complications related to structural valve deterioration or migration, or other complications that might ultimately cause a temporary or permanent loss of functional independence or other long term morbidity. He would like to proceed with TAVR if we feel that he is a candidate but would like to wait until September to get some things situated with his family.   Plan:  He will return for a second surgical evaluation with Dr. Roxy Manns and if felt to be an acceptable candidate by the multidisciplinary team he will be scheduled for transfemoral TAVR in September.    Gaye Pollack, MD 02/07/2016

## 2016-02-21 ENCOUNTER — Other Ambulatory Visit: Payer: Self-pay | Admitting: *Deleted

## 2016-02-21 DIAGNOSIS — D3A8 Other benign neuroendocrine tumors: Secondary | ICD-10-CM

## 2016-02-24 ENCOUNTER — Telehealth: Payer: Self-pay | Admitting: Oncology

## 2016-02-24 NOTE — Telephone Encounter (Signed)
Lvm advising lab appt 7/27 @ 9.15am.

## 2016-02-29 ENCOUNTER — Other Ambulatory Visit (HOSPITAL_BASED_OUTPATIENT_CLINIC_OR_DEPARTMENT_OTHER): Payer: BLUE CROSS/BLUE SHIELD

## 2016-02-29 ENCOUNTER — Ambulatory Visit (HOSPITAL_COMMUNITY)
Admission: RE | Admit: 2016-02-29 | Discharge: 2016-02-29 | Disposition: A | Discharge status: Home / Self Care | Payer: BLUE CROSS/BLUE SHIELD | Source: Ambulatory Visit | Attending: Oncology | Admitting: Oncology

## 2016-02-29 ENCOUNTER — Encounter (HOSPITAL_COMMUNITY): Payer: Self-pay

## 2016-02-29 DIAGNOSIS — C7B8 Other secondary neuroendocrine tumors: Secondary | ICD-10-CM

## 2016-02-29 DIAGNOSIS — K869 Disease of pancreas, unspecified: Secondary | ICD-10-CM | POA: Diagnosis not present

## 2016-02-29 DIAGNOSIS — C7A8 Other malignant neuroendocrine tumors: Secondary | ICD-10-CM | POA: Diagnosis not present

## 2016-02-29 DIAGNOSIS — D3A8 Other benign neuroendocrine tumors: Secondary | ICD-10-CM

## 2016-02-29 DIAGNOSIS — I350 Nonrheumatic aortic (valve) stenosis: Secondary | ICD-10-CM | POA: Diagnosis not present

## 2016-02-29 DIAGNOSIS — C787 Secondary malignant neoplasm of liver and intrahepatic bile duct: Secondary | ICD-10-CM | POA: Insufficient documentation

## 2016-02-29 DIAGNOSIS — K8020 Calculus of gallbladder without cholecystitis without obstruction: Secondary | ICD-10-CM | POA: Insufficient documentation

## 2016-02-29 LAB — BASIC METABOLIC PANEL
ANION GAP: 10 meq/L (ref 3–11)
BUN: 16.3 mg/dL (ref 7.0–26.0)
CALCIUM: 9.7 mg/dL (ref 8.4–10.4)
CO2: 29 mEq/L (ref 22–29)
Chloride: 102 mEq/L (ref 98–109)
Creatinine: 1 mg/dL (ref 0.7–1.3)
EGFR: 78 mL/min/{1.73_m2} — AB (ref >90)
Glucose: 97 mg/dl (ref 70–140)
POTASSIUM: 4.4 meq/L (ref 3.5–5.1)
SODIUM: 140 meq/L (ref 136–145)

## 2016-02-29 MED ORDER — IOPAMIDOL (ISOVUE-300) INJECTION 61%
100.0000 mL | Freq: Once | INTRAVENOUS | Status: AC | PRN
Start: 2016-02-29 — End: 2016-02-29
  Administered 2016-02-29: 100 mL via INTRAVENOUS

## 2016-03-01 ENCOUNTER — Other Ambulatory Visit: Payer: BLUE CROSS/BLUE SHIELD

## 2016-03-01 ENCOUNTER — Ambulatory Visit (HOSPITAL_BASED_OUTPATIENT_CLINIC_OR_DEPARTMENT_OTHER): Payer: BLUE CROSS/BLUE SHIELD

## 2016-03-01 ENCOUNTER — Ambulatory Visit (HOSPITAL_BASED_OUTPATIENT_CLINIC_OR_DEPARTMENT_OTHER): Payer: BLUE CROSS/BLUE SHIELD | Admitting: Oncology

## 2016-03-01 ENCOUNTER — Telehealth: Payer: Self-pay | Admitting: Oncology

## 2016-03-01 VITALS — BP 147/62 | HR 81 | Temp 97.8°F | Resp 19 | Ht 70.0 in | Wt 184.1 lb

## 2016-03-01 DIAGNOSIS — R0609 Other forms of dyspnea: Secondary | ICD-10-CM

## 2016-03-01 DIAGNOSIS — C7A8 Other malignant neuroendocrine tumors: Secondary | ICD-10-CM

## 2016-03-01 DIAGNOSIS — R61 Generalized hyperhidrosis: Secondary | ICD-10-CM

## 2016-03-01 DIAGNOSIS — R5381 Other malaise: Secondary | ICD-10-CM

## 2016-03-01 DIAGNOSIS — C7B8 Other secondary neuroendocrine tumors: Secondary | ICD-10-CM

## 2016-03-01 DIAGNOSIS — D3A8 Other benign neuroendocrine tumors: Secondary | ICD-10-CM

## 2016-03-01 DIAGNOSIS — I350 Nonrheumatic aortic (valve) stenosis: Secondary | ICD-10-CM | POA: Diagnosis not present

## 2016-03-01 MED ORDER — OCTREOTIDE ACETATE 30 MG IM KIT
30.0000 mg | PACK | Freq: Once | INTRAMUSCULAR | Status: AC
  Administered 2016-03-01: 30 mg via INTRAMUSCULAR
  Filled 2016-03-01: qty 1

## 2016-03-01 NOTE — Progress Notes (Signed)
[facility] OFFICE PROGRESS NOTE   Diagnosis: Pancreatic neuroendocrine tumor  INTERVAL HISTORY:   [name] returns as scheduled. He continues monthly Sandostatin. He is scheduled for a TAVR procedure on 04/16/2016. He reports increased malaise and exertional dyspnea. He continues strength exercises, but he cannot do cardiovascular exercise. Mr.Ronald Thompson reports night sweats several times per week. No diarrhea. No pain.  Objective:  Vital signs in last 24 hours:  Blood pressure (!) 147/62, pulse 81, temperature 97.8 F (36.6 C), temperature source Oral, resp. rate 19, height 5\' 10"  (1.778 m), weight 184 lb 1.6 oz (83.5 kg), SpO2 95 %.    HEENT: Neck without mass Lymphatics: No cervical, supraclavicular, axillary, or inguinal nodes Resp: Lungs clear bilaterally Cardio: Regular rate and rhythm, 2/6 systolic murmur GI: No hepatomegaly, nontender Vascular: No leg edema   Lab Results:  Lab Results  Component Value Date   WBC 6.2 01/12/2016   HGB 15.7 01/12/2016   HCT 45.4 01/12/2016   MCV 91.3 01/12/2016   PLT 196 01/12/2016   NEUTROABS 6.5 11/08/2015      Imaging:  Ct Abdomen Pelvis W Contrast  Result Date: 02/29/2016 CLINICAL DATA:  60 year old male with history of pancreatic cancer diagnosed in July 2015. Followup study. EXAM: CT ABDOMEN AND PELVIS WITH CONTRAST TECHNIQUE: Multidetector CT imaging of the abdomen and pelvis was performed using the standard protocol following bolus administration of intravenous contrast. CONTRAST:  142mL ISOVUE-300 IOPAMIDOL (ISOVUE-300) INJECTION 61% COMPARISON:  Multiple priors, most recently CTA of the chest, abdomen and pelvis 01/17/2016 and CT of the abdomen and pelvis 08/30/2015. FINDINGS: Lower chest: 6 mm subpleural nodule in the lateral segment of the right middle lobe (image 229 of series 7), unchanged compared to prior studies dating back to 02/04/2014, presumably a subpleural lymph node. No other larger more suspicious  appearing pulmonary nodules or masses are noted. Areas of architectural distortion and chronic scarring are again noted, particularly in the left lung base. Cardiomegaly. Severe calcifications of the aortic valve. Calcifications of the mitral annulus. Atherosclerotic calcifications in the left anterior descending coronary artery. Hepatobiliary: Due to substantial differences in bolus timing between today's examination and CTA of the chest, abdomen and pelvis from 01/17/2016, for purposes of evaluation of the multifocal hepatic metastases, direct comparison is made with the more remote prior CT of the abdomen and pelvis 08/30/2015. This demonstrates general enlargement of the lesions compared to 08/30/2015. Specific examples include a large lesions centered predominantly in segment 7 (image 2 of series 6) which currently measures 5.4 x 4.7 cm and previously measured 3.9 x 3.5 cm on 08/30/2015. There is also a lesion in the medial aspect of segment 7 (image 6 of series 6) which currently measures 3.5 x 2.7 cm and previously measured 2.9 x 2.3 cm on 08/30/2015. Prominent lesion in the inferior aspect of the right lobe of the liver between segments 5 and 6 (image 19 of series 6) currently measures 4.4 x 3.3 cm, previously 4.1 x 2.6 cm on 08/30/2015. No definite new hepatic lesions are noted. No intra or extrahepatic biliary ductal dilatation. 3.2 cm gallstone in the gallbladder. No findings to suggest an acute cholecystitis at this time. Pancreas: Previously noted hypovascular lesion in the head of the pancreas appears slightly increased compared to prior examinations, currently measuring 2.6 x 2.1 cm (image 112 of series 4) as compared with 1.7 x 2.4 cm on 01/17/2016. No associated pancreatic ductal dilatation. No pancreatic or peripancreatic fluid or inflammatory changes. Spleen: Unremarkable. Adrenals/Urinary Tract:  3.9 cm simple cyst in the medial aspect of the interpolar region of the right kidney. Exophytic sub cm  low-attenuation lesion in the interpolar region of the left kidney, as well as multiple other smaller sub cm lesions throughout the kidneys bilaterally are too small to definitively characterize, but are favored to represent tiny cysts. Bilateral adrenal glands are normal in appearance. No hydroureteronephrosis. Urinary bladder is normal in appearance. Stomach/Bowel: The appearance of the stomach is normal. There is no pathologic dilatation of small bowel or colon. The appendix is not confidently identified and may be surgically absent. Regardless, there are no inflammatory changes noted adjacent to the cecum to suggest the presence of an acute appendicitis at this time. Vascular/Lymphatic: Aortic atherosclerosis, without evidence of aneurysm or dissection in the abdominal or pelvic vasculature. Borderline enlarged 8 mm peripancreatic lymph node (image 116 of series 4) is noted, but nonspecific. No lymphadenopathy noted in the abdomen or pelvis. Reproductive: Prostate gland and seminal vesicles are unremarkable in appearance. Other: No significant volume of ascites.  No pneumoperitoneum. Musculoskeletal: 13 mm sclerotic lesion in the left ilium just lateral to the left sacroiliac joint is well-defined and similar to prior studies dating back to 02/04/2014, presumably benign. There are no aggressive appearing lytic or blastic lesions noted in the visualized portions of the skeleton. IMPRESSION: 1. Today's study demonstrates slight progression of disease, as evidenced by a interval enlargement of the hypovascular mass in the head of the pancreas, as well as enlargement of numerous hepatic metastases. No new metastatic lesions are noted in the liver or elsewhere in the abdomen or pelvis at this time. 2. Severe calcifications of the aortic valve, compatible with the reported clinical history of severe aortic stenosis. 3. Cholelithiasis without evidence of acute cholecystitis at this time. 4. Additional incidental  findings, similar prior studies, as above. Electronically Signed   By: Vinnie Langton M.D.   On: 02/29/2016 15:13 Images were reviewed with Mr. Cipollone  Medications: I have reviewed the patient's current medications.  Assessment/Plan: 1. Pancreatic neuroendocrine tumor, WHO grade 2, pancreatic head mass and small peripancreatic/celiac nodes on a CT 02/04/2014  EUS revealed evidence of multiple liver metastases-status post an FNA biopsy of a left liver lesion 02/10/2014 confirming a neuroendocrine tumor   Octreotide scan 04/01/2014 with multiple foci of metastatic neuroendocrine tumor in the liver  Monthly Sandostatin started 03/16/2014  Restaging CT 07/04/2014 with resolution of a previously noted pancreas head lesion and probable progression of liver metastases  Restaging CT 10/31/2014-stable liver lesions, no evidence of a pancreas mass, stable.  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 chronic exertional dyspnea following treatment for the seminoma  5. aortic stenosis on an echocardiogram December 2011, severe aortic stenosis on echocardiogram 12/06/2015, scheduled for TAVR on 04/16/2016 6. lumbar disc surgery 2014  7. acute abdominal pain 02/04/2014-resolved     Disposition:  Mr.Lamia has progressive malaise and exertional dyspnea. The symptoms are likely related to the severe aortic stenosis. He is scheduled for a TAVR on  04/16/2016.  He appears asymptomatic from  the metastatic neuroendocrine tumor unless the intermittent night sweats are related. We discussed treatment options. I recommend treatment with the LU-177 radio immunotherapy when this agent is FDA approved. We will consider treatment with sunitinib if the Lu-177 is not approved  when he has recovered from valve surgery.   He will continue monthly Sandostatin.  Betsy Coder, MD  03/01/2016  3:21 PM

## 2016-03-01 NOTE — Patient Instructions (Signed)

## 2016-03-01 NOTE — Telephone Encounter (Signed)
Gave pt  Cal & avs

## 2016-03-13 ENCOUNTER — Other Ambulatory Visit: Payer: Self-pay | Admitting: *Deleted

## 2016-03-13 DIAGNOSIS — I350 Nonrheumatic aortic (valve) stenosis: Secondary | ICD-10-CM

## 2016-03-21 ENCOUNTER — Encounter: Payer: Self-pay | Admitting: Cardiovascular Disease

## 2016-03-29 ENCOUNTER — Ambulatory Visit: Payer: BLUE CROSS/BLUE SHIELD

## 2016-04-01 ENCOUNTER — Encounter: Payer: Self-pay | Admitting: Thoracic Surgery (Cardiothoracic Vascular Surgery)

## 2016-04-01 ENCOUNTER — Institutional Professional Consult (permissible substitution) (INDEPENDENT_AMBULATORY_CARE_PROVIDER_SITE_OTHER): Payer: BLUE CROSS/BLUE SHIELD | Admitting: Thoracic Surgery (Cardiothoracic Vascular Surgery)

## 2016-04-01 VITALS — BP 129/80 | HR 81 | Resp 16 | Ht 70.0 in | Wt 184.0 lb

## 2016-04-01 DIAGNOSIS — I350 Nonrheumatic aortic (valve) stenosis: Secondary | ICD-10-CM | POA: Diagnosis not present

## 2016-04-01 DIAGNOSIS — D3A8 Other benign neuroendocrine tumors: Secondary | ICD-10-CM | POA: Diagnosis not present

## 2016-04-01 NOTE — Progress Notes (Signed)
[facility]  MULTIDISCIPLINARY HEART VALVE CLINIC  CARDIOTHORACIC SURGERY CONSULTATION REPORT  Referring Provider is [name], MD PCP is Cathlean Cower, MD  Chief Complaint  Patient presents with  . Aortic Stenosis    2ND TAVR eval...has seen Dr. 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   The patient originally presented in 1990 with stage IV testicular seminoma associated with metastatic involvement of the mediastinum and left chest. He underwent intensive chemotherapy and radiation therapy to the chest and mediastinum. 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 He also developed pulmonary fibrosis and pericarditis secondary to radiation therapy.  He later developed aortic stenosis which has slowly progressed over time for which the patient has been followed by Dr. Acie Fredrickson. In 2015 he was diagnosed with primary neuroendocrine tumor of the pancreas with metastatic involvement of the liver. He has been treated using Sandostatin and disease has been stable with very slow progression over the past 2 years. During this period of time the patient has developed progressive exertional shortness of breath and fatigue. Follow-up echocardiogram have revealed progression in severity of aortic stenosis. Most recent echocardiogram performed 12/06/2015 revealed severe aortic stenosis with peak velocity across the aortic valve measured between 3.7 and  4.1 m/s corresponding to mean transvalvular gradient estimated at least 39 mmHg, dimensionless velocity index only 0.27, and aortic valve area calculated 0.85 cm. There is moderate aortic insufficiency.  Left ventricular systolic function remains normal. The patient was referred Dr. Burt Knack and underwent diagnostic cardiac catheterization on 01/24/2016. The patient was found to have no significant coronary artery disease.  The patient was subsequently referred for surgical consultation and evaluated by Dr. Cyndia Bent on 02/08/2016. The patient was felt to be at least moderate risk for conventional surgical aortic valve replacement and CT angiography has been performed to evaluate the feasibility of transcatheter aortic valve replacement as an alternative. The patient has been referred for second surgical opinion.  The patient is married and lives locally in [place] with his wife. He has 3 grown children and numerous grandchildren. 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 The patient has been physically active all of his adult life and has enjoyed regular exercise up until recently. However, over the past few years the patient has experienced slow gradual progressive decline in his exercise tolerance secondary to exertional shortness of breath and fatigue. The patient now gets short of breath with moderate activity such as walking up a flight of stairs. He occasionally wakes up short of breath at night but typically he can lay flat in bed. He denies any history of resting shortness of breath, orthopnea, dizzy spells, or syncope. He has never had any chest pain or chest tightness either with activity or at rest. He has not had any pain in his abdomen or back which might be attributed to his known malignancy.  Appetite is stable and the patient has not been losing weight.    Past Medical History:  Diagnosis Date  .  Baker's cyst   . Heart murmur   .  Hypercholesteremia   . Hypertension   . Lesion of left lung    hx.25 yrs ago- testicilar cancer related- only scarring left after tx.-no problems now  . Pericarditis    2nd to tumor  . Primary neuroendocrine tumor of pancreas 02/14/2014   Stage IV with multiple mets to liver  . Pulmonary fibrosis ([place]) 11/08/2015  . Testicular seminoma ([place]) 1990   with metatstatic spread - good response to therapy-radiation and chemotherapy  . Transfusion history    hx. 25 yrs ago-during cancer tx.    Past Surgical History:  Procedure Laterality Date  . BACK SURGERY     '14- rupt. disc   . CARDIAC CATHETERIZATION N/A 01/24/2016   Procedure: Right/Left Heart Cath and Coronary Angiography;  Surgeon: [name], MD;  Location: [facility];  Service: Cardiovascular;  Laterality: N/A;  . EUS N/A 02/10/2014   Procedure: UPPER ENDOSCOPIC ULTRASOUND (EUS) LINEAR;  Surgeon: [name], MD;  Location: [facility];  Service: Endoscopy;  Laterality: N/A;  . KNEE SURGERY     age 83 for Baker's cyst  . THORACOTOMY  1990   wedge biopsy of mediastinal mass    Family History  Problem Relation Age of Onset  . Dementia Mother   . Cancer Maternal Aunt     colon  . Emphysema Maternal Aunt   . Cancer Paternal Grandfather     esophagus    Social History   Social History  . Marital status: Married    Spouse name: N/A  . Number of children: 3  . Years of education: 33   Occupational History  . [employer]   Social History Main Topics  . Smoking status: Never Smoker  . Smokeless tobacco: Never Used  . Alcohol use Yes     Comment: rare- social  . Drug use: No  . Sexual activity: Yes    Partners: Female   Other Topics Concern  . Not on file   Social History Narrative   [name]. 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 2 sons: 1979, 1985- married with 2 daughters 1 in route: Oldest in [place], younger [place], [place]. 1 daughter: 80 , married-1 granddaughter.Customer service manager- Nurse, mental health. SO-  good health; marriage in good health   Enjoys watching movies, works out at home gym    Current Outpatient Prescriptions  Medication Sig Dispense Refill  . aspirin EC 81 MG tablet Take 1 tablet (81 mg total) by mouth daily. 90 tablet 3  . calcium carbonate (TUMS - DOSED IN MG ELEMENTAL CALCIUM) 500 MG chewable tablet Chew 3 tablets by mouth as needed for indigestion or heartburn.    . docusate sodium (COLACE) 100 MG capsule Take 100 mg by mouth daily as needed for mild constipation.    Marland Kitchen ibuprofen (ADVIL,MOTRIN) 200 MG tablet Take 600 mg by mouth every 6 (six) hours as needed for mild pain or moderate pain.     Marland Kitchen losartan (COZAAR) 50 MG tablet Take 1 tablet (50 mg total) by mouth every morning. 90 tablet 3  . Octreotide Acetate (SANDOSTATIN IJ) Inject 1 each as directed every 30 (thirty) days.     . sildenafil (REVATIO) 20 MG tablet Take 20 mg by mouth as needed (for ED).    Marland Kitchen silodosin (RAPAFLO) 8 MG CAPS capsule Take 8 mg by mouth daily with breakfast. Reported on 02/08/2016    . simvastatin (ZOCOR) 20 MG tablet TAKE 1 TABLET (20 MG TOTAL)  BY MOUTH AT BEDTIME. 30 tablet 5   No current facility-administered medications for this visit.     Allergies  Allergen Reactions  . Penicillins Hives and Other (See Comments)    Entire body.      Review of Systems:   General:  normal appetite, decreased energy, no weight gain, no weight loss, no fever  Cardiac:  no chest pain with exertion, no chest pain at rest, +SOB with exertion, no resting SOB, + PND, no orthopnea, no palpitations, no arrhythmia, no atrial fibrillation, no LE edema, no dizzy spells, no syncope  Respiratory:  + shortness of breath, no home oxygen, no productive cough, no dry cough, no bronchitis, no wheezing, no hemoptysis, no asthma, no pain with inspiration or cough, no sleep apnea, no CPAP at night  GI:   no difficulty swallowing, no reflux, no frequent heartburn, no hiatal hernia, no abdominal pain, no constipation, no  diarrhea, no hematochezia, no hematemesis, no melena  GU:   no dysuria,  no frequency, no urinary tract infection, no hematuria, no enlarged prostate, no kidney stones, no kidney disease  Vascular:  no pain suggestive of claudication, no pain in feet, no leg cramps, no varicose veins, no DVT, no non-healing foot ulcer  Neuro:   no stroke, no TIA's, no seizures, no headaches, no temporary blindness one eye,  no slurred speech, no peripheral neuropathy, no chronic pain, no instability of gait, no memory/cognitive dysfunction  Musculoskeletal: no arthritis, no joint swelling, no myalgias, no difficulty walking, normal mobility   Skin:   no rash, no itching, no skin infections, no pressure sores or ulcerations  Psych:   no anxiety, no depression, no nervousness, no unusual recent stress  Eyes:   no blurry vision, no floaters, no recent vision changes, no wears glasses or contacts  ENT:   no hearing loss, no loose or painful teeth, no dentures, last saw dentist within the past year  Hematologic:  no easy bruising, no abnormal bleeding, no clotting disorder, no frequent epistaxis  Endocrine:  no diabetes, does not check CBG's at home           Physical Exam:   BP 129/80   Pulse 81   Resp 16   Ht 5\' 10"  (1.778 m)   Wt 184 lb (83.5 kg)   SpO2 98% Comment: ON RA  BMI 26.40 kg/m   General:    well-appearing  HEENT:  Unremarkable   Neck:   no JVD, no bruits, no adenopathy   Chest:   clear to auscultation, symmetrical breath sounds, no wheezes, no rhonchi   CV:   RRR, grade IV/VI crescendo/decrescendo murmur heard best at RUSB,  no diastolic murmur  Abdomen:  soft, non-tender, no masses   Extremities:  warm, well-perfused, pulses palpable, no LE edema  Rectal/GU  Deferred  Neuro:   Grossly non-focal and symmetrical throughout  Skin:   Clean and dry, no rashes, no breakdown   Diagnostic Tests:  Transthoracic Echocardiography  Patient:    Ronald Thompson, Ronald Thompson MR #:       [id number] Study Date:  12/06/2015 Gender:     M Age:        4 Height:     177.8 cm Weight:     83.8 kg BSA:        2.05 m^2 Pt. Status: Room:   ATTENDING    [facility]  REFERRING    [name]  SONOGRAPHER  Cindy Hazy, RDCS  PERFORMING   [facility], Outpatient  cc:  ------------------------------------------------------------------- LV EF: 60% -   65%  ------------------------------------------------------------------- Indications:      I35.0 Aortic Stenosis.  ------------------------------------------------------------------- History:   PMH:  Acquired from the patient and from the patient&'s chart.  PMH:  Murmur. Pericarditis.  Risk factors:  Hypertension. Hypercholesterolemia.  ------------------------------------------------------------------- Study Conclusions  - Left ventricle: The cavity size was normal. Wall thickness was   increased in a pattern of mild LVH. Systolic function was normal.   The estimated ejection fraction was in the range of 60% to 65%.   Doppler parameters are consistent with abnormal left ventricular   relaxation (grade 1 diastolic dysfunction). The E/e&' ratio is   >15, suggesting elevated LV filling pressure. - Aortic valve: Heavily calcified aortic valve with severe aortic   stenosis. There was mild regurgitation. Mean gradient (S): 39 mm   Hg. Peak gradient (S): 61 mm Hg. Valve area (VTI): 0.98 cm^2.   Valve area (Vmax): 0.85 cm^2. - Mitral valve: Calcified annulus. There was trivial regurgitation. - Left atrium: The atrium was mildly dilated. - Right ventricle: The cavity size was mildly dilated. - Right atrium: The atrium was mildly dilated. - Tricuspid valve: There was trivial regurgitation. - Pulmonary arteries: PA peak pressure: 41 mm Hg (S). - Inferior vena cava: The vessel was dilated. The respirophasic   diameter changes were blunted (< 50%), consistent with elevated   central venous  pressure.  Impressions:  - Compared to a prior echo in 2011, there is now severe aortic   stenosis - the mean gradient is 39 mmHg - calculated AVA is   0.8-0.9 cm2.  Transthoracic echocardiography.  M-mode, complete 2D, spectral Doppler, and color Doppler.  Birthdate:  Patient birthdate: 1956/01/12.  Age:  Patient is 60 yr old.  Sex:  Gender: male. BMI: 26.5 kg/m^2.  Blood pressure:     126/80  Patient status: Outpatient.  Study date:  Study date: 12/06/2015. Study time: 10:43 AM.  Location:  [facility]  -------------------------------------------------------------------  ------------------------------------------------------------------- Left ventricle:  The cavity size was normal. Wall thickness was increased in a pattern of mild LVH. Systolic function was normal. The estimated ejection fraction was in the range of 60% to 65%. Doppler parameters are consistent with abnormal left ventricular relaxation (grade 1 diastolic dysfunction). The E/e&' ratio is >15, suggesting elevated LV filling pressure.  ------------------------------------------------------------------- Aortic valve:  Heavily calcified aortic valve with severe aortic stenosis.  Doppler:  There was mild regurgitation.    VTI ratio of LVOT to aortic valve: 0.31. Valve area (VTI): 0.98 cm^2. Indexed valve area (VTI): 0.48 cm^2/m^2. Peak velocity ratio of LVOT to aortic valve: 0.27. Valve area (Vmax): 0.85 cm^2. Indexed valve area (Vmax): 0.42 cm^2/m^2. Mean velocity ratio of LVOT to aortic valve: 0.29. Valve area (Vmean): 0.91 cm^2. Indexed valve area (Vmean): 0.44 cm^2/m^2.    Mean gradient (S): 39 mm Hg. Peak gradient (S): 61 mm Hg.  ------------------------------------------------------------------- Aorta:  Aortic root: The aortic root was normal in size. Ascending aorta: The ascending aorta was normal in size.  ------------------------------------------------------------------- Mitral valve:    Calcified annulus.  Doppler:  There was trivial regurgitation.    Peak gradient (D): 9 mm Hg.  ------------------------------------------------------------------- Left atrium:  The atrium was mildly dilated.  ------------------------------------------------------------------- Atrial septum:  No defect or patent foramen ovale was identified.   ------------------------------------------------------------------- Right ventricle:  The cavity size was mildly dilated. Systolic function was normal.  ------------------------------------------------------------------- Pulmonic valve:  The valve appears to be grossly normal. Doppler:  There was no significant regurgitation.  ------------------------------------------------------------------- Tricuspid valve:   Doppler:  There was trivial regurgitation.  ------------------------------------------------------------------- Pulmonary artery:   Poorly visualized.  ------------------------------------------------------------------- Right atrium:  The atrium was mildly dilated.  ------------------------------------------------------------------- Pericardium:  There was no pericardial effusion.  ------------------------------------------------------------------- Systemic veins: Inferior vena cava: The vessel was dilated. The respirophasic diameter changes were blunted (< 50%), consistent with elevated central venous pressure.  ------------------------------------------------------------------- Measurements   Left ventricle                            Value          Reference  LV ID, ED, PLAX chordal           (L)     35.7  mm       43 - 52  LV ID, ES, PLAX chordal                   24.6  mm       23 - 38  LV fx shortening, PLAX chordal            31    %        >=29  LV PW thickness, ED                       12.4  mm       ---------  IVS/LV PW ratio, ED                       1.27           <=1.3  Stroke volume, 2D                          81    ml       ---------  Stroke volume/bsa, 2D                     40    ml/m^2   ---------  LV e&', lateral                            6.95  cm/s     ---------  LV E/e&', lateral                          21.15          ---------  LV e&', medial                             5.07  cm/s     ---------  LV E/e&', medial                           28.99          ---------  LV e&', average                            6.01  cm/s     ---------  LV E/e&', average                          24.46          ---------  Ventricular septum                        Value          Reference  IVS thickness, ED                         15.7  mm       ---------    LVOT                                      Value          Reference  LVOT ID, S                                20    mm       ---------  LVOT area                                 3.14  cm^2     ---------  LVOT ID                                   20    mm       ---------  LVOT peak velocity, S                     105   cm/s     ---------  LVOT mean velocity, S                     85.7  cm/s     ---------  LVOT VTI, S                               25.9  cm       ---------  Stroke volume (SV), LVOT DP               81.4  ml       ---------  Stroke index (SV/bsa), LVOT DP            39.7  ml/m^2   ---------    Aortic valve                              Value          Reference  Aortic valve peak velocity, S             390   cm/s     ---------  Aortic valve mean velocity, S             297   cm/s     ---------  Aortic valve VTI, S                       82.8  cm       ---------  Aortic mean gradient, S                   39    mm Hg    ---------  Aortic  peak gradient, S                   61    mm Hg    ---------  VTI ratio, LVOT/AV                        0.31           ---------  Aortic valve area, VTI                    0.98  cm^2     ---------  Aortic valve area/bsa, VTI                0.48  cm^2/m^2 ---------  Velocity ratio, peak, LVOT/AV              0.27           ---------  Aortic valve area, peak velocity          0.85  cm^2     ---------  Aortic valve area/bsa, peak               0.42  cm^2/m^2 ---------  velocity  Velocity ratio, mean, LVOT/AV             0.29           ---------  Aortic valve area, mean velocity          0.91  cm^2     ---------  Aortic valve area/bsa, mean               0.44  cm^2/m^2 ---------  velocity  Aortic regurg pressure half-time          308   ms       ---------    Aorta                                     Value          Reference  Aortic root ID, ED                        34    mm       ---------    Left atrium                               Value          Reference  LA ID, A-P, ES                            39    mm       ---------  LA ID/bsa, A-P                            1.9   cm/m^2   <=2.2  LA volume, S                              72.8  ml       ---------  LA volume/bsa, S                          35.6  ml/m^2   ---------  LA volume, ES, 1-p A4C                    61    ml       ---------  LA volume/bsa, ES, 1-p A4C                29.8  ml/m^2   ---------  LA volume, ES, 1-p A2C                    85.2  ml       ---------  LA volume/bsa, ES, 1-p A2C                41.6  ml/m^2   ---------    Mitral valve                              Value          Reference  Mitral E-wave peak velocity               147   cm/s     ---------  Mitral A-wave peak velocity               157   cm/s     ---------  Mitral deceleration time          (H)     299   ms       150 - 230  Mitral peak gradient, D                   9     mm Hg    ---------  Mitral E/A ratio, peak                    0.9            ---------    Pulmonary arteries                        Value          Reference  PA pressure, S, DP                (H)     41    mm Hg    <=30    Tricuspid valve                           Value          Reference  Tricuspid regurg peak velocity            256   cm/s     ---------  Tricuspid peak RV-RA  gradient             26    mm Hg    ---------    Right ventricle                           Value          Reference  TAPSE                                     27    mm       ---------  RV s&', lateral, S  22.6  cm/s     ---------  Legend: (L)  and  (H)  mark values outside specified reference range.  ------------------------------------------------------------------- Prepared and Electronically Authenticated by  Lyman Bishop MD 2017-05-03T15:58:12   Right/Left Heart Cath and Coronary Angiography  Conclusion   1. Known severe aortic stenosis with heavy calcification of the aortic valve, and 3+ aortic insufficiency by aortography 2. Widely patent coronary arteries (left-dominant) 3. Elevated right-sided intracardiac pressures  Recommendations: cardiac surgical consultation for continued evaluation of treatment options for severe symptomatic aortic stenosis  Indications   Severe aortic stenosis [I35.0 ([id number]  Complications   Complications documented in old activity   INDICATION: Severe aortic stenosis   PROCEDURAL DETAILS:  Initially, right heart catheterization is attempted through the left antecubital vein. There was an indwelling IV and this was changed out for a 5/6 French slender sheath without difficulty. I was not able to access the central venous circulation. A venogram is performed and the left subclavian vein is occluded with collaterals. Attention is then turned to the right groin.  The right groin was prepped, draped, and anesthetized with 1% lidocaine. Using the modified Seldinger technique a 5 French sheath was placed in the right femoral artery and a 7 French sheath was placed in the right femoral vein. A Swan-Ganz catheter was used for the right heart catheterization. Standard protocol was followed for recording of right heart pressures and sampling of oxygen saturations. Fick cardiac output was calculated. Standard Judkins catheters  were used for selective coronary angiography and aortic root angiography. There were no immediate procedural complications. The patient was transferred to the post catheterization recovery area for further monitoring.   During this procedure the patient is administered a total of Versed 2 mg and Fentanyl 25 mg to achieve and maintain moderate conscious sedation. The patient's heart rate, blood pressure, and oxygen saturation are monitored continuously during the procedure. The period of conscious sedation is 54 minutes, of which I was present face-to-face 100% of this time.  Estimated blood loss <50 mL. There were no immediate complications during the procedure.      Coronary Findings   Dominance: Left  Left Anterior Descending  The LAD has no significant obstructive disease. The vessel has mild calcification. It is patent to the LV apex.  Ramus Intermedius  Medium caliber vessel without significant stenosis.  Left Circumflex  The left circumflex is a large, dominant vessel. The vessel is patent throughout without stenosis. The obtuse marginal and left PDA branches are patent.  Right Coronary Artery  Medium caliber, nondominant right coronary artery without stenosis.  Right Heart   Right Heart Pressures Hemodynamic findings consistent with mild pulmonary hypertension. LV EDP is normal. Mild pulmonary HTN with transpulmonic gradient 7 mmHg, PVR 2 Woods Units, suggestive of left heart disease    Left Heart   Aortic Valve There is severe aortic valve stenosis, and moderate (3+) aortic regurgitation. The aortic valve is calcified. There is restricted aortic valve motion. The aortic valve is severely calcified. There is bulky calcifications in the aortic valve leaflets and annulus extending into the inter-valvular fibrosa    Coronary Diagrams   Diagnostic Diagram     Implants     No implant documentation for this case.  PACS Images   Show images for Cardiac catheterization   Link to  Procedure Log   Procedure Log    Hemo Data   Flowsheet Row Most Recent Value  Fick Cardiac Output 3.44 L/min  Fick Cardiac Output Index 1.73 (  L/min)/BSA  RA A Wave 15 mmHg  RA V Wave 12 mmHg  RA Mean 12 mmHg  RV Systolic Pressure 58 mmHg  RV Diastolic Pressure 7 mmHg  RV EDP 14 mmHg  PA Systolic Pressure 46 mmHg  PA Diastolic Pressure 15 mmHg  PA Mean 28 mmHg  PW A Wave 22 mmHg  PW V Wave 24 mmHg  PW Mean 21 mmHg  AO Systolic Pressure [id number] mmHg  AO Diastolic Pressure 75 mmHg  AO Mean 99 mmHg  QP/QS 1  TPVR Index 16.19 HRUI  TSVR Index 57.23 HRUI  PVR SVR Ratio 0.08  TPVR/TSVR Ratio 0.28    Cardiac TAVR CT TECHNIQUE: The patient was scanned on a Philips 256 scanner. A 120 kV retrospective scan was triggered in the descending thoracic aorta at 111 HU's. Gantry rotation speed was 270 msecs and collimation was .9 mm. No beta blockade or nitro were given. The 3D data set was reconstructed in 5% intervals of the R-R cycle. Systolic and diastolic phases were analyzed on a dedicated work station using MPR, MIP and VRT modes. The patient received 80 cc of contrast. FINDINGS: Aortic Valve: Trileaflet with severe extensive calcifications of all of the leaflets with severe subvalvular calcifications extending into the LVOT predominantly under the left coronary leaflet and continuing into the mitral annular calcifications. Aorta: Normal caliber with mild mild diffuse calcifications in the aortic arch and no dissection. Sinotubular Junction:  28 x 27 mm Ascending Thoracic Aorta:  29 x 28 mm Aortic Arch:  26 x 24 mm Descending Thoracic Aorta:  22 x 22 mm Sinus of Valsalva Measurements: Non-coronary:  35 mm Right -coronary:  33 mm Left -coronary:  32 mm Coronary Artery Height above Annulus: Left Main:  16 mm Right Coronary:  18 mm Virtual Basal Annulus Measurements: Maximum/Minimum Diameter:  28 x 24 mm Perimeter:  82 mm Area:  502 mm2 Coronary Arteries: Calcium score of  9 that represents 32 percentile for age/sex. Coronary evaluation is suboptimal however: Normal coronary origin with left dominance. Left main is a large vessel with no stenosis. LAD is a a large and long vessel that wraps around the apex and gives rise to 2 diagonal branches. There is mild calcified plaque in the proximal LAD with associated stenosis 0-25%. LCX is a large dominant vessel that gives rise to two obtuse marginal branches and PDA. There is no significant stenosis. RCA is a small non-dominant artery that is poorly visualized. Optimum Fluoroscopic Angle for Delivery:  LAO 5 CAU 5 Other findings: Dilated pulmonary artery measuring 35 x 27 mm suggestive of pulmonary hypertension. Significantly dilated right atrium and ventricle. Norma pulmonary vein drainage into the left atrium. No thrombus in the left atrial appendage. No ASD/VSD. IMPRESSION: 1. Aortic valve is trileaflet with extensive calcifications of all three leaflets with asymmetric subvalvular calcifications predominantly under the left coronary cusp extending into LVOT and MAC. Because of severe calcifications the annular sizing is challenging, however it appears the the annulus is suitable for delivery of a 26 mm Kenton-SAPIEN TAVR valve. 2.  There is sufficient annulus to coronary distance. 3. There is normal coronary origin with left dominance and only mild non-obstructive CAD in the LAD. 4. Optimum Fluoroscopic Angle for Delivery: LAO 5 CAU 5 5. Dilated right heart chambers and pulmonary artery are consistent with pulmonary hypertension. Interventricular septal flattening during diastole is consistent with RV fluid overload. Ena Dawley Electronically Signed   By: Ena Dawley   On: 01/18/2016 18:36  CT ANGIOGRAPHY CHEST, ABDOMEN AND PELVIS  TECHNIQUE: Multidetector CT imaging through the chest, abdomen and pelvis was performed using the standard protocol during bolus administration  of intravenous contrast. Multiplanar reconstructed images and MIPs were obtained and reviewed to evaluate the vascular anatomy.  CONTRAST:  70 mL of Isovue 370.  COMPARISON:  CT the abdomen and pelvis 08/30/2015. Chest CT 02/04/2014.  FINDINGS: CTA CHEST FINDINGS  Mediastinum/Lymph Nodes: Heart size is enlarged with right atrial and right ventricular dilatation. There is some bowing of the interventricular septum from right to left, which could suggest elevated right-sided heart pressures. Pericardial thickening calcification is noted, most evident overlying the right ventricular outflow tract, pulmonic valve and proximal pulmonic trunk. No other pericardial calcifications or additional areas of pericardial thickening are noted. There is atherosclerosis of the thoracic aorta, the great vessels of the mediastinum and the coronary arteries, including calcified atherosclerotic plaque in the left anterior descending coronary artery. Severe calcifications of the aortic valve. In addition, there are severe calcifications of the mitral-aortic intervalvular fibrosa, mitral annulus and anterior leaflet of the mitral valve. No pathologically enlarged mediastinal or hilar lymph nodes. Left axillary vein appears diminutive. No left subclavian or left innominate vein of significant caliber is identified on today's examination. Innumerable small collateral veins are noted in the left axillary region, lower cervical regions bilaterally, and involving the azygos/hemiazygous system. Esophagus is unremarkable in appearance. No axillary lymphadenopathy.  Lungs/Pleura: 6 mm subpleural nodule in the periphery of the lateral segment of the right middle lobe (image 47 of series 507) is similar to prior examination 08/30/2015, likely a subpleural lymph node. 2 mm left lower lobe pulmonary nodule (image 34 of series 507), unchanged dating back to 02/04/2014, considered benign. No other larger more  suspicious appearing pulmonary nodules or masses are noted. Extensive septal thickening and architectural distortion in the left upper lobe, similar to prior study 02/04/2014, presumably either post infectious/inflammatory scarring, or related to remote radiation therapy. No acute consolidative airspace disease. No pleural effusions. Chronic elevation of left hemidiaphragm is unchanged.  Musculoskeletal/Soft Tissues: There are no aggressive appearing lytic or blastic lesions noted in the visualized portions of the skeleton.  CTA ABDOMEN AND PELVIS FINDINGS  Hepatobiliary: Again noted are numerous heterogeneously enhancing lesions scattered throughout the liver, compatible with widespread metastatic disease. The largest of these is in the periphery of segment 7 of the liver (image 289 of series 502) which currently measures 5.2 x 4.1 cm (previously 4.3 x 3.4 cm on prior 08/30/2015. Several other lesions have clearly progressed compared the prior study, including a very ill-defined lesion between segments 5 and 6 on image 367 of series 502 which currently measures 4.8 x 3.4 cm (previously 4.1 x 2.7 cm on 09/01/2015). Additional example of progression is a 3.4 x 2.4 cm lesion in segment 4B adjacent to the falciform ligament (image 350 of series 502) which previously measured only 2.9 x 2.1 cm. No intra or extrahepatic biliary ductal dilatation. Partially calcified gallstone in the fundus of the gallbladder measuring 3.0 cm in diameter. Gallbladder is partially contracted. No pericholecystic fluid or surrounding inflammatory changes.  Pancreas: The primary lesion in the head of the pancreas appears larger than the prior study, currently measuring 2.4 x 1.7 cm (image 192 of series 501), previously 1.4 cm on 08/30/2015. No pancreatic ductal dilatation. No pancreatic or peripancreatic fluid or inflammatory changes.  Spleen: Unremarkable.  Adrenals/Urinary Tract: Several sub cm  low-attenuation lesions in the kidneys bilaterally too small to definitively characterize, but  are statistically likely to represent cysts. 3.9 cm simple cyst in the medial aspect of the interpolar region of the right kidney. No hydroureteronephrosis. Bilateral adrenal glands are normal in appearance. Urinary bladder is normal in appearance.  Stomach/Bowel: The appearance of the stomach is normal. There is no pathologic dilatation of small bowel or colon. The appendix is not confidently identified and may be surgically absent. Regardless, there are no inflammatory changes noted adjacent to the cecum to suggest the presence of an acute appendicitis at this time.  Vascular/Lymphatic: Atherosclerosis throughout the abdominal and pelvic vasculature, without evidence of aneurysm or dissection. Vascular findings and measurements pertinent to potential TAVR procedure, as detailed below. Dilated intrahepatic portion of the IVC which measures up to 3.4 cm in diameter. No lymphadenopathy noted in the abdomen or pelvis, although portacaval lymph nodes are borderline enlarged measuring up to 11 mm in short axis.  Reproductive: Prostate gland and seminal vesicles are unremarkable in appearance. Bilateral hydroceles are incompletely visualized in the upper scrotum.  Other: No significant volume of ascites.  No pneumoperitoneum.  Musculoskeletal: 13 mm sclerotic lesion in the left ilium just lateral to the left sacroiliac joint, similar to prior studies dating back to at least 02/04/2014, presumably benign. There are no aggressive appearing lytic or blastic lesions noted in the visualized portions of the skeleton.  VASCULAR MEASUREMENTS PERTINENT TO TAVR:  AORTA:  Minimal Aortic Diameter -  15 x 18 mm  Severity of Aortic Calcification -  mild  RIGHT PELVIS:  Right Common Iliac Artery -  Minimal Diameter - 9.0 x 10.5 mm  Tortuosity - moderate  Calcification -  mild  Right External Iliac Artery -  Minimal Diameter - 8.3 x 10.3 mm  Tortuosity - severe  Calcification - mild  Right Common Femoral Artery -  Minimal Diameter - 9.9 x 9.6 mm  Tortuosity - mild  Calcification - none  LEFT PELVIS:  Left Common Iliac Artery -  Minimal Diameter - 11.2 x 9.8 mm  Tortuosity - moderate  Calcification - mild  Left External Iliac Artery -  Minimal Diameter - 9.9 x 8.1 mm  Tortuosity - severe  Calcification - mild  Left Common Femoral Artery -  Minimal Diameter - 9.7 x 7.8 mm  Tortuosity - mild  Calcification - mild  Review of the MIP images confirms the above findings.  IMPRESSION: 1. Vascular findings and measurements pertinent to potential TAVR procedure, as detailed above. This patient does appear to have spot suitable pelvic arterial access bilaterally, however, the external iliac arteries are extremely tortuous bilaterally. The degree of tortuosity appears less severe on the left side. Unfortunately, there is only minimal calcified atherosclerotic plaque throughout the pelvic arterial access vessels. 2. Severe thickening and calcification of the aortic valve, compatible with the reported clinical history of severe aortic stenosis. There is also very severe calcification of the mitral-aortic intervalvular fibrosa, anterior leaflet of the mitral valve and superior aspect of the mitral annulus, which have implications for potential TAVR procedure. 3. Widespread metastatic disease to the liver again noted, as above. This has clearly progressed compared to the prior study from 08/30/2015. 4. 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 5. Right atrial and right ventricular dilatation, reflux of contrast into the IVC (which appears dilated), and bowing of the interventricular septum from  right to left, concerning for elevated right-sided heart pressures. 6. Pericardial calcification most  evident anteriorly overlying the right ventricular outflow tract, pulmonic valve and proximal pulmonic trunk, potentially related to prior mediastinal radiation therapy. 7. Fibrotic changes in the left upper lobe, similar to prior study, also potentially related to prior radiation therapy, or potentially post infectious scarring. 8. Additional incidental findings, as above.   Electronically Signed   By: Vinnie Langton M.D.   On: 01/17/2016 12:56       Impression:  Patient has stage D severe symptomatic aortic stenosis. He presents with gradual progression of symptoms of exertional shortness of breath and fatigue consistent with chronic diastolic congestive heart failure, New York Heart Association functional class II. I have personally reviewed the patient's recent transthoracic echocardiogram, cardiac catheterization, and CT angiograms. Echocardiogram reveals extremely severe calcification and thickening of all of the leaflets of the aortic valve. The valve may be functioning bicuspid but it is difficult to tell because of the severity of calcification. Peak velocity across the aortic valve ranges between 3.7 and 4.1 m/s on this most recent echocardiogram, corresponding to mean transvalvular gradient estimated greater than 40 mmHg. Left ventricular systolic function remains preserved. There is moderate aortic insufficiency. Diagnostic cardiac catheterization is notable for the absence of significant coronary artery disease. Risks associated with conventional surgical aortic valve replacement would be at least moderately elevated due to the extensive calcification in the aortic root and changes related to severe radiation therapy in the past, including known pulmonary fibrosis. Cardiac gated CT angiogram of the heart reveals findings consistent with severe aortic stenosis and anatomical  characteristics suitable for transcatheter aortic valve replacement. There is extensive calcification in the aortic root which might place the patient at slightly increased risk for the development of perivalvular leak following TAVR.  CT image gram of the aorta and iliac vessels demonstrate adequate vascular access to facilitate a percutaneous transfemoral approach.   Plan:  The patient was counseled at length regarding treatment alternatives for management of severe symptomatic aortic stenosis. Alternative approaches such as conventional aortic valve replacement, transcatheter aortic valve replacement, and palliative medical therapy were compared and contrasted at length.  The natural history of severe aortic stenosis was discussed at length and put into contact with regards to the patient's known malignancy. The risks associated with conventional surgical aortic valve replacement were been discussed in detail, as were expectations for post-operative convalescence. Long-term prognosis with medical therapy was discussed. This discussion was placed in the context of the patient's own specific clinical presentation and past medical history.  All of his questions been addressed.  The patient is interested in proceeding with transcatheter aortic valve replacement as an alternative to conventional surgery associated with at least moderate risk.  We plan to proceed with surgery on Tuesday, 04/16/2016.  Following the decision to proceed with transcatheter aortic valve replacement, a discussion has been held regarding what types of management strategies would be attempted intraoperatively in the event of life-threatening complications, including whether or not the patient would be considered a candidate for the use of cardiopulmonary bypass and/or conversion to open sternotomy for attempted surgical intervention.  The patient has been advised of a variety of complications that might develop including but not limited  to risks of death, stroke, paravalvular leak, aortic dissection or other major vascular complications, aortic annulus rupture, device embolization, cardiac rupture or perforation, mitral regurgitation, acute myocardial infarction, arrhythmia, heart block or bradycardia requiring permanent pacemaker placement, congestive heart failure, respiratory failure, renal failure, pneumonia, infection, other late complications related to structural valve deterioration or migration,  or other complications that might ultimately cause a temporary or permanent loss of functional independence or other long term morbidity.  The patient provides full informed consent for the procedure as described and all questions were answered.      Valentina Gu. Roxy Manns, MD 04/01/2016 10:17 AM

## 2016-04-01 NOTE — Patient Instructions (Signed)
   Patient should continue taking all current medications without change through the day before surgery.  Patient should have nothing to eat or drink after midnight the night before surgery.  On the morning of surgery patient does not need to take any of his regular medications

## 2016-04-02 ENCOUNTER — Ambulatory Visit (HOSPITAL_BASED_OUTPATIENT_CLINIC_OR_DEPARTMENT_OTHER): Payer: BLUE CROSS/BLUE SHIELD

## 2016-04-02 VITALS — BP 132/61 | HR 72 | Temp 98.0°F | Resp 18

## 2016-04-02 DIAGNOSIS — C7A8 Other malignant neuroendocrine tumors: Secondary | ICD-10-CM | POA: Diagnosis not present

## 2016-04-02 DIAGNOSIS — D3A8 Other benign neuroendocrine tumors: Secondary | ICD-10-CM

## 2016-04-02 DIAGNOSIS — C7B8 Other secondary neuroendocrine tumors: Secondary | ICD-10-CM | POA: Diagnosis not present

## 2016-04-02 MED ORDER — OCTREOTIDE ACETATE 30 MG IM KIT
30.0000 mg | PACK | Freq: Once | INTRAMUSCULAR | Status: AC
  Administered 2016-04-02: 30 mg via INTRAMUSCULAR
  Filled 2016-04-02: qty 1

## 2016-04-12 ENCOUNTER — Encounter (HOSPITAL_COMMUNITY)
Admission: RE | Admit: 2016-04-12 | Discharge: 2016-04-12 | Disposition: A | Discharge status: Home / Self Care | Payer: BLUE CROSS/BLUE SHIELD | Source: Ambulatory Visit | Attending: Cardiovascular Disease | Admitting: Cardiovascular Disease

## 2016-04-12 ENCOUNTER — Encounter (HOSPITAL_COMMUNITY): Payer: Self-pay

## 2016-04-12 ENCOUNTER — Ambulatory Visit (HOSPITAL_COMMUNITY)
Admission: RE | Admit: 2016-04-12 | Discharge: 2016-04-12 | Disposition: A | Discharge status: Home / Self Care | Payer: BLUE CROSS/BLUE SHIELD | Source: Ambulatory Visit | Attending: Cardiovascular Disease | Admitting: Cardiovascular Disease

## 2016-04-12 ENCOUNTER — Other Ambulatory Visit: Payer: Self-pay

## 2016-04-12 DIAGNOSIS — I350 Nonrheumatic aortic (valve) stenosis: Secondary | ICD-10-CM | POA: Insufficient documentation

## 2016-04-12 DIAGNOSIS — Z01818 Encounter for other preprocedural examination: Secondary | ICD-10-CM | POA: Diagnosis not present

## 2016-04-12 DIAGNOSIS — I700 Atherosclerosis of aorta: Secondary | ICD-10-CM | POA: Insufficient documentation

## 2016-04-12 DIAGNOSIS — I517 Cardiomegaly: Secondary | ICD-10-CM | POA: Diagnosis not present

## 2016-04-12 HISTORY — DX: Acute embolism and thrombosis of left subclavian vein: I82.B12

## 2016-04-12 HISTORY — DX: Calculus of gallbladder without cholecystitis without obstruction: K80.20

## 2016-04-12 HISTORY — DX: Pneumonia, unspecified organism: J18.9

## 2016-04-12 HISTORY — DX: Reserved for inherently not codable concepts without codable children: IMO0001

## 2016-04-12 HISTORY — DX: Secondary malignant neoplasm of liver and intrahepatic bile duct: C78.7

## 2016-04-12 LAB — TYPE AND SCREEN
ABO/RH(D): A POS
Antibody Screen: NEGATIVE

## 2016-04-12 LAB — COMPREHENSIVE METABOLIC PANEL
ALT: 22 U/L (ref 17–63)
ANION GAP: 9 (ref 5–15)
AST: 31 U/L (ref 15–41)
Albumin: 4.1 g/dL (ref 3.5–5.0)
Alkaline Phosphatase: 56 U/L (ref 38–126)
BILIRUBIN TOTAL: 1.2 mg/dL (ref 0.3–1.2)
BUN: 20 mg/dL (ref 6–20)
CHLORIDE: 103 mmol/L (ref 101–111)
CO2: 26 mmol/L (ref 22–32)
Calcium: 9.2 mg/dL (ref 8.9–10.3)
Creatinine, Ser: 0.99 mg/dL (ref 0.61–1.24)
Glucose, Bld: 129 mg/dL — ABNORMAL HIGH (ref 65–99)
POTASSIUM: 3.9 mmol/L (ref 3.5–5.1)
Sodium: 138 mmol/L (ref 135–145)
TOTAL PROTEIN: 6.1 g/dL — AB (ref 6.5–8.1)

## 2016-04-12 LAB — URINALYSIS, ROUTINE W REFLEX MICROSCOPIC
BILIRUBIN URINE: NEGATIVE
Glucose, UA: NEGATIVE mg/dL
Hgb urine dipstick: NEGATIVE
KETONES UR: NEGATIVE mg/dL
LEUKOCYTES UA: NEGATIVE
NITRITE: NEGATIVE
PROTEIN: NEGATIVE mg/dL
Specific Gravity, Urine: 1.025 (ref 1.005–1.030)
pH: 5.5 (ref 5.0–8.0)

## 2016-04-12 LAB — CBC
HCT: 42.1 % (ref 39.0–52.0)
Hemoglobin: 14.5 g/dL (ref 13.0–17.0)
MCH: 31.9 pg (ref 26.0–34.0)
MCHC: 34.4 g/dL (ref 30.0–36.0)
MCV: 92.5 fL (ref 78.0–100.0)
Platelets: 150 10*3/uL (ref 150–400)
RBC: 4.55 MIL/uL (ref 4.22–5.81)
RDW: 12.4 % (ref 11.5–15.5)
WBC: 6.3 10*3/uL (ref 4.0–10.5)

## 2016-04-12 LAB — ABO/RH: ABO/RH(D): A POS

## 2016-04-12 LAB — SURGICAL PCR SCREEN
MRSA, PCR: NEGATIVE
STAPHYLOCOCCUS AUREUS: POSITIVE — AB

## 2016-04-12 LAB — BLOOD GAS, ARTERIAL
ACID-BASE EXCESS: 3.8 mmol/L — AB (ref 0.0–2.0)
BICARBONATE: 27.9 mmol/L (ref 20.0–28.0)
DRAWN BY: 42180
FIO2: 21
O2 SAT: 92.4 %
PCO2 ART: 42.5 mmHg (ref 32.0–48.0)
Patient temperature: 98.6
pH, Arterial: 7.433 (ref 7.350–7.450)
pO2, Arterial: 67.9 mmHg — ABNORMAL LOW (ref 83.0–108.0)

## 2016-04-12 LAB — APTT: APTT: 32 s (ref 24–36)

## 2016-04-12 LAB — PROTIME-INR
INR: 1.11
PROTHROMBIN TIME: 14.4 s (ref 11.4–15.2)

## 2016-04-12 NOTE — Progress Notes (Signed)
I called a prescription for Mupirocin ointment to [facility]

## 2016-04-12 NOTE — Pre-Procedure Instructions (Signed)
    Abhiram Goza  04/12/2016     Your procedure is scheduled on Tuesday, September 12.  Report to [facility] Admitting at 5:30AM .                Your surgery or procedure is scheduled for 7:30AM   Call this number if you have problems the morning of [phone]                For any other questions, please call [phone], Monday - Friday 8 AM - 4 PM.   Remember:  Do not eat food or drink liquids after midnight Monday, September 11.              Take these medicines the morning of surgery with A SIP OF WATER :None               STOP Aspirin as directed by Dr Burt Knack.   Do not wear jewelry, make-up or nail polish.  Do not wear lotions, powders, or perfumes, or deodrant.              Men may shave face and neck.  Do not bring valuables to the hospital.  [facility] is not responsible for any belongings or valuables.  Contacts, dentures or bridgework may not be worn into surgery.  Leave your suitcase in the car.  After surgery it may be brought to your room.  For patients admitted to the hospital, discharge time will be determined by your treatment team  Special instructions:Review  [facility] - Preparing For Surgery.  Please read over the following fact sheets that you were given. [facility]- Preparing For Surgery and Patient Instructions for Mupirocin Application, Incentive Spirometry.

## 2016-04-13 LAB — HEMOGLOBIN A1C
Hgb A1c MFr Bld: 5.9 % — ABNORMAL HIGH (ref 4.8–5.6)
MEAN PLASMA GLUCOSE: 123 mg/dL

## 2016-04-15 MED ORDER — DEXMEDETOMIDINE HCL IN NACL 400 MCG/100ML IV SOLN
0.1000 ug/kg/h | INTRAVENOUS | Status: AC
  Administered 2016-04-16: .5 ug/kg/h via INTRAVENOUS
  Filled 2016-04-15: qty 100

## 2016-04-15 MED ORDER — MAGNESIUM SULFATE 50 % IJ SOLN
40.0000 meq | INTRAMUSCULAR | Status: DC
  Filled 2016-04-15: qty 10

## 2016-04-15 MED ORDER — DOPAMINE-DEXTROSE 3.2-5 MG/ML-% IV SOLN
0.0000 ug/kg/min | INTRAVENOUS | Status: DC
  Filled 2016-04-15 (×3): qty 250

## 2016-04-15 MED ORDER — LEVOFLOXACIN IN D5W 500 MG/100ML IV SOLN
500.0000 mg | INTRAVENOUS | Status: AC
  Administered 2016-04-16: 500 mg via INTRAVENOUS
  Filled 2016-04-15: qty 100

## 2016-04-15 MED ORDER — POTASSIUM CHLORIDE 2 MEQ/ML IV SOLN
80.0000 meq | INTRAVENOUS | Status: DC
  Filled 2016-04-15: qty 40

## 2016-04-15 MED ORDER — SODIUM CHLORIDE 0.9 % IV SOLN
INTRAVENOUS | Status: DC
  Filled 2016-04-15: qty 30

## 2016-04-15 MED ORDER — NITROGLYCERIN IN D5W 200-5 MCG/ML-% IV SOLN
2.0000 ug/min | INTRAVENOUS | Status: DC
Start: 2016-04-16 — End: 2016-04-16
  Filled 2016-04-15: qty 250

## 2016-04-15 MED ORDER — EPINEPHRINE HCL 1 MG/ML IJ SOLN
0.0000 ug/min | INTRAVENOUS | Status: DC
  Filled 2016-04-15: qty 4

## 2016-04-15 MED ORDER — SODIUM CHLORIDE 0.9 % IV SOLN
INTRAVENOUS | Status: DC
  Filled 2016-04-15: qty 2.5

## 2016-04-15 MED ORDER — VANCOMYCIN HCL 10 G IV SOLR
1500.0000 mg | INTRAVENOUS | Status: AC
  Administered 2016-04-16: 1250 mg via INTRAVENOUS
  Filled 2016-04-15: qty 1500

## 2016-04-15 MED ORDER — DEXTROSE 5 % IV SOLN
0.0000 ug/min | INTRAVENOUS | Status: AC
  Administered 2016-04-16: 2 ug/min via INTRAVENOUS
  Filled 2016-04-15: qty 4

## 2016-04-15 MED ORDER — PHENYLEPHRINE HCL 10 MG/ML IJ SOLN
30.0000 ug/min | INTRAVENOUS | Status: DC
  Filled 2016-04-15: qty 2

## 2016-04-16 ENCOUNTER — Inpatient Hospital Stay (HOSPITAL_COMMUNITY)
Admission: RE | Admit: 2016-04-16 | Discharge: 2016-04-18 | DRG: 267 | Disposition: A | Discharge status: Home / Self Care | Payer: BLUE CROSS/BLUE SHIELD | Source: Ambulatory Visit | Attending: Thoracic Surgery (Cardiothoracic Vascular Surgery) | Admitting: Thoracic Surgery (Cardiothoracic Vascular Surgery)

## 2016-04-16 ENCOUNTER — Inpatient Hospital Stay (HOSPITAL_COMMUNITY): Payer: BLUE CROSS/BLUE SHIELD | Admitting: Anesthesiology

## 2016-04-16 ENCOUNTER — Encounter (HOSPITAL_COMMUNITY): Payer: Self-pay | Admitting: Surgery

## 2016-04-16 ENCOUNTER — Inpatient Hospital Stay (HOSPITAL_COMMUNITY): Payer: BLUE CROSS/BLUE SHIELD

## 2016-04-16 ENCOUNTER — Other Ambulatory Visit: Payer: Self-pay

## 2016-04-16 ENCOUNTER — Encounter (HOSPITAL_COMMUNITY)
Admission: RE | Disposition: A | Discharge status: Home / Self Care | Payer: Self-pay | Source: Ambulatory Visit | Attending: Thoracic Surgery (Cardiothoracic Vascular Surgery)

## 2016-04-16 DIAGNOSIS — I5032 Chronic diastolic (congestive) heart failure: Secondary | ICD-10-CM | POA: Diagnosis present

## 2016-04-16 DIAGNOSIS — Z952 Presence of prosthetic heart valve: Secondary | ICD-10-CM

## 2016-04-16 DIAGNOSIS — E7800 Pure hypercholesterolemia, unspecified: Secondary | ICD-10-CM | POA: Diagnosis present

## 2016-04-16 DIAGNOSIS — Z9221 Personal history of antineoplastic chemotherapy: Secondary | ICD-10-CM

## 2016-04-16 DIAGNOSIS — Z800 Family history of malignant neoplasm of digestive organs: Secondary | ICD-10-CM | POA: Diagnosis not present

## 2016-04-16 DIAGNOSIS — K5900 Constipation, unspecified: Secondary | ICD-10-CM | POA: Diagnosis present

## 2016-04-16 DIAGNOSIS — Z880 Allergy status to penicillin: Secondary | ICD-10-CM | POA: Diagnosis not present

## 2016-04-16 DIAGNOSIS — I319 Disease of pericardium, unspecified: Secondary | ICD-10-CM | POA: Diagnosis present

## 2016-04-16 DIAGNOSIS — Z7982 Long term (current) use of aspirin: Secondary | ICD-10-CM

## 2016-04-16 DIAGNOSIS — Z006 Encounter for examination for normal comparison and control in clinical research program: Secondary | ICD-10-CM

## 2016-04-16 DIAGNOSIS — I340 Nonrheumatic mitral (valve) insufficiency: Secondary | ICD-10-CM | POA: Diagnosis present

## 2016-04-16 DIAGNOSIS — I10 Essential (primary) hypertension: Secondary | ICD-10-CM | POA: Diagnosis not present

## 2016-04-16 DIAGNOSIS — I110 Hypertensive heart disease with heart failure: Secondary | ICD-10-CM | POA: Diagnosis present

## 2016-04-16 DIAGNOSIS — I350 Nonrheumatic aortic (valve) stenosis: Secondary | ICD-10-CM

## 2016-04-16 DIAGNOSIS — I352 Nonrheumatic aortic (valve) stenosis with insufficiency: Principal | ICD-10-CM | POA: Diagnosis present

## 2016-04-16 DIAGNOSIS — Y842 Radiological procedure and radiotherapy as the cause of abnormal reaction of the patient, or of later complication, without mention of misadventure at the time of the procedure: Secondary | ICD-10-CM | POA: Diagnosis present

## 2016-04-16 DIAGNOSIS — J701 Chronic and other pulmonary manifestations due to radiation: Secondary | ICD-10-CM | POA: Diagnosis present

## 2016-04-16 DIAGNOSIS — C787 Secondary malignant neoplasm of liver and intrahepatic bile duct: Secondary | ICD-10-CM | POA: Diagnosis present

## 2016-04-16 DIAGNOSIS — C259 Malignant neoplasm of pancreas, unspecified: Secondary | ICD-10-CM | POA: Diagnosis present

## 2016-04-16 DIAGNOSIS — I272 Other secondary pulmonary hypertension: Secondary | ICD-10-CM | POA: Diagnosis present

## 2016-04-16 DIAGNOSIS — Z8547 Personal history of malignant neoplasm of testis: Secondary | ICD-10-CM | POA: Diagnosis not present

## 2016-04-16 DIAGNOSIS — Z954 Presence of other heart-valve replacement: Secondary | ICD-10-CM | POA: Diagnosis not present

## 2016-04-16 DIAGNOSIS — J9811 Atelectasis: Secondary | ICD-10-CM

## 2016-04-16 DIAGNOSIS — R0602 Shortness of breath: Secondary | ICD-10-CM | POA: Diagnosis present

## 2016-04-16 HISTORY — DX: Presence of prosthetic heart valve: Z95.2

## 2016-04-16 HISTORY — PX: TEE WITHOUT CARDIOVERSION: SHX5443

## 2016-04-16 HISTORY — PX: TRANSCATHETER AORTIC VALVE REPLACEMENT, TRANSFEMORAL: SHX6400

## 2016-04-16 LAB — POCT I-STAT, CHEM 8
BUN: 21 mg/dL — ABNORMAL HIGH (ref 6–20)
BUN: 21 mg/dL — AB (ref 6–20)
BUN: 20 mg/dL (ref 6–20)
CHLORIDE: 100 mmol/L — AB (ref 101–111)
CREATININE: 0.6 mg/dL — AB (ref 0.61–1.24)
CREATININE: 0.6 mg/dL — AB (ref 0.61–1.24)
Calcium, Ion: 1.19 mmol/L (ref 1.15–1.40)
Calcium, Ion: 1.14 mmol/L — ABNORMAL LOW (ref 1.15–1.40)
Calcium, Ion: 1.11 mmol/L — ABNORMAL LOW (ref 1.15–1.40)
Chloride: 99 mmol/L — ABNORMAL LOW (ref 101–111)
Chloride: 99 mmol/L — ABNORMAL LOW (ref 101–111)
Creatinine, Ser: 0.7 mg/dL (ref 0.61–1.24)
Glucose, Bld: 104 mg/dL — ABNORMAL HIGH (ref 65–99)
Glucose, Bld: 167 mg/dL — ABNORMAL HIGH (ref 65–99)
Glucose, Bld: 186 mg/dL — ABNORMAL HIGH (ref 65–99)
HEMATOCRIT: 42 % (ref 39.0–52.0)
HEMATOCRIT: 41 % (ref 39.0–52.0)
HEMATOCRIT: 40 % (ref 39.0–52.0)
HEMOGLOBIN: 14.3 g/dL (ref 13.0–17.0)
HEMOGLOBIN: 13.6 g/dL (ref 13.0–17.0)
Hemoglobin: 13.9 g/dL (ref 13.0–17.0)
POTASSIUM: 4.1 mmol/L (ref 3.5–5.1)
POTASSIUM: 4.6 mmol/L (ref 3.5–5.1)
POTASSIUM: 5 mmol/L (ref 3.5–5.1)
Sodium: 139 mmol/L (ref 135–145)
Sodium: 136 mmol/L (ref 135–145)
Sodium: 135 mmol/L (ref 135–145)
TCO2: 30 mmol/L (ref 0–100)
TCO2: 30 mmol/L (ref 0–100)
TCO2: 29 mmol/L (ref 0–100)

## 2016-04-16 LAB — CBC
HEMATOCRIT: 40.3 % (ref 39.0–52.0)
HEMOGLOBIN: 13.6 g/dL (ref 13.0–17.0)
MCH: 31.3 pg (ref 26.0–34.0)
MCHC: 33.7 g/dL (ref 30.0–36.0)
MCV: 92.6 fL (ref 78.0–100.0)
Platelets: 122 10*3/uL — ABNORMAL LOW (ref 150–400)
RBC: 4.35 MIL/uL (ref 4.22–5.81)
RDW: 12.4 % (ref 11.5–15.5)
WBC: 6.4 10*3/uL (ref 4.0–10.5)

## 2016-04-16 LAB — POCT I-STAT 3, ART BLOOD GAS (G3+)
ACID-BASE EXCESS: 4 mmol/L — AB (ref 0.0–2.0)
Acid-Base Excess: 3 mmol/L — ABNORMAL HIGH (ref 0.0–2.0)
BICARBONATE: 30.4 mmol/L — AB (ref 20.0–28.0)
BICARBONATE: 30 mmol/L — AB (ref 20.0–28.0)
O2 Saturation: 100 %
O2 Saturation: 96 %
PO2 ART: 489 mmHg — AB (ref 83.0–108.0)
TCO2: 32 mmol/L (ref 0–100)
TCO2: 32 mmol/L (ref 0–100)
pCO2 arterial: 53.1 mmHg — ABNORMAL HIGH (ref 32.0–48.0)
pCO2 arterial: 54.9 mmHg — ABNORMAL HIGH (ref 32.0–48.0)
pH, Arterial: 7.367 (ref 7.350–7.450)
pH, Arterial: 7.345 — ABNORMAL LOW (ref 7.350–7.450)
pO2, Arterial: 86 mmHg (ref 83.0–108.0)

## 2016-04-16 LAB — ECHO INTRAOPERATIVE TEE
HEIGHTINCHES: 70 in
WEIGHTICAEL: 2974 [oz_av]

## 2016-04-16 LAB — POCT I-STAT 4, (NA,K, GLUC, HGB,HCT)
Glucose, Bld: 127 mg/dL — ABNORMAL HIGH (ref 65–99)
HEMATOCRIT: 40 % (ref 39.0–52.0)
Hemoglobin: 13.6 g/dL (ref 13.0–17.0)
Potassium: 4.2 mmol/L (ref 3.5–5.1)
SODIUM: 139 mmol/L (ref 135–145)

## 2016-04-16 LAB — APTT: APTT: 34 s (ref 24–36)

## 2016-04-16 LAB — PROTIME-INR
INR: 1.3
Prothrombin Time: 16.3 seconds — ABNORMAL HIGH (ref 11.4–15.2)

## 2016-04-16 SURGERY — IMPLANTATION, AORTIC VALVE, TRANSCATHETER, FEMORAL APPROACH
Anesthesia: General | Site: Chest

## 2016-04-16 MED ORDER — PHENYLEPHRINE HCL 10 MG/ML IJ SOLN
0.0000 ug/min | INTRAVENOUS | Status: DC
  Filled 2016-04-16: qty 2

## 2016-04-16 MED ORDER — DIPHENHYDRAMINE HCL 50 MG/ML IJ SOLN
INTRAMUSCULAR | Status: DC | PRN
  Administered 2016-04-16: 25 mg via INTRAVENOUS

## 2016-04-16 MED ORDER — PROTAMINE SULFATE 10 MG/ML IV SOLN
INTRAVENOUS | Status: DC | PRN
  Administered 2016-04-16: 20 mg via INTRAVENOUS
  Administered 2016-04-16: 30 mg via INTRAVENOUS
  Administered 2016-04-16: 10 mg via INTRAVENOUS
  Administered 2016-04-16 (×2): 20 mg via INTRAVENOUS

## 2016-04-16 MED ORDER — ALBUMIN HUMAN 5 % IV SOLN: 250.0000 mL | INTRAVENOUS | Status: DC | PRN

## 2016-04-16 MED ORDER — ARTIFICIAL TEARS OP OINT
TOPICAL_OINTMENT | OPHTHALMIC | Status: DC | PRN
  Administered 2016-04-16: 1 via OPHTHALMIC

## 2016-04-16 MED ORDER — METOPROLOL TARTRATE 5 MG/5ML IV SOLN: 2.5000 mg | INTRAVENOUS | Status: DC | PRN

## 2016-04-16 MED ORDER — LIDOCAINE HCL (CARDIAC) 20 MG/ML IV SOLN
INTRAVENOUS | Status: DC | PRN
  Administered 2016-04-16: 100 mg via INTRATRACHEAL

## 2016-04-16 MED ORDER — PROTAMINE SULFATE 10 MG/ML IV SOLN
INTRAVENOUS | Status: AC
  Filled 2016-04-16: qty 10

## 2016-04-16 MED ORDER — SUGAMMADEX SODIUM 200 MG/2ML IV SOLN
INTRAVENOUS | Status: DC | PRN
  Administered 2016-04-16: 200 mg via INTRAVENOUS

## 2016-04-16 MED ORDER — ASPIRIN EC 81 MG PO TBEC
81.0000 mg | DELAYED_RELEASE_TABLET | Freq: Every day | ORAL | Status: DC
  Administered 2016-04-17 – 2016-04-18 (×2): 81 mg via ORAL
  Filled 2016-04-16 (×2): qty 1

## 2016-04-16 MED ORDER — VANCOMYCIN HCL IN DEXTROSE 1-5 GM/200ML-% IV SOLN
1000.0000 mg | Freq: Once | INTRAVENOUS | Status: AC
  Administered 2016-04-16: 1000 mg via INTRAVENOUS
  Filled 2016-04-16: qty 200

## 2016-04-16 MED ORDER — CHLORHEXIDINE GLUCONATE 4 % EX LIQD: 30.0000 mL | CUTANEOUS | Status: DC

## 2016-04-16 MED ORDER — CHLORHEXIDINE GLUCONATE 0.12 % MT SOLN: 15.0000 mL | Freq: Once | OROMUCOSAL | Status: DC

## 2016-04-16 MED ORDER — HEPARIN SODIUM (PORCINE) 1000 UNIT/ML IJ SOLN
INTRAMUSCULAR | Status: AC
  Filled 2016-04-16: qty 1

## 2016-04-16 MED ORDER — HEPARIN SODIUM (PORCINE) 1000 UNIT/ML IJ SOLN
INTRAMUSCULAR | Status: DC | PRN
  Administered 2016-04-16: 13000 [IU] via INTRAVENOUS

## 2016-04-16 MED ORDER — PHENYLEPHRINE HCL 10 MG/ML IJ SOLN
INTRAMUSCULAR | Status: DC | PRN
  Administered 2016-04-16 (×2): 40 ug via INTRAVENOUS

## 2016-04-16 MED ORDER — LACTATED RINGERS IV SOLN: 500.0000 mL | Freq: Once | INTRAVENOUS | Status: DC | PRN

## 2016-04-16 MED ORDER — SODIUM CHLORIDE 0.9 % IV SOLN
1.0000 mL/kg/h | INTRAVENOUS | Status: AC
  Administered 2016-04-16: 1 mL/kg/h via INTRAVENOUS

## 2016-04-16 MED ORDER — METOPROLOL TARTRATE 25 MG/10 ML ORAL SUSPENSION: 12.5000 mg | Freq: Two times a day (BID) | ORAL | Status: DC

## 2016-04-16 MED ORDER — CHLORHEXIDINE GLUCONATE 0.12 % MT SOLN
15.0000 mL | OROMUCOSAL | Status: AC
  Administered 2016-04-16: 15 mL via OROMUCOSAL

## 2016-04-16 MED ORDER — ONDANSETRON HCL 4 MG/2ML IJ SOLN
INTRAMUSCULAR | Status: DC | PRN
  Administered 2016-04-16: 4 mg via INTRAVENOUS

## 2016-04-16 MED ORDER — CHLORHEXIDINE GLUCONATE 4 % EX LIQD: 60.0000 mL | Freq: Once | CUTANEOUS | Status: DC

## 2016-04-16 MED ORDER — FAMOTIDINE IN NACL 20-0.9 MG/50ML-% IV SOLN
20.0000 mg | Freq: Two times a day (BID) | INTRAVENOUS | Status: DC
  Filled 2016-04-16: qty 50

## 2016-04-16 MED ORDER — SODIUM CHLORIDE 0.9 % IV SOLN: INTRAVENOUS | Status: DC

## 2016-04-16 MED ORDER — LACTATED RINGERS IV SOLN
INTRAVENOUS | Status: DC | PRN
  Administered 2016-04-16 (×3): via INTRAVENOUS

## 2016-04-16 MED ORDER — CLOPIDOGREL BISULFATE 75 MG PO TABS
75.0000 mg | ORAL_TABLET | Freq: Every day | ORAL | Status: DC
  Administered 2016-04-17 – 2016-04-18 (×2): 75 mg via ORAL
  Filled 2016-04-16 (×2): qty 1

## 2016-04-16 MED ORDER — IODIXANOL 320 MG/ML IV SOLN
INTRAVENOUS | Status: DC | PRN
  Administered 2016-04-16: 60.4 mL via INTRAVENOUS

## 2016-04-16 MED ORDER — PROPOFOL 10 MG/ML IV BOLUS
INTRAVENOUS | Status: DC | PRN
  Administered 2016-04-16: 50 mg via INTRAVENOUS

## 2016-04-16 MED ORDER — PROPOFOL 10 MG/ML IV BOLUS
INTRAVENOUS | Status: AC
  Filled 2016-04-16: qty 20

## 2016-04-16 MED ORDER — PHENYLEPHRINE HCL 10 MG/ML IJ SOLN
INTRAVENOUS | Status: DC | PRN
  Administered 2016-04-16: 25 ug/min via INTRAVENOUS

## 2016-04-16 MED ORDER — METOPROLOL TARTRATE 12.5 MG HALF TABLET
12.5000 mg | ORAL_TABLET | Freq: Two times a day (BID) | ORAL | Status: DC
  Administered 2016-04-16 – 2016-04-18 (×4): 12.5 mg via ORAL
  Filled 2016-04-16 (×4): qty 1

## 2016-04-16 MED ORDER — MIDAZOLAM HCL 2 MG/2ML IJ SOLN
INTRAMUSCULAR | Status: DC | PRN
  Administered 2016-04-16: 2 mg via INTRAVENOUS

## 2016-04-16 MED ORDER — SIMVASTATIN 20 MG PO TABS
20.0000 mg | ORAL_TABLET | Freq: Every day | ORAL | Status: DC
  Administered 2016-04-17: 20 mg via ORAL
  Filled 2016-04-16: qty 1

## 2016-04-16 MED ORDER — LEVOFLOXACIN IN D5W 750 MG/150ML IV SOLN
750.0000 mg | INTRAVENOUS | Status: AC
  Administered 2016-04-17: 750 mg via INTRAVENOUS
  Filled 2016-04-16: qty 150

## 2016-04-16 MED ORDER — SODIUM CHLORIDE 0.9 % IV SOLN: 1.0000 mL/kg/h | INTRAVENOUS | Status: AC

## 2016-04-16 MED ORDER — FENTANYL CITRATE (PF) 250 MCG/5ML IJ SOLN
INTRAMUSCULAR | Status: AC
  Filled 2016-04-16: qty 5

## 2016-04-16 MED ORDER — PANTOPRAZOLE SODIUM 40 MG PO TBEC
40.0000 mg | DELAYED_RELEASE_TABLET | Freq: Every day | ORAL | Status: DC
  Administered 2016-04-18: 40 mg via ORAL
  Filled 2016-04-16: qty 1

## 2016-04-16 MED ORDER — MIDAZOLAM HCL 2 MG/2ML IJ SOLN
INTRAMUSCULAR | Status: AC
  Filled 2016-04-16: qty 2

## 2016-04-16 MED ORDER — TRAMADOL HCL 50 MG PO TABS
50.0000 mg | ORAL_TABLET | ORAL | Status: DC | PRN
  Administered 2016-04-17: 50 mg via ORAL
  Filled 2016-04-16: qty 1

## 2016-04-16 MED ORDER — MORPHINE SULFATE (PF) 2 MG/ML IV SOLN: 1.0000 mg | INTRAVENOUS | Status: DC | PRN

## 2016-04-16 MED ORDER — NITROGLYCERIN IN D5W 200-5 MCG/ML-% IV SOLN: 0.0000 ug/min | INTRAVENOUS | Status: DC

## 2016-04-16 MED ORDER — SODIUM CHLORIDE 0.9 % IV SOLN
INTRAVENOUS | Status: DC | PRN
  Administered 2016-04-16: 500 mL

## 2016-04-16 MED ORDER — OXYCODONE HCL 5 MG PO TABS
5.0000 mg | ORAL_TABLET | ORAL | Status: DC | PRN
Start: 2016-04-16 — End: 2016-04-18

## 2016-04-16 MED ORDER — FENTANYL CITRATE (PF) 250 MCG/5ML IJ SOLN
INTRAMUSCULAR | Status: DC | PRN
  Administered 2016-04-16 (×2): 50 ug via INTRAVENOUS
  Administered 2016-04-16: 150 ug via INTRAVENOUS

## 2016-04-16 MED ORDER — ONDANSETRON HCL 4 MG/2ML IJ SOLN: 4.0000 mg | Freq: Four times a day (QID) | INTRAMUSCULAR | Status: DC | PRN

## 2016-04-16 MED ORDER — 0.9 % SODIUM CHLORIDE (POUR BTL) OPTIME
TOPICAL | Status: DC | PRN
  Administered 2016-04-16: 1000 mL

## 2016-04-16 MED FILL — Heparin Sodium (Porcine) Inj 1000 Unit/ML: INTRAMUSCULAR | Qty: 30 | Status: AC

## 2016-04-16 MED FILL — Potassium Chloride Inj 2 mEq/ML: INTRAVENOUS | Qty: 40 | Status: AC

## 2016-04-16 MED FILL — Magnesium Sulfate Inj 50%: INTRAMUSCULAR | Qty: 10 | Status: AC

## 2016-04-16 SURGICAL SUPPLY — 102 items
ADAPTER UNIV SWAN GANZ BIP (ADAPTER) ×2 IMPLANT
ADAPTER UNV SWAN GANZ BIP (ADAPTER) ×1
ADH SKN CLS APL DERMABOND .7 (GAUZE/BANDAGES/DRESSINGS) ×2
ADPR CATH UNV NS SG CATH (ADAPTER) ×2
ATTRACTOMAT 16X20 MAGNETIC DRP (DRAPES) IMPLANT
BAG BANDED W/RUBBER/TAPE 36X54 (MISCELLANEOUS) ×3 IMPLANT
BAG DECANTER FOR FLEXI CONT (MISCELLANEOUS) IMPLANT
BAG EQP BAND 135X91 W/RBR TAPE (MISCELLANEOUS) ×2
BAG SNAP BAND KOVER 36X36 (MISCELLANEOUS) ×6 IMPLANT
BLADE 10 SAFETY STRL DISP (BLADE) ×3 IMPLANT
BLADE STERNUM SYSTEM 6 (BLADE) ×3 IMPLANT
BLADE SURG ROTATE 9660 (MISCELLANEOUS) IMPLANT
CABLE PACING FASLOC BIEGE (MISCELLANEOUS) ×3 IMPLANT
CABLE PACING FASLOC BLUE (MISCELLANEOUS) ×3 IMPLANT
CANISTER SUCTION 2500CC (MISCELLANEOUS) IMPLANT
CANNULA FEM VENOUS REMOTE 22FR (CANNULA) IMPLANT
CANNULA OPTISITE PERFUSION 16F (CANNULA) IMPLANT
CANNULA OPTISITE PERFUSION 18F (CANNULA) IMPLANT
CATH DIAG EXPO 6F VENT PIG 145 (CATHETERS) ×6 IMPLANT
CATH EXPO 5FR AL1 (CATHETERS) ×3 IMPLANT
CATH S G BIP PACING (SET/KITS/TRAYS/PACK) ×6 IMPLANT
CLIP TI MEDIUM 24 (CLIP) ×3 IMPLANT
CLIP TI WIDE RED SMALL 24 (CLIP) ×3 IMPLANT
CONT SPEC 4OZ CLIKSEAL STRL BL (MISCELLANEOUS) ×18 IMPLANT
COVER DOME SNAP 22 D (MISCELLANEOUS) ×3 IMPLANT
COVER MAYO STAND STRL (DRAPES) ×3 IMPLANT
COVER TABLE BACK 60X90 (DRAPES) ×3 IMPLANT
CRADLE DONUT ADULT HEAD (MISCELLANEOUS) ×3 IMPLANT
DERMABOND ADVANCED (GAUZE/BANDAGES/DRESSINGS) ×1
DERMABOND ADVANCED .7 DNX12 (GAUZE/BANDAGES/DRESSINGS) ×2 IMPLANT
DEVICE CLOSURE PERCLS PRGLD 6F (VASCULAR PRODUCTS) IMPLANT
DRAPE INCISE IOBAN 66X45 STRL (DRAPES) IMPLANT
DRAPE SLUSH MACHINE 52X66 (DRAPES) ×3 IMPLANT
DRAPE TABLE COVER HEAVY DUTY (DRAPES) ×3 IMPLANT
DRSG TEGADERM 4X4.75 (GAUZE/BANDAGES/DRESSINGS) ×3 IMPLANT
ELECT REM PT RETURN 9FT ADLT (ELECTROSURGICAL) ×6
ELECTRODE REM PT RTRN 9FT ADLT (ELECTROSURGICAL) ×4 IMPLANT
FELT TEFLON 6X6 (MISCELLANEOUS) ×3 IMPLANT
FEMORAL VENOUS CANN RAP (CANNULA) IMPLANT
GAUZE SPONGE 4X4 12PLY STRL (GAUZE/BANDAGES/DRESSINGS) ×3 IMPLANT
GLOVE BIO SURGEON STRL SZ7.5 (GLOVE) ×3 IMPLANT
GLOVE BIO SURGEON STRL SZ8 (GLOVE) ×6 IMPLANT
GLOVE EUDERMIC 7 POWDERFREE (GLOVE) ×3 IMPLANT
GLOVE ORTHO TXT STRL SZ7.5 (GLOVE) ×3 IMPLANT
GOWN STRL REUS W/ TWL LRG LVL3 (GOWN DISPOSABLE) ×6 IMPLANT
GOWN STRL REUS W/ TWL XL LVL3 (GOWN DISPOSABLE) ×12 IMPLANT
GOWN STRL REUS W/TWL LRG LVL3 (GOWN DISPOSABLE) ×9
GOWN STRL REUS W/TWL XL LVL3 (GOWN DISPOSABLE) ×18
GUIDEWIRE SAF TJ AMPL .035X180 (WIRE) ×3 IMPLANT
GUIDEWIRE SAFE TJ AMPLATZ EXST (WIRE) ×3 IMPLANT
GUIDEWIRE STRAIGHT .035 260CM (WIRE) ×3 IMPLANT
INSERT FOGARTY 61MM (MISCELLANEOUS) ×3 IMPLANT
INSERT FOGARTY SM (MISCELLANEOUS) ×6 IMPLANT
INSERT FOGARTY XLG (MISCELLANEOUS) IMPLANT
KIT BASIN OR (CUSTOM PROCEDURE TRAY) ×3 IMPLANT
KIT DILATOR VASC 18G NDL (KITS) IMPLANT
KIT HEART LEFT (KITS) ×3 IMPLANT
KIT ROOM TURNOVER OR (KITS) ×3 IMPLANT
KIT SUCTION CATH 14FR (SUCTIONS) ×6 IMPLANT
NDL PERC 18GX7CM (NEEDLE) ×2 IMPLANT
NEEDLE PERC 18GX7CM (NEEDLE) ×3 IMPLANT
NS IRRIG 1000ML POUR BTL (IV SOLUTION) ×9 IMPLANT
PACK AORTA (CUSTOM PROCEDURE TRAY) ×3 IMPLANT
PAD ARMBOARD 7.5X6 YLW CONV (MISCELLANEOUS) ×6 IMPLANT
PAD ELECT DEFIB RADIOL ZOLL (MISCELLANEOUS) ×3 IMPLANT
PATCH TACHOSII LRG 9.5X4.8 (VASCULAR PRODUCTS) IMPLANT
PERCLOSE PROGLIDE 6F (VASCULAR PRODUCTS) ×6
SET MICROPUNCTURE 5F STIFF (MISCELLANEOUS) ×3 IMPLANT
SHEATH AVANTI 11CM 8FR (MISCELLANEOUS) ×3 IMPLANT
SHEATH PINNACLE 6F 10CM (SHEATH) ×6 IMPLANT
SLEEVE REPOSITIONING LENGTH 30 (MISCELLANEOUS) ×3 IMPLANT
SPONGE GAUZE 4X4 12PLY STER LF (GAUZE/BANDAGES/DRESSINGS) ×1 IMPLANT
SPONGE LAP 4X18 X RAY DECT (DISPOSABLE) ×3 IMPLANT
STOPCOCK MORSE 400PSI 3WAY (MISCELLANEOUS) ×18 IMPLANT
SUT ETHIBOND X763 2 0 SH 1 (SUTURE) ×3 IMPLANT
SUT GORETEX CV 4 TH 22 36 (SUTURE) ×3 IMPLANT
SUT GORETEX CV4 TH-18 (SUTURE) ×9 IMPLANT
SUT GORETEX TH-18 36 INCH (SUTURE) ×6 IMPLANT
SUT MNCRL AB 3-0 PS2 18 (SUTURE) ×3 IMPLANT
SUT PROLENE 3 0 SH1 36 (SUTURE) IMPLANT
SUT PROLENE 4 0 RB 1 (SUTURE) ×3
SUT PROLENE 4-0 RB1 .5 CRCL 36 (SUTURE) ×2 IMPLANT
SUT PROLENE 5 0 C 1 36 (SUTURE) ×6 IMPLANT
SUT PROLENE 6 0 C 1 30 (SUTURE) ×6 IMPLANT
SUT SILK  1 MH (SUTURE) ×1
SUT SILK 1 MH (SUTURE) ×2 IMPLANT
SUT SILK 2 0 SH CR/8 (SUTURE) IMPLANT
SUT VIC AB 2-0 CT1 27 (SUTURE) ×3
SUT VIC AB 2-0 CT1 TAPERPNT 27 (SUTURE) ×2 IMPLANT
SUT VIC AB 2-0 CTX 36 (SUTURE) IMPLANT
SUT VIC AB 3-0 SH 8-18 (SUTURE) ×6 IMPLANT
SYR 30ML LL (SYRINGE) ×6 IMPLANT
SYR 50ML LL SCALE MARK (SYRINGE) ×3 IMPLANT
SYRINGE 10CC LL (SYRINGE) ×9 IMPLANT
TOWEL OR 17X26 10 PK STRL BLUE (TOWEL DISPOSABLE) ×6 IMPLANT
TRANSDUCER W/STOPCOCK (MISCELLANEOUS) ×6 IMPLANT
TRAY FOLEY IC TEMP SENS 14FR (CATHETERS) ×3 IMPLANT
TUBE SUCT INTRACARD DLP 20F (MISCELLANEOUS) IMPLANT
TUBING HIGH PRESSURE 120CM (CONNECTOR) ×3 IMPLANT
VALVE HEART TRANSCATH SZ3 26MM (Prosthesis & Implant Heart) ×1 IMPLANT
WIRE AMPLATZ SS-J .035X180CM (WIRE) ×3 IMPLANT
WIRE BENTSON .035X145CM (WIRE) ×3 IMPLANT

## 2016-04-16 NOTE — Interval H&P Note (Signed)
History and Physical Interval Note:  04/16/2016 7:18 AM  Ronald Thompson  has presented today for surgery, with the diagnosis of SEVERE AS  The various methods of treatment have been discussed with the patient and family. After consideration of risks, benefits and other options for treatment, the patient has consented to  Procedure(s): TRANSCATHETER AORTIC VALVE REPLACEMENT, TRANSFEMORAL (N/A) TRANSESOPHAGEAL ECHOCARDIOGRAM (TEE) (N/A) as a surgical intervention .  The patient's history has been reviewed, patient examined, no change in status, stable for surgery.  I have reviewed the patient's chart and labs.  Questions were answered to the patient's satisfaction.     Rexene Alberts

## 2016-04-16 NOTE — Op Note (Signed)
[facility]   MULTIDISCIPLINARY HEART VALVE TEAM   TAVR OPERATIVE NOTE   Date of Procedure:  04/16/2016  Preoperative Diagnosis: Severe Aortic Stenosis   Postoperative Diagnosis: Same   Procedure:    Transcatheter Aortic Valve Replacement - Percutaneous Right Transfemoral Approach  Edwards Sapien 3 THV (size 26 mm, model # 9600TFX, serial # [id number])   Co-Surgeons:  Valentina Gu. 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   The patient originally presented in 1990 with stage IV testicular seminoma associated with metastatic involvement of the mediastinum and left chest. He underwent intensive chemotherapy and radiation therapy to the chest and mediastinum. 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 He also developed pulmonary fibrosis and pericarditis secondary to radiation therapy.  He later developed aortic stenosis which has slowly progressed over time for which the patient has been  followed by Dr. Acie Fredrickson. In 2015 he was diagnosed with primary neuroendocrine tumor of the pancreas with metastatic involvement of the liver. He has been treated using Sandostatin and disease has been stable with very slow progression over the past 2 years. During this period of time the patient has developed progressive exertional shortness of breath and fatigue. Follow-up echocardiogram have revealed progression in severity of aortic stenosis. Most recent echocardiogram performed 12/06/2015 revealed severe aortic stenosis with peak velocity across the aortic valve measured between 3.7 and 4.1 m/s corresponding to mean transvalvular gradient estimated at least 39 mmHg, dimensionless velocity index only 0.27, and aortic valve area calculated 0.85 cm. There is moderate aortic insufficiency.  Left ventricular systolic function remains normal. The patient was referred Dr. Burt Knack and underwent diagnostic cardiac catheterization on 01/24/2016. The patient was found to have no significant coronary artery disease.  The patient was subsequently referred for surgical consultation and evaluated by Dr. Cyndia Bent on 02/08/2016. The patient was felt to be at least moderate risk for conventional surgical aortic valve replacement and CT angiography has been performed to evaluate the feasibility of transcatheter aortic valve replacement as an alternative. The patient has been referred for second surgical opinion.  The patient has been seen in consultation and counseled at length regarding the indications, risks and potential benefits of surgery.  All questions have been answered, and the patient provides full informed consent for the operation as described.    DETAILS OF THE OPERATIVE PROCEDURE  PREPARATION:    The patient is brought to the operating room on the above mentioned date and central monitoring was established by the anesthesia team including placement of Swan-Ganz catheter and radial arterial line. The patient is  placed in the supine position on the operating table.  Intravenous antibiotics are administered.  General endotracheal  anesthesia is induced uneventfully.  A Foley catheter is placed.  Baseline transesophageal echocardiogram was performed. The patient's chest, abdomen, both groins, and both lower extremities are prepared and draped in a sterile manner. A time out procedure is performed.   PERIPHERAL ACCESS:    Using the modified Seldinger technique, femoral arterial and venous access was obtained with placement of 6 Fr sheaths on the left side.  A pigtail diagnostic catheter was passed through the left arterial sheath under fluoroscopic guidance into the aortic root.  A temporary transvenous pacemaker catheter was passed through the left femoral venous sheath under fluoroscopic guidance into the right ventricle.  The pacemaker was tested to ensure stable lead placement and pacemaker capture. Aortic root angiography was performed in order to determine the optimal angiographic angle for valve deployment.   TRANSFEMORAL ACCESS:   Percutaneous transfemoral access and sheath placement was performed by Dr Burt Knack. Please see his separate operative note for details. The patient was heparinized systemically and ACT verified > 250 seconds.    A 14 Fr transfemoral E-sheath was introduced into the right common femoral artery after progressively dilating over an Amplatz superstiff wire. An AL-2 catheter was used to direct a straight-tip exchange length wire across the native aortic valve into the left ventricle. This was exchanged out for a pigtail catheter and position was confirmed in the LV apex. Simultaneous LV and Ao pressures were recorded.  The pigtail catheter was exchanged for an Amplatz Extra-stiff wire in the LV apex.  Echocardiography was utilized to confirm appropriate wire position and no sign of entanglement in the mitral subvalvular apparatus.   TRANSCATHETER HEART VALVE DEPLOYMENT:   An  Edwards Sapien 3 transcatheter heart valve (size 26 mm, model #9600TFX, serial [id number]) was prepared and crimped per manufacturer's guidelines, and the proper orientation of the valve is confirmed on the [employer] delivery system. The valve was advanced through the introducer sheath using normal technique until in an appropriate position in the abdominal aorta beyond the sheath tip. The balloon was then retracted and using the fine-tuning wheel was centered on the valve. The valve was then advanced across the aortic arch using appropriate flexion of the catheter. The valve was carefully positioned across the aortic valve annulus. The Commander catheter was retracted using normal technique. Once final position of the valve has been confirmed by angiographic assessment, the valve is deployed while temporarily holding ventilation and during rapid ventricular pacing to maintain systolic blood pressure < 50 mmHg and pulse pressure < 10 mmHg. The balloon inflation is held for >3 seconds after reaching full deployment volume. The patient's hemodynamic recovery following valve deployment is good.    Once the balloon has fully deflated the balloon is retracted into the ascending aorta and valve function is assessed using echocardiography. There is felt to be mild paravalvular leak and no central aortic insufficiency.  A total of 2 mL of additional saline was introduced into the Atrion balloon syringe and the balloon readvanced over the guidewire into the valve.  The valve was post-dilated during rapid ventricular pacing.  The patient's hemodynamic recovery following valve deployment is good.  Repeat TEE reveals significant improvement with trivial paravalvular leak.  The deployment balloon and guidewire are both removed. Echo demostrated acceptable post-procedural gradients, stable mitral valve function, and trivial aortic insufficiency. Aortography confirmed trivial aortic insufficiency.    PROCEDURE COMPLETION:    The sheath was removed and femoral artery closure performed by Dr Burt Knack. Please see his separate report for details.  Protamine was administered once femoral arterial repair was complete. The temporary pacemaker, pigtail catheters and femoral sheaths were removed with manual pressure used for hemostasis.   The patient tolerated the procedure well and is transported to the surgical intensive care in stable condition. There were no immediate intraoperative complications. All sponge instrument and needle counts are verified correct at completion of the operation.   No blood products were administered during the operation.  The patient received a total of 60.4 mL of intravenous contrast during the procedure.   Rexene Alberts, MD 04/16/2016 9:29 AM

## 2016-04-16 NOTE — Anesthesia Postprocedure Evaluation (Signed)
Anesthesia Post Note  Patient: Bonney Leitz  Procedure(s) Performed: Procedure(s) (LRB): TRANSCATHETER AORTIC VALVE REPLACEMENT, TRANSFEMORAL (N/A) TRANSESOPHAGEAL ECHOCARDIOGRAM (TEE) (N/A)  Patient location during evaluation: SICU Anesthesia Type: General Level of consciousness: sedated Pain management: pain level controlled Vital Signs Assessment: post-procedure vital signs reviewed and stable Respiratory status: patient remains intubated per anesthesia plan Cardiovascular status: stable Anesthetic complications: no    Last Vitals:  Vitals:   04/16/16 1600 04/16/16 1631  BP: 124/83   Pulse: 64   Resp: 15   Temp:  36.5 C    Last Pain:  Vitals:   04/16/16 1631  TempSrc: Oral  PainSc:                  Sydne Krahl DAVID

## 2016-04-16 NOTE — Anesthesia Procedure Notes (Signed)
Procedure Name: Intubation Date/Time: 04/16/2016 7:53 AM Performed by: Mariea Clonts Pre-anesthesia Checklist: Emergency Drugs available, Patient identified, Timeout performed, Suction available and Patient being monitored Patient Re-evaluated:Patient Re-evaluated prior to inductionOxygen Delivery Method: Circle system utilized Preoxygenation: Pre-oxygenation with 100% oxygen Intubation Type: IV induction Ventilation: Mask ventilation without difficulty and Oral airway inserted - appropriate to patient size Laryngoscope Size: Sabra Heck and 2 Grade View: Grade II Tube type: Oral Tube size: 7.5 mm Number of attempts: 1 Placement Confirmation: ETT inserted through vocal cords under direct vision,  positive ETCO2 and breath sounds checked- equal and bilateral Tube secured with: Tape Dental Injury: Teeth and Oropharynx as per pre-operative assessment

## 2016-04-16 NOTE — H&P (View-Only) (Signed)
[facility]  MULTIDISCIPLINARY HEART VALVE CLINIC  CARDIOTHORACIC SURGERY CONSULTATION REPORT  Referring Provider is Ronald Borg, MD PCP is Ronald Cower, MD  Chief Complaint  Patient presents with  . Aortic Stenosis    2ND TAVR eval...has seen Dr. 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   The patient originally presented in 1990 with stage IV testicular seminoma associated with metastatic involvement of the mediastinum and left chest. He underwent intensive chemotherapy and radiation therapy to the chest and mediastinum. 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 He also developed pulmonary fibrosis and pericarditis secondary to radiation therapy.  He later developed aortic stenosis which has slowly progressed over time for which the patient has been followed by Dr. Acie Thompson. In 2015 he was diagnosed with primary neuroendocrine tumor of the pancreas with metastatic involvement of the liver. He has been treated using Sandostatin and disease has been stable with very slow progression over the past 2 years. During this period of time the patient has developed progressive exertional shortness of breath and fatigue. Follow-up echocardiogram have revealed progression in severity of aortic stenosis. Most recent echocardiogram performed 12/06/2015 revealed severe aortic stenosis with peak velocity across the aortic valve measured between 3.7 and  4.1 m/s corresponding to mean transvalvular gradient estimated at least 39 mmHg, dimensionless velocity index only 0.27, and aortic valve area calculated 0.85 cm. There is moderate aortic insufficiency.  Left ventricular systolic function remains normal. The patient was referred Dr. Burt Thompson and underwent diagnostic cardiac catheterization on 01/24/2016. The patient was found to have no significant coronary artery disease.  The patient was subsequently referred for surgical consultation and evaluated by Dr. Cyndia Thompson on 02/08/2016. The patient was felt to be at least moderate risk for conventional surgical aortic valve replacement and CT angiography has been performed to evaluate the feasibility of transcatheter aortic valve replacement as an alternative. The patient has been referred for second surgical opinion.  The patient is married and lives locally in [place] with his wife. He has 3 grown children and numerous grandchildren. 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 The patient has been physically active all of his adult life and has enjoyed regular exercise up until recently. However, over the past few years the patient has experienced slow gradual progressive decline in his exercise tolerance secondary to exertional shortness of breath and fatigue. The patient now gets short of breath with moderate activity such as walking up a flight of stairs. He occasionally wakes up short of breath at night but typically he can lay flat in bed. He denies any history of resting shortness of breath, orthopnea, dizzy spells, or syncope. He has never had any chest pain or chest tightness either with activity or at rest. He has not had any pain in his abdomen or back which might be attributed to his known malignancy.  Appetite is stable and the patient has not been losing weight.    Past Medical History:  Diagnosis Date  .  Baker's cyst   . Heart murmur   .  Hypercholesteremia   . Hypertension   . Lesion of left lung    hx.25 yrs ago- testicilar cancer related- only scarring left after tx.-no problems now  . Pericarditis    2nd to tumor  . Primary neuroendocrine tumor of pancreas 02/14/2014   Stage IV with multiple mets to liver  . Pulmonary fibrosis ([place]) 11/08/2015  . Testicular seminoma ([place]) 1990   with metatstatic spread - good response to therapy-radiation and chemotherapy  . Transfusion history    hx. 25 yrs ago-during cancer tx.    Past Surgical History:  Procedure Laterality Date  . BACK SURGERY     '14- rupt. disc   . CARDIAC CATHETERIZATION N/A 01/24/2016   Procedure: Right/Left Heart Cath and Coronary Angiography;  Surgeon: Ronald Mocha, MD;  Location: [facility];  Service: Cardiovascular;  Laterality: N/A;  . EUS N/A 02/10/2014   Procedure: UPPER ENDOSCOPIC ULTRASOUND (EUS) LINEAR;  Surgeon: Ronald Banister, MD;  Location: [facility];  Service: Endoscopy;  Laterality: N/A;  . KNEE SURGERY     age 57 for Baker's cyst  . THORACOTOMY  1990   wedge biopsy of mediastinal mass    Family History  Problem Relation Age of Onset  . Dementia Mother   . Cancer Maternal Aunt     colon  . Emphysema Maternal Aunt   . Cancer Paternal Grandfather     esophagus    Social History   Social History  . Marital status: Married    Spouse name: N/A  . Number of children: 3  . Years of education: 56   Occupational History  . [employer]   Social History Main Topics  . Smoking status: Never Smoker  . Smokeless tobacco: Never Used  . Alcohol use Yes     Comment: rare- social  . Drug use: No  . Sexual activity: Yes    Partners: Female   Other Topics Concern  . Not on file   Social History Narrative   Ronald Thompson. 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 2 sons: 1979, 1985- married with 2 daughters 1 in route: Oldest in [place], younger [place], [place]. 1 daughter: 17 [place], married-1 granddaughter.Customer service manager- Nurse, mental health. SO-  good health; marriage in good health   Enjoys watching movies, works out at home gym    Current Outpatient Prescriptions  Medication Sig Dispense Refill  . aspirin EC 81 MG tablet Take 1 tablet (81 mg total) by mouth daily. 90 tablet 3  . calcium carbonate (TUMS - DOSED IN MG ELEMENTAL CALCIUM) 500 MG chewable tablet Chew 3 tablets by mouth as needed for indigestion or heartburn.    . docusate sodium (COLACE) 100 MG capsule Take 100 mg by mouth daily as needed for mild constipation.    Marland Kitchen ibuprofen (ADVIL,MOTRIN) 200 MG tablet Take 600 mg by mouth every 6 (six) hours as needed for mild pain or moderate pain.     Marland Kitchen losartan (COZAAR) 50 MG tablet Take 1 tablet (50 mg total) by mouth every morning. 90 tablet 3  . Octreotide Acetate (SANDOSTATIN IJ) Inject 1 each as directed every 30 (thirty) days.     . sildenafil (REVATIO) 20 MG tablet Take 20 mg by mouth as needed (for ED).    Marland Kitchen silodosin (RAPAFLO) 8 MG CAPS capsule Take 8 mg by mouth daily with breakfast. Reported on 02/08/2016    . simvastatin (ZOCOR) 20 MG tablet TAKE 1 TABLET (20 MG TOTAL)  BY MOUTH AT BEDTIME. 30 tablet 5   No current facility-administered medications for this visit.     Allergies  Allergen Reactions  . Penicillins Hives and Other (See Comments)    Entire body.      Review of Systems:   General:  normal appetite, decreased energy, no weight gain, no weight loss, no fever  Cardiac:  no chest pain with exertion, no chest pain at rest, +SOB with exertion, no resting SOB, + PND, no orthopnea, no palpitations, no arrhythmia, no atrial fibrillation, no LE edema, no dizzy spells, no syncope  Respiratory:  + shortness of breath, no home oxygen, no productive cough, no dry cough, no bronchitis, no wheezing, no hemoptysis, no asthma, no pain with inspiration or cough, no sleep apnea, no CPAP at night  GI:   no difficulty swallowing, no reflux, no frequent heartburn, no hiatal hernia, no abdominal pain, no constipation, no  diarrhea, no hematochezia, no hematemesis, no melena  GU:   no dysuria,  no frequency, no urinary tract infection, no hematuria, no enlarged prostate, no kidney stones, no kidney disease  Vascular:  no pain suggestive of claudication, no pain in feet, no leg cramps, no varicose veins, no DVT, no non-healing foot ulcer  Neuro:   no stroke, no TIA's, no seizures, no headaches, no temporary blindness one eye,  no slurred speech, no peripheral neuropathy, no chronic pain, no instability of gait, no memory/cognitive dysfunction  Musculoskeletal: no arthritis, no joint swelling, no myalgias, no difficulty walking, normal mobility   Skin:   no rash, no itching, no skin infections, no pressure sores or ulcerations  Psych:   no anxiety, no depression, no nervousness, no unusual recent stress  Eyes:   no blurry vision, no floaters, no recent vision changes, no wears glasses or contacts  ENT:   no hearing loss, no loose or painful teeth, no dentures, last saw dentist within the past year  Hematologic:  no easy bruising, no abnormal bleeding, no clotting disorder, no frequent epistaxis  Endocrine:  no diabetes, does not check CBG's at home           Physical Exam:   BP 129/80   Pulse 81   Resp 16   Ht 5\' 10"  (1.778 m)   Wt 184 lb (83.5 kg)   SpO2 98% Comment: ON RA  BMI 26.40 kg/m   General:    well-appearing  HEENT:  Unremarkable   Neck:   no JVD, no bruits, no adenopathy   Chest:   clear to auscultation, symmetrical breath sounds, no wheezes, no rhonchi   CV:   RRR, grade IV/VI crescendo/decrescendo murmur heard best at RUSB,  no diastolic murmur  Abdomen:  soft, non-tender, no masses   Extremities:  warm, well-perfused, pulses palpable, no LE edema  Rectal/GU  Deferred  Neuro:   Grossly non-focal and symmetrical throughout  Skin:   Clean and dry, no rashes, no breakdown   Diagnostic Tests:  Transthoracic Echocardiography  Patient:    Dshaun, Deets MR #:       [id number] Study Date:  12/06/2015 Gender:     M Age:        39 Height:     177.8 cm Weight:     83.8 kg BSA:        2.05 m^2 Pt. Status: Room:   ATTENDING    [facility]  REFERRING    Ronald Thompson  SONOGRAPHER  Cindy Hazy, RDCS  PERFORMING   [facility], Outpatient  cc:  ------------------------------------------------------------------- LV EF: 60% -   65%  ------------------------------------------------------------------- Indications:      I35.0 Aortic Stenosis.  ------------------------------------------------------------------- History:   PMH:  Acquired from the patient and from the patient&'s chart.  PMH:  Murmur. Pericarditis.  Risk factors:  Hypertension. Hypercholesterolemia.  ------------------------------------------------------------------- Study Conclusions  - Left ventricle: The cavity size was normal. Wall thickness was   increased in a pattern of mild LVH. Systolic function was normal.   The estimated ejection fraction was in the range of 60% to 65%.   Doppler parameters are consistent with abnormal left ventricular   relaxation (grade 1 diastolic dysfunction). The E/e&' ratio is   >15, suggesting elevated LV filling pressure. - Aortic valve: Heavily calcified aortic valve with severe aortic   stenosis. There was mild regurgitation. Mean gradient (S): 39 mm   Hg. Peak gradient (S): 61 mm Hg. Valve area (VTI): 0.98 cm^2.   Valve area (Vmax): 0.85 cm^2. - Mitral valve: Calcified annulus. There was trivial regurgitation. - Left atrium: The atrium was mildly dilated. - Right ventricle: The cavity size was mildly dilated. - Right atrium: The atrium was mildly dilated. - Tricuspid valve: There was trivial regurgitation. - Pulmonary arteries: PA peak pressure: 41 mm Hg (S). - Inferior vena cava: The vessel was dilated. The respirophasic   diameter changes were blunted (< 50%), consistent with elevated   central venous  pressure.  Impressions:  - Compared to a prior echo in 2011, there is now severe aortic   stenosis - the mean gradient is 39 mmHg - calculated AVA is   0.8-0.9 cm2.  Transthoracic echocardiography.  M-mode, complete 2D, spectral Doppler, and color Doppler.  Birthdate:  Patient birthdate: Aug 27, 1955.  Age:  Patient is 60 yr old.  Sex:  Gender: male. BMI: 26.5 kg/m^2.  Blood pressure:     126/80  Patient status: Outpatient.  Study date:  Study date: 12/06/2015. Study time: 10:43 AM.  Location:  [place] [facility]  -------------------------------------------------------------------  ------------------------------------------------------------------- Left ventricle:  The cavity size was normal. Wall thickness was increased in a pattern of mild LVH. Systolic function was normal. The estimated ejection fraction was in the range of 60% to 65%. Doppler parameters are consistent with abnormal left ventricular relaxation (grade 1 diastolic dysfunction). The E/e&' ratio is >15, suggesting elevated LV filling pressure.  ------------------------------------------------------------------- Aortic valve:  Heavily calcified aortic valve with severe aortic stenosis.  Doppler:  There was mild regurgitation.    VTI ratio of LVOT to aortic valve: 0.31. Valve area (VTI): 0.98 cm^2. Indexed valve area (VTI): 0.48 cm^2/m^2. Peak velocity ratio of LVOT to aortic valve: 0.27. Valve area (Vmax): 0.85 cm^2. Indexed valve area (Vmax): 0.42 cm^2/m^2. Mean velocity ratio of LVOT to aortic valve: 0.29. Valve area (Vmean): 0.91 cm^2. Indexed valve area (Vmean): 0.44 cm^2/m^2.    Mean gradient (S): 39 mm Hg. Peak gradient (S): 61 mm Hg.  ------------------------------------------------------------------- Aorta:  Aortic root: The aortic root was normal in size. Ascending aorta: The ascending aorta was normal in size.  ------------------------------------------------------------------- Mitral valve:    Calcified annulus.  Doppler:  There was trivial regurgitation.    Peak gradient (D): 9 mm Hg.  ------------------------------------------------------------------- Left atrium:  The atrium was mildly dilated.  ------------------------------------------------------------------- Atrial septum:  No defect or patent foramen ovale was identified.   ------------------------------------------------------------------- Right ventricle:  The cavity size was mildly dilated. Systolic function was normal.  ------------------------------------------------------------------- Pulmonic valve:  The valve appears to be grossly normal. Doppler:  There was no significant regurgitation.  ------------------------------------------------------------------- Tricuspid valve:   Doppler:  There was trivial regurgitation.  ------------------------------------------------------------------- Pulmonary artery:   Poorly visualized.  ------------------------------------------------------------------- Right atrium:  The atrium was mildly dilated.  ------------------------------------------------------------------- Pericardium:  There was no pericardial effusion.  ------------------------------------------------------------------- Systemic veins: Inferior vena cava: The vessel was dilated. The respirophasic diameter changes were blunted (< 50%), consistent with elevated central venous pressure.  ------------------------------------------------------------------- Measurements   Left ventricle                            Value          Reference  LV ID, ED, PLAX chordal           (L)     35.7  mm       43 - 52  LV ID, ES, PLAX chordal                   24.6  mm       23 - 38  LV fx shortening, PLAX chordal            31    %        >=29  LV PW thickness, ED                       12.4  mm       ---------  IVS/LV PW ratio, ED                       1.27           <=1.3  Stroke volume, 2D                          81    ml       ---------  Stroke volume/bsa, 2D                     40    ml/m^2   ---------  LV e&', lateral                            6.95  cm/s     ---------  LV E/e&', lateral                          21.15          ---------  LV e&', medial                             5.07  cm/s     ---------  LV E/e&', medial                           28.99          ---------  LV e&', average                            6.01  cm/s     ---------  LV E/e&', average                          24.46          ---------  Ventricular septum                        Value          Reference  IVS thickness, ED                         15.7  mm       ---------    LVOT                                      Value          Reference  LVOT ID, S                                20    mm       ---------  LVOT area                                 3.14  cm^2     ---------  LVOT ID                                   20    mm       ---------  LVOT peak velocity, S                     105   cm/s     ---------  LVOT mean velocity, S                     85.7  cm/s     ---------  LVOT VTI, S                               25.9  cm       ---------  Stroke volume (SV), LVOT DP               81.4  ml       ---------  Stroke index (SV/bsa), LVOT DP            39.7  ml/m^2   ---------    Aortic valve                              Value          Reference  Aortic valve peak velocity, S             390   cm/s     ---------  Aortic valve mean velocity, S             297   cm/s     ---------  Aortic valve VTI, S                       82.8  cm       ---------  Aortic mean gradient, S                   39    mm Hg    ---------  Aortic  peak gradient, S                   61    mm Hg    ---------  VTI ratio, LVOT/AV                        0.31           ---------  Aortic valve area, VTI                    0.98  cm^2     ---------  Aortic valve area/bsa, VTI                0.48  cm^2/m^2 ---------  Velocity ratio, peak, LVOT/AV              0.27           ---------  Aortic valve area, peak velocity          0.85  cm^2     ---------  Aortic valve area/bsa, peak               0.42  cm^2/m^2 ---------  velocity  Velocity ratio, mean, LVOT/AV             0.29           ---------  Aortic valve area, mean velocity          0.91  cm^2     ---------  Aortic valve area/bsa, mean               0.44  cm^2/m^2 ---------  velocity  Aortic regurg pressure half-time          308   ms       ---------    Aorta                                     Value          Reference  Aortic root ID, ED                        34    mm       ---------    Left atrium                               Value          Reference  LA ID, A-P, ES                            39    mm       ---------  LA ID/bsa, A-P                            1.9   cm/m^2   <=2.2  LA volume, S                              72.8  ml       ---------  LA volume/bsa, S                          35.6  ml/m^2   ---------  LA volume, ES, 1-p A4C                    61    ml       ---------  LA volume/bsa, ES, 1-p A4C                29.8  ml/m^2   ---------  LA volume, ES, 1-p A2C                    85.2  ml       ---------  LA volume/bsa, ES, 1-p A2C                41.6  ml/m^2   ---------    Mitral valve                              Value          Reference  Mitral E-wave peak velocity               147   cm/s     ---------  Mitral A-wave peak velocity               157   cm/s     ---------  Mitral deceleration time          (H)     299   ms       150 - 230  Mitral peak gradient, D                   9     mm Hg    ---------  Mitral E/A ratio, peak                    0.9            ---------    Pulmonary arteries                        Value          Reference  PA pressure, S, DP                (H)     41    mm Hg    <=30    Tricuspid valve                           Value          Reference  Tricuspid regurg peak velocity            256   cm/s     ---------  Tricuspid peak RV-RA  gradient             26    mm Hg    ---------    Right ventricle                           Value          Reference  TAPSE                                     27    mm       ---------  RV s&', lateral, S  22.6  cm/s     ---------  Legend: (L)  and  (H)  mark values outside specified reference range.  ------------------------------------------------------------------- Prepared and Electronically Authenticated by  Lyman Bishop MD 2017-05-03T15:58:12   Right/Left Heart Cath and Coronary Angiography  Conclusion   1. Known severe aortic stenosis with heavy calcification of the aortic valve, and 3+ aortic insufficiency by aortography 2. Widely patent coronary arteries (left-dominant) 3. Elevated right-sided intracardiac pressures  Recommendations: cardiac surgical consultation for continued evaluation of treatment options for severe symptomatic aortic stenosis  Indications   Severe aortic stenosis [I35.0 ([id number]  Complications   Complications documented in old activity   INDICATION: Severe aortic stenosis   PROCEDURAL DETAILS:  Initially, right heart catheterization is attempted through the left antecubital vein. There was an indwelling IV and this was changed out for a 5/6 French slender sheath without difficulty. I was not able to access the central venous circulation. A venogram is performed and the left subclavian vein is occluded with collaterals. Attention is then turned to the right groin.  The right groin was prepped, draped, and anesthetized with 1% lidocaine. Using the modified Seldinger technique a 5 French sheath was placed in the right femoral artery and a 7 French sheath was placed in the right femoral vein. A Swan-Ganz catheter was used for the right heart catheterization. Standard protocol was followed for recording of right heart pressures and sampling of oxygen saturations. Fick cardiac output was calculated. Standard Judkins catheters  were used for selective coronary angiography and aortic root angiography. There were no immediate procedural complications. The patient was transferred to the post catheterization recovery area for further monitoring.   During this procedure the patient is administered a total of Versed 2 mg and Fentanyl 25 mg to achieve and maintain moderate conscious sedation. The patient's heart rate, blood pressure, and oxygen saturation are monitored continuously during the procedure. The period of conscious sedation is 54 minutes, of which I was present face-to-face 100% of this time.  Estimated blood loss <50 mL. There were no immediate complications during the procedure.      Coronary Findings   Dominance: Left  Left Anterior Descending  The LAD has no significant obstructive disease. The vessel has mild calcification. It is patent to the LV apex.  Ramus Intermedius  Medium caliber vessel without significant stenosis.  Left Circumflex  The left circumflex is a large, dominant vessel. The vessel is patent throughout without stenosis. The obtuse marginal and left PDA branches are patent.  Right Coronary Artery  Medium caliber, nondominant right coronary artery without stenosis.  Right Heart   Right Heart Pressures Hemodynamic findings consistent with mild pulmonary hypertension. LV EDP is normal. Mild pulmonary HTN with transpulmonic gradient 7 mmHg, PVR 2 Woods Units, suggestive of left heart disease    Left Heart   Aortic Valve There is severe aortic valve stenosis, and moderate (3+) aortic regurgitation. The aortic valve is calcified. There is restricted aortic valve motion. The aortic valve is severely calcified. There is bulky calcifications in the aortic valve leaflets and annulus extending into the inter-valvular fibrosa    Coronary Diagrams   Diagnostic Diagram     Implants     No implant documentation for this case.  PACS Images   Show images for Cardiac catheterization   Link to  Procedure Log   Procedure Log    Hemo Data   Flowsheet Row Most Recent Value  Fick Cardiac Output 3.44 L/min  Fick Cardiac Output Index 1.73 (  L/min)/BSA  RA A Wave 15 mmHg  RA V Wave 12 mmHg  RA Mean 12 mmHg  RV Systolic Pressure 58 mmHg  RV Diastolic Pressure 7 mmHg  RV EDP 14 mmHg  PA Systolic Pressure 46 mmHg  PA Diastolic Pressure 15 mmHg  PA Mean 28 mmHg  PW A Wave 22 mmHg  PW V Wave 24 mmHg  PW Mean 21 mmHg  AO Systolic Pressure [id number] mmHg  AO Diastolic Pressure 75 mmHg  AO Mean 99 mmHg  QP/QS 1  TPVR Index 16.19 HRUI  TSVR Index 57.23 HRUI  PVR SVR Ratio 0.08  TPVR/TSVR Ratio 0.28    Cardiac TAVR CT TECHNIQUE: The patient was scanned on a Philips 256 scanner. A 120 kV retrospective scan was triggered in the descending thoracic aorta at 111 HU's. Gantry rotation speed was 270 msecs and collimation was .9 mm. No beta blockade or nitro were given. The 3D data set was reconstructed in 5% intervals of the R-R cycle. Systolic and diastolic phases were analyzed on a dedicated work station using MPR, MIP and VRT modes. The patient received 80 cc of contrast. FINDINGS: Aortic Valve: Trileaflet with severe extensive calcifications of all of the leaflets with severe subvalvular calcifications extending into the LVOT predominantly under the left coronary leaflet and continuing into the mitral annular calcifications. Aorta: Normal caliber with mild mild diffuse calcifications in the aortic arch and no dissection. Sinotubular Junction:  28 x 27 mm Ascending Thoracic Aorta:  29 x 28 mm Aortic Arch:  26 x 24 mm Descending Thoracic Aorta:  22 x 22 mm Sinus of Valsalva Measurements: Non-coronary:  35 mm Right -coronary:  33 mm Left -coronary:  32 mm Coronary Artery Height above Annulus: Left Main:  16 mm Right Coronary:  18 mm Virtual Basal Annulus Measurements: Maximum/Minimum Diameter:  28 x 24 mm Perimeter:  82 mm Area:  502 mm2 Coronary Arteries: Calcium score of  9 that represents 32 percentile for age/sex. Coronary evaluation is suboptimal however: Normal coronary origin with left dominance. Left main is a large vessel with no stenosis. LAD is a a large and long vessel that wraps around the apex and gives rise to 2 diagonal branches. There is mild calcified plaque in the proximal LAD with associated stenosis 0-25%. LCX is a large dominant vessel that gives rise to two obtuse marginal branches and PDA. There is no significant stenosis. RCA is a small non-dominant artery that is poorly visualized. Optimum Fluoroscopic Angle for Delivery:  LAO 5 CAU 5 Other findings: Dilated pulmonary artery measuring 35 x 27 mm suggestive of pulmonary hypertension. Significantly dilated right atrium and ventricle. Norma pulmonary vein drainage into the left atrium. No thrombus in the left atrial appendage. No ASD/VSD. IMPRESSION: 1. Aortic valve is trileaflet with extensive calcifications of all three leaflets with asymmetric subvalvular calcifications predominantly under the left coronary cusp extending into LVOT and MAC. Because of severe calcifications the annular sizing is challenging, however it appears the the annulus is suitable for delivery of a 26 mm Nadir-SAPIEN TAVR valve. 2.  There is sufficient annulus to coronary distance. 3. There is normal coronary origin with left dominance and only mild non-obstructive CAD in the LAD. 4. Optimum Fluoroscopic Angle for Delivery: LAO 5 CAU 5 5. Dilated right heart chambers and pulmonary artery are consistent with pulmonary hypertension. Interventricular septal flattening during diastole is consistent with RV fluid overload. Ena Dawley Electronically Signed   By: Ena Dawley   On: 01/18/2016 18:36  CT ANGIOGRAPHY CHEST, ABDOMEN AND PELVIS  TECHNIQUE: Multidetector CT imaging through the chest, abdomen and pelvis was performed using the standard protocol during bolus administration  of intravenous contrast. Multiplanar reconstructed images and MIPs were obtained and reviewed to evaluate the vascular anatomy.  CONTRAST:  70 mL of Isovue 370.  COMPARISON:  CT the abdomen and pelvis 08/30/2015. Chest CT 02/04/2014.  FINDINGS: CTA CHEST FINDINGS  Mediastinum/Lymph Nodes: Heart size is enlarged with right atrial and right ventricular dilatation. There is some bowing of the interventricular septum from right to left, which could suggest elevated right-sided heart pressures. Pericardial thickening calcification is noted, most evident overlying the right ventricular outflow tract, pulmonic valve and proximal pulmonic trunk. No other pericardial calcifications or additional areas of pericardial thickening are noted. There is atherosclerosis of the thoracic aorta, the great vessels of the mediastinum and the coronary arteries, including calcified atherosclerotic plaque in the left anterior descending coronary artery. Severe calcifications of the aortic valve. In addition, there are severe calcifications of the mitral-aortic intervalvular fibrosa, mitral annulus and anterior leaflet of the mitral valve. No pathologically enlarged mediastinal or hilar lymph nodes. Left axillary vein appears diminutive. No left subclavian or left innominate vein of significant caliber is identified on today's examination. Innumerable small collateral veins are noted in the left axillary region, lower cervical regions bilaterally, and involving the azygos/hemiazygous system. Esophagus is unremarkable in appearance. No axillary lymphadenopathy.  Lungs/Pleura: 6 mm subpleural nodule in the periphery of the lateral segment of the right middle lobe (image 47 of series 507) is similar to prior examination 08/30/2015, likely a subpleural lymph node. 2 mm left lower lobe pulmonary nodule (image 34 of series 507), unchanged dating back to 02/04/2014, considered benign. No other larger more  suspicious appearing pulmonary nodules or masses are noted. Extensive septal thickening and architectural distortion in the left upper lobe, similar to prior study 02/04/2014, presumably either post infectious/inflammatory scarring, or related to remote radiation therapy. No acute consolidative airspace disease. No pleural effusions. Chronic elevation of left hemidiaphragm is unchanged.  Musculoskeletal/Soft Tissues: There are no aggressive appearing lytic or blastic lesions noted in the visualized portions of the skeleton.  CTA ABDOMEN AND PELVIS FINDINGS  Hepatobiliary: Again noted are numerous heterogeneously enhancing lesions scattered throughout the liver, compatible with widespread metastatic disease. The largest of these is in the periphery of segment 7 of the liver (image 289 of series 502) which currently measures 5.2 x 4.1 cm (previously 4.3 x 3.4 cm on prior 08/30/2015. Several other lesions have clearly progressed compared the prior study, including a very ill-defined lesion between segments 5 and 6 on image 367 of series 502 which currently measures 4.8 x 3.4 cm (previously 4.1 x 2.7 cm on 09/01/2015). Additional example of progression is a 3.4 x 2.4 cm lesion in segment 4B adjacent to the falciform ligament (image 350 of series 502) which previously measured only 2.9 x 2.1 cm. No intra or extrahepatic biliary ductal dilatation. Partially calcified gallstone in the fundus of the gallbladder measuring 3.0 cm in diameter. Gallbladder is partially contracted. No pericholecystic fluid or surrounding inflammatory changes.  Pancreas: The primary lesion in the head of the pancreas appears larger than the prior study, currently measuring 2.4 x 1.7 cm (image 192 of series 501), previously 1.4 cm on 08/30/2015. No pancreatic ductal dilatation. No pancreatic or peripancreatic fluid or inflammatory changes.  Spleen: Unremarkable.  Adrenals/Urinary Tract: Several sub cm  low-attenuation lesions in the kidneys bilaterally too small to definitively characterize, but  are statistically likely to represent cysts. 3.9 cm simple cyst in the medial aspect of the interpolar region of the right kidney. No hydroureteronephrosis. Bilateral adrenal glands are normal in appearance. Urinary bladder is normal in appearance.  Stomach/Bowel: The appearance of the stomach is normal. There is no pathologic dilatation of small bowel or colon. The appendix is not confidently identified and may be surgically absent. Regardless, there are no inflammatory changes noted adjacent to the cecum to suggest the presence of an acute appendicitis at this time.  Vascular/Lymphatic: Atherosclerosis throughout the abdominal and pelvic vasculature, without evidence of aneurysm or dissection. Vascular findings and measurements pertinent to potential TAVR procedure, as detailed below. Dilated intrahepatic portion of the IVC which measures up to 3.4 cm in diameter. No lymphadenopathy noted in the abdomen or pelvis, although portacaval lymph nodes are borderline enlarged measuring up to 11 mm in short axis.  Reproductive: Prostate gland and seminal vesicles are unremarkable in appearance. Bilateral hydroceles are incompletely visualized in the upper scrotum.  Other: No significant volume of ascites.  No pneumoperitoneum.  Musculoskeletal: 13 mm sclerotic lesion in the left ilium just lateral to the left sacroiliac joint, similar to prior studies dating back to at least 02/04/2014, presumably benign. There are no aggressive appearing lytic or blastic lesions noted in the visualized portions of the skeleton.  VASCULAR MEASUREMENTS PERTINENT TO TAVR:  AORTA:  Minimal Aortic Diameter -  15 x 18 mm  Severity of Aortic Calcification -  mild  RIGHT PELVIS:  Right Common Iliac Artery -  Minimal Diameter - 9.0 x 10.5 mm  Tortuosity - moderate  Calcification -  mild  Right External Iliac Artery -  Minimal Diameter - 8.3 x 10.3 mm  Tortuosity - severe  Calcification - mild  Right Common Femoral Artery -  Minimal Diameter - 9.9 x 9.6 mm  Tortuosity - mild  Calcification - none  LEFT PELVIS:  Left Common Iliac Artery -  Minimal Diameter - 11.2 x 9.8 mm  Tortuosity - moderate  Calcification - mild  Left External Iliac Artery -  Minimal Diameter - 9.9 x 8.1 mm  Tortuosity - severe  Calcification - mild  Left Common Femoral Artery -  Minimal Diameter - 9.7 x 7.8 mm  Tortuosity - mild  Calcification - mild  Review of the MIP images confirms the above findings.  IMPRESSION: 1. Vascular findings and measurements pertinent to potential TAVR procedure, as detailed above. This patient does appear to have spot suitable pelvic arterial access bilaterally, however, the external iliac arteries are extremely tortuous bilaterally. The degree of tortuosity appears less severe on the left side. Unfortunately, there is only minimal calcified atherosclerotic plaque throughout the pelvic arterial access vessels. 2. Severe thickening and calcification of the aortic valve, compatible with the reported clinical history of severe aortic stenosis. There is also very severe calcification of the mitral-aortic intervalvular fibrosa, anterior leaflet of the mitral valve and superior aspect of the mitral annulus, which have implications for potential TAVR procedure. 3. Widespread metastatic disease to the liver again noted, as above. This has clearly progressed compared to the prior study from 08/30/2015. 4. 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 5. Right atrial and right ventricular dilatation, reflux of contrast into the IVC (which appears dilated), and bowing of the interventricular septum from  right to left, concerning for elevated right-sided heart pressures. 6. Pericardial calcification most  evident anteriorly overlying the right ventricular outflow tract, pulmonic valve and proximal pulmonic trunk, potentially related to prior mediastinal radiation therapy. 7. Fibrotic changes in the left upper lobe, similar to prior study, also potentially related to prior radiation therapy, or potentially post infectious scarring. 8. Additional incidental findings, as above.   Electronically Signed   By: Vinnie Langton M.D.   On: 01/17/2016 12:56       Impression:  Patient has stage D severe symptomatic aortic stenosis. He presents with gradual progression of symptoms of exertional shortness of breath and fatigue consistent with chronic diastolic congestive heart failure, [place] Heart Association functional class II. I have personally reviewed the patient's recent transthoracic echocardiogram, cardiac catheterization, and CT angiograms. Echocardiogram reveals extremely severe calcification and thickening of all of the leaflets of the aortic valve. The valve may be functioning bicuspid but it is difficult to tell because of the severity of calcification. Peak velocity across the aortic valve ranges between 3.7 and 4.1 m/s on this most recent echocardiogram, corresponding to mean transvalvular gradient estimated greater than 40 mmHg. Left ventricular systolic function remains preserved. There is moderate aortic insufficiency. Diagnostic cardiac catheterization is notable for the absence of significant coronary artery disease. Risks associated with conventional surgical aortic valve replacement would be at least moderately elevated due to the extensive calcification in the aortic root and changes related to severe radiation therapy in the past, including known pulmonary fibrosis. Cardiac gated CT angiogram of the heart reveals findings consistent with severe aortic stenosis and anatomical  characteristics suitable for transcatheter aortic valve replacement. There is extensive calcification in the aortic root which might place the patient at slightly increased risk for the development of perivalvular leak following TAVR.  CT image gram of the aorta and iliac vessels demonstrate adequate vascular access to facilitate a percutaneous transfemoral approach.   Plan:  The patient was counseled at length regarding treatment alternatives for management of severe symptomatic aortic stenosis. Alternative approaches such as conventional aortic valve replacement, transcatheter aortic valve replacement, and palliative medical therapy were compared and contrasted at length.  The natural history of severe aortic stenosis was discussed at length and put into contact with regards to the patient's known malignancy. The risks associated with conventional surgical aortic valve replacement were been discussed in detail, as were expectations for post-operative convalescence. Long-term prognosis with medical therapy was discussed. This discussion was placed in the context of the patient's own specific clinical presentation and past medical history.  All of his questions been addressed.  The patient is interested in proceeding with transcatheter aortic valve replacement as an alternative to conventional surgery associated with at least moderate risk.  We plan to proceed with surgery on Tuesday, 04/16/2016.  Following the decision to proceed with transcatheter aortic valve replacement, a discussion has been held regarding what types of management strategies would be attempted intraoperatively in the event of life-threatening complications, including whether or not the patient would be considered a candidate for the use of cardiopulmonary bypass and/or conversion to open sternotomy for attempted surgical intervention.  The patient has been advised of a variety of complications that might develop including but not limited  to risks of death, stroke, paravalvular leak, aortic dissection or other major vascular complications, aortic annulus rupture, device embolization, cardiac rupture or perforation, mitral regurgitation, acute myocardial infarction, arrhythmia, heart block or bradycardia requiring permanent pacemaker placement, congestive heart failure, respiratory failure, renal failure, pneumonia, infection, other late complications related to structural valve deterioration or migration,  or other complications that might ultimately cause a temporary or permanent loss of functional independence or other long term morbidity.  The patient provides full informed consent for the procedure as described and all questions were answered.      Valentina Gu. Roxy Manns, MD 04/01/2016 10:17 AM

## 2016-04-16 NOTE — Transfer of Care (Signed)
Immediate Anesthesia Transfer of Care Note  Patient: Ronald Thompson  Procedure(s) Performed: Procedure(s): TRANSCATHETER AORTIC VALVE REPLACEMENT, TRANSFEMORAL (N/A) TRANSESOPHAGEAL ECHOCARDIOGRAM (TEE) (N/A)  Patient Location: ICU  Anesthesia Type:General  Level of Consciousness: awake, alert  and oriented  Airway & Oxygen Therapy: Patient Spontanous Breathing and Patient connected to face mask oxygen  Post-op Assessment: Report given to RN, Post -op Vital signs reviewed and stable and Patient moving all extremities X 4  Post vital signs: Reviewed and stable  Last Vitals:  Vitals:   04/16/16 0609  BP: (!) 162/64  Pulse: 71  Resp: 20  Temp: 36.6 C    Last Pain:  Vitals:   04/16/16 0609  TempSrc: Oral         Complications: No apparent anesthesia complications

## 2016-04-16 NOTE — Anesthesia Preprocedure Evaluation (Addendum)
Anesthesia Evaluation  Patient identified by MRN, date of birth, ID band Patient awake    Reviewed: Allergy & Precautions, NPO status , Patient's Chart, lab work & pertinent test results  Airway Mallampati: I  TM Distance: >3 FB Neck ROM: Full    Dental   Pulmonary shortness of breath,    Pulmonary exam normal        Cardiovascular hypertension, Pt. on medications Normal cardiovascular exam+ Valvular Problems/Murmurs AS   ECHO 04/2016 Study Conclusions  - Left ventricle: The cavity size was normal. Wall thickness was increased in a pattern of mild LVH. Systolic function was normal. The estimated ejection fraction was in the range of 60% to 65%. Doppler parameters are consistent with abnormal left ventricular relaxation (grade 1 diastolic dysfunction). The E/e&' ratio is >15, suggesting elevated LV filling pressure. - Aortic valve: Heavily calcified aortic valve with severe aortic stenosis. There was mild regurgitation. Mean gradient (S): 39 mm Hg. Peak gradient (S): 61 mm Hg. Valve area (VTI): 0.98 cm^2. Valve area (Vmax): 0.85 cm^2. - Mitral valve: Calcified annulus. There was trivial regurgitation. - Left atrium: The atrium was mildly dilated. - Right ventricle: The cavity size was mildly dilated. - Right atrium: The atrium was mildly dilated. - Tricuspid valve: There was trivial regurgitation. - Pulmonary arteries: PA peak pressure: 41 mm Hg (S). - Inferior vena cava: The vessel was dilated. The respirophasic diameter changes were blunted (<50%), consistent with elevated central venous pressure.  Impressions:  - Compared to a prior echo in 2011, there is now severe aortic stenosis - the mean gradient is 39 mmHg - calculated AVA is 0.8-0.9 cm2.   Neuro/Psych    GI/Hepatic   Endo/Other    Renal/GU      Musculoskeletal   Abdominal   Peds  Hematology   Anesthesia Other  Findings   Reproductive/Obstetrics                            Anesthesia Physical Anesthesia Plan  ASA: IV  Anesthesia Plan: General   Post-op Pain Management:    Induction: Intravenous  Airway Management Planned: Oral ETT  Additional Equipment: Arterial line, TEE, CVP, PA Cath and Ultrasound Guidance Line Placement  Intra-op Plan:   Post-operative Plan: Post-operative intubation/ventilation  Informed Consent: I have reviewed the patients History and Physical, chart, labs and discussed the procedure including the risks, benefits and alternatives for the proposed anesthesia with the patient or authorized representative who has indicated his/her understanding and acceptance.     Plan Discussed with: CRNA and Surgeon  Anesthesia Plan Comments:         Anesthesia Quick Evaluation

## 2016-04-16 NOTE — Op Note (Signed)
[facility]   MULTIDISCIPLINARY HEART VALVE TEAM   TAVR OPERATIVE NOTE   Date of Procedure:  04/16/2016  Preoperative Diagnosis: Severe Aortic Stenosis   Postoperative Diagnosis: Same   Procedure:    Transcatheter Aortic Valve Replacement - Percutaneous Transfemoral Approach  Edwards Sapien 3 THV (size 26 mm, model # 9600TFX, serial # [id number])   Co-Surgeons:  Valentina Gu. Roxy Manns, MD and [name], MD  Anesthesiologist:  Lillia Abed, MD  Echocardiographer:  Ena Dawley, MD  Pre-operative Echo Findings:  Severe aortic stenosis  Normal left ventricular systolic function  Mild MR  Post-operative Echo Findings:  Trace paravalvular leak  Normal left ventricular systolic function  BRIEF CLINICAL NOTE AND INDICATIONS FOR SURGERY Please see full operative note of Dr Roxy Manns for details of full indication for surgery.  During the course of the patient's preoperative work up they have been evaluated comprehensively by a multidisciplinary team of specialists coordinated through the [facility] in the [facility].  They have been demonstrated to suffer from symptomatic severe aortic stenosis as noted above. The patient has been counseled extensively as to the relative risks and benefits of all options for the treatment of severe aortic stenosis including long term medical therapy, conventional surgery for aortic valve replacement, and transcatheter aortic valve replacement.  The patient has been independently evaluated by two cardiac surgeons including Dr. Roxy Manns and Dr. Cyndia Bent, and they are felt to be at high risk for conventional surgical aortic valve replacement.  Based upon review of all of the patient's preoperative diagnostic tests they are felt to be candidate for transcatheter aortic valve replacement using the transfemoral approach as an alternative to high risk conventional surgery.    Following the decision  to proceed with transcatheter aortic valve replacement, a discussion has been held regarding what types of management strategies would be attempted intraoperatively in the event of life-threatening complications, including whether or not the patient would be considered a candidate for the use of cardiopulmonary bypass and/or conversion to open sternotomy for attempted surgical intervention.  The patient has been advised of a variety of complications that might develop peculiar to this approach including but not limited to risks of death, stroke, paravalvular leak, aortic dissection or other major vascular complications, aortic annulus rupture, device embolization, cardiac rupture or perforation, acute myocardial infarction, arrhythmia, heart block or bradycardia requiring permanent pacemaker placement, congestive heart failure, respiratory failure, renal failure, pneumonia, infection, other late complications related to structural valve deterioration or migration, or other complications that might ultimately cause a temporary or permanent loss of functional independence or other long term morbidity.  The patient provides full informed consent for the procedure as described and all questions were answered preoperatively.    DETAILS OF THE OPERATIVE PROCEDURE  PREPARATION:    The patient is brought to the operating room on the above mentioned date and central monitoring was established by the anesthesia team including placement of Swan-Ganz catheter and radial arterial line. The patient is placed in the supine position on the operating table.  Intravenous antibiotics are administered. General endotracheal anesthesia is induced uneventfully.  A Foley catheter is placed.  Baseline transesophageal echocardiogram was performed. The patient's chest, abdomen, both groins, and both lower extremities are prepared and draped in a sterile manner. A time out procedure is performed.   PERIPHERAL ACCESS:    Using  the modified Seldinger technique, femoral arterial and venous access was obtained with placement of 6 Fr sheaths  on the left side.  A pigtail diagnostic catheter was passed through the left femoral arterial sheath under fluoroscopic guidance into the aortic root.  A temporary transvenous pacemaker catheter was passed through the left femoral venous sheath under fluoroscopic guidance into the right ventricle.  The pacemaker was tested to ensure stable lead placement and pacemaker capture. Aortic root angiography was performed in order to determine the optimal angiographic angle for valve deployment.   TRANSFEMORAL ACCESS:  A micropuncture technique is used to access the right femoral artery under fluoroscopic guidance.  Femoral angiography is performed to verify access in the common femoral artery. 2 Perclose devices are deployed at 10' and 2' positions to 'PreClose' the femoral artery. An 8 French sheath is placed and then an Amplatz Superstiff wire is advanced through the sheath. This is changed out for a 14 French transfemoral E-Sheath after progressively dilating over the Superstiff wire.    Please see note of Dr Roxy Manns for full details of transcatheter heart valve deployment.  PROCEDURE COMPLETION:  The sheath was removed and femoral artery closure is performed using the 2 previously deployed Perclose devices.  Complete hemostasis is obtained. Protamine was administered once femoral arterial repair was complete. The temporary pacemaker, pigtail catheters and femoral sheaths were removed with manual pressure used for hemostasis.   The patient tolerated the procedure well and is transported to the surgical intensive care in stable condition. There were no immediate intraoperative complications. All sponge instrument and needle counts are verified correct at completion of the operation.   [name], MD 04/16/2016 1:16 PM

## 2016-04-16 NOTE — Progress Notes (Signed)
CT surgery p.m. Rounds  Patient resting comfortably Has been out of bed to the chair, Foley catheter removed and has voided Bilateral groin access sites without hematoma or edema Vital signs stable

## 2016-04-17 ENCOUNTER — Encounter (HOSPITAL_COMMUNITY): Payer: Self-pay | Admitting: Cardiovascular Disease

## 2016-04-17 ENCOUNTER — Inpatient Hospital Stay (HOSPITAL_COMMUNITY): Payer: BLUE CROSS/BLUE SHIELD

## 2016-04-17 DIAGNOSIS — Z954 Presence of other heart-valve replacement: Secondary | ICD-10-CM

## 2016-04-17 DIAGNOSIS — I10 Essential (primary) hypertension: Secondary | ICD-10-CM

## 2016-04-17 DIAGNOSIS — I350 Nonrheumatic aortic (valve) stenosis: Secondary | ICD-10-CM

## 2016-04-17 LAB — BASIC METABOLIC PANEL
Anion gap: 6 (ref 5–15)
BUN: 10 mg/dL (ref 6–20)
CHLORIDE: 101 mmol/L (ref 101–111)
CO2: 32 mmol/L (ref 22–32)
CREATININE: 0.95 mg/dL (ref 0.61–1.24)
Calcium: 8.7 mg/dL — ABNORMAL LOW (ref 8.9–10.3)
GFR calc non Af Amer: >60 mL/min (ref >60)
Glucose, Bld: 111 mg/dL — ABNORMAL HIGH (ref 65–99)
POTASSIUM: 4.3 mmol/L (ref 3.5–5.1)
SODIUM: 139 mmol/L (ref 135–145)

## 2016-04-17 LAB — CBC
HCT: 41.8 % (ref 39.0–52.0)
HEMOGLOBIN: 14 g/dL (ref 13.0–17.0)
MCH: 31.7 pg (ref 26.0–34.0)
MCHC: 33.5 g/dL (ref 30.0–36.0)
MCV: 94.8 fL (ref 78.0–100.0)
Platelets: 122 10*3/uL — ABNORMAL LOW (ref 150–400)
RBC: 4.41 MIL/uL (ref 4.22–5.81)
RDW: 12.3 % (ref 11.5–15.5)
WBC: 8.2 10*3/uL (ref 4.0–10.5)

## 2016-04-17 LAB — MAGNESIUM: MAGNESIUM: 1.9 mg/dL (ref 1.7–2.4)

## 2016-04-17 MED ORDER — ACETAMINOPHEN 500 MG PO TABS: 1000.0000 mg | ORAL_TABLET | Freq: Four times a day (QID) | ORAL | Status: DC | PRN

## 2016-04-17 MED ORDER — DOCUSATE SODIUM 100 MG PO CAPS: 100.0000 mg | ORAL_CAPSULE | Freq: Every day | ORAL | Status: DC | PRN

## 2016-04-17 MED ORDER — PNEUMOCOCCAL VAC POLYVALENT 25 MCG/0.5ML IJ INJ: 0.5000 mL | INJECTION | INTRAMUSCULAR | Status: DC

## 2016-04-17 MED ORDER — SODIUM CHLORIDE 0.9% FLUSH: 3.0000 mL | INTRAVENOUS | Status: DC | PRN

## 2016-04-17 MED ORDER — LOSARTAN POTASSIUM 50 MG PO TABS
50.0000 mg | ORAL_TABLET | Freq: Every morning | ORAL | Status: DC
  Administered 2016-04-17 – 2016-04-18 (×2): 50 mg via ORAL
  Filled 2016-04-17 (×3): qty 1

## 2016-04-17 MED ORDER — SODIUM CHLORIDE 0.9% FLUSH
3.0000 mL | Freq: Two times a day (BID) | INTRAVENOUS | Status: DC
  Administered 2016-04-17 – 2016-04-18 (×2): 3 mL via INTRAVENOUS

## 2016-04-17 MED ORDER — SODIUM CHLORIDE 0.9 % IV SOLN: 250.0000 mL | INTRAVENOUS | Status: DC | PRN

## 2016-04-17 NOTE — Progress Notes (Signed)
CARDIAC REHAB PHASE I   PRE:  Rate/Rhythm: 86 SR  BP:  Supine:   Sitting: 139/74  Standing:    SaO2: 95%RA  MODE:  Ambulation: 550 ft   POST:  Rate/Rhythm: 81  BP:  Supine:   Sitting: 146/65  Standing:    SaO2: 90%RA 1350-1424 Pt walked 550 ft on RA with hand held asst. Gait steady. Tolerated well. No complaints. Briefly discussed CRP 2 so pt can consider program. Will folllowup tomorrow.  Graylon Good, RN BSN  04/17/2016 2:20 PM

## 2016-04-17 NOTE — Progress Notes (Signed)
Patient Name: Ronald Thompson Date of Encounter: 04/17/2016  Principal Problem:   S/P TAVR (transcatheter aortic valve replacement)   Length of Stay: 1  SUBJECTIVE  He ffels well, no chest pain or SOB, no pain the the groin.   CURRENT MEDS . aspirin EC  81 mg Oral Daily  . clopidogrel  75 mg Oral Q breakfast  . losartan  50 mg Oral q morning - 10a  . metoprolol tartrate  12.5 mg Oral BID  . [START ON 04/18/2016] pantoprazole  40 mg Oral Daily  . simvastatin  20 mg Oral q1800  . sodium chloride flush  3 mL Intravenous Q12H    OBJECTIVE  Vitals:   04/17/16 0740 04/17/16 0800 04/17/16 0900 04/17/16 1146  BP:  120/80 129/74   Pulse:  79 76   Resp:  (!) 21 20   Temp: 98.5 F (36.9 C)   98.5 F (36.9 C)  TempSrc: Oral   Oral  SpO2:  92% 93%   Weight:      Height:        Intake/Output Summary (Last 24 hours) at 04/17/16 1202 Last data filed at 04/17/16 0700  Gross per 24 hour  Intake          1540.86 ml  Output             1425 ml  Net           115.86 ml   Filed Weights   04/16/16 0609 04/17/16 0500  Weight: 185 lb 14 oz (84.3 kg) 186 lb 8.2 oz (84.6 kg)    PHYSICAL EXAM  General: Pleasant, NAD. Neuro: Alert and oriented X 3. Moves all extremities spontaneously. Psych: Normal affect. HEENT:  Normal  Neck: Supple without bruits or JVD. Lungs:  Resp regular and unlabored, CTA. Heart: RRR no s3, s4, 3/6 systolic murmur, mild diastolic murmur. Abdomen: Soft, non-tender, non-distended, BS + x 4.  Extremities: No clubbing, cyanosis or edema. DP/PT/Radials 2+ and equal bilaterally.  Accessory Clinical Findings  CBC  Recent Labs  04/16/16 1057 04/17/16 0405  WBC 6.4 8.2  HGB 13.6 14.0  HCT 40.3 41.8  MCV 92.6 94.8  PLT 122* 123XX123*   Basic Metabolic Panel  Recent Labs  04/16/16 0934 04/16/16 1051 04/17/16 0405  NA 135 139 139  K 5.0 4.2 4.3  CL 99*  --  101  CO2  --   --  32  GLUCOSE 186* 127* 111*  BUN 20  --  10  CREATININE 0.70  --  0.95    CALCIUM  --   --  8.7*  MG  --   --  1.9   Dg Chest Port 1 View  Result Date: 04/17/2016 CLINICAL DATA:  Status post transcatheter aortic valve replacement. EXAM: PORTABLE CHEST 1 VIEW COMPARISON:  Portable chest x-ray of April 16, 2016 FINDINGS: The right lung is mildly hyperinflated. There is stable elevation of the left hemidiaphragm with resultant volume loss. The interstitial markings on the left are slightly less conspicuous today. The cardiac silhouette is enlarged. The retrocardiac region remains dense. There is calcification within the wall of the aortic arch with mild prominence of the arch noted. The Swan-Ganz catheter is been removed. The right internal jugular Cordis sheath is stable. IMPRESSION: 1. Stable cardiomegaly and mild central pulmonary vascular congestion. Slight decrease in interstitial edema. Stable subsegmental atelectasis on the left associated with volume loss due to the elevated hemidiaphragm. 2. Prominence of the aortic arch with mural  calcification, stable.   TELE: SR    ASSESSMENT AND PLAN  1. S/P TAVR with a 26 mm Tremon SAPIEN valve, trivial residual paravalvular leak, echo post procedure is pending. He feels and looks great. BP stable, no signs of heart block on telemetry. Continue ASA and Plavix.  We can remove central line, mobilize and plan for a discharge tomorrow.  2. Essential hypertension - well controlled.   Signed, Ena Dawley MD, [facility] 04/17/2016

## 2016-04-17 NOTE — Progress Notes (Signed)
[address]       [place] [address]             [phone]        CARDIOTHORACIC SURGERY PROGRESS NOTE   R1 Day Post-Op Procedure(s) (LRB): TRANSCATHETER AORTIC VALVE REPLACEMENT, TRANSFEMORAL (N/A) TRANSESOPHAGEAL ECHOCARDIOGRAM (TEE) (N/A)  Subjective: Looks and feels well.  No pain. No SOB  Objective: Vital signs: BP Readings from Last 1 Encounters:  04/17/16 129/74   Pulse Readings from Last 1 Encounters:  04/17/16 76   Resp Readings from Last 1 Encounters:  04/17/16 20   Temp Readings from Last 1 Encounters:  04/17/16 98.5 F (36.9 C) (Oral)    Hemodynamics: PAP: (48)/(13) 48/13  Physical Exam:  Rhythm:   sinus  Breath sounds: clear  Heart sounds:  RRR w/ faint diastolic murmur LSB  Incisions:  Both groins intact  Abdomen:  Soft, non-distended, non-tender  Extremities:  Warm, well-perfused, pulses intact   Intake/Output from previous day: 09/12 0701 - 09/13 0700 In: 2958.9 [P.O.:250; I.V.:2508.9; IV Piggyback:200] Out: [id number] [Urine:1785; Blood:50] Intake/Output this shift: No intake/output data recorded.  Lab Results:  CBC: Recent Labs  04/16/16 1057 04/17/16 0405  WBC 6.4 8.2  HGB 13.6 14.0  HCT 40.3 41.8  PLT 122* 122*    BMET:  Recent Labs  04/16/16 0934 04/16/16 1051 04/17/16 0405  NA 135 139 139  K 5.0 4.2 4.3  CL 99*  --  101  CO2  --   --  32  GLUCOSE 186* 127* 111*  BUN 20  --  10  CREATININE 0.70  --  0.95  CALCIUM  --   --  8.7*     PT/INR:   Recent Labs  04/16/16 1057  LABPROT 16.3*  INR 1.30    CBG (last 3)  No results for input(s): GLUCAP in the last 72 hours.  ABG    Component Value Date/Time   PHART 7.345 (L) 04/16/2016 1055   PCO2ART 54.9 (H) 04/16/2016 1055   PO2ART 86.0 04/16/2016 1055   HCO3 30.0 (H) 04/16/2016 1055   TCO2 32 04/16/2016 1055   O2SAT 96.0 04/16/2016 1055    CXR: PORTABLE CHEST 1 VIEW  COMPARISON:  Portable chest x-ray of April 16, 2016  FINDINGS: The right lung is mildly hyperinflated. There is stable elevation of the left hemidiaphragm with resultant volume loss. The interstitial markings on the left are slightly less conspicuous today. The cardiac silhouette is enlarged. The retrocardiac region remains dense. There is calcification within the wall of the aortic arch with mild prominence of the arch noted. The Swan-Ganz catheter is been removed. The right internal jugular Cordis sheath is stable.  IMPRESSION: 1. Stable cardiomegaly and mild central pulmonary vascular congestion. Slight decrease in interstitial edema. Stable subsegmental atelectasis on the left associated with volume loss due to the elevated hemidiaphragm.  2. Prominence of the aortic arch with mural calcification, stable.   Electronically Signed   By: David  Martinique M.D.   On: 04/17/2016 07:34   EKG: NSR w/out acute ischemic changes, no AV block    Assessment/Plan: S/P Procedure(s) (LRB): TRANSCATHETER AORTIC VALVE REPLACEMENT, TRANSFEMORAL (N/A) TRANSESOPHAGEAL ECHOCARDIOGRAM (TEE) (N/A)  Doing well POD1 TAVR Mobilize Resume pre-op meds plus low dose beta blocker ASA + Plavix Routine POD1 ECHO Transfer 2W Anticipate likely d/c home tomorrow  I spent in excess of 15 minutes during the conduct of this hospital encounter and >50% of this time involved direct face-to-face  encounter with the patient for counseling and/or coordination of their care.   Rexene Alberts, MD 04/17/2016 9:49 AM

## 2016-04-17 NOTE — Progress Notes (Signed)
TAVR team note:  Pt doing well. Sitting up and has walked around ICU this am. Rhythm stable on no drips. Tx orders written by Dr Roxy Manns. See Dr Francesca Oman complete rounding note for details. Will call echo lab to make sure his post-op study is done today. No significant AI on aortography or intra-op TEE yesterday but he does have a diastolic murmur on exam. Clinically looks great. Anticipate DC tomorrow am.  [name] 04/17/2016 11:11 AM

## 2016-04-17 NOTE — Progress Notes (Signed)
Pt transferred to 2W38 with belongings. Report given to receiving RN and all questions answered. VSS during transfer.  Pt assisted to chair in new room. Family at bedside.

## 2016-04-17 NOTE — Plan of Care (Signed)
Problem: Cardiac: Goal: Ability to maintain an adequate cardiac output will improve Outcome: Completed/Met Date Met: 04/17/16 Adequate before swan ganz pulled Goal: Will show no signs and symptoms of excessive bleeding Outcome: Progressing Incisions clean, dry, and intact.  Problem: Nutritional: Goal: Risk for body nutrition deficit will decrease Outcome: Progressing Tolerating diet  Problem: Respiratory: Goal: Ability to tolerate decreased levels of ventilator support will improve Outcome: Completed/Met Date Met: 04/17/16 Extubated 04/16/2016

## 2016-04-18 ENCOUNTER — Ambulatory Visit: Payer: BLUE CROSS/BLUE SHIELD | Admitting: Cardiovascular Disease

## 2016-04-18 ENCOUNTER — Other Ambulatory Visit (HOSPITAL_COMMUNITY): Payer: BLUE CROSS/BLUE SHIELD

## 2016-04-18 ENCOUNTER — Inpatient Hospital Stay (HOSPITAL_COMMUNITY): Payer: BLUE CROSS/BLUE SHIELD

## 2016-04-18 DIAGNOSIS — Z952 Presence of prosthetic heart valve: Secondary | ICD-10-CM

## 2016-04-18 LAB — ECHOCARDIOGRAM COMPLETE
Height: 70 in
WEIGHTICAEL: 2940.8 [oz_av]

## 2016-04-18 MED ORDER — CLOPIDOGREL BISULFATE 75 MG PO TABS: 75.0000 mg | ORAL_TABLET | Freq: Every day | ORAL | 1 refills | Status: DC

## 2016-04-18 MED ORDER — METOPROLOL TARTRATE 25 MG PO TABS: 12.5000 mg | ORAL_TABLET | Freq: Two times a day (BID) | ORAL | 1 refills | Status: DC

## 2016-04-18 MED ORDER — TRAMADOL HCL 50 MG PO TABS: 50.0000 mg | ORAL_TABLET | ORAL | 0 refills | Status: DC | PRN

## 2016-04-18 MED FILL — Insulin Regular (Human) Inj 100 Unit/ML: INTRAMUSCULAR | Qty: 250 | Status: AC

## 2016-04-18 NOTE — Progress Notes (Addendum)
      [address]       [place] [address]             [phone]        2 Days Post-Op Procedure(s) (LRB): TRANSCATHETER AORTIC VALVE REPLACEMENT, TRANSFEMORAL (N/A) TRANSESOPHAGEAL ECHOCARDIOGRAM (TEE) (N/A)  Subjective: Patient eating breakfast. No complaints. Looking forward to going home.  Objective: Vital signs in last 24 hours: Temp:  [98.1 F (36.7 C)-98.5 F (36.9 C)] 98.1 F (36.7 C) (09/14 0416) Pulse Rate:  [76-89] 79 (09/14 0416) Cardiac Rhythm: Normal sinus rhythm (09/13 1944) Resp:  [20-33] 20 (09/14 0416) BP: (120-146)/(72-82) 146/82 (09/14 0416) SpO2:  [92 %-98 %] 95 % (09/14 0416) Weight:  [183 lb 12.8 oz (83.4 kg)] 183 lb 12.8 oz (83.4 kg) (09/14 0416)   Current Weight  04/18/16 183 lb 12.8 oz (83.4 kg)       Intake/Output from previous day: 09/13 0701 - 09/14 0700 In: 0  Out: 600 [Urine:600]   Physical Exam:  Cardiovascular: RRR, soft systolic murmur Pulmonary: Clear to auscultation bilaterally Abdomen: Soft, non tender, bowel sounds present. Extremities: No lower extremity edema. Feet warm and pulses intact. Wound: Right groin wound is clean and dry.  No erythema or signs of infection.  Lab Results: CBC: Recent Labs  04/16/16 1057 04/17/16 0405  WBC 6.4 8.2  HGB 13.6 14.0  HCT 40.3 41.8  PLT 122* 122*   BMET:  Recent Labs  04/16/16 0934 04/16/16 1051 04/17/16 0405  NA 135 139 139  K 5.0 4.2 4.3  CL 99*  --  101  CO2  --   --  32  GLUCOSE 186* 127* 111*  BUN 20  --  10  CREATININE 0.70  --  0.95  CALCIUM  --   --  8.7*    PT/INR:  Lab Results  Component Value Date   INR 1.30 04/16/2016   INR 1.11 04/12/2016   INR 1.04 01/12/2016   ABG:  INR: Will add last result for INR, ABG once components are confirmed Will add last 4 CBG results once components are confirmed  Assessment/Plan:  1. CV - SR in the 70's. On Lopressor 12.5 mg bid, Losartan 50 mg daily, and Plavix 75 mg daily. 2.  Pulmonary  - On room air. Encourage incentive spirometer 3. Obtain routine follow up echo and then will discharge    ZIMMERMAN,DONIELLE MPA-C 04/18/2016,7:22 AM  I have seen and examined the patient and agree with the assessment and plan as outlined.  D/C home today once echo done - didn't get done yesterday  Rexene Alberts, MD 04/18/2016 8:53 AM

## 2016-04-18 NOTE — Progress Notes (Signed)
    Subjective:  The patient is feeling well. He complains of constipation. No chest pain or shortness of breath.  Objective:  Vital Signs in the last 24 hours: Temp:  [98.1 F (36.7 C)-98.5 F (36.9 C)] 98.1 F (36.7 C) (09/14 0416) Pulse Rate:  [76-89] 79 (09/14 0416) Resp:  [20-33] 20 (09/14 0416) BP: (120-146)/(72-82) 146/82 (09/14 0416) SpO2:  [92 %-98 %] 95 % (09/14 0416) Weight:  [83.4 kg (183 lb 12.8 oz)] 83.4 kg (183 lb 12.8 oz) (09/14 0416)  Intake/Output from previous day: 09/13 0701 - 09/14 0700 In: 0  Out: 600 [Urine:600]  Physical Exam: Pt is alert and oriented, NAD HEENT: normal Neck: JVP - normal Lungs: CTA bilaterally CV: RRR with 2/6 systolic murmur at the left upper sternal border and 2/6 early diastolic murmur at the left upper sternal border Abd: soft, NT Ext: no C/C/E, distal pulses intact and equal Skin: warm/dry no rash   Lab Results:  Recent Labs  04/16/16 1057 04/17/16 0405  WBC 6.4 8.2  HGB 13.6 14.0  PLT 122* 122*    Recent Labs  04/16/16 0934 04/16/16 1051 04/17/16 0405  NA 135 139 139  K 5.0 4.2 4.3  CL 99*  --  101  CO2  --   --  32  GLUCOSE 186* 127* 111*  BUN 20  --  10  CREATININE 0.70  --  0.95   No results for input(s): TROPONINI in the last 72 hours.  Invalid input(s): CK, MB  Cardiac Studies: 2-D echocardiogram pending  Tele: Normal sinus rhythm  Assessment/Plan:  1. Severe aortic stenosis now postoperative day 2 from TAVR. Patient continues to progress well. Will continue aspirin and Plavix. Reviewed instructions that he will need to stay on Plavix for a period of 6 months. Heart rhythm is stable. He is ready for discharge after his echocardiogram is completed.  2. Essential hypertension: Blood pressure is well controlled on losartan and metoprolol.  3. Hyperlipidemia: Treated with simvastatin  Disposition: Anticipate discharge home today after his echo is completed. Reviewed post TAVR restrictions. Valve  clinic appointment with 30-day echo schedule on Oct 9th.  [name], M.D. 04/18/2016, 7:47 AM

## 2016-04-18 NOTE — Discharge Summary (Signed)
Physician Discharge Summary       [address]       [place] [address]             [phone]    Patient ID: Ronald Thompson MRN: [id number] DOB/AGE: 1955/08/14 60 y.o.  Admit date: 04/16/2016 Discharge date: 04/18/2016  Admission Diagnoses: Severe aortic stenosis  Active Diagnoses:  1. Hypertension 2. Hypercholesterolemia 3. 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 Primary neuroendocrine tumor of pancreas (Stage IV with multiple mets to liver) 5. Pulmonary fibrosis ([facility]) 6. 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 Lesion of left lung-hx.25 yrs ago- testicilar cancer related- only scarring left after tx.-no problems now   Procedure (s):   Transcatheter Aortic Valve Replacement - Percutaneous Right Transfemoral Approach             Edwards Sapien 3 THV (size 26 mm, model # 9600TFX, serial # [id number]) by Dr. Roxy Manns and Burt Knack on 04/16/2016.  History of Presenting Illness:  Patient is a 60 year old male with history of aortic stenosis, hypertension, hypercholesterolemia, testicular seminoma treated with chemotherapy and high-dose radiation therapy to the chest and mediastinum, pulmonary fibrosis secondary to radiation therapy, and stage IV metastatic primary neuroendocrine tumor of the pancreas who has been referred for a second surgical opinion to discuss treatment options for management of stage D severe symptomatic aortic stenosis.   The patient originally presented in 1990 with stage IV testicular seminoma associated with metastatic involvement of the mediastinum and left chest. He underwent intensive chemotherapy and radiation therapy to the chest and mediastinum. 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 He also developed pulmonary fibrosis and pericarditis secondary to radiation therapy.  He later  developed aortic stenosis which has slowly progressed over time for which the patient has been followed by Dr. Acie Fredrickson. In 2015 he was diagnosed with primary neuroendocrine tumor of the pancreas with metastatic involvement of the liver. He has been treated using Sandostatin and disease has been stable with very slow progression over the past 2 years. During this period of time the patient has developed progressive exertional shortness of breath and fatigue. Follow-up echocardiogram have revealed progression in severity of aortic stenosis. Most recent echocardiogram performed 12/06/2015 revealed severe aortic stenosis with peak velocity across the aortic valve measured between 3.7 and 4.1 m/s corresponding to mean transvalvular gradient estimated at least 39 mmHg, dimensionless velocity index only 0.27, and aortic valve area calculated 0.85 cm. There is moderate aortic insufficiency.  Left ventricular systolic function remains normal. The patient was referred Dr. Burt Knack and underwent diagnostic cardiac catheterization on 01/24/2016. The patient was found to have no significant coronary artery disease.  The patient was subsequently referred for surgical consultation and evaluated by Dr. Cyndia Bent on 02/08/2016. The patient was felt to be at least moderate risk for conventional surgical aortic valve replacement and CT angiography has been performed to evaluate the feasibility of transcatheter aortic valve replacement as an alternative. The patient has been referred for second surgical opinion.  The patient is married and lives locally in [place] with his wife. He has 3 grown children and numerous grandchildren. 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 The patient has been physically active all of his adult life and has enjoyed regular exercise up until recently. However, over the past few years the patient has  experienced slow gradual progressive decline in  his exercise tolerance secondary to exertional shortness of breath and fatigue. The patient now gets short of breath with moderate activity such as walking up a flight of stairs. He occasionally wakes up short of breath at night but typically he can lay flat in bed. He denies any history of resting shortness of breath, orthopnea, dizzy spells, or syncope. He has never had any chest pain or chest tightness either with activity or at rest. He has not had any pain in his abdomen or back which might be attributed to his known malignancy.  Appetite is stable and the patient has not been losing weight.   The patient was counseled at length regarding treatment alternatives for management of severe symptomatic aortic stenosis. Alternative approaches such as conventional aortic valve replacement, transcatheter aortic valve replacement, and palliative medical therapy were compared and contrasted at length.  The natural history of severe aortic stenosis was discussed at length and put into contact with regards to the patient's known malignancy. The risks associated with conventional surgical aortic valve replacement were been discussed in detail, as were expectations for post-operative convalescence. Long-term prognosis with medical therapy was discussed. This discussion was placed in the context of the patient's own specific clinical presentation and past medical history.  All of his questions been addressed.  The patient is interested in proceeding with transcatheter aortic valve replacement as an alternative to conventional surgery associated with at least moderate risk. He was admitted on 04/16/2016 in order to undergo transcatheter aortic valve replacement via right femoral approach.  Brief Hospital Course:  He has remained afebrile and hemodynamically stable. He was started on ecasa 81 mg daily and Plavix 75 mg daily. He was started on Lopressor and restarted on Losartan. His right groin wound was clean and dry.  There was no hematoma. Bilateral lower extremities were warm and pulses were intact. Post op echo was done on 09/14. Results showed LVEF 50-55%, wall motion was normal, trivial aortic regurgitation (valvular vs paravalvular), and mild MR. He is felt surgically stable for discharge today.   Latest Vital Signs: Blood pressure (!) 146/82, pulse 79, temperature 98.1 F (36.7 C), temperature source Oral, resp. rate 20, height 5\' 10"  (1.778 m), weight 183 lb 12.8 oz (83.4 kg), SpO2 95 %.  Physical Exam: Cardiovascular: RRR, soft systolic murmur Pulmonary: Clear to auscultation bilaterally Abdomen: Soft, non tender, bowel sounds present. Extremities: No lower extremity edema. Feet warm and pulses intact. Wound: Clean and dry.  No erythema or signs of infection.   Discharge Condition:Stable and discharged to home.  Recent laboratory studies:  Lab Results  Component Value Date   WBC 8.2 04/17/2016   HGB 14.0 04/17/2016   HCT 41.8 04/17/2016   MCV 94.8 04/17/2016   PLT 122 (L) 04/17/2016   Lab Results  Component Value Date   NA 139 04/17/2016   K 4.3 04/17/2016   CL 101 04/17/2016   CO2 32 04/17/2016   CREATININE 0.95 04/17/2016   GLUCOSE 111 (H) 04/17/2016    Diagnostic Studies:   Dg Chest Port 1 View  Result Date: 04/17/2016 CLINICAL DATA:  Status post transcatheter aortic valve replacement. EXAM: PORTABLE CHEST 1 VIEW COMPARISON:  Portable chest x-ray of April 16, 2016 FINDINGS: The right lung is mildly hyperinflated. There is stable elevation of the left hemidiaphragm with resultant volume loss. The interstitial markings on the left are slightly less conspicuous today. The cardiac silhouette is enlarged. The retrocardiac region remains dense. There  is calcification within the wall of the aortic arch with mild prominence of the arch noted. The Swan-Ganz catheter is been removed. The right internal jugular Cordis sheath is stable. IMPRESSION: 1. Stable cardiomegaly and mild  central pulmonary vascular congestion. Slight decrease in interstitial edema. Stable subsegmental atelectasis on the left associated with volume loss due to the elevated hemidiaphragm. 2. Prominence of the aortic arch with mural calcification, stable. Electronically Signed   By: David  Martinique M.D.   On: 04/17/2016 07:34   Discharge Medications:   Medication List    TAKE these medications   acetaminophen 500 MG tablet Commonly known as:  TYLENOL Take 1,000 mg by mouth every 6 (six) hours as needed for headache.   aspirin EC 81 MG tablet Take 1 tablet (81 mg total) by mouth daily. What changed:  when to take this   calcium carbonate 500 MG chewable tablet Commonly known as:  TUMS - dosed in mg elemental calcium Chew 3 tablets by mouth as needed for indigestion or heartburn.   clopidogrel 75 MG tablet Commonly known as:  PLAVIX Take 1 tablet (75 mg total) by mouth daily with breakfast.   docusate sodium 100 MG capsule Commonly known as:  COLACE Take 100 mg by mouth daily as needed for mild constipation.   ibuprofen 200 MG tablet Commonly known as:  ADVIL,MOTRIN Take 400 mg by mouth every 6 (six) hours as needed for headache or mild pain.   losartan 50 MG tablet Commonly known as:  COZAAR Take 1 tablet (50 mg total) by mouth every morning.   metoprolol tartrate 25 MG tablet Commonly known as:  LOPRESSOR Take 0.5 tablets (12.5 mg total) by mouth 2 (two) times daily.   SANDOSTATIN IJ Inject 1 each as directed every 30 (thirty) days.   sildenafil 20 MG tablet Commonly known as:  REVATIO Take 20 mg by mouth as needed (for ED).   simvastatin 20 MG tablet Commonly known as:  ZOCOR TAKE 1 TABLET (20 MG TOTAL) BY MOUTH AT BEDTIME.   traMADol 50 MG tablet Commonly known as:  ULTRAM Take 1 tablet (50 mg total) by mouth every 4 (four) hours as needed for moderate pain.      The patient has been discharged on:   1.Beta Blocker:  Yes [ x  ]                              No    [   ]                              If No, reason:  2.Ace Inhibitor/ARB: Yes [  x ]                                     No  [    ]                                     If No, reason:  3.Statin:   Yes [  x ]                  No  [   ]  If No, reason:  4.Ecasa:  Yes  [  x ]                  No   [   ]                  If No, reason: Follow Up Appointments: Follow-up Information    [name], MD Follow up on 05/13/2016.   Specialty:  Cardiology Why:  Echo to be taken at 10:30 am and appointment time is at 11:30 am Contact information: [address] [place] [address] [phone]           Signed: Lars Pinks MPA-C 04/18/2016, 8:23 AM

## 2016-04-18 NOTE — Progress Notes (Signed)
  Echocardiogram 2D Echocardiogram has been performed.  Ronald Thompson 04/18/2016, 12:22 PM

## 2016-04-18 NOTE — Discharge Instructions (Signed)
ACTIVITY AND EXERCISE °• Daily activity and exercise are an important part °of your recovery. People recover at different rates °depending on their general health and type of °valve procedure. °• Most people require six to 10 weeks to feel °recovered. °• No lifting, pushing, pulling more than 10 pounds °(examples to avoid: groceries, vacuuming, °gardening, golfing): °- For one week with a procedure through the groin. °- For six weeks for procedures through the chest °wall. °- For three months for procedures through the °breast-bone. °• After the initial healing process of the access site, °we recommend cardiac rehabilitation for all TAVR °patients. Cardiac rehabilitation will help you: °- Rebuild stamina, strength and balance. °- Learn how to participate in activities safely, as well °as help you regain confidence to do so. °- Return to activities of daily living and leisure. °• Discuss attending cardiac rehabilitation at your °follow-up appointment  ° °DRIVING °• Do not drive for four weeks after the date of your °procedure. °• If you have been told by your doctor in the past °that you may not drive, you must talk with him/her °before you begin driving again. °• When you resume driving, you must have someone °with you. ° °HYGIENE °If you had a femoral (leg) procedure, you may take a shower when you return home. After the shower, pat the °site dry. Do NOT use powder, oils or lotions in your groin area until the site has completely healed. °• If you had a chest procedure, you may shower when you return home unless specifically instructed not to by °your discharging practitioner. °- DO NOT scrub incision; pat dry with a towel °- DO NOT apply any lotions, oils, powders to the incision °- No tub baths / swimming for at least six weeks. ° °SITE CARE °• You likely will have small openings in both groins °from catheters used during the procedure. If you °had a transfemoral procedure, one groin will have a °larger opening  and may be bruised or tender. °• If you had a chest procedure, you will have either a °small incision in your upper sternum (breast-bone) °or between your ribs on your left side. °- Chest wall site: The surgical incision should be °kept dry (no lotions / oils / powders) and open °to air. If you experience irritation from clothing °rubbing on the incision, a light gauze dressing °may be applied. °- Inspect your incision daily; notify your °physician if there is increased redness, swelling °or drainage from the incision. °- If the incision is located on your breast-bone °you must avoid lifting objects heavier than a °gallon of milk (eight pounds) and stretching / °twisting / pulling with your arms for at least °three months to ensure strong bone healing. ° ° °CONTACT [phone] °• Check your sites daily. Contact our office if you have °any of the following problems: °- Redness and warmth that does not go away °- Yellow or green drainage from the wound °- Fever and chills °- Increasing numbness in your legs °- Worsening pain at the site °• If you had a leg/groin procedure, it is normal to °have bruising or a soft lump at the site. It is not °normal if the lump suddenly becomes larger or °more firm. This may mean you are bleeding. If this °happens: °- Lie down °- Have someone press down hard, just above °the hole in your skin where the procedure was °performed for 15 minutes. If after holding on the °site, the lump does not become larger or harder, °they   are performing this correctly. °- If the bleeding has stopped after 15 minutes, rest °and stay laying down for at least two hours. °- If the bleeding continues, call 911 for an °ambulance. Do NOT drive yourself or have °someone else drive you. °

## 2016-04-18 NOTE — Progress Notes (Signed)
Patient will D/C home with wife. D/C education done. IV removed. Tele monitor removed. CCMD notified. Patient belongings given to wife including his wallet, glasses, and cell phone. Patient to D/C home with wife.   Domingo Dimes RN

## 2016-04-18 NOTE — Progress Notes (Signed)
CARDIAC REHAB PHASE I   PRE:  Rate/Rhythm: 80 SR  BP:  Supine:   Sitting: 149/79  Standing:    SaO2: 95% RA  MODE:  Ambulation: 700 ft   POST:  Rate/Rhythem: 96 SR  BP:  Supine:   Sitting: 159/93  Standing:    SaO2: 95% RA [id number]  Pt walked 72ft with no assistance and no c/o of SOB or CP.  Reviewed exercise ed and encouraged heart healthy diet.  Discussed CRPII and will refer to CRPII [place].    Ronald Thompson

## 2016-04-26 ENCOUNTER — Telehealth: Payer: Self-pay | Admitting: Oncology

## 2016-04-26 ENCOUNTER — Ambulatory Visit (HOSPITAL_BASED_OUTPATIENT_CLINIC_OR_DEPARTMENT_OTHER): Payer: BLUE CROSS/BLUE SHIELD | Admitting: Oncology

## 2016-04-26 ENCOUNTER — Ambulatory Visit (HOSPITAL_BASED_OUTPATIENT_CLINIC_OR_DEPARTMENT_OTHER): Payer: BLUE CROSS/BLUE SHIELD

## 2016-04-26 VITALS — BP 139/80 | HR 75 | Temp 98.0°F | Resp 17 | Ht 70.0 in | Wt 184.1 lb

## 2016-04-26 DIAGNOSIS — D3A8 Other benign neuroendocrine tumors: Secondary | ICD-10-CM

## 2016-04-26 DIAGNOSIS — C7A8 Other malignant neuroendocrine tumors: Secondary | ICD-10-CM

## 2016-04-26 DIAGNOSIS — Z23 Encounter for immunization: Secondary | ICD-10-CM

## 2016-04-26 DIAGNOSIS — C7B8 Other secondary neuroendocrine tumors: Secondary | ICD-10-CM

## 2016-04-26 MED ORDER — INFLUENZA VAC SPLIT QUAD 0.5 ML IM SUSY
0.5000 mL | PREFILLED_SYRINGE | Freq: Once | INTRAMUSCULAR | Status: AC
  Administered 2016-04-26: 0.5 mL via INTRAMUSCULAR
  Filled 2016-04-26: qty 0.5

## 2016-04-26 MED ORDER — OCTREOTIDE ACETATE 30 MG IM KIT
30.0000 mg | PACK | Freq: Once | INTRAMUSCULAR | Status: AC
  Administered 2016-04-26: 30 mg via INTRAMUSCULAR
  Filled 2016-04-26: qty 1

## 2016-04-26 NOTE — Progress Notes (Signed)
  [facility] OFFICE PROGRESS NOTE   Diagnosis: Pancreas neuroendocrine tumor  INTERVAL HISTORY:   Ronald Thompson returns as scheduled. He underwent a TAVR on 04/16/2016. He reports an uneventful operative recovery. He has noted improvement in his energy level and exertional dyspnea following surgery. No diarrhea. His appetite is improving.  Objective:  Vital signs in last 24 hours:  Blood pressure 139/80, pulse 75, temperature 98 F (36.7 C), temperature source Oral, resp. rate 17, height 5\' 10"  (1.778 m), weight 184 lb 1.6 oz (83.5 kg), SpO2 99 %.    HEENT: Resolving ecchymosis at the right lower neck Resp: Decreased breath sounds at the left upper chest, no respiratory distress Cardio: Regular rate and rhythm GI: No hepatomegaly, nontender Vascular: No leg edema   Lab Results:  Lab Results  Component Value Date   WBC 8.2 04/17/2016   HGB 14.0 04/17/2016   HCT 41.8 04/17/2016   MCV 94.8 04/17/2016   PLT 122 (L) 04/17/2016   NEUTROABS 6.5 11/08/2015     Medications: I have reviewed the patient's current medications.  Assessment/Plan: 1. Pancreatic neuroendocrine tumor, WHO grade 2, pancreatic head mass and small peripancreatic/celiac nodes on a CT 02/04/2014  EUS revealed evidence of multiple liver metastases-status post an FNA biopsy of a left liver lesion 02/10/2014 confirming a neuroendocrine tumor   Octreotide scan 04/01/2014 with multiple foci of metastatic neuroendocrine tumor in the liver  Monthly Sandostatin started 03/16/2014  Restaging CT 07/04/2014 with resolution of a previously noted pancreas head lesion and probable progression of liver metastases  Restaging CT 10/31/2014-stable liver lesions, no evidence of a pancreas mass, stable.  Restaging CT 02/28/2015-stable hepatic metastases, stable pancreas lesion unchanged  Restaging CT 08/30/2015-stable pancreas mass, slight enlargement of hepatic metastases  Monthly Sandostatin  continued  Restaging CTs 01/17/2016 revealed enlargement of liver lesions, no new lesions, enlargement of the pancreas head mass  2. Chest Seminoma in 1990 treated with BEP and chest radiation at [facility] 3. 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 chronic exertional dyspnea following treatment for the seminoma  5. aortic stenosis on an echocardiogram December 2011, severe aortic stenosis on echocardiogram 12/06/2015, status post TAVR on 04/16/2016 6. lumbar disc surgery 2014  7. acute abdominal pain 02/04/2014-resolved     Disposition:  He has improved dyspnea following theTAVR procedure. He continues monthly Sandostatin for treatment of the metastatic pancreas neuroendocrine tumor. We discussed systemic treatment options. I will refer him to Dr. Leamon Arnt to consider treatment on the lanreotide/pembrolizumab study or Luthera therapy.   He will return for an office visit in 2 months. He received an influenza vaccine today.   Ronald Coder, MD  04/26/2016  11:04 AM

## 2016-04-26 NOTE — Telephone Encounter (Signed)
GAVE PATIENT AVS REPORT AND APPOINTMENTS FOR October AND November. MESSAGE TO HIM TEAM RE REFERRAL TO DR Leamon Arnt AT [facility].

## 2016-04-26 NOTE — Patient Instructions (Signed)
Influenza Virus Vaccine (Flucelvax) What is this medicine? INFLUENZA VIRUS VACCINE (in floo EN zuh VAHY ruhs vak SEEN) helps to reduce the risk of getting influenza also known as the flu. The vaccine only helps protect you against some strains of the flu. This medicine may be used for other purposes; ask your health care provider or pharmacist if you have questions. What should I tell my health care provider before I take this medicine? They need to know if you have any of these conditions: -bleeding disorder like hemophilia -fever or infection -Guillain-Barre syndrome or other neurological problems -immune system problems -infection with the human immunodeficiency virus (HIV) or AIDS -low blood platelet counts -multiple sclerosis -an unusual or allergic reaction to influenza virus vaccine, other medicines, foods, dyes or preservatives -pregnant or trying to get pregnant -breast-feeding How should I use this medicine? This vaccine is for injection into a muscle. It is given by a health care professional. A copy of Vaccine Information Statements will be given before each vaccination. Read this sheet carefully each time. The sheet may change frequently. Talk to your pediatrician regarding the use of this medicine in children. Special care may be needed. Overdosage: If you think you've taken too much of this medicine contact a poison control center or emergency room at once. Overdosage: If you think you have taken too much of this medicine contact a poison control center or emergency room at once. NOTE: This medicine is only for you. Do not share this medicine with others. What if I miss a dose? This does not apply. What may interact with this medicine? -chemotherapy or radiation therapy -medicines that lower your immune system like etanercept, anakinra, infliximab, and adalimumab -medicines that treat or prevent blood clots like warfarin -phenytoin -steroid medicines like prednisone or  cortisone -theophylline -vaccines This list may not describe all possible interactions. Give your health care provider a list of all the medicines, herbs, non-prescription drugs, or dietary supplements you use. Also tell them if you smoke, drink alcohol, or use illegal drugs. Some items may interact with your medicine. What should I watch for while using this medicine? Report any side effects that do not go away within 3 days to your doctor or health care professional. Call your health care provider if any unusual symptoms occur within 6 weeks of receiving this vaccine. You may still catch the flu, but the illness is not usually as bad. You cannot get the flu from the vaccine. The vaccine will not protect against colds or other illnesses that may cause fever. The vaccine is needed every year. What side effects may I notice from receiving this medicine? Side effects that you should report to your doctor or health care professional as soon as possible: -allergic reactions like skin rash, itching or hives, swelling of the face, lips, or tongue Side effects that usually do not require medical attention (Report these to your doctor or health care professional if they continue or are bothersome.): -fever -headache -muscle aches and pains -pain, tenderness, redness, or swelling at the injection site -tiredness This list may not describe all possible side effects. Call your doctor for medical advice about side effects. You may report side effects to FDA at 1-800-FDA-1088. Where should I keep my medicine? The vaccine will be given by a health care professional in a clinic, pharmacy, doctor's office, or other health care setting. You will not be given vaccine doses to store at home. NOTE: This sheet is a summary. It may not cover   all possible information. If you have questions about this medicine, talk to your doctor, pharmacist, or health care provider.    2016, Elsevier/Gold Standard. (2011-07-03  14:06:47) Octreotide injection solution What is this medicine? OCTREOTIDE (ok TREE oh tide) is used to reduce blood levels of growth hormone in patients with a condition called acromegaly. This medicine also reduces flushing and watery diarrhea caused by certain types of cancer. This medicine may be used for other purposes; ask your health care provider or pharmacist if you have questions. What should I tell my health care provider before I take this medicine? They need to know if you have any of these conditions: -gallbladder disease -kidney disease -liver disease -an unusual or allergic reaction to octreotide, other medicines, foods, dyes, or preservatives -pregnant or trying to get pregnant -breast-feeding How should I use this medicine? This medicine is for injection under the skin or into a vein (only in emergency situations). It is usually given by a health care professional in a hospital or clinic setting. If you get this medicine at home, you will be taught how to prepare and give this medicine. Allow the injection solution to come to room temperature before use. Do not warm it artificially. Use exactly as directed. Take your medicine at regular intervals. Do not take your medicine more often than directed. It is important that you put your used needles and syringes in a special sharps container. Do not put them in a trash can. If you do not have a sharps container, call your pharmacist or healthcare provider to get one. Talk to your pediatrician regarding the use of this medicine in children. Special care may be needed. Overdosage: If you think you have taken too much of this medicine contact a poison control center or emergency room at once. NOTE: This medicine is only for you. Do not share this medicine with others. What if I miss a dose? If you miss a dose, take it as soon as you can. If it is almost time for your next dose, take only that dose. Do not take double or extra  doses. What may interact with this medicine? Do not take this medicine with any of the following medications: -cisapride -droperidol -general anesthetics -grepafloxacin -perphenazine -thioridazine This medicine may also interact with the following medications: -bromocriptine -cyclosporine -diuretics -medicines for blood pressure, heart disease, irregular heart beat -medicines for diabetes, including insulin -quinidine This list may not describe all possible interactions. Give your health care provider a list of all the medicines, herbs, non-prescription drugs, or dietary supplements you use. Also tell them if you smoke, drink alcohol, or use illegal drugs. Some items may interact with your medicine. What should I watch for while using this medicine? Visit your doctor or health care professional for regular checks on your progress. To help reduce irritation at the injection site, use a different site for each injection and make sure the solution is at room temperature before use. This medicine may cause increases or decreases in blood sugar. Signs of high blood sugar include frequent urination, unusual thirst, flushed or dry skin, difficulty breathing, drowsiness, stomach ache, nausea, vomiting or dry mouth. Signs of low blood sugar include chills, cool, pale skin or cold sweats, drowsiness, extreme hunger, fast heartbeat, headache, nausea, nervousness or anxiety, shakiness, trembling, unsteadiness, tiredness, or weakness. Contact your doctor or health care professional right away if you experience any of these symptoms. What side effects may I notice from receiving this medicine? Side effects that you  should report to your doctor or health care professional as soon as possible: -allergic reactions like skin rash, itching or hives, swelling of the face, lips, or tongue -changes in blood sugar -changes in heart rate -severe stomach pain Side effects that usually do not require medical  attention (report to your doctor or health care professional if they continue or are bothersome): -diarrhea or constipation -gas or stomach pain -nausea, vomiting -pain, redness, swelling and irritation at site where injected This list may not describe all possible side effects. Call your doctor for medical advice about side effects. You may report side effects to FDA at 1-800-FDA-1088. Where should I keep my medicine? Keep out of the reach of children. Store in a refrigerator between 2 and 8 degrees C (36 and 46 degrees F). Protect from light. Allow to come to room temperature naturally. Do not use artificial heat. If protected from light, the injection may be stored at room temperature between 20 and 30 degrees C (70 and 86 degrees F) for 14 days. After the initial use, throw away any unused portion of a multiple dose vial after 14 days. Throw away unused portions of the ampules after use. NOTE: This sheet is a summary. It may not cover all possible information. If you have questions about this medicine, talk to your doctor, pharmacist, or health care provider.    2016, Elsevier/Gold Standard. (2008-02-16 16:56:04)

## 2016-04-28 ENCOUNTER — Other Ambulatory Visit: Payer: Self-pay | Admitting: Internal Medicine

## 2016-04-30 ENCOUNTER — Telehealth: Payer: Self-pay | Admitting: Oncology

## 2016-04-30 NOTE — Telephone Encounter (Signed)
PT APPT WITH DR MORSE AT [facility] IS 05/20/16@4 :[place] FAXED RELEASE ID [id number]

## 2016-05-13 ENCOUNTER — Encounter: Payer: Self-pay | Admitting: Cardiovascular Disease

## 2016-05-13 ENCOUNTER — Encounter (INDEPENDENT_AMBULATORY_CARE_PROVIDER_SITE_OTHER): Payer: Self-pay

## 2016-05-13 ENCOUNTER — Other Ambulatory Visit: Payer: Self-pay

## 2016-05-13 ENCOUNTER — Ambulatory Visit (HOSPITAL_COMMUNITY): Payer: BLUE CROSS/BLUE SHIELD | Attending: Cardiovascular Disease

## 2016-05-13 ENCOUNTER — Ambulatory Visit (INDEPENDENT_AMBULATORY_CARE_PROVIDER_SITE_OTHER): Payer: BLUE CROSS/BLUE SHIELD | Admitting: Cardiovascular Disease

## 2016-05-13 VITALS — BP 152/80 | HR 74 | Ht 70.0 in | Wt 184.2 lb

## 2016-05-13 DIAGNOSIS — Z952 Presence of prosthetic heart valve: Secondary | ICD-10-CM

## 2016-05-13 DIAGNOSIS — I350 Nonrheumatic aortic (valve) stenosis: Secondary | ICD-10-CM

## 2016-05-13 DIAGNOSIS — I342 Nonrheumatic mitral (valve) stenosis: Secondary | ICD-10-CM | POA: Diagnosis not present

## 2016-05-13 DIAGNOSIS — I517 Cardiomegaly: Secondary | ICD-10-CM | POA: Insufficient documentation

## 2016-05-13 DIAGNOSIS — I071 Rheumatic tricuspid insufficiency: Secondary | ICD-10-CM | POA: Insufficient documentation

## 2016-05-13 MED ORDER — METOPROLOL TARTRATE 25 MG PO TABS: 12.5000 mg | ORAL_TABLET | Freq: Two times a day (BID) | ORAL | 11 refills | Status: DC

## 2016-05-13 MED ORDER — CLINDAMYCIN HCL 300 MG PO CAPS: ORAL_CAPSULE | ORAL | 2 refills | Status: DC

## 2016-05-13 MED ORDER — CLOPIDOGREL BISULFATE 75 MG PO TABS: 75.0000 mg | ORAL_TABLET | Freq: Every day | ORAL | 11 refills | Status: DC

## 2016-05-13 NOTE — Patient Instructions (Addendum)
Medication Instructions:  Your physician recommends that you continue on your current medications as directed. Please refer to the Current Medication list given to you today.  STOP Plavix (clopidogrel) 10/14/16  Labwork: No new orders.   Testing/Procedures: Your physician has requested that you have an echocardiogram in 1 YEAR. Echocardiography is a painless test that uses sound waves to create images of your heart. It provides your doctor with information about the size and shape of your heart and how well your heart's chambers and valves are working. This procedure takes approximately one hour. There are no restrictions for this procedure.  Follow-Up: Your physician wants you to follow-up in: 1 YEAR with Dr Burt Knack.  You will receive a reminder letter in the mail two months in advance. If you don't receive a letter, please call our office to schedule the follow-up appointment.   Any Other Special Instructions Will Be Listed Below (If Applicable).  Your physician discussed the importance of taking an antibiotic prior to any dental, gastrointestinal, genitourinary procedures to prevent damage to the heart valves from infection. You were given a prescription for an antibiotic based on current SBE prophylaxis guidelines.   If you need a refill on your cardiac medications before your next appointment, please call your pharmacy.

## 2016-05-13 NOTE — Progress Notes (Signed)
Cardiology Office Note Date:  05/13/2016   ID:  Ronald Thompson, DOB 1955/08/18, MRN [id number]  PCP:  Cathlean Cower, MD  Cardiologist:  [name], MD    Chief Complaint  Patient presents with  . Aortic Stenosis     History of Present Illness: Ronald Thompson is a 60 y.o. male who presents for Follow-up evaluation. He underwent TAVR on April 16, 2016 via a percutaneous transfemoral approach. He had an uncomplicated postoperative course and was discharged on postoperative day #2.  The patient reports that he is doing well. He has mild shortness of breath with walking up hills or doing vigorous work. With all of his normal activities he feels fine. He denies chest pain, chest pressure, lightheadedness, or heart palpitations. No leg swelling.   Past Medical History:  Diagnosis Date  . Baker's cyst   . Gallstones   . Heart murmur   . Hypercholesteremia   . Hypertension   . Lesion of left lung    hx.25 yrs ago- testicilar cancer related- only scarring left after tx.-no problems now  . Liver metastasis ([place])   . 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  . Pericarditis    2nd to tumor  . Pneumonia 1990  . Primary neuroendocrine tumor of pancreas 02/14/2014   Stage IV with multiple mets to liver  . Pulmonary fibrosis ([place]) 11/08/2015  . S/P TAVR (transcatheter aortic valve replacement) 04/16/2016   26 mm Edwards Sapien 3 transcatheter heart valve placed via percutaneous right transfemoral approach   . Shortness of breath dyspnea    with exertion and when tired  . 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 Transfusion history    hx. 25 yrs ago-during cancer tx.    Past Surgical History:  Procedure Laterality Date  . BACK SURGERY     '14- rupt. disc   . CARDIAC CATHETERIZATION N/A 01/24/2016   Procedure: Right/Left Heart Cath  and Coronary Angiography;  Surgeon: [name], MD;  Location: [facility];  Service: Cardiovascular;  Laterality: N/A;  . COLONOSCOPY    . EUS N/A 02/10/2014   Procedure: UPPER ENDOSCOPIC ULTRASOUND (EUS) LINEAR;  Surgeon: [name], MD;  Location: [facility];  Service: Endoscopy;  Laterality: N/A;  . KNEE SURGERY Right    age 52 for Baker's cyst  . NASAL SEPTUM SURGERY     Deviated septrum  . TEE WITHOUT CARDIOVERSION N/A 04/16/2016   Procedure: TRANSESOPHAGEAL ECHOCARDIOGRAM (TEE);  Surgeon: [name], MD;  Location: [facility];  Service: Open Heart Surgery;  Laterality: N/A;  . THORACOTOMY  1990   wedge biopsy of mediastinal mass  . TRANSCATHETER AORTIC VALVE REPLACEMENT, TRANSFEMORAL N/A 04/16/2016   Procedure: TRANSCATHETER AORTIC VALVE REPLACEMENT, TRANSFEMORAL;  Surgeon: [name], MD;  Location: [facility];  Service: Open Heart Surgery;  Laterality: N/A;    Current Outpatient Prescriptions  Medication Sig Dispense Refill  . acetaminophen (TYLENOL) 500 MG tablet Take 1,000 mg by mouth every 6 (six) hours as needed for headache.    Marland Kitchen aspirin EC 81 MG tablet Take 81 mg by mouth every evening.    . calcium carbonate (TUMS - DOSED IN MG ELEMENTAL CALCIUM) 500 MG chewable tablet Chew 3 tablets by mouth 3 (three) times daily as needed for indigestion or heartburn.     . clopidogrel (PLAVIX) 75 MG tablet Take  1 tablet (75 mg total) by mouth daily with breakfast. 30 tablet 1  . docusate sodium (COLACE) 100 MG capsule Take 100 mg by mouth daily as needed for mild constipation.    Marland Kitchen ibuprofen (ADVIL,MOTRIN) 200 MG tablet Take 400 mg by mouth every 6 (six) hours as needed for headache or mild pain.     Marland Kitchen losartan (COZAAR) 50 MG tablet TAKE 1 TABLET (50 MG TOTAL) BY MOUTH DAILY. 30 tablet 0  . metoprolol tartrate (LOPRESSOR) 25 MG tablet Take 0.5 tablets (12.5 mg total) by mouth 2 (two) times daily. 30 tablet 1  . Octreotide Acetate (SANDOSTATIN IJ) Inject 1 each as directed every  30 (thirty) days.     . sildenafil (REVATIO) 20 MG tablet Take 20 mg by mouth daily as needed (for ED).     Marland Kitchen simvastatin (ZOCOR) 20 MG tablet TAKE 1 TABLET (20 MG TOTAL) BY MOUTH AT BEDTIME. 30 tablet 5  . traMADol (ULTRAM) 50 MG tablet Take 1 tablet (50 mg total) by mouth every 4 (four) hours as needed for moderate pain. 28 tablet 0   No current facility-administered medications for this visit.     Allergies:   Penicillins   Social History:  The patient  reports that he has never smoked. He has never used smokeless tobacco. He reports that he drinks alcohol. He reports that he does not use drugs.   Family History:  The patient's  family history includes Cancer in his maternal aunt and paternal grandfather; Dementia in his mother; Emphysema in his maternal aunt.    ROS:  Please see the history of present illness.  Otherwise, review of systems is positive for fatigue.  All other systems are reviewed and negative.    PHYSICAL EXAM: VS:  BP (!) 152/80   Pulse 74   Ht 5\' 10"  (1.778 m)   Wt 184 lb 3.2 oz (83.6 kg)   BMI 26.43 kg/m  , BMI Body mass index is 26.43 kg/m. GEN: Well nourished, well developed, in no acute distress  HEENT: normal  Neck: no JVD, no masses. No carotid bruits Cardiac: RRR with 2/6 systolic murmur and tamboric second heart sound Respiratory:  clear to auscultation bilaterally, normal work of breathing GI: soft, nontender, nondistended, + BS MS: no deformity or atrophy  Ext: no pretibial edema, pedal pulses 2+= bilaterally Skin: warm and dry, no rash Neuro:  Strength and sensation are intact Psych: euthymic mood, full affect  EKG:  EKG is not ordered today.  Recent Labs: 11/08/2015: TSH 1.65 04/12/2016: ALT 22 04/17/2016: BUN 10; Creatinine, Ser 0.95; Hemoglobin 14.0; Magnesium 1.9; Platelets 122; Potassium 4.3; Sodium 139   Lipid Panel     Component Value Date/Time   CHOL 158 11/08/2015 1459   TRIG 75.0 11/08/2015 1459   HDL 51.50 11/08/2015 1459    CHOLHDL 3 11/08/2015 1459   VLDL 15.0 11/08/2015 1459   LDLCALC 92 11/08/2015 1459   LDLDIRECT 139.2 10/10/2008 0800      Wt Readings from Last 3 Encounters:  05/13/16 184 lb 3.2 oz (83.6 kg)  04/26/16 184 lb 1.6 oz (83.5 kg)  04/18/16 183 lb 12.8 oz (83.4 kg)     Cardiac Studies Reviewed: 2D Echo: Today's echocardiogram is reviewed. The patient has a mean transaortic valve gradient of approximately 9 mmHg. There is mild perivalvular AI. LV function appears normal. The RV is dilated and hypokinetic, unchanged from previous. Formal echo interpretation is pending. Interpretation above is based on my own review of the  images.  ASSESSMENT AND PLAN: 1.  Aortic valve disease status post TAVR, now with NYHA functional class II symptoms of chronic diastolic heart failure. The patient seems to be doing quite well. He will continue on current medical treatment which includes dual antiplatelet therapy with aspirin and Plavix for a total of 6 months. After 6 months he can discontinue Plavix and stay on indefinite aspirin 81 mg.  2. Essential hypertension: The patient continues on losartan and low-dose metoprolol.  Current medicines are reviewed with the patient today.  The patient does not have concerns regarding medicines.  Labs/ tests ordered today include:  No orders of the defined types were placed in this encounter.   Disposition:   FU one year with an echo at that time.  Deatra James, MD  05/13/2016 11:55 AM    [address], [place], [place]  [address] Phone: [phone]; Fax: [phone]

## 2016-05-24 ENCOUNTER — Ambulatory Visit (HOSPITAL_BASED_OUTPATIENT_CLINIC_OR_DEPARTMENT_OTHER): Payer: BLUE CROSS/BLUE SHIELD

## 2016-05-24 VITALS — BP 130/70 | HR 66 | Temp 98.9°F | Resp 18

## 2016-05-24 DIAGNOSIS — C7A8 Other malignant neuroendocrine tumors: Secondary | ICD-10-CM | POA: Diagnosis not present

## 2016-05-24 DIAGNOSIS — C7B8 Other secondary neuroendocrine tumors: Secondary | ICD-10-CM

## 2016-05-24 DIAGNOSIS — D3A8 Other benign neuroendocrine tumors: Secondary | ICD-10-CM

## 2016-05-24 MED ORDER — OCTREOTIDE ACETATE 30 MG IM KIT
30.0000 mg | PACK | Freq: Once | INTRAMUSCULAR | Status: AC
  Administered 2016-05-24: 30 mg via INTRAMUSCULAR
  Filled 2016-05-24: qty 1

## 2016-05-24 NOTE — Patient Instructions (Addendum)
Influenza Virus Vaccine (Flucelvax) What is this medicine? INFLUENZA VIRUS VACCINE (in floo EN zuh VAHY ruhs vak SEEN) helps to reduce the risk of getting influenza also known as the flu. The vaccine only helps protect you against some strains of the flu. This medicine may be used for other purposes; ask your health care provider or pharmacist if you have questions. What should I tell my health care provider before I take this medicine? They need to know if you have any of these conditions: -bleeding disorder like hemophilia -fever or infection -Guillain-Barre syndrome or other neurological problems -immune system problems -infection with the human immunodeficiency virus (HIV) or AIDS -low blood platelet counts -multiple sclerosis -an unusual or allergic reaction to influenza virus vaccine, other medicines, foods, dyes or preservatives -pregnant or trying to get pregnant -breast-feeding How should I use this medicine? This vaccine is for injection into a muscle. It is given by a health care professional. A copy of Vaccine Information Statements will be given before each vaccination. Read this sheet carefully each time. The sheet may change frequently. Talk to your pediatrician regarding the use of this medicine in children. Special care may be needed. Overdosage: If you think you've taken too much of this medicine contact a poison control center or emergency room at once. Overdosage: If you think you have taken too much of this medicine contact a poison control center or emergency room at once. NOTE: This medicine is only for you. Do not share this medicine with others. What if I miss a dose? This does not apply. What may interact with this medicine? -chemotherapy or radiation therapy -medicines that lower your immune system like etanercept, anakinra, infliximab, and adalimumab -medicines that treat or prevent blood clots like warfarin -phenytoin -steroid medicines like prednisone or  cortisone -theophylline -vaccines This list may not describe all possible interactions. Give your health care provider a list of all the medicines, herbs, non-prescription drugs, or dietary supplements you use. Also tell them if you smoke, drink alcohol, or use illegal drugs. Some items may interact with your medicine. What should I watch for while using this medicine? Report any side effects that do not go away within 3 days to your doctor or health care professional. Call your health care provider if any unusual symptoms occur within 6 weeks of receiving this vaccine. You may still catch the flu, but the illness is not usually as bad. You cannot get the flu from the vaccine. The vaccine will not protect against colds or other illnesses that may cause fever. The vaccine is needed every year. What side effects may I notice from receiving this medicine? Side effects that you should report to your doctor or health care professional as soon as possible: -allergic reactions like skin rash, itching or hives, swelling of the face, lips, or tongue Side effects that usually do not require medical attention (Report these to your doctor or health care professional if they continue or are bothersome.): -fever -headache -muscle aches and pains -pain, tenderness, redness, or swelling at the injection site -tiredness This list may not describe all possible side effects. Call your doctor for medical advice about side effects. You may report side effects to FDA at 1-800-FDA-1088. Where should I keep my medicine? The vaccine will be given by a health care professional in a clinic, pharmacy, doctor's office, or other health care setting. You will not be given vaccine doses to store at home. NOTE: This sheet is a summary. It may not cover   all possible information. If you have questions about this medicine, talk to your doctor, pharmacist, or health care provider.    2016, [employer]. (2011-07-03  14:06:47) Octreotide injection solution What is this medicine? OCTREOTIDE (ok TREE oh tide) is used to reduce blood levels of growth hormone in patients with a condition called acromegaly. This medicine also reduces flushing and watery diarrhea caused by certain types of cancer. This medicine may be used for other purposes; ask your health care provider or pharmacist if you have questions. What should I tell my health care provider before I take this medicine? They need to know if you have any of these conditions: -gallbladder disease -kidney disease -liver disease -an unusual or allergic reaction to octreotide, other medicines, foods, dyes, or preservatives -pregnant or trying to get pregnant -breast-feeding How should I use this medicine? This medicine is for injection under the skin or into a vein (only in emergency situations). It is usually given by a health care professional in a hospital or clinic setting. If you get this medicine at home, you will be taught how to prepare and give this medicine. Allow the injection solution to come to room temperature before use. Do not warm it artificially. Use exactly as directed. Take your medicine at regular intervals. Do not take your medicine more often than directed. It is important that you put your used needles and syringes in a special sharps container. Do not put them in a trash can. If you do not have a sharps container, call your pharmacist or healthcare provider to get one. Talk to your pediatrician regarding the use of this medicine in children. Special care may be needed. Overdosage: If you think you have taken too much of this medicine contact a poison control center or emergency room at once. NOTE: This medicine is only for you. Do not share this medicine with others. What if I miss a dose? If you miss a dose, take it as soon as you can. If it is almost time for your next dose, take only that dose. Do not take double or extra  doses. What may interact with this medicine? Do not take this medicine with any of the following medications: -cisapride -droperidol -general anesthetics -grepafloxacin -perphenazine -thioridazine This medicine may also interact with the following medications: -bromocriptine -cyclosporine -diuretics -medicines for blood pressure, heart disease, irregular heart beat -medicines for diabetes, including insulin -quinidine This list may not describe all possible interactions. Give your health care provider a list of all the medicines, herbs, non-prescription drugs, or dietary supplements you use. Also tell them if you smoke, drink alcohol, or use illegal drugs. Some items may interact with your medicine. What should I watch for while using this medicine? Visit your doctor or health care professional for regular checks on your progress. To help reduce irritation at the injection site, use a different site for each injection and make sure the solution is at room temperature before use. This medicine may cause increases or decreases in blood sugar. Signs of high blood sugar include frequent urination, unusual thirst, flushed or dry skin, difficulty breathing, drowsiness, stomach ache, nausea, vomiting or dry mouth. Signs of low blood sugar include chills, cool, pale skin or cold sweats, drowsiness, extreme hunger, fast heartbeat, headache, nausea, nervousness or anxiety, shakiness, trembling, unsteadiness, tiredness, or weakness. Contact your doctor or health care professional right away if you experience any of these symptoms. What side effects may I notice from receiving this medicine? Side effects that you  should report to your doctor or health care professional as soon as possible: -allergic reactions like skin rash, itching or hives, swelling of the face, lips, or tongue -changes in blood sugar -changes in heart rate -severe stomach pain Side effects that usually do not require medical  attention (report to your doctor or health care professional if they continue or are bothersome): -diarrhea or constipation -gas or stomach pain -nausea, vomiting -pain, redness, swelling and irritation at site where injected This list may not describe all possible side effects. Call your doctor for medical advice about side effects. You may report side effects to FDA at 1-800-FDA-1088. Where should I keep my medicine? Keep out of the reach of children. Store in a refrigerator between 2 and 8 degrees C (36 and 46 degrees F). Protect from light. Allow to come to room temperature naturally. Do not use artificial heat. If protected from light, the injection may be stored at room temperature between 20 and 30 degrees C (70 and 86 degrees F) for 14 days. After the initial use, throw away any unused portion of a multiple dose vial after 14 days. Throw away unused portions of the ampules after use. NOTE: This sheet is a summary. It may not cover all possible information. If you have questions about this medicine, talk to your doctor, pharmacist, or health care provider.    2016, [employer]. (2008-02-16 16:56:04) Octreotide injection solution What is this medicine? OCTREOTIDE (ok TREE oh tide) is used to reduce blood levels of growth hormone in patients with a condition called acromegaly. This medicine also reduces flushing and watery diarrhea caused by certain types of cancer. This medicine may be used for other purposes; ask your health care provider or pharmacist if you have questions. What should I tell my health care provider before I take this medicine? They need to know if you have any of these conditions: -gallbladder disease -kidney disease -liver disease -an unusual or allergic reaction to octreotide, other medicines, foods, dyes, or preservatives -pregnant or trying to get pregnant -breast-feeding How should I use this medicine? This medicine is for injection under the skin  or into a vein (only in emergency situations). It is usually given by a health care professional in a hospital or clinic setting. If you get this medicine at home, you will be taught how to prepare and give this medicine. Allow the injection solution to come to room temperature before use. Do not warm it artificially. Use exactly as directed. Take your medicine at regular intervals. Do not take your medicine more often than directed. It is important that you put your used needles and syringes in a special sharps container. Do not put them in a trash can. If you do not have a sharps container, call your pharmacist or healthcare provider to get one. Talk to your pediatrician regarding the use of this medicine in children. Special care may be needed. Overdosage: If you think you have taken too much of this medicine contact a poison control center or emergency room at once. NOTE: This medicine is only for you. Do not share this medicine with others. What if I miss a dose? If you miss a dose, take it as soon as you can. If it is almost time for your next dose, take only that dose. Do not take double or extra doses. What may interact with this medicine? Do not take this medicine with any of the following medications: -cisapride -droperidol -general anesthetics -grepafloxacin -perphenazine -thioridazine This medicine  may also interact with the following medications: -bromocriptine -cyclosporine -diuretics -medicines for blood pressure, heart disease, irregular heart beat -medicines for diabetes, including insulin -quinidine This list may not describe all possible interactions. Give your health care provider a list of all the medicines, herbs, non-prescription drugs, or dietary supplements you use. Also tell them if you smoke, drink alcohol, or use illegal drugs. Some items may interact with your medicine. What should I watch for while using this medicine? Visit your doctor or health care  professional for regular checks on your progress. To help reduce irritation at the injection site, use a different site for each injection and make sure the solution is at room temperature before use. This medicine may cause increases or decreases in blood sugar. Signs of high blood sugar include frequent urination, unusual thirst, flushed or dry skin, difficulty breathing, drowsiness, stomach ache, nausea, vomiting or dry mouth. Signs of low blood sugar include chills, cool, pale skin or cold sweats, drowsiness, extreme hunger, fast heartbeat, headache, nausea, nervousness or anxiety, shakiness, trembling, unsteadiness, tiredness, or weakness. Contact your doctor or health care professional right away if you experience any of these symptoms. What side effects may I notice from receiving this medicine? Side effects that you should report to your doctor or health care professional as soon as possible: -allergic reactions like skin rash, itching or hives, swelling of the face, lips, or tongue -changes in blood sugar -changes in heart rate -severe stomach pain Side effects that usually do not require medical attention (report to your doctor or health care professional if they continue or are bothersome): -diarrhea or constipation -gas or stomach pain -nausea, vomiting -pain, redness, swelling and irritation at site where injected This list may not describe all possible side effects. Call your doctor for medical advice about side effects. You may report side effects to FDA at 1-800-FDA-1088. Where should I keep my medicine? Keep out of the reach of children. Store in a refrigerator between 2 and 8 degrees C (36 and 46 degrees F). Protect from light. Allow to come to room temperature naturally. Do not use artificial heat. If protected from light, the injection may be stored at room temperature between 20 and 30 degrees C (70 and 86 degrees F) for 14 days. After the initial use, throw away any unused  portion of a multiple dose vial after 14 days. Throw away unused portions of the ampules after use. NOTE: This sheet is a summary. It may not cover all possible information. If you have questions about this medicine, talk to your doctor, pharmacist, or health care provider.    2016, [employer]. (2008-02-16 16:56:04)

## 2016-05-29 ENCOUNTER — Other Ambulatory Visit: Payer: Self-pay | Admitting: Internal Medicine

## 2016-06-21 ENCOUNTER — Ambulatory Visit: Payer: BLUE CROSS/BLUE SHIELD

## 2016-06-21 ENCOUNTER — Telehealth: Payer: Self-pay | Admitting: Oncology

## 2016-06-21 ENCOUNTER — Ambulatory Visit: Payer: BLUE CROSS/BLUE SHIELD | Admitting: Oncology

## 2016-06-21 NOTE — Telephone Encounter (Signed)
Appointments scheduled per patient request. Patient missed appointment for 11/17 came to office to reschedule to next available.

## 2016-06-26 ENCOUNTER — Other Ambulatory Visit: Payer: Self-pay | Admitting: Internal Medicine

## 2016-07-02 ENCOUNTER — Ambulatory Visit (HOSPITAL_BASED_OUTPATIENT_CLINIC_OR_DEPARTMENT_OTHER): Payer: BLUE CROSS/BLUE SHIELD | Admitting: Oncology

## 2016-07-02 ENCOUNTER — Ambulatory Visit: Payer: BLUE CROSS/BLUE SHIELD

## 2016-07-02 ENCOUNTER — Telehealth: Payer: Self-pay | Admitting: Oncology

## 2016-07-02 VITALS — BP 146/80 | HR 80 | Temp 98.4°F | Resp 17 | Ht 70.0 in | Wt 187.1 lb

## 2016-07-02 DIAGNOSIS — R0609 Other forms of dyspnea: Secondary | ICD-10-CM | POA: Diagnosis not present

## 2016-07-02 DIAGNOSIS — C7B8 Other secondary neuroendocrine tumors: Secondary | ICD-10-CM

## 2016-07-02 DIAGNOSIS — C7A8 Other malignant neuroendocrine tumors: Secondary | ICD-10-CM | POA: Diagnosis not present

## 2016-07-02 DIAGNOSIS — D3A8 Other benign neuroendocrine tumors: Secondary | ICD-10-CM

## 2016-07-02 MED ORDER — OCTREOTIDE ACETATE 30 MG IM KIT
30.0000 mg | PACK | Freq: Once | INTRAMUSCULAR | Status: AC
  Administered 2016-07-02: 30 mg via INTRAMUSCULAR
  Filled 2016-07-02: qty 1

## 2016-07-02 NOTE — Patient Instructions (Addendum)
Influenza Virus Vaccine (Flucelvax) What is this medicine? INFLUENZA VIRUS VACCINE (in floo EN zuh VAHY ruhs vak SEEN) helps to reduce the risk of getting influenza also known as the flu. The vaccine only helps protect you against some strains of the flu. This medicine may be used for other purposes; ask your health care provider or pharmacist if you have questions. What should I tell my health care provider before I take this medicine? They need to know if you have any of these conditions: -bleeding disorder like hemophilia -fever or infection -Guillain-Barre syndrome or other neurological problems -immune system problems -infection with the human immunodeficiency virus (HIV) or AIDS -low blood platelet counts -multiple sclerosis -an unusual or allergic reaction to influenza virus vaccine, other medicines, foods, dyes or preservatives -pregnant or trying to get pregnant -breast-feeding How should I use this medicine? This vaccine is for injection into a muscle. It is given by a health care professional. A copy of Vaccine Information Statements will be given before each vaccination. Read this sheet carefully each time. The sheet may change frequently. Talk to your pediatrician regarding the use of this medicine in children. Special care may be needed. Overdosage: If you think you've taken too much of this medicine contact a poison control center or emergency room at once. Overdosage: If you think you have taken too much of this medicine contact a poison control center or emergency room at once. NOTE: This medicine is only for you. Do not share this medicine with others. What if I miss a dose? This does not apply. What may interact with this medicine? -chemotherapy or radiation therapy -medicines that lower your immune system like etanercept, anakinra, infliximab, and adalimumab -medicines that treat or prevent blood clots like warfarin -phenytoin -steroid medicines like prednisone or  cortisone -theophylline -vaccines This list may not describe all possible interactions. Give your health care provider a list of all the medicines, herbs, non-prescription drugs, or dietary supplements you use. Also tell them if you smoke, drink alcohol, or use illegal drugs. Some items may interact with your medicine. What should I watch for while using this medicine? Report any side effects that do not go away within 3 days to your doctor or health care professional. Call your health care provider if any unusual symptoms occur within 6 weeks of receiving this vaccine. You may still catch the flu, but the illness is not usually as bad. You cannot get the flu from the vaccine. The vaccine will not protect against colds or other illnesses that may cause fever. The vaccine is needed every year. What side effects may I notice from receiving this medicine? Side effects that you should report to your doctor or health care professional as soon as possible: -allergic reactions like skin rash, itching or hives, swelling of the face, lips, or tongue Side effects that usually do not require medical attention (Report these to your doctor or health care professional if they continue or are bothersome.): -fever -headache -muscle aches and pains -pain, tenderness, redness, or swelling at the injection site -tiredness This list may not describe all possible side effects. Call your doctor for medical advice about side effects. You may report side effects to FDA at 1-800-FDA-1088. Where should I keep my medicine? The vaccine will be given by a health care professional in a clinic, pharmacy, doctor's office, or other health care setting. You will not be given vaccine doses to store at home. NOTE: This sheet is a summary. It may not cover   all possible information. If you have questions about this medicine, talk to your doctor, pharmacist, or health care provider.    2016, Elsevier/Gold Standard. (2011-07-03  14:06:47) Octreotide injection solution What is this medicine? OCTREOTIDE (ok TREE oh tide) is used to reduce blood levels of growth hormone in patients with a condition called acromegaly. This medicine also reduces flushing and watery diarrhea caused by certain types of cancer. This medicine may be used for other purposes; ask your health care provider or pharmacist if you have questions. What should I tell my health care provider before I take this medicine? They need to know if you have any of these conditions: -gallbladder disease -kidney disease -liver disease -an unusual or allergic reaction to octreotide, other medicines, foods, dyes, or preservatives -pregnant or trying to get pregnant -breast-feeding How should I use this medicine? This medicine is for injection under the skin or into a vein (only in emergency situations). It is usually given by a health care professional in a hospital or clinic setting. If you get this medicine at home, you will be taught how to prepare and give this medicine. Allow the injection solution to come to room temperature before use. Do not warm it artificially. Use exactly as directed. Take your medicine at regular intervals. Do not take your medicine more often than directed. It is important that you put your used needles and syringes in a special sharps container. Do not put them in a trash can. If you do not have a sharps container, call your pharmacist or healthcare provider to get one. Talk to your pediatrician regarding the use of this medicine in children. Special care may be needed. Overdosage: If you think you have taken too much of this medicine contact a poison control center or emergency room at once. NOTE: This medicine is only for you. Do not share this medicine with others. What if I miss a dose? If you miss a dose, take it as soon as you can. If it is almost time for your next dose, take only that dose. Do not take double or extra  doses. What may interact with this medicine? Do not take this medicine with any of the following medications: -cisapride -droperidol -general anesthetics -grepafloxacin -perphenazine -thioridazine This medicine may also interact with the following medications: -bromocriptine -cyclosporine -diuretics -medicines for blood pressure, heart disease, irregular heart beat -medicines for diabetes, including insulin -quinidine This list may not describe all possible interactions. Give your health care provider a list of all the medicines, herbs, non-prescription drugs, or dietary supplements you use. Also tell them if you smoke, drink alcohol, or use illegal drugs. Some items may interact with your medicine. What should I watch for while using this medicine? Visit your doctor or health care professional for regular checks on your progress. To help reduce irritation at the injection site, use a different site for each injection and make sure the solution is at room temperature before use. This medicine may cause increases or decreases in blood sugar. Signs of high blood sugar include frequent urination, unusual thirst, flushed or dry skin, difficulty breathing, drowsiness, stomach ache, nausea, vomiting or dry mouth. Signs of low blood sugar include chills, cool, pale skin or cold sweats, drowsiness, extreme hunger, fast heartbeat, headache, nausea, nervousness or anxiety, shakiness, trembling, unsteadiness, tiredness, or weakness. Contact your doctor or health care professional right away if you experience any of these symptoms. What side effects may I notice from receiving this medicine? Side effects that you  should report to your doctor or health care professional as soon as possible: -allergic reactions like skin rash, itching or hives, swelling of the face, lips, or tongue -changes in blood sugar -changes in heart rate -severe stomach pain Side effects that usually do not require medical  attention (report to your doctor or health care professional if they continue or are bothersome): -diarrhea or constipation -gas or stomach pain -nausea, vomiting -pain, redness, swelling and irritation at site where injected This list may not describe all possible side effects. Call your doctor for medical advice about side effects. You may report side effects to FDA at 1-800-FDA-1088. Where should I keep my medicine? Keep out of the reach of children. Store in a refrigerator between 2 and 8 degrees C (36 and 46 degrees F). Protect from light. Allow to come to room temperature naturally. Do not use artificial heat. If protected from light, the injection may be stored at room temperature between 20 and 30 degrees C (70 and 86 degrees F) for 14 days. After the initial use, throw away any unused portion of a multiple dose vial after 14 days. Throw away unused portions of the ampules after use. NOTE: This sheet is a summary. It may not cover all possible information. If you have questions about this medicine, talk to your doctor, pharmacist, or health care provider.    2016, Elsevier/Gold Standard. (2008-02-16 16:56:04) Octreotide injection solution What is this medicine? OCTREOTIDE (ok TREE oh tide) is used to reduce blood levels of growth hormone in patients with a condition called acromegaly. This medicine also reduces flushing and watery diarrhea caused by certain types of cancer. This medicine may be used for other purposes; ask your health care provider or pharmacist if you have questions. What should I tell my health care provider before I take this medicine? They need to know if you have any of these conditions: -gallbladder disease -kidney disease -liver disease -an unusual or allergic reaction to octreotide, other medicines, foods, dyes, or preservatives -pregnant or trying to get pregnant -breast-feeding How should I use this medicine? This medicine is for injection under the skin  or into a vein (only in emergency situations). It is usually given by a health care professional in a hospital or clinic setting. If you get this medicine at home, you will be taught how to prepare and give this medicine. Allow the injection solution to come to room temperature before use. Do not warm it artificially. Use exactly as directed. Take your medicine at regular intervals. Do not take your medicine more often than directed. It is important that you put your used needles and syringes in a special sharps container. Do not put them in a trash can. If you do not have a sharps container, call your pharmacist or healthcare provider to get one. Talk to your pediatrician regarding the use of this medicine in children. Special care may be needed. Overdosage: If you think you have taken too much of this medicine contact a poison control center or emergency room at once. NOTE: This medicine is only for you. Do not share this medicine with others. What if I miss a dose? If you miss a dose, take it as soon as you can. If it is almost time for your next

## 2016-07-02 NOTE — Telephone Encounter (Signed)
Gave patient avs report and appointments for December thru February.  °

## 2016-07-02 NOTE — Progress Notes (Signed)
  [facility] OFFICE PROGRESS NOTE   Diagnosis: Pancreatic neuroendocrine tumor  INTERVAL HISTORY:   Ronald Thompson returns as scheduled. He continues monthly Sandostatin. No diarrhea or flushing. Stable exertional dyspnea. Good appetite. He saw Dr. Leamon Arnt last month. He plans to enroll on a study with lanreotide and pembrolizumab at [facility]. His insurance would not pay for a gallium PET scan. He plans to schedule the PET scan 38forafter he becomes Medicare eligible in late January.  Objective:  Vital signs in last 24 hours:  Blood pressure (!) 146/80, pulse 80, temperature 98.4 F (36.9 C), temperature source Oral, resp. rate 17, height 5\' 10"  (1.778 m), weight 187 lb 1.6 oz (84.9 kg), SpO2 96 %.    HEENT: Neck without mass Lymphatics: No cervical or supraclavicular nodes Resp: Decreased breath sounds at the left compared to the right chest, no respiratory distress Cardio: Regular rate and rhythm GI: No hepatomegaly, nontender Vascular: No leg edema    Medications: I have reviewed the patient's current medications.  Assessment/Plan: 1. Pancreatic neuroendocrine tumor, WHO grade 2, pancreatic head mass and small peripancreatic/celiac nodes on a CT 02/04/2014  EUS revealed evidence of multiple liver metastases-status post an FNA biopsy of a left liver lesion 02/10/2014 confirming a neuroendocrine tumor   Octreotide scan 04/01/2014 with multiple foci of metastatic neuroendocrine tumor in the liver  Monthly Sandostatin started 03/16/2014  Restaging CT 07/04/2014 with resolution of a previously noted pancreas head lesion and probable progression of liver metastases  Restaging CT 10/31/2014-stable liver lesions, no evidence of a pancreas mass, stable.  Restaging CT 02/28/2015-stable hepatic metastases, stable pancreas lesion unchanged  Restaging CT 08/30/2015-stable pancreas mass, slight enlargement of hepatic metastases  Monthly Sandostatin continued  Restaging  CTs06/14/2017 revealed enlargement of liver lesions, no new lesions, enlargement of the pancreas head mass  2. 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 chronic exertional dyspnea following treatment for the seminoma  5. aortic stenosis on an echocardiogram December 2011, severe aortic stenosis on echocardiogram 12/06/2015, status post TAVR on 04/16/2016 6. lumbar disc surgery 2014  7. acute abdominal pain 02/04/2014-resolved     Disposition:  Ronald Thompson appears stable. He would like to enroll on the pembrolizumab/lanreotide study at [facility]. He plans to have a gallium DOTATATE scan when he gets Medicare in late January. We will ask him to contact Dr. Leamon Arnt to see if he is eligible for the clinical trial while awaiting approval of the Lu-177 DOTATATE.  He will continue monthly Sandostatin. Betsy Coder, MD  07/02/2016  3:47 PM

## 2016-07-09 ENCOUNTER — Telehealth: Payer: Self-pay | Admitting: *Deleted

## 2016-07-09 NOTE — Telephone Encounter (Signed)
FYI "I need to know the name of the injection I receive every month and the dose.  Currently 60 yrs old, eligible for Medicare and the Cobra I have expires 09-04-2016."  Provided Octreotide (Sandostatin) 30 mg every 4 weeks or 28 days.  "This has cost [phone] for every injection I received."  I'm also arranging second insurance which is a [employer] policy to cover what Medicare does not cover."

## 2016-07-10 ENCOUNTER — Encounter: Payer: Self-pay | Admitting: Oncology

## 2016-07-10 NOTE — Progress Notes (Signed)
Rcvd email from Gaspar Bidding stating pt had billing questions so I called pt and he informed me that the triage nurse took care of it on 07/09/16.  He had a question regarding a certain medication he was taking so I gave him the number for Dr. Gearldine Shown nurse since it was a clinical question.

## 2016-07-22 ENCOUNTER — Ambulatory Visit (HOSPITAL_BASED_OUTPATIENT_CLINIC_OR_DEPARTMENT_OTHER): Payer: BLUE CROSS/BLUE SHIELD

## 2016-07-22 ENCOUNTER — Other Ambulatory Visit: Payer: Self-pay | Admitting: Nurse Practitioner

## 2016-07-22 ENCOUNTER — Ambulatory Visit (HOSPITAL_BASED_OUTPATIENT_CLINIC_OR_DEPARTMENT_OTHER): Payer: BLUE CROSS/BLUE SHIELD | Admitting: Nurse Practitioner

## 2016-07-22 ENCOUNTER — Telehealth: Payer: Self-pay | Admitting: *Deleted

## 2016-07-22 ENCOUNTER — Other Ambulatory Visit (HOSPITAL_COMMUNITY)
Admission: RE | Admit: 2016-07-22 | Discharge: 2016-07-22 | Disposition: A | Discharge status: Home / Self Care | Payer: BLUE CROSS/BLUE SHIELD | Source: Ambulatory Visit | Attending: Nurse Practitioner | Admitting: Nurse Practitioner

## 2016-07-22 VITALS — BP 118/75 | HR 74 | Temp 97.8°F | Resp 18 | Ht 70.0 in | Wt 185.6 lb

## 2016-07-22 DIAGNOSIS — D3A8 Other benign neuroendocrine tumors: Secondary | ICD-10-CM

## 2016-07-22 DIAGNOSIS — C7A8 Other malignant neuroendocrine tumors: Secondary | ICD-10-CM | POA: Diagnosis not present

## 2016-07-22 DIAGNOSIS — K5900 Constipation, unspecified: Secondary | ICD-10-CM

## 2016-07-22 DIAGNOSIS — C7B8 Other secondary neuroendocrine tumors: Secondary | ICD-10-CM | POA: Diagnosis not present

## 2016-07-22 DIAGNOSIS — M25512 Pain in left shoulder: Secondary | ICD-10-CM

## 2016-07-22 LAB — CBC WITH DIFFERENTIAL/PLATELET
BASO%: 0.4 % (ref 0.0–2.0)
Basophils Absolute: 0 10*3/uL (ref 0.0–0.1)
EOS%: 1.1 % (ref 0.0–7.0)
Eosinophils Absolute: 0.1 10*3/uL (ref 0.0–0.5)
HCT: 47 % (ref 38.4–49.9)
HGB: 15.5 g/dL (ref 13.0–17.1)
LYMPH%: 10.4 % — ABNORMAL LOW (ref 14.0–49.0)
MCH: 31.1 pg (ref 27.2–33.4)
MCHC: 32.9 g/dL (ref 32.0–36.0)
MCV: 94.6 fL (ref 79.3–98.0)
MONO#: 0.8 10*3/uL (ref 0.1–0.9)
MONO%: 10.4 % (ref 0.0–14.0)
NEUT#: 5.7 10*3/uL (ref 1.5–6.5)
NEUT%: 77.7 % — ABNORMAL HIGH (ref 39.0–75.0)
Platelets: 182 10*3/uL (ref 140–400)
RBC: 4.97 10*6/uL (ref 4.20–5.82)
RDW: 13.3 % (ref 11.0–14.6)
WBC: 7.3 10*3/uL (ref 4.0–10.3)
lymph#: 0.8 10*3/uL — ABNORMAL LOW (ref 0.9–3.3)

## 2016-07-22 LAB — COMPREHENSIVE METABOLIC PANEL
ALT: 24 U/L (ref 0–55)
AST: 25 U/L (ref 5–34)
Albumin: 4 g/dL (ref 3.5–5.0)
Alkaline Phosphatase: 81 U/L (ref 40–150)
Anion Gap: 8 mEq/L (ref 3–11)
BUN: 21.3 mg/dL (ref 7.0–26.0)
CO2: 29 mEq/L (ref 22–29)
Calcium: 9.5 mg/dL (ref 8.4–10.4)
Chloride: 103 mEq/L (ref 98–109)
Creatinine: 1 mg/dL (ref 0.7–1.3)
EGFR: 78 mL/min/{1.73_m2} — ABNORMAL LOW (ref >90)
Glucose: 119 mg/dl (ref 70–140)
Potassium: 4.7 mEq/L (ref 3.5–5.1)
Sodium: 139 mEq/L (ref 136–145)
Total Bilirubin: 1.27 mg/dL — ABNORMAL HIGH (ref 0.20–1.20)
Total Protein: 7.2 g/dL (ref 6.4–8.3)

## 2016-07-22 LAB — LIPASE, BLOOD: LIPASE: 31 U/L (ref 11–51)

## 2016-07-22 LAB — AMYLASE: Amylase: 28 U/L (ref 28–100)

## 2016-07-22 MED ORDER — OXYCODONE HCL 5 MG PO TABS: ORAL_TABLET | ORAL | 0 refills | Status: DC

## 2016-07-22 NOTE — Telephone Encounter (Signed)
Call placed to patient to inform him to come to [facility] for [facility] appointment.  Patient appreciative of call back.  LOS and lab orders entered by C. Berniece Salines NP.

## 2016-07-22 NOTE — Telephone Encounter (Signed)
Message received from patient stating that he had an "abdominal attack of pain" over the weekend which was relieved with Oxycodone and that he would like to be seen today by a provider.  Will discuss with L. Thomas NP and return patients call.

## 2016-07-23 ENCOUNTER — Encounter: Payer: Self-pay | Admitting: Nurse Practitioner

## 2016-07-23 DIAGNOSIS — K5900 Constipation, unspecified: Secondary | ICD-10-CM | POA: Insufficient documentation

## 2016-07-23 DIAGNOSIS — M25512 Pain in left shoulder: Secondary | ICD-10-CM | POA: Insufficient documentation

## 2016-07-23 NOTE — Assessment & Plan Note (Addendum)
Patient states that he has a history of chronic left shoulder pain due to her previous diagnosis of cancer with both chemotherapy and radiation treatments to the left shoulder in the past.  Patient states that he developed some increased left shoulder discomfort that radiated down the site of his left chest and then radiated across his stomach within the past 24 hours.  He states that the pain completely resolved; and he has had no further pain since that time.  He states that he suffers with chronic shortness of breath secondary to his previously diagnosed pulmonary fibrosis as well.  He denies any chest pain, chest pressure, increase shortness of breath, or pain with inspiration.  He denies any URI symptoms.  He also denies any recent fevers or chills.  Exam today revealed breath sounds clear bilaterally with no acute respiratory issues whatsoever.  Heart rhythm was regular.  Also, patient was observed with full range of motion of his left shoulder; and in no acute distress.  See further notes for details of today's visit.  Reviewed all findings with Dr. Irene Limbo on-call physician; and he advised that patient should go directly to the emergency department for any worsening symptoms whatsoever.  Since patient is pain free at this moment and has no other symptoms whatsoever.-Patient was given a prescription for oxycodone to take on an as-needed basis.  He was strongly encouraged to go directly to the emergency department if there is any concern whatsoever regarding any possible cardiac issues.  Patient stated understanding of all instructions; and was in agreement with this plan of care.    Also-patient was advised to call/return or go directly to the emergency department for any worsening symptoms whatsoever.  Patient is scheduled for restaging scan in February 2018.  If patient's symptoms returned-may want to consider moving of patients restaging scan.

## 2016-07-23 NOTE — Assessment & Plan Note (Signed)
Patient continues to receive monthly Sandostatin injections.  Patient is scheduled to return for his next and a statin injection on 07/30/2016.

## 2016-07-23 NOTE — Progress Notes (Signed)
SYMPTOM MANAGEMENT CLINIC    Chief Complaint: Left shoulder pain  HPI:  Ronald Thompson 60 y.o. male diagnosed with pancreatic neuroendocrine tumor.  Currently undergoing Sandostatin injections.    No history exists.    Review of Systems  Musculoskeletal: Positive for joint pain.  All other systems reviewed and are negative.   Past Medical History:  Diagnosis Date  . Baker's cyst   . Gallstones   . Heart murmur   . Hypercholesteremia   . Hypertension   . Lesion of left lung    hx.25 yrs ago- testicilar cancer related- only scarring left after tx.-no problems now  . Liver metastasis ([place])   . 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  . Pericarditis    2nd to tumor  . Pneumonia 1990  . Primary neuroendocrine tumor of pancreas 02/14/2014   Stage IV with multiple mets to liver  . Pulmonary fibrosis ([place]) 11/08/2015  . S/P TAVR (transcatheter aortic valve replacement) 04/16/2016   26 mm Edwards Sapien 3 transcatheter heart valve placed via percutaneous right transfemoral approach   . Shortness of breath dyspnea    with exertion and when tired  . 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 Transfusion history    hx. 25 yrs ago-during cancer tx.    Past Surgical History:  Procedure Laterality Date  . BACK SURGERY     '14- rupt. disc   . CARDIAC CATHETERIZATION N/A 01/24/2016   Procedure: Right/Left Heart Cath and Coronary Angiography;  Surgeon: [name], MD;  Location: [facility];  Service: Cardiovascular;  Laterality: N/A;  . COLONOSCOPY    . EUS N/A 02/10/2014   Procedure: UPPER ENDOSCOPIC ULTRASOUND (EUS) LINEAR;  Surgeon: [name], MD;  Location: [facility];  Service: Endoscopy;  Laterality: N/A;  . KNEE SURGERY Right    age 77 for Baker's cyst  . NASAL SEPTUM SURGERY     Deviated septrum  . TEE  WITHOUT CARDIOVERSION N/A 04/16/2016   Procedure: TRANSESOPHAGEAL ECHOCARDIOGRAM (TEE);  Surgeon: [name], MD;  Location: [facility];  Service: Open Heart Surgery;  Laterality: N/A;  . THORACOTOMY  1990   wedge biopsy of mediastinal mass  . TRANSCATHETER AORTIC VALVE REPLACEMENT, TRANSFEMORAL N/A 04/16/2016   Procedure: TRANSCATHETER AORTIC VALVE REPLACEMENT, TRANSFEMORAL;  Surgeon: [name], MD;  Location: [facility];  Service: Open Heart Surgery;  Laterality: N/A;    has Hyperlipidemia; BAKER'S CYST; HEART MURMUR, SYSTOLIC; PREHYPERTENSION; Encounter for preventative adult health care exam with abnormal findings; Left lumbar radiculopathy; Aortic stenosis; History of shingles; Abdominal pain, unspecified site; Dehydration; Primary neuroendocrine tumor of pancreas; Olecranon bursitis; Essential tremor; Erectile dysfunction; Fatigue; Dyspnea; Pulmonary fibrosis ([place]); Nocturia; Snoring; Deviated nasal septum; Bilateral hearing loss; S/P TAVR (transcatheter aortic valve replacement); Constipation; and Shoulder pain, left on his problem list.    is allergic to penicillins.  Allergies as of 07/22/2016      Reactions   Penicillins Hives   ENTIRE BODY Has patient had a PCN reaction causing immediate rash, facial/tongue/throat swelling, SOB or lightheadedness with hypotension: No Has patient had a PCN reaction causing severe rash involving mucus membranes or skin necrosis: No Has patient had a PCN reaction that required hospitalization * *  YES  * * Has patient had a PCN reaction occurring within the last 10 years: No If all of the above  answers are "NO", then may proceed with Cephalosporin use.      Medication List       Accurate as of 07/22/16 11:59 PM. Always use your most recent med list.          acetaminophen 500 MG tablet Commonly known as:  TYLENOL Take 1,000 mg by mouth every 6 (six) hours as needed for headache.   aspirin EC 81 MG tablet Take 81 mg by mouth every evening.     calcium carbonate 500 MG chewable tablet Commonly known as:  TUMS - dosed in mg elemental calcium Chew 3 tablets by mouth 3 (three) times daily as needed for indigestion or heartburn.   clindamycin 300 MG capsule Commonly known as:  CLEOCIN Take 2 capsules by mouth one hour prior to dental appointment   clopidogrel 75 MG tablet Commonly known as:  PLAVIX Take 1 tablet (75 mg total) by mouth daily with breakfast.   docusate sodium 100 MG capsule Commonly known as:  COLACE Take 100 mg by mouth daily as needed for mild constipation.   ibuprofen 200 MG tablet Commonly known as:  ADVIL,MOTRIN Take 400 mg by mouth every 6 (six) hours as needed for headache or mild pain.   losartan 50 MG tablet Commonly known as:  COZAAR TAKE ONE TABLET BY MOUTH DAILY   metoprolol tartrate 25 MG tablet Commonly known as:  LOPRESSOR Take 0.5 tablets (12.5 mg total) by mouth 2 (two) times daily.   oxyCODONE 5 MG immediate release tablet Commonly known as:  Oxy IR/ROXICODONE 1-2 tabs PO Q 6 hours PRN pain.   SANDOSTATIN IJ Inject 1 each as directed every 30 (thirty) days.   sildenafil 20 MG tablet Commonly known as:  REVATIO Take 20 mg by mouth daily as needed (for ED).   simvastatin 20 MG tablet Commonly known as:  ZOCOR TAKE 1 TABLET (20 MG TOTAL) BY MOUTH AT BEDTIME.   traMADol 50 MG tablet Commonly known as:  ULTRAM Take 1 tablet (50 mg total) by mouth every 4 (four) hours as needed for moderate pain.        PHYSICAL EXAMINATION  Oncology Vitals 07/22/2016 07/02/2016  Height 178 cm 178 cm  Weight 84.188 kg 84.868 kg  Weight (lbs) 185 lbs 10 oz 187 lbs 2 oz  BMI (kg/m2) 26.63 kg/m2 26.85 kg/m2  Temp 97.8 98.4  Pulse 74 80  Resp 18 17  SpO2 95 96  BSA (m2) 2.04 m2 2.05 m2   BP Readings from Last 2 Encounters:  07/22/16 118/75  07/02/16 (!) 146/80    Physical Exam  Constitutional: He is oriented to person, place, and time and well-developed, well-nourished, and in no  distress.  HENT:  Head: Normocephalic and atraumatic.  Mouth/Throat: Oropharynx is clear and moist.  Eyes: Conjunctivae and EOM are normal. Pupils are equal, round, and reactive to light. Right eye exhibits no discharge. Left eye exhibits no discharge. No scleral icterus.  Neck: Normal range of motion. Neck supple. No JVD present. No tracheal deviation present. No thyromegaly present.  Cardiovascular: Normal rate, regular rhythm, normal heart sounds and intact distal pulses.   Pulmonary/Chest: Effort normal and breath sounds normal. No respiratory distress. He has no wheezes. He has no rales. He exhibits no tenderness.  Abdominal: Soft. Bowel sounds are normal. He exhibits no distension and no mass. There is no tenderness. There is no rebound and no guarding.  Musculoskeletal: Normal range of motion. He exhibits no edema, tenderness or deformity.  Lymphadenopathy:  He has no cervical adenopathy.  Neurological: He is alert and oriented to person, place, and time. Gait normal.  Skin: Skin is warm and dry. No rash noted. No erythema. No pallor.  Psychiatric: Affect normal.  Nursing note and vitals reviewed.   LABORATORY DATA:. Hospital Outpatient Visit on 07/22/2016  Component Date Value Ref Range Status  . Amylase 07/22/2016 28  28 - 100 U/L Final  . Lipase 07/22/2016 31  11 - 51 U/L Final  Appointment on 07/22/2016  Component Date Value Ref Range Status  . WBC 07/22/2016 7.3  4.0 - 10.3 10e3/uL Final  . NEUT# 07/22/2016 5.7  1.5 - 6.5 10e3/uL Final  . HGB 07/22/2016 15.5  13.0 - 17.1 g/dL Final  . HCT 07/22/2016 47.0  38.4 - 49.9 % Final  . Platelets 07/22/2016 182  140 - 400 10e3/uL Final  . MCV 07/22/2016 94.6  79.3 - 98.0 fL Final  . MCH 07/22/2016 31.1  27.2 - 33.4 pg Final  . MCHC 07/22/2016 32.9  32.0 - 36.0 g/dL Final  . RBC 07/22/2016 4.97  4.20 - 5.82 10e6/uL Final  . RDW 07/22/2016 13.3  11.0 - 14.6 % Final  . lymph# 07/22/2016 0.8* 0.9 - 3.3 10e3/uL Final  . MONO#  07/22/2016 0.8  0.1 - 0.9 10e3/uL Final  . Eosinophils Absolute 07/22/2016 0.1  0.0 - 0.5 10e3/uL Final  . Basophils Absolute 07/22/2016 0.0  0.0 - 0.1 10e3/uL Final  . NEUT% 07/22/2016 77.7* 39.0 - 75.0 % Final  . LYMPH% 07/22/2016 10.4* 14.0 - 49.0 % Final  . MONO% 07/22/2016 10.4  0.0 - 14.0 % Final  . EOS% 07/22/2016 1.1  0.0 - 7.0 % Final  . BASO% 07/22/2016 0.4  0.0 - 2.0 % Final  . Sodium 07/22/2016 139  136 - 145 mEq/L Final  . Potassium 07/22/2016 4.7  3.5 - 5.1 mEq/L Final  . Chloride 07/22/2016 103  98 - 109 mEq/L Final  . CO2 07/22/2016 29  22 - 29 mEq/L Final  . Glucose 07/22/2016 119  70 - 140 mg/dl Final  . BUN 07/22/2016 21.3  7.0 - 26.0 mg/dL Final  . Creatinine 07/22/2016 1.0  0.7 - 1.3 mg/dL Final  . Total Bilirubin 07/22/2016 1.27* 0.20 - 1.20 mg/dL Final  . Alkaline Phosphatase 07/22/2016 81  40 - 150 U/L Final  . AST 07/22/2016 25  5 - 34 U/L Final  . ALT 07/22/2016 24  0 - 55 U/L Final  . Total Protein 07/22/2016 7.2  6.4 - 8.3 g/dL Final  . Albumin 07/22/2016 4.0  3.5 - 5.0 g/dL Final  . Calcium 07/22/2016 9.5  8.4 - 10.4 mg/dL Final  . Anion Gap 07/22/2016 8  3 - 11 mEq/L Final  . EGFR 07/22/2016 78* >90 ml/min/1.73 m2 Final    RADIOGRAPHIC STUDIES: No results found.  ASSESSMENT/PLAN:    Shoulder pain, left Patient states that he has a history of chronic left shoulder pain due to her previous diagnosis of cancer with both chemotherapy and radiation treatments to the left shoulder in the past.  Patient states that he developed some increased left shoulder discomfort that radiated down the site of his left chest and then radiated across his stomach within the past 24 hours.  He states that the pain completely resolved; and he has had no further pain since that time.  He states that he suffers with chronic shortness of breath secondary to his previously diagnosed pulmonary fibrosis as well.  He denies any chest  pain, chest pressure, increase shortness of breath,  or pain with inspiration.  He denies any URI symptoms.  He also denies any recent fevers or chills.  Exam today revealed breath sounds clear bilaterally with no acute respiratory issues whatsoever.  Heart rhythm was regular.  Also, patient was observed with full range of motion of his left shoulder; and in no acute distress.  See further notes for details of today's visit.  Reviewed all findings with Dr. Irene Limbo on-call physician; and he advised that patient should go directly to the emergency department for any worsening symptoms whatsoever.  Since patient is pain free at this moment and has no other symptoms whatsoever.-Patient was given a prescription for oxycodone to take on an as-needed basis.  He was strongly encouraged to go directly to the emergency department if there is any concern whatsoever regarding any possible cardiac issues.  Patient stated understanding of all instructions; and was in agreement with this plan of care.    Also-patient was advised to call/return or go directly to the emergency department for any worsening symptoms whatsoever.  Patient is scheduled for restaging scan in February 2018.  If patient's symptoms returned-may want to consider moving of patients restaging scan.  Constipation Patient states that he has been constipated for the last few days.  He was advised to try taking the over-the-counter senna-S for treatment of constipation.  Primary neuroendocrine tumor of pancreas Patient continues to receive monthly Sandostatin injections.  Patient is scheduled to return for his next and a statin injection on 07/30/2016.   Patient stated understanding of all instructions; and was in agreement with this plan of care. The patient knows to call the clinic with any problems, questions or concerns.   Total time spent with patient was 25 minutes;  with greater than 75 percent of that time spent in face to face counseling regarding patient's symptoms,  and coordination of  care and follow up.  Disclaimer:This dictation was prepared with Dragon/digital dictation along with [employer]. Any transcriptional errors that result from this process are unintentional.  Drue Second, NP 07/23/2016

## 2016-07-23 NOTE — Assessment & Plan Note (Signed)
Patient states that he has been constipated for the last few days.  He was advised to try taking the over-the-counter senna-S for treatment of constipation.

## 2016-07-25 ENCOUNTER — Telehealth: Payer: Self-pay | Admitting: *Deleted

## 2016-07-26 ENCOUNTER — Telehealth: Payer: Self-pay | Admitting: Oncology

## 2016-07-26 NOTE — Telephone Encounter (Signed)
Patient rescheduled injection from 07/30/16 to 07/31/16. 07/26/16

## 2016-07-30 ENCOUNTER — Ambulatory Visit: Payer: BLUE CROSS/BLUE SHIELD

## 2016-07-31 ENCOUNTER — Ambulatory Visit (HOSPITAL_BASED_OUTPATIENT_CLINIC_OR_DEPARTMENT_OTHER): Payer: BLUE CROSS/BLUE SHIELD

## 2016-07-31 ENCOUNTER — Telehealth: Payer: Self-pay | Admitting: Oncology

## 2016-07-31 VITALS — BP 144/71 | HR 81 | Temp 97.3°F | Resp 20

## 2016-07-31 DIAGNOSIS — C7B8 Other secondary neuroendocrine tumors: Secondary | ICD-10-CM | POA: Diagnosis not present

## 2016-07-31 DIAGNOSIS — C7A8 Other malignant neuroendocrine tumors: Secondary | ICD-10-CM | POA: Diagnosis not present

## 2016-07-31 DIAGNOSIS — D3A8 Other benign neuroendocrine tumors: Secondary | ICD-10-CM

## 2016-07-31 MED ORDER — OCTREOTIDE ACETATE 30 MG IM KIT
30.0000 mg | PACK | Freq: Once | INTRAMUSCULAR | Status: AC
  Administered 2016-07-31: 30 mg via INTRAMUSCULAR
  Filled 2016-07-31: qty 1

## 2016-07-31 NOTE — Patient Instructions (Signed)
Influenza Virus Vaccine (Flucelvax) What is this medicine? INFLUENZA VIRUS VACCINE (in floo EN zuh VAHY ruhs vak SEEN) helps to reduce the risk of getting influenza also known as the flu. The vaccine only helps protect you against some strains of the flu. This medicine may be used for other purposes; ask your health care provider or pharmacist if you have questions. What should I tell my health care provider before I take this medicine? They need to know if you have any of these conditions: -bleeding disorder like hemophilia -fever or infection -Guillain-Barre syndrome or other neurological problems -immune system problems -infection with the human immunodeficiency virus (HIV) or AIDS -low blood platelet counts -multiple sclerosis -an unusual or allergic reaction to influenza virus vaccine, other medicines, foods, dyes or preservatives -pregnant or trying to get pregnant -breast-feeding How should I use this medicine? This vaccine is for injection into a muscle. It is given by a health care professional. A copy of Vaccine Information Statements will be given before each vaccination. Read this sheet carefully each time. The sheet may change frequently. Talk to your pediatrician regarding the use of this medicine in children. Special care may be needed. Overdosage: If you think you've taken too much of this medicine contact a poison control center or emergency room at once. Overdosage: If you think you have taken too much of this medicine contact a poison control center or emergency room at once. NOTE: This medicine is only for you. Do not share this medicine with others. What if I miss a dose? This does not apply. What may interact with this medicine? -chemotherapy or radiation therapy -medicines that lower your immune system like etanercept, anakinra, infliximab, and adalimumab -medicines that treat or prevent blood clots like warfarin -phenytoin -steroid medicines like prednisone or  cortisone -theophylline -vaccines This list may not describe all possible interactions. Give your health care provider a list of all the medicines, herbs, non-prescription drugs, or dietary supplements you use. Also tell them if you smoke, drink alcohol, or use illegal drugs. Some items may interact with your medicine. What should I watch for while using this medicine? Report any side effects that do not go away within 3 days to your doctor or health care professional. Call your health care provider if any unusual symptoms occur within 6 weeks of receiving this vaccine. You may still catch the flu, but the illness is not usually as bad. You cannot get the flu from the vaccine. The vaccine will not protect against colds or other illnesses that may cause fever. The vaccine is needed every year. What side effects may I notice from receiving this medicine? Side effects that you should report to your doctor or health care professional as soon as possible: -allergic reactions like skin rash, itching or hives, swelling of the face, lips, or tongue Side effects that usually do not require medical attention (Report these to your doctor or health care professional if they continue or are bothersome.): -fever -headache -muscle aches and pains -pain, tenderness, redness, or swelling at the injection site -tiredness This list may not describe all possible side effects. Call your doctor for medical advice about side effects. You may report side effects to FDA at 1-800-FDA-1088. Where should I keep my medicine? The vaccine will be given by a health care professional in a clinic, pharmacy, doctor's office, or other health care setting. You will not be given vaccine doses to store at home. NOTE: This sheet is a summary. It may not cover   all possible information. If you have questions about this medicine, talk to your doctor, pharmacist, or health care provider.    2016, Elsevier/Gold Standard. (2011-07-03  14:06:47) Octreotide injection solution What is this medicine? OCTREOTIDE (ok TREE oh tide) is used to reduce blood levels of growth hormone in patients with a condition called acromegaly. This medicine also reduces flushing and watery diarrhea caused by certain types of cancer. This medicine may be used for other purposes; ask your health care provider or pharmacist if you have questions. What should I tell my health care provider before I take this medicine? They need to know if you have any of these conditions: -gallbladder disease -kidney disease -liver disease -an unusual or allergic reaction to octreotide, other medicines, foods, dyes, or preservatives -pregnant or trying to get pregnant -breast-feeding How should I use this medicine? This medicine is for injection under the skin or into a vein (only in emergency situations). It is usually given by a health care professional in a hospital or clinic setting. If you get this medicine at home, you will be taught how to prepare and give this medicine. Allow the injection solution to come to room temperature before use. Do not warm it artificially. Use exactly as directed. Take your medicine at regular intervals. Do not take your medicine more often than directed. It is important that you put your used needles and syringes in a special sharps container. Do not put them in a trash can. If you do not have a sharps container, call your pharmacist or healthcare provider to get one. Talk to your pediatrician regarding the use of this medicine in children. Special care may be needed. Overdosage: If you think you have taken too much of this medicine contact a poison control center or emergency room at once. NOTE: This medicine is only for you. Do not share this medicine with others. What if I miss a dose? If you miss a dose, take it as soon as you can. If it is almost time for your next dose, take only that dose. Do not take double or extra  doses. What may interact with this medicine? Do not take this medicine with any of the following medications: -cisapride -droperidol -general anesthetics -grepafloxacin -perphenazine -thioridazine This medicine may also interact with the following medications: -bromocriptine -cyclosporine -diuretics -medicines for blood pressure, heart disease, irregular heart beat -medicines for diabetes, including insulin -quinidine This list may not describe all possible interactions. Give your health care provider a list of all the medicines, herbs, non-prescription drugs, or dietary supplements you use. Also tell them if you smoke, drink alcohol, or use illegal drugs. Some items may interact with your medicine. What should I watch for while using this medicine? Visit your doctor or health care professional for regular checks on your progress. To help reduce irritation at the injection site, use a different site for each injection and make sure the solution is at room temperature before use. This medicine may cause increases or decreases in blood sugar. Signs of high blood sugar include frequent urination, unusual thirst, flushed or dry skin, difficulty breathing, drowsiness, stomach ache, nausea, vomiting or dry mouth. Signs of low blood sugar include chills, cool, pale skin or cold sweats, drowsiness, extreme hunger, fast heartbeat, headache, nausea, nervousness or anxiety, shakiness, trembling, unsteadiness, tiredness, or weakness. Contact your doctor or health care professional right away if you experience any of these symptoms. What side effects may I notice from receiving this medicine? Side effects that you  should report to your doctor or health care professional as soon as possible: -allergic reactions like skin rash, itching or hives, swelling of the face, lips, or tongue -changes in blood sugar -changes in heart rate -severe stomach pain Side effects that usually do not require medical  attention (report to your doctor or health care professional if they continue or are bothersome): -diarrhea or constipation -gas or stomach pain -nausea, vomiting -pain, redness, swelling and irritation at site where injected This list may not describe all possible side effects. Call your doctor for medical advice about side effects. You may report side effects to FDA at 1-800-FDA-1088. Where should I keep my medicine? Keep out of the reach of children. Store in a refrigerator between 2 and 8 degrees C (36 and 46 degrees F). Protect from light. Allow to come to room temperature naturally. Do not use artificial heat. If protected from light, the injection may be stored at room temperature between 20 and 30 degrees C (70 and 86 degrees F) for 14 days. After the initial use, throw away any unused portion of a multiple dose vial after 14 days. Throw away unused portions of the ampules after use. NOTE: This sheet is a summary. It may not cover all possible information. If you have questions about this medicine, talk to your doctor, pharmacist, or health care provider.    2016, Elsevier/Gold Standard. (2008-02-16 16:56:04) Octreotide injection solution What is this medicine? OCTREOTIDE (ok TREE oh tide) is used to reduce blood levels of growth hormone in patients with a condition called acromegaly. This medicine also reduces flushing and watery diarrhea caused by certain types of cancer. This medicine may be used for other purposes; ask your health care provider or pharmacist if you have questions. What should I tell my health care provider before I take this medicine? They need to know if you have any of these conditions: -gallbladder disease -kidney disease -liver disease -an unusual or allergic reaction to octreotide, other medicines, foods, dyes, or preservatives -pregnant or trying to get pregnant -breast-feeding How should I use this medicine? This medicine is for injection under the skin  or into a vein (only in emergency situations). It is usually given by a health care professional in a hospital or clinic setting. If you get this medicine at home, you will be taught how to prepare and give this medicine. Allow the injection solution to come to room temperature before use. Do not warm it artificially. Use exactly as directed. Take your medicine at regular intervals. Do not take your medicine more often than directed. It is important that you put your used needles and syringes in a special sharps container. Do not put them in a trash can. If you do not have a sharps container, call your pharmacist or healthcare provider to get one. Talk to your pediatrician regarding the use of this medicine in children. Special care may be needed. Overdosage: If you think you have taken too much of this medicine contact a poison control center or emergency room at once. NOTE: This medicine is only for you. Do not share this medicine with others. What if I miss a dose? If you miss a dose, take it as soon as you can. If it is almost time for your next dose, take only that dose. Do not take double or extra doses. What may interact with this medicine? Do not take this medicine with any of the following medications: -cisapride -droperidol -general anesthetics -grepafloxacin -perphenazine -thioridazine This medicine  may also interact with the following medications: -bromocriptine -cyclosporine -diuretics -medicines for blood pressure, heart disease, irregular heart beat -medicines for diabetes, including insulin -quinidine This list may not describe all possible interactions. Give your health care provider a list of all the medicines, herbs, non-prescription drugs, or dietary supplements you use. Also tell them if you smoke, drink alcohol, or use illegal drugs. Some items may interact with your medicine. What should I watch for while using this medicine? Visit your doctor or health care  professional for regular checks on your progress. To help reduce irritation at the injection site, use a different site for each injection and make sure the solution is at room temperature before use. This medicine may cause increases or decreases in blood sugar. Signs of high blood sugar include frequent urination, unusual thirst, flushed or dry skin, difficulty breathing, drowsiness, stomach ache, nausea, vomiting or dry mouth. Signs of low blood sugar include chills, cool, pale skin or cold sweats, drowsiness, extreme hunger, fast heartbeat, headache, nausea, nervousness or anxiety, shakiness, trembling, unsteadiness, tiredness, or weakness. Contact your doctor or health care professional right away if you experience any of these symptoms. What side effects may I notice from receiving this medicine? Side effects that you should report to your doctor or health care professional as soon as possible: -allergic reactions like skin rash, itching or hives, swelling of the face, lips, or tongue -changes in blood sugar -changes in heart rate -severe stomach pain Side effects that usually do not require medical attention (report to your doctor or health care professional if they continue or are bothersome): -diarrhea or constipation -gas or stomach pain -nausea, vomiting -pain, redness, swelling and irritation at site where injected This list may not describe all possible side effects. Call your doctor for medical advice about side effects. You may report side effects to FDA at 1-800-FDA-1088. Where should I keep my medicine? Keep out of the reach of children. Store in a refrigerator between 2 and 8 degrees C (36 and 46 degrees F). Protect from light. Allow to come to room temperature naturally. Do not use artificial heat. If protected from light, the injection may be stored at room temperature between 20 and 30 degrees C (70 and 86 degrees F) for 14 days. After the initial use, throw away any unused  portion of a multiple dose vial after 14 days. Throw away unused portions of the ampules after use. NOTE: This sheet is a summary. It may not cover all possible information. If you have questions about this medicine, talk to your doctor, pharmacist, or health care provider.    2016, Elsevier/Gold Standard. (2008-02-16 16:56:04)

## 2016-07-31 NOTE — Telephone Encounter (Signed)
Appointments rescheduled per patient request. Per the patient he needed to have his injection appointment scheduled for the first week of February and March per a switch in insurance.

## 2016-08-07 ENCOUNTER — Other Ambulatory Visit: Payer: Self-pay | Admitting: Internal Medicine

## 2016-08-23 ENCOUNTER — Telehealth: Payer: Self-pay | Admitting: *Deleted

## 2016-08-23 NOTE — Telephone Encounter (Signed)
Call from pt reporting he received a $12k bill for his Dec Sandostatin injection. He has been in touch with [facility] billing and his insurance co. Was told the diagnosis code was incorrect for 12/28 date of service. Matter is currently under review. Asks if Dr. [name] will write a letter to confirm his diagnosis has not changed. He will let us know what he needs after review is complete. Message sent to [facility] coder to weigh in.

## 2016-08-23 NOTE — Telephone Encounter (Signed)
Returned call to pt, coder reviewed 12/27 date of service- there was a coding error. Correction will be submitted. Pt voiced appreciation for call.

## 2016-08-27 ENCOUNTER — Ambulatory Visit: Payer: BLUE CROSS/BLUE SHIELD

## 2016-09-02 NOTE — Telephone Encounter (Signed)
error 

## 2016-09-05 ENCOUNTER — Ambulatory Visit (HOSPITAL_BASED_OUTPATIENT_CLINIC_OR_DEPARTMENT_OTHER): Payer: Medicare Other

## 2016-09-05 VITALS — BP 115/71 | HR 80 | Temp 99.0°F

## 2016-09-05 DIAGNOSIS — C7B8 Other secondary neuroendocrine tumors: Secondary | ICD-10-CM

## 2016-09-05 DIAGNOSIS — C7A8 Other malignant neuroendocrine tumors: Secondary | ICD-10-CM | POA: Diagnosis present

## 2016-09-05 DIAGNOSIS — D3A8 Other benign neuroendocrine tumors: Secondary | ICD-10-CM

## 2016-09-05 MED ORDER — OCTREOTIDE ACETATE 30 MG IM KIT
30.0000 mg | PACK | Freq: Once | INTRAMUSCULAR | Status: AC
  Administered 2016-09-05: 30 mg via INTRAMUSCULAR
  Filled 2016-09-05: qty 1

## 2016-09-06 ENCOUNTER — Other Ambulatory Visit: Payer: Self-pay | Admitting: Oncology

## 2016-09-06 DIAGNOSIS — D3A8 Other benign neuroendocrine tumors: Secondary | ICD-10-CM

## 2016-09-11 ENCOUNTER — Telehealth: Payer: Self-pay | Admitting: *Deleted

## 2016-09-11 NOTE — Telephone Encounter (Signed)
Call placed to patient to inform him that we will reschedule his appt on 09/26/16 with Dr. [name] to 10/02/16 at 11:00AM after his scan on 10/01/16.  Patient appreciative of call and has no questions at this time.

## 2016-09-13 ENCOUNTER — Ambulatory Visit (INDEPENDENT_AMBULATORY_CARE_PROVIDER_SITE_OTHER): Payer: Medicare Other | Admitting: Nurse Practitioner

## 2016-09-13 ENCOUNTER — Encounter: Payer: Self-pay | Admitting: Nurse Practitioner

## 2016-09-13 VITALS — BP 158/84 | HR 83 | Temp 97.6°F | Ht 70.0 in | Wt 187.0 lb

## 2016-09-13 DIAGNOSIS — J209 Acute bronchitis, unspecified: Secondary | ICD-10-CM

## 2016-09-13 MED ORDER — DM-GUAIFENESIN ER 30-600 MG PO TB12: 1.0000 | ORAL_TABLET | Freq: Two times a day (BID) | ORAL | 0 refills | Status: DC | PRN

## 2016-09-13 MED ORDER — AZITHROMYCIN 500 MG PO TABS: 500.0000 mg | ORAL_TABLET | Freq: Every day | ORAL | 0 refills | Status: DC

## 2016-09-13 MED ORDER — BENZONATATE 100 MG PO CAPS: 100.0000 mg | ORAL_CAPSULE | Freq: Three times a day (TID) | ORAL | 0 refills | Status: DC | PRN

## 2016-09-13 NOTE — Patient Instructions (Addendum)
Encourage adequate oral hydration.  Monitor BP at home Call office if BP is greater than 150/90 for the next 3days.  Order CXR in no improvement in 1week.

## 2016-09-13 NOTE — Progress Notes (Signed)
Pre visit review using our clinic review tool, if applicable. No additional management support is needed unless otherwise documented below in the visit note. 

## 2016-09-13 NOTE — Progress Notes (Signed)
Subjective:  Patient ID: Ronald Thompson, male    DOB: March 06, 1956  Age: 61 y.o. MRN: [id number]  CC: Acute Visit (deep dry cough worse at night)   Cough  This is a new problem. The current episode started in the past 7 days. The problem has been gradually worsening. The problem occurs constantly. The cough is non-productive. Associated symptoms include chest pain, nasal congestion, postnasal drip and shortness of breath. Pertinent negatives include no chills, ear pain, fever, headaches, rhinorrhea or wheezing. The symptoms are aggravated by lying down. He has tried OTC cough suppressant for the symptoms. The treatment provided no relief. His past medical history is significant for bronchitis.    Outpatient Medications Prior to Visit  Medication Sig Dispense Refill  . acetaminophen (TYLENOL) 500 MG tablet Take 1,000 mg by mouth every 6 (six) hours as needed for headache.    Marland Kitchen aspirin EC 81 MG tablet Take 81 mg by mouth every evening.    . calcium carbonate (TUMS - DOSED IN MG ELEMENTAL CALCIUM) 500 MG chewable tablet Chew 3 tablets by mouth 3 (three) times daily as needed for indigestion or heartburn.     . clindamycin (CLEOCIN) 300 MG capsule Take 2 capsules by mouth one hour prior to dental appointment 6 capsule 2  . clopidogrel (PLAVIX) 75 MG tablet Take 1 tablet (75 mg total) by mouth daily with breakfast. 30 tablet 11  . docusate sodium (COLACE) 100 MG capsule Take 100 mg by mouth daily as needed for mild constipation.    Marland Kitchen ibuprofen (ADVIL,MOTRIN) 200 MG tablet Take 400 mg by mouth every 6 (six) hours as needed for headache or mild pain.     Marland Kitchen losartan (COZAAR) 50 MG tablet TAKE ONE TABLET BY MOUTH DAILY 30 tablet 3  . metoprolol tartrate (LOPRESSOR) 25 MG tablet Take 0.5 tablets (12.5 mg total) by mouth 2 (two) times daily. 60 tablet 11  . Octreotide Acetate (SANDOSTATIN IJ) Inject 1 each as directed every 30 (thirty) days.     Marland Kitchen oxyCODONE (OXY IR/ROXICODONE) 5 MG immediate release tablet  1-2 tabs PO Q 6 hours PRN pain. 45 tablet 0  . sildenafil (REVATIO) 20 MG tablet Take 20 mg by mouth daily as needed (for ED).     Marland Kitchen simvastatin (ZOCOR) 20 MG tablet TAKE 1 TABLET (20 MG TOTAL) BY MOUTH AT BEDTIME. 30 tablet 5  . traMADol (ULTRAM) 50 MG tablet Take 1 tablet (50 mg total) by mouth every 4 (four) hours as needed for moderate pain. 28 tablet 0   No facility-administered medications prior to visit.     ROS See HPI  Objective:  BP (!) 158/84 (BP Location: Left Arm, Patient Position: Sitting, Cuff Size: Normal)   Pulse 83   Temp 97.6 F (36.4 C) (Oral)   Ht 5\' 10"  (1.778 m)   Wt 187 lb (84.8 kg)   SpO2 99%   BMI 26.83 kg/m   BP Readings from Last 3 Encounters:  09/13/16 (!) 158/84  09/05/16 115/71  07/31/16 (!) 144/71    Wt Readings from Last 3 Encounters:  09/13/16 187 lb (84.8 kg)  07/22/16 185 lb 9.6 oz (84.2 kg)  07/02/16 187 lb 1.6 oz (84.9 kg)    Physical Exam  Constitutional: He is oriented to person, place, and time. No distress.  HENT:  Right Ear: Tympanic membrane, external ear and ear canal normal.  Left Ear: Tympanic membrane and ear canal normal.  Nose: Mucosal edema and rhinorrhea present. Right sinus exhibits maxillary  sinus tenderness and frontal sinus tenderness. Left sinus exhibits maxillary sinus tenderness and frontal sinus tenderness.  Mouth/Throat: Uvula is midline. Posterior oropharyngeal erythema present. No oropharyngeal exudate.  Eyes: No scleral icterus.  Neck: Normal range of motion. Neck supple.  Cardiovascular: Normal rate and regular rhythm.   Pulmonary/Chest: Effort normal. No respiratory distress. He has no wheezes. He has no rales.  Diminished lung sounds in LLL. CXR indicates chronic LLL atelectasis.  Musculoskeletal: He exhibits no edema.  Lymphadenopathy:    He has no cervical adenopathy.  Neurological: He is alert and oriented to person, place, and time.  Skin: Skin is warm and dry.  Vitals reviewed.   Lab Results   Component Value Date   WBC 7.3 07/22/2016   HGB 15.5 07/22/2016   HCT 47.0 07/22/2016   PLT 182 07/22/2016   GLUCOSE 119 07/22/2016   CHOL 158 11/08/2015   TRIG 75.0 11/08/2015   HDL 51.50 11/08/2015   LDLDIRECT 139.2 10/10/2008   LDLCALC 92 11/08/2015   ALT 24 07/22/2016   AST 25 07/22/2016   NA 139 07/22/2016   K 4.7 07/22/2016   CL 101 04/17/2016   CREATININE 1.0 07/22/2016   BUN 21.3 07/22/2016   CO2 29 07/22/2016   TSH 1.65 11/08/2015   PSA 1.06 11/08/2015   INR 1.30 04/16/2016   HGBA1C 5.9 (H) 04/12/2016    No results found.  Assessment & Plan:   Lyan was seen today for acute visit.  Diagnoses and all orders for this visit:  Acute bronchitis, unspecified organism -     benzonatate (TESSALON) 100 MG capsule; Take 1 capsule (100 mg total) by mouth 3 (three) times daily as needed for cough. -     dextromethorphan-guaiFENesin (MUCINEX DM) 30-600 MG 12hr tablet; Take 1 tablet by mouth 2 (two) times daily as needed for cough. -     azithromycin (ZITHROMAX) 500 MG tablet; Take 1 tablet (500 mg total) by mouth daily.   I am having Mr. Batten start on benzonatate, dextromethorphan-guaiFENesin, and azithromycin. I am also having him maintain his calcium carbonate, ibuprofen, Octreotide Acetate (SANDOSTATIN IJ), simvastatin, sildenafil, docusate sodium, acetaminophen, traMADol, aspirin EC, clopidogrel, metoprolol tartrate, clindamycin, oxyCODONE, and losartan.  Meds ordered this encounter  Medications  . benzonatate (TESSALON) 100 MG capsule    Sig: Take 1 capsule (100 mg total) by mouth 3 (three) times daily as needed for cough.    Dispense:  20 capsule    Refill:  0    Order Specific Question:   Supervising Provider    Answer:   Cassandria Anger [[id number]]  . dextromethorphan-guaiFENesin (MUCINEX DM) 30-600 MG 12hr tablet    Sig: Take 1 tablet by mouth 2 (two) times daily as needed for cough.    Dispense:  14 tablet    Refill:  0    Order Specific Question:    Supervising Provider    Answer:   Cassandria Anger [[id number]]  . azithromycin (ZITHROMAX) 500 MG tablet    Sig: Take 1 tablet (500 mg total) by mouth daily.    Dispense:  5 tablet    Refill:  0    Order Specific Question:   Supervising Provider    Answer:   Cassandria Anger [[id number]]    Follow-up: Return if symptoms worsen or fail to improve.  Wilfred Lacy, NP

## 2016-09-19 ENCOUNTER — Telehealth: Payer: Self-pay | Admitting: Oncology

## 2016-09-19 ENCOUNTER — Encounter (HOSPITAL_COMMUNITY): Payer: Self-pay | Admitting: Internal Medicine

## 2016-09-19 NOTE — Progress Notes (Signed)
Mailed letter with [facility] to pt ... KJ  °

## 2016-09-19 NOTE — Telephone Encounter (Signed)
Patient came to scheduling to have appointment rescheduled to prevent traveling on multiple days.

## 2016-09-26 ENCOUNTER — Ambulatory Visit: Payer: BLUE CROSS/BLUE SHIELD | Admitting: Oncology

## 2016-09-26 ENCOUNTER — Ambulatory Visit: Payer: BLUE CROSS/BLUE SHIELD

## 2016-09-30 ENCOUNTER — Other Ambulatory Visit: Payer: Self-pay | Admitting: Internal Medicine

## 2016-10-01 ENCOUNTER — Ambulatory Visit (HOSPITAL_COMMUNITY)
Admission: RE | Admit: 2016-10-01 | Discharge: 2016-10-01 | Disposition: A | Discharge status: Home / Self Care | Payer: Medicare Other | Source: Ambulatory Visit | Attending: Oncology | Admitting: Oncology

## 2016-10-01 DIAGNOSIS — C787 Secondary malignant neoplasm of liver and intrahepatic bile duct: Secondary | ICD-10-CM | POA: Insufficient documentation

## 2016-10-01 DIAGNOSIS — C259 Malignant neoplasm of pancreas, unspecified: Secondary | ICD-10-CM | POA: Diagnosis not present

## 2016-10-01 DIAGNOSIS — D3A8 Other benign neuroendocrine tumors: Secondary | ICD-10-CM | POA: Diagnosis not present

## 2016-10-01 DIAGNOSIS — C7951 Secondary malignant neoplasm of bone: Secondary | ICD-10-CM | POA: Insufficient documentation

## 2016-10-01 MED ORDER — GALLIUM GA 68 DOTATATE IV KIT
4.7200 | PACK | Freq: Once | INTRAVENOUS | Status: AC
  Administered 2016-10-01: 4.72 via INTRAVENOUS

## 2016-10-02 ENCOUNTER — Ambulatory Visit (HOSPITAL_BASED_OUTPATIENT_CLINIC_OR_DEPARTMENT_OTHER): Payer: Medicare Other

## 2016-10-02 ENCOUNTER — Ambulatory Visit (HOSPITAL_BASED_OUTPATIENT_CLINIC_OR_DEPARTMENT_OTHER): Payer: Medicare Other | Admitting: Oncology

## 2016-10-02 ENCOUNTER — Telehealth: Payer: Self-pay | Admitting: Oncology

## 2016-10-02 VITALS — BP 138/86 | HR 74 | Temp 97.7°F | Resp 19 | Wt 186.3 lb

## 2016-10-02 DIAGNOSIS — C7A8 Other malignant neuroendocrine tumors: Secondary | ICD-10-CM

## 2016-10-02 DIAGNOSIS — C7B8 Other secondary neuroendocrine tumors: Secondary | ICD-10-CM

## 2016-10-02 DIAGNOSIS — D3A8 Other benign neuroendocrine tumors: Secondary | ICD-10-CM

## 2016-10-02 MED ORDER — OCTREOTIDE ACETATE 30 MG IM KIT
30.0000 mg | PACK | Freq: Once | INTRAMUSCULAR | Status: AC
  Administered 2016-10-02: 30 mg via INTRAMUSCULAR
  Filled 2016-10-02: qty 1

## 2016-10-02 NOTE — Progress Notes (Signed)
[place] OFFICE PROGRESS NOTE   Diagnosis: Pancreatic neuroendocrine tumor  INTERVAL HISTORY:   Ronald Thompson returns as scheduled. He reports malaise. No recurrent abdominal pain. He is exercising. He has occasional "tremors "at night, relieved with eating. No flushing, sweats, or diarrhea.  Objective:  Vital signs in last 24 hours:  Blood pressure 138/86, pulse 74, temperature 97.7 F (36.5 C), temperature source Oral, resp. rate 19, weight 186 lb 4.8 oz (84.5 kg), SpO2 99 %.    Resp: Lungs clear bilaterally, no respiratory distress Cardio: Regular rate and rhythm GI: No hepatosplenomegaly, no mass, nontender Vascular: No leg edema   Imaging:  Nm Pet (netspot Ga 68 Dotatate) Skull Base To Mid Thigh  Addendum Date: 10/01/2016   ADDENDUM REPORT: 10/01/2016 14:14 ADDENDUM: Radiopharmaceutical correction: 4.8 microcuries Ga 68 DOTATATE Electronically Signed   By: Suzy Bouchard M.D.   On: 10/01/2016 14:14   Result Date: 10/01/2016 CLINICAL DATA:  Primary neuroendocrine tumor of pancreas with metastatic disease to liver. EXAM: NUCLEAR MEDICINE PET SKULL BASE TO THIGH TECHNIQUE: 4.8 mCi F-18 Fluciclovine was injected intravenously. Full-ring PET imaging was performed from the skull base to thigh after the radiotracer. CT data was obtained and used for attenuation correction and anatomic localization. COMPARISON:  None. FINDINGS: NECK No radiotracer activity in neck lymph nodes. CHEST No radiotracer accumulation within mediastinal or hilar lymph nodes. No suspicious pulmonary nodules on the CT scan. ABDOMEN/PELVIS Multiple sites of intense rounded somatostatin receptor radiotracer activity within the liver. These lesions correspond to the rounded lesions on comparison noncontrast CT. Example lesion RIGHT hepatic lobe measures 5.3 cm with SUV max of 11.6. Similar lesion LEFT hepatic lobe measuring 3.2 cm with SUV max equal 10.99. Approximately 12-15 lesions in the liver. Along  the ventral surface of the pancreas there is a region of intense radiotracer activity over an approximately 4.3 cm segment with SUV max equal 14. Adjacent to the pancreatic lesion are several small lymph nodes with radiotracer activity Measuring 10 mm short axis. Physiologic activity is noted in the adrenal glands kidneys. No more distant hypermetabolic adenopathy or peritoneal disease. No abnormal activity the bowel. SKELETON Single focus intense radiotracer activity within the posterior LEFT iliac bone the with SUV max 6.9. No clear CT abnormality at this level. IMPRESSION: 1. Intense somatostatin receptor radiotracer activity within lesion in the mid body pancreas consistent with neuroendocrine tumor. Local peripancreatic nodal metastasis noted. 2. Intense somatostatin radiotracer activity within multiple hepatic metastatic lesions. 3. High concern for a small metastatic lesion within the LEFT iliac bone with no CT correlation. Electronically Signed: By: Suzy Bouchard M.D. On: 10/01/2016 09:24    Medications: I have reviewed the patient's current medications.  Assessment/Plan: 1. Pancreatic neuroendocrine tumor, WHO grade 2, pancreatic head mass and small peripancreatic/celiac nodes on a CT 02/04/2014  EUS revealed evidence of multiple liver metastases-status post an FNA biopsy of a left liver lesion 02/10/2014 confirming a neuroendocrine tumor   Octreotide scan 04/01/2014 with multiple foci of metastatic neuroendocrine tumor in the liver  Monthly Sandostatin started 03/16/2014  Restaging CT 07/04/2014 with resolution of a previously noted pancreas head lesion and probable progression of liver metastases  Restaging CT 10/31/2014-stable liver lesions, no evidence of a pancreas mass, stable.  Restaging CT 02/28/2015-stable hepatic metastases, stable pancreas lesion unchanged  Restaging CT 08/30/2015-stable pancreas mass, slight enlargement of hepatic metastases  Monthly Sandostatin  continued  Restaging CTs06/14/2017 revealed enlargement of liver lesions, no new lesions, enlargement of the pancreas head mass  Gallium DOTATATE scan 10/01/2016 confirmed uptake in the liver metastases, pancreas primary, peripancreatic adenopathy, and left iliac bone  2. Chest Seminoma in 1990 treated with BEP and chest radiation at [facility] 3. 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 chronic exertional dyspnea following treatment for the seminoma  5. aortic stenosis on an echocardiogram December 2011, severe aortic stenosis on echocardiogram 12/06/2015, status postTAVR on 04/16/2016 6. lumbar disc surgery 2014  7. acute abdominal pain 02/04/2014-resolved     Disposition:  He appears unchanged. The plan is to continue monthly Sandostatin. The nuclear medicine scan confirms uptake of the DOTATATE agent. He appears to be a candidate for Lutathera when this is available. We should be able to administer treatment here within the next few months. Dr. Leamon Arnt may have availability sooner. I reviewed the nuclear medicine images with him today. He has no symptoms to suggest an iliac metastasis.  He will return for an office visit and Sandostatin in one month.  25 minutes were spent with the patient today. The majority of the time was used for counseling and coordination of care.  Betsy Coder, MD  10/02/2016  11:39 AM

## 2016-10-02 NOTE — Telephone Encounter (Signed)
Gave patient avs report and appointments for March  °

## 2016-10-02 NOTE — Patient Instructions (Signed)
Influenza Virus Vaccine (Flucelvax) What is this medicine? INFLUENZA VIRUS VACCINE (in floo EN zuh VAHY ruhs vak SEEN) helps to reduce the risk of getting influenza also known as the flu. The vaccine only helps protect you against some strains of the flu. This medicine may be used for other purposes; ask your health care provider or pharmacist if you have questions. What should I tell my health care provider before I take this medicine? They need to know if you have any of these conditions: -bleeding disorder like hemophilia -fever or infection -Guillain-Barre syndrome or other neurological problems -immune system problems -infection with the human immunodeficiency virus (HIV) or AIDS -low blood platelet counts -multiple sclerosis -an unusual or allergic reaction to influenza virus vaccine, other medicines, foods, dyes or preservatives -pregnant or trying to get pregnant -breast-feeding How should I use this medicine? This vaccine is for injection into a muscle. It is given by a health care professional. A copy of Vaccine Information Statements will be given before each vaccination. Read this sheet carefully each time. The sheet may change frequently. Talk to your pediatrician regarding the use of this medicine in children. Special care may be needed. Overdosage: If you think you've taken too much of this medicine contact a poison control center or emergency room at once. Overdosage: If you think you have taken too much of this medicine contact a poison control center or emergency room at once. NOTE: This medicine is only for you. Do not share this medicine with others. What if I miss a dose? This does not apply. What may interact with this medicine? -chemotherapy or radiation therapy -medicines that lower your immune system like etanercept, anakinra, infliximab, and adalimumab -medicines that treat or prevent blood clots like warfarin -phenytoin -steroid medicines like prednisone or  cortisone -theophylline -vaccines This list may not describe all possible interactions. Give your health care provider a list of all the medicines, herbs, non-prescription drugs, or dietary supplements you use. Also tell them if you smoke, drink alcohol, or use illegal drugs. Some items may interact with your medicine. What should I watch for while using this medicine? Report any side effects that do not go away within 3 days to your doctor or health care professional. Call your health care provider if any unusual symptoms occur within 6 weeks of receiving this vaccine. You may still catch the flu, but the illness is not usually as bad. You cannot get the flu from the vaccine. The vaccine will not protect against colds or other illnesses that may cause fever. The vaccine is needed every year. What side effects may I notice from receiving this medicine? Side effects that you should report to your doctor or health care professional as soon as possible: -allergic reactions like skin rash, itching or hives, swelling of the face, lips, or tongue Side effects that usually do not require medical attention (Report these to your doctor or health care professional if they continue or are bothersome.): -fever -headache -muscle aches and pains -pain, tenderness, redness, or swelling at the injection site -tiredness This list may not describe all possible side effects. Call your doctor for medical advice about side effects. You may report side effects to FDA at 1-800-FDA-1088. Where should I keep my medicine? The vaccine will be given by a health care professional in a clinic, pharmacy, doctor's office, or other health care setting. You will not be given vaccine doses to store at home. NOTE: This sheet is a summary. It may not cover   all possible information. If you have questions about this medicine, talk to your doctor, pharmacist, or health care provider.    2016, Elsevier/Gold Standard. (2011-07-03  14:06:47) Octreotide injection solution What is this medicine? OCTREOTIDE (ok TREE oh tide) is used to reduce blood levels of growth hormone in patients with a condition called acromegaly. This medicine also reduces flushing and watery diarrhea caused by certain types of cancer. This medicine may be used for other purposes; ask your health care provider or pharmacist if you have questions. What should I tell my health care provider before I take this medicine? They need to know if you have any of these conditions: -gallbladder disease -kidney disease -liver disease -an unusual or allergic reaction to octreotide, other medicines, foods, dyes, or preservatives -pregnant or trying to get pregnant -breast-feeding How should I use this medicine? This medicine is for injection under the skin or into a vein (only in emergency situations). It is usually given by a health care professional in a hospital or clinic setting. If you get this medicine at home, you will be taught how to prepare and give this medicine. Allow the injection solution to come to room temperature before use. Do not warm it artificially. Use exactly as directed. Take your medicine at regular intervals. Do not take your medicine more often than directed. It is important that you put your used needles and syringes in a special sharps container. Do not put them in a trash can. If you do not have a sharps container, call your pharmacist or healthcare provider to get one. Talk to your pediatrician regarding the use of this medicine in children. Special care may be needed. Overdosage: If you think you have taken too much of this medicine contact a poison control center or emergency room at once. NOTE: This medicine is only for you. Do not share this medicine with others. What if I miss a dose? If you miss a dose, take it as soon as you can. If it is almost time for your next dose, take only that dose. Do not take double or extra  doses. What may interact with this medicine? Do not take this medicine with any of the following medications: -cisapride -droperidol -general anesthetics -grepafloxacin -perphenazine -thioridazine This medicine may also interact with the following medications: -bromocriptine -cyclosporine -diuretics -medicines for blood pressure, heart disease, irregular heart beat -medicines for diabetes, including insulin -quinidine This list may not describe all possible interactions. Give your health care provider a list of all the medicines, herbs, non-prescription drugs, or dietary supplements you use. Also tell them if you smoke, drink alcohol, or use illegal drugs. Some items may interact with your medicine. What should I watch for while using this medicine? Visit your doctor or health care professional for regular checks on your progress. To help reduce irritation at the injection site, use a different site for each injection and make sure the solution is at room temperature before use. This medicine may cause increases or decreases in blood sugar. Signs of high blood sugar include frequent urination, unusual thirst, flushed or dry skin, difficulty breathing, drowsiness, stomach ache, nausea, vomiting or dry mouth. Signs of low blood sugar include chills, cool, pale skin or cold sweats, drowsiness, extreme hunger, fast heartbeat, headache, nausea, nervousness or anxiety, shakiness, trembling, unsteadiness, tiredness, or weakness. Contact your doctor or health care professional right away if you experience any of these symptoms. What side effects may I notice from receiving this medicine? Side effects that you  should report to your doctor or health care professional as soon as possible: -allergic reactions like skin rash, itching or hives, swelling of the face, lips, or tongue -changes in blood sugar -changes in heart rate -severe stomach pain Side effects that usually do not require medical  attention (report to your doctor or health care professional if they continue or are bothersome): -diarrhea or constipation -gas or stomach pain -nausea, vomiting -pain, redness, swelling and irritation at site where injected This list may not describe all possible side effects. Call your doctor for medical advice about side effects. You may report side effects to FDA at 1-800-FDA-1088. Where should I keep my medicine? Keep out of the reach of children. Store in a refrigerator between 2 and 8 degrees C (36 and 46 degrees F). Protect from light. Allow to come to room temperature naturally. Do not use artificial heat. If protected from light, the injection may be stored at room temperature between 20 and 30 degrees C (70 and 86 degrees F) for 14 days. After the initial use, throw away any unused portion of a multiple dose vial after 14 days. Throw away unused portions of the ampules after use. NOTE: This sheet is a summary. It may not cover all possible information. If you have questions about this medicine, talk to your doctor, pharmacist, or health care provider.    2016, Elsevier/Gold Standard. (2008-02-16 16:56:04) Octreotide injection solution What is this medicine? OCTREOTIDE (ok TREE oh tide) is used to reduce blood levels of growth hormone in patients with a condition called acromegaly. This medicine also reduces flushing and watery diarrhea caused by certain types of cancer. This medicine may be used for other purposes; ask your health care provider or pharmacist if you have questions. What should I tell my health care provider before I take this medicine? They need to know if you have any of these conditions: -gallbladder disease -kidney disease -liver disease -an unusual or allergic reaction to octreotide, other medicines, foods, dyes, or preservatives -pregnant or trying to get pregnant -breast-feeding How should I use this medicine? This medicine is for injection under the skin  or into a vein (only in emergency situations). It is usually given by a health care professional in a hospital or clinic setting. If you get this medicine at home, you will be taught how to prepare and give this medicine. Allow the injection solution to come to room temperature before use. Do not warm it artificially. Use exactly as directed. Take your medicine at regular intervals. Do not take your medicine more often than directed. It is important that you put your used needles and syringes in a special sharps container. Do not put them in a trash can. If you do not have a sharps container, call your pharmacist or healthcare provider to get one. Talk to your pediatrician regarding the use of this medicine in children. Special care may be needed. Overdosage: If you think you have taken too much of this medicine contact a poison control center or emergency room at once. NOTE: This medicine is only for you. Do not share this medicine with others. What if I miss a dose? If you miss a dose, take it as soon as you can. If it is almost time for your next dose, take only that dose. Do not take double or extra doses. What may interact with this medicine? Do not take this medicine with any of the following medications: -cisapride -droperidol -general anesthetics -grepafloxacin -perphenazine -thioridazine This medicine  may also interact with the following medications: -bromocriptine -cyclosporine -diuretics -medicines for blood pressure, heart disease, irregular heart beat -medicines for diabetes, including insulin -quinidine This list may not describe all possible interactions. Give your health care provider a list of all the medicines, herbs, non-prescription drugs, or dietary supplements you use. Also tell them if you smoke, drink alcohol, or use illegal drugs. Some items may interact with your medicine. What should I watch for while using this medicine? Visit your doctor or health care  professional for regular checks on your progress. To help reduce irritation at the injection site, use a different site for each injection and make sure the solution is at room temperature before use. This medicine may cause increases or decreases in blood sugar. Signs of high blood sugar include frequent urination, unusual thirst, flushed or dry skin, difficulty breathing, drowsiness, stomach ache, nausea, vomiting or dry mouth. Signs of low blood sugar include chills, cool, pale skin or cold sweats, drowsiness, extreme hunger, fast heartbeat, headache, nausea, nervousness or anxiety, shakiness, trembling, unsteadiness, tiredness, or weakness. Contact your doctor or health care professional right away if you experience any of these symptoms. What side effects may I notice from receiving this medicine? Side effects that you should report to your doctor or health care professional as soon as possible: -allergic reactions like skin rash, itching or hives, swelling of the face, lips, or tongue -changes in blood sugar -changes in heart rate -severe stomach pain Side effects that usually do not require medical attention (report to your doctor or health care professional if they continue or are bothersome): -diarrhea or constipation -gas or stomach pain -nausea, vomiting -pain, redness, swelling and irritation at site where injected This list may not describe all possible side effects. Call your doctor for medical advice about side effects. You may report side effects to FDA at 1-800-FDA-1088. Where should I keep my medicine? Keep out of the reach of children. Store in a refrigerator between 2 and 8 degrees C (36 and 46 degrees F). Protect from light. Allow to come to room temperature naturally. Do not use artificial heat. If protected from light, the injection may be stored at room temperature between 20 and 30 degrees C (70 and 86 degrees F) for 14 days. After the initial use, throw away any unused  portion of a multiple dose vial after 14 days. Throw away unused portions of the ampules after use. NOTE: This sheet is a summary. It may not cover all possible information. If you have questions about this medicine, talk to your doctor, pharmacist, or health care provider.    2016, Elsevier/Gold Standard. (2008-02-16 16:56:04)

## 2016-10-03 ENCOUNTER — Ambulatory Visit: Payer: BLUE CROSS/BLUE SHIELD

## 2016-10-09 ENCOUNTER — Other Ambulatory Visit: Payer: Self-pay | Admitting: *Deleted

## 2016-10-09 MED ORDER — LOSARTAN POTASSIUM 50 MG PO TABS: 50.0000 mg | ORAL_TABLET | Freq: Every day | ORAL | 0 refills | Status: DC

## 2016-10-21 ENCOUNTER — Encounter: Payer: Self-pay | Admitting: Family

## 2016-10-21 ENCOUNTER — Telehealth (HOSPITAL_COMMUNITY): Payer: Self-pay | Admitting: Internal Medicine

## 2016-10-21 ENCOUNTER — Ambulatory Visit (INDEPENDENT_AMBULATORY_CARE_PROVIDER_SITE_OTHER): Payer: Medicare Other | Admitting: Family

## 2016-10-21 DIAGNOSIS — J208 Acute bronchitis due to other specified organisms: Secondary | ICD-10-CM

## 2016-10-21 MED ORDER — AZITHROMYCIN 500 MG PO TABS: 500.0000 mg | ORAL_TABLET | Freq: Every day | ORAL | 0 refills | Status: DC

## 2016-10-21 NOTE — Telephone Encounter (Signed)
Patient has Medicare A/B and [employer] Medicare supplement, insurance benefits verified. [employer] Medicare supplement - no co-payment, no deductible, no out of pocket max, no co-insurance, no pre-authorization and no limit. They follow Medicare guidelines.

## 2016-10-21 NOTE — Progress Notes (Signed)
Subjective:    Patient ID: Ronald Thompson, male    DOB: 1956/06/17, 61 y.o.   MRN: [id number]  Chief Complaint  Patient presents with  . Cough    congestion, fatigue, heavy chest feeling, cough, x4 days    HPI:  Ronald Thompson is Thompson 61 y.o. 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 and presents today for an acute office visit.   This is Thompson new problem. Associated symptom of congestion, fatigue, heavy chest feeling and cough has been going on for about 4 days. Denies fevers. Modifying factors include Advil and Tylenol which have helped Thompson little with the body aches. Has had bronchitis in the past and this feels similar to previous episodes. No recent antibiotics. Severity of the cough is enough to keep him up at night.   Allergies  Allergen Reactions  . Penicillins Hives    ENTIRE BODY Has patient had Thompson PCN reaction causing immediate rash, facial/tongue/throat swelling, SOB or lightheadedness with hypotension: No Has patient had Thompson PCN reaction causing severe rash involving mucus membranes or skin necrosis: No Has patient had Thompson PCN reaction that required hospitalization * *  YES  * * Has patient had Thompson PCN reaction occurring within the last 10 years: No If all of the above answers are "NO", then may proceed with Cephalosporin use.      Outpatient Medications Prior to Visit  Medication Sig Dispense Refill  . acetaminophen (TYLENOL) 500 MG tablet Take 1,000 mg by mouth every 6 (six) hours as needed for headache.    Marland Kitchen aspirin EC 81 MG tablet Take 81 mg by mouth every  evening.    . calcium carbonate (TUMS - DOSED IN MG ELEMENTAL CALCIUM) 500 MG chewable tablet Chew 3 tablets by mouth 3 (three) times daily as needed for indigestion or heartburn.     . clindamycin (CLEOCIN) 300 MG capsule Take 2 capsules by mouth one hour prior to dental appointment 6 capsule 2  . clopidogrel (PLAVIX) 75 MG tablet Take 1 tablet (75 mg total) by mouth daily with breakfast. 30 tablet 11  . docusate sodium (COLACE) 100 MG capsule Take 100 mg by mouth daily as needed for mild constipation.    Marland Kitchen ibuprofen (ADVIL,MOTRIN) 200 MG tablet Take 400 mg by mouth every 6 (six) hours as needed for headache or mild pain.     Marland Kitchen losartan (COZAAR) 50 MG tablet Take 1 tablet (50 mg total) by mouth daily. Yearly physical due in April must see Md for future refills 30 tablet 0  . metoprolol tartrate (LOPRESSOR) 25 MG tablet Take 0.5 tablets (12.5 mg total) by mouth 2 (two) times daily. 60 tablet 11  . Octreotide Acetate (SANDOSTATIN IJ) Inject 1 each as directed every 30 (thirty) days.     Marland Kitchen oxyCODONE (OXY IR/ROXICODONE) 5 MG immediate release tablet 1-2 tabs Thompson Q 6 hours PRN pain. 45 tablet 0  . sildenafil (REVATIO) 20 MG tablet Take 20 mg by mouth daily as needed (for ED).     Marland Kitchen simvastatin (ZOCOR) 20 MG tablet Take 1 tablet (20 mg total) by mouth daily at 6 PM. Yearly physical due in April must see MD for refills  90 tablet 0  . traMADol (ULTRAM) 50 MG tablet Take 1 tablet (50 mg total) by mouth every 4 (four) hours as needed for moderate pain. 28 tablet 0   No facility-administered medications prior to visit.     Review of Systems  Constitutional: Positive for fatigue. Negative for chills and fever.  HENT: Positive for congestion. Negative for sinus pain, sinus pressure, sneezing and sore throat.   Respiratory: Positive for cough and shortness of breath.   Cardiovascular: Negative for chest pain.      Objective:    BP 130/82 (BP Location: Left Arm, Patient Position: Sitting, Cuff Size:  Large)   Pulse 75   Temp 98 F (36.7 C) (Oral)   Resp 16   Ht 5\' 10"  (1.778 m)   Wt 192 lb 12.8 oz (87.5 kg)   SpO2 94%   BMI 27.66 kg/m  Nursing note and vital signs reviewed.  Physical Exam  Constitutional: He is oriented to person, place, and time. He appears well-developed and well-nourished. No distress.  HENT:  Right Ear: Hearing, tympanic membrane, external ear and ear canal normal.  Left Ear: Hearing, tympanic membrane, external ear and ear canal normal.  Nose: Nose normal.  Mouth/Throat: Uvula is midline, oropharynx is clear and moist and mucous membranes are normal.  Cardiovascular: Normal rate, regular rhythm, normal heart sounds and intact distal pulses.   Pulmonary/Chest: Effort normal and breath sounds normal. No respiratory distress. He has no wheezes. He has no rales. He exhibits no tenderness.  Neurological: He is alert and oriented to person, place, and time.  Skin: Skin is warm and dry.  Psychiatric: He has Thompson normal mood and affect. His behavior is normal. Judgment and thought content normal.       Assessment & Plan:   Problem List Items Addressed This Visit      Respiratory   Acute bronchitis    Symptoms and exam consistent with acute bronchitis. Start azithromycin. Continue over-the-counter medications as needed for symptom relief and supportive care. Follow-up if symptoms worsen or do not improve.          I am having [name] start on azithromycin. I am also having him maintain his calcium carbonate, ibuprofen, Octreotide Acetate (SANDOSTATIN IJ), sildenafil, docusate sodium, acetaminophen, traMADol, aspirin EC, clopidogrel, metoprolol tartrate, clindamycin, oxyCODONE, simvastatin, and losartan.   Meds ordered this encounter  Medications  . azithromycin (ZITHROMAX) 500 MG tablet    Sig: Take 1 tablet (500 mg total) by mouth daily.    Dispense:  5 tablet    Refill:  0    Order Specific Question:   Supervising Provider    Answer:   Ronald Thompson [[id number]]     Follow-up: Return if symptoms worsen or fail to improve.  Ronald Po, FNP

## 2016-10-21 NOTE — Assessment & Plan Note (Signed)
Symptoms and exam consistent with acute bronchitis. Start azithromycin. Continue over-the-counter medications as needed for symptom relief and supportive care. Follow-up if symptoms worsen or do not improve.

## 2016-10-21 NOTE — Patient Instructions (Signed)
Thank you for choosing [facility].  SUMMARY AND INSTRUCTIONS:  Medication:  Your prescription(s) have been submitted to your pharmacy or been printed and provided for you. Please take as directed and contact our office if you believe you are having problem(s) with the medication(s) or have any questions.  Labs:  Please stop by the lab on the lower level of the building for your blood work. Your results will be released to [facility] (or called to you) after review, usually within 72 hours after test completion. If any changes need to be made, you will be notified at that same time.  1.) The lab is open from 7:30am to 5:30 pm Monday-Friday 2.) No appointment is necessary 3.) Fasting (if needed) is 6-8 hours after food and drink; black coffee and water are okay   Imaging / Radiology:  Please stop by radiology on the basement level of the building for your x-rays. Your results will be released to [facility] (or called to you) after review, usually within 72 hours after test completion. If any treatments or changes are necessary, you will be notified at that same time.  Referrals:  Referrals have been made during this visit. You should expect to hear back from our schedulers in about 7-10 days in regards to establishing an appointment with the specialists we discussed.   Follow up:  If your symptoms worsen or fail to improve, please contact our office for further instruction, or in case of emergency go directly to the emergency room at the closest medical facility.    General Recommendations:    Please drink plenty of fluids.  Get plenty of rest   Sleep in humidified air  Use saline nasal sprays  Netti pot   OTC Medications:  Decongestants - helps relieve congestion   Flonase (generic fluticasone) or Nasacort (generic triamcinolone) - please make sure to use the "cross-over" technique at a 45 degree angle towards the opposite eye as opposed to straight up the nasal  passageway.   Sudafed (generic pseudoephedrine - Note this is the one that is available behind the pharmacy counter); Products with phenylephrine (-PE) may also be used but is often not as effective as pseudoephedrine.   If you have HIGH BLOOD PRESSURE - Coricidin HBP; AVOID any product that is -D as this contains pseudoephedrine which may increase your blood pressure.  Afrin (oxymetazoline) every 6-8 hours for up to 3 days.   Allergies - helps relieve runny nose, itchy eyes and sneezing   Claritin (generic loratidine), Allegra (fexofenidine), or Zyrtec (generic cyrterizine) for runny nose. These medications should not cause drowsiness.  Note - Benadryl (generic diphenhydramine) may be used however may cause drowsiness  Cough -   Delsym or Robitussin (generic dextromethorphan)  Expectorants - helps loosen mucus to ease removal   Mucinex (generic guaifenesin) as directed on the package.  Headaches / General Aches   Tylenol (generic acetaminophen) - DO NOT EXCEED 3 grams (3,000 mg) in a 24 hour time period  Advil/Motrin (generic ibuprofen)   Sore Throat -   Salt water gargle   Chloraseptic (generic benzocaine) spray or lozenges / Sucrets (generic dyclonine)     Acute Bronchitis, Adult Acute bronchitis is when air tubes (bronchi) in the lungs suddenly get swollen. The condition can make it hard to breathe. It can also cause these symptoms:  A cough.  Coughing up clear, yellow, or green mucus.  Wheezing.  Chest congestion.  Shortness of breath.  A fever.  Body aches.  Chills.  A sore throat. Follow these instructions at home: Medicines   Take over-the-counter and prescription medicines only as told by your doctor.  If you were prescribed an antibiotic medicine, take it as told by your doctor. Do not stop taking the antibiotic even if you start to feel better. General instructions   Rest.  Drink enough fluids to keep your pee (urine) clear or pale  yellow.  Avoid smoking and secondhand smoke. If you smoke and you need help quitting, ask your doctor. Quitting will help your lungs heal faster.  Use an inhaler, cool mist vaporizer, or humidifier as told by your doctor.  Keep all follow-up visits as told by your doctor. This is important. How is this prevented? To lower your risk of getting this condition again:  Wash your hands often with soap and water. If you cannot use soap and water, use hand sanitizer.  Avoid contact with people who have cold symptoms.  Try not to touch your hands to your mouth, nose, or eyes.  Make sure to get the flu shot every year. Contact a doctor if:  Your symptoms do not get better in 2 weeks. Get help right away if:  You cough up blood.  You have chest pain.  You have very bad shortness of breath.  You become dehydrated.  You faint (pass out) or keep feeling like you are going to pass out.  You keep throwing up (vomiting).  You have a very bad headache.  Your fever or chills gets worse. This information is not intended to replace advice given to you by your health care provider. Make sure you discuss any questions you have with your health care provider. Document Released: 01/08/2008 Document Revised: 02/28/2016 Document Reviewed: 01/10/2016 Elsevier Interactive Patient Education  2017 [employer].

## 2016-10-22 ENCOUNTER — Telehealth: Payer: Self-pay | Admitting: Family

## 2016-10-22 ENCOUNTER — Telehealth (HOSPITAL_COMMUNITY): Payer: Self-pay | Admitting: *Deleted

## 2016-10-22 ENCOUNTER — Telehealth: Payer: Self-pay | Admitting: Internal Medicine

## 2016-10-22 MED ORDER — PROMETHAZINE-CODEINE 6.25-10 MG/5ML PO SYRP: 5.0000 mL | ORAL_SOLUTION | Freq: Four times a day (QID) | ORAL | 0 refills | Status: DC | PRN

## 2016-10-22 NOTE — Telephone Encounter (Signed)
-----   Message from Ladell Pier, MD sent at 10/21/2016  5:53 PM EDT ----- Regarding: RE: Ok to participate in [place] from oncology standpoint ----- Message ----- From: Rowe Pavy, RN Sent: 10/21/2016   2:24 PM To: Ladell Pier, MD Subject: Ok to participate in Cardiac Rehab             Dr. [name],  The above pt is eligible to participate in group exercise at Cardiac Rehab s/p TAVR.  May pt participate in a group setting? If so, any restrictions from an oncology viewpoint? Tentatively scheduled for 4/24 for orientation with exercise 4/30 pending your approval.  Thanks for your input [employer] RN

## 2016-10-22 NOTE — Telephone Encounter (Signed)
Routing to greg, please advise, I will call patient back, thanks

## 2016-10-22 NOTE — Telephone Encounter (Signed)
Patient called back stating he is having difficulty breathing at night due to the congestion and scar tissue he has in his lungs (from cancer).  Patient feels like the congestion is causing him to have a drowning feeling.  Patient states he would like maybe a asthma inhaler or something that he can use tonight to help that area.  Patient has made an appointment with Dr. Jenny Reichmann for tomorrow afternoon to follow up in regard to if he needs to treat this issue year round or just during allergy season.

## 2016-10-22 NOTE — Telephone Encounter (Signed)
Pt was seen yesterday by Marya Amsler and dx with bronchitis.  Pt is calling stating that he coughed all night and usually the azithromycin (ZITHROMAX) 500 MG tablet helps him out and he has never been where he couldn't lay down without being forced to cough to clear the phlegm in his throat.  He is asking what can he do?

## 2016-10-22 NOTE — Telephone Encounter (Signed)
Medication printed to be faxed.  

## 2016-10-23 ENCOUNTER — Ambulatory Visit: Payer: Medicare Other | Admitting: Internal Medicine

## 2016-10-23 NOTE — Telephone Encounter (Signed)
Patient called back stating he is feeling much better today and cancelled appointment with Dr. Jenny Reichmann for this evening.

## 2016-10-23 NOTE — Telephone Encounter (Signed)
Noted  

## 2016-10-30 ENCOUNTER — Ambulatory Visit (HOSPITAL_BASED_OUTPATIENT_CLINIC_OR_DEPARTMENT_OTHER): Payer: Medicare Other

## 2016-10-30 ENCOUNTER — Telehealth: Payer: Self-pay | Admitting: Oncology

## 2016-10-30 ENCOUNTER — Ambulatory Visit (HOSPITAL_BASED_OUTPATIENT_CLINIC_OR_DEPARTMENT_OTHER): Payer: Medicare Other | Admitting: Oncology

## 2016-10-30 VITALS — BP 129/75 | HR 81 | Temp 97.7°F | Resp 18 | Ht 70.0 in | Wt 186.1 lb

## 2016-10-30 DIAGNOSIS — D3A8 Other benign neuroendocrine tumors: Secondary | ICD-10-CM

## 2016-10-30 DIAGNOSIS — C7B8 Other secondary neuroendocrine tumors: Secondary | ICD-10-CM | POA: Diagnosis not present

## 2016-10-30 DIAGNOSIS — C7A8 Other malignant neuroendocrine tumors: Secondary | ICD-10-CM

## 2016-10-30 MED ORDER — OCTREOTIDE ACETATE 30 MG IM KIT
30.0000 mg | PACK | Freq: Once | INTRAMUSCULAR | Status: AC
  Administered 2016-10-30: 30 mg via INTRAMUSCULAR
  Filled 2016-10-30: qty 1

## 2016-10-30 NOTE — Progress Notes (Signed)
  Ronald Thompson OFFICE PROGRESS NOTE   Diagnosis: Pancreas neuroendocrine tumor  INTERVAL HISTORY:   Ronald Thompson returns as scheduled. He reports developing "bronchitis "several weeks ago. He was treated with azithromycin. He had a nonproductive cough. This has improved. He continues monthly Sandostatin.  Objective:  Vital signs in last 24 hours:  Blood pressure 129/75, pulse 81, temperature 97.7 F (36.5 C), temperature source Oral, resp. rate 18, height 5\' 10"  (1.778 m), weight 186 lb 1.6 oz (84.4 kg), SpO2 97 %.    Resp: Diminished breath sounds throughout the left chest, no respiratory distress Cardio: Regular rate and rhythm, 2/6 diastolic murmur GI: No hepatosplenomegaly, no mass, nontender Vascular: No leg edema  Medications: I have reviewed the patient's current medications.  Assessment/Plan: 1. Pancreatic neuroendocrine tumor, WHO grade 2, pancreatic head mass and small peripancreatic/celiac nodes on a CT 02/04/2014  EUS revealed evidence of multiple liver metastases-status post an FNA biopsy of a left liver lesion 02/10/2014 confirming a neuroendocrine tumor   Octreotide scan 04/01/2014 with multiple foci of metastatic neuroendocrine tumor in the liver  Monthly Sandostatin started 03/16/2014  Restaging CT 07/04/2014 with resolution of a previously noted pancreas head lesion and probable progression of liver metastases  Restaging CT 10/31/2014-stable liver lesions, no evidence of a pancreas mass, stable.  Restaging CT 02/28/2015-stable hepatic metastases, stable pancreas lesion unchanged  Restaging CT 08/30/2015-stable pancreas mass, slight enlargement of hepatic metastases  Monthly Sandostatin continued  Restaging CTs06/14/2017 revealed enlargement of liver lesions, no new lesions, enlargement of the pancreas head mass  Gallium DOTATATE scan 10/01/2016 confirmed uptake in the liver metastases, pancreas primary, peripancreatic adenopathy, and left  iliac bone  2. Chest Seminoma in 1990 treated with BEP and chest radiation at [facility] 3. 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 chronic exertional dyspnea following treatment for the seminoma  5. aortic stenosis on an echocardiogram December 2011, severe aortic stenosis on echocardiogram 12/06/2015, status postTAVR on 04/16/2016 6. lumbar disc surgery 2014  7. acute abdominal pain 02/04/2014-resolved      Disposition: [name] appears unchanged. He will continue monthly Sandostatin. He is waiting on availability of Lutathera. He will return for Sandostatin in one month and an office visit in 2 months. We will contact him sooner if the Lutathera becomes available.  15 minutes were spent with the patient today. The majority of the time was used for counseling and coordination of care.    Betsy Coder, MD  10/30/2016  10:03 AM

## 2016-10-30 NOTE — Telephone Encounter (Signed)
Gave patient avs report and appointments for April and May.  °

## 2016-11-08 ENCOUNTER — Other Ambulatory Visit: Payer: Self-pay

## 2016-11-13 ENCOUNTER — Other Ambulatory Visit: Payer: Self-pay

## 2016-11-13 DIAGNOSIS — Z952 Presence of prosthetic heart valve: Secondary | ICD-10-CM

## 2016-11-13 DIAGNOSIS — I350 Nonrheumatic aortic (valve) stenosis: Secondary | ICD-10-CM

## 2016-11-26 ENCOUNTER — Encounter (HOSPITAL_COMMUNITY)
Admission: RE | Admit: 2016-11-26 | Discharge: 2016-11-26 | Disposition: A | Discharge status: Home / Self Care | Payer: Medicare Other | Source: Ambulatory Visit | Attending: Cardiovascular Disease | Admitting: Cardiovascular Disease

## 2016-11-26 ENCOUNTER — Encounter (HOSPITAL_COMMUNITY): Payer: Self-pay

## 2016-11-26 VITALS — BP 124/84 | HR 70 | Ht 69.5 in | Wt 187.4 lb

## 2016-11-26 DIAGNOSIS — Z952 Presence of prosthetic heart valve: Secondary | ICD-10-CM | POA: Diagnosis not present

## 2016-11-26 NOTE — Progress Notes (Signed)
Cardiac Rehab Medication Review by a Pharmacist  Does the patient  feel that his/her medications are working for him/her?  yes  Has the patient been experiencing any side effects to the medications prescribed?  no  Does the patient measure his/her own blood pressure or blood glucose at home?  yes   Does the patient have any problems obtaining medications due to transportation or finances?   no  Understanding of regimen: excellent Understanding of indications: excellent Potential of compliance: excellent   Pharmacist comments: Ronald Thompson presents today in good spirits. He has stage IV neuroendocrine cancer of the pancreas, for which he currently receives somatostatin injections monthly. He is scheduled to start a newer generation analog (lanreotide) in May at [facility]. Regarding his other medications, he is extremely compliant and well informed. He carries a binder with all of his medical records and is happy to be here. His only complaint is fatigue, but cardiac rehab should help with this.    Dierdre Harness, [place], PharmD Clinical Pharmacy Resident [phone] (Pager) 11/26/2016 9:43 AM

## 2016-11-26 NOTE — Progress Notes (Signed)
Cardiac Individual Treatment Plan  Patient Details  Name: Ronald Thompson MRN: [id number] Date of Birth: [birth year] Referring Provider:     [facility] from 11/26/2016 in [place]  Referring Provider  [name], MD.      Initial Encounter Date:    CARDIAC REHAB PHASE II ORIENTATION from 11/26/2016 in [place]  Date  11/26/16  Referring Provider  [name], MD.      Visit Diagnosis: 04/16/16  S/P TAVR (transcatheter aortic valve replacement)  Patient's Home Medications on Admission:  Current Outpatient Prescriptions:  .  acetaminophen (TYLENOL) 500 MG tablet, Take 1,000 mg by mouth every 6 (six) hours as needed for headache., Disp: , Rfl:  .  aspirin EC 81 MG tablet, Take 81 mg by mouth every evening., Disp: , Rfl:  .  clindamycin (CLEOCIN) 300 MG capsule, Take 2 capsules by mouth one hour prior to dental appointment (Patient taking differently: Take 600 mg by mouth as needed (dental procedures). Take 2 capsules by mouth one hour prior to dental appointment), Disp: 6 capsule, Rfl: 2 .  docusate sodium (COLACE) 100 MG capsule, Take 100 mg by mouth daily as needed for mild constipation., Disp: , Rfl:  .  ibuprofen (ADVIL,MOTRIN) 200 MG tablet, Take 400 mg by mouth every 6 (six) hours as needed for headache or mild pain. , Disp: , Rfl:  .  losartan (COZAAR) 50 MG tablet, Take 1 tablet (50 mg total) by mouth daily. Yearly physical due in April must see Md for future refills, Disp: 30 tablet, Rfl: 0 .  metoprolol tartrate (LOPRESSOR) 25 MG tablet, Take 0.5 tablets (12.5 mg total) by mouth 2 (two) times daily., Disp: 60 tablet, Rfl: 11 .  Octreotide Acetate (SANDOSTATIN IJ), Inject 1 each as directed every 30 (thirty) days. , Disp: , Rfl:  .  simvastatin (ZOCOR) 20 MG tablet, Take 1 tablet (20 mg total) by mouth daily at 6 PM. Yearly physical due in April must see MD for refills, Disp: 90 tablet,  Rfl: 0 .  azithromycin (ZITHROMAX) 500 MG tablet, Take 1 tablet (500 mg total) by mouth daily. (Patient not taking: Reported on 11/26/2016), Disp: 5 tablet, Rfl: 0 .  calcium carbonate (TUMS - DOSED IN MG ELEMENTAL CALCIUM) 500 MG chewable tablet, Chew 3 tablets by mouth 3 (three) times daily as needed for indigestion or heartburn. , Disp: , Rfl:  .  oxyCODONE (OXY IR/ROXICODONE) 5 MG immediate release tablet, 1-2 tabs PO Q 6 hours PRN pain. (Patient not taking: Reported on 11/26/2016), Disp: 45 tablet, Rfl: 0 .  promethazine-codeine (PHENERGAN WITH CODEINE) 6.25-10 MG/5ML syrup, Take 5 mLs by mouth every 6 (six) hours as needed. (Patient not taking: Reported on 11/26/2016), Disp: 180 mL, Rfl: 0 .  sildenafil (REVATIO) 20 MG tablet, Take 20 mg by mouth daily as needed (for ED). , Disp: , Rfl:  .  traMADol (ULTRAM) 50 MG tablet, Take 1 tablet (50 mg total) by mouth every 4 (four) hours as needed for moderate pain. (Patient not taking: Reported on 11/26/2016), Disp: 28 tablet, Rfl: 0  Past Medical History: Past Medical History:  Diagnosis Date  . Baker's cyst   . Gallstones   . Heart murmur   . Hypercholesteremia   . Hypertension   . Lesion of left lung    hx.25 yrs ago- testicilar cancer related- only scarring left after tx.-no problems now  . Liver metastasis ([place])   . 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  . Pericarditis    2nd to tumor  . Pneumonia 1990  . Primary neuroendocrine tumor of pancreas 02/14/2014   Stage IV with multiple mets to liver  . Pulmonary fibrosis ([place]) 11/08/2015  . S/P TAVR (transcatheter aortic valve replacement) 04/16/2016   26 mm Edwards Sapien 3 transcatheter heart valve placed via percutaneous right transfemoral approach   . Shortness of breath dyspnea    with exertion and when tired  . 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 Transfusion history    hx. 25 yrs ago-during cancer tx.    Tobacco Use: History  Smoking Status  . Never Smoker  Smokeless Tobacco  . Never Used    Labs: Recent Review Flowsheet Data    Labs for ITP Cardiac and Pulmonary Rehab Latest Ref Rng & Units 04/16/2016 04/16/2016 04/16/2016 04/16/2016 04/16/2016   Cholestrol 0 - 200 mg/dL - - - - -   LDLCALC 0 - 99 mg/dL - - - - -   LDLDIRECT mg/dL - - - - -   HDL >39.00 mg/dL - - - - -   Trlycerides 0.0 - 149.0 mg/dL - - - - -   Hemoglobin A1c 4.8 - 5.6 % - - - - -   PHART 7.350 - 7.450 - 7.367 - - 7.345(L)   PCO2ART 32.0 - 48.0 mmHg - 53.1(H) - - 54.9(H)   HCO3 20.0 - 28.0 mmol/L - 30.4(H) - - 30.0(H)   TCO2 0 - 100 mmol/L 30 32 30 29 32   O2SAT % - 100.0 - - 96.0      Capillary Blood Glucose: No results found for: GLUCAP   Exercise Target Goals: Date: 11/26/16  Exercise Program Goal: Individual exercise prescription set with THRR, safety & activity barriers. Participant demonstrates ability to understand and report RPE using BORG scale, to self-measure pulse accurately, and to acknowledge the importance of the exercise prescription.  Exercise Prescription Goal: Starting with aerobic activity 30 plus minutes a day, 3 days per week for initial exercise prescription. Provide home exercise prescription and guidelines that participant acknowledges understanding prior to discharge.  Activity Barriers & Risk Stratification:     Activity Barriers & Cardiac Risk Stratification - 11/26/16 0855      Activity Barriers & Cardiac Risk Stratification   Activity Barriers None   Cardiac Risk Stratification High      6 Minute Walk:     6 Minute Walk    Row Name 11/26/16 1415         6 Minute Walk   Phase Initial     Distance 1843 feet     Walk Time 6 minutes     # of Rest Breaks 0     MPH 3.49     METS 4.41     RPE 11     VO2 Peak 15.43     Symptoms No     Resting HR 70 bpm     Resting  BP 124/84     Max Ex. HR 104 bpm     Max Ex. BP 142/94     2 Minute Post BP 142/78        Oxygen Initial Assessment:   Oxygen Re-Evaluation:   Oxygen Discharge (Final Oxygen Re-Evaluation):   Initial Exercise Prescription:     Initial Exercise Prescription -  11/26/16 1400      Date of Initial Exercise RX and Referring Provider   Date 11/26/16   Referring Provider [name], MD.     Treadmill   MPH 2.4   Grade 2   Minutes 10   METs 3.5     Bike   Level 1.5   Minutes 10   METs 4.33     NuStep   Level 4   SPM 90   Minutes 10   METs 3     Prescription Details   Frequency (times per week) 3   Duration Progress to 30 minutes of continuous aerobic without signs/symptoms of physical distress     Intensity   THRR 40-80% of Max Heartrate 63-126   Ratings of Perceived Exertion 11-13   Perceived Dyspnea 0-4     Progression   Progression Continue to progress workloads to maintain intensity without signs/symptoms of physical distress.     Resistance Training   Training Prescription Yes   Weight 5lb   Reps 10-15      Perform Capillary Blood Glucose checks as needed.  Exercise Prescription Changes:   Exercise Comments:   Exercise Goals and Review:     Exercise Goals    Row Name 11/26/16 0850             Exercise Goals   Increase Physical Activity Yes       Intervention Provide advice, education, support and counseling about physical activity/exercise needs.;Develop an individualized exercise prescription for aerobic and resistive training based on initial evaluation findings, risk stratification, comorbidities and participant's personal goals.       Expected Outcomes Achievement of increased cardiorespiratory fitness and enhanced flexibility, muscular endurance and strength shown through measurements of functional capacity and personal statement of participant.       Increase Strength and Stamina Yes  Be able to climb stairs; improve overall  cardiorespiratory fitness       Intervention Provide advice, education, support and counseling about physical activity/exercise needs.;Develop an individualized exercise prescription for aerobic and resistive training based on initial evaluation findings, risk stratification, comorbidities and participant's personal goals.       Expected Outcomes Achievement of increased cardiorespiratory fitness and enhanced flexibility, muscular endurance and strength shown through measurements of functional capacity and personal statement of participant.          Exercise Goals Re-Evaluation :    Discharge Exercise Prescription (Final Exercise Prescription Changes):   Nutrition:  Target Goals: Understanding of nutrition guidelines, daily intake of sodium 1500mg , cholesterol 200mg , calories 30% from fat and 7% or less from saturated fats, daily to have 5 or more servings of fruits and vegetables.  Biometrics:     Pre Biometrics - 11/26/16 1423      Pre Biometrics   Height 5' 9.5" (1.765 m)   Weight 187 lb 6.3 oz (85 kg)   Waist Circumference 36.75 inches   Hip Circumference 41 inches   Waist to Hip Ratio 0.9 %   BMI (Calculated) 27.3   Triceps Skinfold 15 mm   % Body Fat 25.5 %   Grip Strength 50 kg   Flexibility 7.5 in   Single Leg Stand 19.84 seconds       Nutrition Therapy Plan and Nutrition Goals:   Nutrition Discharge: Nutrition Scores:   Nutrition Goals Re-Evaluation:   Nutrition Goals Re-Evaluation:   Nutrition Goals Discharge (Final Nutrition Goals Re-Evaluation):   Psychosocial: Target Goals: Acknowledge presence or absence of significant depression and/or stress, maximize  coping skills, provide positive support system. Participant is able to verbalize types and ability to use techniques and skills needed for reducing stress and depression.  Initial Review & Psychosocial Screening:     Initial Psych Review & Screening - 11/26/16 1705      Initial Review    Current issues with None Identified     Family Dynamics   Good Support System? Yes     Barriers   Psychosocial barriers to participate in program There are no identifiable barriers or psychosocial needs.     Screening Interventions   Interventions Encouraged to exercise      Quality of Life Scores:     Quality of Life - 11/26/16 0955      Quality of Life Scores   Health/Function Pre 14.43 %   Socioeconomic Pre 24.43 %   Psych/Spiritual Pre 21.21 %   Family Pre 22.8 %   GLOBAL Pre 19.12 %      PHQ-9: Recent Review Flowsheet Data    Depression screen [facility] 2/9 02/03/2014   Decreased Interest 0   Down, Depressed, Hopeless 0   PHQ - 2 Score 0     Interpretation of Total Score  Total Score Depression Severity:  1-4 = Minimal depression, 5-9 = Mild depression, 10-14 = Moderate depression, 15-19 = Moderately severe depression, 20-27 = Severe depression   Psychosocial Evaluation and Intervention:   Psychosocial Re-Evaluation:   Psychosocial Discharge (Final Psychosocial Re-Evaluation):   Vocational Rehabilitation: Provide vocational rehab assistance to qualifying candidates.   Vocational Rehab Evaluation & Intervention:     Vocational Rehab - 11/26/16 1704      Initial Vocational Rehab Evaluation & Intervention   Assessment shows need for Vocational Rehabilitation No      Education: Education Goals: Education classes will be provided on a weekly basis, covering required topics. Participant will state understanding/return demonstration of topics presented.  Learning Barriers/Preferences:     Learning Barriers/Preferences - 11/26/16 0849      Learning Barriers/Preferences   Learning Barriers Sight   Learning Preferences Written Material      Education Topics: Count Your Pulse:  -Group instruction provided by verbal instruction, demonstration, patient participation and written materials to support subject.  Instructors address importance of being able to  find your pulse and how to count your pulse when at home without a heart monitor.  Patients get hands on experience counting their pulse with staff help and individually.   Heart Attack, Angina, and Risk Factor Modification:  -Group instruction provided by verbal instruction, video, and written materials to support subject.  Instructors address signs and symptoms of angina and heart attacks.    Also discuss risk factors for heart disease and how to make changes to improve heart health risk factors.   Functional Fitness:  -Group instruction provided by verbal instruction, demonstration, patient participation, and written materials to support subject.  Instructors address safety measures for doing things around the house.  Discuss how to get up and down off the floor, how to pick things up properly, how to safely get out of a chair without assistance, and balance training.   Meditation and Mindfulness:  -Group instruction provided by verbal instruction, patient participation, and written materials to support subject.  Instructor addresses importance of mindfulness and meditation practice to help reduce stress and improve awareness.  Instructor also leads participants through a meditation exercise.    Stretching for Flexibility and Mobility:  -Group instruction provided by verbal instruction, patient participation, and written materials  to support subject.  Instructors lead participants through series of stretches that are designed to increase flexibility thus improving mobility.  These stretches are additional exercise for major muscle groups that are typically performed during regular warm up and cool down.   Hands Only CPR Anytime:  -Group instruction provided by verbal instruction, video, patient participation and written materials to support subject.  Instructors co-teach with AHA video for hands only CPR.  Participants get hands on experience with mannequins.   Nutrition I class: Heart  Healthy Eating:  -Group instruction provided by PowerPoint slides, verbal discussion, and written materials to support subject matter. The instructor gives an explanation and review of the Therapeutic Lifestyle Changes diet recommendations, which includes a discussion on lipid goals, dietary fat, sodium, fiber, plant stanol/sterol esters, sugar, and the components of a well-balanced, healthy diet.   Nutrition II class: Lifestyle Skills:  -Group instruction provided by PowerPoint slides, verbal discussion, and written materials to support subject matter. The instructor gives an explanation and review of label reading, grocery shopping for heart health, heart healthy recipe modifications, and ways to make healthier choices when eating out.   Diabetes Question & Answer:  -Group instruction provided by PowerPoint slides, verbal discussion, and written materials to support subject matter. The instructor gives an explanation and review of diabetes co-morbidities, pre- and post-prandial blood glucose goals, pre-exercise blood glucose goals, signs, symptoms, and treatment of hypoglycemia and hyperglycemia, and foot care basics.   Diabetes Blitz:  -Group instruction provided by PowerPoint slides, verbal discussion, and written materials to support subject matter. The instructor gives an explanation and review of the physiology behind type 1 and type 2 diabetes, diabetes medications and rational behind using different medications, pre- and post-prandial blood glucose recommendations and Hemoglobin A1c goals, diabetes diet, and exercise including blood glucose guidelines for exercising safely.    Portion Distortion:  -Group instruction provided by PowerPoint slides, verbal discussion, written materials, and food models to support subject matter. The instructor gives an explanation of serving size versus portion size, changes in portions sizes over the last 20 years, and what consists of a serving from each  food group.   Stress Management:  -Group instruction provided by verbal instruction, video, and written materials to support subject matter.  Instructors review role of stress in heart disease and how to cope with stress positively.     Exercising on Your Own:  -Group instruction provided by verbal instruction, power point, and written materials to support subject.  Instructors discuss benefits of exercise, components of exercise, frequency and intensity of exercise, and end points for exercise.  Also discuss use of nitroglycerin and activating EMS.  Review options of places to exercise outside of rehab.  Review guidelines for sex with heart disease.   Cardiac Drugs I:  -Group instruction provided by verbal instruction and written materials to support subject.  Instructor reviews cardiac drug classes: antiplatelets, anticoagulants, beta blockers, and statins.  Instructor discusses reasons, side effects, and lifestyle considerations for each drug class.   Cardiac Drugs II:  -Group instruction provided by verbal instruction and written materials to support subject.  Instructor reviews cardiac drug classes: angiotensin converting enzyme inhibitors (ACE-I), angiotensin II receptor blockers (ARBs), nitrates, and calcium channel blockers.  Instructor discusses reasons, side effects, and lifestyle considerations for each drug class.   Anatomy and Physiology of the Circulatory System:  -Group instruction provided by verbal instruction, video, and written materials to support subject.  Reviews functional anatomy of heart, how it relates to  various diagnoses, and what role the heart plays in the overall system.   Knowledge Questionnaire Score:     Knowledge Questionnaire Score - 11/26/16 0955      Knowledge Questionnaire Score   Pre Score 20/24      Core Components/Risk Factors/Patient Goals at Admission:     Personal Goals and Risk Factors at Admission - 11/26/16 1407      Core  Components/Risk Factors/Patient Goals on Admission   Hypertension Yes   Intervention Provide education on lifestyle modifcations including regular physical activity/exercise, weight management, moderate sodium restriction and increased consumption of fresh fruit, vegetables, and low fat dairy, alcohol moderation, and smoking cessation.;Monitor prescription use compliance.   Expected Outcomes Short Term: Continued assessment and intervention until BP is < 140/61mm HG in hypertensive participants. < 130/76mm HG in hypertensive participants with diabetes, heart failure or chronic kidney disease.;Long Term: Maintenance of blood pressure at goal levels.   Lipids Yes   Intervention Provide education and support for participant on nutrition & aerobic/resistive exercise along with prescribed medications to achieve LDL 70mg , HDL >40mg .   Expected Outcomes Short Term: Participant states understanding of desired cholesterol values and is compliant with medications prescribed. Participant is following exercise prescription and nutrition guidelines.;Long Term: Cholesterol controlled with medications as prescribed, with individualized exercise RX and with personalized nutrition plan. Value goals: LDL < 70mg , HDL > 40 mg.   Personal Goal Other Yes   Personal Goal Increase stamina: be able to climb stairs. Improve cardiorespiratory fitness.   Intervention Develop an individual exercise prescription for aerobic and resistive training based on evaluation findings, risk stratification, comorbidiites and participant's personal goals.   Expected Outcomes Achieve increased cardiorespiratory fitness and enhanced flexibilty, endurance and strength shown through measures of functional capacity and personal statements of participant.      Core Components/Risk Factors/Patient Goals Review:    Core Components/Risk Factors/Patient Goals at Discharge (Final Review):    ITP Comments:     ITP Comments    Row Name 11/26/16  0846           ITP Comments Medical Director, Dr. Fransico Him          Comments: Kari attended orientation from 0800 to 1015 to review rules and guidelines for program. Completed 6 minute walk test, Intitial ITP, and exercise prescription.  VSS. Telemetry-Sinus Rhtyhm.  Asymptomatic.Barnet Pall, RN,BSN 11/26/2016 5:11 PM

## 2016-11-28 ENCOUNTER — Ambulatory Visit (HOSPITAL_BASED_OUTPATIENT_CLINIC_OR_DEPARTMENT_OTHER): Payer: Medicare Other

## 2016-11-28 ENCOUNTER — Telehealth (HOSPITAL_COMMUNITY): Payer: Self-pay

## 2016-11-28 VITALS — BP 129/80 | HR 75 | Temp 97.5°F | Resp 18

## 2016-11-28 DIAGNOSIS — C7A8 Other malignant neuroendocrine tumors: Secondary | ICD-10-CM | POA: Diagnosis not present

## 2016-11-28 DIAGNOSIS — C7B8 Other secondary neuroendocrine tumors: Secondary | ICD-10-CM | POA: Diagnosis not present

## 2016-11-28 DIAGNOSIS — D3A8 Other benign neuroendocrine tumors: Secondary | ICD-10-CM

## 2016-11-28 MED ORDER — OCTREOTIDE ACETATE 30 MG IM KIT
30.0000 mg | PACK | Freq: Once | INTRAMUSCULAR | Status: AC
  Administered 2016-11-28: 30 mg via INTRAMUSCULAR
  Filled 2016-11-28: qty 1

## 2016-11-28 NOTE — Telephone Encounter (Signed)
-----   Message from [name], MD sent at 11/27/2016 10:21 PM EDT ----- He doesn't have any restrictions. thanks ----- Message ----- From: Lequita Halt Sent: 11/26/2016   2:23 PM To: [name], MD  Good afternoon Dr. Burt Knack, The listed pt. oriented today for phase II cardiac rehab. Pt is 6 months post procedure. Pt is back to weight lifting 3x/week. Pt is bench pressing 140lbs barbell, military press, bicep curls and upright rows with 85lb dumbbells each hand. Pt is also doing sit ups. Pt slowly progressed to the listed weights. Is this appropriate per the pt history and recent procedure (04/2016). Is there a max weight that the pt. can do at this time.    Thank you!   Alisia Vanengen [employer]

## 2016-11-28 NOTE — Patient Instructions (Addendum)
Influenza Virus Vaccine (Flucelvax) What is this medicine? INFLUENZA VIRUS VACCINE (in floo EN zuh VAHY ruhs vak SEEN) helps to reduce the risk of getting influenza also known as the flu. The vaccine only helps protect you against some strains of the flu. This medicine may be used for other purposes; ask your health care provider or pharmacist if you have questions. What should I tell my health care provider before I take this medicine? They need to know if you have any of these conditions: -bleeding disorder like hemophilia -fever or infection -Guillain-Barre syndrome or other neurological problems -immune system problems -infection with the human immunodeficiency virus (HIV) or AIDS -low blood platelet counts -multiple sclerosis -an unusual or allergic reaction to influenza virus vaccine, other medicines, foods, dyes or preservatives -pregnant or trying to get pregnant -breast-feeding How should I use this medicine? This vaccine is for injection into a muscle. It is given by a health care professional. A copy of Vaccine Information Statements will be given before each vaccination. Read this sheet carefully each time. The sheet may change frequently. Talk to your pediatrician regarding the use of this medicine in children. Special care may be needed. Overdosage: If you think you've taken too much of this medicine contact a poison control center or emergency room at once. Overdosage: If you think you have taken too much of this medicine contact a poison control center or emergency room at once. NOTE: This medicine is only for you. Do not share this medicine with others. What if I miss a dose? This does not apply. What may interact with this medicine? -chemotherapy or radiation therapy -medicines that lower your immune system like etanercept, anakinra, infliximab, and adalimumab -medicines that treat or prevent blood clots like warfarin -phenytoin -steroid medicines like prednisone or  cortisone -theophylline -vaccines This list may not describe all possible interactions. Give your health care provider a list of all the medicines, herbs, non-prescription drugs, or dietary supplements you use. Also tell them if you smoke, drink alcohol, or use illegal drugs. Some items may interact with your medicine. What should I watch for while using this medicine? Report any side effects that do not go away within 3 days to your doctor or health care professional. Call your health care provider if any unusual symptoms occur within 6 weeks of receiving this vaccine. You may still catch the flu, but the illness is not usually as bad. You cannot get the flu from the vaccine. The vaccine will not protect against colds or other illnesses that may cause fever. The vaccine is needed every year. What side effects may I notice from receiving this medicine? Side effects that you should report to your doctor or health care professional as soon as possible: -allergic reactions like skin rash, itching or hives, swelling of the face, lips, or tongue Side effects that usually do not require medical attention (Report these to your doctor or health care professional if they continue or are bothersome.): -fever -headache -muscle aches and pains -pain, tenderness, redness, or swelling at the injection site -tiredness This list may not describe all possible side effects. Call your doctor for medical advice about side effects. You may report side effects to FDA at 1-800-FDA-1088. Where should I keep my medicine? The vaccine will be given by a health care professional in a clinic, pharmacy, doctor's office, or other health care setting. You will not be given vaccine doses to store at home. NOTE: This sheet is a summary. It may not cover   all possible information. If you have questions about this medicine, talk to your doctor, pharmacist, or health care provider.    2016, Elsevier/Gold Standard. (2011-07-03  14:06:47) Octreotide injection solution What is this medicine? OCTREOTIDE (ok TREE oh tide) is used to reduce blood levels of growth hormone in patients with a condition called acromegaly. This medicine also reduces flushing and watery diarrhea caused by certain types of cancer. This medicine may be used for other purposes; ask your health care provider or pharmacist if you have questions. What should I tell my health care provider before I take this medicine? They need to know if you have any of these conditions: -gallbladder disease -kidney disease -liver disease -an unusual or allergic reaction to octreotide, other medicines, foods, dyes, or preservatives -pregnant or trying to get pregnant -breast-feeding How should I use this medicine? This medicine is for injection under the skin or into a vein (only in emergency situations). It is usually given by a health care professional in a hospital or clinic setting. If you get this medicine at home, you will be taught how to prepare and give this medicine. Allow the injection solution to come to room temperature before use. Do not warm it artificially. Use exactly as directed. Take your medicine at regular intervals. Do not take your medicine more often than directed. It is important that you put your used needles and syringes in a special sharps container. Do not put them in a trash can. If you do not have a sharps container, call your pharmacist or healthcare provider to get one. Talk to your pediatrician regarding the use of this medicine in children. Special care may be needed. Overdosage: If you think you have taken too much of this medicine contact a poison control center or emergency room at once. NOTE: This medicine is only for you. Do not share this medicine with others. What if I miss a dose? If you miss a dose, take it as soon as you can. If it is almost time for your next dose, take only that dose. Do not take double or extra  doses. What may interact with this medicine? Do not take this medicine with any of the following medications: -cisapride -droperidol -general anesthetics -grepafloxacin -perphenazine -thioridazine This medicine may also interact with the following medications: -bromocriptine -cyclosporine -diuretics -medicines for blood pressure, heart disease, irregular heart beat -medicines for diabetes, including insulin -quinidine This list may not describe all possible interactions. Give your health care provider a list of all the medicines, herbs, non-prescription drugs, or dietary supplements you use. Also tell them if you smoke, drink alcohol, or use illegal drugs. Some items may interact with your medicine. What should I watch for while using this medicine? Visit your doctor or health care professional for regular checks on your progress. To help reduce irritation at the injection site, use a different site for each injection and make sure the solution is at room temperature before use. This medicine may cause increases or decreases in blood sugar. Signs of high blood sugar include frequent urination, unusual thirst, flushed or dry skin, difficulty breathing, drowsiness, stomach ache, nausea, vomiting or dry mouth. Signs of low blood sugar include chills, cool, pale skin or cold sweats, drowsiness, extreme hunger, fast heartbeat, headache, nausea, nervousness or anxiety, shakiness, trembling, unsteadiness, tiredness, or weakness. Contact your doctor or health care professional right away if you experience any of these symptoms. What side effects may I notice from receiving this medicine? Side effects that you  should report to your doctor or health care professional as soon as possible: -allergic reactions like skin rash, itching or hives, swelling of the face, lips, or tongue -changes in blood sugar -changes in heart rate -severe stomach pain Side effects that usually do not require medical  attention (report to your doctor or health care professional if they continue or are bothersome): -diarrhea or constipation -gas or stomach pain -nausea, vomiting -pain, redness, swelling and irritation at site where injected This list may not describe all possible side effects. Call your doctor for medical advice about side effects. You may report side effects to FDA at 1-800-FDA-1088. Where should I keep my medicine? Keep out of the reach of children. Store in a refrigerator between 2 and 8 degrees C (36 and 46 degrees F). Protect from light. Allow to come to room temperature naturally. Do not use artificial heat. If protected from light, the injection may be stored at room temperature between 20 and 30 degrees C (70 and 86 degrees F) for 14 days. After the initial use, throw away any unused portion of a multiple dose vial after 14 days. Throw away unused portions of the ampules after use. NOTE: This sheet is a summary. It may not cover all possible information. If you have questions about this medicine, talk to your doctor, pharmacist, or health care provider.    2016, Elsevier/Gold Standard. (2008-02-16 16:56:04) Octreotide injection solution What is this medicine? OCTREOTIDE (ok TREE oh tide) is used to reduce blood levels of growth hormone in patients with a condition called acromegaly. This medicine also reduces flushing and watery diarrhea caused by certain types of cancer. This medicine may be used for other purposes; ask your health care provider or pharmacist if you have questions. What should I tell my health care provider before I take this medicine? They need to know if you have any of these conditions: -gallbladder disease -kidney disease -liver disease -an unusual or allergic reaction to octreotide, other medicines, foods, dyes, or preservatives -pregnant or trying to get pregnant -breast-feeding How should I use this medicine? This medicine is for injection under the skin  or into a vein (only in emergency situations). It is usually given by a health care professional in a hospital or clinic setting. If you get this medicine at home, you will be taught how to prepare and give this medicine. Allow the injection solution to come to room temperature before use. Do not warm it artificially. Use exactly as directed. Take your medicine at regular intervals. Do not take your medicine more often than directed. It is important that you put your used needles and syringes in a special sharps container. Do not put them in a trash can. If you do not have a sharps container, call your pharmacist or healthcare provider to get one. Talk to your pediatrician regarding the use of this medicine in children. Special care may be needed. Overdosage: If you think you have taken too much of this medicine contact a poison control center or emergency room at once. NOTE: This medicine is only for you. Do not share this medicine with others. What if I miss a dose? If you miss a dose, take it as soon as you can. If it is almost time for your next dose, take only that dose. Do not take double or extra doses. What may interact with this medicine? Do not take this medicine with any of the following medications: -cisapride -droperidol -general anesthetics -grepafloxacin -perphenazine -thioridazine This medicine  may also interact with the following medications: -bromocriptine -cyclosporine -diuretics -medicines for blood pressure, heart disease, irregular heart beat -medicines for diabetes, including insulin -quinidine This list may not describe all possible interactions. Give your health care provider a list of all the medicines, herbs, non-prescription drugs, or dietary supplements you use. Also tell them if you smoke, drink alcohol, or use illegal drugs. Some items may interact with your medicine. What should I watch for while using this medicine? Visit your doctor or health care  professional for regular checks on your progress. To help reduce irritation at the injection site, use a different site for each injection and make sure the solution is at room temperature before use. This medicine may cause increases or decreases in blood sugar. Signs of high blood sugar include frequent urination, unusual thirst, flushed or dry skin, difficulty breathing, drowsiness, stomach ache, nausea, vomiting or dry mouth. Signs of low blood sugar include chills, cool, pale skin or cold sweats, drowsiness, extreme hunger, fast heartbeat, headache, nausea, nervousness or anxiety, shakiness, trembling, unsteadiness, tiredness, or weakness. Contact your doctor or health care professional right away if you experience any of these symptoms. What side effects may I notice from receiving this medicine? Side effects that you should report to your doctor or health care professional as soon as possible: -allergic reactions like skin rash, itching or hives, swelling of the face, lips, or tongue -changes in blood sugar -changes in heart rate -severe stomach pain Side effects that usually do not require medical attention (report to your doctor or health care professional if they continue or are bothersome): -diarrhea or constipation -gas or stomach pain -nausea, vomiting -pain, redness, swelling and irritation at site where injected This list may not describe all possible side effects. Call your doctor for medical advice about side effects. You may report side effects to FDA at 1-800-FDA-1088. Where should I keep my medicine? Keep out of the reach of children. Store in a refrigerator between 2 and 8 degrees C (36 and 46 degrees F). Protect from light. Allow to come to room temperature naturally. Do not use artificial heat. If protected from light, the injection may be stored at room temperature between 20 and 30 degrees C (70 and 86 degrees F) for 14 days. After the initial use, throw away any unused  portion of a multiple dose vial after 14 days. Throw away unused portions of the ampules after use. NOTE: This sheet is a summary. It may not cover all possible information. If you have questions about this medicine, talk to your doctor, pharmacist, or health care provider.    2016, Elsevier/Gold Standard. (2008-02-16 16:56:04) Octreotide injection solution What is this medicine? OCTREOTIDE (ok TREE oh tide) is used to reduce blood levels of growth hormone in patients with a condition called acromegaly. This medicine also reduces flushing and watery diarrhea caused by certain types of cancer. This medicine may be used for other purposes; ask your health care provider or pharmacist if you have questions. COMMON BRAND NAME(S): Sandostatin, Sandostatin LAR What should I tell my health care provider before I take this medicine? They need to know if you have any of these conditions: -gallbladder disease -kidney disease -liver disease -an unusual or allergic reaction to octreotide, other medicines, foods, dyes, or preservatives -pregnant or trying to get pregnant -breast-feeding How should I use this medicine? This medicine is for injection under the skin or into a vein (only in emergency situations). It is usually given by a health care  professional in a hospital or clinic setting. If you get this medicine at home, you will be taught how to prepare and give this medicine. Allow the injection solution to come to room temperature before use. Do not warm it artificially. Use exactly as directed. Take your medicine at regular intervals. Do not take your medicine more often than directed. It is important that you put your used needles and syringes in a special sharps container. Do not put them in a trash can. If you do not have a sharps container, call your pharmacist or healthcare provider to get one. Talk to your pediatrician regarding the use of this medicine in children. Special care may be  needed. Overdosage: If you think you have taken too much of this medicine contact a poison control center or emergency room at once. NOTE: This medicine is only for you. Do not share this medicine with others. What if I miss a dose? If you miss a dose, take it as soon as you can. If it is almost time for your next dose, take only that dose. Do not take double or extra doses. What may interact with this medicine? Do not take this medicine with any of the following medications: -cisapride -droperidol -general anesthetics -grepafloxacin -perphenazine -thioridazine This medicine may also interact with the following medications: -bromocriptine -cyclosporine -diuretics -medicines for blood pressure, heart disease, irregular heart beat -medicines for diabetes, including insulin -quinidine This list may not describe all possible interactions. Give your health care provider a list of all the medicines, herbs, non-prescription drugs, or dietary supplements you use. Also tell them if you smoke, drink alcohol, or use illegal drugs. Some items may interact with your medicine. What should I watch for while using this medicine? Visit your doctor or health care professional for regular checks on your progress. To help reduce irritation at the injection site, use a different site for each injection and make sure the solution is at room temperature before use. This medicine may cause increases or decreases in blood sugar. Signs of high blood sugar include frequent urination, unusual thirst, flushed or dry skin, difficulty breathing, drowsiness, stomach ache, nausea, vomiting or dry mouth. Signs of low blood sugar include chills, cool, pale skin or cold sweats, drowsiness, extreme hunger, fast heartbeat, headache, nausea, nervousness or anxiety, shakiness, trembling, unsteadiness, tiredness, or weakness. Contact your doctor or health care professional right away if you experience any of these symptoms. What  side effects may I notice from receiving this medicine? Side effects that you should report to your doctor or health care professional as soon as possible: -allergic reactions like skin rash, itching or hives, swelling of the face, lips, or tongue -changes in blood sugar -changes in heart rate -severe stomach pain Side effects that usually do not require medical attention (report to your doctor or health care professional if they continue or are bothersome): -diarrhea or constipation -gas or stomach pain -nausea, vomiting -pain, redness, swelling and irritation at site where injected This list may not describe all possible side effects. Call your doctor for medical advice about side effects. You may report side effects to FDA at 1-800-FDA-1088. Where should I keep my medicine? Keep out of the reach of children. Store in a refrigerator between 2 and 8 degrees C (36 and 46 degrees F). Protect from light. Allow to come to room temperature naturally. Do not use artificial heat. If protected from light, the injection may be stored at room temperature between 20 and 30 degrees C (  70 and 86 degrees F) for 14 days. After the initial use, throw away any unused portion of a multiple dose vial after 14 days. Throw away unused portions of the ampules after use. NOTE: This sheet is a summary. It may not cover all possible information. If you have questions about this medicine, talk to your doctor, pharmacist, or health care provider.  2018 Elsevier/Gold Standard (2008-02-16 16:56:04)

## 2016-11-28 NOTE — Telephone Encounter (Signed)
-----   Message from [name], MD sent at 11/27/2016 10:21 PM EDT ----- He doesn't have any restrictions. thanks ----- Message ----- From: Lequita Halt Sent: 11/26/2016   2:23 PM To: [name], MD  Good afternoon Dr. Burt Knack, The listed pt. oriented today for phase II cardiac rehab. Pt is 6 months post procedure. Pt is back to weight lifting 3x/week. Pt is bench pressing 140lbs barbell, military press, bicep curls and upright rows with 85lb dumbbells each hand. Pt is also doing sit ups. Pt slowly progressed to the listed weights. Is this appropriate per the pt history and recent procedure (04/2016). Is there a max weight that the pt. can do at this time.    Thank you!   Braidyn Scorsone [employer]

## 2016-12-02 ENCOUNTER — Encounter (HOSPITAL_COMMUNITY): Payer: Self-pay

## 2016-12-02 ENCOUNTER — Encounter (HOSPITAL_COMMUNITY)
Admission: RE | Admit: 2016-12-02 | Discharge: 2016-12-02 | Disposition: A | Discharge status: Home / Self Care | Payer: Medicare Other | Source: Ambulatory Visit | Attending: Cardiovascular Disease | Admitting: Cardiovascular Disease

## 2016-12-02 DIAGNOSIS — Z952 Presence of prosthetic heart valve: Secondary | ICD-10-CM

## 2016-12-02 NOTE — Progress Notes (Signed)
Daily Session Note  Patient Details  Name: Ronald Thompson MRN: [id number] Date of Birth: Feb 12, 1956 Referring Provider:     CARDIAC REHAB PHASE II ORIENTATION from 11/26/2016 in [facility] [facility]  Referring Provider  [name], MD.      Encounter Date: 12/02/2016  Check In:     Session Check In - 12/02/16 0705      Check-In   Location [facility]   Staff Present Cleda Mccreedy, MS, Exercise Physiologist;Joann Rion, RN, BSN;Molly diVincenzo, MS, ACSM RCEP, Exercise Physiologist   Supervising physician immediately available to respond to emergencies [facility] immediately available   Physician(s) Dr. Posey Pronto    Medication changes reported     No   Fall or balance concerns reported    No   Tobacco Cessation No Change   Warm-up and Cool-down Performed as group-led instruction   Resistance Training Performed Yes   VAD Patient? No     Pain Assessment   Currently in Pain? No/denies      Capillary Blood Glucose: No results found for this or any previous visit (from the past 24 hour(s)).    History  Smoking Status  . Never Smoker  Smokeless Tobacco  . Never Used    Goals Met:  Exercise tolerated well  Goals Unmet:  Not Applicable  Comments: Pt started cardiac rehab today.  Pt tolerated light exercise without difficulty. VSS, telemetry-sinus rhythm,  asymptomatic.  Medication list reconciled. Pt denies barriers to medicaiton compliance.  PSYCHOSOCIAL ASSESSMENT:  PHQ-0. Marland Kitchen Pt exhibits positive coping skills, hopeful outlook with supportive family. No psychosocial needs identified at this time, no psychosocial interventions necessary.    Pt enjoys reading, gardening and spending time with his grandchildren ages infant-61 years old.  Pt goals for cardiac rehab are to increase his walking distance, climb hills without symptoms and possibly resume jogging.   Pt oriented to exercise equipment and routine.    Understanding  verbalized.   Dr. Fransico Him is Medical Director for Cardiac Rehab at [facility].

## 2016-12-04 ENCOUNTER — Encounter (HOSPITAL_COMMUNITY)
Admission: RE | Admit: 2016-12-04 | Discharge: 2016-12-04 | Disposition: A | Discharge status: Home / Self Care | Payer: Medicare Other | Source: Ambulatory Visit | Attending: Cardiovascular Disease | Admitting: Cardiovascular Disease

## 2016-12-04 DIAGNOSIS — Z952 Presence of prosthetic heart valve: Secondary | ICD-10-CM | POA: Diagnosis not present

## 2016-12-06 ENCOUNTER — Encounter (HOSPITAL_COMMUNITY): Payer: Medicare Other

## 2016-12-06 ENCOUNTER — Encounter: Payer: Self-pay | Admitting: *Deleted

## 2016-12-06 ENCOUNTER — Telehealth (HOSPITAL_COMMUNITY): Payer: Self-pay | Admitting: Cardiac Rehabilitation

## 2016-12-06 NOTE — Telephone Encounter (Signed)
pc received from pt he is absent from cardiac rehab today due to family illness out of town.  Plans to return at next scheduled session unless father in law condition unimproved or worsens.

## 2016-12-06 NOTE — Progress Notes (Signed)
Financial assistance application for [employer] given to [employer] per order of Dr. [name].

## 2016-12-09 ENCOUNTER — Encounter (HOSPITAL_COMMUNITY)
Admission: RE | Admit: 2016-12-09 | Discharge: 2016-12-09 | Disposition: A | Discharge status: Home / Self Care | Payer: Medicare Other | Source: Ambulatory Visit | Attending: Cardiovascular Disease | Admitting: Cardiovascular Disease

## 2016-12-09 DIAGNOSIS — Z952 Presence of prosthetic heart valve: Secondary | ICD-10-CM | POA: Diagnosis not present

## 2016-12-11 ENCOUNTER — Encounter: Payer: Self-pay | Admitting: Oncology

## 2016-12-11 ENCOUNTER — Encounter (HOSPITAL_COMMUNITY)
Admission: RE | Admit: 2016-12-11 | Discharge: 2016-12-11 | Disposition: A | Discharge status: Home / Self Care | Payer: Medicare Other | Source: Ambulatory Visit | Attending: Cardiovascular Disease | Admitting: Cardiovascular Disease

## 2016-12-11 DIAGNOSIS — Z952 Presence of prosthetic heart valve: Secondary | ICD-10-CM

## 2016-12-11 NOTE — Progress Notes (Signed)
Called Patient to discuss [place] Application/ income information/to sigh application. No answer, left message.   Application sent to Dr. [name] for signature.

## 2016-12-13 ENCOUNTER — Encounter (HOSPITAL_COMMUNITY)
Admission: RE | Admit: 2016-12-13 | Discharge: 2016-12-13 | Disposition: A | Discharge status: Home / Self Care | Payer: Medicare Other | Source: Ambulatory Visit | Attending: Cardiovascular Disease | Admitting: Cardiovascular Disease

## 2016-12-13 DIAGNOSIS — Z952 Presence of prosthetic heart valve: Secondary | ICD-10-CM | POA: Diagnosis not present

## 2016-12-16 ENCOUNTER — Encounter (HOSPITAL_COMMUNITY): Payer: Medicare Other

## 2016-12-18 ENCOUNTER — Telehealth (HOSPITAL_COMMUNITY): Payer: Self-pay | Admitting: Cardiac Rehabilitation

## 2016-12-18 ENCOUNTER — Encounter (HOSPITAL_COMMUNITY): Payer: Medicare Other

## 2016-12-18 NOTE — Telephone Encounter (Signed)
Pt returned call to report he is on vacation this week. He plans to return to rehab next week.

## 2016-12-18 NOTE — Telephone Encounter (Signed)
pc to assess reason for absence from cardiac rehab. LMOM

## 2016-12-19 ENCOUNTER — Encounter: Payer: Self-pay | Admitting: Oncology

## 2016-12-19 NOTE — Progress Notes (Signed)
Patient has Sport and exercise psychologist and therefor is not eligible for assistance the [place] financial assistance.

## 2016-12-20 ENCOUNTER — Encounter (HOSPITAL_COMMUNITY): Payer: Medicare Other

## 2016-12-23 ENCOUNTER — Ambulatory Visit: Payer: Medicare Other | Admitting: Cardiovascular Disease

## 2016-12-23 ENCOUNTER — Encounter (HOSPITAL_COMMUNITY)
Admission: RE | Admit: 2016-12-23 | Discharge: 2016-12-23 | Disposition: A | Discharge status: Home / Self Care | Payer: Medicare Other | Source: Ambulatory Visit | Attending: Cardiovascular Disease | Admitting: Cardiovascular Disease

## 2016-12-23 ENCOUNTER — Other Ambulatory Visit: Payer: Self-pay | Admitting: Internal Medicine

## 2016-12-23 DIAGNOSIS — Z952 Presence of prosthetic heart valve: Secondary | ICD-10-CM | POA: Diagnosis not present

## 2016-12-25 ENCOUNTER — Encounter (HOSPITAL_COMMUNITY)
Admission: RE | Admit: 2016-12-25 | Discharge: 2016-12-25 | Disposition: A | Discharge status: Home / Self Care | Payer: Medicare Other | Source: Ambulatory Visit | Attending: Cardiovascular Disease | Admitting: Cardiovascular Disease

## 2016-12-25 DIAGNOSIS — Z952 Presence of prosthetic heart valve: Secondary | ICD-10-CM

## 2016-12-25 NOTE — Progress Notes (Signed)
Cardiac Individual Treatment Plan  Patient Details  Name: Ronald Thompson MRN: [id number] Date of Birth: 07-10-1956 Referring Provider:     [facility] from 11/26/2016 in [place]  Referring Provider  [name], MD.      Initial Encounter Date:    CARDIAC REHAB PHASE II ORIENTATION from 11/26/2016 in [place]  Date  11/26/16  Referring Provider  [name], MD.      Visit Diagnosis: 04/16/16  S/P TAVR (transcatheter aortic valve replacement)  Patient's Home Medications on Admission:  Current Outpatient Prescriptions:  .  acetaminophen (TYLENOL) 500 MG tablet, Take 1,000 mg by mouth every 6 (six) hours as needed for headache., Disp: , Rfl:  .  aspirin EC 81 MG tablet, Take 81 mg by mouth every evening., Disp: , Rfl:  .  azithromycin (ZITHROMAX) 500 MG tablet, Take 1 tablet (500 mg total) by mouth daily. (Patient not taking: Reported on 12/02/2016), Disp: 5 tablet, Rfl: 0 .  calcium carbonate (TUMS - DOSED IN MG ELEMENTAL CALCIUM) 500 MG chewable tablet, Chew 3 tablets by mouth 3 (three) times daily as needed for indigestion or heartburn. , Disp: , Rfl:  .  clindamycin (CLEOCIN) 300 MG capsule, Take 2 capsules by mouth one hour prior to dental appointment (Patient taking differently: Take 600 mg by mouth as needed (dental procedures). Take 2 capsules by mouth one hour prior to dental appointment), Disp: 6 capsule, Rfl: 2 .  docusate sodium (COLACE) 100 MG capsule, Take 100 mg by mouth daily as needed for mild constipation., Disp: , Rfl:  .  ibuprofen (ADVIL,MOTRIN) 200 MG tablet, Take 400 mg by mouth every 6 (six) hours as needed for headache or mild pain. , Disp: , Rfl:  .  losartan (COZAAR) 50 MG tablet, TAKE ONE TABLET BY MOUTH DAILY. YEARLY PHYSICAL DUE IN [place], MUST SEE MD FOR FUTURE REFILLS., Disp: 30 tablet, Rfl: 0 .  metoprolol tartrate (LOPRESSOR) 25 MG tablet, Take 0.5 tablets (12.5 mg  total) by mouth 2 (two) times daily., Disp: 60 tablet, Rfl: 11 .  Octreotide Acetate (SANDOSTATIN IJ), Inject 1 each as directed every 30 (thirty) days. , Disp: , Rfl:  .  oxyCODONE (OXY IR/ROXICODONE) 5 MG immediate release tablet, 1-2 tabs PO Q 6 hours PRN pain. (Patient not taking: Reported on 12/02/2016), Disp: 45 tablet, Rfl: 0 .  promethazine-codeine (PHENERGAN WITH CODEINE) 6.25-10 MG/5ML syrup, Take 5 mLs by mouth every 6 (six) hours as needed. (Patient not taking: Reported on 12/02/2016), Disp: 180 mL, Rfl: 0 .  sildenafil (REVATIO) 20 MG tablet, Take 20 mg by mouth daily as needed (for ED). , Disp: , Rfl:  .  simvastatin (ZOCOR) 20 MG tablet, Take 1 tablet (20 mg total) by mouth daily at 6 PM. Yearly physical due in April must see MD for refills, Disp: 90 tablet, Rfl: 0 .  traMADol (ULTRAM) 50 MG tablet, Take 1 tablet (50 mg total) by mouth every 4 (four) hours as needed for moderate pain. (Patient not taking: Reported on 12/02/2016), Disp: 28 tablet, Rfl: 0  Past Medical History: Past Medical History:  Diagnosis Date  . Baker's cyst   . Gallstones   . Heart murmur   . Hypercholesteremia   . Hypertension   . Lesion of left lung    hx.25 yrs ago- testicilar cancer related- only scarring left after tx.-no problems now  . Liver metastasis ([place])   . Occlusion of left subclavian vein (  [place]) 1990   and Brachiocephalic- DVT   during chemo left subclavian are larger than right.  . Pericarditis    2nd to tumor  . Pneumonia 1990  . Primary neuroendocrine tumor of pancreas 02/14/2014   Stage IV with multiple mets to liver  . Pulmonary fibrosis ([place]) 11/08/2015  . S/P TAVR (transcatheter aortic valve replacement) 04/16/2016   26 mm Edwards Sapien 3 transcatheter heart valve placed via percutaneous right transfemoral approach   . Shortness of breath dyspnea    with exertion and when tired  . 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 Transfusion history    hx. 25 yrs ago-during cancer tx.    Tobacco Use: History  Smoking Status  . Never Smoker  Smokeless Tobacco  . Never Used    Labs: Recent Review Flowsheet Data    Labs for ITP Cardiac and Pulmonary Rehab Latest Ref Rng & Units 04/16/2016 04/16/2016 04/16/2016 04/16/2016 04/16/2016   Cholestrol 0 - 200 mg/dL - - - - -   LDLCALC 0 - 99 mg/dL - - - - -   LDLDIRECT mg/dL - - - - -   HDL >39.00 mg/dL - - - - -   Trlycerides 0.0 - 149.0 mg/dL - - - - -   Hemoglobin A1c 4.8 - 5.6 % - - - - -   PHART 7.350 - 7.450 - 7.367 - - 7.345(L)   PCO2ART 32.0 - 48.0 mmHg - 53.1(H) - - 54.9(H)   HCO3 20.0 - 28.0 mmol/L - 30.4(H) - - 30.0(H)   TCO2 0 - 100 mmol/L 30 32 30 29 32   O2SAT % - 100.0 - - 96.0      Capillary Blood Glucose: No results found for: GLUCAP   Exercise Target Goals:    Exercise Program Goal: Individual exercise prescription set with THRR, safety & activity barriers. Participant demonstrates ability to understand and report RPE using BORG scale, to self-measure pulse accurately, and to acknowledge the importance of the exercise prescription.  Exercise Prescription Goal: Starting with aerobic activity 30 plus minutes a day, 3 days per week for initial exercise prescription. Provide home exercise prescription and guidelines that participant acknowledges understanding prior to discharge.  Activity Barriers & Risk Stratification:     Activity Barriers & Cardiac Risk Stratification - 11/26/16 0855      Activity Barriers & Cardiac Risk Stratification   Activity Barriers None   Cardiac Risk Stratification High      6 Minute Walk:     6 Minute Walk    Row Name 11/26/16 1415         6 Minute Walk   Phase Initial     Distance 1843 feet     Walk Time 6 minutes     # of Rest Breaks 0     MPH 3.49     METS 4.41     RPE 11     VO2 Peak 15.43     Symptoms No     Resting HR 70 bpm     Resting BP 124/84     Max Ex. HR 104 bpm      Max Ex. BP 142/94     2 Minute Post BP 142/78        Oxygen Initial Assessment:   Oxygen Re-Evaluation:   Oxygen Discharge (Final Oxygen Re-Evaluation):   Initial Exercise Prescription:     Initial Exercise Prescription - 11/26/16 1400  Date of Initial Exercise RX and Referring Provider   Date 11/26/16   Referring Provider [name], MD.     Treadmill   MPH 2.4   Grade 2   Minutes 10   METs 3.5     Bike   Level 1.5   Minutes 10   METs 4.33     NuStep   Level 4   SPM 90   Minutes 10   METs 3     Prescription Details   Frequency (times per week) 3   Duration Progress to 30 minutes of continuous aerobic without signs/symptoms of physical distress     Intensity   THRR 40-80% of Max Heartrate 63-126   Ratings of Perceived Exertion 11-13   Perceived Dyspnea 0-4     Progression   Progression Continue to progress workloads to maintain intensity without signs/symptoms of physical distress.     Resistance Training   Training Prescription Yes   Weight 5lb   Reps 10-15      Perform Capillary Blood Glucose checks as needed.  Exercise Prescription Changes:     Exercise Prescription Changes    Row Name 12/09/16 1600             Response to Exercise   Blood Pressure (Admit) 112/64       Blood Pressure (Exercise) 134/88       Blood Pressure (Exit) 126/82       Heart Rate (Admit) 66 bpm       Heart Rate (Exercise) 110 bpm       Heart Rate (Exit) 75 bpm       Rating of Perceived Exertion (Exercise) 11       Duration Progress to 45 minutes of aerobic exercise without signs/symptoms of physical distress       Intensity THRR unchanged         Progression   Progression Continue to progress workloads to maintain intensity without signs/symptoms of physical distress.       Average METs 2.8         Resistance Training   Training Prescription Yes       Weight 5lb       Reps 10-15         Treadmill   MPH 3       Grade 0       Minutes 10        METs 3.3         Bike   Level 1       Minutes 10       METs 3.7         NuStep   Level 1       SPM 90       Minutes 10       METs 3.1          Exercise Comments:     Exercise Comments    Row Name 12/25/16 0749           Exercise Comments Reviewed METs and goals with pt.  Pt is doing well with exercise.           Exercise Goals and Review:     Exercise Goals    Row Name 11/26/16 0850             Exercise Goals   Increase Physical Activity Yes       Intervention Provide advice, education, support and counseling about physical activity/exercise needs.;Develop an individualized exercise prescription for  aerobic and resistive training based on initial evaluation findings, risk stratification, comorbidities and participant's personal goals.       Expected Outcomes Achievement of increased cardiorespiratory fitness and enhanced flexibility, muscular endurance and strength shown through measurements of functional capacity and personal statement of participant.       Increase Strength and Stamina Yes  Be able to climb stairs; improve overall cardiorespiratory fitness       Intervention Provide advice, education, support and counseling about physical activity/exercise needs.;Develop an individualized exercise prescription for aerobic and resistive training based on initial evaluation findings, risk stratification, comorbidities and participant's personal goals.       Expected Outcomes Achievement of increased cardiorespiratory fitness and enhanced flexibility, muscular endurance and strength shown through measurements of functional capacity and personal statement of participant.          Exercise Goals Re-Evaluation :     Exercise Goals Re-Evaluation    Row Name 12/25/16 0747             Exercise Goal Re-Evaluation   Exercise Goals Review Increase Physical Activity;Increase Strenth and Stamina       Comments Pt states he feels more comfortable with aerobic  exercise and he is tolerating workload increases well.  He states he does resistance training 3xs/week at home on his off days from CR and he walks 3min/day with his spouse.       Expected Outcomes Continue with exercises Rx, HEP and increase workloads as tolerated in order to increase cardiorespiratory fitness level           Discharge Exercise Prescription (Final Exercise Prescription Changes):     Exercise Prescription Changes - 12/09/16 1600      Response to Exercise   Blood Pressure (Admit) 112/64   Blood Pressure (Exercise) 134/88   Blood Pressure (Exit) 126/82   Heart Rate (Admit) 66 bpm   Heart Rate (Exercise) 110 bpm   Heart Rate (Exit) 75 bpm   Rating of Perceived Exertion (Exercise) 11   Duration Progress to 45 minutes of aerobic exercise without signs/symptoms of physical distress   Intensity THRR unchanged     Progression   Progression Continue to progress workloads to maintain intensity without signs/symptoms of physical distress.   Average METs 2.8     Resistance Training   Training Prescription Yes   Weight 5lb   Reps 10-15     Treadmill   MPH 3   Grade 0   Minutes 10   METs 3.3     Bike   Level 1   Minutes 10   METs 3.7     NuStep   Level 1   SPM 90   Minutes 10   METs 3.1      Nutrition:  Target Goals: Understanding of nutrition guidelines, daily intake of sodium 1500mg , cholesterol 200mg , calories 30% from fat and 7% or less from saturated fats, daily to have 5 or more servings of fruits and vegetables.  Biometrics:     Pre Biometrics - 11/26/16 1423      Pre Biometrics   Height 5' 9.5" (1.765 m)   Weight 187 lb 6.3 oz (85 kg)   Waist Circumference 36.75 inches   Hip Circumference 41 inches   Waist to Hip Ratio 0.9 %   BMI (Calculated) 27.3   Triceps Skinfold 15 mm   % Body Fat 25.5 %   Grip Strength 50 kg   Flexibility 7.5 in   Single Leg Stand 19.84 seconds  Nutrition Therapy Plan and Nutrition  Goals:   Nutrition Discharge: Nutrition Scores:   Nutrition Goals Re-Evaluation:   Nutrition Goals Re-Evaluation:   Nutrition Goals Discharge (Final Nutrition Goals Re-Evaluation):   Psychosocial: Target Goals: Acknowledge presence or absence of significant depression and/or stress, maximize coping skills, provide positive support system. Participant is able to verbalize types and ability to use techniques and skills needed for reducing stress and depression.  Initial Review & Psychosocial Screening:     Initial Psych Review & Screening - 11/26/16 1705      Initial Review   Current issues with None Identified     Family Dynamics   Good Support System? Yes     Barriers   Psychosocial barriers to participate in program There are no identifiable barriers or psychosocial needs.     Screening Interventions   Interventions Encouraged to exercise      Quality of Life Scores:     Quality of Life - 11/26/16 0955      Quality of Life Scores   Health/Function Pre 14.43 %   Socioeconomic Pre 24.43 %   Psych/Spiritual Pre 21.21 %   Family Pre 22.8 %   GLOBAL Pre 19.12 %      PHQ-9: Recent Review Flowsheet Data    Depression screen [facility] 2/9 02/03/2014   Decreased Interest 0   Down, Depressed, Hopeless 0   PHQ - 2 Score 0     Interpretation of Total Score  Total Score Depression Severity:  1-4 = Minimal depression, 5-9 = Mild depression, 10-14 = Moderate depression, 15-19 = Moderately severe depression, 20-27 = Severe depression   Psychosocial Evaluation and Intervention:     Psychosocial Evaluation - 12/02/16 1115      Psychosocial Evaluation & Interventions   Interventions Encouraged to exercise with the program and follow exercise prescription   Comments no psychosocial needs identified, no interventions necessary    Expected Outcomes pt will demonstrate positive outlook with good coping skills.    Continue Psychosocial Services  No Follow up required       Psychosocial Re-Evaluation:     Psychosocial Re-Evaluation    [place] Name 12/24/16 1054             Psychosocial Re-Evaluation   Current issues with None Identified       Comments pt displays eagerness to particiate in CR activities.         Expected Outcomes pt will exhibit positive outlook with good coping skills.        Interventions Encouraged to attend Cardiac Rehabilitation for the exercise          Psychosocial Discharge (Final Psychosocial Re-Evaluation):     Psychosocial Re-Evaluation - 12/24/16 1054      Psychosocial Re-Evaluation   Current issues with None Identified   Comments pt displays eagerness to particiate in CR activities.     Expected Outcomes pt will exhibit positive outlook with good coping skills.    Interventions Encouraged to attend Cardiac Rehabilitation for the exercise      Vocational Rehabilitation: Provide vocational rehab assistance to qualifying candidates.   Vocational Rehab Evaluation & Intervention:     Vocational Rehab - 11/26/16 1704      Initial Vocational Rehab Evaluation & Intervention   Assessment shows need for Vocational Rehabilitation No      Education: Education Goals: Education classes will be provided on a weekly basis, covering required topics. Participant will state understanding/return demonstration of topics presented.  Learning Barriers/Preferences:  Learning Barriers/Preferences - 11/26/16 0849      Learning Barriers/Preferences   Learning Barriers Sight   Learning Preferences Written Material      Education Topics: Count Your Pulse:  -Group instruction provided by verbal instruction, demonstration, patient participation and written materials to support subject.  Instructors address importance of being able to find your pulse and how to count your pulse when at home without a heart monitor.  Patients get hands on experience counting their pulse with staff help and individually.   Heart Attack,  Angina, and Risk Factor Modification:  -Group instruction provided by verbal instruction, video, and written materials to support subject.  Instructors address signs and symptoms of angina and heart attacks.    Also discuss risk factors for heart disease and how to make changes to improve heart health risk factors.   Functional Fitness:  -Group instruction provided by verbal instruction, demonstration, patient participation, and written materials to support subject.  Instructors address safety measures for doing things around the house.  Discuss how to get up and down off the floor, how to pick things up properly, how to safely get out of a chair without assistance, and balance training.   Meditation and Mindfulness:  -Group instruction provided by verbal instruction, patient participation, and written materials to support subject.  Instructor addresses importance of mindfulness and meditation practice to help reduce stress and improve awareness.  Instructor also leads participants through a meditation exercise.    Stretching for Flexibility and Mobility:  -Group instruction provided by verbal instruction, patient participation, and written materials to support subject.  Instructors lead participants through series of stretches that are designed to increase flexibility thus improving mobility.  These stretches are additional exercise for major muscle groups that are typically performed during regular warm up and cool down.   Hands Only CPR:  -Group verbal, video, and participation provides a basic overview of AHA guidelines for community CPR. Role-play of emergencies allow participants the opportunity to practice calling for help and chest compression technique with discussion of AED use.   Hypertension: -Group verbal and written instruction that provides a basic overview of hypertension including the most recent diagnostic guidelines, risk factor reduction with self-care instructions and  medication management.    Nutrition I class: Heart Healthy Eating:  -Group instruction provided by PowerPoint slides, verbal discussion, and written materials to support subject matter. The instructor gives an explanation and review of the Therapeutic Lifestyle Changes diet recommendations, which includes a discussion on lipid goals, dietary fat, sodium, fiber, plant stanol/sterol esters, sugar, and the components of a well-balanced, healthy diet.   Nutrition II class: Lifestyle Skills:  -Group instruction provided by PowerPoint slides, verbal discussion, and written materials to support subject matter. The instructor gives an explanation and review of label reading, grocery shopping for heart health, heart healthy recipe modifications, and ways to make healthier choices when eating out.   Diabetes Question & Answer:  -Group instruction provided by PowerPoint slides, verbal discussion, and written materials to support subject matter. The instructor gives an explanation and review of diabetes co-morbidities, pre- and post-prandial blood glucose goals, pre-exercise blood glucose goals, signs, symptoms, and treatment of hypoglycemia and hyperglycemia, and foot care basics.   Diabetes Blitz:  -Group instruction provided by PowerPoint slides, verbal discussion, and written materials to support subject matter. The instructor gives an explanation and review of the physiology behind type 1 and type 2 diabetes, diabetes medications and rational behind using different medications, pre- and post-prandial blood glucose  recommendations and Hemoglobin A1c goals, diabetes diet, and exercise including blood glucose guidelines for exercising safely.    Portion Distortion:  -Group instruction provided by PowerPoint slides, verbal discussion, written materials, and food models to support subject matter. The instructor gives an explanation of serving size versus portion size, changes in portions sizes over the last  20 years, and what consists of a serving from each food group.   Stress Management:  -Group instruction provided by verbal instruction, video, and written materials to support subject matter.  Instructors review role of stress in heart disease and how to cope with stress positively.     Exercising on Your Own:  -Group instruction provided by verbal instruction, power point, and written materials to support subject.  Instructors discuss benefits of exercise, components of exercise, frequency and intensity of exercise, and end points for exercise.  Also discuss use of nitroglycerin and activating EMS.  Review options of places to exercise outside of rehab.  Review guidelines for sex with heart disease.   Cardiac Drugs I:  -Group instruction provided by verbal instruction and written materials to support subject.  Instructor reviews cardiac drug classes: antiplatelets, anticoagulants, beta blockers, and statins.  Instructor discusses reasons, side effects, and lifestyle considerations for each drug class.   Cardiac Drugs II:  -Group instruction provided by verbal instruction and written materials to support subject.  Instructor reviews cardiac drug classes: angiotensin converting enzyme inhibitors (ACE-I), angiotensin II receptor blockers (ARBs), nitrates, and calcium channel blockers.  Instructor discusses reasons, side effects, and lifestyle considerations for each drug class.   Anatomy and Physiology of the Circulatory System:  Group verbal and written instruction and models provide basic cardiac anatomy and physiology, with the coronary electrical and arterial systems. Review of: AMI, Angina, Valve disease, Heart Failure, Peripheral Artery Disease, Cardiac Arrhythmia, Pacemakers, and the ICD.   Other Education:  -Group or individual verbal, written, or video instructions that support the educational goals of the cardiac rehab program.   Knowledge Questionnaire Score:     Knowledge  Questionnaire Score - 11/26/16 0955      Knowledge Questionnaire Score   Pre Score 20/24      Core Components/Risk Factors/Patient Goals at Admission:     Personal Goals and Risk Factors at Admission - 11/26/16 1407      Core Components/Risk Factors/Patient Goals on Admission   Hypertension Yes   Intervention Provide education on lifestyle modifcations including regular physical activity/exercise, weight management, moderate sodium restriction and increased consumption of fresh fruit, vegetables, and low fat dairy, alcohol moderation, and smoking cessation.;Monitor prescription use compliance.   Expected Outcomes Short Term: Continued assessment and intervention until BP is < 140/73mm HG in hypertensive participants. < 130/48mm HG in hypertensive participants with diabetes, heart failure or chronic kidney disease.;Long Term: Maintenance of blood pressure at goal levels.   Lipids Yes   Intervention Provide education and support for participant on nutrition & aerobic/resistive exercise along with prescribed medications to achieve LDL 70mg , HDL >40mg .   Expected Outcomes Short Term: Participant states understanding of desired cholesterol values and is compliant with medications prescribed. Participant is following exercise prescription and nutrition guidelines.;Long Term: Cholesterol controlled with medications as prescribed, with individualized exercise RX and with personalized nutrition plan. Value goals: LDL < 70mg , HDL > 40 mg.   Personal Goal Other Yes   Personal Goal Increase stamina: be able to climb stairs. Improve cardiorespiratory fitness.   Intervention Develop an individual exercise prescription for aerobic and resistive training based on  evaluation findings, risk stratification, comorbidiites and participant's personal goals.   Expected Outcomes Achieve increased cardiorespiratory fitness and enhanced flexibilty, endurance and strength shown through measures of functional capacity  and personal statements of participant.      Core Components/Risk Factors/Patient Goals Review:      Goals and Risk Factor Review    Row Name 12/24/16 1053             Core Components/Risk Factors/Patient Goals Review   Personal Goals Review Hypertension;Lipids       Review pt demonstrates eagerness to participate in CR activities.        Expected Outcomes pt will participate in CR exercise, nutrition and lifestyle education opportunities to decrease overall CAD RF.            Core Components/Risk Factors/Patient Goals at Discharge (Final Review):      Goals and Risk Factor Review - 12/24/16 1053      Core Components/Risk Factors/Patient Goals Review   Personal Goals Review Hypertension;Lipids   Review pt demonstrates eagerness to participate in CR activities.    Expected Outcomes pt will participate in CR exercise, nutrition and lifestyle education opportunities to decrease overall CAD RF.        ITP Comments:     ITP Comments    Row Name 11/26/16 0846           ITP Comments Medical Director, Dr. Fransico Him          Comments: Pt is making expected progress toward personal goals after completing 7 sessions. Recommend continued exercise and life style modification education including  stress management and relaxation techniques to decrease cardiac risk profile.

## 2016-12-26 ENCOUNTER — Ambulatory Visit (HOSPITAL_BASED_OUTPATIENT_CLINIC_OR_DEPARTMENT_OTHER): Payer: Medicare Other | Admitting: Oncology

## 2016-12-26 ENCOUNTER — Ambulatory Visit (HOSPITAL_BASED_OUTPATIENT_CLINIC_OR_DEPARTMENT_OTHER): Payer: Medicare Other

## 2016-12-26 ENCOUNTER — Encounter: Payer: Self-pay | Admitting: *Deleted

## 2016-12-26 VITALS — BP 140/78 | HR 76 | Temp 97.8°F | Resp 20 | Ht 69.5 in | Wt 183.8 lb

## 2016-12-26 DIAGNOSIS — C7A8 Other malignant neuroendocrine tumors: Secondary | ICD-10-CM

## 2016-12-26 DIAGNOSIS — D3A8 Other benign neuroendocrine tumors: Secondary | ICD-10-CM

## 2016-12-26 DIAGNOSIS — C7B8 Other secondary neuroendocrine tumors: Secondary | ICD-10-CM

## 2016-12-26 MED ORDER — OCTREOTIDE ACETATE 30 MG IM KIT
30.0000 mg | PACK | Freq: Once | INTRAMUSCULAR | Status: AC
  Administered 2016-12-26: 30 mg via INTRAMUSCULAR
  Filled 2016-12-26: qty 1

## 2016-12-26 NOTE — Progress Notes (Signed)
Disability papers placed in box for Ronald Thompson for patient.

## 2016-12-26 NOTE — Progress Notes (Signed)
  [place] OFFICE PROGRESS NOTE   Diagnosis: Pancreas neuroendocrine tumor  INTERVAL HISTORY:   Ronald Thompson returns as scheduled. He feels well. He is participating in a cardiac rehabilitation program. He attends cardiac rehabilitation 3 days per week. He recently returned from a trip to the beach. No fever or diarrhea. No new complaint.  Objective:  Vital signs in last 24 hours:  Blood pressure 140/78, pulse 76, temperature 97.8 F (36.6 C), temperature source Oral, resp. rate 20, height 5' 9.5" (1.765 m), weight 183 lb 12.8 oz (83.4 kg), SpO2 97 %.    Resp: Decreased breath sounds at the left upper anterior chest Cardio: Regular rate and rhythm GI: No hepatosplenomegaly, nontender Vascular: No leg edema  Portacath/PICC-without erythema  Medications: I have reviewed the patient's current medications.  Assessment/Plan: 1. Pancreatic neuroendocrine tumor, WHO grade 2, pancreatic head mass and small peripancreatic/celiac nodes on a CT 02/04/2014  EUS revealed evidence of multiple liver metastases-status post an FNA biopsy of a left liver lesion 02/10/2014 confirming a neuroendocrine tumor   Octreotide scan 04/01/2014 with multiple foci of metastatic neuroendocrine tumor in the liver  Monthly Sandostatin started 03/16/2014  Restaging CT 07/04/2014 with resolution of a previously noted pancreas head lesion and probable progression of liver metastases  Restaging CT 10/31/2014-stable liver lesions, no evidence of a pancreas mass, stable.  Restaging CT 02/28/2015-stable hepatic metastases, stable pancreas lesion unchanged  Restaging CT 08/30/2015-stable pancreas mass, slight enlargement of hepatic metastases  Monthly Sandostatin continued  Restaging CTs06/14/2017 revealed enlargement of liver lesions, no new lesions, enlargement of the pancreas head mass  Gallium DOTATATEscan 10/01/2016 confirmed uptake in the liver metastases, pancreas primary,  peripancreatic adenopathy, and left iliac bone  2. Chest Seminoma in 1990 treated with BEP and chest radiation at [facility] 3. 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 chronic exertional dyspnea following treatment for the seminoma  5. aortic stenosis on an echocardiogram December 2011, severe aortic stenosis on echocardiogram 12/06/2015, status postTAVR on 04/16/2016 6. lumbar disc surgery 2014  7. acute abdominal pain 02/04/2014-resolved    Disposition:  Mr. Cape appears stable. He continues monthly Sandostatin. He is undergoing the insurance approval process to began treatment with Lutathera. He will be scheduled for an office visit in 2 months. The plan is to proceed with Lutathera if he receives insurance approval.  Betsy Coder, MD  12/26/2016  10:19 AM

## 2016-12-26 NOTE — Patient Instructions (Signed)
Influenza Virus Vaccine (Flucelvax) What is this medicine? INFLUENZA VIRUS VACCINE (in floo EN zuh VAHY ruhs vak SEEN) helps to reduce the risk of getting influenza also known as the flu. The vaccine only helps protect you against some strains of the flu. This medicine may be used for other purposes; ask your health care provider or pharmacist if you have questions. What should I tell my health care provider before I take this medicine? They need to know if you have any of these conditions: -bleeding disorder like hemophilia -fever or infection -Guillain-Barre syndrome or other neurological problems -immune system problems -infection with the human immunodeficiency virus (HIV) or AIDS -low blood platelet counts -multiple sclerosis -an unusual or allergic reaction to influenza virus vaccine, other medicines, foods, dyes or preservatives -pregnant or trying to get pregnant -breast-feeding How should I use this medicine? This vaccine is for injection into a muscle. It is given by a health care professional. A copy of Vaccine Information Statements will be given before each vaccination. Read this sheet carefully each time. The sheet may change frequently. Talk to your pediatrician regarding the use of this medicine in children. Special care may be needed. Overdosage: If you think you've taken too much of this medicine contact a poison control center or emergency room at once. Overdosage: If you think you have taken too much of this medicine contact a poison control center or emergency room at once. NOTE: This medicine is only for you. Do not share this medicine with others. What if I miss a dose? This does not apply. What may interact with this medicine? -chemotherapy or radiation therapy -medicines that lower your immune system like etanercept, anakinra, infliximab, and adalimumab -medicines that treat or prevent blood clots like warfarin -phenytoin -steroid medicines like prednisone or  cortisone -theophylline -vaccines This list may not describe all possible interactions. Give your health care provider a list of all the medicines, herbs, non-prescription drugs, or dietary supplements you use. Also tell them if you smoke, drink alcohol, or use illegal drugs. Some items may interact with your medicine. What should I watch for while using this medicine? Report any side effects that do not go away within 3 days to your doctor or health care professional. Call your health care provider if any unusual symptoms occur within 6 weeks of receiving this vaccine. You may still catch the flu, but the illness is not usually as bad. You cannot get the flu from the vaccine. The vaccine will not protect against colds or other illnesses that may cause fever. The vaccine is needed every year. What side effects may I notice from receiving this medicine? Side effects that you should report to your doctor or health care professional as soon as possible: -allergic reactions like skin rash, itching or hives, swelling of the face, lips, or tongue Side effects that usually do not require medical attention (Report these to your doctor or health care professional if they continue or are bothersome.): -fever -headache -muscle aches and pains -pain, tenderness, redness, or swelling at the injection site -tiredness This list may not describe all possible side effects. Call your doctor for medical advice about side effects. You may report side effects to FDA at 1-800-FDA-1088. Where should I keep my medicine? The vaccine will be given by a health care professional in a clinic, pharmacy, doctor's office, or other health care setting. You will not be given vaccine doses to store at home. NOTE: This sheet is a summary. It may not cover   all possible information. If you have questions about this medicine, talk to your doctor, pharmacist, or health care provider.    2016, Elsevier/Gold Standard. (2011-07-03  14:06:47) Octreotide injection solution What is this medicine? OCTREOTIDE (ok TREE oh tide) is used to reduce blood levels of growth hormone in patients with a condition called acromegaly. This medicine also reduces flushing and watery diarrhea caused by certain types of cancer. This medicine may be used for other purposes; ask your health care provider or pharmacist if you have questions. What should I tell my health care provider before I take this medicine? They need to know if you have any of these conditions: -gallbladder disease -kidney disease -liver disease -an unusual or allergic reaction to octreotide, other medicines, foods, dyes, or preservatives -pregnant or trying to get pregnant -breast-feeding How should I use this medicine? This medicine is for injection under the skin or into a vein (only in emergency situations). It is usually given by a health care professional in a hospital or clinic setting. If you get this medicine at home, you will be taught how to prepare and give this medicine. Allow the injection solution to come to room temperature before use. Do not warm it artificially. Use exactly as directed. Take your medicine at regular intervals. Do not take your medicine more often than directed. It is important that you put your used needles and syringes in a special sharps container. Do not put them in a trash can. If you do not have a sharps container, call your pharmacist or healthcare provider to get one. Talk to your pediatrician regarding the use of this medicine in children. Special care may be needed. Overdosage: If you think you have taken too much of this medicine contact a poison control center or emergency room at once. NOTE: This medicine is only for you. Do not share this medicine with others. What if I miss a dose? If you miss a dose, take it as soon as you can. If it is almost time for your next dose, take only that dose. Do not take double or extra  doses. What may interact with this medicine? Do not take this medicine with any of the following medications: -cisapride -droperidol -general anesthetics -grepafloxacin -perphenazine -thioridazine This medicine may also interact with the following medications: -bromocriptine -cyclosporine -diuretics -medicines for blood pressure, heart disease, irregular heart beat -medicines for diabetes, including insulin -quinidine This list may not describe all possible interactions. Give your health care provider a list of all the medicines, herbs, non-prescription drugs, or dietary supplements you use. Also tell them if you smoke, drink alcohol, or use illegal drugs. Some items may interact with your medicine. What should I watch for while using this medicine? Visit your doctor or health care professional for regular checks on your progress. To help reduce irritation at the injection site, use a different site for each injection and make sure the solution is at room temperature before use. This medicine may cause increases or decreases in blood sugar. Signs of high blood sugar include frequent urination, unusual thirst, flushed or dry skin, difficulty breathing, drowsiness, stomach ache, nausea, vomiting or dry mouth. Signs of low blood sugar include chills, cool, pale skin or cold sweats, drowsiness, extreme hunger, fast heartbeat, headache, nausea, nervousness or anxiety, shakiness, trembling, unsteadiness, tiredness, or weakness. Contact your doctor or health care professional right away if you experience any of these symptoms. What side effects may I notice from receiving this medicine? Side effects that you  should report to your doctor or health care professional as soon as possible: -allergic reactions like skin rash, itching or hives, swelling of the face, lips, or tongue -changes in blood sugar -changes in heart rate -severe stomach pain Side effects that usually do not require medical  attention (report to your doctor or health care professional if they continue or are bothersome): -diarrhea or constipation -gas or stomach pain -nausea, vomiting -pain, redness, swelling and irritation at site where injected This list may not describe all possible side effects. Call your doctor for medical advice about side effects. You may report side effects to FDA at 1-800-FDA-1088. Where should I keep my medicine? Keep out of the reach of children. Store in a refrigerator between 2 and 8 degrees C (36 and 46 degrees F). Protect from light. Allow to come to room temperature naturally. Do not use artificial heat. If protected from light, the injection may be stored at room temperature between 20 and 30 degrees C (70 and 86 degrees F) for 14 days. After the initial use, throw away any unused portion of a multiple dose vial after 14 days. Throw away unused portions of the ampules after use. NOTE: This sheet is a summary. It may not cover all possible information. If you have questions about this medicine, talk to your doctor, pharmacist, or health care provider.    2016, Elsevier/Gold Standard. (2008-02-16 16:56:04) Octreotide injection solution What is this medicine? OCTREOTIDE (ok TREE oh tide) is used to reduce blood levels of growth hormone in patients with a condition called acromegaly. This medicine also reduces flushing and watery diarrhea caused by certain types of cancer. This medicine may be used for other purposes; ask your health care provider or pharmacist if you have questions. What should I tell my health care provider before I take this medicine? They need to know if you have any of these conditions: -gallbladder disease -kidney disease -liver disease -an unusual or allergic reaction to octreotide, other medicines, foods, dyes, or preservatives -pregnant or trying to get pregnant -breast-feeding How should I use this medicine? This medicine is for injection under the skin  or into a vein (only in emergency situations). It is usually given by a health care professional in a hospital or clinic setting. If you get this medicine at home, you will be taught how to prepare and give this medicine. Allow the injection solution to come to room temperature before use. Do not warm it artificially. Use exactly as directed. Take your medicine at regular intervals. Do not take your medicine more often than directed. It is important that you put your used needles and syringes in a special sharps container. Do not put them in a trash can. If you do not have a sharps container, call your pharmacist or healthcare provider to get one. Talk to your pediatrician regarding the use of this medicine in children. Special care may be needed. Overdosage: If you think you have taken too much of this medicine contact a poison control center or emergency room at once. NOTE: This medicine is only for you. Do not share this medicine with others. What if I miss a dose? If you miss a dose, take it as soon as you can. If it is almost time for your next dose, take only that dose. Do not take double or extra doses. What may interact with this medicine? Do not take this medicine with any of the following medications: -cisapride -droperidol -general anesthetics -grepafloxacin -perphenazine -thioridazine This medicine  may also interact with the following medications: -bromocriptine -cyclosporine -diuretics -medicines for blood pressure, heart disease, irregular heart beat -medicines for diabetes, including insulin -quinidine This list may not describe all possible interactions. Give your health care provider a list of all the medicines, herbs, non-prescription drugs, or dietary supplements you use. Also tell them if you smoke, drink alcohol, or use illegal drugs. Some items may interact with your medicine. What should I watch for while using this medicine? Visit your doctor or health care  professional for regular checks on your progress. To help reduce irritation at the injection site, use a different site for each injection and make sure the solution is at room temperature before use. This medicine may cause increases or decreases in blood sugar. Signs of high blood sugar include frequent urination, unusual thirst, flushed or dry skin, difficulty breathing, drowsiness, stomach ache, nausea, vomiting or dry mouth. Signs of low blood sugar include chills, cool, pale skin or cold sweats, drowsiness, extreme hunger, fast heartbeat, headache, nausea, nervousness or anxiety, shakiness, trembling, unsteadiness, tiredness, or weakness. Contact your doctor or health care professional right away if you experience any of these symptoms. What side effects may I notice from receiving this medicine? Side effects that you should report to your doctor or health care professional as soon as possible: -allergic reactions like skin rash, itching or hives, swelling of the face, lips, or tongue -changes in blood sugar -changes in heart rate -severe stomach pain Side effects that usually do not require medical attention (report to your doctor or health care professional if they continue or are bothersome): -diarrhea or constipation -gas or stomach pain -nausea, vomiting -pain, redness, swelling and irritation at site where injected This list may not describe all possible side effects. Call your doctor for medical advice about side effects. You may report side effects to FDA at 1-800-FDA-1088. Where should I keep my medicine? Keep out of the reach of children. Store in a refrigerator between 2 and 8 degrees C (36 and 46 degrees F). Protect from light. Allow to come to room temperature naturally. Do not use artificial heat. If protected from light, the injection may be stored at room temperature between 20 and 30 degrees C (70 and 86 degrees F) for 14 days. After the initial use, throw away any unused  portion of a multiple dose vial after 14 days. Throw away unused portions of the ampules after use. NOTE: This sheet is a summary. It may not cover all possible information. If you have questions about this medicine, talk to your doctor, pharmacist, or health care provider.    2016, Elsevier/Gold Standard. (2008-02-16 16:56:04) Octreotide injection solution What is this medicine? OCTREOTIDE (ok TREE oh tide) is used to reduce blood levels of growth hormone in patients with a condition called acromegaly. This medicine also reduces flushing and watery diarrhea caused by certain types of cancer. This medicine may be used for other purposes; ask your health care provider or pharmacist if you have questions. COMMON BRAND NAME(S): Sandostatin, Sandostatin LAR What should I tell my health care provider before I take this medicine? They need to know if you have any of these conditions: -gallbladder disease -kidney disease -liver disease -an unusual or allergic reaction to octreotide, other medicines, foods, dyes, or preservatives -pregnant or trying to get pregnant -breast-feeding How should I use this medicine? This medicine is for injection under the skin or into a vein (only in emergency situations). It is usually given by a health care  professional in a hospital or clinic setting. If you get this medicine at home, you will be taught how to prepare and give this medicine. Allow the injection solution to come to room temperature before use. Do not warm it artificially. Use exactly as directed. Take your medicine at regular intervals. Do not take your medicine more often than directed. It is important that you put your used needles and syringes in a special sharps container. Do not put them in a trash can. If you do not have a sharps container, call your pharmacist or healthcare provider to get one. Talk to your pediatrician regarding the use of this medicine in children. Special care may be  needed. Overdosage: If you think you have taken too much of this medicine contact a poison control center or emergency room at once. NOTE: This medicine is only for you. Do not share this medicine with others. What if I miss a dose? If you miss a dose, take it as soon as you can. If it is almost time for your next dose, take only that dose. Do not take double or extra doses. What may interact with this medicine? Do not take this medicine with any of the following medications: -cisapride -droperidol -general anesthetics -grepafloxacin -perphenazine -thioridazine This medicine may also interact with the following medications: -bromocriptine -cyclosporine -diuretics -medicines for blood pressure, heart disease, irregular heart beat -medicines for diabetes, including insulin -quinidine This list may not describe all possible interactions. Give your health care provider a list of all the medicines, herbs, non-prescription drugs, or dietary supplements you use. Also tell them if you smoke, drink alcohol, or use illegal drugs. Some items may interact with your medicine. What should I watch for while using this medicine? Visit your doctor or health care professional for regular checks on your progress. To help reduce irritation at the injection site, use a different site for each injection and make sure the solution is at room temperature before use. This medicine may cause increases or decreases in blood sugar. Signs of high blood sugar include frequent urination, unusual thirst, flushed or dry skin, difficulty breathing, drowsiness, stomach ache, nausea, vomiting or dry mouth. Signs of low blood sugar include chills, cool, pale skin or cold sweats, drowsiness, extreme hunger, fast heartbeat, headache, nausea, nervousness or anxiety, shakiness, trembling, unsteadiness, tiredness, or weakness. Contact your doctor or health care professional right away if you experience any of these symptoms. What  side effects may I notice from receiving this medicine? Side effects that you should report to your doctor or health care professional as soon as possible: -allergic reactions like skin rash, itching or hives, swelling of the face, lips, or tongue -changes in blood sugar -changes in heart rate -severe stomach pain Side effects that usually do not require medical attention (report to your doctor or health care professional if they continue or are bothersome): -diarrhea or constipation -gas or stomach pain -nausea, vomiting -pain, redness, swelling and irritation at site where injected This list may not describe all possible side effects. Call your doctor for medical advice about side effects. You may report side effects to FDA at 1-800-FDA-1088. Where should I keep my medicine? Keep out of the reach of children. Store in a refrigerator between 2 and 8 degrees C (36 and 46 degrees F). Protect from light. Allow to come to room temperature naturally. Do not use artificial heat. If protected from light, the injection may be stored at room temperature between 20 and 30 degrees C (  70 and 86 degrees F) for 14 days. After the initial use, throw away any unused portion of a multiple dose vial after 14 days. Throw away unused portions of the ampules after use. NOTE: This sheet is a summary. It may not cover all possible information. If you have questions about this medicine, talk to your doctor, pharmacist, or health care provider.  2018 Elsevier/Gold Standard (2008-02-16 16:56:04)

## 2016-12-27 ENCOUNTER — Telehealth: Payer: Self-pay | Admitting: Oncology

## 2016-12-27 ENCOUNTER — Encounter (HOSPITAL_COMMUNITY)
Admission: RE | Admit: 2016-12-27 | Discharge: 2016-12-27 | Disposition: A | Discharge status: Home / Self Care | Payer: Medicare Other | Source: Ambulatory Visit | Attending: Cardiovascular Disease | Admitting: Cardiovascular Disease

## 2016-12-27 DIAGNOSIS — Z952 Presence of prosthetic heart valve: Secondary | ICD-10-CM | POA: Diagnosis not present

## 2016-12-27 NOTE — Telephone Encounter (Signed)
Injections and follow up with Dr [name] scheduled for 06/21 and 07/24, per 12/26/16 los. Appointments confirmed with patient.

## 2016-12-30 ENCOUNTER — Encounter (HOSPITAL_COMMUNITY): Payer: Medicare Other

## 2017-01-01 ENCOUNTER — Encounter (HOSPITAL_COMMUNITY)
Admission: RE | Admit: 2017-01-01 | Discharge: 2017-01-01 | Disposition: A | Discharge status: Home / Self Care | Payer: Medicare Other | Source: Ambulatory Visit | Attending: Cardiovascular Disease | Admitting: Cardiovascular Disease

## 2017-01-01 DIAGNOSIS — Z952 Presence of prosthetic heart valve: Secondary | ICD-10-CM

## 2017-01-01 NOTE — Progress Notes (Signed)
Ronald Thompson 61 y.o. male Nutrition Note Spoke with pt. Nutrition Plan and Nutrition Survey goals reviewed with pt. Pt is following Step 1 of the Therapeutic Lifestyle Changes diet. Pt is pre-diabetic according to his last A1c. Pt was unaware of pre-diabetes. Pre-diabetes discussed. Pt encouraged  Pt expressed understanding of the information reviewed. Pt aware of nutrition education classes offered and is unable to attend nutrition classes at this time.  Lab Results  Component Value Date   HGBA1C 5.9 (H) 04/12/2016   Wt Readings from Last 3 Encounters:  12/26/16 183 lb 12.8 oz (83.4 kg)  11/26/16 187 lb 6.3 oz (85 kg)  10/30/16 186 lb 1.6 oz (84.4 kg)   Nutrition Diagnosis ? Food-and nutrition-related knowledge deficit related to lack of exposure to information as related to diagnosis of: ? CVD ? Pre-DM ?  Nutrition Intervention ? Pt's individual nutrition plan reviewed with pt. ? Benefits of adopting Therapeutic Lifestyle Changes discussed when Medficts reviewed. ? Pt to attend the Portion Distortion class ? Pt given handouts for: ? Nutrition I class ? Nutrition II class  ? Continue client-centered nutrition education by RD, as part of interdisciplinary care. Goal(s) ? Pt to identify and limit food sources of saturated fat, trans fat, and sodium ? Pt to identify food quantities necessary to achieve weight mainenance at graduation from cardiac rehab.  Monitor and Evaluate progress toward nutrition goal with team. Derek Mound, M.Ed, RD, LDN, CDE 01/01/2017 8:13 AM

## 2017-01-02 ENCOUNTER — Telehealth: Payer: Self-pay | Admitting: Diagnostic Radiology

## 2017-01-02 NOTE — Telephone Encounter (Signed)
Lutathera consult : 01/08/17 at 10:30 am

## 2017-01-03 ENCOUNTER — Other Ambulatory Visit (HOSPITAL_COMMUNITY): Payer: Self-pay | Admitting: Diagnostic Radiology

## 2017-01-03 ENCOUNTER — Encounter (HOSPITAL_COMMUNITY)
Admission: RE | Admit: 2017-01-03 | Discharge: 2017-01-03 | Disposition: A | Discharge status: Home / Self Care | Payer: Medicare Other | Source: Ambulatory Visit | Attending: Cardiovascular Disease | Admitting: Cardiovascular Disease

## 2017-01-03 DIAGNOSIS — D3A8 Other benign neuroendocrine tumors: Secondary | ICD-10-CM

## 2017-01-03 DIAGNOSIS — Z952 Presence of prosthetic heart valve: Secondary | ICD-10-CM | POA: Insufficient documentation

## 2017-01-06 ENCOUNTER — Encounter (HOSPITAL_COMMUNITY)
Admission: RE | Admit: 2017-01-06 | Discharge: 2017-01-06 | Disposition: A | Discharge status: Home / Self Care | Payer: Medicare Other | Source: Ambulatory Visit | Attending: Cardiovascular Disease | Admitting: Cardiovascular Disease

## 2017-01-06 DIAGNOSIS — Z952 Presence of prosthetic heart valve: Secondary | ICD-10-CM | POA: Diagnosis not present

## 2017-01-08 ENCOUNTER — Encounter: Payer: Self-pay | Admitting: Internal Medicine

## 2017-01-08 ENCOUNTER — Ambulatory Visit (HOSPITAL_COMMUNITY)
Admission: RE | Admit: 2017-01-08 | Discharge: 2017-01-08 | Disposition: A | Discharge status: Home / Self Care | Payer: Medicare Other | Source: Ambulatory Visit | Attending: Diagnostic Radiology | Admitting: Diagnostic Radiology

## 2017-01-08 ENCOUNTER — Other Ambulatory Visit (INDEPENDENT_AMBULATORY_CARE_PROVIDER_SITE_OTHER): Payer: Medicare Other

## 2017-01-08 ENCOUNTER — Ambulatory Visit (INDEPENDENT_AMBULATORY_CARE_PROVIDER_SITE_OTHER): Payer: Medicare Other | Admitting: Internal Medicine

## 2017-01-08 ENCOUNTER — Encounter (HOSPITAL_COMMUNITY)
Admission: RE | Admit: 2017-01-08 | Discharge: 2017-01-08 | Disposition: A | Discharge status: Home / Self Care | Payer: Medicare Other | Source: Ambulatory Visit | Attending: Cardiovascular Disease | Admitting: Cardiovascular Disease

## 2017-01-08 VITALS — BP 124/86 | HR 72 | Ht 70.0 in | Wt 182.0 lb

## 2017-01-08 DIAGNOSIS — D3A8 Other benign neuroendocrine tumors: Secondary | ICD-10-CM

## 2017-01-08 DIAGNOSIS — Z952 Presence of prosthetic heart valve: Secondary | ICD-10-CM

## 2017-01-08 DIAGNOSIS — R030 Elevated blood-pressure reading, without diagnosis of hypertension: Secondary | ICD-10-CM

## 2017-01-08 DIAGNOSIS — C259 Malignant neoplasm of pancreas, unspecified: Secondary | ICD-10-CM | POA: Diagnosis not present

## 2017-01-08 DIAGNOSIS — N320 Bladder-neck obstruction: Secondary | ICD-10-CM

## 2017-01-08 DIAGNOSIS — C787 Secondary malignant neoplasm of liver and intrahepatic bile duct: Secondary | ICD-10-CM | POA: Diagnosis not present

## 2017-01-08 DIAGNOSIS — E785 Hyperlipidemia, unspecified: Secondary | ICD-10-CM | POA: Diagnosis not present

## 2017-01-08 DIAGNOSIS — Z114 Encounter for screening for human immunodeficiency virus [HIV]: Secondary | ICD-10-CM | POA: Diagnosis not present

## 2017-01-08 DIAGNOSIS — R5383 Other fatigue: Secondary | ICD-10-CM

## 2017-01-08 DIAGNOSIS — R739 Hyperglycemia, unspecified: Secondary | ICD-10-CM

## 2017-01-08 LAB — HEPATIC FUNCTION PANEL
ALBUMIN: 4.6 g/dL (ref 3.5–5.2)
ALK PHOS: 60 U/L (ref 39–117)
ALT: 21 U/L (ref 0–53)
AST: 27 U/L (ref 0–37)
Bilirubin, Direct: 0.3 mg/dL (ref 0.0–0.3)
TOTAL PROTEIN: 6.8 g/dL (ref 6.0–8.3)
Total Bilirubin: 1.3 mg/dL — ABNORMAL HIGH (ref 0.2–1.2)

## 2017-01-08 LAB — URINALYSIS, ROUTINE W REFLEX MICROSCOPIC
Bilirubin Urine: NEGATIVE
Hgb urine dipstick: NEGATIVE
LEUKOCYTES UA: NEGATIVE
Nitrite: NEGATIVE
PH: 7 (ref 5.0–8.0)
SPECIFIC GRAVITY, URINE: 1.015 (ref 1.000–1.030)
TOTAL PROTEIN, URINE-UPE24: NEGATIVE
Urine Glucose: NEGATIVE
Urobilinogen, UA: 0.2 (ref 0.0–1.0)

## 2017-01-08 LAB — CBC WITH DIFFERENTIAL/PLATELET
Basophils Absolute: 0 10*3/uL (ref 0.0–0.1)
Basophils Relative: 0.4 % (ref 0.0–3.0)
EOS ABS: 0.1 10*3/uL (ref 0.0–0.7)
EOS PCT: 0.9 % (ref 0.0–5.0)
HCT: 47 % (ref 39.0–52.0)
Hemoglobin: 16 g/dL (ref 13.0–17.0)
Lymphocytes Relative: 10.7 % — ABNORMAL LOW (ref 12.0–46.0)
Lymphs Abs: 0.7 10*3/uL (ref 0.7–4.0)
MCHC: 34 g/dL (ref 30.0–36.0)
MCV: 95.1 fl (ref 78.0–100.0)
MONO ABS: 0.7 10*3/uL (ref 0.1–1.0)
Monocytes Relative: 10.1 % (ref 3.0–12.0)
Neutro Abs: 5.2 10*3/uL (ref 1.4–7.7)
Neutrophils Relative %: 77.9 % — ABNORMAL HIGH (ref 43.0–77.0)
Platelets: 175 10*3/uL (ref 150.0–400.0)
RBC: 4.94 Mil/uL (ref 4.22–5.81)
RDW: 12.7 % (ref 11.5–15.5)
WBC: 6.6 10*3/uL (ref 4.0–10.5)

## 2017-01-08 LAB — BASIC METABOLIC PANEL
BUN: 24 mg/dL — AB (ref 6–23)
CHLORIDE: 101 meq/L (ref 96–112)
CO2: 32 meq/L (ref 19–32)
Calcium: 9.7 mg/dL (ref 8.4–10.5)
Creatinine, Ser: 0.98 mg/dL (ref 0.40–1.50)
GFR: 82.05 mL/min (ref >60.00)
GLUCOSE: 105 mg/dL — AB (ref 70–99)
POTASSIUM: 4.8 meq/L (ref 3.5–5.1)
SODIUM: 139 meq/L (ref 135–145)

## 2017-01-08 LAB — LIPID PANEL
CHOLESTEROL: 131 mg/dL (ref 0–200)
HDL: 48.8 mg/dL (ref >39.00)
LDL Cholesterol: 71 mg/dL (ref 0–99)
NonHDL: 81.83
TRIGLYCERIDES: 56 mg/dL (ref 0.0–149.0)
Total CHOL/HDL Ratio: 3
VLDL: 11.2 mg/dL (ref 0.0–40.0)

## 2017-01-08 LAB — HEMOGLOBIN A1C: HEMOGLOBIN A1C: 6.2 % (ref 4.6–6.5)

## 2017-01-08 LAB — PSA: PSA: 1.98 ng/mL (ref 0.10–4.00)

## 2017-01-08 LAB — TSH: TSH: 0.97 u[IU]/mL (ref 0.35–4.50)

## 2017-01-08 LAB — HIV ANTIBODY (ROUTINE TESTING W REFLEX): HIV 1&2 Ab, 4th Generation: NONREACTIVE

## 2017-01-08 NOTE — Patient Instructions (Signed)
Please continue all other medications as before, and refills have been done if requested.  Please have the pharmacy call with any other refills you may need.  Please continue your efforts at being more active, low cholesterol diet, and weight control.  You are otherwise up to date with prevention measures today.  Please keep your appointments with your specialists as you may have planned  Please go to the LAB in the Basement (turn left off the elevator) for the tests to be done today  You will be contacted by phone if any changes need to be made immediately.  Otherwise, you will receive a letter about your results with an explanation, but please check with MyChart first.  Please remember to sign up for MyChart if you have not done so, as this will be important to you in the future with finding out test results, communicating by private email, and scheduling acute appointments online when needed.  If you have Medicare related insurance (such as traditional Medicare, Blue [employer] or [employer], or similar), Please make an appointment at the [facility] with Sharee Pimple, the [employer], for your Wellness Visit in this office, which is a benefit with your insurance.  Please return in 1 year for your yearly visit, or sooner if needed

## 2017-01-08 NOTE — Consult Note (Signed)
Chief Complaint: Patient was seen in consultation today for metastatic pancreatic neuroendocrine tumor PRRT therapy at the request of [name].   Referring Physician(s): [name]   Patient Status: Ronald Thompson - Out-pt  History of Present Illness: Ronald Thompson is a 61 y.o. male with metastatic pancreatic neuroendocrine tumor. Liver metastasis and pelvic bone metastasis.  Patient currently on monthly Sandostatin therapy with last dose 12/26/16. Original diagnosis in 2015. 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 Valvular heart disease. Patient currently asymptomatic from metastatic neuroendocrine tumor.    Past Medical History:  Diagnosis Date  . Baker's cyst   . Gallstones   . Heart murmur   . Hypercholesteremia   . Hypertension   . Lesion of left lung    hx.25 yrs ago- testicilar cancer related- only scarring left after tx.-no problems now  . Liver metastasis ([name])   . 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  . Pericarditis    2nd to tumor  . Pneumonia 1990  . Primary neuroendocrine tumor of pancreas 02/14/2014   Stage IV with multiple mets to liver  . Pulmonary fibrosis ([name]) 11/08/2015  . S/P TAVR (transcatheter aortic valve replacement) 04/16/2016   26 mm Edwards Sapien 3 transcatheter heart valve placed via percutaneous right transfemoral approach   . Shortness of breath dyspnea    with exertion and when tired  . 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 Transfusion history    hx. 25 yrs ago-during cancer tx.    Past Surgical History:  Procedure Laterality Date  . BACK SURGERY     '14- rupt. disc   . CARDIAC CATHETERIZATION N/A 01/24/2016   Procedure: Right/Left Heart Cath and Coronary Angiography;  Surgeon: [name], MD;  Location: [facility];  Service: Cardiovascular;   Laterality: N/A;  . COLONOSCOPY    . EUS N/A 02/10/2014   Procedure: UPPER ENDOSCOPIC ULTRASOUND (EUS) LINEAR;  Surgeon: [name], MD;  [facility];  Service: Endoscopy;  Laterality: N/A;  . KNEE SURGERY Right    age 39 for Baker's cyst  . NASAL SEPTUM SURGERY     Deviated septrum  . TEE WITHOUT CARDIOVERSION N/A 04/16/2016   Procedure: TRANSESOPHAGEAL ECHOCARDIOGRAM (TEE);  Surgeon: [name], MD;  Location: [facility];  Service: Open Heart Surgery;  Laterality: N/A;  . THORACOTOMY  1990   wedge biopsy of mediastinal mass  . TRANSCATHETER AORTIC VALVE REPLACEMENT, TRANSFEMORAL N/A 04/16/2016   Procedure: TRANSCATHETER AORTIC VALVE REPLACEMENT, TRANSFEMORAL;  Surgeon: [name], MD;  Location: [facility];  Service: Open Heart Surgery;  Laterality: N/A;    Allergies: Penicillins  Medications: Prior to Admission medications   Medication Sig Start Date End Date Taking? Authorizing Provider  acetaminophen (TYLENOL) 500 MG tablet Take 1,000 mg by mouth every 6 (six) hours as needed for headache.    [provider]  aspirin EC 81 MG tablet Take 81 mg by mouth every evening.    [provider]  calcium carbonate (TUMS - DOSED IN MG ELEMENTAL CALCIUM) 500 MG chewable tablet Chew 3 tablets by mouth 3 (three) times daily as needed for indigestion or heartburn.     [provider]  clindamycin (CLEOCIN) 300 MG capsule Take 2 capsules by mouth one hour prior to dental appointment Patient taking differently: Take 600 mg by mouth as needed (dental  procedures). Take 2 capsules by mouth one hour prior to dental appointment 05/13/16   [name], MD  docusate sodium (COLACE) 100 MG capsule Take 100 mg by mouth daily as needed for mild constipation.    [provider]  ibuprofen (ADVIL,MOTRIN) 200 MG tablet Take 400 mg by mouth every 6 (six) hours as needed for headache or mild pain.     [provider]  losartan (COZAAR) 50 MG tablet TAKE ONE  TABLET BY MOUTH DAILY. YEARLY PHYSICAL DUE IN APRIL, MUST SEE MD FOR FUTURE REFILLS. 12/23/16   [name], MD  metoprolol tartrate (LOPRESSOR) 25 MG tablet Take 0.5 tablets (12.5 mg total) by mouth 2 (two) times daily. 05/13/16   [name], MD  Octreotide Acetate (SANDOSTATIN IJ) Inject 1 each as directed every 30 (thirty) days.     [provider]  oxyCODONE (OXY IR/ROXICODONE) 5 MG immediate release tablet 1-2 tabs PO Q 6 hours PRN pain. Patient not taking: Reported on 12/02/2016 07/22/16   [name], NP  promethazine-codeine [facility] WITH CODEINE) 6.25-10 MG/5ML syrup Take 5 mLs by mouth every 6 (six) hours as needed. Patient not taking: Reported on 12/02/2016 10/22/16   [name], FNP  simvastatin (ZOCOR) 20 MG tablet Take 1 tablet (20 mg total) by mouth daily at 6 PM. Yearly physical due in April must see MD for refills 09/30/16   [name], MD  traMADol (ULTRAM) 50 MG tablet Take 1 tablet (50 mg total) by mouth every 4 (four) hours as needed for moderate pain. Patient not taking: Reported on 12/02/2016 04/18/16   [name], PA-C     Family History  Problem Relation Age of Onset  . Dementia Mother   . Cancer Paternal Grandfather        esophagus  . Cancer Maternal Aunt        colon  . Emphysema Maternal Aunt     Social History   Social History  . Marital status: Married    Spouse name: N/A  . Number of children: 3  . Years of education: 38   Occupational History  . [employer]   Social History Main Topics  . Smoking status: Never Smoker  . Smokeless tobacco: Never Used  . Alcohol use Yes     Comment: rare- social  . Drug use: No  . Sexual activity: Yes    Partners: Female   Other Topics Concern  . Not on file   Social History Narrative   Ronald Thompson. 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 2 sons: 1979, 1985- married with 2 daughters 1 in route: Oldest in [place], younger [place], [place]. 1 daughter: [address] [place], married-1  granddaughter.Customer service manager- Nurse, mental health. SO- good health; marriage in good health   Enjoys watching movies, works out at home gym    ECOG Status: 0 - Asymptomatic  Review of Systems: A 12 point ROS discussed and pertinent positives are indicated in the HPI above.  All other systems are negative.  Review of Systems  No symptoms of flushing or diarrhea. Some fatigue.  Vital Signs: There were no vitals taken for this visit.  Physical Exam Well-nourished patient in good physical condition. Actively exercising.  Imaging: Positive data Ga 68 PET scan with active liver metastasis and skeletal metastasis.    Labs:        CBC:  Recent Labs  04/12/16 1622  04/16/16 1051 04/16/16 1057 04/17/16 0405 07/22/16 1132  WBC 6.3  --   --  6.4 8.2 7.3  HGB 14.5  < > 13.6 13.6 14.0 15.5  HCT 42.1  < > 40.0 40.3 41.8 47.0  PLT 150  --   --  122* 122* 182  < > = values in this interval not displayed. .la COAGS:  Recent Labs  01/12/16 1302 04/12/16 1622 04/16/16 1057  INR 1.04 1.11 1.30  APTT  --  32 34   . BMP:  Recent Labs  02/29/16 0940  04/12/16 1622 04/16/16 0804 04/16/16 0903 04/16/16 0934 04/16/16 1051 04/17/16 0405 07/22/16 1132  NA 140  --  138 139 136 135 139 139 139  K 4.4  --  3.9 4.1 4.6 5.0 4.2 4.3 4.7  CL  --   --  103 99* 100* 99*  --  101  --   CO2 29  --  26  --   --   --   --  32 29  GLUCOSE 97  --  129* 104* 167* 186* 127* 111* 119  BUN 16.3  --  20 21* 21* 20  --  10 21.3  CALCIUM 9.7  --  9.2  --   --   --   --  8.7* 9.5  CREATININE 1.0  < > 0.99 0.60* 0.60* 0.70  --  0.95 1.0  GFRNONAA  --   --  >60  --   --   --   --  >60  --   GFRAA  --   --  >60  --   --   --   --  >60  --   < > = values in this interval not displayed.  LIVER FUNCTION TESTS:  Recent Labs  04/12/16 1622 07/22/16 1132  BILITOT 1.2 1.27*  AST 31 25  ALT 22 24  ALKPHOS 56 81  PROT 6.1* 7.2  ALBUMIN 4.1 4.0    TUMOR MARKERS:  Assessment and Plan:   Metastatic  neuroendocrine tumor. Neuroendocrine tumor is avid for the DOTATE molecule as evidence by the positive DOTATE PET Scan. Patient is a good candidate for PRRT therapy with current lab work adequate for proceeding with therapy. Patient has no current symptoms of flushing or diarrhea. Hold Sandostatin therapy (last dose 12/26/2016) Patient was advised risks and benefits and procedures as well as adverse effects. Patient tentatively scheduled for 01/23/2017.   Thank you for this interesting consult.  I greatly enjoyed meeting Savoy [place] and look forward to participating in their care.  A copy of this report was sent to the requesting provider on this date.  Electronically Signed: Halina Andreas, MD 01/08/2017, 12:22 PM   I spent a total of  30 Minutes   in face to face in clinical consultation, greater than 50% of which was counseling/coordinating care for metastatic neuroendocrine tumor, PRRT.

## 2017-01-08 NOTE — Progress Notes (Signed)
Subjective:    Patient ID: Ronald Thompson, male    DOB: 15-Feb-1956, 61 y.o.   MRN: [id number]  HPI  Here for yearly f/u;  Overall doing ok;  Pt denies Chest pain, worsening SOB, DOE, wheezing, orthopnea, PND, worsening LE edema, palpitations, dizziness or syncope.  Pt denies neurological change such as new headache, facial or extremity weakness.  Pt denies polydipsia, polyuria, or low sugar symptoms. Pt states overall good compliance with treatment and medications, good tolerability, and has been trying to follow appropriate diet.  Pt denies worsening depressive symptoms, suicidal ideation or panic. No fever, night sweats, wt loss, loss of appetite, or other constitutional symptoms.  Pt states good ability with ADL's, has low fall risk, home safety reviewed and adequate, no other significant changes in hearing or vision, little exercise Wt Readings from Last 3 Encounters:  01/08/17 182 lb (82.6 kg)  12/26/16 183 lb 12.8 oz (83.4 kg)  11/26/16 187 lb 6.3 oz (85 kg)  Wife has early onset dementia, he wants to live as long as he can for her.  Has ongoing regular f/u with oncology and has stable disease per pt Does c/o ongoing fatigue, but denies signficant daytime hypersomnolence.  Past Medical History:  Diagnosis Date  . Baker's cyst   . Gallstones   . Heart murmur   . Hypercholesteremia   . Hypertension   . Lesion of left lung    hx.25 yrs ago- testicilar cancer related- only scarring left after tx.-no problems now  . Liver metastasis ([place])   . 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  . Pericarditis    2nd to tumor  . Pneumonia 1990  . Primary neuroendocrine tumor of pancreas 02/14/2014   Stage IV with multiple mets to liver  . Pulmonary fibrosis ([place]) 11/08/2015  . S/P TAVR (transcatheter aortic valve replacement) 04/16/2016   26 mm Edwards Sapien 3 transcatheter heart valve placed via percutaneous right  transfemoral approach   . Shortness of breath dyspnea    with exertion and when tired  . 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 Transfusion history    hx. 25 yrs ago-during cancer tx.   Past Surgical History:  Procedure Laterality Date  . BACK SURGERY     '14- rupt. disc   . CARDIAC CATHETERIZATION N/A 01/24/2016   Procedure: Right/Left Heart Cath and Coronary Angiography;  Surgeon: [name], MD;  Location: [facility];  Service: Cardiovascular;  Laterality: N/A;  . COLONOSCOPY    . EUS N/A 02/10/2014   Procedure: UPPER ENDOSCOPIC ULTRASOUND (EUS) LINEAR;  Surgeon: [name], MD;  [facility];  Service: Endoscopy;  Laterality: N/A;  . KNEE SURGERY Right    age 44 for Baker's cyst  . NASAL SEPTUM SURGERY     Deviated septrum  . TEE WITHOUT CARDIOVERSION N/A 04/16/2016   Procedure: TRANSESOPHAGEAL ECHOCARDIOGRAM (TEE);  Surgeon: [name], MD;  Location: [facility];  Service: Open Heart Surgery;  Laterality: N/A;  . THORACOTOMY  1990   wedge biopsy of mediastinal mass  . TRANSCATHETER AORTIC VALVE REPLACEMENT, TRANSFEMORAL N/A 04/16/2016   Procedure: TRANSCATHETER AORTIC VALVE REPLACEMENT, TRANSFEMORAL;  Surgeon: [name], MD;  Location: [facility];  Service: Open Heart Surgery;  Laterality: N/A;    reports that he has never smoked. He has never used smokeless tobacco. He reports  that he drinks alcohol. He reports that he does not use drugs. family history includes Cancer in his maternal aunt and paternal grandfather; Dementia in his mother; Emphysema in his maternal aunt. Allergies  Allergen Reactions  . Penicillins Hives    ENTIRE BODY Has patient had a PCN reaction causing immediate rash, facial/tongue/throat swelling, SOB or lightheadedness with hypotension: No Has patient had a PCN reaction causing severe rash involving mucus membranes or skin necrosis: No Has patient had a PCN  reaction that required hospitalization * *  YES  * * Has patient had a PCN reaction occurring within the last 10 years: No If all of the above answers are "NO", then may proceed with Cephalosporin use. *reaction occurred when he was 17   Current Outpatient Prescriptions on File Prior to Visit  Medication Sig Dispense Refill  . acetaminophen (TYLENOL) 500 MG tablet Take 1,000 mg by mouth every 6 (six) hours as needed for headache.    Marland Kitchen aspirin EC 81 MG tablet Take 81 mg by mouth every evening.    . calcium carbonate (TUMS - DOSED IN MG ELEMENTAL CALCIUM) 500 MG chewable tablet Chew 3 tablets by mouth 3 (three) times daily as needed for indigestion or heartburn.     . clindamycin (CLEOCIN) 300 MG capsule Take 2 capsules by mouth one hour prior to dental appointment (Patient taking differently: Take 600 mg by mouth as needed (dental procedures). Take 2 capsules by mouth one hour prior to dental appointment) 6 capsule 2  . docusate sodium (COLACE) 100 MG capsule Take 100 mg by mouth daily as needed for mild constipation.    Marland Kitchen ibuprofen (ADVIL,MOTRIN) 200 MG tablet Take 400 mg by mouth every 6 (six) hours as needed for headache or mild pain.     Marland Kitchen losartan (COZAAR) 50 MG tablet TAKE ONE TABLET BY MOUTH DAILY. YEARLY PHYSICAL DUE IN APRIL, MUST SEE MD FOR FUTURE REFILLS. 30 tablet 0  . metoprolol tartrate (LOPRESSOR) 25 MG tablet Take 0.5 tablets (12.5 mg total) by mouth 2 (two) times daily. 60 tablet 11  . Octreotide Acetate (SANDOSTATIN IJ) Inject 1 each as directed every 30 (thirty) days.     Marland Kitchen oxyCODONE (OXY IR/ROXICODONE) 5 MG immediate release tablet 1-2 tabs PO Q 6 hours PRN pain. (Patient not taking: Reported on 12/02/2016) 45 tablet 0  . promethazine-codeine (PHENERGAN WITH CODEINE) 6.25-10 MG/5ML syrup Take 5 mLs by mouth every 6 (six) hours as needed. (Patient not taking: Reported on 12/02/2016) 180 mL 0  . simvastatin (ZOCOR) 20 MG tablet Take 1 tablet (20 mg total) by mouth daily at 6 PM.  Yearly physical due in April must see MD for refills 90 tablet 0  . traMADol (ULTRAM) 50 MG tablet Take 1 tablet (50 mg total) by mouth every 4 (four) hours as needed for moderate pain. (Patient not taking: Reported on 12/02/2016) 28 tablet 0   No current facility-administered medications on file prior to visit.    Review of Systems  Constitutional: Negative for other unusual diaphoresis or sweats HENT: Negative for ear discharge or swelling Eyes: Negative for other worsening visual disturbances Respiratory: Negative for stridor or other swelling  Gastrointestinal: Negative for worsening distension or other blood Genitourinary: Negative for retention or other urinary change Musculoskeletal: Negative for other MSK pain or swelling Skin: Negative for color change or other new lesions Neurological: Negative for worsening tremors and other numbness  Psychiatric/Behavioral: Negative for worsening agitation or other fatigue All other system neg per pt  Objective:   Physical Exam BP 124/86   Pulse 72   Ht 5\' 10"  (1.778 m)   Wt 182 lb (82.6 kg)   SpO2 98%   BMI 26.11 kg/m  VS noted,  Constitutional: Pt appears in NAD HENT: Head: NCAT.  Right Ear: External ear normal.  Left Ear: External ear normal.  Eyes: . Pupils are equal, round, and reactive to light. Conjunctivae and EOM are normal Nose: without d/c or deformity Neck: Neck supple. Gross normal ROM Cardiovascular: Normal rate and regular rhythm.   Pulmonary/Chest: Effort normal and breath sounds without rales or wheezing.  Abd:  Soft, NT, ND, + BS, no organomegaly Neurological: Pt is alert. At baseline orientation, motor grossly intact Skin: Skin is warm. No rashes, other new lesions, no LE edema Psychiatric: Pt behavior is normal without agitation , mild nervous No other exam findings    Assessment & Plan:

## 2017-01-10 ENCOUNTER — Encounter (HOSPITAL_COMMUNITY)
Admission: RE | Admit: 2017-01-10 | Discharge: 2017-01-10 | Disposition: A | Discharge status: Home / Self Care | Payer: Medicare Other | Source: Ambulatory Visit | Attending: Cardiovascular Disease | Admitting: Cardiovascular Disease

## 2017-01-10 DIAGNOSIS — Z952 Presence of prosthetic heart valve: Secondary | ICD-10-CM | POA: Diagnosis not present

## 2017-01-11 NOTE — Assessment & Plan Note (Signed)
stable overall by history and exam, recent data reviewed with pt, and pt to continue medical treatment as before,  to f/u any worsening symptoms or concerns BP Readings from Last 3 Encounters:  01/08/17 124/86  12/26/16 140/78  11/28/16 129/80

## 2017-01-11 NOTE — Assessment & Plan Note (Signed)
stable overall by history and exam, recent data reviewed with pt, and pt to continue medical treatment as before,  to f/u any worsening symptoms or concerns Lab Results  Component Value Date   HGBA1C 6.2 01/08/2017   

## 2017-01-11 NOTE — Assessment & Plan Note (Signed)
stable overall by history and exam, recent data reviewed with pt, and pt to continue medical treatment as before,  to f/u any worsening symptoms or concerns Lab Results  Component Value Date   LDLCALC 71 01/08/2017   

## 2017-01-11 NOTE — Assessment & Plan Note (Signed)
Pt to continue regular oncology f/u

## 2017-01-11 NOTE — Assessment & Plan Note (Signed)
Overall exam benign, for labs as ordered,  to f/u any worsening symptoms or concerns

## 2017-01-13 ENCOUNTER — Encounter (HOSPITAL_COMMUNITY)
Admission: RE | Admit: 2017-01-13 | Discharge: 2017-01-13 | Disposition: A | Discharge status: Home / Self Care | Payer: Medicare Other | Source: Ambulatory Visit | Attending: Cardiovascular Disease | Admitting: Cardiovascular Disease

## 2017-01-13 DIAGNOSIS — Z952 Presence of prosthetic heart valve: Secondary | ICD-10-CM

## 2017-01-15 ENCOUNTER — Encounter (HOSPITAL_COMMUNITY)
Admission: RE | Admit: 2017-01-15 | Discharge: 2017-01-15 | Disposition: A | Discharge status: Home / Self Care | Payer: Medicare Other | Source: Ambulatory Visit | Attending: Cardiovascular Disease | Admitting: Cardiovascular Disease

## 2017-01-15 DIAGNOSIS — Z952 Presence of prosthetic heart valve: Secondary | ICD-10-CM

## 2017-01-17 ENCOUNTER — Encounter (HOSPITAL_COMMUNITY)
Admission: RE | Admit: 2017-01-17 | Discharge: 2017-01-17 | Disposition: A | Discharge status: Home / Self Care | Payer: Medicare Other | Source: Ambulatory Visit | Attending: Cardiovascular Disease | Admitting: Cardiovascular Disease

## 2017-01-17 DIAGNOSIS — Z952 Presence of prosthetic heart valve: Secondary | ICD-10-CM | POA: Diagnosis not present

## 2017-01-20 ENCOUNTER — Encounter (HOSPITAL_COMMUNITY)
Admission: RE | Admit: 2017-01-20 | Discharge: 2017-01-20 | Disposition: A | Discharge status: Home / Self Care | Payer: Medicare Other | Source: Ambulatory Visit | Attending: Cardiovascular Disease | Admitting: Cardiovascular Disease

## 2017-01-20 DIAGNOSIS — Z952 Presence of prosthetic heart valve: Secondary | ICD-10-CM

## 2017-01-21 ENCOUNTER — Other Ambulatory Visit (HOSPITAL_COMMUNITY): Payer: Self-pay | Admitting: Diagnostic Radiology

## 2017-01-21 NOTE — Progress Notes (Signed)
Cardiac Individual Treatment Plan  Patient Details  Name: Ronald Thompson MRN: [id number] Date of Birth: [birth year] Referring Provider:     CARDIAC REHAB PHASE II ORIENTATION from 11/26/2016 in [facility]  Referring Provider  [name], MD.      Initial Encounter Date:    CARDIAC REHAB PHASE II ORIENTATION from 11/26/2016 in [facility]  Date  11/26/16  Referring Provider  [name], MD.      Visit Diagnosis: 04/16/16  S/P TAVR (transcatheter aortic valve replacement)  Patient's Home Medications on Admission:  Current Outpatient Prescriptions:  .  acetaminophen (TYLENOL) 500 MG tablet, Take 1,000 mg by mouth every 6 (six) hours as needed for headache., Disp: , Rfl:  .  aspirin EC 81 MG tablet, Take 81 mg by mouth every evening., Disp: , Rfl:  .  calcium carbonate (TUMS - DOSED IN MG ELEMENTAL CALCIUM) 500 MG chewable tablet, Chew 3 tablets by mouth 3 (three) times daily as needed for indigestion or heartburn. , Disp: , Rfl:  .  clindamycin (CLEOCIN) 300 MG capsule, Take 2 capsules by mouth one hour prior to dental appointment (Patient taking differently: Take 600 mg by mouth as needed (dental procedures). Take 2 capsules by mouth one hour prior to dental appointment), Disp: 6 capsule, Rfl: 2 .  docusate sodium (COLACE) 100 MG capsule, Take 100 mg by mouth daily as needed for mild constipation., Disp: , Rfl:  .  ibuprofen (ADVIL,MOTRIN) 200 MG tablet, Take 400 mg by mouth every 6 (six) hours as needed for headache or mild pain. , Disp: , Rfl:  .  losartan (COZAAR) 50 MG tablet, TAKE ONE TABLET BY MOUTH DAILY. YEARLY PHYSICAL DUE IN [place], MUST SEE MD FOR FUTURE REFILLS., Disp: 30 tablet, Rfl: 0 .  metoprolol tartrate (LOPRESSOR) 25 MG tablet, Take 0.5 tablets (12.5 mg total) by mouth 2 (two) times daily., Disp: 60 tablet, Rfl: 11 .  Octreotide Acetate (SANDOSTATIN IJ), Inject 1 each as directed every 30 (thirty) days. ,  Disp: , Rfl:  .  oxyCODONE (OXY IR/ROXICODONE) 5 MG immediate release tablet, 1-2 tabs PO Q 6 hours PRN pain. (Patient not taking: Reported on 12/02/2016), Disp: 45 tablet, Rfl: 0 .  promethazine-codeine (PHENERGAN WITH CODEINE) 6.25-10 MG/5ML syrup, Take 5 mLs by mouth every 6 (six) hours as needed. (Patient not taking: Reported on 12/02/2016), Disp: 180 mL, Rfl: 0 .  simvastatin (ZOCOR) 20 MG tablet, Take 1 tablet (20 mg total) by mouth daily at 6 PM. Yearly physical due in April must see MD for refills, Disp: 90 tablet, Rfl: 0 .  traMADol (ULTRAM) 50 MG tablet, Take 1 tablet (50 mg total) by mouth every 4 (four) hours as needed for moderate pain. (Patient not taking: Reported on 12/02/2016), Disp: 28 tablet, Rfl: 0  Past Medical History: Past Medical History:  Diagnosis Date  . Baker's cyst   . Gallstones   . Heart murmur   . Hypercholesteremia   . Hypertension   . Lesion of left lung    hx.25 yrs ago- testicilar cancer related- only scarring left after tx.-no problems now  . Liver metastasis ([place])   . 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  . Pericarditis    2nd to tumor  . Pneumonia 1990  . Primary neuroendocrine tumor of pancreas 02/14/2014   Stage IV with multiple mets to liver  .  Pulmonary fibrosis ([facility]) 11/08/2015  . S/P TAVR (transcatheter aortic valve replacement) 04/16/2016   26 mm Edwards Sapien 3 transcatheter heart valve placed via percutaneous right transfemoral approach   . Shortness of breath dyspnea    with exertion and when tired  . 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 Transfusion history    hx. 25 yrs ago-during cancer tx.    Tobacco Use: History  Smoking Status  . Never Smoker  Smokeless Tobacco  . Never Used    Labs: Recent Review Flowsheet Data    Labs for ITP Cardiac and Pulmonary Rehab Latest  Ref Rng & Units 04/16/2016 04/16/2016 04/16/2016 04/16/2016 01/08/2017   Cholestrol 0 - 200 mg/dL - - - - 131   LDLCALC 0 - 99 mg/dL - - - - 71   LDLDIRECT mg/dL - - - - -   HDL >39.00 mg/dL - - - - 48.80   Trlycerides 0.0 - 149.0 mg/dL - - - - 56.0   Hemoglobin A1c 4.6 - 6.5 % - - - - 6.2   PHART 7.350 - 7.450 7.367 - - 7.345(L) -   PCO2ART 32.0 - 48.0 mmHg 53.1(H) - - 54.9(H) -   HCO3 20.0 - 28.0 mmol/L 30.4(H) - - 30.0(H) -   TCO2 0 - 100 mmol/L 32 30 29 32 -   O2SAT % 100.0 - - 96.0 -      Capillary Blood Glucose: No results found for: GLUCAP   Exercise Target Goals:    Exercise Program Goal: Individual exercise prescription set with THRR, safety & activity barriers. Participant demonstrates ability to understand and report RPE using BORG scale, to self-measure pulse accurately, and to acknowledge the importance of the exercise prescription.  Exercise Prescription Goal: Starting with aerobic activity 30 plus minutes a day, 3 days per week for initial exercise prescription. Provide home exercise prescription and guidelines that participant acknowledges understanding prior to discharge.  Activity Barriers & Risk Stratification:     Activity Barriers & Cardiac Risk Stratification - 11/26/16 0855      Activity Barriers & Cardiac Risk Stratification   Activity Barriers None   Cardiac Risk Stratification High      6 Minute Walk:     6 Minute Walk    Row Name 11/26/16 1415         6 Minute Walk   Phase Initial     Distance 1843 feet     Walk Time 6 minutes     # of Rest Breaks 0     MPH 3.49     METS 4.41     RPE 11     VO2 Peak 15.43     Symptoms No     Resting HR 70 bpm     Resting BP 124/84     Max Ex. HR 104 bpm     Max Ex. BP 142/94     2 Minute Post BP 142/78        Oxygen Initial Assessment:   Oxygen Re-Evaluation:   Oxygen Discharge (Final Oxygen Re-Evaluation):   Initial Exercise Prescription:     Initial Exercise Prescription - 11/26/16  1400      Date of Initial Exercise RX and Referring Provider   Date 11/26/16   Referring Provider [name], MD.     Treadmill   MPH 2.4   Grade 2   Minutes 10   METs 3.5  Bike   Level 1.5   Minutes 10   METs 4.33     NuStep   Level 4   SPM 90   Minutes 10   METs 3     Prescription Details   Frequency (times per week) 3   Duration Progress to 30 minutes of continuous aerobic without signs/symptoms of physical distress     Intensity   THRR 40-80% of Max Heartrate 63-126   Ratings of Perceived Exertion 11-13   Perceived Dyspnea 0-4     Progression   Progression Continue to progress workloads to maintain intensity without signs/symptoms of physical distress.     Resistance Training   Training Prescription Yes   Weight 5lb   Reps 10-15      Perform Capillary Blood Glucose checks as needed.  Exercise Prescription Changes:     Exercise Prescription Changes    Row Name 12/09/16 1600 01/13/17 1600           Response to Exercise   Blood Pressure (Admit) 112/64 120/80      Blood Pressure (Exercise) 134/88 150/90      Blood Pressure (Exit) 126/82 128/82      Heart Rate (Admit) 66 bpm 79 bpm      Heart Rate (Exercise) 110 bpm 118 bpm      Heart Rate (Exit) 75 bpm 80 bpm      Rating of Perceived Exertion (Exercise) 11 12      Duration Progress to 45 minutes of aerobic exercise without signs/symptoms of physical distress Progress to 45 minutes of aerobic exercise without signs/symptoms of physical distress      Intensity THRR unchanged THRR unchanged        Progression   Progression Continue to progress workloads to maintain intensity without signs/symptoms of physical distress. Continue to progress workloads to maintain intensity without signs/symptoms of physical distress.      Average METs 2.8 4.1        Resistance Training   Training Prescription Yes Yes      Weight 5lb 5lb      Reps 10-15 10-15        Treadmill   MPH 3 3      Grade 0 0       Minutes 10 10      METs 3.3 3.3        Bike   Level 1 1      Minutes 10 10      METs 3.7 3.7        NuStep   Level 1 1      SPM 90 90      Minutes 10 10      METs 3.1 3.1        Home Exercise Plan   Plans to continue exercise at  - Home (comment)      Frequency  - Add 3 additional days to program exercise sessions.      Initial Home Exercises Provided  - 12/13/16         Exercise Comments:     Exercise Comments    Row Name 12/25/16 0749 01/20/17 0857         Exercise Comments Reviewed METs and goals with pt.  Pt is doing well with exercise.  Reviewed METs and goals with pt.          Exercise Goals and Review:     Exercise Goals    Row Name 11/26/16 [id number]  Exercise Goals   Increase Physical Activity Yes       Intervention Provide advice, education, support and counseling about physical activity/exercise needs.;Develop an individualized exercise prescription for aerobic and resistive training based on initial evaluation findings, risk stratification, comorbidities and participant's personal goals.       Expected Outcomes Achievement of increased cardiorespiratory fitness and enhanced flexibility, muscular endurance and strength shown through measurements of functional capacity and personal statement of participant.       Increase Strength and Stamina Yes  Be able to climb stairs; improve overall cardiorespiratory fitness       Intervention Provide advice, education, support and counseling about physical activity/exercise needs.;Develop an individualized exercise prescription for aerobic and resistive training based on initial evaluation findings, risk stratification, comorbidities and participant's personal goals.       Expected Outcomes Achievement of increased cardiorespiratory fitness and enhanced flexibility, muscular endurance and strength shown through measurements of functional capacity and personal statement of participant.          Exercise Goals  Re-Evaluation :     Exercise Goals Re-Evaluation    Row Name 12/25/16 0747 01/20/17 0856           Exercise Goal Re-Evaluation   Exercise Goals Review Increase Physical Activity;Increase Strenth and Stamina Increase Physical Activity;Increase Strenth and Stamina      Comments Pt states he feels more comfortable with aerobic exercise and he is tolerating workload increases well.  He states he does resistance training 3xs/week at home on his off days from CR and he walks 48min/day with his spouse. Pt is doing great with exercise and is tolerating workload increases well.  His BP has also improved with exercise and he states he is still doing RT at home on [place], [place].      Expected Outcomes Continue with exercises Rx, HEP and increase workloads as tolerated in order to increase cardiorespiratory fitness level Continue with exercises Rx, HEP and increase workloads as tolerated in order to increase cardiorespiratory fitness level          Discharge Exercise Prescription (Final Exercise Prescription Changes):     Exercise Prescription Changes - 01/13/17 1600      Response to Exercise   Blood Pressure (Admit) 120/80   Blood Pressure (Exercise) 150/90   Blood Pressure (Exit) 128/82   Heart Rate (Admit) 79 bpm   Heart Rate (Exercise) 118 bpm   Heart Rate (Exit) 80 bpm   Rating of Perceived Exertion (Exercise) 12   Duration Progress to 45 minutes of aerobic exercise without signs/symptoms of physical distress   Intensity THRR unchanged     Progression   Progression Continue to progress workloads to maintain intensity without signs/symptoms of physical distress.   Average METs 4.1     Resistance Training   Training Prescription Yes   Weight 5lb   Reps 10-15     Treadmill   MPH 3   Grade 0   Minutes 10   METs 3.3     Bike   Level 1   Minutes 10   METs 3.7     NuStep   Level 1   SPM 90   Minutes 10   METs 3.1     Home Exercise Plan   Plans to continue exercise at  Home (comment)   Frequency Add 3 additional days to program exercise sessions.   Initial Home Exercises Provided 12/13/16      Nutrition:  Target Goals: Understanding of nutrition guidelines,  daily intake of sodium 1500mg , cholesterol 200mg , calories 30% from fat and 7% or less from saturated fats, daily to have 5 or more servings of fruits and vegetables.  Biometrics:     Pre Biometrics - 11/26/16 1423      Pre Biometrics   Height 5' 9.5" (1.765 m)   Weight 187 lb 6.3 oz (85 kg)   Waist Circumference 36.75 inches   Hip Circumference 41 inches   Waist to Hip Ratio 0.9 %   BMI (Calculated) 27.3   Triceps Skinfold 15 mm   % Body Fat 25.5 %   Grip Strength 50 kg   Flexibility 7.5 in   Single Leg Stand 19.84 seconds       Nutrition Therapy Plan and Nutrition Goals:     Nutrition Therapy & Goals - 01/01/17 0810      Nutrition Therapy   Diet Therapeutic Lifestyle Changes     Personal Nutrition Goals   Nutrition Goal Pt to maintain his current wt while in Cardiac Rehab   Personal Goal #2 Pt to identify and limit food sources of saturated fat, trans fat, and sodium     Intervention Plan   Intervention Prescribe, educate and counsel regarding individualized specific dietary modifications aiming towards targeted core components such as weight, hypertension, lipid management, diabetes, heart failure and other comorbidities.   Expected Outcomes Short Term Goal: Understand basic principles of dietary content, such as calories, fat, sodium, cholesterol and nutrients.;Long Term Goal: Adherence to prescribed nutrition plan.      Nutrition Discharge: Nutrition Scores:     Nutrition Assessments - 01/01/17 0810      MEDFICTS Scores   Pre Score 46      Nutrition Goals Re-Evaluation:   Nutrition Goals Re-Evaluation:   Nutrition Goals Discharge (Final Nutrition Goals Re-Evaluation):   Psychosocial: Target Goals: Acknowledge presence or absence of significant depression  and/or stress, maximize coping skills, provide positive support system. Participant is able to verbalize types and ability to use techniques and skills needed for reducing stress and depression.  Initial Review & Psychosocial Screening:     Initial Psych Review & Screening - 11/26/16 1705      Initial Review   Current issues with None Identified     Family Dynamics   Good Support System? Yes     Barriers   Psychosocial barriers to participate in program There are no identifiable barriers or psychosocial needs.     Screening Interventions   Interventions Encouraged to exercise      Quality of Life Scores:     Quality of Life - 11/26/16 0955      Quality of Life Scores   Health/Function Pre 14.43 %   Socioeconomic Pre 24.43 %   Psych/Spiritual Pre 21.21 %   Family Pre 22.8 %   GLOBAL Pre 19.12 %      PHQ-9: Recent Review Flowsheet Data    Depression screen [facility] 2/9 01/08/2017 02/03/2014   Decreased Interest 0 0   Down, Depressed, Hopeless 0 0   PHQ - 2 Score 0 0   Altered sleeping 0 -   Tired, decreased energy 0 -   Change in appetite 0 -   Feeling bad or failure about yourself  0 -   Trouble concentrating 0 -   Moving slowly or fidgety/restless 0 -   Suicidal thoughts 0 -   PHQ-9 Score 0 -     Interpretation of Total Score  Total Score Depression Severity:  1-4 = Minimal  depression, 5-9 = Mild depression, 10-14 = Moderate depression, 15-19 = Moderately severe depression, 20-27 = Severe depression   Psychosocial Evaluation and Intervention:     Psychosocial Evaluation - 12/02/16 1115      Psychosocial Evaluation & Interventions   Interventions Encouraged to exercise with the program and follow exercise prescription   Comments no psychosocial needs identified, no interventions necessary    Expected Outcomes pt will demonstrate positive outlook with good coping skills.    Continue Psychosocial Services  No Follow up required      Psychosocial  Re-Evaluation:     Psychosocial Re-Evaluation    [place] Name 12/24/16 1054 01/08/17 1028           Psychosocial Re-Evaluation   Current issues with None Identified None Identified      Comments pt displays eagerness to particiate in CR activities.   pt displays eagerness to particiate in [facility] activities.        Expected Outcomes pt will exhibit positive outlook with good coping skills.  pt will exhibit positive outlook with good coping skills.       Interventions Encouraged to attend Cardiac Rehabilitation for the exercise Encouraged to attend Cardiac Rehabilitation for the exercise         Psychosocial Discharge (Final Psychosocial Re-Evaluation):     Psychosocial Re-Evaluation - 01/08/17 1028      Psychosocial Re-Evaluation   Current issues with None Identified   Comments pt displays eagerness to particiate in CR activities.     Expected Outcomes pt will exhibit positive outlook with good coping skills.    Interventions Encouraged to attend Cardiac Rehabilitation for the exercise      Vocational Rehabilitation: Provide vocational rehab assistance to qualifying candidates.   Vocational Rehab Evaluation & Intervention:     Vocational Rehab - 11/26/16 1704      Initial Vocational Rehab Evaluation & Intervention   Assessment shows need for Vocational Rehabilitation No      Education: Education Goals: Education classes will be provided on a weekly basis, covering required topics. Participant will state understanding/return demonstration of topics presented.  Learning Barriers/Preferences:     Learning Barriers/Preferences - 11/26/16 0849      Learning Barriers/Preferences   Learning Barriers Sight   Learning Preferences Written Material      Education Topics: Count Your Pulse:  -Group instruction provided by verbal instruction, demonstration, patient participation and written materials to support subject.  Instructors address importance of being able to find your  pulse and how to count your pulse when at home without a heart monitor.  Patients get hands on experience counting their pulse with staff help and individually.   Heart Attack, Angina, and Risk Factor Modification:  -Group instruction provided by verbal instruction, video, and written materials to support subject.  Instructors address signs and symptoms of angina and heart attacks.    Also discuss risk factors for heart disease and how to make changes to improve heart health risk factors.   Functional Fitness:  -Group instruction provided by verbal instruction, demonstration, patient participation, and written materials to support subject.  Instructors address safety measures for doing things around the house.  Discuss how to get up and down off the floor, how to pick things up properly, how to safely get out of a chair without assistance, and balance training.   Meditation and Mindfulness:  -Group instruction provided by verbal instruction, patient participation, and written materials to support subject.  Instructor addresses importance of mindfulness and meditation  practice to help reduce stress and improve awareness.  Instructor also leads participants through a meditation exercise.    Stretching for Flexibility and Mobility:  -Group instruction provided by verbal instruction, patient participation, and written materials to support subject.  Instructors lead participants through series of stretches that are designed to increase flexibility thus improving mobility.  These stretches are additional exercise for major muscle groups that are typically performed during regular warm up and cool down.   Hands Only CPR:  -Group verbal, video, and participation provides a basic overview of AHA guidelines for community CPR. Role-play of emergencies allow participants the opportunity to practice calling for help and chest compression technique with discussion of AED use.   Hypertension: -Group verbal  and written instruction that provides a basic overview of hypertension including the most recent diagnostic guidelines, risk factor reduction with self-care instructions and medication management.    Nutrition I class: Heart Healthy Eating:  -Group instruction provided by PowerPoint slides, verbal discussion, and written materials to support subject matter. The instructor gives an explanation and review of the Therapeutic Lifestyle Changes diet recommendations, which includes a discussion on lipid goals, dietary fat, sodium, fiber, plant stanol/sterol esters, sugar, and the components of a well-balanced, healthy diet.   CARDIAC REHAB PHASE II EXERCISE from 01/01/2017 in [facility]  Date  01/01/17  Educator  RD  Instruction Review Code  Not applicable [class handouts given]      Nutrition II class: Lifestyle Skills:  -Group instruction provided by PowerPoint slides, verbal discussion, and written materials to support subject matter. The instructor gives an explanation and review of label reading, grocery shopping for heart health, heart healthy recipe modifications, and ways to make healthier choices when eating out.   CARDIAC REHAB PHASE II EXERCISE from 01/01/2017 in [facility]  Date  01/01/17  Educator  RD  Instruction Review Code  Not applicable [class handouts given]      Diabetes Question & Answer:  -Group instruction provided by PowerPoint slides, verbal discussion, and written materials to support subject matter. The instructor gives an explanation and review of diabetes co-morbidities, pre- and post-prandial blood glucose goals, pre-exercise blood glucose goals, signs, symptoms, and treatment of hypoglycemia and hyperglycemia, and foot care basics.   Diabetes Blitz:  -Group instruction provided by PowerPoint slides, verbal discussion, and written materials to support subject matter. The instructor gives an explanation  and review of the physiology behind type 1 and type 2 diabetes, diabetes medications and rational behind using different medications, pre- and post-prandial blood glucose recommendations and Hemoglobin A1c goals, diabetes diet, and exercise including blood glucose guidelines for exercising safely.    Portion Distortion:  -Group instruction provided by PowerPoint slides, verbal discussion, written materials, and food models to support subject matter. The instructor gives an explanation of serving size versus portion size, changes in portions sizes over the last 20 years, and what consists of a serving from each food group.   Stress Management:  -Group instruction provided by verbal instruction, video, and written materials to support subject matter.  Instructors review role of stress in heart disease and how to cope with stress positively.     Exercising on Your Own:  -Group instruction provided by verbal instruction, power point, and written materials to support subject.  Instructors discuss benefits of exercise, components of exercise, frequency and intensity of exercise, and end points for exercise.  Also discuss use of nitroglycerin and activating EMS.  Review  options of places to exercise outside of rehab.  Review guidelines for sex with heart disease.   Cardiac Drugs I:  -Group instruction provided by verbal instruction and written materials to support subject.  Instructor reviews cardiac drug classes: antiplatelets, anticoagulants, beta blockers, and statins.  Instructor discusses reasons, side effects, and lifestyle considerations for each drug class.   Cardiac Drugs II:  -Group instruction provided by verbal instruction and written materials to support subject.  Instructor reviews cardiac drug classes: angiotensin converting enzyme inhibitors (ACE-I), angiotensin II receptor blockers (ARBs), nitrates, and calcium channel blockers.  Instructor discusses reasons, side effects, and lifestyle  considerations for each drug class.   Anatomy and Physiology of the Circulatory System:  Group verbal and written instruction and models provide basic cardiac anatomy and physiology, with the coronary electrical and arterial systems. Review of: AMI, Angina, Valve disease, Heart Failure, Peripheral Artery Disease, Cardiac Arrhythmia, Pacemakers, and the ICD.   Other Education:  -Group or individual verbal, written, or video instructions that support the educational goals of the cardiac rehab program.   Knowledge Questionnaire Score:     Knowledge Questionnaire Score - 11/26/16 0955      Knowledge Questionnaire Score   Pre Score 20/24      Core Components/Risk Factors/Patient Goals at Admission:     Personal Goals and Risk Factors at Admission - 11/26/16 1407      Core Components/Risk Factors/Patient Goals on Admission   Hypertension Yes   Intervention Provide education on lifestyle modifcations including regular physical activity/exercise, weight management, moderate sodium restriction and increased consumption of fresh fruit, vegetables, and low fat dairy, alcohol moderation, and smoking cessation.;Monitor prescription use compliance.   Expected Outcomes Short Term: Continued assessment and intervention until BP is < 140/10mm HG in hypertensive participants. < 130/82mm HG in hypertensive participants with diabetes, heart failure or chronic kidney disease.;Long Term: Maintenance of blood pressure at goal levels.   Lipids Yes   Intervention Provide education and support for participant on nutrition & aerobic/resistive exercise along with prescribed medications to achieve LDL 70mg , HDL >40mg .   Expected Outcomes Short Term: Participant states understanding of desired cholesterol values and is compliant with medications prescribed. Participant is following exercise prescription and nutrition guidelines.;Long Term: Cholesterol controlled with medications as prescribed, with individualized  exercise RX and with personalized nutrition plan. Value goals: LDL < 70mg , HDL > 40 mg.   Personal Goal Other Yes   Personal Goal Increase stamina: be able to climb stairs. Improve cardiorespiratory fitness.   Intervention Develop an individual exercise prescription for aerobic and resistive training based on evaluation findings, risk stratification, comorbidiites and participant's personal goals.   Expected Outcomes Achieve increased cardiorespiratory fitness and enhanced flexibilty, endurance and strength shown through measures of functional capacity and personal statements of participant.      Core Components/Risk Factors/Patient Goals Review:      Goals and Risk Factor Review    Row Name 12/24/16 1053 01/08/17 1027           Core Components/Risk Factors/Patient Goals Review   Personal Goals Review Hypertension;Lipids Hypertension;Lipids      Review pt demonstrates eagerness to participate in CR activities.  pt demonstrates eagerness to participate in CR activities. pt is exercising on his own at home doing yard work and weight lifting.  pt reports he has made dietary changes since meeting with Derek M[place], RD.       Expected Outcomes pt will participate in CR exercise, nutrition and lifestyle education opportunities to decrease  overall CAD RF.   pt will participate in CR exercise, nutrition and lifestyle education opportunities to decrease overall CAD RF.           Core Components/Risk Factors/Patient Goals at Discharge (Final Review):      Goals and Risk Factor Review - 01/08/17 1027      Core Components/Risk Factors/Patient Goals Review   Personal Goals Review Hypertension;Lipids   Review pt demonstrates eagerness to participate in CR activities. pt is exercising on his own at home doing yard work and weight lifting.  pt reports he has made dietary changes since meeting with Derek [place], RD.    Expected Outcomes pt will participate in CR exercise, nutrition and lifestyle  education opportunities to decrease overall CAD RF.        ITP Comments:     ITP Comments    Row Name 11/26/16 0846 01/21/17 0720         ITP Comments Medical Director, Dr. Fransico Him Medical Director, Dr. Fransico Him         Comments: Pt is making expected progress toward personal goals after completing 17 sessions. Recommend continued exercise and life style modification education including  stress management and relaxation techniques to decrease cardiac risk profile.

## 2017-01-22 ENCOUNTER — Encounter (HOSPITAL_COMMUNITY)
Admission: RE | Admit: 2017-01-22 | Discharge: 2017-01-22 | Disposition: A | Discharge status: Home / Self Care | Payer: Medicare Other | Source: Ambulatory Visit | Attending: Cardiovascular Disease | Admitting: Cardiovascular Disease

## 2017-01-22 DIAGNOSIS — Z952 Presence of prosthetic heart valve: Secondary | ICD-10-CM

## 2017-01-23 ENCOUNTER — Ambulatory Visit (HOSPITAL_BASED_OUTPATIENT_CLINIC_OR_DEPARTMENT_OTHER): Payer: Medicare Other

## 2017-01-23 VITALS — BP 101/76 | HR 70 | Temp 97.6°F | Resp 18

## 2017-01-23 DIAGNOSIS — C7A8 Other malignant neuroendocrine tumors: Secondary | ICD-10-CM | POA: Diagnosis not present

## 2017-01-23 DIAGNOSIS — C7B8 Other secondary neuroendocrine tumors: Secondary | ICD-10-CM

## 2017-01-23 DIAGNOSIS — D3A8 Other benign neuroendocrine tumors: Secondary | ICD-10-CM

## 2017-01-23 MED ORDER — OCTREOTIDE ACETATE 30 MG IM KIT
30.0000 mg | PACK | Freq: Once | INTRAMUSCULAR | Status: AC
  Administered 2017-01-23: 30 mg via INTRAMUSCULAR
  Filled 2017-01-23: qty 1

## 2017-01-24 ENCOUNTER — Encounter (HOSPITAL_COMMUNITY)
Admission: RE | Admit: 2017-01-24 | Discharge: 2017-01-24 | Disposition: A | Discharge status: Home / Self Care | Payer: Medicare Other | Source: Ambulatory Visit | Attending: Cardiovascular Disease | Admitting: Cardiovascular Disease

## 2017-01-24 DIAGNOSIS — Z952 Presence of prosthetic heart valve: Secondary | ICD-10-CM

## 2017-01-27 ENCOUNTER — Other Ambulatory Visit: Payer: Self-pay | Admitting: Internal Medicine

## 2017-01-27 ENCOUNTER — Encounter (HOSPITAL_COMMUNITY)
Admission: RE | Admit: 2017-01-27 | Discharge: 2017-01-27 | Disposition: A | Discharge status: Home / Self Care | Payer: Medicare Other | Source: Ambulatory Visit | Attending: Cardiovascular Disease | Admitting: Cardiovascular Disease

## 2017-01-27 DIAGNOSIS — Z952 Presence of prosthetic heart valve: Secondary | ICD-10-CM

## 2017-01-29 ENCOUNTER — Encounter (HOSPITAL_COMMUNITY)
Admission: RE | Admit: 2017-01-29 | Discharge: 2017-01-29 | Disposition: A | Discharge status: Home / Self Care | Payer: Medicare Other | Source: Ambulatory Visit | Attending: Cardiovascular Disease | Admitting: Cardiovascular Disease

## 2017-01-29 DIAGNOSIS — Z952 Presence of prosthetic heart valve: Secondary | ICD-10-CM | POA: Diagnosis not present

## 2017-01-31 ENCOUNTER — Encounter (HOSPITAL_COMMUNITY)
Admission: RE | Admit: 2017-01-31 | Discharge: 2017-01-31 | Disposition: A | Discharge status: Home / Self Care | Payer: Medicare Other | Source: Ambulatory Visit | Attending: Cardiovascular Disease | Admitting: Cardiovascular Disease

## 2017-01-31 DIAGNOSIS — Z952 Presence of prosthetic heart valve: Secondary | ICD-10-CM | POA: Diagnosis not present

## 2017-02-03 ENCOUNTER — Encounter (HOSPITAL_COMMUNITY): Payer: Medicare Other

## 2017-02-04 NOTE — Telephone Encounter (Signed)
error 

## 2017-02-05 ENCOUNTER — Encounter (HOSPITAL_COMMUNITY): Payer: Medicare Other

## 2017-02-07 ENCOUNTER — Encounter (HOSPITAL_COMMUNITY)
Admission: RE | Admit: 2017-02-07 | Discharge: 2017-02-07 | Disposition: A | Discharge status: Home / Self Care | Payer: Medicare Other | Source: Ambulatory Visit | Attending: Cardiovascular Disease | Admitting: Cardiovascular Disease

## 2017-02-07 DIAGNOSIS — Z952 Presence of prosthetic heart valve: Secondary | ICD-10-CM | POA: Insufficient documentation

## 2017-02-10 ENCOUNTER — Other Ambulatory Visit: Payer: Self-pay | Admitting: Oncology

## 2017-02-10 ENCOUNTER — Encounter (HOSPITAL_COMMUNITY): Payer: Medicare Other

## 2017-02-10 DIAGNOSIS — C7A8 Other malignant neuroendocrine tumors: Secondary | ICD-10-CM

## 2017-02-11 ENCOUNTER — Encounter (HOSPITAL_COMMUNITY): Payer: Self-pay | Admitting: Radiology

## 2017-02-11 HISTORY — PX: IR RADIOLOGIST EVAL & MGMT: IMG5224

## 2017-02-12 ENCOUNTER — Encounter (HOSPITAL_COMMUNITY): Payer: Medicare Other

## 2017-02-14 ENCOUNTER — Encounter (HOSPITAL_COMMUNITY)
Admission: RE | Admit: 2017-02-14 | Discharge: 2017-02-14 | Disposition: A | Discharge status: Home / Self Care | Payer: Medicare Other | Source: Ambulatory Visit | Attending: Cardiovascular Disease | Admitting: Cardiovascular Disease

## 2017-02-14 ENCOUNTER — Telehealth: Payer: Self-pay | Admitting: *Deleted

## 2017-02-14 DIAGNOSIS — Z952 Presence of prosthetic heart valve: Secondary | ICD-10-CM

## 2017-02-14 DIAGNOSIS — D3A8 Other benign neuroendocrine tumors: Secondary | ICD-10-CM

## 2017-02-14 NOTE — Telephone Encounter (Signed)
Call from pt: he plans to be out of town 7/24. Requests to reschedule office visit to after Lu177 treatment. Appt canceled. Informed him schedulers will call with new appt.

## 2017-02-14 NOTE — Telephone Encounter (Signed)
Discussed plan with Dr. [name]. Pt needs labs prior to Lu177 treatment. Follow up with MD 2 weeks after tx with labs. Message to schedulers.

## 2017-02-15 ENCOUNTER — Telehealth: Payer: Self-pay | Admitting: Oncology

## 2017-02-15 NOTE — Telephone Encounter (Signed)
SW PT TO CONFIRM R/S LAB/MD APPT IN AUG PER [facility]

## 2017-02-17 ENCOUNTER — Encounter (HOSPITAL_COMMUNITY)
Admission: RE | Admit: 2017-02-17 | Discharge: 2017-02-17 | Disposition: A | Discharge status: Home / Self Care | Payer: Medicare Other | Source: Ambulatory Visit | Attending: Cardiovascular Disease | Admitting: Cardiovascular Disease

## 2017-02-17 ENCOUNTER — Other Ambulatory Visit: Payer: Self-pay | Admitting: Internal Medicine

## 2017-02-17 DIAGNOSIS — Z952 Presence of prosthetic heart valve: Secondary | ICD-10-CM

## 2017-02-18 NOTE — Progress Notes (Signed)
Cardiac Individual Treatment Plan  Patient Details  Name: Ronald Thompson MRN: [id number] Date of Birth: 06/18/1956 Referring Provider:     CARDIAC REHAB PHASE II ORIENTATION from 11/26/2016 in [facility]  Referring Provider  [name], MD.      Initial Encounter Date:    CARDIAC REHAB PHASE II ORIENTATION from 11/26/2016 in [facility]  Date  11/26/16  Referring Provider  [name], MD.      Visit Diagnosis: 04/16/16  S/P TAVR (transcatheter aortic valve replacement)  Patient's Home Medications on Admission:  Current Outpatient Prescriptions:  .  acetaminophen (TYLENOL) 500 MG tablet, Take 1,000 mg by mouth every 6 (six) hours as needed for headache., Disp: , Rfl:  .  aspirin EC 81 MG tablet, Take 81 mg by mouth every evening., Disp: , Rfl:  .  calcium carbonate (TUMS - DOSED IN MG ELEMENTAL CALCIUM) 500 MG chewable tablet, Chew 3 tablets by mouth 3 (three) times daily as needed for indigestion or heartburn. , Disp: , Rfl:  .  clindamycin (CLEOCIN) 300 MG capsule, Take 2 capsules by mouth one hour prior to dental appointment (Patient taking differently: Take 600 mg by mouth as needed (dental procedures). Take 2 capsules by mouth one hour prior to dental appointment), Disp: 6 capsule, Rfl: 2 .  docusate sodium (COLACE) 100 MG capsule, Take 100 mg by mouth daily as needed for mild constipation., Disp: , Rfl:  .  ibuprofen (ADVIL,MOTRIN) 200 MG tablet, Take 400 mg by mouth every 6 (six) hours as needed for headache or mild pain. , Disp: , Rfl:  .  losartan (COZAAR) 50 MG tablet, Take 1 tablet (50 mg total) by mouth daily., Disp: 90 tablet, Rfl: 3 .  metoprolol tartrate (LOPRESSOR) 25 MG tablet, Take 0.5 tablets (12.5 mg total) by mouth 2 (two) times daily., Disp: 60 tablet, Rfl: 11 .  Octreotide Acetate (SANDOSTATIN IJ), Inject 1 each as directed every 30 (thirty) days. , Disp: , Rfl:  .  oxyCODONE (OXY IR/ROXICODONE) 5  MG immediate release tablet, 1-2 tabs PO Q 6 hours PRN pain. (Patient not taking: Reported on 12/02/2016), Disp: 45 tablet, Rfl: 0 .  promethazine-codeine (PHENERGAN WITH CODEINE) 6.25-10 MG/5ML syrup, Take 5 mLs by mouth every 6 (six) hours as needed. (Patient not taking: Reported on 12/02/2016), Disp: 180 mL, Rfl: 0 .  simvastatin (ZOCOR) 20 MG tablet, TAKE ONE TABLET BY MOUTH DAILY AT 6 PM, Disp: 90 tablet, Rfl: 3 .  traMADol (ULTRAM) 50 MG tablet, Take 1 tablet (50 mg total) by mouth every 4 (four) hours as needed for moderate pain. (Patient not taking: Reported on 12/02/2016), Disp: 28 tablet, Rfl: 0  Past Medical History: Past Medical History:  Diagnosis Date  . Baker's cyst   . Gallstones   . Heart murmur   . Hypercholesteremia   . Hypertension   . Lesion of left lung    hx.25 yrs ago- testicilar cancer related- only scarring left after tx.-no problems now  . Liver metastasis ([place])   . 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  . Pericarditis    2nd to tumor  . Pneumonia 1990  . Primary neuroendocrine tumor of pancreas 02/14/2014   Stage IV with multiple mets to liver  . Pulmonary fibrosis ([place]) 11/08/2015  . S/P TAVR (transcatheter aortic valve replacement) 04/16/2016   26 mm Edwards Sapien 3 transcatheter  heart valve placed via percutaneous right transfemoral approach   . Shortness of breath dyspnea    with exertion and when tired  . 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 Transfusion history    hx. 25 yrs ago-during cancer tx.    Tobacco Use: History  Smoking Status  . Never Smoker  Smokeless Tobacco  . Never Used    Labs: Recent Review Flowsheet Data    Labs for ITP Cardiac and Pulmonary Rehab Latest Ref Rng & Units 04/16/2016 04/16/2016 04/16/2016 04/16/2016 01/08/2017   Cholestrol 0 - 200 mg/dL - - - - 131   LDLCALC  0 - 99 mg/dL - - - - 71   LDLDIRECT mg/dL - - - - -   HDL >39.00 mg/dL - - - - 48.80   Trlycerides 0.0 - 149.0 mg/dL - - - - 56.0   Hemoglobin A1c 4.6 - 6.5 % - - - - 6.2   PHART 7.350 - 7.450 7.367 - - 7.345(L) -   PCO2ART 32.0 - 48.0 mmHg 53.1(H) - - 54.9(H) -   HCO3 20.0 - 28.0 mmol/L 30.4(H) - - 30.0(H) -   TCO2 0 - 100 mmol/L 32 30 29 32 -   O2SAT % 100.0 - - 96.0 -      Capillary Blood Glucose: No results found for: GLUCAP   Exercise Target Goals:    Exercise Program Goal: Individual exercise prescription set with THRR, safety & activity barriers. Participant demonstrates ability to understand and report RPE using BORG scale, to self-measure pulse accurately, and to acknowledge the importance of the exercise prescription.  Exercise Prescription Goal: Starting with aerobic activity 30 plus minutes a day, 3 days per week for initial exercise prescription. Provide home exercise prescription and guidelines that participant acknowledges understanding prior to discharge.  Activity Barriers & Risk Stratification:     Activity Barriers & Cardiac Risk Stratification - 11/26/16 0855      Activity Barriers & Cardiac Risk Stratification   Activity Barriers None   Cardiac Risk Stratification High      6 Minute Walk:     6 Minute Walk    Row Name 11/26/16 1415         6 Minute Walk   Phase Initial     Distance 1843 feet     Walk Time 6 minutes     # of Rest Breaks 0     MPH 3.49     METS 4.41     RPE 11     VO2 Peak 15.43     Symptoms No     Resting HR 70 bpm     Resting BP 124/84     Max Ex. HR 104 bpm     Max Ex. BP 142/94     2 Minute Post BP 142/78        Oxygen Initial Assessment:   Oxygen Re-Evaluation:   Oxygen Discharge (Final Oxygen Re-Evaluation):   Initial Exercise Prescription:     Initial Exercise Prescription - 11/26/16 1400      Date of Initial Exercise RX and Referring Provider   Date 11/26/16   Referring Provider [name],  MD.     Treadmill   MPH 2.4   Grade 2   Minutes 10   METs 3.5     Bike   Level 1.5   Minutes 10   METs 4.33     NuStep   Level  4   SPM 90   Minutes 10   METs 3     Prescription Details   Frequency (times per week) 3   Duration Progress to 30 minutes of continuous aerobic without signs/symptoms of physical distress     Intensity   THRR 40-80% of Max Heartrate 63-126   Ratings of Perceived Exertion 11-13   Perceived Dyspnea 0-4     Progression   Progression Continue to progress workloads to maintain intensity without signs/symptoms of physical distress.     Resistance Training   Training Prescription Yes   Weight 5lb   Reps 10-15      Perform Capillary Blood Glucose checks as needed.  Exercise Prescription Changes:     Exercise Prescription Changes    Row Name 12/09/16 1600 01/13/17 1600 02/12/17 1600         Response to Exercise   Blood Pressure (Admit) 112/64 120/80 120/80     Blood Pressure (Exercise) 134/88 150/90 148/88     Blood Pressure (Exit) 126/82 128/82 122/84     Heart Rate (Admit) 66 bpm 79 bpm 68 bpm     Heart Rate (Exercise) 110 bpm 118 bpm 115 bpm     Heart Rate (Exit) 75 bpm 80 bpm 71 bpm     Rating of Perceived Exertion (Exercise) 11 12 11      Duration Progress to 45 minutes of aerobic exercise without signs/symptoms of physical distress Progress to 45 minutes of aerobic exercise without signs/symptoms of physical distress Progress to 45 minutes of aerobic exercise without signs/symptoms of physical distress     Intensity THRR unchanged THRR unchanged THRR unchanged       Progression   Progression Continue to progress workloads to maintain intensity without signs/symptoms of physical distress. Continue to progress workloads to maintain intensity without signs/symptoms of physical distress. Continue to progress workloads to maintain intensity without signs/symptoms of physical distress.     Average METs 2.8 4.1 4.2       Resistance  Training   Training Prescription Yes Yes Yes     Weight 5lb 5lb 8lbs     Reps 10-15 10-15 10-15       Treadmill   MPH 3 3 3.5     Grade 0 0 2     Minutes 10 10 10      METs 3.3 3.3 4.65       Bike   Level 1 1 1.5     Minutes 10 10 10      METs 3.7 3.7 4.37       NuStep   Level 1 1 5      SPM 90 90 90     Minutes 10 10 10      METs 3.1 3.1 3.6       Home Exercise Plan   Plans to continue exercise at  - Home (comment) Home (comment)     Frequency  - Add 3 additional days to program exercise sessions. Add 3 additional days to program exercise sessions.     Initial Home Exercises Provided  - 12/13/16 12/13/16        Exercise Comments:     Exercise Comments    Row Name 12/25/16 0749 01/20/17 0857 02/17/17 0805       Exercise Comments Reviewed METs and goals with pt.  Pt is doing well with exercise.  Reviewed METs and goals with pt.  Reviewed METs and goals with pt.         Exercise Goals and  Review:     Exercise Goals    Row Name 11/26/16 0850             Exercise Goals   Increase Physical Activity Yes       Intervention Provide advice, education, support and counseling about physical activity/exercise needs.;Develop an individualized exercise prescription for aerobic and resistive training based on initial evaluation findings, risk stratification, comorbidities and participant's personal goals.       Expected Outcomes Achievement of increased cardiorespiratory fitness and enhanced flexibility, muscular endurance and strength shown through measurements of functional capacity and personal statement of participant.       Increase Strength and Stamina Yes  Be able to climb stairs; improve overall cardiorespiratory fitness       Intervention Provide advice, education, support and counseling about physical activity/exercise needs.;Develop an individualized exercise prescription for aerobic and resistive training based on initial evaluation findings, risk stratification,  comorbidities and participant's personal goals.       Expected Outcomes Achievement of increased cardiorespiratory fitness and enhanced flexibility, muscular endurance and strength shown through measurements of functional capacity and personal statement of participant.          Exercise Goals Re-Evaluation :     Exercise Goals Re-Evaluation    Row Name 12/25/16 0747 01/20/17 0856 02/12/17 1647         Exercise Goal Re-Evaluation   Exercise Goals Review Increase Physical Activity;Increase Strenth and Stamina Increase Physical Activity;Increase Strenth and Stamina Increase Physical Activity;Increase Strenth and Stamina     Comments Pt states he feels more comfortable with aerobic exercise and he is tolerating workload increases well.  He states he does resistance training 3xs/week at home on his off days from CR and he walks 26min/day with his spouse. Pt is doing great with exercise and is tolerating workload increases well.  His BP has also improved with exercise and he states he is still doing RT at home on T, [place] and [place]. Pt is doing great with exercise and is tolerating workload increases well.  His BP has also improved with exercise and he states he is still doing RT at home on T, [place] and [place].     Expected Outcomes Continue with exercises Rx, HEP and increase workloads as tolerated in order to increase cardiorespiratory fitness level Continue with exercises Rx, HEP and increase workloads as tolerated in order to increase cardiorespiratory fitness level Continue with exercises Rx, HEP and increase workloads as tolerated in order to increase cardiorespiratory fitness level         Discharge Exercise Prescription (Final Exercise Prescription Changes):     Exercise Prescription Changes - 02/12/17 1600      Response to Exercise   Blood Pressure (Admit) 120/80   Blood Pressure (Exercise) 148/88   Blood Pressure (Exit) 122/84   Heart Rate (Admit) 68 bpm   Heart Rate (Exercise) 115 bpm    Heart Rate (Exit) 71 bpm   Rating of Perceived Exertion (Exercise) 11   Duration Progress to 45 minutes of aerobic exercise without signs/symptoms of physical distress   Intensity THRR unchanged     Progression   Progression Continue to progress workloads to maintain intensity without signs/symptoms of physical distress.   Average METs 4.2     Resistance Training   Training Prescription Yes   Weight 8lbs   Reps 10-15     Treadmill   MPH 3.5   Grade 2   Minutes 10   METs 4.65  Bike   Level 1.5   Minutes 10   METs 4.37     NuStep   Level 5   SPM 90   Minutes 10   METs 3.6     Home Exercise Plan   Plans to continue exercise at Home (comment)   Frequency Add 3 additional days to program exercise sessions.   Initial Home Exercises Provided 12/13/16      Nutrition:  Target Goals: Understanding of nutrition guidelines, daily intake of sodium 1500mg , cholesterol 200mg , calories 30% from fat and 7% or less from saturated fats, daily to have 5 or more servings of fruits and vegetables.  Biometrics:     Pre Biometrics - 11/26/16 1423      Pre Biometrics   Height 5' 9.5" (1.765 m)   Weight 187 lb 6.3 oz (85 kg)   Waist Circumference 36.75 inches   Hip Circumference 41 inches   Waist to Hip Ratio 0.9 %   BMI (Calculated) 27.3   Triceps Skinfold 15 mm   % Body Fat 25.5 %   Grip Strength 50 kg   Flexibility 7.5 in   Single Leg Stand 19.84 seconds       Nutrition Therapy Plan and Nutrition Goals:     Nutrition Therapy & Goals - 01/01/17 0810      Nutrition Therapy   Diet Therapeutic Lifestyle Changes     Personal Nutrition Goals   Nutrition Goal Pt to maintain his current wt while in Cardiac Rehab   Personal Goal #2 Pt to identify and limit food sources of saturated fat, trans fat, and sodium     Intervention Plan   Intervention Prescribe, educate and counsel regarding individualized specific dietary modifications aiming towards targeted core components  such as weight, hypertension, lipid management, diabetes, heart failure and other comorbidities.   Expected Outcomes Short Term Goal: Understand basic principles of dietary content, such as calories, fat, sodium, cholesterol and nutrients.;Long Term Goal: Adherence to prescribed nutrition plan.      Nutrition Discharge: Nutrition Scores:     Nutrition Assessments - 01/01/17 0810      MEDFICTS Scores   Pre Score 46      Nutrition Goals Re-Evaluation:   Nutrition Goals Re-Evaluation:   Nutrition Goals Discharge (Final Nutrition Goals Re-Evaluation):   Psychosocial: Target Goals: Acknowledge presence or absence of significant depression and/or stress, maximize coping skills, provide positive support system. Participant is able to verbalize types and ability to use techniques and skills needed for reducing stress and depression.  Initial Review & Psychosocial Screening:     Initial Psych Review & Screening - 11/26/16 1705      Initial Review   Current issues with None Identified     Family Dynamics   Good Support System? Yes     Barriers   Psychosocial barriers to participate in program There are no identifiable barriers or psychosocial needs.     Screening Interventions   Interventions Encouraged to exercise      Quality of Life Scores:     Quality of Life - 11/26/16 0955      Quality of Life Scores   Health/Function Pre 14.43 %   Socioeconomic Pre 24.43 %   Psych/Spiritual Pre 21.21 %   Family Pre 22.8 %   GLOBAL Pre 19.12 %      PHQ-9: Recent Review Flowsheet Data    Depression screen [facility] 2/9 01/08/2017 02/03/2014   Decreased Interest 0 0   Down, Depressed, Hopeless 0 0  PHQ - 2 Score 0 0   Altered sleeping 0 -   Tired, decreased energy 0 -   Change in appetite 0 -   Feeling bad or failure about yourself  0 -   Trouble concentrating 0 -   Moving slowly or fidgety/restless 0 -   Suicidal thoughts 0 -   PHQ-9 Score 0 -     Interpretation of Total  Score  Total Score Depression Severity:  1-4 = Minimal depression, 5-9 = Mild depression, 10-14 = Moderate depression, 15-19 = Moderately severe depression, 20-27 = Severe depression   Psychosocial Evaluation and Intervention:     Psychosocial Evaluation - 12/02/16 1115      Psychosocial Evaluation & Interventions   Interventions Encouraged to exercise with the program and follow exercise prescription   Comments no psychosocial needs identified, no interventions necessary    Expected Outcomes pt will demonstrate positive outlook with good coping skills.    Continue Psychosocial Services  No Follow up required      Psychosocial Re-Evaluation:     Psychosocial Re-Evaluation    [place] Name 12/24/16 1054 01/08/17 1028 02/12/17 1151 02/18/17 1518       Psychosocial Re-Evaluation   Current issues with None Identified None Identified None Identified None Identified    Comments pt displays eagerness to particiate in [facility] activities.   pt displays eagerness to particiate in [place] activities.   pt displays eagerness to particiate in [place] activities.  pt enjoys staying busy and living life to the fullest. He reports he has new perspective since his cancer diagnosis to savor each day.  pt displays eagerness to particiate in [place] activities.  pt enjoys staying busy and living life to the fullest. He reports he has new perspective since his cancer diagnosis to savor each day.     Expected Outcomes pt will exhibit positive outlook with good coping skills.  pt will exhibit positive outlook with good coping skills.  pt will exhibit positive outlook with good coping skills.  pt will exhibit positive outlook with good coping skills.     Interventions Encouraged to attend Cardiac Rehabilitation for the exercise Encouraged to attend Cardiac Rehabilitation for the exercise Encouraged to attend Cardiac Rehabilitation for the exercise Encouraged to attend Cardiac Rehabilitation for the exercise       Psychosocial  Discharge (Final Psychosocial Re-Evaluation):     Psychosocial Re-Evaluation - 02/18/17 1518      Psychosocial Re-Evaluation   Current issues with None Identified   Comments pt displays eagerness to particiate in [facility] activities.  pt enjoys staying busy and living life to the fullest. He reports he has new perspective since his cancer diagnosis to savor each day.    Expected Outcomes pt will exhibit positive outlook with good coping skills.    Interventions Encouraged to attend Cardiac Rehabilitation for the exercise      Vocational Rehabilitation: Provide vocational rehab assistance to qualifying candidates.   Vocational Rehab Evaluation & Intervention:     Vocational Rehab - 11/26/16 1704      Initial Vocational Rehab Evaluation & Intervention   Assessment shows need for Vocational Rehabilitation No      Education: Education Goals: Education classes will be provided on a weekly basis, covering required topics. Participant will state understanding/return demonstration of topics presented.  Learning Barriers/Preferences:     Learning Barriers/Preferences - 11/26/16 0849      Learning Barriers/Preferences   Learning Barriers Sight   Learning Preferences Written Material  Education Topics: Count Your Pulse:  -Group instruction provided by verbal instruction, demonstration, patient participation and written materials to support subject.  Instructors address importance of being able to find your pulse and how to count your pulse when at home without a heart monitor.  Patients get hands on experience counting their pulse with staff help and individually.   Heart Attack, Angina, and Risk Factor Modification:  -Group instruction provided by verbal instruction, video, and written materials to support subject.  Instructors address signs and symptoms of angina and heart attacks.    Also discuss risk factors for heart disease and how to make changes to improve heart health risk  factors.   Functional Fitness:  -Group instruction provided by verbal instruction, demonstration, patient participation, and written materials to support subject.  Instructors address safety measures for doing things around the house.  Discuss how to get up and down off the floor, how to pick things up properly, how to safely get out of a chair without assistance, and balance training.   Meditation and Mindfulness:  -Group instruction provided by verbal instruction, patient participation, and written materials to support subject.  Instructor addresses importance of mindfulness and meditation practice to help reduce stress and improve awareness.  Instructor also leads participants through a meditation exercise.    Stretching for Flexibility and Mobility:  -Group instruction provided by verbal instruction, patient participation, and written materials to support subject.  Instructors lead participants through series of stretches that are designed to increase flexibility thus improving mobility.  These stretches are additional exercise for major muscle groups that are typically performed during regular warm up and cool down.   Hands Only CPR:  -Group verbal, video, and participation provides a basic overview of AHA guidelines for community CPR. Role-play of emergencies allow participants the opportunity to practice calling for help and chest compression technique with discussion of AED use.   Hypertension: -Group verbal and written instruction that provides a basic overview of hypertension including the most recent diagnostic guidelines, risk factor reduction with self-care instructions and medication management.    Nutrition I class: Heart Healthy Eating:  -Group instruction provided by PowerPoint slides, verbal discussion, and written materials to support subject matter. The instructor gives an explanation and review of the Therapeutic Lifestyle Changes diet recommendations, which includes a  discussion on lipid goals, dietary fat, sodium, fiber, plant stanol/sterol esters, sugar, and the components of a well-balanced, healthy diet.   CARDIAC REHAB PHASE II EXERCISE from 01/01/2017 in [facility]  Date  01/01/17  Educator  RD  Instruction Review Code  Not applicable [class handouts given]      Nutrition II class: Lifestyle Skills:  -Group instruction provided by PowerPoint slides, verbal discussion, and written materials to support subject matter. The instructor gives an explanation and review of label reading, grocery shopping for heart health, heart healthy recipe modifications, and ways to make healthier choices when eating out.   CARDIAC REHAB PHASE II EXERCISE from 01/01/2017 in [facility]  Date  01/01/17  Educator  RD  Instruction Review Code  Not applicable [class handouts given]      Diabetes Question & Answer:  -Group instruction provided by PowerPoint slides, verbal discussion, and written materials to support subject matter. The instructor gives an explanation and review of diabetes co-morbidities, pre- and post-prandial blood glucose goals, pre-exercise blood glucose goals, signs, symptoms, and treatment of hypoglycemia and hyperglycemia, and foot care basics.   Diabetes Blitz:  -  Group instruction provided by PowerPoint slides, verbal discussion, and written materials to support subject matter. The instructor gives an explanation and review of the physiology behind type 1 and type 2 diabetes, diabetes medications and rational behind using different medications, pre- and post-prandial blood glucose recommendations and Hemoglobin A1c goals, diabetes diet, and exercise including blood glucose guidelines for exercising safely.    Portion Distortion:  -Group instruction provided by PowerPoint slides, verbal discussion, written materials, and food models to support subject matter. The instructor gives an  explanation of serving size versus portion size, changes in portions sizes over the last 20 years, and what consists of a serving from each food group.   Stress Management:  -Group instruction provided by verbal instruction, video, and written materials to support subject matter.  Instructors review role of stress in heart disease and how to cope with stress positively.     Exercising on Your Own:  -Group instruction provided by verbal instruction, power point, and written materials to support subject.  Instructors discuss benefits of exercise, components of exercise, frequency and intensity of exercise, and end points for exercise.  Also discuss use of nitroglycerin and activating EMS.  Review options of places to exercise outside of rehab.  Review guidelines for sex with heart disease.   Cardiac Drugs I:  -Group instruction provided by verbal instruction and written materials to support subject.  Instructor reviews cardiac drug classes: antiplatelets, anticoagulants, beta blockers, and statins.  Instructor discusses reasons, side effects, and lifestyle considerations for each drug class.   Cardiac Drugs II:  -Group instruction provided by verbal instruction and written materials to support subject.  Instructor reviews cardiac drug classes: angiotensin converting enzyme inhibitors (ACE-I), angiotensin II receptor blockers (ARBs), nitrates, and calcium channel blockers.  Instructor discusses reasons, side effects, and lifestyle considerations for each drug class.   Anatomy and Physiology of the Circulatory System:  Group verbal and written instruction and models provide basic cardiac anatomy and physiology, with the coronary electrical and arterial systems. Review of: AMI, Angina, Valve disease, Heart Failure, Peripheral Artery Disease, Cardiac Arrhythmia, Pacemakers, and the ICD.   Other Education:  -Group or individual verbal, written, or video instructions that support the educational  goals of the cardiac rehab program.   Knowledge Questionnaire Score:     Knowledge Questionnaire Score - 11/26/16 0955      Knowledge Questionnaire Score   Pre Score 20/24      Core Components/Risk Factors/Patient Goals at Admission:     Personal Goals and Risk Factors at Admission - 11/26/16 1407      Core Components/Risk Factors/Patient Goals on Admission   Hypertension Yes   Intervention Provide education on lifestyle modifcations including regular physical activity/exercise, weight management, moderate sodium restriction and increased consumption of fresh fruit, vegetables, and low fat dairy, alcohol moderation, and smoking cessation.;Monitor prescription use compliance.   Expected Outcomes Short Term: Continued assessment and intervention until BP is < 140/69mm HG in hypertensive participants. < 130/34mm HG in hypertensive participants with diabetes, heart failure or chronic kidney disease.;Long Term: Maintenance of blood pressure at goal levels.   Lipids Yes   Intervention Provide education and support for participant on nutrition & aerobic/resistive exercise along with prescribed medications to achieve LDL 70mg , HDL >40mg .   Expected Outcomes Short Term: Participant states understanding of desired cholesterol values and is compliant with medications prescribed. Participant is following exercise prescription and nutrition guidelines.;Long Term: Cholesterol controlled with medications as prescribed, with individualized exercise RX and with personalized nutrition  plan. Value goals: LDL < 70mg , HDL > 40 mg.   Personal Goal Other Yes   Personal Goal Increase stamina: be able to climb stairs. Improve cardiorespiratory fitness.   Intervention Develop an individual exercise prescription for aerobic and resistive training based on evaluation findings, risk stratification, comorbidiites and participant's personal goals.   Expected Outcomes Achieve increased cardiorespiratory fitness and  enhanced flexibilty, endurance and strength shown through measures of functional capacity and personal statements of participant.      Core Components/Risk Factors/Patient Goals Review:      Goals and Risk Factor Review    Row Name 12/24/16 1053 01/08/17 1027 02/12/17 1150 02/18/17 1518       Core Components/Risk Factors/Patient Goals Review   Personal Goals Review Hypertension;Lipids Hypertension;Lipids Hypertension;Lipids Hypertension;Lipids    Review pt demonstrates eagerness to participate in CR activities.  pt demonstrates eagerness to participate in CR activities. pt is exercising on his own at home doing yard work and weight lifting.  pt reports he has made dietary changes since meeting with Derek Mound, RD.  pt demonstrates eagerness to participate in CR activities. pt is exercising on his own at home doing yard work and weight lifting.  pt reports he is pleased his blood pressure values are improving.   pt demonstrates eagerness to participate in CR activities. pt is exercising on his own at home doing yard work and weight lifting.  BP values WNL at CR.      Expected Outcomes pt will participate in CR exercise, nutrition and lifestyle education opportunities to decrease overall CAD RF.   pt will participate in CR exercise, nutrition and lifestyle education opportunities to decrease overall CAD RF.   pt will participate in CR exercise, nutrition and lifestyle education opportunities to decrease overall CAD RF.   pt will participate in CR exercise, nutrition and lifestyle education opportunities to decrease overall CAD RF.         Core Components/Risk Factors/Patient Goals at Discharge (Final Review):      Goals and Risk Factor Review - 02/18/17 1518      Core Components/Risk Factors/Patient Goals Review   Personal Goals Review Hypertension;Lipids   Review pt demonstrates eagerness to participate in CR activities. pt is exercising on his own at home doing yard work and weight lifting.   BP values WNL at CR.     Expected Outcomes pt will participate in CR exercise, nutrition and lifestyle education opportunities to decrease overall CAD RF.        ITP Comments:     ITP Comments    Row Name 11/26/16 0846 01/21/17 0720 02/18/17 1517       ITP Comments Medical Director, Dr. Fransico Him Medical Director, Dr. Fransico Him Medical Director, Dr. Fransico Him        Comments: Pt is making expected progress toward personal goals after completing 25 sessions. Recommend continued exercise and life style modification education including  stress management and relaxation techniques to decrease cardiac risk profile.

## 2017-02-19 ENCOUNTER — Encounter (HOSPITAL_COMMUNITY)
Admission: RE | Admit: 2017-02-19 | Discharge: 2017-02-19 | Disposition: A | Discharge status: Home / Self Care | Payer: Medicare Other | Source: Ambulatory Visit | Attending: Cardiovascular Disease | Admitting: Cardiovascular Disease

## 2017-02-19 DIAGNOSIS — Z952 Presence of prosthetic heart valve: Secondary | ICD-10-CM | POA: Diagnosis not present

## 2017-02-21 ENCOUNTER — Encounter (HOSPITAL_COMMUNITY)
Admission: RE | Admit: 2017-02-21 | Discharge: 2017-02-21 | Disposition: A | Discharge status: Home / Self Care | Payer: Medicare Other | Source: Ambulatory Visit | Attending: Cardiovascular Disease | Admitting: Cardiovascular Disease

## 2017-02-21 DIAGNOSIS — Z952 Presence of prosthetic heart valve: Secondary | ICD-10-CM | POA: Diagnosis not present

## 2017-02-24 ENCOUNTER — Encounter (HOSPITAL_COMMUNITY)
Admission: RE | Admit: 2017-02-24 | Discharge: 2017-02-24 | Disposition: A | Discharge status: Home / Self Care | Payer: Medicare Other | Source: Ambulatory Visit | Attending: Cardiovascular Disease | Admitting: Cardiovascular Disease

## 2017-02-24 VITALS — Wt 182.1 lb

## 2017-02-24 DIAGNOSIS — Z952 Presence of prosthetic heart valve: Secondary | ICD-10-CM

## 2017-02-25 ENCOUNTER — Ambulatory Visit: Payer: Medicare Other

## 2017-02-25 ENCOUNTER — Ambulatory Visit: Payer: Medicare Other | Admitting: Oncology

## 2017-02-26 ENCOUNTER — Encounter (HOSPITAL_COMMUNITY): Payer: Medicare Other

## 2017-02-28 ENCOUNTER — Encounter (HOSPITAL_COMMUNITY): Payer: Medicare Other

## 2017-03-03 ENCOUNTER — Encounter (HOSPITAL_COMMUNITY)
Admission: RE | Admit: 2017-03-03 | Discharge: 2017-03-03 | Disposition: A | Discharge status: Home / Self Care | Payer: Medicare Other | Source: Ambulatory Visit | Attending: Cardiovascular Disease | Admitting: Cardiovascular Disease

## 2017-03-03 ENCOUNTER — Other Ambulatory Visit (HOSPITAL_BASED_OUTPATIENT_CLINIC_OR_DEPARTMENT_OTHER): Payer: Medicare Other

## 2017-03-03 DIAGNOSIS — D3A8 Other benign neuroendocrine tumors: Secondary | ICD-10-CM

## 2017-03-03 DIAGNOSIS — C7A8 Other malignant neuroendocrine tumors: Secondary | ICD-10-CM | POA: Diagnosis present

## 2017-03-03 DIAGNOSIS — Z952 Presence of prosthetic heart valve: Secondary | ICD-10-CM

## 2017-03-03 DIAGNOSIS — C7B8 Other secondary neuroendocrine tumors: Secondary | ICD-10-CM | POA: Diagnosis not present

## 2017-03-03 LAB — COMPREHENSIVE METABOLIC PANEL
ALBUMIN: 4.1 g/dL (ref 3.5–5.0)
ALK PHOS: 64 U/L (ref 40–150)
ALT: 24 U/L (ref 0–55)
ANION GAP: 5 meq/L (ref 3–11)
AST: 29 U/L (ref 5–34)
BILIRUBIN TOTAL: 1.08 mg/dL (ref 0.20–1.20)
BUN: 18.4 mg/dL (ref 7.0–26.0)
CHLORIDE: 102 meq/L (ref 98–109)
CO2: 32 mEq/L — ABNORMAL HIGH (ref 22–29)
CREATININE: 0.9 mg/dL (ref 0.7–1.3)
Calcium: 9.4 mg/dL (ref 8.4–10.4)
EGFR: 86 mL/min/{1.73_m2} — ABNORMAL LOW (ref >90)
Glucose: 94 mg/dl (ref 70–140)
Potassium: 4.7 mEq/L (ref 3.5–5.1)
Sodium: 139 mEq/L (ref 136–145)
TOTAL PROTEIN: 6.5 g/dL (ref 6.4–8.3)

## 2017-03-03 LAB — CBC WITH DIFFERENTIAL/PLATELET
BASO%: 0.2 % (ref 0.0–2.0)
BASOS ABS: 0 10*3/uL (ref 0.0–0.1)
EOS%: 1 % (ref 0.0–7.0)
Eosinophils Absolute: 0.1 10*3/uL (ref 0.0–0.5)
HEMATOCRIT: 44.2 % (ref 38.4–49.9)
HGB: 15.2 g/dL (ref 13.0–17.1)
LYMPH#: 0.9 10*3/uL (ref 0.9–3.3)
LYMPH%: 16.3 % (ref 14.0–49.0)
MCH: 32.2 pg (ref 27.2–33.4)
MCHC: 34.4 g/dL (ref 32.0–36.0)
MCV: 93.6 fL (ref 79.3–98.0)
MONO#: 0.5 10*3/uL (ref 0.1–0.9)
MONO%: 9.3 % (ref 0.0–14.0)
NEUT#: 4.2 10*3/uL (ref 1.5–6.5)
NEUT%: 73.2 % (ref 39.0–75.0)
Platelets: 127 10*3/uL — ABNORMAL LOW (ref 140–400)
RBC: 4.72 10*6/uL (ref 4.20–5.82)
RDW: 12.2 % (ref 11.0–14.6)
WBC: 5.8 10*3/uL (ref 4.0–10.3)

## 2017-03-05 ENCOUNTER — Other Ambulatory Visit: Payer: Self-pay | Admitting: Oncology

## 2017-03-05 ENCOUNTER — Ambulatory Visit (HOSPITAL_BASED_OUTPATIENT_CLINIC_OR_DEPARTMENT_OTHER): Payer: Medicare Other

## 2017-03-05 ENCOUNTER — Encounter (HOSPITAL_COMMUNITY): Payer: Medicare Other

## 2017-03-05 ENCOUNTER — Other Ambulatory Visit: Payer: Medicare Other

## 2017-03-05 ENCOUNTER — Ambulatory Visit (HOSPITAL_COMMUNITY)
Admission: RE | Admit: 2017-03-05 | Discharge: 2017-03-05 | Disposition: A | Discharge status: Home / Self Care | Payer: Medicare Other | Source: Ambulatory Visit | Attending: Diagnostic Radiology | Admitting: Diagnostic Radiology

## 2017-03-05 ENCOUNTER — Encounter (HOSPITAL_COMMUNITY): Payer: Self-pay

## 2017-03-05 VITALS — BP 137/70 | HR 78 | Temp 98.0°F | Resp 20

## 2017-03-05 DIAGNOSIS — C7A8 Other malignant neuroendocrine tumors: Secondary | ICD-10-CM

## 2017-03-05 DIAGNOSIS — D3A8 Other benign neuroendocrine tumors: Secondary | ICD-10-CM

## 2017-03-05 DIAGNOSIS — C7B8 Other secondary neuroendocrine tumors: Secondary | ICD-10-CM

## 2017-03-05 DIAGNOSIS — C7951 Secondary malignant neoplasm of bone: Secondary | ICD-10-CM | POA: Diagnosis not present

## 2017-03-05 DIAGNOSIS — C787 Secondary malignant neoplasm of liver and intrahepatic bile duct: Secondary | ICD-10-CM | POA: Diagnosis not present

## 2017-03-05 MED ORDER — AMINO ACID RADIOPROTECTANT - L-LYSINE 2.5%/L-ARGININE 2.5% IN NS
250.0000 mL/h | INTRAVENOUS | Status: AC
  Administered 2017-03-05: 250 mL/h via INTRAVENOUS
  Filled 2017-03-05: qty 1000

## 2017-03-05 MED ORDER — SODIUM CHLORIDE 0.9 % IV SOLN
8.0000 mg | Freq: Once | INTRAVENOUS | Status: AC
  Administered 2017-03-05: 8 mg via INTRAVENOUS
  Filled 2017-03-05: qty 4

## 2017-03-05 MED ORDER — LUTETIUM LU 177 DOTATATE 370 MBQ/ML IV SOLN
200.0000 | Freq: Once | INTRAVENOUS | Status: AC
  Administered 2017-03-05: 191.2 via INTRAVENOUS

## 2017-03-05 MED ORDER — OCTREOTIDE ACETATE 30 MG IM KIT
30.0000 mg | PACK | Freq: Once | INTRAMUSCULAR | Status: AC
  Administered 2017-03-05: 30 mg via INTRAMUSCULAR
  Filled 2017-03-05: qty 1

## 2017-03-05 MED ORDER — ONDANSETRON HCL 8 MG PO TABS: 8.0000 mg | ORAL_TABLET | Freq: Two times a day (BID) | ORAL | 0 refills | Status: DC | PRN

## 2017-03-05 MED ORDER — PROCHLORPERAZINE EDISYLATE 5 MG/ML IJ SOLN
10.0000 mg | INTRAMUSCULAR | Status: DC | PRN
  Filled 2017-03-05: qty 2

## 2017-03-05 NOTE — Progress Notes (Signed)
Patient admitted to Nuclear Med unit for Lutatthera treatment. Patient A&O x 4, VSS (see flowsheets). IV's placed and patient resting comfortably. Will continue to monitor.  

## 2017-03-05 NOTE — Patient Instructions (Signed)
Influenza Virus Vaccine (Flucelvax) What is this medicine? INFLUENZA VIRUS VACCINE (in floo EN zuh VAHY ruhs vak SEEN) helps to reduce the risk of getting influenza also known as the flu. The vaccine only helps protect you against some strains of the flu. This medicine may be used for other purposes; ask your health care provider or pharmacist if you have questions. What should I tell my health care provider before I take this medicine? They need to know if you have any of these conditions: -bleeding disorder like hemophilia -fever or infection -Guillain-Barre syndrome or other neurological problems -immune system problems -infection with the human immunodeficiency virus (HIV) or AIDS -low blood platelet counts -multiple sclerosis -an unusual or allergic reaction to influenza virus vaccine, other medicines, foods, dyes or preservatives -pregnant or trying to get pregnant -breast-feeding How should I use this medicine? This vaccine is for injection into a muscle. It is given by a health care professional. A copy of Vaccine Information Statements will be given before each vaccination. Read this sheet carefully each time. The sheet may change frequently. Talk to your pediatrician regarding the use of this medicine in children. Special care may be needed. Overdosage: If you think you've taken too much of this medicine contact a poison control center or emergency room at once. Overdosage: If you think you have taken too much of this medicine contact a poison control center or emergency room at once. NOTE: This medicine is only for you. Do not share this medicine with others. What if I miss a dose? This does not apply. What may interact with this medicine? -chemotherapy or radiation therapy -medicines that lower your immune system like etanercept, anakinra, infliximab, and adalimumab -medicines that treat or prevent blood clots like warfarin -phenytoin -steroid medicines like prednisone or  cortisone -theophylline -vaccines This list may not describe all possible interactions. Give your health care provider a list of all the medicines, herbs, non-prescription drugs, or dietary supplements you use. Also tell them if you smoke, drink alcohol, or use illegal drugs. Some items may interact with your medicine. What should I watch for while using this medicine? Report any side effects that do not go away within 3 days to your doctor or health care professional. Call your health care provider if any unusual symptoms occur within 6 weeks of receiving this vaccine. You may still catch the flu, but the illness is not usually as bad. You cannot get the flu from the vaccine. The vaccine will not protect against colds or other illnesses that may cause fever. The vaccine is needed every year. What side effects may I notice from receiving this medicine? Side effects that you should report to your doctor or health care professional as soon as possible: -allergic reactions like skin rash, itching or hives, swelling of the face, lips, or tongue Side effects that usually do not require medical attention (Report these to your doctor or health care professional if they continue or are bothersome.): -fever -headache -muscle aches and pains -pain, tenderness, redness, or swelling at the injection site -tiredness This list may not describe all possible side effects. Call your doctor for medical advice about side effects. You may report side effects to FDA at 1-800-FDA-1088. Where should I keep my medicine? The vaccine will be given by a health care professional in a clinic, pharmacy, doctor's office, or other health care setting. You will not be given vaccine doses to store at home. NOTE: This sheet is a summary. It may not cover   all possible information. If you have questions about this medicine, talk to your doctor, pharmacist, or health care provider.    2016, Elsevier/Gold Standard. (2011-07-03  14:06:47) Octreotide injection solution What is this medicine? OCTREOTIDE (ok TREE oh tide) is used to reduce blood levels of growth hormone in patients with a condition called acromegaly. This medicine also reduces flushing and watery diarrhea caused by certain types of cancer. This medicine may be used for other purposes; ask your health care provider or pharmacist if you have questions. What should I tell my health care provider before I take this medicine? They need to know if you have any of these conditions: -gallbladder disease -kidney disease -liver disease -an unusual or allergic reaction to octreotide, other medicines, foods, dyes, or preservatives -pregnant or trying to get pregnant -breast-feeding How should I use this medicine? This medicine is for injection under the skin or into a vein (only in emergency situations). It is usually given by a health care professional in a hospital or clinic setting. If you get this medicine at home, you will be taught how to prepare and give this medicine. Allow the injection solution to come to room temperature before use. Do not warm it artificially. Use exactly as directed. Take your medicine at regular intervals. Do not take your medicine more often than directed. It is important that you put your used needles and syringes in a special sharps container. Do not put them in a trash can. If you do not have a sharps container, call your pharmacist or healthcare provider to get one. Talk to your pediatrician regarding the use of this medicine in children. Special care may be needed. Overdosage: If you think you have taken too much of this medicine contact a poison control center or emergency room at once. NOTE: This medicine is only for you. Do not share this medicine with others. What if I miss a dose? If you miss a dose, take it as soon as you can. If it is almost time for your next dose, take only that dose. Do not take double or extra  doses. What may interact with this medicine? Do not take this medicine with any of the following medications: -cisapride -droperidol -general anesthetics -grepafloxacin -perphenazine -thioridazine This medicine may also interact with the following medications: -bromocriptine -cyclosporine -diuretics -medicines for blood pressure, heart disease, irregular heart beat -medicines for diabetes, including insulin -quinidine This list may not describe all possible interactions. Give your health care provider a list of all the medicines, herbs, non-prescription drugs, or dietary supplements you use. Also tell them if you smoke, drink alcohol, or use illegal drugs. Some items may interact with your medicine. What should I watch for while using this medicine? Visit your doctor or health care professional for regular checks on your progress. To help reduce irritation at the injection site, use a different site for each injection and make sure the solution is at room temperature before use. This medicine may cause increases or decreases in blood sugar. Signs of high blood sugar include frequent urination, unusual thirst, flushed or dry skin, difficulty breathing, drowsiness, stomach ache, nausea, vomiting or dry mouth. Signs of low blood sugar include chills, cool, pale skin or cold sweats, drowsiness, extreme hunger, fast heartbeat, headache, nausea, nervousness or anxiety, shakiness, trembling, unsteadiness, tiredness, or weakness. Contact your doctor or health care professional right away if you experience any of these symptoms. What side effects may I notice from receiving this medicine? Side effects that you  should report to your doctor or health care professional as soon as possible: -allergic reactions like skin rash, itching or hives, swelling of the face, lips, or tongue -changes in blood sugar -changes in heart rate -severe stomach pain Side effects that usually do not require medical  attention (report to your doctor or health care professional if they continue or are bothersome): -diarrhea or constipation -gas or stomach pain -nausea, vomiting -pain, redness, swelling and irritation at site where injected This list may not describe all possible side effects. Call your doctor for medical advice about side effects. You may report side effects to FDA at 1-800-FDA-1088. Where should I keep my medicine? Keep out of the reach of children. Store in a refrigerator between 2 and 8 degrees C (36 and 46 degrees F). Protect from light. Allow to come to room temperature naturally. Do not use artificial heat. If protected from light, the injection may be stored at room temperature between 20 and 30 degrees C (70 and 86 degrees F) for 14 days. After the initial use, throw away any unused portion of a multiple dose vial after 14 days. Throw away unused portions of the ampules after use. NOTE: This sheet is a summary. It may not cover all possible information. If you have questions about this medicine, talk to your doctor, pharmacist, or health care provider.    2016, Elsevier/Gold Standard. (2008-02-16 16:56:04) Octreotide injection solution What is this medicine? OCTREOTIDE (ok TREE oh tide) is used to reduce blood levels of growth hormone in patients with a condition called acromegaly. This medicine also reduces flushing and watery diarrhea caused by certain types of cancer. This medicine may be used for other purposes; ask your health care provider or pharmacist if you have questions. What should I tell my health care provider before I take this medicine? They need to know if you have any of these conditions: -gallbladder disease -kidney disease -liver disease -an unusual or allergic reaction to octreotide, other medicines, foods, dyes, or preservatives -pregnant or trying to get pregnant -breast-feeding How should I use this medicine? This medicine is for injection under the skin  or into a vein (only in emergency situations). It is usually given by a health care professional in a hospital or clinic setting. If you get this medicine at home, you will be taught how to prepare and give this medicine. Allow the injection solution to come to room temperature before use. Do not warm it artificially. Use exactly as directed. Take your medicine at regular intervals. Do not take your medicine more often than directed. It is important that you put your used needles and syringes in a special sharps container. Do not put them in a trash can. If you do not have a sharps container, call your pharmacist or healthcare provider to get one. Talk to your pediatrician regarding the use of this medicine in children. Special care may be needed. Overdosage: If you think you have taken too much of this medicine contact a poison control center or emergency room at once. NOTE: This medicine is only for you. Do not share this medicine with others. What if I miss a dose? If you miss a dose, take it as soon as you can. If it is almost time for your next dose, take only that dose. Do not take double or extra doses. What may interact with this medicine? Do not take this medicine with any of the following medications: -cisapride -droperidol -general anesthetics -grepafloxacin -perphenazine -thioridazine This medicine  may also interact with the following medications: -bromocriptine -cyclosporine -diuretics -medicines for blood pressure, heart disease, irregular heart beat -medicines for diabetes, including insulin -quinidine This list may not describe all possible interactions. Give your health care provider a list of all the medicines, herbs, non-prescription drugs, or dietary supplements you use. Also tell them if you smoke, drink alcohol, or use illegal drugs. Some items may interact with your medicine. What should I watch for while using this medicine? Visit your doctor or health care  professional for regular checks on your progress. To help reduce irritation at the injection site, use a different site for each injection and make sure the solution is at room temperature before use. This medicine may cause increases or decreases in blood sugar. Signs of high blood sugar include frequent urination, unusual thirst, flushed or dry skin, difficulty breathing, drowsiness, stomach ache, nausea, vomiting or dry mouth. Signs of low blood sugar include chills, cool, pale skin or cold sweats, drowsiness, extreme hunger, fast heartbeat, headache, nausea, nervousness or anxiety, shakiness, trembling, unsteadiness, tiredness, or weakness. Contact your doctor or health care professional right away if you experience any of these symptoms. What side effects may I notice from receiving this medicine? Side effects that you should report to your doctor or health care professional as soon as possible: -allergic reactions like skin rash, itching or hives, swelling of the face, lips, or tongue -changes in blood sugar -changes in heart rate -severe stomach pain Side effects that usually do not require medical attention (report to your doctor or health care professional if they continue or are bothersome): -diarrhea or constipation -gas or stomach pain -nausea, vomiting -pain, redness, swelling and irritation at site where injected This list may not describe all possible side effects. Call your doctor for medical advice about side effects. You may report side effects to FDA at 1-800-FDA-1088. Where should I keep my medicine? Keep out of the reach of children. Store in a refrigerator between 2 and 8 degrees C (36 and 46 degrees F). Protect from light. Allow to come to room temperature naturally. Do not use artificial heat. If protected from light, the injection may be stored at room temperature between 20 and 30 degrees C (70 and 86 degrees F) for 14 days. After the initial use, throw away any unused  portion of a multiple dose vial after 14 days. Throw away unused portions of the ampules after use. NOTE: This sheet is a summary. It may not cover all possible information. If you have questions about this medicine, talk to your doctor, pharmacist, or health care provider.    2016, Elsevier/Gold Standard. (2008-02-16 16:56:04) Octreotide injection solution What is this medicine? OCTREOTIDE (ok TREE oh tide) is used to reduce blood levels of growth hormone in patients with a condition called acromegaly. This medicine also reduces flushing and watery diarrhea caused by certain types of cancer. This medicine may be used for other purposes; ask your health care provider or pharmacist if you have questions. COMMON BRAND NAME(S): Sandostatin, Sandostatin LAR What should I tell my health care provider before I take this medicine? They need to know if you have any of these conditions: -gallbladder disease -kidney disease -liver disease -an unusual or allergic reaction to octreotide, other medicines, foods, dyes, or preservatives -pregnant or trying to get pregnant -breast-feeding How should I use this medicine? This medicine is for injection under the skin or into a vein (only in emergency situations). It is usually given by a health care  professional in a hospital or clinic setting. If you get this medicine at home, you will be taught how to prepare and give this medicine. Allow the injection solution to come to room temperature before use. Do not warm it artificially. Use exactly as directed. Take your medicine at regular intervals. Do not take your medicine more often than directed. It is important that you put your used needles and syringes in a special sharps container. Do not put them in a trash can. If you do not have a sharps container, call your pharmacist or healthcare provider to get one. Talk to your pediatrician regarding the use of this medicine in children. Special care may be  needed. Overdosage: If you think you have taken too much of this medicine contact a poison control center or emergency room at once. NOTE: This medicine is only for you. Do not share this medicine with others. What if I miss a dose? If you miss a dose, take it as soon as you can. If it is almost time for your next dose, take only that dose. Do not take double or extra doses. What may interact with this medicine? Do not take this medicine with any of the following medications: -cisapride -droperidol -general anesthetics -grepafloxacin -perphenazine -thioridazine This medicine may also interact with the following medications: -bromocriptine -cyclosporine -diuretics -medicines for blood pressure, heart disease, irregular heart beat -medicines for diabetes, including insulin -quinidine This list may not describe all possible interactions. Give your health care provider a list of all the medicines, herbs, non-prescription drugs, or dietary supplements you use. Also tell them if you smoke, drink alcohol, or use illegal drugs. Some items may interact with your medicine. What should I watch for while using this medicine? Visit your doctor or health care professional for regular checks on your progress. To help reduce irritation at the injection site, use a different site for each injection and make sure the solution is at room temperature before use. This medicine may cause increases or decreases in blood sugar. Signs of high blood sugar include frequent urination, unusual thirst, flushed or dry skin, difficulty breathing, drowsiness, stomach ache, nausea, vomiting or dry mouth. Signs of low blood sugar include chills, cool, pale skin or cold sweats, drowsiness, extreme hunger, fast heartbeat, headache, nausea, nervousness or anxiety, shakiness, trembling, unsteadiness, tiredness, or weakness. Contact your doctor or health care professional right away if you experience any of these symptoms. What  side effects may I notice from receiving this medicine? Side effects that you should report to your doctor or health care professional as soon as possible: -allergic reactions like skin rash, itching or hives, swelling of the face, lips, or tongue -changes in blood sugar -changes in heart rate -severe stomach pain Side effects that usually do not require medical attention (report to your doctor or health care professional if they continue or are bothersome): -diarrhea or constipation -gas or stomach pain -nausea, vomiting -pain, redness, swelling and irritation at site where injected This list may not describe all possible side effects. Call your doctor for medical advice about side effects. You may report side effects to FDA at 1-800-FDA-1088. Where should I keep my medicine? Keep out of the reach of children. Store in a refrigerator between 2 and 8 degrees C (36 and 46 degrees F). Protect from light. Allow to come to room temperature naturally. Do not use artificial heat. If protected from light, the injection may be stored at room temperature between 20 and 30 degrees C (  70 and 86 degrees F) for 14 days. After the initial use, throw away any unused portion of a multiple dose vial after 14 days. Throw away unused portions of the ampules after use. NOTE: This sheet is a summary. It may not cover all possible information. If you have questions about this medicine, talk to your doctor, pharmacist, or health care provider.  2018 Elsevier/Gold Standard (2008-02-16 16:56:04)

## 2017-03-05 NOTE — Progress Notes (Signed)
Patient finished with treatment with no complications.ASS stable the entire treatment.  IV's removed and prescription for Zofran given to patient along with discharge instructions.

## 2017-03-07 ENCOUNTER — Encounter (HOSPITAL_COMMUNITY): Payer: Medicare Other

## 2017-03-10 ENCOUNTER — Encounter (HOSPITAL_COMMUNITY)
Admission: RE | Admit: 2017-03-10 | Discharge: 2017-03-10 | Disposition: A | Discharge status: Home / Self Care | Payer: Medicare Other | Source: Ambulatory Visit | Attending: Cardiovascular Disease | Admitting: Cardiovascular Disease

## 2017-03-10 DIAGNOSIS — Z952 Presence of prosthetic heart valve: Secondary | ICD-10-CM | POA: Insufficient documentation

## 2017-03-12 ENCOUNTER — Encounter (HOSPITAL_COMMUNITY)
Admission: RE | Admit: 2017-03-12 | Discharge: 2017-03-12 | Disposition: A | Discharge status: Home / Self Care | Payer: Medicare Other | Source: Ambulatory Visit | Attending: Cardiovascular Disease | Admitting: Cardiovascular Disease

## 2017-03-12 DIAGNOSIS — Z952 Presence of prosthetic heart valve: Secondary | ICD-10-CM | POA: Diagnosis not present

## 2017-03-14 ENCOUNTER — Encounter (HOSPITAL_COMMUNITY)
Admission: RE | Admit: 2017-03-14 | Discharge: 2017-03-14 | Disposition: A | Discharge status: Home / Self Care | Payer: Medicare Other | Source: Ambulatory Visit | Attending: Cardiovascular Disease | Admitting: Cardiovascular Disease

## 2017-03-14 DIAGNOSIS — Z952 Presence of prosthetic heart valve: Secondary | ICD-10-CM

## 2017-03-17 ENCOUNTER — Encounter (HOSPITAL_COMMUNITY): Payer: Medicare Other

## 2017-03-18 ENCOUNTER — Other Ambulatory Visit (HOSPITAL_BASED_OUTPATIENT_CLINIC_OR_DEPARTMENT_OTHER): Payer: Medicare Other

## 2017-03-18 ENCOUNTER — Ambulatory Visit (HOSPITAL_BASED_OUTPATIENT_CLINIC_OR_DEPARTMENT_OTHER): Payer: Medicare Other | Admitting: Oncology

## 2017-03-18 ENCOUNTER — Telehealth: Payer: Self-pay | Admitting: Oncology

## 2017-03-18 VITALS — BP 142/73 | HR 72 | Temp 98.4°F | Resp 18 | Ht 70.0 in | Wt 180.8 lb

## 2017-03-18 DIAGNOSIS — C7A8 Other malignant neuroendocrine tumors: Secondary | ICD-10-CM

## 2017-03-18 DIAGNOSIS — R5381 Other malaise: Secondary | ICD-10-CM | POA: Diagnosis not present

## 2017-03-18 DIAGNOSIS — D3A8 Other benign neuroendocrine tumors: Secondary | ICD-10-CM

## 2017-03-18 DIAGNOSIS — C7B8 Other secondary neuroendocrine tumors: Secondary | ICD-10-CM | POA: Diagnosis not present

## 2017-03-18 DIAGNOSIS — K5900 Constipation, unspecified: Secondary | ICD-10-CM

## 2017-03-18 LAB — CBC WITH DIFFERENTIAL/PLATELET
BASO%: 0.6 % (ref 0.0–2.0)
Basophils Absolute: 0 10*3/uL (ref 0.0–0.1)
EOS%: 1.4 % (ref 0.0–7.0)
Eosinophils Absolute: 0.1 10*3/uL (ref 0.0–0.5)
HEMATOCRIT: 47.4 % (ref 38.4–49.9)
HGB: 15.9 g/dL (ref 13.0–17.1)
LYMPH%: 6.4 % — AB (ref 14.0–49.0)
MCH: 31.9 pg (ref 27.2–33.4)
MCHC: 33.6 g/dL (ref 32.0–36.0)
MCV: 95.1 fL (ref 79.3–98.0)
MONO#: 0.7 10*3/uL (ref 0.1–0.9)
MONO%: 11.3 % (ref 0.0–14.0)
NEUT%: 80.3 % — ABNORMAL HIGH (ref 39.0–75.0)
NEUTROS ABS: 4.6 10*3/uL (ref 1.5–6.5)
Platelets: 134 10*3/uL — ABNORMAL LOW (ref 140–400)
RBC: 4.98 10*6/uL (ref 4.20–5.82)
RDW: 12.9 % (ref 11.0–14.6)
WBC: 5.8 10*3/uL (ref 4.0–10.3)
lymph#: 0.4 10*3/uL — ABNORMAL LOW (ref 0.9–3.3)

## 2017-03-18 LAB — COMPREHENSIVE METABOLIC PANEL
ALBUMIN: 4 g/dL (ref 3.5–5.0)
ALK PHOS: 63 U/L (ref 40–150)
ALT: 25 U/L (ref 0–55)
AST: 28 U/L (ref 5–34)
Anion Gap: 8 mEq/L (ref 3–11)
BILIRUBIN TOTAL: 1.56 mg/dL — AB (ref 0.20–1.20)
BUN: 18.4 mg/dL (ref 7.0–26.0)
CALCIUM: 9.3 mg/dL (ref 8.4–10.4)
CO2: 27 mEq/L (ref 22–29)
CREATININE: 0.9 mg/dL (ref 0.7–1.3)
Chloride: 104 mEq/L (ref 98–109)
EGFR: 86 mL/min/{1.73_m2} — ABNORMAL LOW (ref >90)
GLUCOSE: 106 mg/dL (ref 70–140)
Potassium: 4.4 mEq/L (ref 3.5–5.1)
Sodium: 139 mEq/L (ref 136–145)
TOTAL PROTEIN: 6.6 g/dL (ref 6.4–8.3)

## 2017-03-18 NOTE — Telephone Encounter (Signed)
Gave patient avs and calendar for upcoming appointments.  °

## 2017-03-18 NOTE — Progress Notes (Signed)
[facility] OFFICE PROGRESS NOTE   Diagnosis: Pancreatic neuroendocrine tumor  INTERVAL HISTORY:   Ronald Thompson returns as scheduled. He was treated with Lutathera on 03/05/2017. He did not have significant nausea on the day of treatment. He received Sandostatin 03/05/2017. He complains of constipation that improved over the past few days. He also noticed increased malaise following the Lutathera. He continues to exercise and attends cardiac physical therapy.   Objective:  Vital signs in last 24 hours:  Blood pressure (!) 142/73, pulse 72, temperature 98.4 F (36.9 C), temperature source Oral, resp. rate 18, height 5\' 10"  (1.778 m), weight 180 lb 12.8 oz (82 kg), SpO2 97 %.    HEENT: No thrush or ulcers Resp: Decreased breath sounds over the left chest, no respiratory distress Cardio: Regular rate and rhythm, 2/6 diastolic murmur GI: No hepatosplenomegaly, nontender Vascular: No leg edema   Lab Results:  Lab Results  Component Value Date   WBC 5.8 03/18/2017   HGB 15.9 03/18/2017   HCT 47.4 03/18/2017   MCV 95.1 03/18/2017   PLT 134 (L) 03/18/2017   NEUTROABS 4.6 03/18/2017    CMP     Component Value Date/Time   NA 139 03/03/2017 1400   K 4.7 03/03/2017 1400   CL 101 01/08/2017 1012   CO2 32 (H) 03/03/2017 1400   GLUCOSE 94 03/03/2017 1400   BUN 18.4 03/03/2017 1400   CREATININE 0.9 03/03/2017 1400   CALCIUM 9.4 03/03/2017 1400   PROT 6.5 03/03/2017 1400   ALBUMIN 4.1 03/03/2017 1400   AST 29 03/03/2017 1400   ALT 24 03/03/2017 1400   ALKPHOS 64 03/03/2017 1400   BILITOT 1.08 03/03/2017 1400   GFRNONAA >60 04/17/2016 0405   GFRAA >60 04/17/2016 0405     Medications: I have reviewed the patient's current medications.  Assessment/Plan: 1. Pancreatic neuroendocrine tumor, WHO grade 2, pancreatic head mass and small peripancreatic/celiac nodes on a CT 02/04/2014  EUS revealed evidence of multiple liver metastases-status post an FNA biopsy of a  left liver lesion 02/10/2014 confirming a neuroendocrine tumor   Octreotide scan 04/01/2014 with multiple foci of metastatic neuroendocrine tumor in the liver  Monthly Sandostatin started 03/16/2014  Restaging CT 07/04/2014 with resolution of a previously noted pancreas head lesion and probable progression of liver metastases  Restaging CT 10/31/2014-stable liver lesions, no evidence of a pancreas mass, stable.  Restaging CT 02/28/2015-stable hepatic metastases, stable pancreas lesion unchanged  Restaging CT 08/30/2015-stable pancreas mass, slight enlargement of hepatic metastases  Monthly Sandostatin continued  Restaging CTs06/14/2017 revealed enlargement of liver lesions, no new lesions, enlargement of the pancreas head mass  Gallium DOTATATEscan 10/01/2016 confirmed uptake in the liver metastases, pancreas primary, peripancreatic adenopathy, and left iliac bone  Cycle 1 Lutathera 03/05/2017  2. 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 chronic exertional dyspnea following treatment for the seminoma  5. aortic stenosis on an echocardiogram December 2011, severe aortic stenosis on echocardiogram 12/06/2015, status postTAVR on 04/16/2016 6. lumbar disc surgery 2014  7. acute abdominal pain 02/04/2014-resolved     Disposition:  Ronald Thompson appears stable. He tolerated the Lutathera well. He will be scheduled for the next dose of Sandostatin on 04/02/2017. He is scheduled for cycle 2 Lutathera on 04/30/2017. He will be scheduled for Sandostatin that day. He will  return for an office and lab visit on 05/14/2017.  I will discuss the timing of restaging scans with Dr. Leonia Reeves.  25 minutes were spent with the patient today. The majority of the time was used  for counseling and coordination of care.   Donneta Romberg, MD  03/18/2017  10:31 AM

## 2017-03-19 ENCOUNTER — Encounter (HOSPITAL_COMMUNITY)
Admission: RE | Admit: 2017-03-19 | Discharge: 2017-03-19 | Disposition: A | Discharge status: Home / Self Care | Payer: Medicare Other | Source: Ambulatory Visit | Attending: Cardiovascular Disease | Admitting: Cardiovascular Disease

## 2017-03-19 DIAGNOSIS — Z952 Presence of prosthetic heart valve: Secondary | ICD-10-CM | POA: Diagnosis not present

## 2017-03-19 NOTE — Progress Notes (Signed)
Cardiac Individual Treatment Plan  Patient Details  Name: Ronald Thompson MRN: [id number] Date of Birth: 1956-07-19 Referring Provider:     [place] from 11/26/2016 in [place]  Referring Provider  [name], MD.      Initial Encounter Date:    CARDIAC REHAB PHASE II ORIENTATION from 11/26/2016 in [place]  Date  11/26/16  Referring Provider  [name], MD.      Visit Diagnosis: 04/16/16  S/P TAVR (transcatheter aortic valve replacement)  Patient's Home Medications on Admission:  Current Outpatient Prescriptions:  .  acetaminophen (TYLENOL) 500 MG tablet, Take 1,000 mg by mouth every 6 (six) hours as needed for headache., Disp: , Rfl:  .  aspirin EC 81 MG tablet, Take 81 mg by mouth every evening., Disp: , Rfl:  .  calcium carbonate (TUMS - DOSED IN MG ELEMENTAL CALCIUM) 500 MG chewable tablet, Chew 3 tablets by mouth 3 (three) times daily as needed for indigestion or heartburn. , Disp: , Rfl:  .  clindamycin (CLEOCIN) 300 MG capsule, Take 2 capsules by mouth one hour prior to dental appointment (Patient not taking: Reported on 03/18/2017), Disp: 6 capsule, Rfl: 2 .  docusate sodium (COLACE) 100 MG capsule, Take 100 mg by mouth daily as needed for mild constipation., Disp: , Rfl:  .  ibuprofen (ADVIL,MOTRIN) 200 MG tablet, Take 400 mg by mouth every 6 (six) hours as needed for headache or mild pain. , Disp: , Rfl:  .  losartan (COZAAR) 50 MG tablet, Take 1 tablet (50 mg total) by mouth daily., Disp: 90 tablet, Rfl: 3 .  metoprolol tartrate (LOPRESSOR) 25 MG tablet, Take 0.5 tablets (12.5 mg total) by mouth 2 (two) times daily., Disp: 60 tablet, Rfl: 11 .  Octreotide Acetate (SANDOSTATIN IJ), Inject 1 each as directed every 30 (thirty) days. , Disp: , Rfl:  .  ondansetron (ZOFRAN) 8 MG tablet, Take 1 tablet (8 mg total) by mouth 2 (two) times daily as needed for nausea or vomiting., Disp:  20 tablet, Rfl: 0 .  oxyCODONE (OXY IR/ROXICODONE) 5 MG immediate release tablet, 1-2 tabs PO Q 6 hours PRN pain. (Patient not taking: Reported on 12/02/2016), Disp: 45 tablet, Rfl: 0 .  promethazine-codeine (PHENERGAN WITH CODEINE) 6.25-10 MG/5ML syrup, Take 5 mLs by mouth every 6 (six) hours as needed. (Patient not taking: Reported on 12/02/2016), Disp: 180 mL, Rfl: 0 .  sennosides-docusate sodium (SENOKOT-S) 8.6-50 MG tablet, Take 1 tablet by mouth as needed for constipation., Disp: , Rfl:  .  simvastatin (ZOCOR) 20 MG tablet, TAKE ONE TABLET BY MOUTH DAILY AT 6 PM, Disp: 90 tablet, Rfl: 3 .  traMADol (ULTRAM) 50 MG tablet, Take 1 tablet (50 mg total) by mouth every 4 (four) hours as needed for moderate pain. (Patient not taking: Reported on 12/02/2016), Disp: 28 tablet, Rfl: 0  Past Medical History: Past Medical History:  Diagnosis Date  . Baker's cyst   . Gallstones   . Heart murmur   . Hypercholesteremia   . Hypertension   . Lesion of left lung    hx.25 yrs ago- testicilar cancer related- only scarring left after tx.-no problems now  . Liver metastasis ([place])   . 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  . Pericarditis    2nd to tumor  . Pneumonia 1990  . Primary neuroendocrine tumor of  pancreas 02/14/2014   Stage IV with multiple mets to liver  . Pulmonary fibrosis ([place]) 11/08/2015  . S/P TAVR (transcatheter aortic valve replacement) 04/16/2016   26 mm Edwards Sapien 3 transcatheter heart valve placed via percutaneous right transfemoral approach   . Shortness of breath dyspnea    with exertion and when tired  . 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 Transfusion history    hx. 25 yrs ago-during cancer tx.    Tobacco Use: History  Smoking Status  . Never Smoker  Smokeless Tobacco  . Never Used    Labs: Recent Review  Flowsheet Data    Labs for ITP Cardiac and Pulmonary Rehab Latest Ref Rng & Units 04/16/2016 04/16/2016 04/16/2016 04/16/2016 01/08/2017   Cholestrol 0 - 200 mg/dL - - - - 131   LDLCALC 0 - 99 mg/dL - - - - 71   LDLDIRECT mg/dL - - - - -   HDL >39.00 mg/dL - - - - 48.80   Trlycerides 0.0 - 149.0 mg/dL - - - - 56.0   Hemoglobin A1c 4.6 - 6.5 % - - - - 6.2   PHART 7.350 - 7.450 7.367 - - 7.345(L) -   PCO2ART 32.0 - 48.0 mmHg 53.1(H) - - 54.9(H) -   HCO3 20.0 - 28.0 mmol/L 30.4(H) - - 30.0(H) -   TCO2 0 - 100 mmol/L 32 30 29 32 -   O2SAT % 100.0 - - 96.0 -      Capillary Blood Glucose: No results found for: GLUCAP   Exercise Target Goals:    Exercise Program Goal: Individual exercise prescription set with THRR, safety & activity barriers. Participant demonstrates ability to understand and report RPE using BORG scale, to self-measure pulse accurately, and to acknowledge the importance of the exercise prescription.  Exercise Prescription Goal: Starting with aerobic activity 30 plus minutes a day, 3 days per week for initial exercise prescription. Provide home exercise prescription and guidelines that participant acknowledges understanding prior to discharge.  Activity Barriers & Risk Stratification:     Activity Barriers & Cardiac Risk Stratification - 11/26/16 0855      Activity Barriers & Cardiac Risk Stratification   Activity Barriers None   Cardiac Risk Stratification High      6 Minute Walk:     6 Minute Walk    Row Name 11/26/16 1415 02/24/17 1252       6 Minute Walk   Phase Initial Discharge    Distance 1843 feet 2125 feet    Distance % Change  - 15.3 %    Walk Time 6 minutes 6 minutes    # of Rest Breaks 0 0    MPH 3.49 4    METS 4.41 5.2    RPE 11 12    VO2 Peak 15.43 18.1    Symptoms No No    Resting HR 70 bpm 72 bpm    Resting BP 124/84 118/84    Max Ex. HR 104 bpm 111 bpm    Max Ex. BP 142/94 168/80    2 Minute Post BP 142/78 132/80       Oxygen  Initial Assessment:   Oxygen Re-Evaluation:   Oxygen Discharge (Final Oxygen Re-Evaluation):   Initial Exercise Prescription:     Initial Exercise Prescription - 11/26/16 1400      Date of Initial Exercise RX and Referring Provider   Date 11/26/16   Referring Provider Burt Knack,  Legrand Como, MD.     Treadmill   MPH 2.4   Grade 2   Minutes 10   METs 3.5     Bike   Level 1.5   Minutes 10   METs 4.33     NuStep   Level 4   SPM 90   Minutes 10   METs 3     Prescription Details   Frequency (times per week) 3   Duration Progress to 30 minutes of continuous aerobic without signs/symptoms of physical distress     Intensity   THRR 40-80% of Max Heartrate 63-126   Ratings of Perceived Exertion 11-13   Perceived Dyspnea 0-4     Progression   Progression Continue to progress workloads to maintain intensity without signs/symptoms of physical distress.     Resistance Training   Training Prescription Yes   Weight 5lb   Reps 10-15      Perform Capillary Blood Glucose checks as needed.  Exercise Prescription Changes:     Exercise Prescription Changes    Row Name 12/09/16 1600 01/13/17 1600 02/12/17 1600 03/13/17 1500       Response to Exercise   Blood Pressure (Admit) 112/64 120/80 120/80 140/82    Blood Pressure (Exercise) 134/88 150/90 148/88 168/88    Blood Pressure (Exit) 126/82 128/82 122/84 120/80    Heart Rate (Admit) 66 bpm 79 bpm 68 bpm 79 bpm    Heart Rate (Exercise) 110 bpm 118 bpm 115 bpm 101 bpm    Heart Rate (Exit) 75 bpm 80 bpm 71 bpm 83 bpm    Rating of Perceived Exertion (Exercise) 11 12 11 12     Duration Progress to 45 minutes of aerobic exercise without signs/symptoms of physical distress Progress to 45 minutes of aerobic exercise without signs/symptoms of physical distress Progress to 45 minutes of aerobic exercise without signs/symptoms of physical distress Progress to 45 minutes of aerobic exercise without signs/symptoms of physical distress     Intensity THRR unchanged THRR unchanged THRR unchanged THRR unchanged      Progression   Progression Continue to progress workloads to maintain intensity without signs/symptoms of physical distress. Continue to progress workloads to maintain intensity without signs/symptoms of physical distress. Continue to progress workloads to maintain intensity without signs/symptoms of physical distress. Continue to progress workloads to maintain intensity without signs/symptoms of physical distress.    Average METs 2.8 4.1 4.2 4.3      Resistance Training   Training Prescription Yes Yes Yes Yes    Weight 5lb 5lb 8lbs 8lbs    Reps 10-15 10-15 10-15 10-15      Treadmill   MPH 3 3 3.5 3.5    Grade 0 0 2 2    Minutes 10 10 10 10     METs 3.3 3.3 4.65 4.65      Bike   Level 1 1 1.5 1.5    Minutes 10 10 10 10     METs 3.7 3.7 4.37 4.43      NuStep   Level 1 1 5 5     SPM 90 90 90 90    Minutes 10 10 10 10     METs 3.1 3.1 3.6 3.7      Home Exercise Plan   Plans to continue exercise at  - Home (comment) Home (comment) Home (comment)    Frequency  - Add 3 additional days to program exercise sessions. Add 3 additional days to program exercise sessions. Add 3 additional days to program exercise sessions.  Initial Home Exercises Provided  - 12/13/16 12/13/16 12/13/16       Exercise Comments:     Exercise Comments    Row Name 12/25/16 0749 01/20/17 0857 02/17/17 0805 03/13/17 1558     Exercise Comments Reviewed METs and goals with pt.  Pt is doing well with exercise.  Reviewed METs and goals with pt.  Reviewed METs and goals with pt.  Reviewed goals with pt.        Exercise Goals and Review:     Exercise Goals    Row Name 11/26/16 0850             Exercise Goals   Increase Physical Activity Yes       Intervention Provide advice, education, support and counseling about physical activity/exercise needs.;Develop an individualized exercise prescription for aerobic and resistive training  based on initial evaluation findings, risk stratification, comorbidities and participant's personal goals.       Expected Outcomes Achievement of increased cardiorespiratory fitness and enhanced flexibility, muscular endurance and strength shown through measurements of functional capacity and personal statement of participant.       Increase Strength and Stamina Yes  Be able to climb stairs; improve overall cardiorespiratory fitness       Intervention Provide advice, education, support and counseling about physical activity/exercise needs.;Develop an individualized exercise prescription for aerobic and resistive training based on initial evaluation findings, risk stratification, comorbidities and participant's personal goals.       Expected Outcomes Achievement of increased cardiorespiratory fitness and enhanced flexibility, muscular endurance and strength shown through measurements of functional capacity and personal statement of participant.          Exercise Goals Re-Evaluation :     Exercise Goals Re-Evaluation    Row Name 12/25/16 0747 01/20/17 0856 02/12/17 1647 03/13/17 1558       Exercise Goal Re-Evaluation   Exercise Goals Review Increase Physical Activity;Increase Strenth and Stamina Increase Physical Activity;Increase Strenth and Stamina Increase Physical Activity;Increase Strenth and Stamina Increase Physical Activity;Increase Strenth and Stamina    Comments Pt states he feels more comfortable with aerobic exercise and he is tolerating workload increases well.  He states he does resistance training 3xs/week at home on his off days from [facility] and he walks 72min/day with his spouse. Pt is doing great with exercise and is tolerating workload increases well.  His BP has also improved with exercise and he states he is still doing RT at home on T, [place] and [place]. Pt is doing great with exercise and is tolerating workload increases well.  His BP has also improved with exercise and he states he is  still doing RT at home on T, [place] and [place]. Pt is doing great with exercise and is tolerating workload increases well.  He is still doing RT at home on T, [place] and [place].    Expected Outcomes Continue with exercises Rx, HEP and increase workloads as tolerated in order to increase cardiorespiratory fitness level Continue with exercises Rx, HEP and increase workloads as tolerated in order to increase cardiorespiratory fitness level Continue with exercises Rx, HEP and increase workloads as tolerated in order to increase cardiorespiratory fitness level Continue with exercises Rx, HEP and increase workloads as tolerated in order to increase cardiorespiratory fitness level        Discharge Exercise Prescription (Final Exercise Prescription Changes):     Exercise Prescription Changes - 03/13/17 1500      Response to Exercise   Blood Pressure (Admit) 140/82  Blood Pressure (Exercise) 168/88   Blood Pressure (Exit) 120/80   Heart Rate (Admit) 79 bpm   Heart Rate (Exercise) 101 bpm   Heart Rate (Exit) 83 bpm   Rating of Perceived Exertion (Exercise) 12   Duration Progress to 45 minutes of aerobic exercise without signs/symptoms of physical distress   Intensity THRR unchanged     Progression   Progression Continue to progress workloads to maintain intensity without signs/symptoms of physical distress.   Average METs 4.3     Resistance Training   Training Prescription Yes   Weight 8lbs   Reps 10-15     Treadmill   MPH 3.5   Grade 2   Minutes 10   METs 4.65     Bike   Level 1.5   Minutes 10   METs 4.43     NuStep   Level 5   SPM 90   Minutes 10   METs 3.7     Home Exercise Plan   Plans to continue exercise at Home (comment)   Frequency Add 3 additional days to program exercise sessions.   Initial Home Exercises Provided 12/13/16      Nutrition:  Target Goals: Understanding of nutrition guidelines, daily intake of sodium 1500mg , cholesterol 200mg , calories 30% from fat and 7% or  less from saturated fats, daily to have 5 or more servings of fruits and vegetables.  Biometrics:     Pre Biometrics - 11/26/16 1423      Pre Biometrics   Height 5' 9.5" (1.765 m)   Weight 187 lb 6.3 oz (85 kg)   Waist Circumference 36.75 inches   Hip Circumference 41 inches   Waist to Hip Ratio 0.9 %   BMI (Calculated) 27.3   Triceps Skinfold 15 mm   % Body Fat 25.5 %   Grip Strength 50 kg   Flexibility 7.5 in   Single Leg Stand 19.84 seconds         Post Biometrics - 02/24/17 1257       Post  Biometrics   Weight 182 lb 1.6 oz (82.6 kg)   Waist Circumference 36 inches   Hip Circumference 38.5 inches   Waist to Hip Ratio 0.94 %   Triceps Skinfold 15 mm   % Body Fat 24.9 %   Grip Strength 44 kg   Flexibility 10 in   Single Leg Stand 25 seconds      Nutrition Therapy Plan and Nutrition Goals:     Nutrition Therapy & Goals - 01/01/17 0810      Nutrition Therapy   Diet Therapeutic Lifestyle Changes     Personal Nutrition Goals   Nutrition Goal Pt to maintain his current wt while in Cardiac Rehab   Personal Goal #2 Pt to identify and limit food sources of saturated fat, trans fat, and sodium     Intervention Plan   Intervention Prescribe, educate and counsel regarding individualized specific dietary modifications aiming towards targeted core components such as weight, hypertension, lipid management, diabetes, heart failure and other comorbidities.   Expected Outcomes Short Term Goal: Understand basic principles of dietary content, such as calories, fat, sodium, cholesterol and nutrients.;Long Term Goal: Adherence to prescribed nutrition plan.      Nutrition Discharge: Nutrition Scores:     Nutrition Assessments - 01/01/17 0810      MEDFICTS Scores   Pre Score 46      Nutrition Goals Re-Evaluation:   Nutrition Goals Re-Evaluation:   Nutrition Goals Discharge (Final Nutrition Goals  Re-Evaluation):   Psychosocial: Target Goals: Acknowledge presence  or absence of significant depression and/or stress, maximize coping skills, provide positive support system. Participant is able to verbalize types and ability to use techniques and skills needed for reducing stress and depression.  Initial Review & Psychosocial Screening:     Initial Psych Review & Screening - 11/26/16 1705      Initial Review   Current issues with None Identified     Family Dynamics   Good Support System? Yes     Barriers   Psychosocial barriers to participate in program There are no identifiable barriers or psychosocial needs.     Screening Interventions   Interventions Encouraged to exercise      Quality of Life Scores:     Quality of Life - 11/26/16 0955      Quality of Life Scores   Health/Function Pre 14.43 %   Socioeconomic Pre 24.43 %   Psych/Spiritual Pre 21.21 %   Family Pre 22.8 %   GLOBAL Pre 19.12 %      PHQ-9: Recent Review Flowsheet Data    Depression screen [facility] 2/9 01/08/2017 02/03/2014   Decreased Interest 0 0   Down, Depressed, Hopeless 0 0   PHQ - 2 Score 0 0   Altered sleeping 0 -   Tired, decreased energy 0 -   Change in appetite 0 -   Feeling bad or failure about yourself  0 -   Trouble concentrating 0 -   Moving slowly or fidgety/restless 0 -   Suicidal thoughts 0 -   PHQ-9 Score 0 -     Interpretation of Total Score  Total Score Depression Severity:  1-4 = Minimal depression, 5-9 = Mild depression, 10-14 = Moderate depression, 15-19 = Moderately severe depression, 20-27 = Severe depression   Psychosocial Evaluation and Intervention:     Psychosocial Evaluation - 12/02/16 1115      Psychosocial Evaluation & Interventions   Interventions Encouraged to exercise with the program and follow exercise prescription   Comments no psychosocial needs identified, no interventions necessary    Expected Outcomes pt will demonstrate positive outlook with good coping skills.    Continue Psychosocial Services  No Follow up required       Psychosocial Re-Evaluation:     Psychosocial Re-Evaluation    Benjamin Perez Name 12/24/16 1054 01/08/17 1028 02/12/17 1151 02/18/17 1518 03/14/17 0706     Psychosocial Re-Evaluation   Current issues with None Identified None Identified None Identified None Identified None Identified   Comments pt displays eagerness to particiate in [facility] activities.   pt displays eagerness to particiate in [place] activities.   pt displays eagerness to particiate in [place] activities.  pt enjoys staying busy and living life to the fullest. He reports he has new perspective since his cancer diagnosis to savor each day.  pt displays eagerness to particiate in [place] activities.  pt enjoys staying busy and living life to the fullest. He reports he has new perspective since his cancer diagnosis to savor each day.  pt displays eagerness to particiate in [place] activities.  pt enjoys staying busy and living life to the fullest. He reports he has new perspective since his cancer diagnosis to savor each day.    Expected Outcomes pt will exhibit positive outlook with good coping skills.  pt will exhibit positive outlook with good coping skills.  pt will exhibit positive outlook with good coping skills.  pt will exhibit positive outlook with good coping skills.  pt will exhibit positive outlook with good coping skills.    Interventions Encouraged to attend Cardiac Rehabilitation for the exercise Encouraged to attend Cardiac Rehabilitation for the exercise Encouraged to attend Cardiac Rehabilitation for the exercise Encouraged to attend Cardiac Rehabilitation for the exercise Encouraged to attend Cardiac Rehabilitation for the exercise      Psychosocial Discharge (Final Psychosocial Re-Evaluation):     Psychosocial Re-Evaluation - 03/14/17 0706      Psychosocial Re-Evaluation   Current issues with None Identified   Comments pt displays eagerness to particiate in [place] activities.  pt enjoys staying busy and living life to the fullest. He reports  he has new perspective since his cancer diagnosis to savor each day.    Expected Outcomes pt will exhibit positive outlook with good coping skills.    Interventions Encouraged to attend Cardiac Rehabilitation for the exercise      Vocational Rehabilitation: Provide vocational rehab assistance to qualifying candidates.   Vocational Rehab Evaluation & Intervention:     Vocational Rehab - 11/26/16 1704      Initial Vocational Rehab Evaluation & Intervention   Assessment shows need for Vocational Rehabilitation No      Education: Education Goals: Education classes will be provided on a weekly basis, covering required topics. Participant will state understanding/return demonstration of topics presented.  Learning Barriers/Preferences:     Learning Barriers/Preferences - 11/26/16 0849      Learning Barriers/Preferences   Learning Barriers Sight   Learning Preferences Written Material      Education Topics: Count Your Pulse:  -Group instruction provided by verbal instruction, demonstration, patient participation and written materials to support subject.  Instructors address importance of being able to find your pulse and how to count your pulse when at home without a heart monitor.  Patients get hands on experience counting their pulse with staff help and individually.   Heart Attack, Angina, and Risk Factor Modification:  -Group instruction provided by verbal instruction, video, and written materials to support subject.  Instructors address signs and symptoms of angina and heart attacks.    Also discuss risk factors for heart disease and how to make changes to improve heart health risk factors.   Functional Fitness:  -Group instruction provided by verbal instruction, demonstration, patient participation, and written materials to support subject.  Instructors address safety measures for doing things around the house.  Discuss how to get up and down off the floor, how to pick  things up properly, how to safely get out of a chair without assistance, and balance training.   Meditation and Mindfulness:  -Group instruction provided by verbal instruction, patient participation, and written materials to support subject.  Instructor addresses importance of mindfulness and meditation practice to help reduce stress and improve awareness.  Instructor also leads participants through a meditation exercise.    Stretching for Flexibility and Mobility:  -Group instruction provided by verbal instruction, patient participation, and written materials to support subject.  Instructors lead participants through series of stretches that are designed to increase flexibility thus improving mobility.  These stretches are additional exercise for major muscle groups that are typically performed during regular warm up and cool down.   Hands Only CPR:  -Group verbal, video, and participation provides a basic overview of AHA guidelines for community CPR. Role-play of emergencies allow participants the opportunity to practice calling for help and chest compression technique with discussion of AED use.   Hypertension: -Group verbal and written instruction that provides a basic overview of  hypertension including the most recent diagnostic guidelines, risk factor reduction with self-care instructions and medication management.    Nutrition I class: Heart Healthy Eating:  -Group instruction provided by PowerPoint slides, verbal discussion, and written materials to support subject matter. The instructor gives an explanation and review of the Therapeutic Lifestyle Changes diet recommendations, which includes a discussion on lipid goals, dietary fat, sodium, fiber, plant stanol/sterol esters, sugar, and the components of a well-balanced, healthy diet.   CARDIAC REHAB PHASE II EXERCISE from 01/01/2017 in [place]  Date  01/01/17  Educator  RD  Instruction Review Code   Not applicable [class handouts given]      Nutrition II class: Lifestyle Skills:  -Group instruction provided by PowerPoint slides, verbal discussion, and written materials to support subject matter. The instructor gives an explanation and review of label reading, grocery shopping for heart health, heart healthy recipe modifications, and ways to make healthier choices when eating out.   CARDIAC REHAB PHASE II EXERCISE from 01/01/2017 in [place]  Date  01/01/17  Educator  RD  Instruction Review Code  Not applicable [class handouts given]      Diabetes Question & Answer:  -Group instruction provided by PowerPoint slides, verbal discussion, and written materials to support subject matter. The instructor gives an explanation and review of diabetes co-morbidities, pre- and post-prandial blood glucose goals, pre-exercise blood glucose goals, signs, symptoms, and treatment of hypoglycemia and hyperglycemia, and foot care basics.   Diabetes Blitz:  -Group instruction provided by PowerPoint slides, verbal discussion, and written materials to support subject matter. The instructor gives an explanation and review of the physiology behind type 1 and type 2 diabetes, diabetes medications and rational behind using different medications, pre- and post-prandial blood glucose recommendations and Hemoglobin A1c goals, diabetes diet, and exercise including blood glucose guidelines for exercising safely.    Portion Distortion:  -Group instruction provided by PowerPoint slides, verbal discussion, written materials, and food models to support subject matter. The instructor gives an explanation of serving size versus portion size, changes in portions sizes over the last 20 years, and what consists of a serving from each food group.   Stress Management:  -Group instruction provided by verbal instruction, video, and written materials to support subject matter.  Instructors review  role of stress in heart disease and how to cope with stress positively.     Exercising on Your Own:  -Group instruction provided by verbal instruction, power point, and written materials to support subject.  Instructors discuss benefits of exercise, components of exercise, frequency and intensity of exercise, and end points for exercise.  Also discuss use of nitroglycerin and activating EMS.  Review options of places to exercise outside of rehab.  Review guidelines for sex with heart disease.   Cardiac Drugs I:  -Group instruction provided by verbal instruction and written materials to support subject.  Instructor reviews cardiac drug classes: antiplatelets, anticoagulants, beta blockers, and statins.  Instructor discusses reasons, side effects, and lifestyle considerations for each drug class.   Cardiac Drugs II:  -Group instruction provided by verbal instruction and written materials to support subject.  Instructor reviews cardiac drug classes: angiotensin converting enzyme inhibitors (ACE-I), angiotensin II receptor blockers (ARBs), nitrates, and calcium channel blockers.  Instructor discusses reasons, side effects, and lifestyle considerations for each drug class.   Anatomy and Physiology of the Circulatory System:  Group verbal and written instruction and models provide basic cardiac anatomy and physiology,  with the coronary electrical and arterial systems. Review of: AMI, Angina, Valve disease, Heart Failure, Peripheral Artery Disease, Cardiac Arrhythmia, Pacemakers, and the ICD.   Other Education:  -Group or individual verbal, written, or video instructions that support the educational goals of the cardiac rehab program.   Knowledge Questionnaire Score:     Knowledge Questionnaire Score - 11/26/16 0955      Knowledge Questionnaire Score   Pre Score 20/24      Core Components/Risk Factors/Patient Goals at Admission:     Personal Goals and Risk Factors at Admission - 11/26/16  1407      Core Components/Risk Factors/Patient Goals on Admission   Hypertension Yes   Intervention Provide education on lifestyle modifcations including regular physical activity/exercise, weight management, moderate sodium restriction and increased consumption of fresh fruit, vegetables, and low fat dairy, alcohol moderation, and smoking cessation.;Monitor prescription use compliance.   Expected Outcomes Short Term: Continued assessment and intervention until BP is < 140/92mm HG in hypertensive participants. < 130/46mm HG in hypertensive participants with diabetes, heart failure or chronic kidney disease.;Long Term: Maintenance of blood pressure at goal levels.   Lipids Yes   Intervention Provide education and support for participant on nutrition & aerobic/resistive exercise along with prescribed medications to achieve LDL 70mg , HDL >40mg .   Expected Outcomes Short Term: Participant states understanding of desired cholesterol values and is compliant with medications prescribed. Participant is following exercise prescription and nutrition guidelines.;Long Term: Cholesterol controlled with medications as prescribed, with individualized exercise RX and with personalized nutrition plan. Value goals: LDL < 70mg , HDL > 40 mg.   Personal Goal Other Yes   Personal Goal Increase stamina: be able to climb stairs. Improve cardiorespiratory fitness.   Intervention Develop an individual exercise prescription for aerobic and resistive training based on evaluation findings, risk stratification, comorbidiites and participant's personal goals.   Expected Outcomes Achieve increased cardiorespiratory fitness and enhanced flexibilty, endurance and strength shown through measures of functional capacity and personal statements of participant.      Core Components/Risk Factors/Patient Goals Review:      Goals and Risk Factor Review    Row Name 12/24/16 1053 01/08/17 1027 02/12/17 1150 02/18/17 1518 03/14/17 0706      Core Components/Risk Factors/Patient Goals Review   Personal Goals Review Hypertension;Lipids Hypertension;Lipids Hypertension;Lipids Hypertension;Lipids Hypertension;Lipids   Review pt demonstrates eagerness to participate in CR activities.  pt demonstrates eagerness to participate in CR activities. pt is exercising on his own at home doing yard work and weight lifting.  pt reports he has made dietary changes since meeting with Ronald Thompson, RD.  pt demonstrates eagerness to participate in CR activities. pt is exercising on his own at home doing yard work and weight lifting.  pt reports he is pleased his blood pressure values are improving.   pt demonstrates eagerness to participate in CR activities. pt is exercising on his own at home doing yard work and weight lifting.  BP values WNL at CR.   pt demonstrates eagerness to participate in CR activities. pt is exercising on his own at home doing yard work and weight lifting.  BP values WNL at CR.     Expected Outcomes pt will participate in CR exercise, nutrition and lifestyle education opportunities to decrease overall CAD RF.   pt will participate in CR exercise, nutrition and lifestyle education opportunities to decrease overall CAD RF.   pt will participate in CR exercise, nutrition and lifestyle education opportunities to decrease overall CAD RF.  pt will participate in CR exercise, nutrition and lifestyle education opportunities to decrease overall CAD RF.   pt will participate in CR exercise, nutrition and lifestyle education opportunities to decrease overall CAD RF.        Core Components/Risk Factors/Patient Goals at Discharge (Final Review):      Goals and Risk Factor Review - 03/14/17 0706      Core Components/Risk Factors/Patient Goals Review   Personal Goals Review Hypertension;Lipids   Review pt demonstrates eagerness to participate in CR activities. pt is exercising on his own at home doing yard work and weight lifting.  BP values WNL  at CR.     Expected Outcomes pt will participate in CR exercise, nutrition and lifestyle education opportunities to decrease overall CAD RF.        ITP Comments:     ITP Comments    Row Name 11/26/16 0846 01/21/17 0720 02/18/17 1517 03/19/17 1128     ITP Comments Medical Director, Dr. Fransico Him Medical Director, Dr. Fransico Him Medical Director, Dr. Fransico Him Medical Director, Dr. Fransico Him       Comments: Pt is making expected progress toward personal goals after completing 34 sessions. Recommend continued exercise and life style modification education including  stress management and relaxation techniques to decrease cardiac risk profile.

## 2017-03-21 ENCOUNTER — Encounter (HOSPITAL_COMMUNITY)
Admission: RE | Admit: 2017-03-21 | Discharge: 2017-03-21 | Disposition: A | Discharge status: Home / Self Care | Payer: Medicare Other | Source: Ambulatory Visit | Attending: Cardiovascular Disease | Admitting: Cardiovascular Disease

## 2017-03-21 DIAGNOSIS — Z952 Presence of prosthetic heart valve: Secondary | ICD-10-CM

## 2017-03-24 ENCOUNTER — Ambulatory Visit (INDEPENDENT_AMBULATORY_CARE_PROVIDER_SITE_OTHER)
Admission: RE | Admit: 2017-03-24 | Discharge: 2017-03-24 | Disposition: A | Discharge status: Home / Self Care | Payer: Medicare Other | Source: Ambulatory Visit | Attending: Nurse Practitioner | Admitting: Nurse Practitioner

## 2017-03-24 ENCOUNTER — Encounter (HOSPITAL_COMMUNITY)
Admission: RE | Admit: 2017-03-24 | Discharge: 2017-03-24 | Disposition: A | Discharge status: Home / Self Care | Payer: Medicare Other | Source: Ambulatory Visit | Attending: Cardiovascular Disease | Admitting: Cardiovascular Disease

## 2017-03-24 ENCOUNTER — Telehealth: Payer: Self-pay | Admitting: *Deleted

## 2017-03-24 ENCOUNTER — Encounter: Payer: Self-pay | Admitting: Nurse Practitioner

## 2017-03-24 ENCOUNTER — Ambulatory Visit (INDEPENDENT_AMBULATORY_CARE_PROVIDER_SITE_OTHER): Payer: Medicare Other | Admitting: Nurse Practitioner

## 2017-03-24 ENCOUNTER — Encounter (HOSPITAL_COMMUNITY): Payer: Self-pay

## 2017-03-24 VITALS — BP 126/80 | HR 89 | Temp 97.8°F | Ht 69.5 in | Wt 180.0 lb

## 2017-03-24 VITALS — Ht 69.5 in | Wt 180.6 lb

## 2017-03-24 DIAGNOSIS — Z952 Presence of prosthetic heart valve: Secondary | ICD-10-CM

## 2017-03-24 DIAGNOSIS — J9801 Acute bronchospasm: Secondary | ICD-10-CM | POA: Diagnosis not present

## 2017-03-24 DIAGNOSIS — J209 Acute bronchitis, unspecified: Secondary | ICD-10-CM

## 2017-03-24 DIAGNOSIS — R0989 Other specified symptoms and signs involving the circulatory and respiratory systems: Secondary | ICD-10-CM | POA: Diagnosis not present

## 2017-03-24 DIAGNOSIS — R0602 Shortness of breath: Secondary | ICD-10-CM

## 2017-03-24 DIAGNOSIS — R05 Cough: Secondary | ICD-10-CM | POA: Diagnosis not present

## 2017-03-24 MED ORDER — ALBUTEROL SULFATE HFA 108 (90 BASE) MCG/ACT IN AERS: 1.0000 | INHALATION_SPRAY | Freq: Four times a day (QID) | RESPIRATORY_TRACT | 0 refills | Status: DC | PRN

## 2017-03-24 MED ORDER — AZITHROMYCIN 250 MG PO TABS: 250.0000 mg | ORAL_TABLET | Freq: Every day | ORAL | 0 refills | Status: DC

## 2017-03-24 NOTE — Telephone Encounter (Signed)
Called pt, MD is requesting labs 2 days prior to next Lutathera treatment. Informed him schedulers will call with appt. He requests appt around 10AM. Pt reports he saw PCP for bronchitis. Was prescribed Zpack. Feels better after taking initial dose.

## 2017-03-24 NOTE — Progress Notes (Signed)
Subjective:  Patient ID: Ronald Thompson, male    DOB: 1956-06-20  Age: 61 y.o. MRN: [id number]  CC: Cough (coughing and congestion going on for 4 days. normally get z pak per pt--bronchitis? getting treatment for cancer)   Cough  This is a recurrent problem. The current episode started in the past 7 days. The problem has been waxing and waning. The problem occurs constantly. The cough is productive of sputum. Associated symptoms include chills, myalgias, nasal congestion, postnasal drip, rhinorrhea, shortness of breath and wheezing. Pertinent negatives include no chest pain, fever or sore throat. The symptoms are aggravated by exercise. He has tried OTC cough suppressant for the symptoms. The treatment provided no relief. pulmonary fibrosis secondary to cancer treatment.    Outpatient Medications Prior to Visit  Medication Sig Dispense Refill  . acetaminophen (TYLENOL) 500 MG tablet Take 1,000 mg by mouth every 6 (six) hours as needed for headache.    Marland Kitchen aspirin EC 81 MG tablet Take 81 mg by mouth every evening.    . calcium carbonate (TUMS - DOSED IN MG ELEMENTAL CALCIUM) 500 MG chewable tablet Chew 3 tablets by mouth 3 (three) times daily as needed for indigestion or heartburn.     . clindamycin (CLEOCIN) 300 MG capsule Take 2 capsules by mouth one hour prior to dental appointment 6 capsule 2  . docusate sodium (COLACE) 100 MG capsule Take 100 mg by mouth daily as needed for mild constipation.    Marland Kitchen ibuprofen (ADVIL,MOTRIN) 200 MG tablet Take 400 mg by mouth every 6 (six) hours as needed for headache or mild pain.     Marland Kitchen losartan (COZAAR) 50 MG tablet Take 1 tablet (50 mg total) by mouth daily. 90 tablet 3  . metoprolol tartrate (LOPRESSOR) 25 MG tablet Take 0.5 tablets (12.5 mg total) by mouth 2 (two) times daily. 60 tablet 11  . Octreotide Acetate (SANDOSTATIN IJ) Inject 1 each as directed every 30 (thirty) days.     . ondansetron (ZOFRAN) 8 MG tablet Take 1 tablet (8 mg total) by mouth 2 (two)  times daily as needed for nausea or vomiting. 20 tablet 0  . oxyCODONE (OXY IR/ROXICODONE) 5 MG immediate release tablet 1-2 tabs PO Q 6 hours PRN pain. 45 tablet 0  . promethazine-codeine (PHENERGAN WITH CODEINE) 6.25-10 MG/5ML syrup Take 5 mLs by mouth every 6 (six) hours as needed. 180 mL 0  . sennosides-docusate sodium (SENOKOT-S) 8.6-50 MG tablet Take 1 tablet by mouth as needed for constipation.    . simvastatin (ZOCOR) 20 MG tablet TAKE ONE TABLET BY MOUTH DAILY AT 6 PM 90 tablet 3  . traMADol (ULTRAM) 50 MG tablet Take 1 tablet (50 mg total) by mouth every 4 (four) hours as needed for moderate pain. 28 tablet 0   No facility-administered medications prior to visit.     ROS See HPI  Objective:  BP 126/80   Pulse 89   Temp 97.8 F (36.6 C)   Ht 5' 9.5" (1.765 m)   Wt 180 lb (81.6 kg)   SpO2 97%   BMI 26.20 kg/m   BP Readings from Last 3 Encounters:  03/24/17 126/80  03/18/17 (!) 142/73  03/05/17 114/64    Wt Readings from Last 3 Encounters:  03/24/17 180 lb (81.6 kg)  03/24/17 180 lb 8.9 oz (81.9 kg)  03/18/17 180 lb 12.8 oz (82 kg)    Physical Exam  Constitutional: He is oriented to person, place, and time. No distress.  HENT:  Right  Ear: External ear normal.  Left Ear: External ear normal.  Nose: Nose normal.  Mouth/Throat: Oropharynx is clear and moist. No oropharyngeal exudate.  Neck: Normal range of motion. Neck supple.  Cardiovascular: Normal rate and regular rhythm.   Pulmonary/Chest: Effort normal. No respiratory distress. He has no rales.  Diminished lung sounds in bases  Musculoskeletal: He exhibits no edema.  Lymphadenopathy:    He has no cervical adenopathy.  Neurological: He is alert and oriented to person, place, and time.  Vitals reviewed.   Lab Results  Component Value Date   WBC 5.8 03/18/2017   HGB 15.9 03/18/2017   HCT 47.4 03/18/2017   PLT 134 (L) 03/18/2017   GLUCOSE 106 03/18/2017   CHOL 131 01/08/2017   TRIG 56.0 01/08/2017    HDL 48.80 01/08/2017   LDLDIRECT 139.2 10/10/2008   LDLCALC 71 01/08/2017   ALT 25 03/18/2017   AST 28 03/18/2017   NA 139 03/18/2017   K 4.4 03/18/2017   CL 101 01/08/2017   CREATININE 0.9 03/18/2017   BUN 18.4 03/18/2017   CO2 27 03/18/2017   TSH 0.97 01/08/2017   PSA 1.98 01/08/2017   INR 1.30 04/16/2016   HGBA1C 6.2 01/08/2017    No results found.  Assessment & Plan:   Ronald Thompson was seen today for cough.  Diagnoses and all orders for this visit:  SOB (shortness of breath) on exertion -     DG Chest 2 View; Future -     albuterol (PROVENTIL HFA;VENTOLIN HFA) 108 (90 Base) MCG/ACT inhaler; Inhale 1-2 puffs into the lungs every 6 (six) hours as needed for wheezing or shortness of breath. -     azithromycin (ZITHROMAX Z-PAK) 250 MG tablet; Take 1 tablet (250 mg total) by mouth daily. Take 2tabs on first day, then 1tab once a day till complete  Acute bronchitis, unspecified organism -     DG Chest 2 View; Future -     albuterol (PROVENTIL HFA;VENTOLIN HFA) 108 (90 Base) MCG/ACT inhaler; Inhale 1-2 puffs into the lungs every 6 (six) hours as needed for wheezing or shortness of breath. -     azithromycin (ZITHROMAX Z-PAK) 250 MG tablet; Take 1 tablet (250 mg total) by mouth daily. Take 2tabs on first day, then 1tab once a day till complete  Abnormal lung sounds -     azithromycin (ZITHROMAX Z-PAK) 250 MG tablet; Take 1 tablet (250 mg total) by mouth daily. Take 2tabs on first day, then 1tab once a day till complete   I am having Ronald Thompson start on albuterol and azithromycin. I am also having him maintain his calcium carbonate, ibuprofen, Octreotide Acetate (SANDOSTATIN IJ), docusate sodium, acetaminophen, traMADol, aspirin EC, metoprolol tartrate, clindamycin, oxyCODONE, promethazine-codeine, simvastatin, losartan, ondansetron, and sennosides-docusate sodium.  Meds ordered this encounter  Medications  . albuterol (PROVENTIL HFA;VENTOLIN HFA) 108 (90 Base) MCG/ACT inhaler    Sig:  Inhale 1-2 puffs into the lungs every 6 (six) hours as needed for wheezing or shortness of breath.    Dispense:  1 Inhaler    Refill:  0    Order Specific Question:   Supervising Provider    Answer:   Cassandria Anger [[id number]]  . azithromycin (ZITHROMAX Z-PAK) 250 MG tablet    Sig: Take 1 tablet (250 mg total) by mouth daily. Take 2tabs on first day, then 1tab once a day till complete    Dispense:  6 tablet    Refill:  0    Order Specific Question:  Supervising Provider    Answer:   Cassandria Anger [[id number]]    Follow-up: Return if symptoms worsen or fail to improve.  Wilfred Lacy, NP

## 2017-03-24 NOTE — Patient Instructions (Signed)
Continue mucinex or delsym or robitussin for cough as directed on package.  Encourage adequate oral hydration.  Go to basement for CXR. You will be called with results.

## 2017-03-25 ENCOUNTER — Telehealth: Payer: Self-pay | Admitting: Nurse Practitioner

## 2017-03-25 NOTE — Telephone Encounter (Signed)
Scheduled appt per sch message - left message with appt date and time . 

## 2017-03-26 ENCOUNTER — Telehealth: Payer: Self-pay

## 2017-03-26 ENCOUNTER — Encounter (HOSPITAL_COMMUNITY): Payer: Medicare Other

## 2017-03-26 NOTE — Telephone Encounter (Signed)
Call placed to patient to review his upcoming appointments per his request. Patient instructed that scheduling will call when his 9/26 lab appointment is moved to 9/24. Message sent to scheduling. Pt appreciative of call back and verbalizes understanding.

## 2017-03-28 ENCOUNTER — Other Ambulatory Visit: Payer: Medicare Other

## 2017-03-28 ENCOUNTER — Encounter (HOSPITAL_COMMUNITY): Payer: Medicare Other

## 2017-04-01 ENCOUNTER — Telehealth: Payer: Self-pay | Admitting: Nurse Practitioner

## 2017-04-01 DIAGNOSIS — J209 Acute bronchitis, unspecified: Secondary | ICD-10-CM

## 2017-04-01 MED ORDER — PREDNISONE 10 MG (21) PO TBPK: ORAL_TABLET | ORAL | 0 refills | Status: DC

## 2017-04-01 NOTE — Telephone Encounter (Signed)
Pt stated he got the rx for cough but he would like to try prednisone to see if it will help. Please advise.

## 2017-04-01 NOTE — Telephone Encounter (Signed)
It is not unusual for cough and chest congestion to linger after completion of oral abx. This does not mean oral abx was ineffective. With improvement in symptoms and absence of any new symtpoms, only symptoms are treated at this time. Use of delsym or robitussin Dm OTC for cough and/or prescription cough medication and short course of oral prednisone. Let me know if he was prescriptions sent.

## 2017-04-01 NOTE — Telephone Encounter (Signed)
Pt was seem on 8/20 for acute bronchitis, he states the ZPAK helped but seems to not hsve gotten it all, he would like a refill or something else called in please advise

## 2017-04-01 NOTE — Telephone Encounter (Signed)
Spoke with pt, he said he finish the abx Friday but still has some dry cough and chest congestion that has not gone away. He is concern because he finish his cancer treatment 2 wks before and his WBC is low. Please advise, another abx?

## 2017-04-02 ENCOUNTER — Ambulatory Visit (HOSPITAL_BASED_OUTPATIENT_CLINIC_OR_DEPARTMENT_OTHER): Payer: Medicare Other

## 2017-04-02 ENCOUNTER — Other Ambulatory Visit: Payer: Medicare Other

## 2017-04-02 ENCOUNTER — Other Ambulatory Visit (HOSPITAL_BASED_OUTPATIENT_CLINIC_OR_DEPARTMENT_OTHER): Payer: Medicare Other

## 2017-04-02 ENCOUNTER — Encounter: Payer: Self-pay | Admitting: Internal Medicine

## 2017-04-02 VITALS — BP 120/69 | HR 78 | Temp 98.6°F | Resp 18

## 2017-04-02 DIAGNOSIS — C7A8 Other malignant neuroendocrine tumors: Secondary | ICD-10-CM

## 2017-04-02 DIAGNOSIS — C7B8 Other secondary neuroendocrine tumors: Secondary | ICD-10-CM | POA: Diagnosis not present

## 2017-04-02 DIAGNOSIS — D3A8 Other benign neuroendocrine tumors: Secondary | ICD-10-CM

## 2017-04-02 LAB — COMPREHENSIVE METABOLIC PANEL
ALT: 23 U/L (ref 0–55)
ANION GAP: 7 meq/L (ref 3–11)
AST: 24 U/L (ref 5–34)
Albumin: 3.9 g/dL (ref 3.5–5.0)
Alkaline Phosphatase: 68 U/L (ref 40–150)
BUN: 19.5 mg/dL (ref 7.0–26.0)
CALCIUM: 9.8 mg/dL (ref 8.4–10.4)
CHLORIDE: 102 meq/L (ref 98–109)
CO2: 30 meq/L — AB (ref 22–29)
Creatinine: 1 mg/dL (ref 0.7–1.3)
EGFR: 79 mL/min/{1.73_m2} — ABNORMAL LOW (ref >90)
Glucose: 100 mg/dl (ref 70–140)
POTASSIUM: 4.7 meq/L (ref 3.5–5.1)
Sodium: 139 mEq/L (ref 136–145)
Total Bilirubin: 1.09 mg/dL (ref 0.20–1.20)
Total Protein: 6.9 g/dL (ref 6.4–8.3)

## 2017-04-02 LAB — CBC WITH DIFFERENTIAL/PLATELET
BASO%: 0.5 % (ref 0.0–2.0)
Basophils Absolute: 0 10*3/uL (ref 0.0–0.1)
EOS ABS: 0.1 10*3/uL (ref 0.0–0.5)
EOS%: 0.8 % (ref 0.0–7.0)
HEMATOCRIT: 46 % (ref 38.4–49.9)
HEMOGLOBIN: 15.7 g/dL (ref 13.0–17.1)
LYMPH%: 5.5 % — ABNORMAL LOW (ref 14.0–49.0)
MCH: 32 pg (ref 27.2–33.4)
MCHC: 34.1 g/dL (ref 32.0–36.0)
MCV: 94.1 fL (ref 79.3–98.0)
MONO#: 0.8 10*3/uL (ref 0.1–0.9)
MONO%: 10 % (ref 0.0–14.0)
NEUT%: 83.2 % — ABNORMAL HIGH (ref 39.0–75.0)
NEUTROS ABS: 6.7 10*3/uL — AB (ref 1.5–6.5)
Platelets: 146 10*3/uL (ref 140–400)
RBC: 4.89 10*6/uL (ref 4.20–5.82)
RDW: 12.6 % (ref 11.0–14.6)
WBC: 8 10*3/uL (ref 4.0–10.3)
lymph#: 0.4 10*3/uL — ABNORMAL LOW (ref 0.9–3.3)

## 2017-04-02 MED ORDER — OCTREOTIDE ACETATE 30 MG IM KIT
30.0000 mg | PACK | Freq: Once | INTRAMUSCULAR | Status: AC
  Administered 2017-04-02: 30 mg via INTRAMUSCULAR
  Filled 2017-04-02: qty 1

## 2017-04-18 NOTE — Addendum Note (Signed)
Encounter addended by: Jewel Baize, RD on: 04/18/2017  9:30 AM<BR>    Actions taken: Flowsheet data copied forward, Visit Navigator Flowsheet section accepted

## 2017-04-18 NOTE — Addendum Note (Signed)
Encounter addended by: Sol Passer on: 04/18/2017  8:08 AM<BR>    Actions taken: Flowsheet data copied forward, Flowsheet accepted

## 2017-04-23 ENCOUNTER — Ambulatory Visit (INDEPENDENT_AMBULATORY_CARE_PROVIDER_SITE_OTHER): Payer: Medicare Other | Admitting: Cardiovascular Disease

## 2017-04-23 ENCOUNTER — Encounter: Payer: Self-pay | Admitting: Cardiovascular Disease

## 2017-04-23 ENCOUNTER — Ambulatory Visit (HOSPITAL_COMMUNITY): Payer: Medicare Other | Attending: Cardiovascular Disease

## 2017-04-23 VITALS — BP 132/80 | HR 72 | Ht 70.0 in | Wt 182.4 lb

## 2017-04-23 DIAGNOSIS — Z952 Presence of prosthetic heart valve: Secondary | ICD-10-CM

## 2017-04-23 DIAGNOSIS — I10 Essential (primary) hypertension: Secondary | ICD-10-CM | POA: Diagnosis not present

## 2017-04-23 DIAGNOSIS — I350 Nonrheumatic aortic (valve) stenosis: Secondary | ICD-10-CM

## 2017-04-23 DIAGNOSIS — E785 Hyperlipidemia, unspecified: Secondary | ICD-10-CM | POA: Diagnosis not present

## 2017-04-23 MED ORDER — SILDENAFIL CITRATE 20 MG PO TABS: 20.0000 mg | ORAL_TABLET | Freq: Every day | ORAL | 3 refills | Status: DC | PRN

## 2017-04-23 NOTE — Patient Instructions (Signed)
Medication Instructions:  1) STOP METOPROLOL 2) START SILDENAFIL 20 mg (2-5 tablets) as needed  Labwork: None  Testing/Procedures: None  Follow-Up: Your provider wants you to follow-up in: 1 year with Dr. Acie Fredrickson. You will receive a reminder letter in the mail two months in advance. If you don't receive a letter, please call our office to schedule the follow-up appointment.    Your provider recommends that you schedule a follow-up appointment AS NEEDED with Dr. Burt Knack.  Any Other Special Instructions Will Be Listed Below (If Applicable).     If you need a refill on your cardiac medications before your next appointment, please call your pharmacy.

## 2017-04-23 NOTE — Progress Notes (Signed)
Cardiology Office Note Date:  04/25/2017   ID:  Ronald Thompson, DOB 09-Nov-1955, MRN [id number]  PCP:  [name], MD  Cardiologist:  [name], MD    Chief Complaint  Patient presents with  . Follow-up    one year TAVR follow-up     History of Present Illness: Ronald Thompson is a 61 y.o. male who presents for follow-up evaluation after undergoing TAVR 04/16/2016. This is his scheduled one-year Valve Clinic visit.   He has a complex medical history with metastatic pancreatic neuroendocrine tumor, remote history of chest seminoma treated with chest radiation, and severe aortic stenosis. He underwent TAVR one year ago with a 26 mm Sapien 3 valve, with an uncomplicated post-operative course.   The patient is here alone today. He continues to undergo treatment for his neuroendocrine tumor with Dr [name] and also is undergoing treatment with Dr Leamon Arnt at [facility] He reports stable symptoms of mild exertional dyspnea, but overall no significant change since his last visit here. He denies leg swelling, orthopnea, PND, or chest pain. He complains of generalized fatigue. No other specific cardiac-related complaints. He walks for exercise on a regular basis.    Past Medical History:  Diagnosis Date  . Baker's cyst   . Gallstones   . Heart murmur   . Hypercholesteremia   . Hypertension   . Lesion of left lung    hx.25 yrs ago- testicilar cancer related- only scarring left after tx.-no problems now  . Liver metastasis ([place])   . 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  . Pericarditis    2nd to tumor  . Pneumonia 1990  . Primary neuroendocrine tumor of pancreas 02/14/2014   Stage IV with multiple mets to liver  . Pulmonary fibrosis ([place]) 11/08/2015  . S/P TAVR (transcatheter aortic valve replacement) 04/16/2016   26 mm Edwards Sapien 3 transcatheter heart valve placed via percutaneous right transfemoral  approach   . Shortness of breath dyspnea    with exertion and when tired  . 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 Transfusion history    hx. 25 yrs ago-during cancer tx.    Past Surgical History:  Procedure Laterality Date  . BACK SURGERY     '14- rupt. disc   . CARDIAC CATHETERIZATION N/A 01/24/2016   Procedure: Right/Left Heart Cath and Coronary Angiography;  Surgeon: [name], MD;  Location: [place] [facility];  Service: Cardiovascular;  Laterality: N/A;  . COLONOSCOPY    . EUS N/A 02/10/2014   Procedure: UPPER ENDOSCOPIC ULTRASOUND (EUS) LINEAR;  Surgeon: [name], MD;  Location: [facility];  Service: Endoscopy;  Laterality: N/A;  . IR RADIOLOGIST EVAL & MGMT  02/11/2017  . KNEE SURGERY Right    age 16 for Baker's cyst  . NASAL SEPTUM SURGERY     Deviated septrum  . TEE WITHOUT CARDIOVERSION N/A 04/16/2016   Procedure: TRANSESOPHAGEAL ECHOCARDIOGRAM (TEE);  Surgeon: [name], MD;  Location: [place];  Service: Open Heart Surgery;  Laterality: N/A;  . THORACOTOMY  1990   wedge biopsy of mediastinal mass  . TRANSCATHETER AORTIC VALVE REPLACEMENT, TRANSFEMORAL N/A 04/16/2016   Procedure: TRANSCATHETER AORTIC VALVE REPLACEMENT, TRANSFEMORAL;  Surgeon: [name], MD;  Location: [facility];  Service: Open Heart Surgery;  Laterality: N/A;    Current Outpatient Prescriptions  Medication Sig Dispense Refill  .  acetaminophen (TYLENOL) 500 MG tablet Take 1,000 mg by mouth every 6 (six) hours as needed for headache.    Marland Kitchen aspirin EC 81 MG tablet Take 81 mg by mouth every evening.    . calcium carbonate (TUMS - DOSED IN MG ELEMENTAL CALCIUM) 500 MG chewable tablet Chew 3 tablets by mouth 3 (three) times daily as needed for indigestion or heartburn.     . clindamycin (CLEOCIN) 300 MG capsule Take 2 capsules by mouth one hour prior to dental appointment 6 capsule 2  . docusate sodium (COLACE) 100 MG  capsule Take 100 mg by mouth daily as needed for mild constipation.    Marland Kitchen ibuprofen (ADVIL,MOTRIN) 200 MG tablet Take 400 mg by mouth every 6 (six) hours as needed for headache or mild pain.     Marland Kitchen losartan (COZAAR) 50 MG tablet Take 1 tablet (50 mg total) by mouth daily. 90 tablet 3  . Octreotide Acetate (SANDOSTATIN IJ) Inject 1 each as directed every 30 (thirty) days.     . ondansetron (ZOFRAN) 8 MG tablet Take 1 tablet (8 mg total) by mouth 2 (two) times daily as needed for nausea or vomiting. 20 tablet 0  . oxyCODONE (OXY IR/ROXICODONE) 5 MG immediate release tablet 1-2 tabs PO Q 6 hours PRN pain. 45 tablet 0  . sennosides-docusate sodium (SENOKOT-S) 8.6-50 MG tablet Take 1 tablet by mouth as needed for constipation.    . simvastatin (ZOCOR) 20 MG tablet TAKE ONE TABLET BY MOUTH DAILY AT 6 PM 90 tablet 3  . albuterol (PROVENTIL HFA;VENTOLIN HFA) 108 (90 Base) MCG/ACT inhaler Inhale 1-2 puffs into the lungs every 6 (six) hours as needed for wheezing or shortness of breath. (Patient not taking: Reported on 04/23/2017) 1 Inhaler 0  . promethazine-codeine (PHENERGAN WITH CODEINE) 6.25-10 MG/5ML syrup Take 5 mLs by mouth every 6 (six) hours as needed. (Patient not taking: Reported on 04/23/2017) 180 mL 0  . sildenafil (REVATIO) 20 MG tablet Take 1 tablet (20 mg total) by mouth daily as needed. Take 2-5 tablets as needed. 60 tablet 3   No current facility-administered medications for this visit.     Allergies:   Penicillins   Social History:  The patient  reports that he has never smoked. He has never used smokeless tobacco. He reports that he drinks alcohol. He reports that he does not use drugs.   Family History:  The patient's  family history includes Cancer in his maternal aunt and paternal grandfather; Dementia in his mother; Emphysema in his maternal aunt.    ROS:  Please see the history of present illness.   All other systems are reviewed and negative.    PHYSICAL EXAM: VS:  BP 132/80    Pulse 72   Ht _0  (1.778 m)   Wt 82.7 kg (182 lb 6.4 oz)   SpO2 95%   BMI 26.17 kg/m  , BMI Body mass index is 26.17 kg/m. GEN: Well nourished, well developed, in no acute distress  HEENT: normal  Neck: no JVD, no masses.  Cardiac: RRR with 2/6 systolic murmur at the RUSB, tamboric A2, 2/6 diastolic murmur at the LUSB             Respiratory:  clear to auscultation bilaterally, normal work of breathing GI: soft, nontender, nondistended, + BS MS: no deformity or atrophy  Ext: no pretibial edema, pedal pulses 2+= bilaterally Skin: warm and dry, no rash Neuro:  Strength and sensation are intact Psych: euthymic mood, full affect  EKG:  EKG is ordered today. The ekg ordered today shows NSR 70 bpm, biatrial enlargement, nonspecific ST abnormality  Recent Labs: 01/08/2017: TSH 0.97 04/02/2017: ALT 23; BUN 19.5; Creatinine 1.0; HGB 15.7; Platelets 146; Potassium 4.7; Sodium 139   Lipid Panel     Component Value Date/Time   CHOL 131 01/08/2017 1012   TRIG 56.0 01/08/2017 1012   HDL 48.80 01/08/2017 1012   CHOLHDL 3 01/08/2017 1012   VLDL 11.2 01/08/2017 1012   LDLCALC 71 01/08/2017 1012   LDLDIRECT 139.2 10/10/2008 0800      Wt Readings from Last 3 Encounters:  04/23/17 82.7 kg (182 lb 6.4 oz)  03/24/17 81.6 kg (180 lb)  03/24/17 81.9 kg (180 lb 8.9 oz)     Cardiac Studies Reviewed: Echo: Study Conclusions  - Aortic valve: Post TAVR with 26 mm Sapien 3 trivial peri valvular   regurgitation and stable gradients. - Mitral valve: Severe MAC. Moderately to severely calcified,   severely fibrotic, severely thickened annulus. There was mild   regurgitation. - Right ventricle: The cavity size was severely dilated. - Right atrium: The atrium was severely dilated. - Atrial septum: No defect or patent foramen ovale was identified. - Pulmonic valve: There is unusual turbulent flow in the RVOT seen   on basal short axis images that may represent severe PR and some   degree of  PS   Suggest TEE to further evaluate if clinically indicated. - Pulmonary arteries: PA peak pressure: 63 mm Hg (S).  ------------------------------------------------------------------- Study data:  Comparison was made to the study of 05/13/2016.  Study status:  Routine.  Procedure:  The patient reported no pain pre or post test. Transthoracic echocardiography for left ventricular function evaluation and for assessment of valvular function. Image quality was adequate.  Study completion:  There were no complications.          Transthoracic echocardiography.  M-mode, complete 2D, spectral Doppler, and color Doppler.  Birthdate: Patient birthdate: [birth year].  Age:  Patient is 61 yr old.  Sex: Gender: male.    BMI: 26.2 kg/m^2.  Blood pressure:     126/80 Patient status:  Outpatient.  Study date:  Study date: 04/23/2017. Study time: 10:59 AM.  Location:  [place] [facility]  -------------------------------------------------------------------  ------------------------------------------------------------------- Aortic valve:  Post TAVR with 26 mm Sapien 3 trivial peri valvular regurgitation and stable gradients.  Doppler:     VTI ratio of LVOT to aortic valve: 0.53. Valve area (VTI): 1.35 cm^2. Indexed valve area (VTI): 0.67 cm^2/m^2. Peak velocity ratio of LVOT to aortic valve: 0.54. Valve area (Vmax): 1.37 cm^2. Indexed valve area (Vmax): 0.68 cm^2/m^2. Mean velocity ratio of LVOT to aortic valve: 0.56. Valve area (Vmean): 1.41 cm^2. Indexed valve area (Vmean): 0.7 cm^2/m^2.    Mean gradient (S): 5 mm Hg. Peak gradient (S): 12 mm Hg.  ------------------------------------------------------------------- Aorta:  The aorta was normal, not dilated, and non-diseased.  ------------------------------------------------------------------- Mitral valve:  Severe MAC.  Moderately to severely calcified, severely fibrotic, severely thickened annulus.  Doppler:  There was mild regurgitation.     Peak gradient (D): 8 mm Hg.  ------------------------------------------------------------------- Left atrium:  The atrium was normal in size.  ------------------------------------------------------------------- Atrial septum:  No defect or patent foramen ovale was identified.   ------------------------------------------------------------------- Right ventricle:  The cavity size was severely dilated.  ------------------------------------------------------------------- Pulmonic valve:   There is unusual turbulent flow in the RVOT seen on basal short axis images that may represent severe PR and some degree of PS Suggest  TEE to further evaluate if clinically indicated.  Doppler: There was mild regurgitation.  ------------------------------------------------------------------- Tricuspid valve:   Doppler:  There was mild regurgitation.  ------------------------------------------------------------------- Right atrium:  The atrium was severely dilated.  ------------------------------------------------------------------- Pericardium:  The pericardium was normal in appearance.  ------------------------------------------------------------------- Systemic veins: Inferior vena cava: The vessel was dilated. The respirophasic diameter changes were blunted (< 50%), consistent with elevated central venous pressure.  ------------------------------------------------------------------- Post procedure conclusions Ascending Aorta:  - The aorta was normal, not dilated, and non-diseased.  ASSESSMENT AND PLAN: 1.  Aortic valve disease, now one year out from TAVR with a 26 mm Sapien 3 valve 2. Chronic diastolic heart failure with NYHA functional Class 2 symptoms 3. Severe pulmonic regurgitation with severely dilated RA/RV and severe pulmonary HTN (RV systolic pressure estimated at 63 mmHg) 4. HTN, controlled  The patient's echo is personally reviewed. His transcatheter aortic valve is  functioning normally with a low gradient and trivial paravalvular regurgitation. His functional class is stable with NYHA 2 symptoms. I am concerned about progressive changes of RV dilatation and dysfunction. He has had mild pulmonic regurgitation in the past, but this now appears to be severe. Right-sided heart valve disease is associated with neuroendocrine carcinoma and this is a possible etiology of worsening pulmonic insufficiency in this patient. Considering the complexity of his cardiac and pulmonary disease with radiation changes and extensive calcification through the aortic/mitral window, mitral annulus, and even pericardial calcification, I suspect his treatment options are quite limited. It might be reasonable to consider TEE/cardiac MRI to better define his right heart and pulmonic valve disease, but again I'm not sure this will impact his overall treatment. I will discuss with the multidisciplinary heart valve team and then will be back in touch with the patient.   Today I advised Mr Cartlidge to discontinue metoprolol. He will continue his other medications without change.   Current medicines are reviewed with the patient today.  The patient does not have concerns regarding medicines.  Labs/ tests ordered today include:   Orders Placed This Encounter  Procedures  . EKG 12-Lead    Disposition:   FU pending further review with the multidisciplinary heart team  Signed, [name], MD  04/25/2017 8:27 AM    [address], [place], [place]  [address] Phone: [phone]; Fax: [phone]

## 2017-04-25 ENCOUNTER — Encounter: Payer: Self-pay | Admitting: Thoracic Surgery (Cardiothoracic Vascular Surgery)

## 2017-04-28 ENCOUNTER — Other Ambulatory Visit (HOSPITAL_BASED_OUTPATIENT_CLINIC_OR_DEPARTMENT_OTHER): Payer: Medicare Other

## 2017-04-28 ENCOUNTER — Encounter: Payer: Self-pay | Admitting: Internal Medicine

## 2017-04-28 DIAGNOSIS — C7A8 Other malignant neuroendocrine tumors: Secondary | ICD-10-CM | POA: Diagnosis present

## 2017-04-28 DIAGNOSIS — C7B8 Other secondary neuroendocrine tumors: Secondary | ICD-10-CM

## 2017-04-28 DIAGNOSIS — D3A8 Other benign neuroendocrine tumors: Secondary | ICD-10-CM

## 2017-04-28 LAB — CBC WITH DIFFERENTIAL/PLATELET
BASO%: 0.2 % (ref 0.0–2.0)
Basophils Absolute: 0 10*3/uL (ref 0.0–0.1)
EOS%: 1.5 % (ref 0.0–7.0)
Eosinophils Absolute: 0.1 10*3/uL (ref 0.0–0.5)
HEMATOCRIT: 43.5 % (ref 38.4–49.9)
HEMOGLOBIN: 14.8 g/dL (ref 13.0–17.1)
LYMPH#: 0.5 10*3/uL — AB (ref 0.9–3.3)
LYMPH%: 8.6 % — ABNORMAL LOW (ref 14.0–49.0)
MCH: 32 pg (ref 27.2–33.4)
MCHC: 34 g/dL (ref 32.0–36.0)
MCV: 94.2 fL (ref 79.3–98.0)
MONO#: 0.5 10*3/uL (ref 0.1–0.9)
MONO%: 8.6 % (ref 0.0–14.0)
NEUT%: 81.1 % — AB (ref 39.0–75.0)
NEUTROS ABS: 4.4 10*3/uL (ref 1.5–6.5)
PLATELETS: 124 10*3/uL — AB (ref 140–400)
RBC: 4.62 10*6/uL (ref 4.20–5.82)
RDW: 12.7 % (ref 11.0–14.6)
WBC: 5.4 10*3/uL (ref 4.0–10.3)

## 2017-04-28 LAB — COMPREHENSIVE METABOLIC PANEL
ALBUMIN: 4.1 g/dL (ref 3.5–5.0)
ALK PHOS: 58 U/L (ref 40–150)
ALT: 21 U/L (ref 0–55)
ANION GAP: 6 meq/L (ref 3–11)
AST: 26 U/L (ref 5–34)
BILIRUBIN TOTAL: 1.17 mg/dL (ref 0.20–1.20)
BUN: 21.9 mg/dL (ref 7.0–26.0)
CO2: 32 meq/L — AB (ref 22–29)
Calcium: 9.5 mg/dL (ref 8.4–10.4)
Chloride: 103 mEq/L (ref 98–109)
Creatinine: 0.9 mg/dL (ref 0.7–1.3)
GLUCOSE: 79 mg/dL (ref 70–140)
POTASSIUM: 4.1 meq/L (ref 3.5–5.1)
SODIUM: 141 meq/L (ref 136–145)
Total Protein: 6.7 g/dL (ref 6.4–8.3)

## 2017-04-30 ENCOUNTER — Other Ambulatory Visit: Payer: Medicare Other

## 2017-04-30 ENCOUNTER — Ambulatory Visit (HOSPITAL_BASED_OUTPATIENT_CLINIC_OR_DEPARTMENT_OTHER): Payer: Medicare Other

## 2017-04-30 ENCOUNTER — Ambulatory Visit (HOSPITAL_COMMUNITY)
Admission: RE | Admit: 2017-04-30 | Discharge: 2017-04-30 | Disposition: A | Discharge status: Home / Self Care | Payer: Medicare Other | Source: Ambulatory Visit | Attending: Diagnostic Radiology | Admitting: Diagnostic Radiology

## 2017-04-30 VITALS — BP 126/81 | HR 63 | Temp 98.3°F | Resp 16

## 2017-04-30 DIAGNOSIS — C7A8 Other malignant neuroendocrine tumors: Secondary | ICD-10-CM | POA: Diagnosis not present

## 2017-04-30 DIAGNOSIS — C7951 Secondary malignant neoplasm of bone: Secondary | ICD-10-CM | POA: Diagnosis not present

## 2017-04-30 DIAGNOSIS — C7B8 Other secondary neuroendocrine tumors: Secondary | ICD-10-CM

## 2017-04-30 DIAGNOSIS — C787 Secondary malignant neoplasm of liver and intrahepatic bile duct: Secondary | ICD-10-CM | POA: Diagnosis not present

## 2017-04-30 DIAGNOSIS — D3A8 Other benign neuroendocrine tumors: Secondary | ICD-10-CM

## 2017-04-30 MED ORDER — SODIUM CHLORIDE 0.9 % IV SOLN
8.0000 mg | Freq: Once | INTRAVENOUS | Status: AC
  Administered 2017-04-30: 8 mg via INTRAVENOUS
  Filled 2017-04-30: qty 4

## 2017-04-30 MED ORDER — AMINO ACID RADIOPROTECTANT - L-LYSINE 2.5%/L-ARGININE 2.5% IN NS
250.0000 mL/h | INTRAVENOUS | Status: AC
  Administered 2017-04-30: 250 mL/h via INTRAVENOUS
  Filled 2017-04-30: qty 1000

## 2017-04-30 MED ORDER — ONDANSETRON HCL 8 MG PO TABS: 8.0000 mg | ORAL_TABLET | Freq: Two times a day (BID) | ORAL | 0 refills | Status: DC | PRN

## 2017-04-30 MED ORDER — OCTREOTIDE ACETATE 30 MG IM KIT
30.0000 mg | PACK | Freq: Once | INTRAMUSCULAR | Status: AC
  Administered 2017-04-30: 30 mg via INTRAMUSCULAR
  Filled 2017-04-30: qty 1

## 2017-04-30 MED ORDER — LUTETIUM LU 177 DOTATATE 370 MBQ/ML IV SOLN
200.0000 | Freq: Once | INTRAVENOUS | Status: AC
  Administered 2017-04-30: 201.5 via INTRAVENOUS

## 2017-04-30 MED ORDER — SODIUM CHLORIDE 0.9 % IV SOLN: 500.0000 mL | Freq: Once | INTRAVENOUS | Status: DC

## 2017-04-30 NOTE — Progress Notes (Signed)
Patient finished with treatment with no complications. VSS stable the entire treatment.  IV's removed and discharge instructions given to patient.

## 2017-04-30 NOTE — Progress Notes (Signed)
Patient admitted to Nuclear Med unit for Lutatthera treatment. Patient A&O x 4, VSS (see flowsheets). IV's placed and patient resting comfortably. Will continue to monitor.

## 2017-04-30 NOTE — Progress Notes (Signed)
Patient ID: Ronald Thompson, male   DOB: 1955/11/07, 61 y.o.   MRN: [id number] Patient returns for second Lutathera PRRT therapy 8 weeks following previous therapy. Patient tolerated procedure well with only mild fatigue for several days after the first therapy. Patient's hematological labs, liver function panel and renal function panel are  within normal limits and adequate for full-dose PRRT.   Patient tolerated the amino acid infusion and radiotherapy infusion well with minimal nausea and no vomiting. Patient will return for follow-up laboratory values and assessment at cancer center on  10 /05/2017.  Patient will  returned for 3rd PRRT Lutathera therapy in 8 to 10 week (07/02/17)  Patient advise to contact radiology with any concerns or questions.

## 2017-05-14 ENCOUNTER — Telehealth: Payer: Self-pay | Admitting: Oncology

## 2017-05-14 ENCOUNTER — Encounter: Payer: Self-pay | Admitting: Internal Medicine

## 2017-05-14 ENCOUNTER — Other Ambulatory Visit (HOSPITAL_BASED_OUTPATIENT_CLINIC_OR_DEPARTMENT_OTHER): Payer: Medicare Other

## 2017-05-14 ENCOUNTER — Ambulatory Visit (HOSPITAL_BASED_OUTPATIENT_CLINIC_OR_DEPARTMENT_OTHER): Payer: Medicare Other | Admitting: Nurse Practitioner

## 2017-05-14 VITALS — BP 147/80 | HR 74 | Temp 97.6°F | Resp 18 | Ht 70.0 in | Wt 181.6 lb

## 2017-05-14 DIAGNOSIS — C7B8 Other secondary neuroendocrine tumors: Secondary | ICD-10-CM | POA: Diagnosis not present

## 2017-05-14 DIAGNOSIS — C7A8 Other malignant neuroendocrine tumors: Secondary | ICD-10-CM

## 2017-05-14 DIAGNOSIS — Z23 Encounter for immunization: Secondary | ICD-10-CM | POA: Diagnosis present

## 2017-05-14 DIAGNOSIS — D3A8 Other benign neuroendocrine tumors: Secondary | ICD-10-CM

## 2017-05-14 LAB — CBC WITH DIFFERENTIAL/PLATELET
BASO%: 0.2 % (ref 0.0–2.0)
Basophils Absolute: 0 10*3/uL (ref 0.0–0.1)
EOS ABS: 0.1 10*3/uL (ref 0.0–0.5)
EOS%: 1.4 % (ref 0.0–7.0)
HCT: 45.9 % (ref 38.4–49.9)
HEMOGLOBIN: 15.7 g/dL (ref 13.0–17.1)
LYMPH%: 11.8 % — ABNORMAL LOW (ref 14.0–49.0)
MCH: 32.3 pg (ref 27.2–33.4)
MCHC: 34.2 g/dL (ref 32.0–36.0)
MCV: 94.4 fL (ref 79.3–98.0)
MONO#: 0.3 10*3/uL (ref 0.1–0.9)
MONO%: 4.7 % (ref 0.0–14.0)
NEUT%: 81.9 % — ABNORMAL HIGH (ref 39.0–75.0)
NEUTROS ABS: 4.9 10*3/uL (ref 1.5–6.5)
PLATELETS: 144 10*3/uL (ref 140–400)
RBC: 4.86 10*6/uL (ref 4.20–5.82)
RDW: 12.8 % (ref 11.0–14.6)
WBC: 5.9 10*3/uL (ref 4.0–10.3)
lymph#: 0.7 10*3/uL — ABNORMAL LOW (ref 0.9–3.3)

## 2017-05-14 MED ORDER — INFLUENZA VAC SPLIT QUAD 0.5 ML IM SUSY
0.5000 mL | PREFILLED_SYRINGE | Freq: Once | INTRAMUSCULAR | Status: AC
  Administered 2017-05-14: 0.5 mL via INTRAMUSCULAR
  Filled 2017-05-14: qty 0.5

## 2017-05-14 NOTE — Progress Notes (Addendum)
  [place] OFFICE PROGRESS NOTE   Diagnosis:  Pancreatic neuroendocrine tumor  INTERVAL HISTORY:   Ronald Thompson returns as scheduled. He completed cycle 2 Lutathera 04/30/2017. He noted fatigue and nausea lasting about 4-5 days. He has since resumed normal activities and feels well. He denies diarrhea. No rash. No pain. No fever.  Objective:  Vital signs in last 24 hours:  Blood pressure (!) 147/80, pulse 74, temperature 97.6 F (36.4 C), temperature source Oral, resp. rate 18, height 5\' 10"  (1.778 m), weight 181 lb 9.6 oz (82.4 kg), SpO2 97 %.    HEENT: No thrush or ulcers. Lymphatics: No palpable cervical or supra-clavicular lymph nodes. Resp: Lungs clear bilaterally. Breath sounds diminished at the left lower lung field. No respiratory distress. Cardio: Regular rate and rhythm. GI: Abdomen soft and nontender. No hepatomegaly. Vascular: No leg edema.   Lab Results:  Lab Results  Component Value Date   WBC 5.9 05/14/2017   HGB 15.7 05/14/2017   HCT 45.9 05/14/2017   MCV 94.4 05/14/2017   PLT 144 05/14/2017   NEUTROABS 4.9 05/14/2017    Imaging:  No results found.  Medications: I have reviewed the patient's current medications.  Assessment/Plan: 1. Pancreatic neuroendocrine tumor, WHO grade 2, pancreatic head mass and small peripancreatic/celiac nodes on a CT 02/04/2014  EUS revealed evidence of multiple liver metastases-status post an FNA biopsy of a left liver lesion 02/10/2014 confirming a neuroendocrine tumor   Octreotide scan 04/01/2014 with multiple foci of metastatic neuroendocrine tumor in the liver  Monthly Sandostatin started 03/16/2014  Restaging CT 07/04/2014 with resolution of a previously noted pancreas head lesion and probable progression of liver metastases  Restaging CT 10/31/2014-stable liver lesions, no evidence of a pancreas mass, stable.  Restaging CT 02/28/2015-stable hepatic metastases, stable pancreas lesion  unchanged  Restaging CT 08/30/2015-stable pancreas mass, slight enlargement of hepatic metastases  Monthly Sandostatin continued  Restaging CTs06/14/2017 revealed enlargement of liver lesions, no new lesions, enlargement of the pancreas head mass  Gallium DOTATATEscan 10/01/2016 confirmed uptake in the liver metastases, pancreas primary, peripancreatic adenopathy, and left iliac bone  Cycle 1 Lutathera 03/05/2017  Cycle 2 Lutathera 04/30/2017  2. Chest Seminoma in 1990 treated with BEP and chest radiation at [facility] 3. 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 chronic exertional dyspnea following treatment for the seminoma  5. aortic stenosis on an echocardiogram December 2011, severe aortic stenosis on echocardiogram 12/06/2015, status postTAVR on 04/16/2016 6. lumbar disc surgery 2014  7. acute abdominal pain 02/04/2014-resolved     Disposition: Ronald Thompson appears stable. He has completed 2 cycles of Lutathera and overall seems to be tolerating well. He will return for the next Sandostatin injection 05/28/2017. He will come in for labs 06/30/2017. Cycle 3 Lutathera is scheduled 07/02/2017. He will be scheduled for Sandostatin that day. He will return for labs, follow-up visit and Sandostatin on 07/30/2017. He will contact the office in the interim with any problems.  Patient seen with Dr. [name].    Ned Card ANP/GNP-BC   05/14/2017  11:22 AM This was a shared visit with Ned Card. Ronald Thompson is tolerating the Lutathera L. The plan is to proceed with a third treatment on 07/02/2017.  Dr. Leonia Reeves will direct the plan for restaging imaging studies.  Ronald Thompson, M.D.

## 2017-05-14 NOTE — Telephone Encounter (Signed)
Gave patient avs report and appointments for October thru December  °

## 2017-05-28 ENCOUNTER — Ambulatory Visit (HOSPITAL_BASED_OUTPATIENT_CLINIC_OR_DEPARTMENT_OTHER): Payer: Medicare Other

## 2017-05-28 VITALS — BP 136/65 | HR 90 | Temp 97.8°F | Resp 20

## 2017-05-28 DIAGNOSIS — C7B8 Other secondary neuroendocrine tumors: Secondary | ICD-10-CM

## 2017-05-28 DIAGNOSIS — D3A8 Other benign neuroendocrine tumors: Secondary | ICD-10-CM

## 2017-05-28 DIAGNOSIS — C7A8 Other malignant neuroendocrine tumors: Secondary | ICD-10-CM

## 2017-05-28 MED ORDER — OCTREOTIDE ACETATE 30 MG IM KIT
30.0000 mg | PACK | Freq: Once | INTRAMUSCULAR | Status: AC
  Administered 2017-05-28: 30 mg via INTRAMUSCULAR
  Filled 2017-05-28: qty 1

## 2017-05-28 NOTE — Patient Instructions (Signed)

## 2017-06-30 ENCOUNTER — Other Ambulatory Visit: Payer: Self-pay | Admitting: Emergency Medicine

## 2017-06-30 ENCOUNTER — Other Ambulatory Visit (HOSPITAL_BASED_OUTPATIENT_CLINIC_OR_DEPARTMENT_OTHER): Payer: Medicare Other

## 2017-06-30 DIAGNOSIS — C7B8 Other secondary neuroendocrine tumors: Secondary | ICD-10-CM | POA: Diagnosis not present

## 2017-06-30 DIAGNOSIS — C7A8 Other malignant neuroendocrine tumors: Secondary | ICD-10-CM

## 2017-06-30 DIAGNOSIS — D3A8 Other benign neuroendocrine tumors: Secondary | ICD-10-CM

## 2017-06-30 LAB — CBC WITH DIFFERENTIAL/PLATELET
BASO%: 0.2 % (ref 0.0–2.0)
BASOS ABS: 0 10*3/uL (ref 0.0–0.1)
EOS ABS: 0.1 10*3/uL (ref 0.0–0.5)
EOS%: 2.5 % (ref 0.0–7.0)
HCT: 44.8 % (ref 38.4–49.9)
HEMOGLOBIN: 15.6 g/dL (ref 13.0–17.1)
LYMPH%: 15.3 % (ref 14.0–49.0)
MCH: 32.9 pg (ref 27.2–33.4)
MCHC: 34.8 g/dL (ref 32.0–36.0)
MCV: 94.5 fL (ref 79.3–98.0)
MONO#: 0.1 10*3/uL (ref 0.1–0.9)
MONO%: 3.5 % (ref 0.0–14.0)
NEUT#: 3.2 10*3/uL (ref 1.5–6.5)
NEUT%: 78.5 % — AB (ref 39.0–75.0)
Platelets: 140 10*3/uL (ref 140–400)
RBC: 4.74 10*6/uL (ref 4.20–5.82)
RDW: 12.8 % (ref 11.0–14.6)
WBC: 4 10*3/uL (ref 4.0–10.3)
lymph#: 0.6 10*3/uL — ABNORMAL LOW (ref 0.9–3.3)

## 2017-06-30 LAB — COMPREHENSIVE METABOLIC PANEL
ALBUMIN: 4.1 g/dL (ref 3.5–5.0)
ALK PHOS: 61 U/L (ref 40–150)
ALT: 26 U/L (ref 0–55)
AST: 30 U/L (ref 5–34)
Anion Gap: 9 mEq/L (ref 3–11)
BUN: 19.4 mg/dL (ref 7.0–26.0)
CO2: 28 meq/L (ref 22–29)
Calcium: 9.5 mg/dL (ref 8.4–10.4)
Chloride: 102 mEq/L (ref 98–109)
Creatinine: 1 mg/dL (ref 0.7–1.3)
GLUCOSE: 106 mg/dL (ref 70–140)
POTASSIUM: 4.5 meq/L (ref 3.5–5.1)
SODIUM: 139 meq/L (ref 136–145)
Total Bilirubin: 1.23 mg/dL — ABNORMAL HIGH (ref 0.20–1.20)
Total Protein: 7.1 g/dL (ref 6.4–8.3)

## 2017-07-02 ENCOUNTER — Ambulatory Visit (HOSPITAL_BASED_OUTPATIENT_CLINIC_OR_DEPARTMENT_OTHER): Payer: Medicare Other

## 2017-07-02 ENCOUNTER — Encounter (HOSPITAL_COMMUNITY)
Admission: RE | Admit: 2017-07-02 | Discharge: 2017-07-02 | Disposition: A | Discharge status: Home / Self Care | Payer: Medicare Other | Source: Ambulatory Visit | Attending: Diagnostic Radiology | Admitting: Diagnostic Radiology

## 2017-07-02 ENCOUNTER — Other Ambulatory Visit: Payer: Self-pay | Admitting: Oncology

## 2017-07-02 ENCOUNTER — Telehealth: Payer: Self-pay

## 2017-07-02 DIAGNOSIS — D3A8 Other benign neuroendocrine tumors: Secondary | ICD-10-CM

## 2017-07-02 DIAGNOSIS — C7B8 Other secondary neuroendocrine tumors: Secondary | ICD-10-CM

## 2017-07-02 DIAGNOSIS — C7A8 Other malignant neuroendocrine tumors: Secondary | ICD-10-CM | POA: Diagnosis not present

## 2017-07-02 DIAGNOSIS — C254 Malignant neoplasm of endocrine pancreas: Secondary | ICD-10-CM | POA: Diagnosis not present

## 2017-07-02 MED ORDER — AMINO ACID RADIOPROTECTANT - L-LYSINE 2.5%/L-ARGININE 2.5% IN NS
250.0000 mL/h | INTRAVENOUS | Status: AC
  Administered 2017-07-02 (×2): 250 mL/h via INTRAVENOUS
  Filled 2017-07-02: qty 1000

## 2017-07-02 MED ORDER — OCTREOTIDE ACETATE 30 MG IM KIT
30.0000 mg | PACK | Freq: Once | INTRAMUSCULAR | Status: AC
  Administered 2017-07-02: 30 mg via INTRAMUSCULAR
  Filled 2017-07-02: qty 1

## 2017-07-02 MED ORDER — ONDANSETRON HCL 8 MG PO TABS: 8.0000 mg | ORAL_TABLET | Freq: Two times a day (BID) | ORAL | 0 refills | Status: DC | PRN

## 2017-07-02 MED ORDER — LUTETIUM LU 177 DOTATATE 370 MBQ/ML IV SOLN
200.0000 | Freq: Once | INTRAVENOUS | Status: AC
  Administered 2017-07-02: 208.8 via INTRAVENOUS

## 2017-07-02 MED ORDER — SODIUM CHLORIDE 0.9 % IV SOLN
8.0000 mg | Freq: Once | INTRAVENOUS | Status: AC
  Administered 2017-07-02: 8 mg via INTRAVENOUS
  Filled 2017-07-02: qty 4

## 2017-07-02 MED ORDER — SODIUM CHLORIDE 0.9 % IV SOLN: 500.0000 mL | Freq: Once | INTRAVENOUS | Status: DC

## 2017-07-02 MED ORDER — PROCHLORPERAZINE EDISYLATE 5 MG/ML IJ SOLN
10.0000 mg | INTRAMUSCULAR | Status: DC | PRN
  Filled 2017-07-02: qty 2

## 2017-07-02 NOTE — Progress Notes (Signed)
Patient ID: Ronald Thompson, male   DOB: [birth year], 61 y.o.   MRN: [id number]    Patient returns for THIRD Lutathera PRRT therapy 8 weeks following previous therapy. Patient tolerated procedure well with only mild fatigue for several days after the first therapy. Patient's hematological labs, liver function panel and renal function panel are  within normal limits and adequate for full-dose PRRT.    Patient tolerated the amino acid infusion and radiotherapy infusion well with minimal nausea and no vomiting. Mild infiltration into right arm.  IV AA switched to right arm.  Patient will return for follow-up laboratory values and assessment at cancer center in 4 weeks.  Patient will  returned for FINAL PRRT Lutathera therapy in 8 to 10 week  (06/27/18)   Patient advise to contact radiology with any concerns or questions.

## 2017-07-02 NOTE — Telephone Encounter (Signed)
Printed avs and calender for upcoming appointment. Per 11/28 walk up assist.

## 2017-07-02 NOTE — ED Notes (Signed)
ED Provider at bedside. 

## 2017-07-25 ENCOUNTER — Ambulatory Visit (HOSPITAL_BASED_OUTPATIENT_CLINIC_OR_DEPARTMENT_OTHER): Payer: Medicare Other

## 2017-07-25 ENCOUNTER — Other Ambulatory Visit (HOSPITAL_BASED_OUTPATIENT_CLINIC_OR_DEPARTMENT_OTHER): Payer: Medicare Other

## 2017-07-25 ENCOUNTER — Ambulatory Visit: Payer: Medicare Other

## 2017-07-25 ENCOUNTER — Ambulatory Visit (HOSPITAL_BASED_OUTPATIENT_CLINIC_OR_DEPARTMENT_OTHER): Payer: Medicare Other | Admitting: Oncology

## 2017-07-25 ENCOUNTER — Telehealth: Payer: Self-pay | Admitting: Oncology

## 2017-07-25 VITALS — BP 150/70 | HR 75 | Temp 97.6°F | Resp 18 | Ht 70.0 in | Wt 183.1 lb

## 2017-07-25 DIAGNOSIS — R5383 Other fatigue: Secondary | ICD-10-CM

## 2017-07-25 DIAGNOSIS — C7B8 Other secondary neuroendocrine tumors: Secondary | ICD-10-CM

## 2017-07-25 DIAGNOSIS — R0609 Other forms of dyspnea: Secondary | ICD-10-CM

## 2017-07-25 DIAGNOSIS — K5900 Constipation, unspecified: Secondary | ICD-10-CM | POA: Diagnosis not present

## 2017-07-25 DIAGNOSIS — D3A8 Other benign neuroendocrine tumors: Secondary | ICD-10-CM

## 2017-07-25 DIAGNOSIS — R110 Nausea: Secondary | ICD-10-CM

## 2017-07-25 DIAGNOSIS — C7A8 Other malignant neuroendocrine tumors: Secondary | ICD-10-CM

## 2017-07-25 LAB — COMPREHENSIVE METABOLIC PANEL
ALBUMIN: 4.2 g/dL (ref 3.5–5.0)
ALK PHOS: 60 U/L (ref 40–150)
ALT: 22 U/L (ref 0–55)
AST: 23 U/L (ref 5–34)
Anion Gap: 8 mEq/L (ref 3–11)
BILIRUBIN TOTAL: 0.83 mg/dL (ref 0.20–1.20)
BUN: 19.1 mg/dL (ref 7.0–26.0)
CALCIUM: 9.7 mg/dL (ref 8.4–10.4)
CO2: 31 mEq/L — ABNORMAL HIGH (ref 22–29)
CREATININE: 1 mg/dL (ref 0.7–1.3)
Chloride: 102 mEq/L (ref 98–109)
EGFR: >60 mL/min/{1.73_m2} (ref >60)
Glucose: 79 mg/dl (ref 70–140)
Potassium: 5.2 mEq/L — ABNORMAL HIGH (ref 3.5–5.1)
Sodium: 141 mEq/L (ref 136–145)
Total Protein: 6.9 g/dL (ref 6.4–8.3)

## 2017-07-25 LAB — CBC WITH DIFFERENTIAL/PLATELET
BASO%: 0.4 % (ref 0.0–2.0)
Basophils Absolute: 0 10*3/uL (ref 0.0–0.1)
EOS%: 1.5 % (ref 0.0–7.0)
Eosinophils Absolute: 0.1 10*3/uL (ref 0.0–0.5)
HCT: 45.9 % (ref 38.4–49.9)
HGB: 15.2 g/dL (ref 13.0–17.1)
LYMPH%: 4.4 % — AB (ref 14.0–49.0)
MCH: 32 pg (ref 27.2–33.4)
MCHC: 33.2 g/dL (ref 32.0–36.0)
MCV: 96.4 fL (ref 79.3–98.0)
MONO#: 0.7 10*3/uL (ref 0.1–0.9)
MONO%: 12.5 % (ref 0.0–14.0)
NEUT%: 81.2 % — ABNORMAL HIGH (ref 39.0–75.0)
NEUTROS ABS: 4.3 10*3/uL (ref 1.5–6.5)
Platelets: 140 10*3/uL (ref 140–400)
RBC: 4.76 10*6/uL (ref 4.20–5.82)
RDW: 13.4 % (ref 11.0–14.6)
WBC: 5.3 10*3/uL (ref 4.0–10.3)
lymph#: 0.2 10*3/uL — ABNORMAL LOW (ref 0.9–3.3)

## 2017-07-25 MED ORDER — SODIUM CHLORIDE 0.9% FLUSH
10.0000 mL | INTRAVENOUS | Status: DC | PRN
Start: 2017-07-25 — End: 2022-05-09
  Filled 2017-07-25: qty 10

## 2017-07-25 MED ORDER — HEPARIN SOD (PORK) LOCK FLUSH 100 UNIT/ML IV SOLN
500.0000 [IU] | Freq: Once | INTRAVENOUS | Status: AC
  Filled 2017-07-25: qty 5

## 2017-07-25 MED ORDER — OCTREOTIDE ACETATE 30 MG IM KIT
PACK | INTRAMUSCULAR | Status: AC
  Filled 2017-07-25: qty 1

## 2017-07-25 MED ORDER — OCTREOTIDE ACETATE 30 MG IM KIT
30.0000 mg | PACK | Freq: Once | INTRAMUSCULAR | Status: AC
  Administered 2017-07-25: 30 mg via INTRAMUSCULAR

## 2017-07-25 NOTE — Telephone Encounter (Signed)
Gave avs and calendar for January and february °

## 2017-07-25 NOTE — Patient Instructions (Signed)

## 2017-07-25 NOTE — Progress Notes (Signed)
[place] OFFICE PROGRESS NOTE   Diagnosis: Pancreatic neuroendocrine tumor  INTERVAL HISTORY:   Ronald Thompson completed another treatment with Lutathera on 07/02/2017.  He reports fatigue for a few weeks following treatment.  He had mild nausea lasting 4-5 days.  He also noted constipation following treatment.  He has stable exertional dyspnea.  He is exercising.   Objective:  Vital signs in last 24 hours:  Blood pressure (!) 150/70, pulse 75, temperature 97.6 F (36.4 C), temperature source Oral, resp. rate 18, height 5\' 10"  (1.778 m), weight 183 lb 1.6 oz (83.1 kg), SpO2 96 %.    Resp: Decreased breath sounds throughout the left chest, no respiratory distress Cardio: Regular rate and rhythm GI: No hepatomegaly, nontender, no mass Vascular: No leg edema   Lab Results:  Lab Results  Component Value Date   WBC 5.3 07/25/2017   HGB 15.2 07/25/2017   HCT 45.9 07/25/2017   MCV 96.4 07/25/2017   PLT 140 07/25/2017   NEUTROABS 4.3 07/25/2017    CMP     Component Value Date/Time   NA 141 07/25/2017 0923   K 5.2 No visable hemolysis (H) 07/25/2017 0923   CL 101 01/08/2017 1012   CO2 31 (H) 07/25/2017 0923   GLUCOSE 79 07/25/2017 0923   BUN 19.1 07/25/2017 0923   CREATININE 1.0 07/25/2017 0923   CALCIUM 9.7 07/25/2017 0923   PROT 6.9 07/25/2017 0923   ALBUMIN 4.2 07/25/2017 0923   AST 23 07/25/2017 0923   ALT 22 07/25/2017 0923   ALKPHOS 60 07/25/2017 0923   BILITOT 0.83 07/25/2017 0923   GFRNONAA >60 04/17/2016 0405   GFRAA >60 04/17/2016 0405     Medications: I have reviewed the patient's current medications.   Assessment/Plan:  1. Pancreatic neuroendocrine tumor, WHO grade 2, pancreatic head mass and small peripancreatic/celiac nodes on a CT 02/04/2014  EUS revealed evidence of multiple liver metastases-status post an FNA biopsy of a left liver lesion 02/10/2014 confirming a neuroendocrine tumor   Octreotide scan 04/01/2014 with multiple  foci of metastatic neuroendocrine tumor in the liver  Monthly Sandostatin started 03/16/2014  Restaging CT 07/04/2014 with resolution of a previously noted pancreas head lesion and probable progression of liver metastases  Restaging CT 10/31/2014-stable liver lesions, no evidence of a pancreas mass, stable.  Restaging CT 02/28/2015-stable hepatic metastases, stable pancreas lesion unchanged  Restaging CT 08/30/2015-stable pancreas mass, slight enlargement of hepatic metastases  Monthly Sandostatin continued  Restaging CTs06/14/2017 revealed enlargement of liver lesions, no new lesions, enlargement of the pancreas head mass  Gallium DOTATATEscan 10/01/2016 confirmed uptake in the liver metastases, pancreas primary, peripancreatic adenopathy, and left iliac bone  Cycle 1 Lutathera 03/05/2017  Cycle 2 Lutathera 04/30/2017  Cycle 3 Lutathera 07/02/2017  2. Chest Seminoma in 1990 treated with BEP and chest radiation at [facility] 3. 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 chronic exertional dyspnea following treatment for the seminoma  5. aortic stenosis on an echocardiogram December 2011, severe aortic stenosis on echocardiogram 12/06/2015, status postTAVR on 04/16/2016 6. lumbar disc surgery 2014  7. acute abdominal pain 02/04/2014-resolved   Disposition: Ronald Thompson appears stable.  He will return for labs on 08/26/2016 and received a final treatment with Lutathera on 08/27/2017.  He will receive Sandostatin today and again on 08/27/2017.  He is scheduled for a restaging DOTATATE scan 09/29/2017.  He will return  for an office visit here on 10/01/2017.  We discussed the prognosis and expected results from the [place].  15 minutes were spent with the patient today.  The majority of the time was used for counseling and coordination of  care.  Betsy Coder, MD  07/25/2017  10:43 AM

## 2017-07-30 ENCOUNTER — Ambulatory Visit: Payer: Medicare Other | Admitting: Oncology

## 2017-07-30 ENCOUNTER — Ambulatory Visit: Payer: Medicare Other

## 2017-07-30 ENCOUNTER — Other Ambulatory Visit: Payer: Medicare Other

## 2017-08-26 ENCOUNTER — Encounter: Payer: Self-pay | Admitting: Internal Medicine

## 2017-08-26 ENCOUNTER — Inpatient Hospital Stay: Payer: Medicare Other | Attending: Oncology

## 2017-08-26 DIAGNOSIS — C7B8 Other secondary neuroendocrine tumors: Secondary | ICD-10-CM | POA: Insufficient documentation

## 2017-08-26 DIAGNOSIS — C7A8 Other malignant neuroendocrine tumors: Secondary | ICD-10-CM | POA: Insufficient documentation

## 2017-08-26 DIAGNOSIS — D3A8 Other benign neuroendocrine tumors: Secondary | ICD-10-CM

## 2017-08-26 LAB — COMPREHENSIVE METABOLIC PANEL
ALT: 28 U/L (ref 0–55)
AST: 26 U/L (ref 5–34)
Albumin: 4.1 g/dL (ref 3.5–5.0)
Alkaline Phosphatase: 69 U/L (ref 40–150)
Anion gap: 7 (ref 3–11)
BILIRUBIN TOTAL: 0.9 mg/dL (ref 0.2–1.2)
BUN: 26 mg/dL (ref 7–26)
CALCIUM: 9.5 mg/dL (ref 8.4–10.4)
CHLORIDE: 102 mmol/L (ref 98–109)
CO2: 28 mmol/L (ref 22–29)
CREATININE: 0.91 mg/dL (ref 0.70–1.30)
Glucose, Bld: 64 mg/dL — ABNORMAL LOW (ref 70–140)
POTASSIUM: 5 mmol/L (ref 3.5–5.1)
Sodium: 137 mmol/L (ref 136–145)
Total Protein: 7 g/dL (ref 6.4–8.3)

## 2017-08-26 LAB — CBC WITH DIFFERENTIAL/PLATELET
BASOS PCT: 0 %
Basophils Absolute: 0 10*3/uL (ref 0.0–0.1)
EOS ABS: 0 10*3/uL (ref 0.0–0.5)
Eosinophils Relative: 1 %
HCT: 42.6 % (ref 38.4–49.9)
Hemoglobin: 15.2 g/dL (ref 13.0–17.1)
Lymphocytes Relative: 16 %
Lymphs Abs: 0.8 10*3/uL — ABNORMAL LOW (ref 0.9–3.3)
MCH: 33.1 pg (ref 27.2–33.4)
MCHC: 35.7 g/dL (ref 32.0–36.0)
MCV: 92.8 fL (ref 79.3–98.0)
MONO ABS: 0.3 10*3/uL (ref 0.1–0.9)
MONOS PCT: 6 %
Neutro Abs: 3.7 10*3/uL (ref 1.5–6.5)
Neutrophils Relative %: 77 %
Platelets: 142 10*3/uL (ref 140–400)
RBC: 4.59 MIL/uL (ref 4.20–5.82)
RDW: 12.7 % (ref 11.0–15.6)
WBC: 4.8 10*3/uL (ref 4.0–10.3)

## 2017-08-27 ENCOUNTER — Ambulatory Visit (HOSPITAL_COMMUNITY)
Admission: RE | Admit: 2017-08-27 | Discharge: 2017-08-27 | Disposition: A | Discharge status: Home / Self Care | Payer: Medicare Other | Source: Ambulatory Visit | Attending: Diagnostic Radiology | Admitting: Diagnostic Radiology

## 2017-08-27 ENCOUNTER — Inpatient Hospital Stay: Payer: Medicare Other

## 2017-08-27 VITALS — BP 131/79 | HR 75 | Resp 18

## 2017-08-27 DIAGNOSIS — C254 Malignant neoplasm of endocrine pancreas: Secondary | ICD-10-CM | POA: Diagnosis not present

## 2017-08-27 DIAGNOSIS — C7A8 Other malignant neuroendocrine tumors: Secondary | ICD-10-CM | POA: Diagnosis not present

## 2017-08-27 DIAGNOSIS — D3A8 Other benign neuroendocrine tumors: Secondary | ICD-10-CM

## 2017-08-27 DIAGNOSIS — C7B8 Other secondary neuroendocrine tumors: Secondary | ICD-10-CM | POA: Diagnosis not present

## 2017-08-27 MED ORDER — SODIUM CHLORIDE 0.9 % IV SOLN: 500.0000 mL | Freq: Once | INTRAVENOUS | Status: DC

## 2017-08-27 MED ORDER — OCTREOTIDE ACETATE 500 MCG/ML IJ SOLN
INTRAMUSCULAR | Status: AC
  Filled 2017-08-27: qty 1

## 2017-08-27 MED ORDER — OCTREOTIDE ACETATE 500 MCG/ML IJ SOLN: 500.0000 ug | Freq: Once | INTRAMUSCULAR | Status: DC | PRN

## 2017-08-27 MED ORDER — OCTREOTIDE ACETATE 30 MG IM KIT
30.0000 mg | PACK | Freq: Once | INTRAMUSCULAR | Status: AC
  Administered 2017-08-27: 30 mg via INTRAMUSCULAR

## 2017-08-27 MED ORDER — AMINO ACID RADIOPROTECTANT - L-LYSINE 2.5%/L-ARGININE 2.5% IN NS
250.0000 mL/h | INTRAVENOUS | Status: AC
  Administered 2017-08-27: 250 mL/h via INTRAVENOUS
  Filled 2017-08-27: qty 1000

## 2017-08-27 MED ORDER — LUTETIUM LU 177 DOTATATE 370 MBQ/ML IV SOLN
200.0000 | Freq: Once | INTRAVENOUS | Status: AC
  Administered 2017-08-27: 202.6 via INTRAVENOUS

## 2017-08-27 MED ORDER — SODIUM CHLORIDE 0.9 % IV SOLN
8.0000 mg | Freq: Once | INTRAVENOUS | Status: AC
  Administered 2017-08-27: 8 mg via INTRAVENOUS
  Filled 2017-08-27: qty 4

## 2017-08-27 MED ORDER — OCTREOTIDE ACETATE 30 MG IM KIT
PACK | INTRAMUSCULAR | Status: AC
  Filled 2017-08-27: qty 1

## 2017-08-27 NOTE — Patient Instructions (Signed)

## 2017-08-27 NOTE — Progress Notes (Signed)
Patient returns for fourth and Final Lutathera PRRT therapy 8 weeks following previous therapy. Patient tolerated procedure well with only mild fatigue for several days after the third therapy. Patient's hematological labs, liver function panel and renal function panel are  within normal limits and adequate for full-dose PRRT.   1. Fourth and final] Lu 177 DOTATATE treatment formetastatic neuroendocrine tumor. 2. Patient tolerated procedure well.  Same day Sandostatin injection.  3. Followup Ga 68 DOTATATE PET imaging scheduled in several weeks.    Patient advise to contact radiology with any concerns or questions.

## 2017-08-29 ENCOUNTER — Ambulatory Visit: Payer: Medicare Other

## 2017-09-18 ENCOUNTER — Ambulatory Visit (INDEPENDENT_AMBULATORY_CARE_PROVIDER_SITE_OTHER): Payer: Medicare Other | Admitting: Family Medicine

## 2017-09-18 ENCOUNTER — Encounter: Payer: Self-pay | Admitting: Family Medicine

## 2017-09-18 VITALS — BP 138/84 | HR 81 | Temp 97.8°F | Ht 70.0 in | Wt 182.8 lb

## 2017-09-18 DIAGNOSIS — R059 Cough, unspecified: Secondary | ICD-10-CM

## 2017-09-18 DIAGNOSIS — R05 Cough: Secondary | ICD-10-CM

## 2017-09-18 DIAGNOSIS — J40 Bronchitis, not specified as acute or chronic: Secondary | ICD-10-CM

## 2017-09-18 MED ORDER — AZITHROMYCIN 250 MG PO TABS: ORAL_TABLET | ORAL | 0 refills | Status: DC

## 2017-09-18 NOTE — Progress Notes (Signed)
    Subjective:  Ronald Thompson is a 62 y.o. male who presents today for same-day appointment with a chief complaint of cough.   HPI:  Cough, Acute Issue Started 4 days ago. Worsened over that time. Associated with rhinorrhea, sore throat, malaise, and myalgias.  No fevers.  Has not tried any over-the-counter medications.  No sick contacts.  No obvious precipitating events.  No other obvious alleviating or aggravating factors.  Patient is currently dealing with pancreatic cancer and receiving chemotherapy.  States that his immune system is suppressed and typically has flareups of bronchitis once to twice yearly.  ROS: Per HPI  PMH: He reports that  has never smoked. he has never used smokeless tobacco. He reports that he drinks alcohol. He reports that he does not use drugs.  Objective:  Physical Exam: BP 138/84 (BP Location: Left Arm, Patient Position: Sitting, Cuff Size: Normal)   Pulse 81   Temp 97.8 F (36.6 C) (Oral)   Ht 5\' 10"  (1.778 m)   Wt 182 lb 12.8 oz (82.9 kg)   SpO2 96%   BMI 26.23 kg/m   Gen: NAD, resting comfortably HEENT: TMs clear bilaterally.  His mucosa erythematous with thick, white nasal discharge.  Oropharynx clear.  No lymphadenopathy. CV: RRR with no murmurs appreciated Pulm: NWOB, diffuse end expiratory wheeze.  Speaking in full sentences.  Assessment/Plan:  Cough, bronchitis Given patient's history of pancreatic cancer on chemotherapy as well as radiation related pulmonary fibrosis, it is reasonable to start empiric antibiotics today.  He does not have any red flag signs or symptoms.  His respiratory exam is overall stable.  We will give him a Z-Pak.  Discussed other symptomatic management including Atrovent nasal spray as well as systemic steroids, however patient deferred.  Recommended patient stay well-hydrated over the next several days.  Also recommended continued Tylenol and/or Motrin as needed.  Return precautions reviewed.  Follow-up as needed.  Algis Greenhouse. Jerline Pain, MD 09/18/2017 12:33 PM

## 2017-09-18 NOTE — Patient Instructions (Signed)
Start the azithromycin.  Please stay well hydrated.  You can take tylenol and/or motrin as needed for low grade fever and pain.  Please let me know if your symptoms worsen or fail to improve.  Take care, Dr Jerline Pain

## 2017-09-29 ENCOUNTER — Ambulatory Visit (HOSPITAL_COMMUNITY)
Admission: RE | Admit: 2017-09-29 | Discharge: 2017-09-29 | Disposition: A | Discharge status: Home / Self Care | Payer: Medicare Other | Source: Ambulatory Visit | Attending: Diagnostic Radiology | Admitting: Diagnostic Radiology

## 2017-09-29 ENCOUNTER — Ambulatory Visit: Payer: Medicare Other

## 2017-09-29 DIAGNOSIS — K7689 Other specified diseases of liver: Secondary | ICD-10-CM | POA: Insufficient documentation

## 2017-09-29 DIAGNOSIS — C787 Secondary malignant neoplasm of liver and intrahepatic bile duct: Secondary | ICD-10-CM | POA: Diagnosis not present

## 2017-09-29 DIAGNOSIS — C7A8 Other malignant neuroendocrine tumors: Secondary | ICD-10-CM | POA: Insufficient documentation

## 2017-09-29 DIAGNOSIS — C259 Malignant neoplasm of pancreas, unspecified: Secondary | ICD-10-CM | POA: Diagnosis not present

## 2017-09-29 LAB — CBC WITH DIFFERENTIAL/PLATELET
Basophils Absolute: 0 10*3/uL (ref 0.0–0.1)
Basophils Relative: 0 %
EOS ABS: 0.1 10*3/uL (ref 0.0–0.7)
Eosinophils Relative: 1 %
HEMATOCRIT: 43 % (ref 39.0–52.0)
HEMOGLOBIN: 15.1 g/dL (ref 13.0–17.0)
Lymphocytes Relative: 15 %
Lymphs Abs: 0.8 10*3/uL (ref 0.7–4.0)
MCH: 33.1 pg (ref 26.0–34.0)
MCHC: 35.1 g/dL (ref 30.0–36.0)
MCV: 94.3 fL (ref 78.0–100.0)
Monocytes Absolute: 0.2 10*3/uL (ref 0.1–1.0)
Monocytes Relative: 3 %
NEUTROS ABS: 3.9 10*3/uL (ref 1.7–7.7)
Neutrophils Relative %: 81 %
Platelets: 118 10*3/uL — ABNORMAL LOW (ref 150–400)
RBC: 4.56 MIL/uL (ref 4.22–5.81)
RDW: 12.7 % (ref 11.5–15.5)
WBC: 5 10*3/uL (ref 4.0–10.5)

## 2017-09-29 LAB — COMPREHENSIVE METABOLIC PANEL
ALK PHOS: 58 U/L (ref 38–126)
ALT: 28 U/L (ref 17–63)
AST: 30 U/L (ref 15–41)
Albumin: 4.6 g/dL (ref 3.5–5.0)
Anion gap: 10 (ref 5–15)
BILIRUBIN TOTAL: 1.7 mg/dL — AB (ref 0.3–1.2)
BUN: 18 mg/dL (ref 6–20)
CO2: 29 mmol/L (ref 22–32)
CREATININE: 0.91 mg/dL (ref 0.61–1.24)
Calcium: 9.3 mg/dL (ref 8.9–10.3)
Chloride: 101 mmol/L (ref 101–111)
GFR calc non Af Amer: >60 mL/min (ref >60)
GLUCOSE: 94 mg/dL (ref 65–99)
Potassium: 4.3 mmol/L (ref 3.5–5.1)
SODIUM: 140 mmol/L (ref 135–145)
TOTAL PROTEIN: 7.3 g/dL (ref 6.5–8.1)

## 2017-09-29 MED ORDER — GALLIUM GA 68 DOTATATE IV KIT
4.4400 | PACK | Freq: Once | INTRAVENOUS | Status: AC
  Administered 2017-09-29: 4.44 via INTRAVENOUS

## 2017-10-01 ENCOUNTER — Inpatient Hospital Stay: Payer: Medicare Other

## 2017-10-01 ENCOUNTER — Telehealth: Payer: Self-pay

## 2017-10-01 ENCOUNTER — Inpatient Hospital Stay: Payer: Medicare Other | Attending: Oncology | Admitting: Oncology

## 2017-10-01 VITALS — BP 160/85 | HR 73 | Temp 97.7°F | Resp 18 | Ht 70.0 in | Wt 185.0 lb

## 2017-10-01 DIAGNOSIS — M79601 Pain in right arm: Secondary | ICD-10-CM

## 2017-10-01 DIAGNOSIS — C7A8 Other malignant neuroendocrine tumors: Secondary | ICD-10-CM | POA: Diagnosis not present

## 2017-10-01 DIAGNOSIS — C7B8 Other secondary neuroendocrine tumors: Secondary | ICD-10-CM | POA: Diagnosis not present

## 2017-10-01 DIAGNOSIS — D3A8 Other benign neuroendocrine tumors: Secondary | ICD-10-CM

## 2017-10-01 MED ORDER — OCTREOTIDE ACETATE 30 MG IM KIT
30.0000 mg | PACK | Freq: Once | INTRAMUSCULAR | Status: AC
  Administered 2017-10-01: 30 mg via INTRAMUSCULAR

## 2017-10-01 MED ORDER — OCTREOTIDE ACETATE 30 MG IM KIT
PACK | INTRAMUSCULAR | Status: AC
  Filled 2017-10-01: qty 1

## 2017-10-01 NOTE — Progress Notes (Signed)
[place] [facility] OFFICE PROGRESS NOTE   Diagnosis: Pancreatic neuroendocrine tumor  INTERVAL HISTORY:   Mr. Orourke returns as scheduled.  He completed a final treatment with Lutathera 08/27/2017.  He reports increased malaise following the cycle.  He had an episode of bronchitis several weeks ago.  He continues to have malaise.  He reports increased exertional dyspnea. He has noted discomfort at the right shoulder area with pain in the right lower arm and tingling in the fingers for the past several weeks.  No neck pain.  He is scheduled to see Dr. Jenny Reichmann this week for evaluation of the pain.  Objective:  Vital signs in last 24 hours:  Blood pressure (!) 160/85, pulse 73, temperature 97.7 F (36.5 C), temperature source Oral, resp. rate 18, height 5\' 10"  (1.778 m), weight 185 lb (83.9 kg), SpO2 97 %.    HEENT: Neck without mass Resp: Decreased breath sounds at the left compared to the right chest, no respiratory distress Cardio: Regular rate and rhythm, 2/6 diastolic murmur GI: No hepatomegaly, no mass, nontender Vascular: No leg edema Musculoskeletal: No pain with motion at the right shoulder.  Examination of the right arm is unremarkable.  The strength appears intact at the right arm.   Lab Results:  Lab Results  Component Value Date   WBC 5.0 09/29/2017   HGB 15.1 09/29/2017   HCT 43.0 09/29/2017   MCV 94.3 09/29/2017   PLT 118 (L) 09/29/2017   NEUTROABS 3.9 09/29/2017    CMP     Component Value Date/Time   NA 140 09/29/2017 1256   NA 141 07/25/2017 0923   K 4.3 09/29/2017 1256   K 5.2 No visable hemolysis (H) 07/25/2017 0923   CL 101 09/29/2017 1256   CO2 29 09/29/2017 1256   CO2 31 (H) 07/25/2017 0923   GLUCOSE 94 09/29/2017 1256   GLUCOSE 79 07/25/2017 0923   BUN 18 09/29/2017 1256   BUN 19.1 07/25/2017 0923   CREATININE 0.91 09/29/2017 1256   CREATININE 1.0 07/25/2017 0923   CALCIUM 9.3 09/29/2017 1256   CALCIUM 9.7 07/25/2017 0923   PROT 7.3  09/29/2017 1256   PROT 6.9 07/25/2017 0923   ALBUMIN 4.6 09/29/2017 1256   ALBUMIN 4.2 07/25/2017 0923   AST 30 09/29/2017 1256   AST 23 07/25/2017 0923   ALT 28 09/29/2017 1256   ALT 22 07/25/2017 0923   ALKPHOS 58 09/29/2017 1256   ALKPHOS 60 07/25/2017 0923   BILITOT 1.7 (H) 09/29/2017 1256   BILITOT 0.83 07/25/2017 0923   GFRNONAA >60 09/29/2017 1256   GFRAA >60 09/29/2017 1256      Nm Pet (netspot Ga 34 Dotatate) Skull Base To Mid Thigh  Result Date: 09/29/2017 CLINICAL DATA:  62 year old male with metastatic well-differentiated neuroendocrine tumor with liver metastasis in pancreatic primary. Patient status post 6 months treatment (4 cycles of peptide receptor radiotherapy) of Lu 177 - DOTATATE (Lutathera) . Last Lutathera therapy 08/27/2017. Last Sandostatin injection 08/27/2017 EXAM: NUCLEAR MEDICINE PET SKULL BASE TO THIGH TECHNIQUE: 4.4 mCi Ga 68 DOTATATE was injected intravenously. Full-ring PET imaging was performed from the skull base to thigh after the radiotracer. CT data was obtained and used for attenuation correction and anatomic localization. COMPARISON:  23 DOTATATE scan 10/01/2016 FINDINGS: NECK No radiotracer activity in neck lymph nodes. CHEST No radiotracer accumulation within mediastinal or hilar lymph nodes. No suspicious pulmonary nodules on the CT scan. ABDOMEN/PELVIS The Sandostatin activity within the spleen was markedly different between the two scans.  This potentially could relate to timing of Sandostatin injections. Therefore to normalize activity within the liver lesions, a tumor SUV max / liver SUV mean ratio was utilized. Multiple intensely avid lesions again demonstrate LEFT and RIGHT hepatic lobes. The individual lesions have increased in size. For example lesion in the LEFT lateral hepatic lobe measures 5.1 x 5.3 cm (image 116, series 4) compared to 3.6 x 3.8 cm. The tumor to liver ratio is slight decreased from comparison exam with SUV max tumor / SUV mean  equal 4.2 decreased from 5.6 Lesion in the inferolateral LEFT hepatic lobe measures 4.6 cm in maximum dimension increased from 3.4 cm. Lesion also demonstrates mild decreased relative radiotracer activity with SUV max tumor / SUV mean liver equal 2.5 decreased from 5.8. A large lesion in the dome the RIGHT hepatic lobe measuring 6.6 by 5.0 cm compared to 5.3 by 4.2 cm with SUV max relative uptake 3.0 decreased from 5.9. Lesion inferior RIGHT hepatic lobe (image 134, series 4) measures 5.0 x 3.9 cm compared to 3.7 by 3.6 cm with relative SUV max ratio 4.1 compared to 4.9. Pancreatic lesion: Lesion in the head of the pancreas has decreased relative uptake (SUV max tumor / SUV mean liver) equal 2.4 decreased from 4.7. The small lymph node adjacent the pancreas noted on comparison exam does not have associated radiotracer activity and measures 8 mm (image 139, series 4) decreased from 13 mm. No new lesions in the abdomen pelvis. No lesions associated with the bowel. SKELETON Single solitary metastatic lesion in the LEFT iliac bone has decreased relative activity with SUV max ratio 1.8 decreased from 3.5. IMPRESSION: 1. Lesions in the liver have clearly increased in size with differential including increase in size due to radiation effect versus progression of neoplasm. The radiotracer activity of these lesions is decreased slightly indicating a reduction in the somatostatin receptor population. Recommend utilizing current scan as baseline and follow-up DOTATATE scan 6-12 months. 2. Interval decrease in relative radiotracer activity within the pancreatic head lesion suggest a positive response. 3. Interval decrease in radiotracer activity and size of peripancreatic lymph node. 4. Mild decrease in activity of solitary skeletal lesion in the pelvis. Electronically Signed   By: Suzy Bouchard M.D.   On: 09/29/2017 15:07    Medications: I have reviewed the patient's current  medications.   Assessment/Plan: 1. Pancreatic neuroendocrine tumor, WHO grade 2, pancreatic head mass and small peripancreatic/celiac nodes on a CT 02/04/2014  EUS revealed evidence of multiple liver metastases-status post an FNA biopsy of a left liver lesion 02/10/2014 confirming a neuroendocrine tumor   Octreotide scan 04/01/2014 with multiple foci of metastatic neuroendocrine tumor in the liver  Monthly Sandostatin started 03/16/2014  Restaging CT 07/04/2014 with resolution of a previously noted pancreas head lesion and probable progression of liver metastases  Restaging CT 10/31/2014-stable liver lesions, no evidence of a pancreas mass, stable.  Restaging CT 02/28/2015-stable hepatic metastases, stable pancreas lesion unchanged  Restaging CT 08/30/2015-stable pancreas mass, slight enlargement of hepatic metastases  Monthly Sandostatin continued  Restaging CTs06/14/2017 revealed enlargement of liver lesions, no new lesions, enlargement of the pancreas head mass  Gallium DOTATATEscan 10/01/2016 confirmed uptake in the liver metastases, pancreas primary, peripancreatic adenopathy, and left iliac bone  Cycle 1 Lutathera 03/05/2017  Cycle 2Lutathera09/26/2018  Cycle 3 Lutathera 07/02/2017  Cycle for Lutathera 08/27/2017  Netspot scan 09/29/2017-decreased radiotracer activity within the pancreas head, very pancreatic lymph node, and solitary skeletal lesion.  Liver lesions have increased in size with a  decrease in radiotracer activity  Monthly Sandostatin continued  2. 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 chronic exertional dyspnea following treatment for the seminoma  5. aortic stenosis on an echocardiogram December 2011, severe aortic stenosis  on echocardiogram 12/06/2015, status postTAVR on 04/16/2016 6. lumbar disc surgery 2014  7. acute abdominal pain 02/04/2014-resolved      Disposition: Mr. Delsol completed the course of Lutathera treatment.  The restaging DOTATATE scan reveals enlargement of the liver lesions over the past years, but the radiotracer activity has diminished in the liver and pancreas primary.  I discussed the imaging findings with Mr. Garber.  The plan is to continue monthly Sandostatin with repeat imaging in approximately 9 months.  I reviewed the DOTATATE images with Mr. Tinch.  The etiology of the right arm discomfort is unclear.  This could be related to cervical spine disc disease or another process.  I doubt this is related to the metastatic neuroendocrine tumor.  He will see Dr. Jenny Reichmann.  I suggest an MRI of the cervical spine if his symptoms persist.  He will contact us if the malaise and exertional dyspnea do not improve.  25 minutes were spent with the patient today.  The majority of the time was used for counseling and coordination of care.  Betsy Coder, MD  10/01/2017  9:20 AM

## 2017-10-01 NOTE — Patient Instructions (Signed)

## 2017-10-01 NOTE — Telephone Encounter (Signed)
Printed avs and calender of upcoming appointment. Per 2/27 los 

## 2017-10-02 ENCOUNTER — Ambulatory Visit (INDEPENDENT_AMBULATORY_CARE_PROVIDER_SITE_OTHER): Payer: Medicare Other | Admitting: Internal Medicine

## 2017-10-02 ENCOUNTER — Encounter: Payer: Self-pay | Admitting: Internal Medicine

## 2017-10-02 VITALS — BP 122/82 | HR 78 | Temp 97.7°F | Ht 70.0 in | Wt 190.0 lb

## 2017-10-02 DIAGNOSIS — R739 Hyperglycemia, unspecified: Secondary | ICD-10-CM | POA: Diagnosis not present

## 2017-10-02 DIAGNOSIS — M5412 Radiculopathy, cervical region: Secondary | ICD-10-CM

## 2017-10-02 DIAGNOSIS — G5600 Carpal tunnel syndrome, unspecified upper limb: Secondary | ICD-10-CM | POA: Diagnosis not present

## 2017-10-02 MED ORDER — GABAPENTIN 300 MG PO CAPS: 300.0000 mg | ORAL_CAPSULE | Freq: Three times a day (TID) | ORAL | 3 refills | Status: DC

## 2017-10-02 MED ORDER — GABAPENTIN 100 MG PO CAPS: 100.0000 mg | ORAL_CAPSULE | Freq: Three times a day (TID) | ORAL | 0 refills | Status: DC

## 2017-10-02 NOTE — Patient Instructions (Signed)
Please take all new medication as prescribed - the gabapentin at 100 mg three times per day for the first wk, then 300 mg dosing after that  You will be contacted regarding the referral for: MRI, and Neurosurgury referral  Please wear a right wrist splint (at most pharmacies) at night only to see if the arm pain can improve  Please continue all other medications as before, and refills have been done if requested.  Please have the pharmacy call with any other refills you may need.  Please keep your appointments with your specialists as you may have planned

## 2017-10-02 NOTE — Assessment & Plan Note (Signed)
Mild subtle but with mild RUE weakness/neuro change in a right handed male; cant completely r/o CTS but more likely c-spine related   Ok for gabapentin 100 tid for 1 wk, then 300 tid, MRI Cspine, refer NS and for trial right wrist splint at night as well

## 2017-10-02 NOTE — Progress Notes (Signed)
Subjective:    Patient ID: Ronald Thompson, male    DOB: 02/17/1956, 62 y.o.   MRN: [id number]  HPI  Here to f/u bilat arm pain, seemed to start about 1 mo ago with reaching with the left arm laterally but had a twinge, worse the next day wit lifting wt, then quite severe pain to the left shoulder x 5 days then resolved.  More recently with right elbow pain mild intermittent worse with movement, but even more recently with whole arm becoming painful and tingling in the hand.  No hand weakness, not dropping anything, still can do some [employer].  No prior hx of TIA or CVA, and Pt denies new neurological symptoms such as new headache, or facial or other extremity weakness.  Recent PET scan neg for lesions.  No neck pain, no hx of prior radicular pain.  Does have some right wrist pain occas with use of keyboard but just does not feel like the CTS symptoms he had several yrs ago, but better with wrist splint  Pt denies chest pain, increased sob or doe, wheezing, orthopnea, PND, increased LE swelling, palpitations, dizziness or syncope.   Pt denies polydipsia, polyuria. Past Medical History:  Diagnosis Date  . Baker's cyst   . Gallstones   . Heart murmur   . Hypercholesteremia   . Hypertension   . Lesion of left lung    hx.25 yrs ago- testicilar cancer related- only scarring left after tx.-no problems now  . Liver metastasis ([place])   . 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  . Pericarditis    2nd to tumor  . Pneumonia 1990  . Primary neuroendocrine tumor of pancreas 02/14/2014   Stage IV with multiple mets to liver  . Pulmonary fibrosis ([place]) 11/08/2015  . S/P TAVR (transcatheter aortic valve replacement) 04/16/2016   26 mm Edwards Sapien 3 transcatheter heart valve placed via percutaneous right transfemoral approach   . Shortness of breath dyspnea    with exertion and when tired  . 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 Transfusion history    hx. 25 yrs ago-during cancer tx.   Past Surgical History:  Procedure Laterality Date  . BACK SURGERY     '14- rupt. disc   . CARDIAC CATHETERIZATION N/A 01/24/2016   Procedure: Right/Left Heart Cath and Coronary Angiography;  Surgeon: [name], MD;  Location: [place] [facility];  Service: Cardiovascular;  Laterality: N/A;  . COLONOSCOPY    . EUS N/A 02/10/2014   Procedure: UPPER ENDOSCOPIC ULTRASOUND (EUS) LINEAR;  Surgeon: [name], MD;  Location: [facility];  Service: Endoscopy;  Laterality: N/A;  . IR RADIOLOGIST EVAL & MGMT  02/11/2017  . KNEE SURGERY Right    age 60 for Baker's cyst  . NASAL SEPTUM SURGERY     Deviated septrum  . TEE WITHOUT CARDIOVERSION N/A 04/16/2016   Procedure: TRANSESOPHAGEAL ECHOCARDIOGRAM (TEE);  Surgeon: [name], MD;  Location: [place];  Service: Open Heart Surgery;  Laterality: N/A;  . THORACOTOMY  1990   wedge biopsy of mediastinal mass  . TRANSCATHETER AORTIC VALVE REPLACEMENT, TRANSFEMORAL N/A 04/16/2016   Procedure: TRANSCATHETER AORTIC VALVE REPLACEMENT, TRANSFEMORAL;  Surgeon: [name], MD;  Location: [place];  Service: Open Heart Surgery;  Laterality: N/A;    reports that  has never smoked. he has never  used smokeless tobacco. He reports that he drinks alcohol. He reports that he does not use drugs. family history includes Cancer in his maternal aunt and paternal grandfather; Dementia in his mother; Emphysema in his maternal aunt. Allergies  Allergen Reactions  . Penicillins Hives    ENTIRE BODY Has patient had a PCN reaction causing immediate rash, facial/tongue/throat swelling, SOB or lightheadedness with hypotension: No Has patient had a PCN reaction causing severe rash involving mucus membranes or skin necrosis: No Has patient had a PCN reaction that required hospitalization * *  YES  * * Has patient had a PCN  reaction occurring within the last 10 years: No If all of the above answers are "NO", then may proceed with Cephalosporin use. *reaction occurred when he was 17   Current Outpatient Medications on File Prior to Visit  Medication Sig Dispense Refill  . acetaminophen (TYLENOL) 500 MG tablet Take 1,000 mg by mouth every 6 (six) hours as needed for headache.    . albuterol (PROVENTIL HFA;VENTOLIN HFA) 108 (90 Base) MCG/ACT inhaler Inhale 1-2 puffs into the lungs every 6 (six) hours as needed for wheezing or shortness of breath. 1 Inhaler 0  . aspirin EC 81 MG tablet Take 81 mg by mouth every evening.    . calcium carbonate (TUMS - DOSED IN MG ELEMENTAL CALCIUM) 500 MG chewable tablet Chew 3 tablets by mouth 3 (three) times daily as needed for indigestion or heartburn.     . clindamycin (CLEOCIN) 300 MG capsule Take 2 capsules by mouth one hour prior to dental appointment 6 capsule 2  . docusate sodium (COLACE) 100 MG capsule Take 100 mg by mouth daily as needed for mild constipation.    Marland Kitchen ibuprofen (ADVIL,MOTRIN) 200 MG tablet Take 400 mg by mouth every 6 (six) hours as needed for headache or mild pain.     Marland Kitchen losartan (COZAAR) 50 MG tablet Take 1 tablet (50 mg total) by mouth daily. 90 tablet 3  . Octreotide Acetate (SANDOSTATIN IJ) Inject 1 each as directed every 30 (thirty) days.     Marland Kitchen oxyCODONE (OXY IR/ROXICODONE) 5 MG immediate release tablet 1-2 tabs PO Q 6 hours PRN pain. 45 tablet 0  . sennosides-docusate sodium (SENOKOT-S) 8.6-50 MG tablet Take 1 tablet by mouth as needed for constipation.    . sildenafil (REVATIO) 20 MG tablet Take 1 tablet (20 mg total) by mouth daily as needed. Take 2-5 tablets as needed. 60 tablet 3  . simvastatin (ZOCOR) 20 MG tablet TAKE ONE TABLET BY MOUTH DAILY AT 6 PM 90 tablet 3   Current Facility-Administered Medications on File Prior to Visit  Medication Dose Route Frequency Provider Last Rate Last Dose  . heparin lock flush 100 unit/mL  500 Units Intravenous  Once Betsy Coder B, MD      . sodium chloride flush (NS) 0.9 % injection 10 mL  10 mL Intracatheter PRN Ladell Pier, MD       Review of Systems Constitutional: Negative for other unusual diaphoresis, sweats, appetite or weight changes HENT: Negative for other worsening hearing loss, ear pain, facial swelling, mouth sores or neck stiffness.   Eyes: Negative for other worsening pain, redness or other visual disturbance.  Respiratory: Negative for other stridor or swelling Cardiovascular: Negative for other palpitations or other chest pain  Gastrointestinal: Negative for worsening diarrhea or loose stools, blood in stool, distention or other pain Genitourinary: Negative for hematuria, flank pain or other change in urine volume.  Musculoskeletal: Negative for  myalgias or other joint swelling.  Skin: Negative for other color change, or other wound or worsening drainage.  Neurological: Negative for other syncope or numbness. Hematological: Negative for other adenopathy or swelling Psychiatric/Behavioral: Negative for hallucinations, other worsening agitation, SI, self-injury, or new decreased concentration All other system neg per pt    Objective:   Physical Exam BP 122/82   Pulse 78   Temp 97.7 F (36.5 C) (Oral)   Ht 5\' 10"  (1.778 m)   Wt 190 lb (86.2 kg)   SpO2 98%   BMI 27.26 kg/m  VS noted,  Constitutional: Pt appears in NAD HENT: Head: NCAT.  Right Ear: External ear normal.  Left Ear: External ear normal.  Eyes: . Pupils are equal, round, and reactive to light. Conjunctivae and EOM are normal Nose: without d/c or deformity Neck: Neck supple. Gross normal ROM Cardiovascular: Normal rate and regular rhythm.   Pulmonary/Chest: Effort normal and breath sounds without rales or wheezing.  Neurological: Pt is alert. At baseline orientation, motor grossly intact except 4+/5 RUE motor Skin: Skin is warm. No rashes, other new lesions, no LE edema Psychiatric: Pt behavior is  normal without agitation  No other exam findings    Assessment & Plan:

## 2017-10-04 DIAGNOSIS — G5600 Carpal tunnel syndrome, unspecified upper limb: Secondary | ICD-10-CM | POA: Insufficient documentation

## 2017-10-04 NOTE — Assessment & Plan Note (Signed)
Lab Results  Component Value Date   HGBA1C 6.2 01/08/2017  stable overall by history and exam, recent data reviewed with pt, and pt to continue medical treatment as before,  to f/u any worsening symptoms or concerns

## 2017-10-04 NOTE — Assessment & Plan Note (Signed)
Mild, for wrist splint qhs prn 

## 2017-10-21 ENCOUNTER — Ambulatory Visit
Admission: RE | Admit: 2017-10-21 | Discharge: 2017-10-21 | Disposition: A | Discharge status: Home / Self Care | Payer: Medicare Other | Source: Ambulatory Visit | Attending: Internal Medicine | Admitting: Internal Medicine

## 2017-10-21 ENCOUNTER — Encounter: Payer: Self-pay | Admitting: Internal Medicine

## 2017-10-21 DIAGNOSIS — M4802 Spinal stenosis, cervical region: Secondary | ICD-10-CM | POA: Diagnosis not present

## 2017-10-21 DIAGNOSIS — M5412 Radiculopathy, cervical region: Secondary | ICD-10-CM

## 2017-10-21 NOTE — Progress Notes (Signed)
Shirron to please print letter for me to sign 

## 2017-10-29 ENCOUNTER — Inpatient Hospital Stay: Payer: Medicare Other

## 2017-10-29 ENCOUNTER — Telehealth: Payer: Self-pay

## 2017-10-29 ENCOUNTER — Inpatient Hospital Stay: Payer: Medicare Other | Attending: Oncology | Admitting: Oncology

## 2017-10-29 VITALS — BP 153/77 | HR 88 | Temp 98.1°F | Resp 18 | Ht 70.0 in | Wt 185.7 lb

## 2017-10-29 DIAGNOSIS — D3A8 Other benign neuroendocrine tumors: Secondary | ICD-10-CM

## 2017-10-29 DIAGNOSIS — C7B8 Other secondary neuroendocrine tumors: Secondary | ICD-10-CM | POA: Diagnosis not present

## 2017-10-29 DIAGNOSIS — C7A8 Other malignant neuroendocrine tumors: Secondary | ICD-10-CM | POA: Insufficient documentation

## 2017-10-29 LAB — CMP (CANCER CENTER ONLY)
ALK PHOS: 64 U/L (ref 40–150)
ALT: 23 U/L (ref 0–55)
AST: 24 U/L (ref 5–34)
Albumin: 4.1 g/dL (ref 3.5–5.0)
Anion gap: 8 (ref 3–11)
BILIRUBIN TOTAL: 0.9 mg/dL (ref 0.2–1.2)
BUN: 19 mg/dL (ref 7–26)
CALCIUM: 9.7 mg/dL (ref 8.4–10.4)
CO2: 29 mmol/L (ref 22–29)
Chloride: 102 mmol/L (ref 98–109)
Creatinine: 1.01 mg/dL (ref 0.70–1.30)
GFR, Estimated: >60 mL/min (ref >60)
Glucose, Bld: 148 mg/dL — ABNORMAL HIGH (ref 70–140)
Potassium: 5 mmol/L (ref 3.5–5.1)
SODIUM: 139 mmol/L (ref 136–145)
TOTAL PROTEIN: 6.9 g/dL (ref 6.4–8.3)

## 2017-10-29 LAB — CBC WITH DIFFERENTIAL (CANCER CENTER ONLY)
BASOS ABS: 0 10*3/uL (ref 0.0–0.1)
BASOS PCT: 0 %
Eosinophils Absolute: 0.1 10*3/uL (ref 0.0–0.5)
Eosinophils Relative: 2 %
HEMATOCRIT: 42.6 % (ref 38.4–49.9)
HEMOGLOBIN: 14.6 g/dL (ref 13.0–17.1)
Lymphocytes Relative: 6 %
Lymphs Abs: 0.3 10*3/uL — ABNORMAL LOW (ref 0.9–3.3)
MCH: 33 pg (ref 27.2–33.4)
MCHC: 34.3 g/dL (ref 32.0–36.0)
MCV: 96.4 fL (ref 79.3–98.0)
Monocytes Absolute: 0.4 10*3/uL (ref 0.1–0.9)
Monocytes Relative: 9 %
NEUTROS ABS: 3.3 10*3/uL (ref 1.5–6.5)
NEUTROS PCT: 83 %
Platelet Count: 118 10*3/uL — ABNORMAL LOW (ref 140–400)
RBC: 4.42 MIL/uL (ref 4.20–5.82)
RDW: 13.2 % (ref 11.0–14.6)
WBC: 3.9 10*3/uL — AB (ref 4.0–10.3)

## 2017-10-29 MED ORDER — OCTREOTIDE ACETATE 30 MG IM KIT
PACK | INTRAMUSCULAR | Status: AC
  Filled 2017-10-29: qty 1

## 2017-10-29 MED ORDER — OCTREOTIDE ACETATE 30 MG IM KIT
30.0000 mg | PACK | Freq: Once | INTRAMUSCULAR | Status: AC
  Administered 2017-10-29: 30 mg via INTRAMUSCULAR

## 2017-10-29 NOTE — Patient Instructions (Signed)

## 2017-10-29 NOTE — Telephone Encounter (Signed)
Printed avs and calender of upcoming appointment. Per 3/27 los 

## 2017-10-29 NOTE — Progress Notes (Signed)
[facility] OFFICE PROGRESS NOTE   Diagnosis: Pancreas neuroendocrine tumor  INTERVAL HISTORY:   Mr. Yonker returns as scheduled.  He reports the right arm pain is relieved with gabapentin.  He continues to have tingling in the right hand.  He saw Dr. Jenny Reichmann.  An MRI of the cervical spine on 10/21/2013.  There is no evidence of malignancy.  Multilevel degenerative changes were noted.  He otherwise feels well.  Objective:  Vital signs in last 24 hours:  Blood pressure (!) 153/77, pulse 88, temperature 98.1 F (36.7 C), temperature source Oral, resp. rate 18, height 5\' 10"  (1.778 m), weight 185 lb 11.2 oz (84.2 kg), SpO2 97 %.  Resp: Lungs clear bilaterally with decreased breath sounds at the left compared to the right chest, no respiratory distress Cardio: Regular rate and rhythm GI: No hepatosplenomegaly, nontender Vascular: No leg edema Neuro: The arm strength appears intact    Portacath/PICC-without erythema  Lab Results:  Lab Results  Component Value Date   WBC 3.9 (L) 10/29/2017   HGB 15.1 09/29/2017   HCT 42.6 10/29/2017   MCV 96.4 10/29/2017   PLT 118 (L) 10/29/2017   NEUTROABS 3.3 10/29/2017    CMP     Component Value Date/Time   NA 139 10/29/2017 1004   NA 141 07/25/2017 0923   K 5.0 10/29/2017 1004   K 5.2 No visable hemolysis (H) 07/25/2017 0923   CL 102 10/29/2017 1004   CO2 29 10/29/2017 1004   CO2 31 (H) 07/25/2017 0923   GLUCOSE 148 (H) 10/29/2017 1004   GLUCOSE 79 07/25/2017 0923   BUN 19 10/29/2017 1004   BUN 19.1 07/25/2017 0923   CREATININE 1.01 10/29/2017 1004   CREATININE 1.0 07/25/2017 0923   CALCIUM 9.7 10/29/2017 1004   CALCIUM 9.7 07/25/2017 0923   PROT 6.9 10/29/2017 1004   PROT 6.9 07/25/2017 0923   ALBUMIN 4.1 10/29/2017 1004   ALBUMIN 4.2 07/25/2017 0923   AST 24 10/29/2017 1004   AST 23 07/25/2017 0923   ALT 23 10/29/2017 1004   ALT 22 07/25/2017 0923   ALKPHOS 64 10/29/2017 1004   ALKPHOS 60 07/25/2017 0923   BILITOT 0.9 10/29/2017 1004   BILITOT 0.83 07/25/2017 0923   GFRNONAA >60 10/29/2017 1004   GFRAA >60 10/29/2017 1004     Medications: I have reviewed the patient's current medications.   Assessment/Plan: 1. Pancreatic neuroendocrine tumor, WHO grade 2, pancreatic head mass and small peripancreatic/celiac nodes on a CT 02/04/2014  EUS revealed evidence of multiple liver metastases-status post an FNA biopsy of a left liver lesion 02/10/2014 confirming a neuroendocrine tumor   Octreotide scan 04/01/2014 with multiple foci of metastatic neuroendocrine tumor in the liver  Monthly Sandostatin started 03/16/2014  Restaging CT 07/04/2014 with resolution of a previously noted pancreas head lesion and probable progression of liver metastases  Restaging CT 10/31/2014-stable liver lesions, no evidence of a pancreas mass, stable.  Restaging CT 02/28/2015-stable hepatic metastases, stable pancreas lesion unchanged  Restaging CT 08/30/2015-stable pancreas mass, slight enlargement of hepatic metastases  Monthly Sandostatin continued  Restaging CTs06/14/2017 revealed enlargement of liver lesions, no new lesions, enlargement of the pancreas head mass  Gallium DOTATATEscan 10/01/2016 confirmed uptake in the liver metastases, pancreas primary, peripancreatic adenopathy, and left iliac bone  Cycle 1 Lutathera 03/05/2017  Cycle 2Lutathera09/26/2018  Cycle 3 Lutathera 07/02/2017  Cycle for Lutathera 08/27/2017  Netspot scan 09/29/2017-decreased radiotracer activity within the pancreas head, very pancreatic lymph node, and solitary skeletal lesion.  Liver  lesions have increased in size with a decrease in radiotracer activity  Monthly Sandostatin continued  2. Chest Seminoma in 1990 treated with BEP and chest radiation at [facility] 3. 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 chronic exertional dyspnea following treatment for the seminoma  5. aortic stenosis on an echocardiogram December 2011, severe aortic stenosis on echocardiogram 12/06/2015, status postTAVR on 04/16/2016 6. lumbar disc surgery 2014  7. acute abdominal pain 02/04/2014-resolved    Disposition: Mr. Blunck appears well.  He will continue monthly Sandostatin.  We will plan for repeat imaging of the liver at a 56-month interval.  He will return for an office visit in 4 months.  He has been referred to neurosurgery to evaluate the right arm pain and tingling.  15 minutes were spent with the patient today.  The majority of the time was used for counseling and coordination of care.  Betsy Coder, MD  10/29/2017  11:50 AM

## 2017-11-10 DIAGNOSIS — M5010 Cervical disc disorder with radiculopathy, unspecified cervical region: Secondary | ICD-10-CM | POA: Diagnosis not present

## 2017-11-10 DIAGNOSIS — Z6827 Body mass index (BMI) 27.0-27.9, adult: Secondary | ICD-10-CM | POA: Diagnosis not present

## 2017-11-10 DIAGNOSIS — I10 Essential (primary) hypertension: Secondary | ICD-10-CM | POA: Diagnosis not present

## 2017-11-27 ENCOUNTER — Inpatient Hospital Stay: Payer: Medicare Other | Attending: Oncology

## 2017-11-27 VITALS — BP 136/81 | HR 69 | Temp 98.1°F | Resp 18

## 2017-11-27 DIAGNOSIS — C7B8 Other secondary neuroendocrine tumors: Secondary | ICD-10-CM | POA: Insufficient documentation

## 2017-11-27 DIAGNOSIS — D3A8 Other benign neuroendocrine tumors: Secondary | ICD-10-CM

## 2017-11-27 DIAGNOSIS — C7A8 Other malignant neuroendocrine tumors: Secondary | ICD-10-CM | POA: Diagnosis not present

## 2017-11-27 MED ORDER — OCTREOTIDE ACETATE 30 MG IM KIT
30.0000 mg | PACK | Freq: Once | INTRAMUSCULAR | Status: AC
  Administered 2017-11-27: 30 mg via INTRAMUSCULAR

## 2017-11-27 MED ORDER — OCTREOTIDE ACETATE 30 MG IM KIT
PACK | INTRAMUSCULAR | Status: AC
  Filled 2017-11-27: qty 1

## 2017-12-30 ENCOUNTER — Inpatient Hospital Stay: Payer: Medicare Other | Attending: Oncology

## 2017-12-30 DIAGNOSIS — D3A8 Other benign neuroendocrine tumors: Secondary | ICD-10-CM

## 2017-12-30 DIAGNOSIS — C7B8 Other secondary neuroendocrine tumors: Secondary | ICD-10-CM | POA: Insufficient documentation

## 2017-12-30 DIAGNOSIS — C7A8 Other malignant neuroendocrine tumors: Secondary | ICD-10-CM | POA: Diagnosis not present

## 2017-12-30 MED ORDER — OCTREOTIDE ACETATE 30 MG IM KIT
PACK | INTRAMUSCULAR | Status: AC
  Filled 2017-12-30: qty 1

## 2017-12-30 MED ORDER — OCTREOTIDE ACETATE 30 MG IM KIT
30.0000 mg | PACK | Freq: Once | INTRAMUSCULAR | Status: AC
  Administered 2017-12-30: 30 mg via INTRAMUSCULAR

## 2017-12-30 NOTE — Patient Instructions (Signed)

## 2018-01-09 ENCOUNTER — Ambulatory Visit: Payer: Medicare Other

## 2018-01-09 ENCOUNTER — Ambulatory Visit: Payer: Medicare Other | Admitting: Internal Medicine

## 2018-01-29 ENCOUNTER — Inpatient Hospital Stay: Payer: Medicare Other | Attending: Oncology

## 2018-01-29 DIAGNOSIS — D3A8 Other benign neuroendocrine tumors: Secondary | ICD-10-CM

## 2018-01-29 DIAGNOSIS — C7A8 Other malignant neuroendocrine tumors: Secondary | ICD-10-CM | POA: Diagnosis not present

## 2018-01-29 DIAGNOSIS — C7B8 Other secondary neuroendocrine tumors: Secondary | ICD-10-CM | POA: Insufficient documentation

## 2018-01-29 MED ORDER — OCTREOTIDE ACETATE 30 MG IM KIT
30.0000 mg | PACK | Freq: Once | INTRAMUSCULAR | Status: AC
  Administered 2018-01-29: 30 mg via INTRAMUSCULAR

## 2018-01-29 MED ORDER — OCTREOTIDE ACETATE 30 MG IM KIT
PACK | INTRAMUSCULAR | Status: AC
  Filled 2018-01-29: qty 1

## 2018-01-29 NOTE — Patient Instructions (Signed)

## 2018-02-11 ENCOUNTER — Ambulatory Visit (INDEPENDENT_AMBULATORY_CARE_PROVIDER_SITE_OTHER): Payer: Medicare Other | Admitting: Internal Medicine

## 2018-02-11 ENCOUNTER — Ambulatory Visit (INDEPENDENT_AMBULATORY_CARE_PROVIDER_SITE_OTHER): Payer: Medicare Other | Admitting: *Deleted

## 2018-02-11 ENCOUNTER — Other Ambulatory Visit (INDEPENDENT_AMBULATORY_CARE_PROVIDER_SITE_OTHER): Payer: Medicare Other

## 2018-02-11 ENCOUNTER — Encounter: Payer: Self-pay | Admitting: Internal Medicine

## 2018-02-11 VITALS — BP 158/98 | HR 88 | Temp 98.8°F | Ht 70.0 in | Wt 187.0 lb

## 2018-02-11 VITALS — BP 158/98 | HR 88 | Resp 18 | Ht 70.0 in | Wt 187.0 lb

## 2018-02-11 DIAGNOSIS — N529 Male erectile dysfunction, unspecified: Secondary | ICD-10-CM

## 2018-02-11 DIAGNOSIS — R972 Elevated prostate specific antigen [PSA]: Secondary | ICD-10-CM | POA: Insufficient documentation

## 2018-02-11 DIAGNOSIS — Z0000 Encounter for general adult medical examination without abnormal findings: Secondary | ICD-10-CM | POA: Diagnosis not present

## 2018-02-11 DIAGNOSIS — R739 Hyperglycemia, unspecified: Secondary | ICD-10-CM | POA: Diagnosis not present

## 2018-02-11 DIAGNOSIS — E785 Hyperlipidemia, unspecified: Secondary | ICD-10-CM

## 2018-02-11 DIAGNOSIS — I10 Essential (primary) hypertension: Secondary | ICD-10-CM | POA: Insufficient documentation

## 2018-02-11 LAB — URINALYSIS, ROUTINE W REFLEX MICROSCOPIC
BILIRUBIN URINE: NEGATIVE
Ketones, ur: NEGATIVE
LEUKOCYTES UA: NEGATIVE
Nitrite: NEGATIVE
PH: 6 (ref 5.0–8.0)
SPECIFIC GRAVITY, URINE: 1.025 (ref 1.000–1.030)
URINE GLUCOSE: NEGATIVE
UROBILINOGEN UA: 0.2 (ref 0.0–1.0)

## 2018-02-11 LAB — HEMOGLOBIN A1C: HEMOGLOBIN A1C: 6.1 % (ref 4.6–6.5)

## 2018-02-11 MED ORDER — ZOSTER VAC RECOMB ADJUVANTED 50 MCG/0.5ML IM SUSR: 0.5000 mL | Freq: Once | INTRAMUSCULAR | 1 refills | Status: AC

## 2018-02-11 MED ORDER — SILDENAFIL CITRATE 100 MG PO TABS: 50.0000 mg | ORAL_TABLET | Freq: Every day | ORAL | 11 refills | Status: DC | PRN

## 2018-02-11 NOTE — Progress Notes (Addendum)
Subjective:   Haedyn Ancrum is a 62 y.o. male who presents for an Initial Medicare Annual Wellness Visit.  Review of Systems  No ROS.  Medicare Wellness Visit. Additional risk factors are reflected in the social history.  Cardiac Risk Factors include: dyslipidemia;male gender;hypertension;advanced age (>52men, >74 women) Sleep patterns: has frequent nighttime awakenings, gets up 2-3 times nightly to void and sleeps 7-8 hours nightly.    Home Safety/Smoke Alarms: Feels safe in home. Smoke alarms in place.  Living environment; residence and Firearm Safety: 1-story house/ trailer, no firearms. Lives with wife, no needs for DME, good support system Seat Belt Safety/Bike Helmet: Wears seat belt.    Objective:    Today's Vitals   02/11/18 1554  BP: (!) 158/98  Pulse: 88  Resp: 18  SpO2: 98%  Weight: 187 lb (84.8 kg)  Height: 5\' 10"  (1.778 m)   Body mass index is 26.83 kg/m.  Advanced Directives 02/11/2018 12/26/2016 11/26/2016 10/02/2016 07/22/2016 07/02/2016 04/26/2016  Does Patient Have a Medical Advance Directive? Yes Yes Yes Yes Yes Yes Yes  Type of Paramedic of [place];Living will - [place];Living will [place];Living will [place];Living will [place];Living will  Does patient want to make changes to medical advance directive? - - No - Patient declined - - - -  Copy of [place] in Chart? No - copy requested No - copy requested - No - copy requested - No - copy requested No - copy requested  Would patient like information on creating a medical advance directive? - - - - - - -  Pre-existing out of facility DNR order (yellow form or pink MOST form) - - - - - - -    Current Medications (verified) Outpatient Encounter Medications as of 02/11/2018  Medication Sig  . acetaminophen (TYLENOL) 500 MG tablet Take 1,000 mg by mouth every  6 (six) hours as needed for headache.  . albuterol (PROVENTIL HFA;VENTOLIN HFA) 108 (90 Base) MCG/ACT inhaler Inhale 1-2 puffs into the lungs every 6 (six) hours as needed for wheezing or shortness of breath.  Marland Kitchen aspirin EC 81 MG tablet Take 81 mg by mouth every evening.  . calcium carbonate (TUMS - DOSED IN MG ELEMENTAL CALCIUM) 500 MG chewable tablet Chew 3 tablets by mouth 3 (three) times daily as needed for indigestion or heartburn.   . clindamycin (CLEOCIN) 300 MG capsule Take 2 capsules by mouth one hour prior to dental appointment  . docusate sodium (COLACE) 100 MG capsule Take 100 mg by mouth daily as needed for mild constipation.  . gabapentin (NEURONTIN) 100 MG capsule Take 1 capsule (100 mg total) by mouth 3 (three) times daily.  Marland Kitchen gabapentin (NEURONTIN) 300 MG capsule Take 1 capsule (300 mg total) by mouth 3 (three) times daily.  Marland Kitchen ibuprofen (ADVIL,MOTRIN) 200 MG tablet Take 400 mg by mouth every 6 (six) hours as needed for headache or mild pain.   Marland Kitchen losartan (COZAAR) 50 MG tablet Take 1 tablet (50 mg total) by mouth daily.  . Octreotide Acetate (SANDOSTATIN IJ) Inject 1 each as directed every 30 (thirty) days.   Marland Kitchen oxyCODONE (OXY IR/ROXICODONE) 5 MG immediate release tablet 1-2 tabs PO Q 6 hours PRN pain.  Marland Kitchen sennosides-docusate sodium (SENOKOT-S) 8.6-50 MG tablet Take 1 tablet by mouth as needed for constipation.  . sildenafil (REVATIO) 20 MG tablet Take 1 tablet (20 mg total) by mouth daily  as needed. Take 2-5 tablets as needed.  . simvastatin (ZOCOR) 20 MG tablet TAKE ONE TABLET BY MOUTH DAILY AT 6 PM   Facility-Administered Encounter Medications as of 02/11/2018  Medication  . heparin lock flush 100 unit/mL  . sodium chloride flush (NS) 0.9 % injection 10 mL    Allergies (verified) Penicillins   History: Past Medical History:  Diagnosis Date  . Baker's cyst   . Gallstones   . Heart murmur   . Hypercholesteremia   . Hypertension   . Lesion of left lung    hx.25 yrs ago-  testicilar cancer related- only scarring left after tx.-no problems now  . Liver metastasis ([place])   . 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  . Pericarditis    2nd to tumor  . Pneumonia 1990  . Primary neuroendocrine tumor of pancreas 02/14/2014   Stage IV with multiple mets to liver  . Pulmonary fibrosis ([place]) 11/08/2015  . S/P TAVR (transcatheter aortic valve replacement) 04/16/2016   26 mm Edwards Sapien 3 transcatheter heart valve placed via percutaneous right transfemoral approach   . Shortness of breath dyspnea    with exertion and when tired  . 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 Transfusion history    hx. 25 yrs ago-during cancer tx.   Past Surgical History:  Procedure Laterality Date  . BACK SURGERY     '14- rupt. disc   . CARDIAC CATHETERIZATION N/A 01/24/2016   Procedure: Right/Left Heart Cath and Coronary Angiography;  Surgeon: [name], MD;  Location: [facility];  Service: Cardiovascular;  Laterality: N/A;  . COLONOSCOPY    . EUS N/A 02/10/2014   Procedure: UPPER ENDOSCOPIC ULTRASOUND (EUS) LINEAR;  Surgeon: [name], MD;  Location: [facility];  Service: Endoscopy;  Laterality: N/A;  . IR RADIOLOGIST EVAL & MGMT  02/11/2017  . KNEE SURGERY Right    age 24 for Baker's cyst  . NASAL SEPTUM SURGERY     Deviated septrum  . TEE WITHOUT CARDIOVERSION N/A 04/16/2016   Procedure: TRANSESOPHAGEAL ECHOCARDIOGRAM (TEE);  Surgeon: [name], MD;  Location: [facility];  Service: Open Heart Surgery;  Laterality: N/A;  . THORACOTOMY  1990   wedge biopsy of mediastinal mass  . TRANSCATHETER AORTIC VALVE REPLACEMENT, TRANSFEMORAL N/A 04/16/2016   Procedure: TRANSCATHETER AORTIC VALVE REPLACEMENT, TRANSFEMORAL;  Surgeon: [name], MD;  Location: [facility];  Service: Open Heart Surgery;  Laterality: N/A;    Family History  Problem Relation Age of Onset  . Dementia Mother   . Cancer Paternal Grandfather        esophagus  . Cancer Maternal Aunt        colon  . Emphysema Maternal Aunt    Social History   Socioeconomic History  . Marital status: Married    Spouse name: Not on file  . Number of children: 3  . Years of education: 7  . Highest education level: Not on file  Occupational History  . Occupation: Administrator, sports: [employer]  Social Needs  . Financial resource strain: Not hard at all  . Food insecurity:    Worry: Never true    Inability: Never true  . Transportation needs:    Medical: No    Non-medical: No  Tobacco Use  . Smoking status: Never Smoker  . Smokeless  tobacco: Never Used  Substance and Sexual Activity  . Alcohol use: Yes    Comment: rare- social  . Drug use: No  . Sexual activity: Yes    Partners: Female  Lifestyle  . Physical activity:    Days per week: 4 days    Minutes per session: 40 min  . Stress: To some extent  Relationships  . Social connections:    Talks on phone: More than three times a week    Gets together: More than three times a week    Attends religious service: 1 to 4 times per year    Active member of club or organization: Yes    Attends meetings of clubs or organizations: More than 4 times per year    Relationship status: Married  Other Topics Concern  . Not on file  Social History Narrative   [name]. 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 2 sons: 1979, 1985- married with 2 daughters 1 in route: Oldest in [place], younger [place], [place]. 1 daughter: 68 [place], married-1 granddaughter.Customer service manager- Nurse, mental health. SO- good health; marriage in good health   Enjoys watching movies, works out at home gym   [place] given: Not Answered  Activities of Daily Living In your present state of health, do you have any difficulty performing the following activities: 02/11/2018  Hearing? N  Vision? N  Difficulty  concentrating or making decisions? N  Walking or climbing stairs? N  Dressing or bathing? N  Doing errands, shopping? N  Preparing Food and eating ? N  Using the Toilet? N  In the past six months, have you accidently leaked urine? N  Do you have problems with loss of bowel control? N  Managing your Medications? N  Managing your Finances? N  Housekeeping or managing your Housekeeping? N  Some recent data might be hidden     Immunizations and Health Maintenance Immunization History  Administered Date(s) Administered  . Influenza Whole 06/09/2010, 04/26/2012  . Influenza,inj,Quad PF,6+ Mos 04/13/2014, 04/26/2016, 05/14/2017  . Influenza-Unspecified 05/30/2015  . Td 06/09/2010  . Tdap 11/18/2011   There are no preventive care reminders to display for this patient.  Patient Care Team: [name], MD as PCP - General (Internal Medicine) Ladell Pier, MD as Consulting Physician (Oncology) [name], MD as Consulting Physician (Cardiology) Deneise Lever, MD as Consulting Physician (Pulmonary Disease)  Indicate any recent Medical Services you may have received from other than [facility] providers in the past year (date may be approximate).    Assessment:   This is a routine wellness examination for Elsie. Physical assessment deferred to PCP.   Hearing/Vision screen Hearing Screening Comments: Able to hear conversational tones w/o difficulty. No issues reported.  Passed whisper test Vision Screening Comments: appointment yearly   Dietary issues and exercise activities discussed: Current Exercise Habits: Home exercise routine, Type of exercise: strength training/weights, Time (Minutes): 40, Frequency (Times/Week): 4, Weekly Exercise (Minutes/Week): 160, Intensity: Mild Diet (meal preparation, eat out, water intake, caffeinated beverages, dairy products, fruits and vegetables): in general, a "healthy" diet  , well balanced, eats a variety of fruits and vegetables daily,  limits salt, fat/cholesterol, sugar,carbohydrates,caffeine, drinks 6-8 glasses of water daily.  Goals    . Patient Stated     I want to maintain a steady routine, continue to be as healthy and as independent as possible. Enjoy life and family.      Depression Screen PHQ 2/9 Scores 02/11/2018 03/24/2017 01/08/2017 02/03/2014  PHQ - 2 Score 1  0 0 0  PHQ- 9 Score 5 - 0 -    Fall Risk Fall Risk  02/11/2018 01/08/2017 11/26/2016 02/14/2014 02/03/2014  Falls in the past year? No No No No No   Cognitive Function:       Ad8 score reviewed for issues:  Issues making decisions: no  Less interest in hobbies / activities: no  Repeats questions, stories (family complaining): no  Trouble using ordinary gadgets (microwave, computer, phone):no  Forgets the month or year: no  Mismanaging finances: no  Remembering appts: no  Daily problems with thinking and/or memory: no Ad8 score is= 0  Screening Tests Health Maintenance  Topic Date Due  . INFLUENZA VACCINE  03/05/2018  . TETANUS/TDAP  11/17/2021  . COLONOSCOPY  08/05/2024  . Hepatitis C Screening  Completed  . HIV Screening  Completed      Plan:     Continue doing brain stimulating activities (puzzles, reading, adult coloring books, staying active) to keep memory sharp.   .Continue to eat heart healthy diet (full of fruits, vegetables, whole grains, lean protein, water--limit salt, fat, and sugar intake) and increase physical activity as tolerated.  I have personally reviewed and noted the following in the patient's chart:   . Medical and social history . Use of alcohol, tobacco or illicit drugs  . Current medications and supplements . Functional ability and status . Nutritional status . Physical activity . Advanced directives . List of other physicians . Vitals . Screenings to include cognitive, depression, and falls . Referrals and appointments  In addition, I have reviewed and discussed with patient certain preventive  protocols, quality metrics, and best practice recommendations. A written personalized care plan for preventive services as well as general preventive health recommendations were provided to patient.     Michiel Cowboy, RN   02/11/2018    Medical screening examination/treatment/procedure(s) were performed by non-physician practitioner and as supervising physician I was immediately available for consultation/collaboration. I agree with above. Cathlean Cower, MD

## 2018-02-11 NOTE — Assessment & Plan Note (Signed)
stable overall by history and exam, recent data reviewed with pt, and pt to continue medical treatment as before,  to f/u any worsening symptoms or concerns Lab Results  Component Value Date   LDLCALC 71 01/08/2017

## 2018-02-11 NOTE — Assessment & Plan Note (Signed)
Also for psa f/u, asympt

## 2018-02-11 NOTE — Progress Notes (Signed)
Subjective:    Patient ID: Ronald Thompson, male    DOB: [birth year], 62 y.o.   MRN: [id number]   HPI  Here for yearly f/u;  Overall doing ok;  Pt denies Chest pain, worsening SOB, DOE, wheezing, orthopnea, PND, worsening LE edema, palpitations, dizziness or syncope.  Pt denies neurological change such as new headache, facial or extremity weakness.  Pt denies polydipsia, polyuria, or low sugar symptoms. Pt states overall good compliance with treatment and medications, good tolerability, and has been trying to follow appropriate diet.  Pt denies worsening depressive symptoms, suicidal ideation or panic. No fever, night sweats, loss of appetite, or other constitutional symptoms.  Pt states good ability with ADL's, has low fall risk, home safety reviewed and adequate, no other significant changes in hearing or vision, and only occasionally active with exercise.  Cont;s to f/u with every 3 mo labs and oncology f/u.  Denies urinary symptoms such as dysuria, frequency, urgency, flank pain, hematuria or n/v, fever, chills.  Bp at home usually 120 and 130's, stressed today;  Has ongoing ED symptoms worse in the past yr BP Readings from Last 3 Encounters:  02/11/18 (!) 158/98  11/27/17 136/81  10/29/17 (!) 153/77   Wt Readings from Last 3 Encounters:  02/11/18 187 lb (84.8 kg)  10/29/17 185 lb 11.2 oz (84.2 kg)  10/02/17 190 lb (86.2 kg)   Past Medical History:  Diagnosis Date  . Baker's cyst   . Gallstones   . Heart murmur   . Hypercholesteremia   . Hypertension   . Lesion of left lung    hx.25 yrs ago- testicilar cancer related- only scarring left after tx.-no problems now  . Liver metastasis ([place])   . 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  . Pericarditis    2nd to tumor  . Pneumonia 1990  . Primary neuroendocrine tumor of pancreas 02/14/2014   Stage IV with multiple mets to liver  . Pulmonary fibrosis ([place])  11/08/2015  . S/P TAVR (transcatheter aortic valve replacement) 04/16/2016   26 mm Edwards Sapien 3 transcatheter heart valve placed via percutaneous right transfemoral approach   . Shortness of breath dyspnea    with exertion and when tired  . 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 Transfusion history    hx. 25 yrs ago-during cancer tx.   Past Surgical History:  Procedure Laterality Date  . BACK SURGERY     '14- rupt. disc   . CARDIAC CATHETERIZATION N/A 01/24/2016   Procedure: Right/Left Heart Cath and Coronary Angiography;  Surgeon: [name], MD;  Location: [facility];  Service: Cardiovascular;  Laterality: N/A;  . COLONOSCOPY    . EUS N/A 02/10/2014   Procedure: UPPER ENDOSCOPIC ULTRASOUND (EUS) LINEAR;  Surgeon: [name], MD;  Location: [facility];  Service: Endoscopy;  Laterality: N/A;  . IR RADIOLOGIST EVAL & MGMT  02/11/2017  . KNEE SURGERY Right    age 53 for Baker's cyst  . NASAL SEPTUM SURGERY     Deviated septrum  . TEE WITHOUT CARDIOVERSION N/A 04/16/2016   Procedure: TRANSESOPHAGEAL ECHOCARDIOGRAM (TEE);  Surgeon: [name], MD;  Location: [facility];  Service: Open Heart Surgery;  Laterality: N/A;  . THORACOTOMY  1990   wedge biopsy of mediastinal mass  . TRANSCATHETER AORTIC VALVE REPLACEMENT, TRANSFEMORAL N/A 04/16/2016   Procedure:  TRANSCATHETER AORTIC VALVE REPLACEMENT, TRANSFEMORAL;  Surgeon: [name], MD;  Location: [facility];  Service: Open Heart Surgery;  Laterality: N/A;    reports that he has never smoked. He has never used smokeless tobacco. He reports that he drinks alcohol. He reports that he does not use drugs. family history includes Cancer in his maternal aunt and paternal grandfather; Dementia in his mother; Emphysema in his maternal aunt. Allergies  Allergen Reactions  . Penicillins Hives    ENTIRE BODY Has patient had a PCN reaction causing immediate rash,  facial/tongue/throat swelling, SOB or lightheadedness with hypotension: No Has patient had a PCN reaction causing severe rash involving mucus membranes or skin necrosis: No Has patient had a PCN reaction that required hospitalization * *  YES  * * Has patient had a PCN reaction occurring within the last 10 years: No If all of the above answers are "NO", then may proceed with Cephalosporin use. *reaction occurred when he was 17   Current Outpatient Medications on File Prior to Visit  Medication Sig Dispense Refill  . acetaminophen (TYLENOL) 500 MG tablet Take 1,000 mg by mouth every 6 (six) hours as needed for headache.    . albuterol (PROVENTIL HFA;VENTOLIN HFA) 108 (90 Base) MCG/ACT inhaler Inhale 1-2 puffs into the lungs every 6 (six) hours as needed for wheezing or shortness of breath. 1 Inhaler 0  . aspirin EC 81 MG tablet Take 81 mg by mouth every evening.    . calcium carbonate (TUMS - DOSED IN MG ELEMENTAL CALCIUM) 500 MG chewable tablet Chew 3 tablets by mouth 3 (three) times daily as needed for indigestion or heartburn.     . clindamycin (CLEOCIN) 300 MG capsule Take 2 capsules by mouth one hour prior to dental appointment 6 capsule 2  . docusate sodium (COLACE) 100 MG capsule Take 100 mg by mouth daily as needed for mild constipation.    . gabapentin (NEURONTIN) 100 MG capsule Take 1 capsule (100 mg total) by mouth 3 (three) times daily. 21 capsule 0  . gabapentin (NEURONTIN) 300 MG capsule Take 1 capsule (300 mg total) by mouth 3 (three) times daily. 90 capsule 3  . ibuprofen (ADVIL,MOTRIN) 200 MG tablet Take 400 mg by mouth every 6 (six) hours as needed for headache or mild pain.     Marland Kitchen losartan (COZAAR) 50 MG tablet Take 1 tablet (50 mg total) by mouth daily. 90 tablet 3  . Octreotide Acetate (SANDOSTATIN IJ) Inject 1 each as directed every 30 (thirty) days.     Marland Kitchen oxyCODONE (OXY IR/ROXICODONE) 5 MG immediate release tablet 1-2 tabs PO Q 6 hours PRN pain. 45 tablet 0  .  sennosides-docusate sodium (SENOKOT-S) 8.6-50 MG tablet Take 1 tablet by mouth as needed for constipation.    . simvastatin (ZOCOR) 20 MG tablet TAKE ONE TABLET BY MOUTH DAILY AT 6 PM 90 tablet 3   Current Facility-Administered Medications on File Prior to Visit  Medication Dose Route Frequency Provider Last Rate Last Dose  . heparin lock flush 100 unit/mL  500 Units Intravenous Once Betsy Coder B, MD      . sodium chloride flush (NS) 0.9 % injection 10 mL  10 mL Intracatheter PRN Ladell Pier, MD       Review of Systems Constitutional: Negative for other unusual diaphoresis, sweats, appetite or weight changes HENT: Negative for other worsening hearing loss, ear pain, facial swelling, mouth sores or neck stiffness.   Eyes: Negative for other worsening pain, redness or  other visual disturbance.  Respiratory: Negative for other stridor or swelling Cardiovascular: Negative for other palpitations or other chest pain  Gastrointestinal: Negative for worsening diarrhea or loose stools, blood in stool, distention or other pain Genitourinary: Negative for hematuria, flank pain or other change in urine volume.  Musculoskeletal: Negative for myalgias or other joint swelling.  Skin: Negative for other color change, or other wound or worsening drainage.  Neurological: Negative for other syncope or numbness. Hematological: Negative for other adenopathy or swelling Psychiatric/Behavioral: Negative for hallucinations, other worsening agitation, SI, self-injury, or new decreased concentration All other system neg per pt    Objective:   Physical Exam BP (!) 158/98   Pulse 88   Temp 98.8 F (37.1 C) (Oral)   Ht 5\' 10"  (1.778 m)   Wt 187 lb (84.8 kg)   SpO2 98%   BMI 26.83 kg/m  VS noted,  Constitutional: Pt appears in NAD HENT: Head: NCAT.  Right Ear: External ear normal.  Left Ear: External ear normal.  Eyes: . Pupils are equal, round, and reactive to light. Conjunctivae and EOM are  normal Nose: without d/c or deformity Neck: Neck supple. Gross normal ROM Cardiovascular: Normal rate and regular rhythm.   Pulmonary/Chest: Effort normal and breath sounds without rales or wheezing.  Abd:  Soft, NT, ND, + BS, no organomegaly Neurological: Pt is alert. At baseline orientation, motor grossly intact Skin: Skin is warm. No rashes, other new lesions, no LE edema Psychiatric: Pt behavior is normal without agitation  No other exam findings  Lab Results  Component Value Date   WBC 3.9 (L) 10/29/2017   HGB 14.6 10/29/2017   HCT 42.6 10/29/2017   PLT 118 (L) 10/29/2017   GLUCOSE 148 (H) 10/29/2017   CHOL 131 01/08/2017   TRIG 56.0 01/08/2017   HDL 48.80 01/08/2017   LDLDIRECT 139.2 10/10/2008   LDLCALC 71 01/08/2017   ALT 23 10/29/2017   AST 24 10/29/2017   NA 139 10/29/2017   K 5.0 10/29/2017   CL 102 10/29/2017   CREATININE 1.01 10/29/2017   BUN 19 10/29/2017   CO2 29 10/29/2017   TSH 0.97 01/08/2017   PSA 1.98 01/08/2017   INR 1.30 04/16/2016   HGBA1C 6.2 01/08/2017       Assessment & Plan:

## 2018-02-11 NOTE — Patient Instructions (Addendum)
Continue doing brain stimulating activities (puzzles, reading, adult coloring books, staying active) to keep memory sharp.   .Continue to eat heart healthy diet (full of fruits, vegetables, whole grains, lean protein, water--limit salt, fat, and sugar intake) and increase physical activity as tolerated.   Ronald Thompson , Thank you for taking time to come for your Medicare Wellness Visit. I appreciate your ongoing commitment to your health goals. Please review the following plan we discussed and let me know if I can assist you in the future.   These are the goals we discussed: Goals    . Patient Stated     I want to maintain a steady routine, continue to be as healthy and as independent as possible. Enjoy life and family.       This is a list of the screening recommended for you and due dates:  Health Maintenance  Topic Date Due  . Flu Shot  03/05/2018  . Tetanus Vaccine  11/17/2021  . Colon Cancer Screening  08/05/2024  .  Hepatitis C: One time screening is recommended by Center for Disease Control  (CDC) for  adults born from [birth year] through 1967.   Completed  . HIV Screening  Completed

## 2018-02-11 NOTE — Assessment & Plan Note (Signed)
For viagra prn,  to f/u any worsening symptoms or concerns 

## 2018-02-11 NOTE — Patient Instructions (Addendum)
Your shingles shot prescription was sent to Ronald Thompson  Please take all new medication as prescribed - the viagra as needed  Please continue all other medications as before, and refills have been done if requested.  Please have the pharmacy call with any other refills you may need.  Please continue your efforts at being more active, low cholesterol diet, and weight control.  You are otherwise up to date with prevention measures today.  Please keep your appointments with your specialists as you may have planned  Please go to the LAB in the Basement (turn left off the elevator) for the tests to be done today  You will be contacted by phone if any changes need to be made immediately.  Otherwise, you will receive a letter about your results with an explanation, but please check with MyChart first.  Please remember to sign up for MyChart if you have not done so, as this will be important to you in the future with finding out test results, communicating by private email, and scheduling acute appointments online when needed.  Please return in 6 months, or sooner if needed

## 2018-02-11 NOTE — Assessment & Plan Note (Signed)
stable overall by history and exam, recent data reviewed with pt, and pt to continue medical treatment as before,  to f/u any worsening symptoms or concerns Lab Results  Component Value Date   HGBA1C 6.2 01/08/2017

## 2018-02-11 NOTE — Assessment & Plan Note (Signed)
stable overall by history and exam, recent data reviewed with pt, and pt to continue medical treatment as before,  to f/u any worsening symptoms or concerns  

## 2018-02-12 ENCOUNTER — Encounter: Payer: Self-pay | Admitting: Internal Medicine

## 2018-02-12 LAB — LIPID PANEL
Cholesterol: 157 mg/dL (ref 0–200)
HDL: 51.3 mg/dL
LDL Cholesterol: 86 mg/dL (ref 0–99)
NonHDL: 105.36
Total CHOL/HDL Ratio: 3
Triglycerides: 96 mg/dL (ref 0.0–149.0)
VLDL: 19.2 mg/dL (ref 0.0–40.0)

## 2018-02-12 LAB — TSH: TSH: 1.38 u[IU]/mL (ref 0.35–4.50)

## 2018-02-12 LAB — PSA: PSA: 0.98 ng/mL (ref 0.10–4.00)

## 2018-02-25 ENCOUNTER — Other Ambulatory Visit: Payer: Self-pay | Admitting: Emergency Medicine

## 2018-02-25 DIAGNOSIS — D3A8 Other benign neuroendocrine tumors: Secondary | ICD-10-CM

## 2018-02-26 ENCOUNTER — Telehealth: Payer: Self-pay | Admitting: Oncology

## 2018-02-26 ENCOUNTER — Inpatient Hospital Stay: Payer: Medicare Other | Attending: Oncology

## 2018-02-26 ENCOUNTER — Inpatient Hospital Stay (HOSPITAL_BASED_OUTPATIENT_CLINIC_OR_DEPARTMENT_OTHER): Payer: Medicare Other | Admitting: Oncology

## 2018-02-26 ENCOUNTER — Inpatient Hospital Stay: Payer: Medicare Other

## 2018-02-26 VITALS — BP 136/74 | HR 75 | Temp 97.8°F | Resp 18 | Ht 70.0 in | Wt 184.1 lb

## 2018-02-26 DIAGNOSIS — R0609 Other forms of dyspnea: Secondary | ICD-10-CM | POA: Diagnosis not present

## 2018-02-26 DIAGNOSIS — C7B8 Other secondary neuroendocrine tumors: Secondary | ICD-10-CM | POA: Insufficient documentation

## 2018-02-26 DIAGNOSIS — Z86718 Personal history of other venous thrombosis and embolism: Secondary | ICD-10-CM | POA: Insufficient documentation

## 2018-02-26 DIAGNOSIS — C7A8 Other malignant neuroendocrine tumors: Secondary | ICD-10-CM | POA: Insufficient documentation

## 2018-02-26 DIAGNOSIS — D3A8 Other benign neuroendocrine tumors: Secondary | ICD-10-CM

## 2018-02-26 LAB — CMP (CANCER CENTER ONLY)
ALK PHOS: 75 U/L (ref 38–126)
ALT: 25 U/L (ref 0–44)
AST: 29 U/L (ref 15–41)
Albumin: 4.3 g/dL (ref 3.5–5.0)
Anion gap: 10 (ref 5–15)
BILIRUBIN TOTAL: 1 mg/dL (ref 0.3–1.2)
BUN: 21 mg/dL (ref 8–23)
CALCIUM: 9.5 mg/dL (ref 8.9–10.3)
CHLORIDE: 99 mmol/L (ref 98–111)
CO2: 29 mmol/L (ref 22–32)
CREATININE: 1.13 mg/dL (ref 0.61–1.24)
Glucose, Bld: 181 mg/dL — ABNORMAL HIGH (ref 70–99)
Potassium: 4.3 mmol/L (ref 3.5–5.1)
Sodium: 138 mmol/L (ref 135–145)
Total Protein: 7.1 g/dL (ref 6.5–8.1)

## 2018-02-26 LAB — CBC WITH DIFFERENTIAL (CANCER CENTER ONLY)
Basophils Absolute: 0 10*3/uL (ref 0.0–0.1)
Basophils Relative: 0 %
EOS ABS: 0 10*3/uL (ref 0.0–0.5)
EOS PCT: 1 %
HCT: 42.1 % (ref 38.4–49.9)
HEMOGLOBIN: 14.2 g/dL (ref 13.0–17.1)
LYMPHS ABS: 0.3 10*3/uL — AB (ref 0.9–3.3)
Lymphocytes Relative: 7 %
MCH: 33.4 pg (ref 27.2–33.4)
MCHC: 33.8 g/dL (ref 32.0–36.0)
MCV: 98.8 fL — ABNORMAL HIGH (ref 79.3–98.0)
Monocytes Absolute: 0.5 10*3/uL (ref 0.1–0.9)
Monocytes Relative: 10 %
NEUTROS PCT: 82 %
Neutro Abs: 3.8 10*3/uL (ref 1.5–6.5)
PLATELETS: 157 10*3/uL (ref 140–400)
RBC: 4.26 MIL/uL (ref 4.20–5.82)
RDW: 13 % (ref 11.0–14.6)
WBC: 4.7 10*3/uL (ref 4.0–10.3)

## 2018-02-26 MED ORDER — OCTREOTIDE ACETATE 30 MG IM KIT
PACK | INTRAMUSCULAR | Status: AC
  Filled 2018-02-26: qty 1

## 2018-02-26 MED ORDER — OCTREOTIDE ACETATE 30 MG IM KIT
30.0000 mg | PACK | Freq: Once | INTRAMUSCULAR | Status: AC
  Administered 2018-02-26: 30 mg via INTRAMUSCULAR

## 2018-02-26 NOTE — Telephone Encounter (Signed)
Scheduled appt per 7/25 los - gave patient AVS and calender per los.  

## 2018-02-26 NOTE — Telephone Encounter (Signed)
Injections moved out one week per pt - first injection scheduled in 5 weeks instead of 4

## 2018-02-26 NOTE — Progress Notes (Signed)
[facility] OFFICE PROGRESS NOTE   Diagnosis: Pancreas neuroendocrine tumor  INTERVAL HISTORY:   Ronald Thompson returns as scheduled.  He reports resolution of the tingling.  He was evaluated by neurosurgery.  He reports no specific treatment was recommended.  He continues monthly Sandostatin.  No diarrhea or flushing.  Good appetite.  Stable exertional dyspnea.   Objective:  Vital signs in last 24 hours:  Blood pressure 136/74, pulse 75, temperature 97.8 F (36.6 C), temperature source Oral, resp. rate 18, height 5\' 10"  (1.778 m), weight 184 lb 1.6 oz (83.5 kg), SpO2 96 %.   Resp: Decreased breath sounds with scattered inspiratory/expiratory rhonchi throughout the left chest, no respiratory distress Cardio: Regular rate and rhythm GI: No hepatomegaly, nontender, no mass Vascular: No leg edema     Lab Results:  Lab Results  Component Value Date   WBC 4.7 02/26/2018   HGB 14.2 02/26/2018   HCT 42.1 02/26/2018   MCV 98.8 (H) 02/26/2018   PLT 157 02/26/2018   NEUTROABS 3.8 02/26/2018    CMP  Lab Results  Component Value Date   NA 138 02/26/2018   K 4.3 02/26/2018   CL 99 02/26/2018   CO2 29 02/26/2018   GLUCOSE 181 (H) 02/26/2018   BUN 21 02/26/2018   CREATININE 1.13 02/26/2018   CALCIUM 9.5 02/26/2018   PROT 7.1 02/26/2018   ALBUMIN 4.3 02/26/2018   AST 29 02/26/2018   ALT 25 02/26/2018   ALKPHOS 75 02/26/2018   BILITOT 1.0 02/26/2018   GFRNONAA >60 02/26/2018   GFRAA >60 02/26/2018    Medications: I have reviewed the patient's current medications.   Assessment/Plan: 1. Pancreatic neuroendocrine tumor, WHO grade 2, pancreatic head mass and small peripancreatic/celiac nodes on a CT 02/04/2014  EUS revealed evidence of multiple liver metastases-status post an FNA biopsy of a left liver lesion 02/10/2014 confirming a neuroendocrine tumor   Octreotide scan 04/01/2014 with multiple foci of metastatic neuroendocrine tumor in the liver  Monthly  Sandostatin started 03/16/2014  Restaging CT 07/04/2014 with resolution of a previously noted pancreas head lesion and probable progression of liver metastases  Restaging CT 10/31/2014-stable liver lesions, no evidence of a pancreas mass, stable.  Restaging CT 02/28/2015-stable hepatic metastases, stable pancreas lesion unchanged  Restaging CT 08/30/2015-stable pancreas mass, slight enlargement of hepatic metastases  Monthly Sandostatin continued  Restaging CTs06/14/2017 revealed enlargement of liver lesions, no new lesions, enlargement of the pancreas head mass  Gallium DOTATATEscan 10/01/2016 confirmed uptake in the liver metastases, pancreas primary, peripancreatic adenopathy, and left iliac bone  Cycle 1 Lutathera 03/05/2017  Cycle 2Lutathera09/26/2018  Cycle 3 Lutathera 07/02/2017  Cycle 4 Lutathera 08/27/2017  Netspotscan 09/29/2017-decreased radiotracer activity within the pancreas head, peripancreatic lymph node, and solitary skeletal lesion. Liver lesions have increased in size with a decrease in radiotracer activity  Monthly Sandostatin continued  2. Chest Seminoma in 1990 treated with BEP and chest radiation at [facility] 3. 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 chronic exertional dyspnea following treatment for the seminoma  5. aortic stenosis on an echocardiogram December 2011, severe aortic stenosis on echocardiogram 12/06/2015, status postTAVR on 04/16/2016 6. lumbar disc surgery 2014  7. acute abdominal pain 02/04/2014-resolved   Disposition: Ronald Thompson appears stable.  He will continue monthly Sandostatin.  He will be scheduled for a restaging Netspot scan and office visit in October.  15 minutes  were spent with the patient today.  The majority of the time was used for counseling and coordination of  care.  Betsy Coder, MD  02/26/2018  10:58 AM

## 2018-03-10 ENCOUNTER — Ambulatory Visit: Payer: Medicare Other | Admitting: Physician Assistant

## 2018-03-26 ENCOUNTER — Other Ambulatory Visit: Payer: Self-pay | Admitting: Internal Medicine

## 2018-04-01 ENCOUNTER — Inpatient Hospital Stay: Payer: Medicare Other | Attending: Oncology

## 2018-04-01 DIAGNOSIS — D3A8 Other benign neuroendocrine tumors: Secondary | ICD-10-CM

## 2018-04-01 DIAGNOSIS — C7B8 Other secondary neuroendocrine tumors: Secondary | ICD-10-CM | POA: Diagnosis not present

## 2018-04-01 DIAGNOSIS — C7A8 Other malignant neuroendocrine tumors: Secondary | ICD-10-CM | POA: Insufficient documentation

## 2018-04-01 MED ORDER — OCTREOTIDE ACETATE 30 MG IM KIT
30.0000 mg | PACK | Freq: Once | INTRAMUSCULAR | Status: AC
  Administered 2018-04-01: 30 mg via INTRAMUSCULAR

## 2018-04-01 MED ORDER — OCTREOTIDE ACETATE 30 MG IM KIT
PACK | INTRAMUSCULAR | Status: AC
Start: 2018-04-01 — End: ?
  Filled 2018-04-01: qty 1

## 2018-04-01 NOTE — Patient Instructions (Signed)

## 2018-04-01 NOTE — Progress Notes (Signed)
Pt. Tolerated injection well, No further problems or concerns noted. 

## 2018-04-06 ENCOUNTER — Other Ambulatory Visit: Payer: Self-pay | Admitting: Internal Medicine

## 2018-04-23 ENCOUNTER — Encounter: Payer: Self-pay | Admitting: Cardiovascular Disease

## 2018-04-30 ENCOUNTER — Inpatient Hospital Stay: Payer: Medicare Other | Attending: Oncology

## 2018-04-30 VITALS — BP 135/81 | HR 79 | Temp 98.4°F | Resp 18

## 2018-04-30 DIAGNOSIS — D3A8 Other benign neuroendocrine tumors: Secondary | ICD-10-CM

## 2018-04-30 DIAGNOSIS — C7B8 Other secondary neuroendocrine tumors: Secondary | ICD-10-CM | POA: Diagnosis not present

## 2018-04-30 DIAGNOSIS — C7A8 Other malignant neuroendocrine tumors: Secondary | ICD-10-CM | POA: Diagnosis not present

## 2018-04-30 MED ORDER — OCTREOTIDE ACETATE 30 MG IM KIT
30.0000 mg | PACK | Freq: Once | INTRAMUSCULAR | Status: AC
  Administered 2018-04-30: 30 mg via INTRAMUSCULAR

## 2018-04-30 MED ORDER — OCTREOTIDE ACETATE 30 MG IM KIT
PACK | INTRAMUSCULAR | Status: AC
Start: 2018-04-30 — End: ?
  Filled 2018-04-30: qty 1

## 2018-04-30 NOTE — Patient Instructions (Signed)

## 2018-05-05 ENCOUNTER — Ambulatory Visit (INDEPENDENT_AMBULATORY_CARE_PROVIDER_SITE_OTHER): Payer: Medicare Other | Admitting: Cardiovascular Disease

## 2018-05-05 ENCOUNTER — Encounter: Payer: Self-pay | Admitting: Cardiovascular Disease

## 2018-05-05 VITALS — BP 144/90 | HR 76 | Ht 70.0 in | Wt 192.0 lb

## 2018-05-05 DIAGNOSIS — Z952 Presence of prosthetic heart valve: Secondary | ICD-10-CM

## 2018-05-05 DIAGNOSIS — I350 Nonrheumatic aortic (valve) stenosis: Secondary | ICD-10-CM

## 2018-05-05 NOTE — Patient Instructions (Addendum)
Medication Instructions:  Your physician has recommended you make the following change in your medication:  1. STOP Aspirin  * If you need a refill on your cardiac medications before your next appointment, please call your pharmacy.   Labwork: None ordered  Testing/Procedures: None ordered  Follow-Up: Your physician wants you to follow-up in: 6 months with Dr. Acie Fredrickson.  You will receive a reminder letter in the mail two months in advance. If you don't receive a letter, please call our office to schedule the follow-up appointment.   Thank you for choosing [facility]!!

## 2018-05-05 NOTE — Progress Notes (Signed)
Cardiology Office Note   Date:  05/05/2018   ID:  Ronald Thompson, DOB [birth year], MRN [id number]  PCP:  [name], MD  Cardiologist:   Mertie Moores, MD   Chief Complaint  Patient presents with  . Aortic Stenosis   Problem list 1. Aortic stenosis - likely had a bicuspid AV  2. Essential hypertension 3. Permanent fibrosis 4. Hyperlipidemia 5. Metastatic seminoma - to his chest ,  S/p chemo and XRT  Took VP 16 - caused some pulmonary fibrosis  6. Neuroendocrine tumur - Pancreatic cancer with mets to Liver .    Notes from 2017:  Ronald Thompson is a 62 y.o. male who presents for evaluation of his aortic stenosis. He has a history of metastatic seminoma up to his chest. He received high-dose chemotherapy and XRT .   It also accompanied some of his heart and extended down to the diaphragm.  2 years ago he was diagnosed with a neuroendocrine tumor-likely to be pancreatic cancer.  Gets saldostatin monthly .  He has some metastases to his liver.   He sees [name]  Has been told that there is no cure, but the growth rate has been slowed dramaticlly .    Has had dyspnea for the past 25 years- since the seminoma was treated .  Has significant DOE but he is able to lift weights.  Does have DOE with walking up hills or climbing stairs.  Does ok on level ground .   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 He had TAVR on April 16, 2016.  He is overall done very well. He has a complex medical history including metastatic pancreatic neuroendocrine tumor.  Still has DOE. And fatigue  Has pulmonary fibrosis from the chemo ( VP 16, bleomycin, radiation therapy)   Still working through the neuroendocrine tumor PET scan in several week  No CP  Does not jog. Works  [employer] on a level surface without difficulities.   BP is typically well controlled.    Past Medical History:  Diagnosis  Date  . Baker's cyst   . Gallstones   . Heart murmur   . Hypercholesteremia   . Hypertension   . Lesion of left lung    hx.25 yrs ago- testicilar cancer related- only scarring left after tx.-no problems now  . Liver metastasis ([place])   . 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  . Pericarditis    2nd to tumor  . Pneumonia 1990  . Primary neuroendocrine tumor of pancreas 02/14/2014   Stage IV with multiple mets to liver  . Pulmonary fibrosis ([place]) 11/08/2015  . S/P TAVR (transcatheter aortic valve replacement) 04/16/2016   26 mm Edwards Sapien 3 transcatheter heart valve placed via percutaneous right transfemoral approach   . Shortness of breath dyspnea    with exertion and when tired  . 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 Transfusion history    hx. 25 yrs ago-during cancer tx.    Past Surgical History:  Procedure Laterality Date  . BACK SURGERY     '14- rupt. disc   . CARDIAC CATHETERIZATION N/A 01/24/2016   Procedure: Right/Left Heart Cath and Coronary Angiography;  Surgeon: [name], MD;  Location: [facility];  Service: Cardiovascular;  Laterality: N/A;  . COLONOSCOPY    . EUS N/A 02/10/2014   Procedure: UPPER ENDOSCOPIC ULTRASOUND (EUS) LINEAR;  Surgeon: [name], MD;  Location: [facility];  Service: Endoscopy;  Laterality: N/A;  . IR RADIOLOGIST EVAL & MGMT  02/11/2017  . KNEE SURGERY Right    age 11 for Baker's cyst  . NASAL SEPTUM SURGERY     Deviated septrum  . TEE WITHOUT CARDIOVERSION N/A 04/16/2016   Procedure: TRANSESOPHAGEAL ECHOCARDIOGRAM (TEE);  Surgeon: [name], MD;  Location: [facility];  Service: Open Heart Surgery;  Laterality: N/A;  . THORACOTOMY  1990   wedge biopsy of mediastinal mass  . TRANSCATHETER AORTIC VALVE REPLACEMENT, TRANSFEMORAL N/A 04/16/2016   Procedure:  TRANSCATHETER AORTIC VALVE REPLACEMENT, TRANSFEMORAL;  Surgeon: [name], MD;  Location: [facility];  Service: Open Heart Surgery;  Laterality: N/A;     Current Outpatient Medications  Medication Sig Dispense Refill  . acetaminophen (TYLENOL) 500 MG tablet Take 1,000 mg by mouth every 6 (six) hours as needed for headache.    . albuterol (PROVENTIL HFA;VENTOLIN HFA) 108 (90 Base) MCG/ACT inhaler Inhale 1-2 puffs into the lungs every 6 (six) hours as needed for wheezing or shortness of breath. 1 Inhaler 0  . calcium carbonate (TUMS - DOSED IN MG ELEMENTAL CALCIUM) 500 MG chewable tablet Chew 3 tablets by mouth 3 (three) times daily as needed for indigestion or heartburn.     . clindamycin (CLEOCIN) 300 MG capsule Take 2 capsules by mouth one hour prior to dental appointment 6 capsule 2  . docusate sodium (COLACE) 100 MG capsule Take 100 mg by mouth daily as needed for mild constipation.    Marland Kitchen ibuprofen (ADVIL,MOTRIN) 200 MG tablet Take 400 mg by mouth every 6 (six) hours as needed for headache or mild pain.     Marland Kitchen losartan (COZAAR) 50 MG tablet TAKE 1 TABLET BY MOUTH DAILY 90 tablet 2  . Octreotide Acetate (SANDOSTATIN IJ) Inject 1 each as directed every 30 (thirty) days.     . sennosides-docusate sodium (SENOKOT-S) 8.6-50 MG tablet Take 1 tablet by mouth as needed for constipation.    . sildenafil (VIAGRA) 100 MG tablet Take 0.5-1 tablets (50-100 mg total) by mouth daily as needed for erectile dysfunction. 5 tablet 11  . simvastatin (ZOCOR) 20 MG tablet TAKE ONE TABLET BY MOUTH DAILY AT 6 PM 90 tablet 2   No current facility-administered medications for this visit.    Facility-Administered Medications Ordered in Other Visits  Medication Dose Route Frequency Provider Last Rate Last Dose  . heparin lock flush 100 unit/mL  500 Units Intravenous Once Betsy Coder B, MD      . sodium chloride flush (NS) 0.9 % injection 10 mL  10 mL Intracatheter PRN Ladell Pier, MD        Allergies:    Penicillins    Social History:  The patient  reports that he has never smoked. He has never used smokeless tobacco. He reports that he drinks alcohol. He reports that he does not use drugs.   Family History:  The patient's family history includes Cancer in his maternal aunt and paternal grandfather; Dementia in his mother; Emphysema in his maternal aunt.    ROS:  Please see the history of present illness.      Physical Exam: Blood pressure (!) 144/90, pulse 76, height 5\' 10"  (1.778 m), weight 192 lb (87.1 kg), SpO2 94 %.  GEN:  Well nourished, well  developed in no acute distress HEENT: Normal NECK: No JVD; No carotid bruits LYMPHATICS: No lymphadenopathy CARDIAC: RR , soft systolic and soft diastolic murmur  RESPIRATORY:  Clear to auscultation without rales, wheezing or rhonchi  ABDOMEN: Soft, non-tender, non-distended MUSCULOSKELETAL:  No edema; No deformity  SKIN: Warm and dry NEUROLOGIC:  Alert and oriented x 3   EKG: May 05, 2018: Normal sinus rhythm at 76.  Right atrial enlargement.   Recent Labs: 02/11/2018: TSH 1.38 02/26/2018: ALT 25; BUN 21; Creatinine 1.13; Hemoglobin 14.2; Platelet Count 157; Potassium 4.3; Sodium 138    Lipid Panel    Component Value Date/Time   CHOL 157 02/11/2018 1658   TRIG 96.0 02/11/2018 1658   HDL 51.30 02/11/2018 1658   CHOLHDL 3 02/11/2018 1658   VLDL 19.2 02/11/2018 1658   LDLCALC 86 02/11/2018 1658   LDLDIRECT 139.2 10/10/2008 0800      Wt Readings from Last 3 Encounters:  05/05/18 192 lb (87.1 kg)  02/26/18 184 lb 1.6 oz (83.5 kg)  02/11/18 187 lb (84.8 kg)      Other studies Reviewed: Additional studies/ records that were reviewed today include: . Review of the above records demonstrates:    ASSESSMENT AND PLAN:  1.  Aortic stenosis:    s/p TAVR.   Doing well.  Still  Very short of breath  - likely due to pulm HTN from his pulmonary fibrosis   2.  Hypertension: Blood pressure is a little bit elevated today.   He is on losartan 50 mg a day.  He will keep a blood pressure log and we will see him again in 6 months to decide if he needs additional blood pressure medications.  I'll see him in 6 months.   Current medicines are reviewed at length with the patient today.  The patient does not have concerns regarding medicines.  The following changes have been made:  no change  Labs/ tests ordered today include:   Orders Placed This Encounter  Procedures  . EKG 12-Lead       Mertie Moores, MD  05/05/2018 10:59 AM    [facility] [address], [address] [place], [place]  [address] Phone: [phone]; Fax: [phone]

## 2018-05-07 ENCOUNTER — Telehealth: Payer: Self-pay | Admitting: Oncology

## 2018-05-07 NOTE — Telephone Encounter (Signed)
Per [facility]BS changed 10/24 f/u time to 8:30 am due to PM PAL - lab/inj adjusted accordingly. Left message for patient. Schedule mailed.

## 2018-05-18 ENCOUNTER — Telehealth: Payer: Self-pay | Admitting: Internal Medicine

## 2018-05-18 MED ORDER — LOSARTAN POTASSIUM 100 MG PO TABS: 100.0000 mg | ORAL_TABLET | Freq: Every day | ORAL | 3 refills | Status: DC

## 2018-05-18 NOTE — Addendum Note (Signed)
Addended by: [name] on: 05/18/2018 08:41 PM   Modules accepted: Orders

## 2018-05-18 NOTE — Telephone Encounter (Signed)
Copied from [place] [id number]. Topic: General - Other >> May 18, 2018 12:40 PM Ivar Drape wrote: Reason for CRM:   Patient would like a return call from Dr. Jenny Reichmann or his assistant concerning his blood pressure.

## 2018-05-18 NOTE — Telephone Encounter (Signed)
Pt stated that he has noticed that his BP's have been in the high 140/80's. He has been keeping a record of since he seen Dr. Annamarie Dawley, cardiologist, mentioned to him that his BP elevated during his 10/1 visit. He stated that he has been having headaches recently as well. Would the patient need a medication increase or would you like for him to come in for an OV? Please advise.

## 2018-05-18 NOTE — Telephone Encounter (Signed)
[place] for increased losartan to 100 mg qd - done erx

## 2018-05-19 NOTE — Telephone Encounter (Signed)
Called pt, LVM with details below  

## 2018-05-20 ENCOUNTER — Encounter (HOSPITAL_COMMUNITY)
Admission: RE | Admit: 2018-05-20 | Discharge: 2018-05-20 | Disposition: A | Discharge status: Home / Self Care | Payer: Medicare Other | Source: Ambulatory Visit | Attending: Oncology | Admitting: Oncology

## 2018-05-20 DIAGNOSIS — D3A8 Other benign neuroendocrine tumors: Secondary | ICD-10-CM | POA: Insufficient documentation

## 2018-05-20 DIAGNOSIS — C254 Malignant neoplasm of endocrine pancreas: Secondary | ICD-10-CM | POA: Diagnosis not present

## 2018-05-20 MED ORDER — GALLIUM GA 68 DOTATATE IV KIT
4.7500 | PACK | Freq: Once | INTRAVENOUS | Status: AC
  Administered 2018-05-20: 4.75 via INTRAVENOUS

## 2018-05-24 DIAGNOSIS — I10 Essential (primary) hypertension: Secondary | ICD-10-CM | POA: Diagnosis not present

## 2018-05-25 ENCOUNTER — Ambulatory Visit: Payer: Self-pay | Admitting: *Deleted

## 2018-05-25 ENCOUNTER — Encounter: Payer: Self-pay | Admitting: Family

## 2018-05-25 ENCOUNTER — Ambulatory Visit (INDEPENDENT_AMBULATORY_CARE_PROVIDER_SITE_OTHER): Payer: Medicare Other | Admitting: Family

## 2018-05-25 VITALS — BP 146/92 | HR 81 | Temp 97.6°F | Ht 70.0 in | Wt 188.0 lb

## 2018-05-25 DIAGNOSIS — I10 Essential (primary) hypertension: Secondary | ICD-10-CM | POA: Diagnosis not present

## 2018-05-25 MED ORDER — BLOOD PRESSURE CUFF MISC: 0 refills | Status: DC

## 2018-05-25 MED ORDER — LOSARTAN POTASSIUM-HCTZ 100-25 MG PO TABS: 1.0000 | ORAL_TABLET | Freq: Every day | ORAL | 1 refills | Status: DC

## 2018-05-25 NOTE — Progress Notes (Signed)
Ronald Thompson is a 63 y.o. male with the following history as recorded in [employer]:  Patient Active Problem List   Diagnosis Date Noted  . HTN (hypertension) 02/11/2018  . Increased prostate specific antigen (PSA) velocity 02/11/2018  . Carpal tunnel syndrome 10/04/2017  . Right cervical radiculopathy 10/02/2017  . Hyperglycemia 01/08/2017  . Constipation 07/23/2016  . Shoulder pain, left 07/23/2016  . S/P TAVR (transcatheter aortic valve replacement) 04/16/2016  . Dyspnea 11/08/2015  . Pulmonary fibrosis ([place]) 11/08/2015  . Nocturia 11/08/2015  . Snoring 11/08/2015  . Deviated nasal septum 11/08/2015  . Bilateral hearing loss 11/08/2015  . Erectile dysfunction 02/07/2015  . Fatigue 02/07/2015  . Olecranon bursitis 09/27/2014  . Essential tremor 09/27/2014  . Primary neuroendocrine tumor of pancreas 02/14/2014  . Abdominal pain, unspecified site 02/04/2014  . Dehydration 02/04/2014  . Aortic stenosis 02/03/2014  . History of shingles 02/03/2014  . Left lumbar radiculopathy 11/30/2012  . Acute bronchitis 12/19/2011  . Encounter for preventative adult health care exam with abnormal findings 12/15/2011  . HEART MURMUR, SYSTOLIC 37/62/8315  . PREHYPERTENSION 10/14/2008  . Hyperlipidemia 07/16/2007  . BAKER'S CYST 06/06/2007    Current Outpatient Medications  Medication Sig Dispense Refill  . acetaminophen (TYLENOL) 500 MG tablet Take 1,000 mg by mouth every 6 (six) hours as needed for headache.    . albuterol (PROVENTIL HFA;VENTOLIN HFA) 108 (90 Base) MCG/ACT inhaler Inhale 1-2 puffs into the lungs every 6 (six) hours as needed for wheezing or shortness of breath. 1 Inhaler 0  . calcium carbonate (TUMS - DOSED IN MG ELEMENTAL CALCIUM) 500 MG chewable tablet Chew 3 tablets by mouth 3 (three) times daily as needed for indigestion or heartburn.     . clindamycin (CLEOCIN) 300 MG capsule Take 2 capsules by mouth one hour prior to dental appointment 6 capsule 2  . docusate sodium  (COLACE) 100 MG capsule Take 100 mg by mouth daily as needed for mild constipation.    Marland Kitchen ibuprofen (ADVIL,MOTRIN) 200 MG tablet Take 400 mg by mouth every 6 (six) hours as needed for headache or mild pain.     Marland Kitchen losartan (COZAAR) 100 MG tablet Take 1 tablet (100 mg total) by mouth daily. 90 tablet 3  . Octreotide Acetate (SANDOSTATIN IJ) Inject 1 each as directed every 30 (thirty) days.     . sennosides-docusate sodium (SENOKOT-S) 8.6-50 MG tablet Take 1 tablet by mouth as needed for constipation.    . sildenafil (VIAGRA) 100 MG tablet Take 0.5-1 tablets (50-100 mg total) by mouth daily as needed for erectile dysfunction. 5 tablet 11  . simvastatin (ZOCOR) 20 MG tablet TAKE ONE TABLET BY MOUTH DAILY AT 6 PM 90 tablet 2  . Blood Pressure Monitoring (BLOOD PRESSURE CUFF) MISC Use as directed 1 each 0  . losartan-hydrochlorothiazide (HYZAAR) 100-25 MG tablet Take 1 tablet by mouth daily. 30 tablet 1   No current facility-administered medications for this visit.    Facility-Administered Medications Ordered in Other Visits  Medication Dose Route Frequency Provider Last Rate Last Dose  . heparin lock flush 100 unit/mL  500 Units Intravenous Once Betsy Coder B, MD      . sodium chloride flush (NS) 0.9 % injection 10 mL  10 mL Intracatheter PRN Ladell Pier, MD        Allergies: Penicillins  Past Medical History:  Diagnosis Date  . Baker's cyst   . Gallstones   . Heart murmur   . Hypercholesteremia   . Hypertension   .  Lesion of left lung    hx.25 yrs ago- testicilar cancer related- only scarring left after tx.-no problems now  . Liver metastasis ([place])   . 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  . Pericarditis    2nd to tumor  . Pneumonia 1990  . Primary neuroendocrine tumor of pancreas 02/14/2014   Stage IV with multiple mets to liver  . Pulmonary fibrosis ([place]) 11/08/2015  . S/P TAVR  (transcatheter aortic valve replacement) 04/16/2016   26 mm Edwards Sapien 3 transcatheter heart valve placed via percutaneous right transfemoral approach   . Shortness of breath dyspnea    with exertion and when tired  . 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 Transfusion history    hx. 25 yrs ago-during cancer tx.    Past Surgical History:  Procedure Laterality Date  . BACK SURGERY     '14- rupt. disc   . CARDIAC CATHETERIZATION N/A 01/24/2016   Procedure: Right/Left Heart Cath and Coronary Angiography;  Surgeon: [name], MD;  Location: [facility];  Service: Cardiovascular;  Laterality: N/A;  . COLONOSCOPY    . EUS N/A 02/10/2014   Procedure: UPPER ENDOSCOPIC ULTRASOUND (EUS) LINEAR;  Surgeon: [name], MD;  [facility];  Service: Endoscopy;  Laterality: N/A;  . IR RADIOLOGIST EVAL & MGMT  02/11/2017  . KNEE SURGERY Right    age 28 for Baker's cyst  . NASAL SEPTUM SURGERY     Deviated septrum  . TEE WITHOUT CARDIOVERSION N/A 04/16/2016   Procedure: TRANSESOPHAGEAL ECHOCARDIOGRAM (TEE);  Surgeon: [name], MD;  Location: [facility];  Service: Open Heart Surgery;  Laterality: N/A;  . THORACOTOMY  1990   wedge biopsy of mediastinal mass  . TRANSCATHETER AORTIC VALVE REPLACEMENT, TRANSFEMORAL N/A 04/16/2016   Procedure: TRANSCATHETER AORTIC VALVE REPLACEMENT, TRANSFEMORAL;  Surgeon: [name], MD;  Location: [facility];  Service: Open Heart Surgery;  Laterality: N/A;    Family History  Problem Relation Age of Onset  . Dementia Mother   . Cancer Paternal Grandfather        esophagus  . Cancer Maternal Aunt        colon  . Emphysema Maternal Aunt     Social History   Tobacco Use  . Smoking status: Never Smoker  . Smokeless tobacco: Never Used  Substance Use Topics  . Alcohol use: Yes    Comment: rare- social    Subjective:  Patient presents with concerns for elevated blood  pressure; per patient, symptoms have been present x 3 weeks; patient contacted his PCP last week with these concerns- Losartan was increased to 100 mg last Monday; per patient, he has not noticed much improvement since medication change was made.  Went to [facility] yesterday with concerns about his blood pressure; numbers at [facility] yesterday are consistent with what is seen in office today;  Denies any chest pain, shortness of breath, blurred vision; + headache   Objective:  Vitals:   05/25/18 1402  BP: (!) 146/92  Pulse: 81  Temp: 97.6 F (36.4 C)  TempSrc: Oral  SpO2: 96%  Weight: 188 lb (85.3 kg)  Height: 5\' 10"  (1.778 m)    General: Well developed, well nourished, in no acute distress  Skin : Warm and dry.  Head: Normocephalic and atraumatic  Eyes: Sclera and conjunctiva clear; pupils round and  reactive to light; extraocular movements intact  Ears: External normal; canals clear; tympanic membranes normal  Oropharynx: Pink, supple. No suspicious lesions  Neck: Supple without thyromegaly, adenopathy  Lungs: Respirations unlabored; clear to auscultation bilaterally without wheeze, rales, rhonchi  CVS exam: normal rate and regular rhythm.  Neurologic: Alert and oriented; speech intact; face symmetrical; moves all extremities well; CNII-XII intact without focal deficit   Assessment:  1. Essential hypertension     Plan:  Change to Losartan HCT 100/ 25 1 po qd; DASH diet discussed; patient to purchase a new home blood pressure cuff; keep daily log/ bring machine and readings to next OV; follow-up in 2 weeks, sooner prn.   Return in about 2 weeks (around 06/08/2018).  No orders of the defined types were placed in this encounter.   Requested Prescriptions   Signed Prescriptions Disp Refills  . losartan-hydrochlorothiazide (HYZAAR) 100-25 MG tablet 30 tablet 1    Sig: Take 1 tablet by mouth daily.  . Blood Pressure Monitoring (BLOOD PRESSURE CUFF) MISC 1 each 0    Sig: Use as directed

## 2018-05-25 NOTE — Telephone Encounter (Signed)
Noted  

## 2018-05-25 NOTE — Patient Instructions (Signed)
DASH Eating Plan DASH stands for "Dietary Approaches to Stop Hypertension." The DASH eating plan is a healthy eating plan that has been shown to reduce high blood pressure (hypertension). It may also reduce your risk for type 2 diabetes, heart disease, and stroke. The DASH eating plan may also help with weight loss. What are tips for following this plan? General guidelines  Avoid eating more than 2,300 mg (milligrams) of salt (sodium) a day. If you have hypertension, you may need to reduce your sodium intake to 1,500 mg a day.  Limit alcohol intake to no more than 1 drink a day for nonpregnant women and 2 drinks a day for men. One drink equals 12 oz of beer, 5 oz of wine, or 1 oz of hard liquor.  Work with your health care provider to maintain a healthy body weight or to lose weight. Ask what an ideal weight is for you.  Get at least 30 minutes of exercise that causes your heart to beat faster (aerobic exercise) most days of the week. Activities may include walking, swimming, or biking.  Work with your health care provider or diet and nutrition specialist (dietitian) to adjust your eating plan to your individual calorie needs. Reading food labels  Check food labels for the amount of sodium per serving. Choose foods with less than 5 percent of the Daily Value of sodium. Generally, foods with less than 300 mg of sodium per serving fit into this eating plan.  To find whole grains, look for the word "whole" as the first word in the ingredient list. Shopping  Buy products labeled as "low-sodium" or "no salt added."  Buy fresh foods. Avoid canned foods and premade or frozen meals. Cooking  Avoid adding salt when cooking. Use salt-free seasonings or herbs instead of table salt or sea salt. Check with your health care provider or pharmacist before using salt substitutes.  Do not fry foods. Cook foods using healthy methods such as baking, boiling, grilling, and broiling instead.  Cook with  heart-healthy oils, such as olive, canola, soybean, or sunflower oil. Meal planning   Eat a balanced diet that includes: ? 5 or more servings of fruits and vegetables each day. At each meal, try to fill half of your plate with fruits and vegetables. ? Up to 6-8 servings of whole grains each day. ? Less than 6 oz of lean meat, poultry, or fish each day. A 3-oz serving of meat is about the same size as a deck of cards. One egg equals 1 oz. ? 2 servings of low-fat dairy each day. ? A serving of nuts, seeds, or beans 5 times each week. ? Heart-healthy fats. Healthy fats called Omega-3 fatty acids are found in foods such as flaxseeds and coldwater fish, like sardines, salmon, and mackerel.  Limit how much you eat of the following: ? Canned or prepackaged foods. ? Food that is high in trans fat, such as fried foods. ? Food that is high in saturated fat, such as fatty meat. ? Sweets, desserts, sugary drinks, and other foods with added sugar. ? Full-fat dairy products.  Do not salt foods before eating.  Try to eat at least 2 vegetarian meals each week.  Eat more home-cooked food and less restaurant, buffet, and fast food.  When eating at a restaurant, ask that your food be prepared with less salt or no salt, if possible. What foods are recommended? The items listed may not be a complete list. Talk with your dietitian about what   dietary choices are best for you. Grains Whole-grain or whole-wheat bread. Whole-grain or whole-wheat pasta. Brown rice. Oatmeal. Quinoa. Bulgur. Whole-grain and low-sodium cereals. Pita bread. Low-fat, low-sodium crackers. Whole-wheat flour tortillas. Vegetables Fresh or frozen vegetables (raw, steamed, roasted, or grilled). Low-sodium or reduced-sodium tomato and vegetable juice. Low-sodium or reduced-sodium tomato sauce and tomato paste. Low-sodium or reduced-sodium canned vegetables. Fruits All fresh, dried, or frozen fruit. Canned fruit in natural juice (without  added sugar). Meat and other protein foods Skinless chicken or turkey. Ground chicken or turkey. Pork with fat trimmed off. Fish and seafood. Egg whites. Dried beans, peas, or lentils. Unsalted nuts, nut butters, and seeds. Unsalted canned beans. Lean cuts of beef with fat trimmed off. Low-sodium, lean deli meat. Dairy Low-fat (1%) or fat-free (skim) milk. Fat-free, low-fat, or reduced-fat cheeses. Nonfat, low-sodium ricotta or cottage cheese. Low-fat or nonfat yogurt. Low-fat, low-sodium cheese. Fats and oils Soft margarine without trans fats. Vegetable oil. Low-fat, reduced-fat, or light mayonnaise and salad dressings (reduced-sodium). Canola, safflower, olive, soybean, and sunflower oils. Avocado. Seasoning and other foods Herbs. Spices. Seasoning mixes without salt. Unsalted popcorn and pretzels. Fat-free sweets. What foods are not recommended? The items listed may not be a complete list. Talk with your dietitian about what dietary choices are best for you. Grains Baked goods made with fat, such as croissants, muffins, or some breads. Dry pasta or rice meal packs. Vegetables Creamed or fried vegetables. Vegetables in a cheese sauce. Regular canned vegetables (not low-sodium or reduced-sodium). Regular canned tomato sauce and paste (not low-sodium or reduced-sodium). Regular tomato and vegetable juice (not low-sodium or reduced-sodium). Pickles. Olives. Fruits Canned fruit in a light or heavy syrup. Fried fruit. Fruit in cream or butter sauce. Meat and other protein foods Fatty cuts of meat. Ribs. Fried meat. Bacon. Sausage. Bologna and other processed lunch meats. Salami. Fatback. Hotdogs. Bratwurst. Salted nuts and seeds. Canned beans with added salt. Canned or smoked fish. Whole eggs or egg yolks. Chicken or turkey with skin. Dairy Whole or 2% milk, cream, and half-and-half. Whole or full-fat cream cheese. Whole-fat or sweetened yogurt. Full-fat cheese. Nondairy creamers. Whipped toppings.  Processed cheese and cheese spreads. Fats and oils Butter. Stick margarine. Lard. Shortening. Ghee. Bacon fat. Tropical oils, such as coconut, palm kernel, or palm oil. Seasoning and other foods Salted popcorn and pretzels. Onion salt, garlic salt, seasoned salt, table salt, and sea salt. Worcestershire sauce. Tartar sauce. Barbecue sauce. Teriyaki sauce. Soy sauce, including reduced-sodium. Steak sauce. Canned and packaged gravies. Fish sauce. Oyster sauce. Cocktail sauce. Horseradish that you find on the shelf. Ketchup. Mustard. Meat flavorings and tenderizers. Bouillon cubes. Hot sauce and Tabasco sauce. Premade or packaged marinades. Premade or packaged taco seasonings. Relishes. Regular salad dressings. Where to find more information:  National Heart, Lung, and Blood Institute: www.nhlbi.nih.gov  American Heart Association: www.heart.org Summary  The DASH eating plan is a healthy eating plan that has been shown to reduce high blood pressure (hypertension). It may also reduce your risk for type 2 diabetes, heart disease, and stroke.  With the DASH eating plan, you should limit salt (sodium) intake to 2,300 mg a day. If you have hypertension, you may need to reduce your sodium intake to 1,500 mg a day.  When on the DASH eating plan, aim to eat more fresh fruits and vegetables, whole grains, lean proteins, low-fat dairy, and heart-healthy fats.  Work with your health care provider or diet and nutrition specialist (dietitian) to adjust your eating plan to your individual   calorie needs. This information is not intended to replace advice given to you by your health care provider. Make sure you discuss any questions you have with your health care provider. Document Released: 07/11/2011 Document Revised: 07/15/2016 Document Reviewed: 07/15/2016 Elsevier Interactive Patient Education  2018 [employer]vier Inc.  

## 2018-05-25 NOTE — Telephone Encounter (Signed)
Patient is calling to report the following:  Reason for Disposition . Systolic BP  >= 229 OR Diastolic >= 798  Answer Assessment - Initial Assessment Questions 1. BLOOD PRESSURE: "What is the blood pressure?" "Did you take at least two measurements 5 minutes apart?"     180/112 2. ONSET: "When did you take your blood pressure?"     yesterday 3. HOW: "How did you obtain the blood pressure?" (e.g., visiting nurse, automatic home BP monitor)     Automatic cuff at pharmacy 4. HISTORY: "Do you have a history of high blood pressure?"     yes 5. MEDICATIONS: "Are you taking any medications for blood pressure?" "Have you missed any doses recently?"     Yes- no missed doses 6. OTHER SYMPTOMS: "Do you have any symptoms?" (e.g., headache, chest pain, blurred vision, difficulty breathing, weakness)     no 7. PREGNANCY: "Is there any chance you are pregnant?" "When was your last menstrual period?"     n/a  Protocols used: HIGH BLOOD PRESSURE-A-AH  Patient called the on call provider and was instructed to see UC. He did go in the afternoon and reports that his BP readings were: 148/90 and 150/90. He was instructed to follow up with PCP for medication addition or adjustment. Patient states he has had recent medication adjustment to Losartan 100 mg.He had been having headaches from October 1-14- but they seem to have subsided. Patient states he has BP machine at home- but he does not use it- advised him to put new batteries in it and bring it to his appointment.

## 2018-05-27 ENCOUNTER — Other Ambulatory Visit: Payer: Self-pay | Admitting: *Deleted

## 2018-05-27 DIAGNOSIS — D3A8 Other benign neuroendocrine tumors: Secondary | ICD-10-CM

## 2018-05-28 ENCOUNTER — Ambulatory Visit: Payer: Medicare Other

## 2018-05-28 ENCOUNTER — Inpatient Hospital Stay: Payer: Medicare Other

## 2018-05-28 ENCOUNTER — Telehealth: Payer: Self-pay | Admitting: Oncology

## 2018-05-28 ENCOUNTER — Other Ambulatory Visit: Payer: Medicare Other

## 2018-05-28 ENCOUNTER — Inpatient Hospital Stay: Payer: Medicare Other | Attending: Oncology | Admitting: Oncology

## 2018-05-28 VITALS — BP 150/87 | HR 76 | Temp 98.3°F | Resp 18 | Ht 70.0 in | Wt 182.2 lb

## 2018-05-28 DIAGNOSIS — R0609 Other forms of dyspnea: Secondary | ICD-10-CM | POA: Diagnosis not present

## 2018-05-28 DIAGNOSIS — Z86718 Personal history of other venous thrombosis and embolism: Secondary | ICD-10-CM | POA: Diagnosis not present

## 2018-05-28 DIAGNOSIS — D3A8 Other benign neuroendocrine tumors: Secondary | ICD-10-CM

## 2018-05-28 DIAGNOSIS — C7B8 Other secondary neuroendocrine tumors: Secondary | ICD-10-CM | POA: Diagnosis not present

## 2018-05-28 DIAGNOSIS — C7A8 Other malignant neuroendocrine tumors: Secondary | ICD-10-CM | POA: Insufficient documentation

## 2018-05-28 LAB — CMP (CANCER CENTER ONLY)
ALT: 22 U/L (ref 0–44)
ANION GAP: 11 (ref 5–15)
AST: 25 U/L (ref 15–41)
Albumin: 4.2 g/dL (ref 3.5–5.0)
Alkaline Phosphatase: 64 U/L (ref 38–126)
BUN: 20 mg/dL (ref 8–23)
CHLORIDE: 98 mmol/L (ref 98–111)
CO2: 31 mmol/L (ref 22–32)
Calcium: 9.6 mg/dL (ref 8.9–10.3)
Creatinine: 1.06 mg/dL (ref 0.61–1.24)
GFR, Estimated: >60 mL/min (ref >60)
Glucose, Bld: 102 mg/dL — ABNORMAL HIGH (ref 70–99)
POTASSIUM: 4.2 mmol/L (ref 3.5–5.1)
SODIUM: 140 mmol/L (ref 135–145)
Total Bilirubin: 0.9 mg/dL (ref 0.3–1.2)
Total Protein: 7.1 g/dL (ref 6.5–8.1)

## 2018-05-28 LAB — CBC WITH DIFFERENTIAL (CANCER CENTER ONLY)
Abs Immature Granulocytes: 0 10*3/uL (ref 0.00–0.07)
BASOS ABS: 0 10*3/uL (ref 0.0–0.1)
Basophils Relative: 0 %
Eosinophils Absolute: 0 10*3/uL (ref 0.0–0.5)
Eosinophils Relative: 1 %
HEMATOCRIT: 43.9 % (ref 39.0–52.0)
Hemoglobin: 14.9 g/dL (ref 13.0–17.0)
IMMATURE GRANULOCYTES: 0 %
LYMPHS ABS: 0.4 10*3/uL — AB (ref 0.7–4.0)
Lymphocytes Relative: 10 %
MCH: 32.8 pg (ref 26.0–34.0)
MCHC: 33.9 g/dL (ref 30.0–36.0)
MCV: 96.7 fL (ref 80.0–100.0)
Monocytes Absolute: 0.6 10*3/uL (ref 0.1–1.0)
Monocytes Relative: 16 %
NEUTROS ABS: 2.4 10*3/uL (ref 1.7–7.7)
NEUTROS PCT: 73 %
NRBC: 0 % (ref 0.0–0.2)
PLATELETS: 136 10*3/uL — AB (ref 150–400)
RBC: 4.54 MIL/uL (ref 4.22–5.81)
RDW: 12 % (ref 11.5–15.5)
WBC Count: 3.4 10*3/uL — ABNORMAL LOW (ref 4.0–10.5)

## 2018-05-28 MED ORDER — OCTREOTIDE ACETATE 30 MG IM KIT
30.0000 mg | PACK | Freq: Once | INTRAMUSCULAR | Status: AC
  Administered 2018-05-28: 30 mg via INTRAMUSCULAR

## 2018-05-28 NOTE — Progress Notes (Signed)
[facility] OFFICE PROGRESS NOTE   Diagnosis: Pancreas neuroendocrine tumor  INTERVAL HISTORY:   Ronald Thompson returns as scheduled.  He continues monthly Sandostatin.  He feels well.  No pain.  Good appetite.  He is exercising.  Stable exertional dyspnea.  No diarrhea.  He recently had elevated blood pressure and was started on losartan/HCTZ.  Objective:  Vital signs in last 24 hours:  Blood pressure (!) 150/87, pulse 76, temperature 98.3 F (36.8 C), temperature source Oral, resp. rate 18, height 5\' 10"  (1.778 m), weight 182 lb 3.2 oz (82.6 kg), SpO2 97 %. Resp: Lungs clear bilaterally with decreased breath sounds at the left upper anterior chest, no respiratory distress Cardio: Regular rate and rhythm, 2/6 diastolic murmur GI: Nontender, no mass, no hepatosplenomegaly Vascular: No leg edema  Lab Results:  Lab Results  Component Value Date   WBC 3.4 (L) 05/28/2018   HGB 14.9 05/28/2018   HCT 43.9 05/28/2018   MCV 96.7 05/28/2018   PLT 136 (L) 05/28/2018   NEUTROABS 2.4 05/28/2018    CMP  Lab Results  Component Value Date   NA 140 05/28/2018   K 4.2 05/28/2018   CL 98 05/28/2018   CO2 31 05/28/2018   GLUCOSE 102 (H) 05/28/2018   BUN 20 05/28/2018   CREATININE 1.06 05/28/2018   CALCIUM 9.6 05/28/2018   PROT 7.1 05/28/2018   ALBUMIN 4.2 05/28/2018   AST 25 05/28/2018   ALT 22 05/28/2018   ALKPHOS 64 05/28/2018   BILITOT 0.9 05/28/2018   GFRNONAA >60 05/28/2018   GFRAA >60 05/28/2018      Imaging: Netspot images from 05/20/2018 reviewed with [name]   Medications: I have reviewed the patient's current medications.   Assessment/Plan: 1. Pancreatic neuroendocrine tumor, WHO grade 2, pancreatic head mass and small peripancreatic/celiac nodes on a CT 02/04/2014  EUS revealed evidence of multiple liver metastases-status post an FNA biopsy of a left liver lesion 02/10/2014 confirming a neuroendocrine tumor   Octreotide scan 04/01/2014 with  multiple foci of metastatic neuroendocrine tumor in the liver  Monthly Sandostatin started 03/16/2014  Restaging CT 07/04/2014 with resolution of a previously noted pancreas head lesion and probable progression of liver metastases  Restaging CT 10/31/2014-stable liver lesions, no evidence of a pancreas mass, stable.  Restaging CT 02/28/2015-stable hepatic metastases, stable pancreas lesion unchanged  Restaging CT 08/30/2015-stable pancreas mass, slight enlargement of hepatic metastases  Monthly Sandostatin continued  Restaging CTs06/14/2017 revealed enlargement of liver lesions, no new lesions, enlargement of the pancreas head mass  Gallium DOTATATEscan 10/01/2016 confirmed uptake in the liver metastases, pancreas primary, peripancreatic adenopathy, and left iliac bone  Cycle 1 Lutathera 03/05/2017  Cycle 2Lutathera09/26/2018  Cycle 3 Lutathera 07/02/2017  Cycle 4 Lutathera 08/27/2017  Netspotscan 09/29/2017-decreased radiotracer activity within the pancreas head, peripancreatic lymph node, and solitary skeletal lesion. Liver lesions have increased in size with a decrease in radiotracer activity  Monthly Sandostatin continued  Netspot scan 05/20/2018-stable to decreased size of liver lesions, most have decreased SUV, similar uptake in the pancreas lesion, previously noted peripancreatic lymph node has resolved him a mild decrease in uptake associated with a left iliac metastasis, no evidence of disease progression  Monthly Sandostatin continued  2. Chest Seminoma in 1990 treated with BEP and chest [facility] 3. 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 chronic exertional dyspnea following treatment for the  seminoma  5. aortic stenosis on an echocardiogram December 2011, severe aortic stenosis on echocardiogram  12/06/2015, status postTAVR on 04/16/2016 6. lumbar disc surgery 2014  7. acute abdominal pain 02/04/2014-resolved     Disposition: [name] appears stable.  The restaging dotatate scan reveals no evidence of disease progression.  He will continue monthly Sandostatin.  He will return for an office visit in 4 months.  I reviewed the dotatate images with him.  Betsy Coder, MD  05/28/2018  8:47 AM

## 2018-05-28 NOTE — Telephone Encounter (Signed)
Appts scheduled avs/calendar printed per 10/24 los °

## 2018-06-18 ENCOUNTER — Other Ambulatory Visit (INDEPENDENT_AMBULATORY_CARE_PROVIDER_SITE_OTHER): Payer: Medicare Other

## 2018-06-18 ENCOUNTER — Encounter: Payer: Self-pay | Admitting: Internal Medicine

## 2018-06-18 ENCOUNTER — Ambulatory Visit (INDEPENDENT_AMBULATORY_CARE_PROVIDER_SITE_OTHER): Payer: Medicare Other | Admitting: Internal Medicine

## 2018-06-18 VITALS — BP 124/76 | HR 77 | Temp 97.9°F | Ht 70.0 in | Wt 189.0 lb

## 2018-06-18 DIAGNOSIS — I10 Essential (primary) hypertension: Secondary | ICD-10-CM | POA: Diagnosis not present

## 2018-06-18 DIAGNOSIS — E785 Hyperlipidemia, unspecified: Secondary | ICD-10-CM | POA: Diagnosis not present

## 2018-06-18 DIAGNOSIS — R739 Hyperglycemia, unspecified: Secondary | ICD-10-CM

## 2018-06-18 DIAGNOSIS — Z0000 Encounter for general adult medical examination without abnormal findings: Secondary | ICD-10-CM | POA: Diagnosis not present

## 2018-06-18 DIAGNOSIS — N320 Bladder-neck obstruction: Secondary | ICD-10-CM | POA: Diagnosis not present

## 2018-06-18 DIAGNOSIS — H6121 Impacted cerumen, right ear: Secondary | ICD-10-CM | POA: Diagnosis not present

## 2018-06-18 LAB — PSA: PSA: 1.35 ng/mL (ref 0.10–4.00)

## 2018-06-18 LAB — URINALYSIS, ROUTINE W REFLEX MICROSCOPIC
Bilirubin Urine: NEGATIVE
HGB URINE DIPSTICK: NEGATIVE
Ketones, ur: NEGATIVE
Leukocytes, UA: NEGATIVE
NITRITE: NEGATIVE
SPECIFIC GRAVITY, URINE: 1.02 (ref 1.000–1.030)
Total Protein, Urine: NEGATIVE
Urine Glucose: NEGATIVE
Urobilinogen, UA: 0.2 (ref 0.0–1.0)
WBC UA: NONE SEEN (ref 0–?)
pH: 6 (ref 5.0–8.0)

## 2018-06-18 LAB — LIPID PANEL
Cholesterol: 158 mg/dL (ref 0–200)
HDL: 52.9 mg/dL (ref >39.00)
LDL Cholesterol: 86 mg/dL (ref 0–99)
NonHDL: 104.97
Total CHOL/HDL Ratio: 3
Triglycerides: 97 mg/dL (ref 0.0–149.0)
VLDL: 19.4 mg/dL (ref 0.0–40.0)

## 2018-06-18 LAB — HEMOGLOBIN A1C: Hgb A1c MFr Bld: 6.3 % (ref 4.6–6.5)

## 2018-06-18 LAB — TSH: TSH: 1.19 u[IU]/mL (ref 0.35–4.50)

## 2018-06-18 MED ORDER — LISINOPRIL-HYDROCHLOROTHIAZIDE 20-12.5 MG PO TABS: 1.0000 | ORAL_TABLET | Freq: Every day | ORAL | 3 refills | Status: DC

## 2018-06-18 NOTE — Assessment & Plan Note (Signed)
stable overall by history and exam, recent data reviewed with pt, and pt to continue medical treatment as before,  to f/u any worsening symptoms or concerns  

## 2018-06-18 NOTE — Patient Instructions (Signed)
Your right ear was irrigated of wax today  Please continue all other medications as before, and refills have been done if requested.  Please have the pharmacy call with any other refills you may need.  Please continue your efforts at being more active, low cholesterol diet, and weight control.  You are otherwise up to date with prevention measures today.  Please keep your appointments with your specialists as you may have planned  Please go to the LAB in the Basement (turn left off the elevator) for the tests to be done today  You will be contacted by phone if any changes need to be made immediately.  Otherwise, you will receive a letter about your results with an explanation, but please check with MyChart first.  Please remember to sign up for MyChart if you have not done so, as this will be important to you in the future with finding out test results, communicating by private email, and scheduling acute appointments online when needed.  Please return in 1 year for your yearly visit, or sooner if needed, with Lab testing done 3-5 days before

## 2018-06-18 NOTE — Progress Notes (Signed)
Subjective:    Patient ID: Ronald Thompson, male    DOB: 1955-10-23, 62 y.o.   MRN: [id number]  HPI  Here for yearly f/u;  Overall doing ok;  Pt denies Chest pain, worsening SOB, DOE, wheezing, orthopnea, PND, worsening LE edema, palpitations, dizziness or syncope.  Pt denies neurological change such as new headache, facial or extremity weakness.  Pt denies polydipsia, polyuria, or low sugar symptoms. Pt states overall good compliance with treatment and medications, good tolerability, and has been trying to follow appropriate diet.  Pt denies worsening depressive symptoms, suicidal ideation or panic. No fever, night sweats, wt loss, loss of appetite, or other constitutional symptoms.  Pt states good ability with ADL's, has low fall risk, home safety reviewed and adequate, no other significant changes in hearing or vision, and only occasionally active with exercise. Bp now well controlled on current meds.  Right ear wax impacted again with some hearing loss in the past week.   Past Medical History:  Diagnosis Date  . Baker's cyst   . Gallstones   . Heart murmur   . Hypercholesteremia   . Hypertension   . Lesion of left lung    hx.25 yrs ago- testicilar cancer related- only scarring left after tx.-no problems now  . Liver metastasis ([place])   . 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  . Pericarditis    2nd to tumor  . Pneumonia 1990  . Primary neuroendocrine tumor of pancreas 02/14/2014   Stage IV with multiple mets to liver  . Pulmonary fibrosis ([place]) 11/08/2015  . S/P TAVR (transcatheter aortic valve replacement) 04/16/2016   26 mm Edwards Sapien 3 transcatheter heart valve placed via percutaneous right transfemoral approach   . Shortness of breath dyspnea    with exertion and when tired  . 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  Transfusion history    hx. 25 yrs ago-during cancer tx.   Past Surgical History:  Procedure Laterality Date  . BACK SURGERY     '14- rupt. disc   . CARDIAC CATHETERIZATION N/A 01/24/2016   Procedure: Right/Left Heart Cath and Coronary Angiography;  Surgeon: [name], MD;  Location: [facility];  Service: Cardiovascular;  Laterality: N/A;  . COLONOSCOPY    . EUS N/A 02/10/2014   Procedure: UPPER ENDOSCOPIC ULTRASOUND (EUS) LINEAR;  Surgeon: [name], MD;  Location: [facility];  Service: Endoscopy;  Laterality: N/A;  . IR RADIOLOGIST EVAL & MGMT  02/11/2017  . KNEE SURGERY Right    age 23 for Baker's cyst  . NASAL SEPTUM SURGERY     Deviated septrum  . TEE WITHOUT CARDIOVERSION N/A 04/16/2016   Procedure: TRANSESOPHAGEAL ECHOCARDIOGRAM (TEE);  Surgeon: [name], MD;  Location: [facility];  Service: Open Heart Surgery;  Laterality: N/A;  . THORACOTOMY  1990   wedge biopsy of mediastinal mass  . TRANSCATHETER AORTIC VALVE REPLACEMENT, TRANSFEMORAL N/A 04/16/2016   Procedure: TRANSCATHETER AORTIC VALVE REPLACEMENT, TRANSFEMORAL;  Surgeon: [name], MD;  Location: [facility];  Service: Open Heart Surgery;  Laterality: N/A;    reports that he has never smoked. He has never used smokeless tobacco. He reports that he drinks alcohol. He reports that he does not use drugs. family history includes Cancer in his maternal aunt and paternal grandfather; Dementia in his mother; Emphysema in his maternal aunt.  Allergies  Allergen Reactions  . Penicillins Hives    ENTIRE BODY Has patient had a PCN reaction causing immediate rash, facial/tongue/throat swelling, SOB or lightheadedness with hypotension: No Has patient had a PCN reaction causing severe rash involving mucus membranes or skin necrosis: No Has patient had a PCN reaction that required hospitalization * *  YES  * * Has patient had a PCN reaction occurring within the last 10 years: No If all of the above answers are "NO", then  may proceed with Cephalosporin use. *reaction occurred when he was 17   Current Outpatient Medications on File Prior to Visit  Medication Sig Dispense Refill  . acetaminophen (TYLENOL) 500 MG tablet Take 1,000 mg by mouth every 6 (six) hours as needed for headache.    . albuterol (PROVENTIL HFA;VENTOLIN HFA) 108 (90 Base) MCG/ACT inhaler Inhale 1-2 puffs into the lungs every 6 (six) hours as needed for wheezing or shortness of breath. 1 Inhaler 0  . Blood Pressure Monitoring (BLOOD PRESSURE CUFF) MISC Use as directed 1 each 0  . calcium carbonate (TUMS - DOSED IN MG ELEMENTAL CALCIUM) 500 MG chewable tablet Chew 3 tablets by mouth 3 (three) times daily as needed for indigestion or heartburn.     . clindamycin (CLEOCIN) 300 MG capsule Take 2 capsules by mouth one hour prior to dental appointment 6 capsule 2  . docusate sodium (COLACE) 100 MG capsule Take 100 mg by mouth daily as needed for mild constipation.    Marland Kitchen ibuprofen (ADVIL,MOTRIN) 200 MG tablet Take 400 mg by mouth every 6 (six) hours as needed for headache or mild pain.     . Octreotide Acetate (SANDOSTATIN IJ) Inject 1 each as directed every 30 (thirty) days.     . sennosides-docusate sodium (SENOKOT-S) 8.6-50 MG tablet Take 1 tablet by mouth as needed for constipation.    . sildenafil (VIAGRA) 100 MG tablet Take 0.5-1 tablets (50-100 mg total) by mouth daily as needed for erectile dysfunction. 5 tablet 11  . simvastatin (ZOCOR) 20 MG tablet TAKE ONE TABLET BY MOUTH DAILY AT 6 PM 90 tablet 2   Current Facility-Administered Medications on File Prior to Visit  Medication Dose Route Frequency Provider Last Rate Last Dose  . heparin lock flush 100 unit/mL  500 Units Intravenous Once Betsy Coder B, MD      . sodium chloride flush (NS) 0.9 % injection 10 mL  10 mL Intracatheter PRN Ladell Pier, MD       Review of Systems  Constitutional: Negative for other unusual diaphoresis or sweats HENT: Negative for ear discharge or  swelling Eyes: Negative for other worsening visual disturbances Respiratory: Negative for stridor or other swelling  Gastrointestinal: Negative for worsening distension or other blood Genitourinary: Negative for retention or other urinary change Musculoskeletal: Negative for other MSK pain or swelling Skin: Negative for color change or other new lesions Neurological: Negative for worsening tremors and other numbness  Psychiatric/Behavioral: Negative for worsening agitation or other fatigue All other system neg per pt    Objective:   Physical Exam BP 124/76   Pulse 77   Temp 97.9 F (36.6 C) (Oral)   Ht 5\' 10"  (1.778 m)   Wt 189 lb (85.7 kg)   SpO2 96%   BMI 27.12 kg/m  VS noted,  Constitutional: Pt appears in NAD HENT: Head: NCAT.  Right Ear: External ear normal.  Left Ear: External ear normal. Left wax impaction resolved with irrigation Eyes: . Pupils are equal, round, and  reactive to light. Conjunctivae and EOM are normal Nose: without d/c or deformity Neck: Neck supple. Gross normal ROM Cardiovascular: Normal rate and regular rhythm.   Pulmonary/Chest: Effort normal and breath sounds without rales or wheezing.  Abd:  Soft, NT, ND, + BS, no organomegaly Neurological: Pt is alert. At baseline orientation, motor grossly intact Skin: Skin is warm. No rashes, other new lesions, no LE edema Psychiatric: Pt behavior is normal without agitation  No other exam findings Lab Results  Component Value Date   WBC 3.4 (L) 05/28/2018   HGB 14.9 05/28/2018   HCT 43.9 05/28/2018   PLT 136 (L) 05/28/2018   GLUCOSE 102 (H) 05/28/2018   CHOL 158 06/18/2018   TRIG 97.0 06/18/2018   HDL 52.90 06/18/2018   LDLDIRECT 139.2 10/10/2008   LDLCALC 86 06/18/2018   ALT 22 05/28/2018   AST 25 05/28/2018   NA 140 05/28/2018   K 4.2 05/28/2018   CL 98 05/28/2018   CREATININE 1.06 05/28/2018   BUN 20 05/28/2018   CO2 31 05/28/2018   TSH 1.19 06/18/2018   PSA 1.35 06/18/2018   INR 1.30  04/16/2016   HGBA1C 6.3 06/18/2018       Assessment & Plan:  ight eare

## 2018-06-19 ENCOUNTER — Encounter: Payer: Self-pay | Admitting: Internal Medicine

## 2018-07-01 ENCOUNTER — Inpatient Hospital Stay: Payer: Medicare Other | Attending: Oncology

## 2018-07-01 DIAGNOSIS — C7A8 Other malignant neuroendocrine tumors: Secondary | ICD-10-CM | POA: Insufficient documentation

## 2018-07-01 DIAGNOSIS — C7B8 Other secondary neuroendocrine tumors: Secondary | ICD-10-CM | POA: Insufficient documentation

## 2018-07-01 DIAGNOSIS — D3A8 Other benign neuroendocrine tumors: Secondary | ICD-10-CM

## 2018-07-01 MED ORDER — OCTREOTIDE ACETATE 30 MG IM KIT
PACK | INTRAMUSCULAR | Status: AC
  Filled 2018-07-01: qty 1

## 2018-07-01 MED ORDER — OCTREOTIDE ACETATE 30 MG IM KIT
30.0000 mg | PACK | Freq: Once | INTRAMUSCULAR | Status: AC
  Administered 2018-07-01: 30 mg via INTRAMUSCULAR

## 2018-07-01 NOTE — Patient Instructions (Signed)

## 2018-07-27 ENCOUNTER — Other Ambulatory Visit: Payer: Self-pay | Admitting: Family

## 2018-08-04 ENCOUNTER — Inpatient Hospital Stay: Payer: Medicare Other | Attending: Oncology

## 2018-08-04 DIAGNOSIS — C7B8 Other secondary neuroendocrine tumors: Secondary | ICD-10-CM | POA: Diagnosis not present

## 2018-08-04 DIAGNOSIS — D3A8 Other benign neuroendocrine tumors: Secondary | ICD-10-CM

## 2018-08-04 DIAGNOSIS — C7A8 Other malignant neuroendocrine tumors: Secondary | ICD-10-CM | POA: Insufficient documentation

## 2018-08-04 MED ORDER — OCTREOTIDE ACETATE 30 MG IM KIT
30.0000 mg | PACK | Freq: Once | INTRAMUSCULAR | Status: AC
  Administered 2018-08-04: 30 mg via INTRAMUSCULAR

## 2018-08-04 MED ORDER — OCTREOTIDE ACETATE 30 MG IM KIT
PACK | INTRAMUSCULAR | Status: AC
  Filled 2018-08-04: qty 1

## 2018-08-04 NOTE — Patient Instructions (Signed)

## 2018-08-18 ENCOUNTER — Ambulatory Visit (INDEPENDENT_AMBULATORY_CARE_PROVIDER_SITE_OTHER): Payer: Medicare Other | Admitting: Internal Medicine

## 2018-08-18 ENCOUNTER — Encounter: Payer: Self-pay | Admitting: Internal Medicine

## 2018-08-18 VITALS — BP 128/86 | HR 81 | Temp 97.8°F | Ht 70.0 in | Wt 189.0 lb

## 2018-08-18 DIAGNOSIS — R739 Hyperglycemia, unspecified: Secondary | ICD-10-CM | POA: Diagnosis not present

## 2018-08-18 DIAGNOSIS — I10 Essential (primary) hypertension: Secondary | ICD-10-CM

## 2018-08-18 DIAGNOSIS — E785 Hyperlipidemia, unspecified: Secondary | ICD-10-CM

## 2018-08-18 NOTE — Assessment & Plan Note (Signed)
stable overall by history and exam, recent data reviewed with pt, and pt to continue medical treatment as before,  to f/u any worsening symptoms or concerns  

## 2018-08-18 NOTE — Progress Notes (Signed)
Subjective:    Patient ID: Ronald Thompson, male    DOB: 21-Dec-1955, 63 y.o.   MRN: [id number]  HPI  Here to f/u; overall doing ok,  Pt denies chest pain, increasing sob or doe, wheezing, orthopnea, PND, increased LE swelling, palpitations, dizziness or syncope.  Pt denies new neurological symptoms such as new headache, or facial or extremity weakness or numbness.  Pt denies polydipsia, polyuria, or low sugar episode.  Pt states overall good compliance with meds, mostly trying to follow appropriate diet, with wt overall stable,  but little exercise however, and has noticed some overall decreased stamina in the past yr, likely related to slow progressive malignancy.  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  Does also have som off balance and nausea intermittent mild recently, not sure why Wt Readings from Last 3 Encounters:  08/18/18 189 lb (85.7 kg)  06/18/18 189 lb (85.7 kg)  05/28/18 182 lb 3.2 oz (82.6 kg)   Past Medical History:  Diagnosis Date  . Baker's cyst   . Gallstones   . Heart murmur   . Hypercholesteremia   . Hypertension   . Lesion of left lung    hx.25 yrs ago- testicilar cancer related- only scarring left after tx.-no problems now  . Liver metastasis ([place])   . 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  . Pericarditis    2nd to tumor  . Pneumonia 1990  . Primary neuroendocrine tumor of pancreas 02/14/2014   Stage IV with multiple mets to liver  . Pulmonary fibrosis ([place]) 11/08/2015  . S/P TAVR (transcatheter aortic valve replacement) 04/16/2016   26 mm Edwards Sapien 3 transcatheter heart valve placed via percutaneous right transfemoral approach   . Shortness of breath dyspnea    with exertion and when tired  . 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 Transfusion history     hx. 25 yrs ago-during cancer tx.   Past Surgical History:  Procedure Laterality Date  . BACK SURGERY     '14- rupt. disc   . CARDIAC CATHETERIZATION N/A 01/24/2016   Procedure: Right/Left Heart Cath and Coronary Angiography;  Surgeon: [name], MD;  Location: [facility];  Service: Cardiovascular;  Laterality: N/A;  . COLONOSCOPY    . EUS N/A 02/10/2014   Procedure: UPPER ENDOSCOPIC ULTRASOUND (EUS) LINEAR;  Surgeon: [name], MD;  Location: [facility];  Service: Endoscopy;  Laterality: N/A;  . IR RADIOLOGIST EVAL & MGMT  02/11/2017  . KNEE SURGERY Right    age 73 for Baker's cyst  . NASAL SEPTUM SURGERY     Deviated septrum  . TEE WITHOUT CARDIOVERSION N/A 04/16/2016   Procedure: TRANSESOPHAGEAL ECHOCARDIOGRAM (TEE);  Surgeon: [name], MD;  Location: [facility];  Service: Open Heart Surgery;  Laterality: N/A;  . THORACOTOMY  1990   wedge biopsy of mediastinal mass  . TRANSCATHETER AORTIC VALVE REPLACEMENT, TRANSFEMORAL N/A 04/16/2016   Procedure: TRANSCATHETER AORTIC VALVE REPLACEMENT, TRANSFEMORAL;  Surgeon: [name], MD;  Location: [facility];  Service: Open Heart Surgery;  Laterality: N/A;    reports that he has never smoked. He has never used smokeless tobacco. He reports current alcohol use. He reports that he does not use drugs. family history includes Cancer in his maternal aunt and paternal grandfather; Dementia in his mother;  Emphysema in his maternal aunt. Allergies  Allergen Reactions  . Penicillins Hives    ENTIRE BODY Has patient had a PCN reaction causing immediate rash, facial/tongue/throat swelling, SOB or lightheadedness with hypotension: No Has patient had a PCN reaction causing severe rash involving mucus membranes or skin necrosis: No Has patient had a PCN reaction that required hospitalization * *  YES  * * Has patient had a PCN reaction occurring within the last 10 years: No If all of the above answers are "NO", then may proceed with  Cephalosporin use. *reaction occurred when he was 17   Current Outpatient Medications on File Prior to Visit  Medication Sig Dispense Refill  . acetaminophen (TYLENOL) 500 MG tablet Take 1,000 mg by mouth every 6 (six) hours as needed for headache.    . albuterol (PROVENTIL HFA;VENTOLIN HFA) 108 (90 Base) MCG/ACT inhaler Inhale 1-2 puffs into the lungs every 6 (six) hours as needed for wheezing or shortness of breath. 1 Inhaler 0  . Blood Pressure Monitoring (BLOOD PRESSURE CUFF) MISC Use as directed 1 each 0  . calcium carbonate (TUMS - DOSED IN MG ELEMENTAL CALCIUM) 500 MG chewable tablet Chew 3 tablets by mouth 3 (three) times daily as needed for indigestion or heartburn.     . clindamycin (CLEOCIN) 300 MG capsule Take 2 capsules by mouth one hour prior to dental appointment 6 capsule 2  . docusate sodium (COLACE) 100 MG capsule Take 100 mg by mouth daily as needed for mild constipation.    Marland Kitchen ibuprofen (ADVIL,MOTRIN) 200 MG tablet Take 400 mg by mouth every 6 (six) hours as needed for headache or mild pain.     Marland Kitchen lisinopril-hydrochlorothiazide (ZESTORETIC) 20-12.5 MG tablet Take 1 tablet by mouth daily. 90 tablet 3  . losartan-hydrochlorothiazide (HYZAAR) 100-25 MG tablet TAKE ONE TABLET BY MOUTH DAILY 30 tablet 0  . Octreotide Acetate (SANDOSTATIN IJ) Inject 1 each as directed every 30 (thirty) days.     . sennosides-docusate sodium (SENOKOT-S) 8.6-50 MG tablet Take 1 tablet by mouth as needed for constipation.    . sildenafil (VIAGRA) 100 MG tablet Take 0.5-1 tablets (50-100 mg total) by mouth daily as needed for erectile dysfunction. 5 tablet 11  . simvastatin (ZOCOR) 20 MG tablet TAKE ONE TABLET BY MOUTH DAILY AT 6 PM 90 tablet 2   Current Facility-Administered Medications on File Prior to Visit  Medication Dose Route Frequency Provider Last Rate Last Dose  . heparin lock flush 100 unit/mL  500 Units Intravenous Once Betsy Coder B, MD      . sodium chloride flush (NS) 0.9 % injection  10 mL  10 mL Intracatheter PRN Ladell Pier, MD       Review of Systems  Constitutional: Negative for other unusual diaphoresis or sweats HENT: Negative for ear discharge or swelling Eyes: Negative for other worsening visual disturbances Respiratory: Negative for stridor or other swelling  Gastrointestinal: Negative for worsening distension or other blood Genitourinary: Negative for retention or other urinary change Musculoskeletal: Negative for other MSK pain or swelling Skin: Negative for color change or other new lesions Neurological: Negative for worsening tremors and other numbness  Psychiatric/Behavioral: Negative for worsening agitation or other fatigue All other system neg per pt    Objective:   Physical Exam BP 128/86   Pulse 81   Temp 97.8 F (36.6 C) (Oral)   Ht 5\' 10"  (1.778 m)   Wt 189 lb (85.7 kg)   SpO2 96%   BMI 27.12 kg/m  VS noted,  Constitutional: Pt appears in NAD HENT: Head: NCAT.  Right Ear: External ear normal.  Left Ear: External ear normal.  Eyes: . Pupils are equal, round, and reactive to light. Conjunctivae and EOM are normal Nose: without d/c or deformity Neck: Neck supple. Gross normal ROM Cardiovascular: Normal rate and regular rhythm.   Pulmonary/Chest: Effort normal and breath sounds without rales or wheezing.  Abd:  Soft, NT, ND, + BS, no organomegaly Neurological: Pt is alert. At baseline orientation, motor grossly intact Skin: Skin is warm. No rashes, other new lesions, no LE edema Psychiatric: Pt behavior is normal without agitation  No other exam findings Lab Results  Component Value Date   WBC 3.4 (L) 05/28/2018   HGB 14.9 05/28/2018   HCT 43.9 05/28/2018   PLT 136 (L) 05/28/2018   GLUCOSE 102 (H) 05/28/2018   CHOL 158 06/18/2018   TRIG 97.0 06/18/2018   HDL 52.90 06/18/2018   LDLDIRECT 139.2 10/10/2008   LDLCALC 86 06/18/2018   ALT 22 05/28/2018   AST 25 05/28/2018   NA 140 05/28/2018   K 4.2 05/28/2018   CL 98  05/28/2018   CREATININE 1.06 05/28/2018   BUN 20 05/28/2018   CO2 31 05/28/2018   TSH 1.19 06/18/2018   PSA 1.35 06/18/2018   INR 1.30 04/16/2016   HGBA1C 6.3 06/18/2018        Assessment & Plan:

## 2018-08-18 NOTE — Patient Instructions (Signed)
Please continue all other medications as before, and refills have been done if requested.  Please have the pharmacy call with any other refills you may need.  Please continue your efforts at being more active, low cholesterol diet, and weight control.  You are otherwise up to date with prevention measures today.  Please keep your appointments with your specialists as you may have planned  Please return in 1 year for your yearly visit, or sooner if needed 

## 2018-08-25 ENCOUNTER — Other Ambulatory Visit: Payer: Self-pay | Admitting: Internal Medicine

## 2018-09-01 ENCOUNTER — Inpatient Hospital Stay: Payer: Medicare Other | Attending: Oncology

## 2018-09-01 VITALS — BP 134/67 | HR 87 | Temp 98.3°F | Resp 16

## 2018-09-01 DIAGNOSIS — C7B8 Other secondary neuroendocrine tumors: Secondary | ICD-10-CM | POA: Diagnosis not present

## 2018-09-01 DIAGNOSIS — C7A8 Other malignant neuroendocrine tumors: Secondary | ICD-10-CM | POA: Diagnosis not present

## 2018-09-01 DIAGNOSIS — D3A8 Other benign neuroendocrine tumors: Secondary | ICD-10-CM

## 2018-09-01 MED ORDER — OCTREOTIDE ACETATE 30 MG IM KIT
30.0000 mg | PACK | Freq: Once | INTRAMUSCULAR | Status: AC
  Administered 2018-09-01: 30 mg via INTRAMUSCULAR

## 2018-09-21 ENCOUNTER — Ambulatory Visit (INDEPENDENT_AMBULATORY_CARE_PROVIDER_SITE_OTHER): Payer: Medicare Other | Admitting: Internal Medicine

## 2018-09-21 ENCOUNTER — Encounter: Payer: Self-pay | Admitting: Internal Medicine

## 2018-09-21 VITALS — BP 118/76 | HR 85 | Temp 97.7°F | Ht 70.0 in | Wt 187.0 lb

## 2018-09-21 DIAGNOSIS — R062 Wheezing: Secondary | ICD-10-CM | POA: Diagnosis not present

## 2018-09-21 DIAGNOSIS — R739 Hyperglycemia, unspecified: Secondary | ICD-10-CM | POA: Diagnosis not present

## 2018-09-21 DIAGNOSIS — J309 Allergic rhinitis, unspecified: Secondary | ICD-10-CM | POA: Diagnosis not present

## 2018-09-21 DIAGNOSIS — I10 Essential (primary) hypertension: Secondary | ICD-10-CM

## 2018-09-21 DIAGNOSIS — R05 Cough: Secondary | ICD-10-CM

## 2018-09-21 DIAGNOSIS — R059 Cough, unspecified: Secondary | ICD-10-CM

## 2018-09-21 MED ORDER — AZITHROMYCIN 250 MG PO TABS: ORAL_TABLET | ORAL | 1 refills | Status: DC

## 2018-09-21 MED ORDER — PREDNISONE 10 MG PO TABS: ORAL_TABLET | ORAL | 0 refills | Status: DC

## 2018-09-21 MED ORDER — METHYLPREDNISOLONE ACETATE 80 MG/ML IJ SUSP
80.0000 mg | Freq: Once | INTRAMUSCULAR | Status: AC
  Administered 2018-09-21: 80 mg via INTRAMUSCULAR

## 2018-09-21 NOTE — Assessment & Plan Note (Signed)
Mild to mod, c/w bronchitis vs pna, declines cxr, for antibx course, to f/u any worsening symptoms or concerns  

## 2018-09-21 NOTE — Assessment & Plan Note (Signed)
stable overall by history and exam, recent data reviewed with pt, and pt to continue medical treatment as before,  to f/u any worsening symptoms or concerns  

## 2018-09-21 NOTE — Assessment & Plan Note (Signed)
Mild to mod, for depomedrol IM 80, predpac asd, to f/u any worsening symptoms or concerns 

## 2018-09-21 NOTE — Progress Notes (Signed)
Subjective:    Patient ID: Ronald Thompson, male    DOB: February 13, 1956, 63 y.o.   MRN: [id number]  HPI  Here with acute onset mild to mod 2-3 days ST, HA, general weakness and malaise, with prod cough greenish sputum, but Pt denies chest pain, increased sob or doe, wheezing, orthopnea, PND, increased LE swelling, palpitations, dizziness or syncope, except for onset mild sob and wheezing last PM.  Pt denies new neurological symptoms such as new headache, or facial or extremity weakness or numbness   Pt denies polydipsia, polyuria.  No other ew complaints  Past Medical History:  Diagnosis Date  . Baker's cyst   . Gallstones   . Heart murmur   . Hypercholesteremia   . Hypertension   . Lesion of left lung    hx.25 yrs ago- testicilar cancer related- only scarring left after tx.-no problems now  . Liver metastasis ([place])   . 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  . Pericarditis    2nd to tumor  . Pneumonia 1990  . Primary neuroendocrine tumor of pancreas 02/14/2014   Stage IV with multiple mets to liver  . Pulmonary fibrosis ([place]) 11/08/2015  . S/P TAVR (transcatheter aortic valve replacement) 04/16/2016   26 mm Edwards Sapien 3 transcatheter heart valve placed via percutaneous right transfemoral approach   . Shortness of breath dyspnea    with exertion and when tired  . 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 Transfusion history    hx. 25 yrs ago-during cancer tx.   Past Surgical History:  Procedure Laterality Date  . BACK SURGERY     '14- rupt. disc   . CARDIAC CATHETERIZATION N/A 01/24/2016   Procedure: Right/Left Heart Cath and Coronary Angiography;  Surgeon: [name], MD;  Location: [facility] [facility];  Service: Cardiovascular;  Laterality: N/A;  . COLONOSCOPY    . EUS N/A 02/10/2014   Procedure: UPPER ENDOSCOPIC ULTRASOUND  (EUS) LINEAR;  Surgeon: [name], MD;  [facility];  Service: Endoscopy;  Laterality: N/A;  . IR RADIOLOGIST EVAL & MGMT  02/11/2017  . KNEE SURGERY Right    age 49 for Baker's cyst  . NASAL SEPTUM SURGERY     Deviated septrum  . TEE WITHOUT CARDIOVERSION N/A 04/16/2016   Procedure: TRANSESOPHAGEAL ECHOCARDIOGRAM (TEE);  Surgeon: [name], MD;  Location: [facility];  Service: Open Heart Surgery;  Laterality: N/A;  . THORACOTOMY  1990   wedge biopsy of mediastinal mass  . TRANSCATHETER AORTIC VALVE REPLACEMENT, TRANSFEMORAL N/A 04/16/2016   Procedure: TRANSCATHETER AORTIC VALVE REPLACEMENT, TRANSFEMORAL;  Surgeon: [name], MD;  Location: [facility];  Service: Open Heart Surgery;  Laterality: N/A;    reports that he has never smoked. He has never used smokeless tobacco. He reports current alcohol use. He reports that he does not use drugs. family history includes Cancer in his maternal aunt and paternal grandfather; Dementia in his mother; Emphysema in his maternal aunt. Allergies  Allergen Reactions  . Penicillins Hives    ENTIRE BODY Has patient had a PCN reaction causing immediate rash, facial/tongue/throat swelling, SOB or lightheadedness with hypotension: No Has patient had a PCN reaction causing severe rash involving mucus membranes or skin necrosis: No Has patient had a PCN reaction that required hospitalization * *  YES  * * Has patient had a PCN  reaction occurring within the last 10 years: No If all of the above answers are "NO", then may proceed with Cephalosporin use. *reaction occurred when he was 17   Current Outpatient Medications on File Prior to Visit  Medication Sig Dispense Refill  . acetaminophen (TYLENOL) 500 MG tablet Take 1,000 mg by mouth every 6 (six) hours as needed for headache.    . albuterol (PROVENTIL HFA;VENTOLIN HFA) 108 (90 Base) MCG/ACT inhaler Inhale 1-2 puffs into the lungs every 6 (six) hours as needed for wheezing or shortness of  breath. 1 Inhaler 0  . Blood Pressure Monitoring (BLOOD PRESSURE CUFF) MISC Use as directed 1 each 0  . calcium carbonate (TUMS - DOSED IN MG ELEMENTAL CALCIUM) 500 MG chewable tablet Chew 3 tablets by mouth 3 (three) times daily as needed for indigestion or heartburn.     . clindamycin (CLEOCIN) 300 MG capsule Take 2 capsules by mouth one hour prior to dental appointment 6 capsule 2  . docusate sodium (COLACE) 100 MG capsule Take 100 mg by mouth daily as needed for mild constipation.    Marland Kitchen ibuprofen (ADVIL,MOTRIN) 200 MG tablet Take 400 mg by mouth every 6 (six) hours as needed for headache or mild pain.     Marland Kitchen lisinopril-hydrochlorothiazide (ZESTORETIC) 20-12.5 MG tablet Take 1 tablet by mouth daily. 90 tablet 3  . losartan-hydrochlorothiazide (HYZAAR) 100-25 MG tablet TAKE ONE TABLET BY MOUTH DAILY 30 tablet 11  . Octreotide Acetate (SANDOSTATIN IJ) Inject 1 each as directed every 30 (thirty) days.     . sennosides-docusate sodium (SENOKOT-S) 8.6-50 MG tablet Take 1 tablet by mouth as needed for constipation.    . sildenafil (VIAGRA) 100 MG tablet Take 0.5-1 tablets (50-100 mg total) by mouth daily as needed for erectile dysfunction. 5 tablet 11  . simvastatin (ZOCOR) 20 MG tablet TAKE ONE TABLET BY MOUTH DAILY AT 6 PM 90 tablet 2   Current Facility-Administered Medications on File Prior to Visit  Medication Dose Route Frequency Provider Last Rate Last Dose  . heparin lock flush 100 unit/mL  500 Units Intravenous Once Betsy Coder B, MD      . sodium chloride flush (NS) 0.9 % injection 10 mL  10 mL Intracatheter PRN Ladell Pier, MD       Review of Systems  Constitutional: Negative for other unusual diaphoresis or sweats HENT: Negative for ear discharge or swelling Eyes: Negative for other worsening visual disturbances Respiratory: Negative for stridor or other swelling  Gastrointestinal: Negative for worsening distension or other blood Genitourinary: Negative for retention or other  urinary change Musculoskeletal: Negative for other MSK pain or swelling Skin: Negative for color change or other new lesions Neurological: Negative for worsening tremors and other numbness  Psychiatric/Behavioral: Negative for worsening agitation or other fatigue All other system neg per pt    Objective:   Physical Exam BP 118/76   Pulse 85   Temp 97.7 F (36.5 C) (Oral)   Ht 5\' 10"  (1.778 m)   Wt 187 lb (84.8 kg)   SpO2 96%   BMI 26.83 kg/m  VS noted, mild ill Constitutional: Pt appears in NAD HENT: Head: NCAT.  Right Ear: External ear normal.  Left Ear: External ear normal.  Eyes: . Pupils are equal, round, and reactive to light. Conjunctivae and EOM are normal Bilat tm's with mild erythema.  Max sinus areas non tender.  Pharynx with mild erythema, no exudate  Nose: without d/c or deformity Neck: Neck supple. Gross normal ROM Cardiovascular:  Normal rate and regular rhythm.   Pulmonary/Chest: Effort normal and breath sounds decreased without rales but with few bilat wheezing.  Neurological: Pt is alert. At baseline orientation, motor grossly intact Skin: Skin is warm. No rashes, other new lesions, no LE edema Psychiatric: Pt behavior is normal without agitation  No other exam findings Lab Results  Component Value Date   WBC 3.4 (L) 05/28/2018   HGB 14.9 05/28/2018   HCT 43.9 05/28/2018   PLT 136 (L) 05/28/2018   GLUCOSE 102 (H) 05/28/2018   CHOL 158 06/18/2018   TRIG 97.0 06/18/2018   HDL 52.90 06/18/2018   LDLDIRECT 139.2 10/10/2008   LDLCALC 86 06/18/2018   ALT 22 05/28/2018   AST 25 05/28/2018   NA 140 05/28/2018   K 4.2 05/28/2018   CL 98 05/28/2018   CREATININE 1.06 05/28/2018   BUN 20 05/28/2018   CO2 31 05/28/2018   TSH 1.19 06/18/2018   PSA 1.35 06/18/2018   INR 1.30 04/16/2016   HGBA1C 6.3 06/18/2018       Assessment & Plan:

## 2018-09-21 NOTE — Patient Instructions (Signed)
You had the steroid shot today  Please take all new medication as prescribed - the antibiotic, and prednisone  Please continue all other medications as before, and refills have been done if requested.  Please have the pharmacy call with any other refills you may need.  Please keep your appointments with your specialists as you may have planned    

## 2018-09-29 ENCOUNTER — Inpatient Hospital Stay: Payer: Medicare Other | Attending: Oncology

## 2018-09-29 ENCOUNTER — Telehealth: Payer: Self-pay | Admitting: Oncology

## 2018-09-29 ENCOUNTER — Inpatient Hospital Stay (HOSPITAL_BASED_OUTPATIENT_CLINIC_OR_DEPARTMENT_OTHER): Payer: Medicare Other | Admitting: Oncology

## 2018-09-29 ENCOUNTER — Other Ambulatory Visit: Payer: Medicare Other

## 2018-09-29 VITALS — BP 122/73 | HR 91 | Temp 97.8°F | Resp 19 | Ht 70.0 in | Wt 186.8 lb

## 2018-09-29 VITALS — BP 121/80 | HR 80 | Resp 18

## 2018-09-29 DIAGNOSIS — C7A8 Other malignant neuroendocrine tumors: Secondary | ICD-10-CM | POA: Diagnosis not present

## 2018-09-29 DIAGNOSIS — D3A8 Other benign neuroendocrine tumors: Secondary | ICD-10-CM

## 2018-09-29 DIAGNOSIS — R0609 Other forms of dyspnea: Secondary | ICD-10-CM

## 2018-09-29 DIAGNOSIS — C7B8 Other secondary neuroendocrine tumors: Secondary | ICD-10-CM

## 2018-09-29 MED ORDER — OCTREOTIDE ACETATE 30 MG IM KIT
30.0000 mg | PACK | Freq: Once | INTRAMUSCULAR | Status: AC
  Administered 2018-09-29: 30 mg via INTRAMUSCULAR

## 2018-09-29 MED ORDER — OCTREOTIDE ACETATE 30 MG IM KIT
PACK | INTRAMUSCULAR | Status: AC
  Filled 2018-09-29: qty 1

## 2018-09-29 NOTE — Patient Instructions (Signed)
Please provide a copy of your Medical Advanced Directive/Living Will to scan into your medical record.

## 2018-09-29 NOTE — Progress Notes (Signed)
[facility] OFFICE PROGRESS NOTE   Diagnosis: Pancreas neuroendocrine tumor  INTERVAL HISTORY:   [name] returns as scheduled.  He feels well.  No diarrhea.  Stable exertional dyspnea.  He continues monthly Sandostatin.  Objective:  Vital signs in last 24 hours:  Blood pressure 122/73, pulse 91, temperature 97.8 F (36.6 C), temperature source Oral, resp. rate 19, height 5\' 10"  (1.778 m), weight 186 lb 12.8 oz (84.7 kg), SpO2 97 %.    HEENT: Neck without mass Lymphatics: No cervical or supraclavicular nodes Resp: Creased breath sounds at the left compared to the right chest, no respiratory distress Cardio: Regular rate and rhythm, 2/6 systolic murmur GI: No hepatomegaly, nontender Vascular: No leg edema   Lab Results:  Lab Results  Component Value Date   WBC 3.4 (L) 05/28/2018   HGB 14.9 05/28/2018   HCT 43.9 05/28/2018   MCV 96.7 05/28/2018   PLT 136 (L) 05/28/2018   NEUTROABS 2.4 05/28/2018    CMP  Lab Results  Component Value Date   NA 140 05/28/2018   K 4.2 05/28/2018   CL 98 05/28/2018   CO2 31 05/28/2018   GLUCOSE 102 (H) 05/28/2018   BUN 20 05/28/2018   CREATININE 1.06 05/28/2018   CALCIUM 9.6 05/28/2018   PROT 7.1 05/28/2018   ALBUMIN 4.2 05/28/2018   AST 25 05/28/2018   ALT 22 05/28/2018   ALKPHOS 64 05/28/2018   BILITOT 0.9 05/28/2018   GFRNONAA >60 05/28/2018   GFRAA >60 05/28/2018     Medications: I have reviewed the patient's current medications.   Assessment/Plan: 1. Pancreatic neuroendocrine tumor, WHO grade 2, pancreatic head mass and small peripancreatic/celiac nodes on a CT 02/04/2014  EUS revealed evidence of multiple liver metastases-status post an FNA biopsy of a left liver lesion 02/10/2014 confirming a neuroendocrine tumor   Octreotide scan 04/01/2014 with multiple foci of metastatic neuroendocrine tumor in the liver  Monthly Sandostatin started 03/16/2014  Restaging CT 07/04/2014 with resolution of a  previously noted pancreas head lesion and probable progression of liver metastases  Restaging CT 10/31/2014-stable liver lesions, no evidence of a pancreas mass, stable.  Restaging CT 02/28/2015-stable hepatic metastases, stable pancreas lesion unchanged  Restaging CT 08/30/2015-stable pancreas mass, slight enlargement of hepatic metastases  Monthly Sandostatin continued  Restaging CTs06/14/2017 revealed enlargement of liver lesions, no new lesions, enlargement of the pancreas head mass  Gallium DOTATATEscan 10/01/2016 confirmed uptake in the liver metastases, pancreas primary, peripancreatic adenopathy, and left iliac bone  Cycle 1 Lutathera 03/05/2017  Cycle 2Lutathera09/26/2018  Cycle 3 Lutathera 07/02/2017  Cycle 4 Lutathera 08/27/2017  Netspotscan 09/29/2017-decreased radiotracer activity within the pancreas head, peripancreatic lymph node, and solitary skeletal lesion. Liver lesions have increased in size with a decrease in radiotracer activity  Monthly Sandostatin continued  Netspot scan 05/20/2018-stable to decreased size of liver lesions, most have decreased SUV, similar uptake in the pancreas lesion, previously noted peripancreatic lymph node has resolved him a mild decrease in uptake associated with a left iliac metastasis, no evidence of disease progression  Monthly Sandostatin continued  2. Chest Seminoma in 1990 treated with BEP and chest radiation at [facility] 3. 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 chronic exertional dyspnea following treatment for the seminoma  5. aortic stenosis on an echocardiogram December 2011, severe aortic stenosis on echocardiogram 12/06/2015, status postTAVR on 04/16/2016 6. lumbar disc surgery 2014  7. acute abdominal pain 02/04/2014-resolved   Disposition: Mr.Ronald Thompson  appears  unchanged.  He continues monthly Sandostatin.  He will be scheduled for a restaging gallium dotatate scan in approximately 4 months.  He will return for an office visit in 4 months.  15 minutes were spent with the patient today.  The majority of the time was used for counseling and coordination of care.  Betsy Coder, MD  09/29/2018  12:34 PM

## 2018-09-29 NOTE — Telephone Encounter (Signed)
Gave avs and calendar ° °

## 2018-09-29 NOTE — Patient Instructions (Signed)

## 2018-10-27 ENCOUNTER — Other Ambulatory Visit: Payer: Self-pay

## 2018-10-27 ENCOUNTER — Inpatient Hospital Stay: Payer: Medicare Other | Attending: Oncology

## 2018-10-27 VITALS — BP 125/66 | HR 80 | Temp 98.1°F | Resp 18

## 2018-10-27 DIAGNOSIS — C7A8 Other malignant neuroendocrine tumors: Secondary | ICD-10-CM | POA: Diagnosis not present

## 2018-10-27 DIAGNOSIS — C7B8 Other secondary neuroendocrine tumors: Secondary | ICD-10-CM | POA: Diagnosis not present

## 2018-10-27 DIAGNOSIS — D3A8 Other benign neuroendocrine tumors: Secondary | ICD-10-CM

## 2018-10-27 MED ORDER — OCTREOTIDE ACETATE 30 MG IM KIT
30.0000 mg | PACK | Freq: Once | INTRAMUSCULAR | Status: AC
  Administered 2018-10-27: 30 mg via INTRAMUSCULAR

## 2018-10-27 MED ORDER — OCTREOTIDE ACETATE 30 MG IM KIT
PACK | INTRAMUSCULAR | Status: AC
  Filled 2018-10-27: qty 1

## 2018-10-27 NOTE — Patient Instructions (Signed)

## 2018-11-03 ENCOUNTER — Telehealth: Payer: Self-pay

## 2018-11-03 NOTE — Telephone Encounter (Signed)
Left message for pt to call back about appt. 

## 2018-11-05 NOTE — Telephone Encounter (Signed)
° ° °  Patient declined virtual visit, agreeable to schedule in August

## 2018-11-17 ENCOUNTER — Ambulatory Visit: Payer: Medicare Other | Admitting: Cardiovascular Disease

## 2018-11-25 ENCOUNTER — Other Ambulatory Visit: Payer: Self-pay

## 2018-11-25 ENCOUNTER — Inpatient Hospital Stay: Payer: Medicare Other | Attending: Oncology

## 2018-11-25 DIAGNOSIS — C7B8 Other secondary neuroendocrine tumors: Secondary | ICD-10-CM | POA: Diagnosis not present

## 2018-11-25 DIAGNOSIS — C7A8 Other malignant neuroendocrine tumors: Secondary | ICD-10-CM | POA: Diagnosis not present

## 2018-11-25 DIAGNOSIS — D3A8 Other benign neuroendocrine tumors: Secondary | ICD-10-CM

## 2018-11-25 MED ORDER — OCTREOTIDE ACETATE 30 MG IM KIT
30.0000 mg | PACK | Freq: Once | INTRAMUSCULAR | Status: AC
  Administered 2018-11-25: 30 mg via INTRAMUSCULAR

## 2018-11-25 MED ORDER — OCTREOTIDE ACETATE 30 MG IM KIT
PACK | INTRAMUSCULAR | Status: AC
  Filled 2018-11-25: qty 1

## 2018-11-25 NOTE — Patient Instructions (Signed)

## 2018-12-30 ENCOUNTER — Inpatient Hospital Stay: Payer: Medicare Other | Attending: Oncology

## 2018-12-30 ENCOUNTER — Other Ambulatory Visit: Payer: Self-pay

## 2018-12-30 DIAGNOSIS — C7A8 Other malignant neuroendocrine tumors: Secondary | ICD-10-CM | POA: Insufficient documentation

## 2018-12-30 DIAGNOSIS — C7B8 Other secondary neuroendocrine tumors: Secondary | ICD-10-CM | POA: Diagnosis not present

## 2018-12-30 DIAGNOSIS — D3A8 Other benign neuroendocrine tumors: Secondary | ICD-10-CM

## 2018-12-30 MED ORDER — OCTREOTIDE ACETATE 30 MG IM KIT
30.0000 mg | PACK | Freq: Once | INTRAMUSCULAR | Status: AC
  Administered 2018-12-30: 30 mg via INTRAMUSCULAR

## 2018-12-30 NOTE — Patient Instructions (Signed)

## 2019-01-18 ENCOUNTER — Telehealth: Payer: Self-pay | Admitting: *Deleted

## 2019-01-18 NOTE — Telephone Encounter (Signed)
Has OV/injection on 01/26/19,but has not heard about when his NETSpot scan is being done. Sent email to managed care to please work on the [place] and let RN know when ready to schedule. Patient wants to move his OV/injection to 7/7, 7/8, or 7/9. He is available for the scan on 6/23,6/25,6/26,6/29,or 6/30.

## 2019-01-19 ENCOUNTER — Telehealth: Payer: Self-pay | Admitting: *Deleted

## 2019-01-19 NOTE — Telephone Encounter (Signed)
Called patient w/NETSPOT test on 01/29/19 at 1430/1500 at [facility] and NPO for 6 hours prior. Scheduling message sent to reschedule his OV/injection for July per dates he requested.

## 2019-01-22 ENCOUNTER — Telehealth: Payer: Self-pay | Admitting: Oncology

## 2019-01-22 NOTE — Telephone Encounter (Signed)
Spoke with patient re 7/8 appointment.

## 2019-01-26 ENCOUNTER — Ambulatory Visit: Payer: Medicare Other | Admitting: Oncology

## 2019-01-26 ENCOUNTER — Ambulatory Visit: Payer: Self-pay

## 2019-01-26 ENCOUNTER — Ambulatory Visit: Payer: Medicare Other

## 2019-01-29 ENCOUNTER — Ambulatory Visit (HOSPITAL_COMMUNITY)
Admission: RE | Admit: 2019-01-29 | Discharge: 2019-01-29 | Disposition: A | Discharge status: Home / Self Care | Payer: Medicare Other | Source: Ambulatory Visit | Attending: Oncology | Admitting: Oncology

## 2019-01-29 ENCOUNTER — Other Ambulatory Visit: Payer: Self-pay

## 2019-01-29 DIAGNOSIS — D3A8 Other benign neuroendocrine tumors: Secondary | ICD-10-CM | POA: Insufficient documentation

## 2019-01-29 DIAGNOSIS — C787 Secondary malignant neoplasm of liver and intrahepatic bile duct: Secondary | ICD-10-CM | POA: Diagnosis not present

## 2019-01-29 DIAGNOSIS — C7B8 Other secondary neuroendocrine tumors: Secondary | ICD-10-CM | POA: Diagnosis not present

## 2019-01-29 DIAGNOSIS — C259 Malignant neoplasm of pancreas, unspecified: Secondary | ICD-10-CM | POA: Diagnosis not present

## 2019-01-29 MED ORDER — GALLIUM GA 68 DOTATATE IV KIT
5.0400 | PACK | Freq: Once | INTRAVENOUS | Status: AC | PRN
  Administered 2019-01-29: 5.04 via INTRAVENOUS

## 2019-02-01 ENCOUNTER — Telehealth: Payer: Self-pay | Admitting: *Deleted

## 2019-02-01 NOTE — Telephone Encounter (Signed)
Notified of netspot results and to f/u as scheduled. Asking if he needs labs with this appointment? Per Dr. [name]: No labs necessary

## 2019-02-01 NOTE — Telephone Encounter (Signed)
-----   Message from Ladell Pier, MD sent at 02/01/2019  7:44 AM EDT ----- Please call patient, netspot shows continued improvement in majority of liver lesions, f/u as scheduled

## 2019-02-10 ENCOUNTER — Other Ambulatory Visit: Payer: Self-pay

## 2019-02-10 ENCOUNTER — Telehealth: Payer: Self-pay | Admitting: Oncology

## 2019-02-10 ENCOUNTER — Inpatient Hospital Stay: Payer: Medicare Other | Attending: Oncology | Admitting: Oncology

## 2019-02-10 ENCOUNTER — Inpatient Hospital Stay: Payer: Medicare Other

## 2019-02-10 VITALS — BP 139/87 | HR 91 | Temp 98.7°F | Resp 18 | Ht 70.0 in | Wt 186.5 lb

## 2019-02-10 DIAGNOSIS — C7B8 Other secondary neuroendocrine tumors: Secondary | ICD-10-CM

## 2019-02-10 DIAGNOSIS — R0609 Other forms of dyspnea: Secondary | ICD-10-CM

## 2019-02-10 DIAGNOSIS — D3A8 Other benign neuroendocrine tumors: Secondary | ICD-10-CM

## 2019-02-10 DIAGNOSIS — C7A8 Other malignant neuroendocrine tumors: Secondary | ICD-10-CM

## 2019-02-10 MED ORDER — OCTREOTIDE ACETATE 30 MG IM KIT
30.0000 mg | PACK | Freq: Once | INTRAMUSCULAR | Status: AC
  Administered 2019-02-10: 30 mg via INTRAMUSCULAR

## 2019-02-10 MED ORDER — OCTREOTIDE ACETATE 30 MG IM KIT
PACK | INTRAMUSCULAR | Status: AC
  Filled 2019-02-10: qty 1

## 2019-02-10 NOTE — Progress Notes (Signed)
[facility] OFFICE PROGRESS NOTE   Diagnosis: Pancreas neuroendocrine tumor  INTERVAL HISTORY:   Ronald Thompson returns as scheduled.  He continues monthly Sandostatin.  He has exertional dyspnea.  He has noted worsening recently.  He is scheduled to see cardiology next month.  No diarrhea.  Good appetite.  He is walking 2 miles with his wife.  Objective:  Vital signs in last 24 hours:  Blood pressure 139/87, pulse 91, temperature 98.7 F (37.1 C), temperature source Oral, resp. rate 18, height 5\' 10"  (1.778 m), weight 186 lb 8 oz (84.6 kg), SpO2 96 %.    Resp: Lungs clear bilaterally with decreased breath sounds at the left compared to the right chest, no respiratory distress Cardio: Regular rate and rhythm, 2/6 systolic murmur GI: No hepatosplenomegaly, nontender Vascular: Edema   Lab Results:  Lab Results  Component Value Date   WBC 3.4 (L) 05/28/2018   HGB 14.9 05/28/2018   HCT 43.9 05/28/2018   MCV 96.7 05/28/2018   PLT 136 (L) 05/28/2018   NEUTROABS 2.4 05/28/2018    CMP  Lab Results  Component Value Date   NA 140 05/28/2018   K 4.2 05/28/2018   CL 98 05/28/2018   CO2 31 05/28/2018   GLUCOSE 102 (H) 05/28/2018   BUN 20 05/28/2018   CREATININE 1.06 05/28/2018   CALCIUM 9.6 05/28/2018   PROT 7.1 05/28/2018   ALBUMIN 4.2 05/28/2018   AST 25 05/28/2018   ALT 22 05/28/2018   ALKPHOS 64 05/28/2018   BILITOT 0.9 05/28/2018   GFRNONAA >60 05/28/2018   GFRAA >60 05/28/2018    Medications: I have reviewed the patient's current medications.   Assessment/Plan: 1. Pancreatic neuroendocrine tumor, WHO grade 2, pancreatic head mass and small peripancreatic/celiac nodes on a CT 02/04/2014  EUS revealed evidence of multiple liver metastases-status post an FNA biopsy of a left liver lesion 02/10/2014 confirming a neuroendocrine tumor   Octreotide scan 04/01/2014 with multiple foci of metastatic neuroendocrine tumor in the liver  Monthly Sandostatin  started 03/16/2014  Restaging CT 07/04/2014 with resolution of a previously noted pancreas head lesion and probable progression of liver metastases  Restaging CT 10/31/2014-stable liver lesions, no evidence of a pancreas mass, stable.  Restaging CT 02/28/2015-stable hepatic metastases, stable pancreas lesion unchanged  Restaging CT 08/30/2015-stable pancreas mass, slight enlargement of hepatic metastases  Monthly Sandostatin continued  Restaging CTs06/14/2017 revealed enlargement of liver lesions, no new lesions, enlargement of the pancreas head mass  Gallium DOTATATEscan 10/01/2016 confirmed uptake in the liver metastases, pancreas primary, peripancreatic adenopathy, and left iliac bone  Cycle 1 Lutathera 03/05/2017  Cycle 2Lutathera09/26/2018  Cycle 3 Lutathera 07/02/2017  Cycle 4 Lutathera 08/27/2017  Netspotscan 09/29/2017-decreased radiotracer activity within the pancreas head, peripancreatic lymph node, and solitary skeletal lesion. Liver lesions have increased in size with a decrease in radiotracer activity  Monthly Sandostatin continued  Netspot scan 05/20/2018-stable to decreased size of liver lesions, most have decreased SUV, similar uptake in the pancreas lesion, previously noted peripancreatic lymph node has resolved him a mild decrease in uptake associated with a left iliac metastasis, no evidence of disease progression  Monthly Sandostatin continued  Netspot scan 01/29/2019- overall stable to improved, 1 lesion left hepatic lobe has increased tracer activity-unchanged in size, majority of hepatic lesions have reduced activity compared to the pretreatment scan, decreased activity left iliac bone lesion, stable activity in the head of the pancreas lesion, no new lesions  Monthly Sandostatin continue  2. Chest Seminoma in 1990 treated  with BEP and chest radiation at [facility] 3. 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 chronic exertional dyspnea following treatment for the seminoma  5. aortic stenosis on an echocardiogram December 2011, severe aortic stenosis on echocardiogram 12/06/2015, status postTAVR on 04/16/2016 6. lumbar disc surgery 2014  7. acute abdominal pain 02/04/2014-resolved     Disposition: The restaging Netspot reveals stable to improved disease.  He will continue monthly Sandostatin.  We will consider a repeat Netspot scan in 9-12 months.  The exertional dyspnea is likely related to cardiopulmonary disease.  He is scheduled for a follow-up appointment in cardiology next month.  Ronald Thompson will be scheduled for an office visit in 4 months.  Betsy Coder, MD  02/10/2019  12:24 PM

## 2019-02-10 NOTE — Telephone Encounter (Signed)
Gave avs and calendar ° °

## 2019-02-10 NOTE — Patient Instructions (Signed)

## 2019-02-15 ENCOUNTER — Other Ambulatory Visit: Payer: Self-pay | Admitting: Internal Medicine

## 2019-02-21 ENCOUNTER — Other Ambulatory Visit: Payer: Self-pay | Admitting: Internal Medicine

## 2019-03-11 ENCOUNTER — Ambulatory Visit: Payer: Medicare Other | Admitting: Cardiovascular Disease

## 2019-03-16 ENCOUNTER — Other Ambulatory Visit: Payer: Self-pay

## 2019-03-16 ENCOUNTER — Inpatient Hospital Stay: Payer: Medicare Other | Attending: Oncology

## 2019-03-16 VITALS — BP 133/80 | HR 88 | Temp 98.5°F | Resp 18

## 2019-03-16 DIAGNOSIS — D3A8 Other benign neuroendocrine tumors: Secondary | ICD-10-CM

## 2019-03-16 DIAGNOSIS — C7B8 Other secondary neuroendocrine tumors: Secondary | ICD-10-CM | POA: Insufficient documentation

## 2019-03-16 DIAGNOSIS — C7A8 Other malignant neuroendocrine tumors: Secondary | ICD-10-CM | POA: Insufficient documentation

## 2019-03-16 MED ORDER — OCTREOTIDE ACETATE 30 MG IM KIT
30.0000 mg | PACK | Freq: Once | INTRAMUSCULAR | Status: AC
  Administered 2019-03-16: 10:00:00 30 mg via INTRAMUSCULAR
  Filled 2019-03-16: qty 1

## 2019-03-26 ENCOUNTER — Telehealth: Payer: Self-pay

## 2019-03-26 NOTE — Telephone Encounter (Signed)
Patient advised of dr Judi Cong note/instructions, he prefers to see dr Jenny Reichmann on Monday, but if he changes his mind between today and tomorrow, he will call for Saturday clinic appt---will start gabapentin for back pain today

## 2019-03-26 NOTE — Telephone Encounter (Signed)
Routing to dr Jenny Reichmann, patient came by office and is experiencing lower back pain---he has some left over gabapentin from oct/2019 you previously prescribed for another reason---patient is currently seeing oncology for cancer tx, too---please advise if you think patient will be ok taking gabapentin this weekend until he sees you in office visit made for Monday 03/29/19---dr Jenny Reichmann did not have any openings for today----please advise today, I will call patient back today before we close, thanks

## 2019-03-26 NOTE — Telephone Encounter (Signed)
Yes, the gabapentin would be fine and should not cause other problems even with his other medical conditions; we can offer for him to do Sat clinic televisit if he likes

## 2019-03-29 ENCOUNTER — Other Ambulatory Visit: Payer: Self-pay

## 2019-03-29 ENCOUNTER — Ambulatory Visit (INDEPENDENT_AMBULATORY_CARE_PROVIDER_SITE_OTHER): Payer: Medicare Other | Admitting: Internal Medicine

## 2019-03-29 ENCOUNTER — Encounter: Payer: Self-pay | Admitting: Internal Medicine

## 2019-03-29 DIAGNOSIS — M545 Low back pain, unspecified: Secondary | ICD-10-CM | POA: Insufficient documentation

## 2019-03-29 DIAGNOSIS — I10 Essential (primary) hypertension: Secondary | ICD-10-CM

## 2019-03-29 DIAGNOSIS — R739 Hyperglycemia, unspecified: Secondary | ICD-10-CM

## 2019-03-29 MED ORDER — CYCLOBENZAPRINE HCL 5 MG PO TABS: 5.0000 mg | ORAL_TABLET | Freq: Three times a day (TID) | ORAL | 0 refills | Status: DC | PRN

## 2019-03-29 NOTE — Progress Notes (Signed)
Subjective:    Patient ID: Ronald Thompson, male    DOB: 1956-05-21, 63 y.o.   MRN: [id number]  HPI  Here to f/u, having some recurrent 3 days acute on chronic low back pain, seemed better at one time, had MRI with NS and not felt to need surgury; worse to bend, left lower back only, and otc topicla lidocaine seems to help.  Sharp, intermittent, dull.  Pt denies bowel or bladder change, fever, wt loss,  worsening LE pain/numbness/weakness, gait change or falls. Still does some wt lifting at home, wears belt.  No obvoius inciting event this time.  Pt denies chest pain, increased sob or doe, wheezing, orthopnea, PND, increased LE swelling, palpitations, dizziness or syncope.  Pt denies new neurological symptoms such as new headache, or facial or extremity weakness or numbness except for the above.   Pt denies polydipsia, polyuria Past Medical History:  Diagnosis Date  . Baker's cyst   . Gallstones   . Heart murmur   . Hypercholesteremia   . Hypertension   . Lesion of left lung    hx.25 yrs ago- testicilar cancer related- only scarring left after tx.-no problems now  . Liver metastasis ([place])   . 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  . Pericarditis    2nd to tumor  . Pneumonia 1990  . Primary neuroendocrine tumor of pancreas 02/14/2014   Stage IV with multiple mets to liver  . Pulmonary fibrosis ([place]) 11/08/2015  . S/P TAVR (transcatheter aortic valve replacement) 04/16/2016   26 mm Edwards Sapien 3 transcatheter heart valve placed via percutaneous right transfemoral approach   . Shortness of breath dyspnea    with exertion and when tired  . 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 Transfusion history    hx. 25 yrs ago-during cancer tx.   Past Surgical History:  Procedure Laterality Date  . BACK SURGERY     '14- rupt. disc   .  CARDIAC CATHETERIZATION N/A 01/24/2016   Procedure: Right/Left Heart Cath and Coronary Angiography;  Surgeon: [name], MD;  Location: [facility];  Service: Cardiovascular;  Laterality: N/A;  . COLONOSCOPY    . EUS N/A 02/10/2014   Procedure: UPPER ENDOSCOPIC ULTRASOUND (EUS) LINEAR;  Surgeon: [name], MD;  Location[facility];  Service: Endoscopy;  Laterality: N/A;  . IR RADIOLOGIST EVAL & MGMT  02/11/2017  . KNEE SURGERY Right    age 27 for Baker's cyst  . NASAL SEPTUM SURGERY     Deviated septrum  . TEE WITHOUT CARDIOVERSION N/A 04/16/2016   Procedure: TRANSESOPHAGEAL ECHOCARDIOGRAM (TEE);  Surgeon: [name], MD;  Location: [facility];  Service: Open Heart Surgery;  Laterality: N/A;  . THORACOTOMY  1990   wedge biopsy of mediastinal mass  . TRANSCATHETER AORTIC VALVE REPLACEMENT, TRANSFEMORAL N/A 04/16/2016   Procedure: TRANSCATHETER AORTIC VALVE REPLACEMENT, TRANSFEMORAL;  Surgeon: [name], MD;  Location: [facility];  Service: Open Heart Surgery;  Laterality: N/A;    reports that he has never smoked. He has never used smokeless tobacco. He reports current alcohol use. He reports that he does not use drugs. family history includes Cancer in his maternal aunt and paternal grandfather; Dementia in his mother; Emphysema in his maternal aunt. Allergies  Allergen Reactions  . Penicillins Hives    ENTIRE BODY Has patient had a  PCN reaction causing immediate rash, facial/tongue/throat swelling, SOB or lightheadedness with hypotension: No Has patient had a PCN reaction causing severe rash involving mucus membranes or skin necrosis: No Has patient had a PCN reaction that required hospitalization * *  YES  * * Has patient had a PCN reaction occurring within the last 10 years: No If all of the above answers are "NO", then may proceed with Cephalosporin use. *reaction occurred when he was 17   Current Outpatient Medications on File Prior to Visit  Medication Sig Dispense  Refill  . acetaminophen (TYLENOL) 500 MG tablet Take 1,000 mg by mouth every 6 (six) hours as needed for headache.    . albuterol (PROVENTIL HFA;VENTOLIN HFA) 108 (90 Base) MCG/ACT inhaler Inhale 1-2 puffs into the lungs every 6 (six) hours as needed for wheezing or shortness of breath. 1 Inhaler 0  . Blood Pressure Monitoring (BLOOD PRESSURE CUFF) MISC Use as directed 1 each 0  . calcium carbonate (TUMS - DOSED IN MG ELEMENTAL CALCIUM) 500 MG chewable tablet Chew 3 tablets by mouth 3 (three) times daily as needed for indigestion or heartburn.     . clindamycin (CLEOCIN) 300 MG capsule Take 2 capsules by mouth one hour prior to dental appointment 6 capsule 2  . docusate sodium (COLACE) 100 MG capsule Take 100 mg by mouth daily as needed for mild constipation.    Marland Kitchen ibuprofen (ADVIL,MOTRIN) 200 MG tablet Take 400 mg by mouth every 6 (six) hours as needed for headache or mild pain.     Marland Kitchen losartan-hydrochlorothiazide (HYZAAR) 100-25 MG tablet TAKE ONE TABLET BY MOUTH DAILY 30 tablet 11  . Octreotide Acetate (SANDOSTATIN IJ) Inject 1 each as directed every 30 (thirty) days.     . sennosides-docusate sodium (SENOKOT-S) 8.6-50 MG tablet Take 1 tablet by mouth as needed for constipation.    . sildenafil (VIAGRA) 100 MG tablet TAKE ONE-HALF TO ONE TABLET BY MOUTH DAILY AS NEEDED FOR ERECTILE DYSFUNCTION 5 tablet 10  . simvastatin (ZOCOR) 20 MG tablet TAKE ONE TABLET BY MOUTH DAILY AT 6PM 90 tablet 3   Current Facility-Administered Medications on File Prior to Visit  Medication Dose Route Frequency Provider Last Rate Last Dose  . heparin lock flush 100 unit/mL  500 Units Intravenous Once Betsy Coder B, MD      . sodium chloride flush (NS) 0.9 % injection 10 mL  10 mL Intracatheter PRN Ladell Pier, MD       Review of Systems  Constitutional: Negative for other unusual diaphoresis or sweats HENT: Negative for ear discharge or swelling Eyes: Negative for other worsening visual disturbances  Respiratory: Negative for stridor or other swelling  Gastrointestinal: Negative for worsening distension or other blood Genitourinary: Negative for retention or other urinary change Musculoskeletal: Negative for other MSK pain or swelling Skin: Negative for color change or other new lesions Neurological: Negative for worsening tremors and other numbness  Psychiatric/Behavioral: Negative for worsening agitation or other fatigue All other system neg per pt    Objective:   Physical Exam BP 128/84   Pulse 88   Temp 97.7 F (36.5 C) (Oral)   Ht 5\' 10"  (1.778 m)   Wt 189 lb (85.7 kg)   SpO2 94%   BMI 27.12 kg/m  VS noted,  Constitutional: Pt appears in NAD HENT: Head: NCAT.  Right Ear: External ear normal.  Left Ear: External ear normal.  Eyes: . Pupils are equal, round, and reactive to light. Conjunctivae and EOM are normal  Nose: without d/c or deformity Neck: Neck supple. Gross normal ROM Cardiovascular: Normal rate and regular rhythm.   Pulmonary/Chest: Effort normal and breath sounds without rales or wheezing.  Abd:  Soft, NT, ND, + BS, no organomegaly Spine nontender, but has tight muscular spasm left lower lumbar paravertebral Neurological: Pt is alert. At baseline orientation, motor grossly intact Skin: Skin is warm. No rashes, other new lesions, no LE edema Psychiatric: Pt behavior is normal without agitation  No other exam findings Lab Results  Component Value Date   WBC 3.4 (L) 05/28/2018   HGB 14.9 05/28/2018   HCT 43.9 05/28/2018   PLT 136 (L) 05/28/2018   GLUCOSE 102 (H) 05/28/2018   CHOL 158 06/18/2018   TRIG 97.0 06/18/2018   HDL 52.90 06/18/2018   LDLDIRECT 139.2 10/10/2008   LDLCALC 86 06/18/2018   ALT 22 05/28/2018   AST 25 05/28/2018   NA 140 05/28/2018   K 4.2 05/28/2018   CL 98 05/28/2018   CREATININE 1.06 05/28/2018   BUN 20 05/28/2018   CO2 31 05/28/2018   TSH 1.19 06/18/2018   PSA 1.35 06/18/2018   INR 1.30 04/16/2016   HGBA1C 6.3  06/18/2018          Assessment & Plan:

## 2019-03-29 NOTE — Patient Instructions (Signed)
Please take all new medication as prescribed - the muscle relaxer  OK to continue the topical lidocaine as needed  Please continue all other medications as before, and refills have been done if requested.  Please have the pharmacy call with any other refills you may need.  Please continue your efforts at being more active, low cholesterol diet, and weight control.  Please keep your appointments with your specialists as you may have planned

## 2019-04-02 ENCOUNTER — Encounter: Payer: Self-pay | Admitting: Internal Medicine

## 2019-04-02 NOTE — Assessment & Plan Note (Signed)
Acute onset, c/w msk strain, no neuro changes, for flexeril prn, nsaid prn,  to f/u any worsening symptoms or concerns

## 2019-04-02 NOTE — Assessment & Plan Note (Signed)
stable overall by history and exam, recent data reviewed with pt, and pt to continue medical treatment as before,  to f/u any worsening symptoms or concerns  

## 2019-04-14 ENCOUNTER — Inpatient Hospital Stay: Payer: Medicare Other | Attending: Oncology

## 2019-04-14 ENCOUNTER — Other Ambulatory Visit: Payer: Self-pay

## 2019-04-14 VITALS — BP 128/72 | HR 82 | Temp 98.2°F | Resp 18

## 2019-04-14 DIAGNOSIS — D3A8 Other benign neuroendocrine tumors: Secondary | ICD-10-CM

## 2019-04-14 DIAGNOSIS — C7A8 Other malignant neuroendocrine tumors: Secondary | ICD-10-CM | POA: Diagnosis not present

## 2019-04-14 DIAGNOSIS — C7B8 Other secondary neuroendocrine tumors: Secondary | ICD-10-CM | POA: Insufficient documentation

## 2019-04-14 MED ORDER — OCTREOTIDE ACETATE 30 MG IM KIT
PACK | INTRAMUSCULAR | Status: AC
  Filled 2019-04-14: qty 1

## 2019-04-14 MED ORDER — OCTREOTIDE ACETATE 30 MG IM KIT
30.0000 mg | PACK | Freq: Once | INTRAMUSCULAR | Status: AC
  Administered 2019-04-14: 30 mg via INTRAMUSCULAR

## 2019-05-03 NOTE — Progress Notes (Signed)
Cardiology Office Note   Date:  05/04/2019   ID:  Ronald Thompson, DOB 10-23-1955, MRN [id number]  PCP:  [name], MD  Cardiologist:   Mertie Moores, MD   No chief complaint on file.  Problem list 1. Aortic stenosis - likely had a bicuspid AV  2. Essential hypertension 3. Permanent fibrosis 4. Hyperlipidemia 5. Metastatic seminoma - to his chest ,  S/p chemo and XRT  Took VP 16 - caused some pulmonary fibrosis  6. Neuroendocrine tumur - Pancreatic cancer with mets to Liver .    Notes from 2017:  Ronald Thompson is a 63 y.o. male who presents for evaluation of his aortic stenosis. He has a history of metastatic seminoma up to his chest. He received high-dose chemotherapy and XRT .   It also accompanied some of his heart and extended down to the diaphragm.  2 years ago he was diagnosed with a neuroendocrine tumor-likely to be pancreatic cancer.  Gets saldostatin monthly .  He has some metastases to his liver.   He sees [name]  Has been told that there is no cure, but the growth rate has been slowed dramaticlly .    Has had dyspnea for the past 25 years- since the seminoma was treated .  Has significant DOE but he is able to lift weights.  Does have DOE with walking up hills or climbing stairs.  Does ok on level ground .   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 He had TAVR on April 16, 2016.  He is overall done very well. He has a complex medical history including metastatic pancreatic neuroendocrine tumor.  Still has DOE. And fatigue  Has pulmonary fibrosis from the chemo ( VP 16, bleomycin, radiation therapy)   Still working through the neuroendocrine tumor PET scan in several week  No CP  Does not jog. Works  [employer] on a level surface without difficulities.   BP is typically well controlled.   Sept. 29, 2020  Doing well .   Chronically short of breath  Has  pulmonary fibrosis from chemo     Past Medical History:  Diagnosis Date  . Baker's cyst   . Gallstones   . Heart murmur   . Hypercholesteremia   . Hypertension   . Lesion of left lung    hx.25 yrs ago- testicilar cancer related- only scarring left after tx.-no problems now  . Liver metastasis ([place])   . 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  . Pericarditis    2nd to tumor  . Pneumonia 1990  . Primary neuroendocrine tumor of pancreas 02/14/2014   Stage IV with multiple mets to liver  . Pulmonary fibrosis ([place]) 11/08/2015  . S/P TAVR (transcatheter aortic valve replacement) 04/16/2016   26 mm Edwards Sapien 3 transcatheter heart valve placed via percutaneous right transfemoral approach   . Shortness of breath dyspnea    with exertion and when tired  . 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 Transfusion history    hx. 25 yrs ago-during cancer tx.    Past Surgical History:  Procedure Laterality Date  . BACK SURGERY     '14- rupt. disc   . CARDIAC CATHETERIZATION N/A 01/24/2016   Procedure:  Right/Left Heart Cath and Coronary Angiography;  Surgeon: [name], MD;  Location: [facility];  Service: Cardiovascular;  Laterality: N/A;  . COLONOSCOPY    . EUS N/A 02/10/2014   Procedure: UPPER ENDOSCOPIC ULTRASOUND (EUS) LINEAR;  Surgeon: [name], MD;  Location: [facility];  Service: Endoscopy;  Laterality: N/A;  . IR RADIOLOGIST EVAL & MGMT  02/11/2017  . KNEE SURGERY Right    age 24 for Baker's cyst  . NASAL SEPTUM SURGERY     Deviated septrum  . TEE WITHOUT CARDIOVERSION N/A 04/16/2016   Procedure: TRANSESOPHAGEAL ECHOCARDIOGRAM (TEE);  Surgeon: [name], MD;  Location: [facility];  Service: Open Heart Surgery;  Laterality: N/A;  . THORACOTOMY  1990   wedge biopsy of mediastinal mass  . TRANSCATHETER  AORTIC VALVE REPLACEMENT, TRANSFEMORAL N/A 04/16/2016   Procedure: TRANSCATHETER AORTIC VALVE REPLACEMENT, TRANSFEMORAL;  Surgeon: [name], MD;  Location: [facility];  Service: Open Heart Surgery;  Laterality: N/A;     Current Outpatient Medications  Medication Sig Dispense Refill  . acetaminophen (TYLENOL) 500 MG tablet Take 1,000 mg by mouth every 6 (six) hours as needed for headache.    . albuterol (PROVENTIL HFA;VENTOLIN HFA) 108 (90 Base) MCG/ACT inhaler Inhale 1-2 puffs into the lungs every 6 (six) hours as needed for wheezing or shortness of breath. 1 Inhaler 0  . Blood Pressure Monitoring (BLOOD PRESSURE CUFF) MISC Use as directed 1 each 0  . calcium carbonate (TUMS - DOSED IN MG ELEMENTAL CALCIUM) 500 MG chewable tablet Chew 3 tablets by mouth 3 (three) times daily as needed for indigestion or heartburn.     . clindamycin (CLEOCIN) 300 MG capsule Take 2 capsules by mouth one hour prior to dental appointment 6 capsule 2  . cyclobenzaprine (FLEXERIL) 5 MG tablet Take 1 tablet (5 mg total) by mouth 3 (three) times daily as needed for muscle spasms. 40 tablet 0  . docusate sodium (COLACE) 100 MG capsule Take 100 mg by mouth daily as needed for mild constipation.    Marland Kitchen ibuprofen (ADVIL,MOTRIN) 200 MG tablet Take 400 mg by mouth every 6 (six) hours as needed for headache or mild pain.     Marland Kitchen losartan-hydrochlorothiazide (HYZAAR) 100-25 MG tablet TAKE ONE TABLET BY MOUTH DAILY 30 tablet 11  . Octreotide Acetate (SANDOSTATIN IJ) Inject 1 each as directed every 30 (thirty) days.     . sennosides-docusate sodium (SENOKOT-S) 8.6-50 MG tablet Take 1 tablet by mouth as needed for constipation.    . sildenafil (VIAGRA) 100 MG tablet TAKE ONE-HALF TO ONE TABLET BY MOUTH DAILY AS NEEDED FOR ERECTILE DYSFUNCTION 5 tablet 10  . simvastatin (ZOCOR) 20 MG tablet TAKE ONE TABLET BY MOUTH DAILY AT 6PM 90 tablet 3   No current facility-administered medications for this visit.    Facility-Administered  Medications Ordered in Other Visits  Medication Dose Route Frequency Provider Last Rate Last Dose  . heparin lock flush 100 unit/mL  500 Units Intravenous Once Betsy Coder B, MD      . sodium chloride flush (NS) 0.9 % injection 10 mL  10 mL Intracatheter PRN Ladell Pier, MD        Allergies:   Penicillins    Social History:  The patient  reports that he has never smoked. He has never used smokeless tobacco. He reports current alcohol use. He reports that he does not use drugs.   Family History:  The patient's family history includes Cancer in his maternal aunt and  paternal grandfather; Dementia in his mother; Emphysema in his maternal aunt.    ROS:  Please see the history of present illness.    Physical Exam: Blood pressure 130/88, pulse 73, height 5\' 10"  (1.778 m), weight 190 lb 6.4 oz (86.4 kg), SpO2 99 %.  GEN:  Well nourished, well developed in no acute distress HEENT: Normal NECK: No JVD; No carotid bruits LYMPHATICS: No lymphadenopathy CARDIAC: RRR , soft diastolic murmur  RESPIRATORY:  Clear to auscultation without rales, wheezing or rhonchi  ABDOMEN: Soft, non-tender, non-distended MUSCULOSKELETAL:  No edema; No deformity  SKIN: Warm and dry NEUROLOGIC:  Alert and oriented x 3    EKG: Sept. 29, 2020 :    NSR , NS ST abl.    Recent Labs: 05/28/2018: ALT 22; BUN 20; Creatinine 1.06; Hemoglobin 14.9; Platelet Count 136; Potassium 4.2; Sodium 140 06/18/2018: TSH 1.19    Lipid Panel    Component Value Date/Time   CHOL 158 06/18/2018 1528   TRIG 97.0 06/18/2018 1528   HDL 52.90 06/18/2018 1528   CHOLHDL 3 06/18/2018 1528   VLDL 19.4 06/18/2018 1528   LDLCALC 86 06/18/2018 1528   LDLDIRECT 139.2 10/10/2008 0800      Wt Readings from Last 3 Encounters:  05/04/19 190 lb 6.4 oz (86.4 kg)  03/29/19 189 lb (85.7 kg)  02/10/19 186 lb 8 oz (84.6 kg)      Other studies Reviewed: Additional studies/ records that were reviewed today include: . Review of  the above records demonstrates:    ASSESSMENT AND PLAN:  1.  Aortic stenosis:   Status post TAVR.  Seems to be doing very well.  2.  Hypertension:   Blood pressure is well controlled.  Continue current medications  3.  Pulmonic insufficiency: Patient has pulmonic insufficiency.  The TAVR seems to be working well.   Current medicines are reviewed at length with the patient today.  The patient does not have concerns regarding medicines.  The following changes have been made:  no change  Labs/ tests ordered today include:   Orders Placed This Encounter  Procedures  . EKG 12-Lead       Mertie Moores, MD  05/04/2019 8:11 AM    [facility] [place], [place],   [address] Phone: [phone]; Fax: [phone]

## 2019-05-04 ENCOUNTER — Other Ambulatory Visit: Payer: Self-pay

## 2019-05-04 ENCOUNTER — Ambulatory Visit (INDEPENDENT_AMBULATORY_CARE_PROVIDER_SITE_OTHER): Payer: Medicare Other | Admitting: Cardiovascular Disease

## 2019-05-04 ENCOUNTER — Encounter: Payer: Self-pay | Admitting: Cardiovascular Disease

## 2019-05-04 ENCOUNTER — Encounter (INDEPENDENT_AMBULATORY_CARE_PROVIDER_SITE_OTHER): Payer: Self-pay

## 2019-05-04 VITALS — BP 130/88 | HR 73 | Ht 70.0 in | Wt 190.4 lb

## 2019-05-04 DIAGNOSIS — I10 Essential (primary) hypertension: Secondary | ICD-10-CM

## 2019-05-04 DIAGNOSIS — J8410 Pulmonary fibrosis, unspecified: Secondary | ICD-10-CM | POA: Diagnosis not present

## 2019-05-04 DIAGNOSIS — I350 Nonrheumatic aortic (valve) stenosis: Secondary | ICD-10-CM | POA: Diagnosis not present

## 2019-05-04 DIAGNOSIS — Z952 Presence of prosthetic heart valve: Secondary | ICD-10-CM | POA: Diagnosis not present

## 2019-05-04 NOTE — Patient Instructions (Signed)
Medication Instructions:  Your provider recommends that you continue on your current medications as directed. Please refer to the Current Medication list given to you today.    Labwork: None  Testing/Procedures: None  Follow-Up: Your provider wants you to follow-up in: 1 year with Dr. Acie Fredrickson. You will receive a reminder letter in the mail two months in advance. If you don't receive a letter, please call our office to schedule the follow-up appointment.    Any Other Special Instructions Will Be Listed Below (If Applicable).     If you need a refill on your cardiac medications before your next appointment, please call your pharmacy.

## 2019-05-12 ENCOUNTER — Inpatient Hospital Stay: Payer: Medicare Other | Attending: Oncology

## 2019-05-12 ENCOUNTER — Other Ambulatory Visit: Payer: Self-pay

## 2019-05-12 ENCOUNTER — Inpatient Hospital Stay: Payer: Medicare Other

## 2019-05-12 VITALS — BP 137/81 | HR 67 | Temp 98.2°F | Resp 18

## 2019-05-12 DIAGNOSIS — C7B8 Other secondary neuroendocrine tumors: Secondary | ICD-10-CM | POA: Insufficient documentation

## 2019-05-12 DIAGNOSIS — C7A8 Other malignant neuroendocrine tumors: Secondary | ICD-10-CM | POA: Insufficient documentation

## 2019-05-12 DIAGNOSIS — D3A8 Other benign neuroendocrine tumors: Secondary | ICD-10-CM

## 2019-05-12 LAB — CMP (CANCER CENTER ONLY)
ALT: 25 U/L (ref 0–44)
AST: 28 U/L (ref 15–41)
Albumin: 4.1 g/dL (ref 3.5–5.0)
Alkaline Phosphatase: 59 U/L (ref 38–126)
Anion gap: 6 (ref 5–15)
BUN: 21 mg/dL (ref 8–23)
CO2: 30 mmol/L (ref 22–32)
Calcium: 9.2 mg/dL (ref 8.9–10.3)
Chloride: 102 mmol/L (ref 98–111)
Creatinine: 1.11 mg/dL (ref 0.61–1.24)
GFR, Est AFR Am: >60 mL/min (ref >60)
GFR, Estimated: >60 mL/min (ref >60)
Glucose, Bld: 102 mg/dL — ABNORMAL HIGH (ref 70–99)
Potassium: 4.7 mmol/L (ref 3.5–5.1)
Sodium: 138 mmol/L (ref 135–145)
Total Bilirubin: 1 mg/dL (ref 0.3–1.2)
Total Protein: 6.8 g/dL (ref 6.5–8.1)

## 2019-05-12 LAB — CBC WITH DIFFERENTIAL (CANCER CENTER ONLY)
Abs Immature Granulocytes: 0.01 10*3/uL (ref 0.00–0.07)
Basophils Absolute: 0 10*3/uL (ref 0.0–0.1)
Basophils Relative: 0 %
Eosinophils Absolute: 0.1 10*3/uL (ref 0.0–0.5)
Eosinophils Relative: 3 %
HCT: 43.1 % (ref 39.0–52.0)
Hemoglobin: 14.7 g/dL (ref 13.0–17.0)
Immature Granulocytes: 0 %
Lymphocytes Relative: 13 %
Lymphs Abs: 0.5 10*3/uL — ABNORMAL LOW (ref 0.7–4.0)
MCH: 32.3 pg (ref 26.0–34.0)
MCHC: 34.1 g/dL (ref 30.0–36.0)
MCV: 94.7 fL (ref 80.0–100.0)
Monocytes Absolute: 0.5 10*3/uL (ref 0.1–1.0)
Monocytes Relative: 13 %
Neutro Abs: 2.6 10*3/uL (ref 1.7–7.7)
Neutrophils Relative %: 71 %
Platelet Count: 136 10*3/uL — ABNORMAL LOW (ref 150–400)
RBC: 4.55 MIL/uL (ref 4.22–5.81)
RDW: 12.2 % (ref 11.5–15.5)
WBC Count: 3.7 10*3/uL — ABNORMAL LOW (ref 4.0–10.5)
nRBC: 0 % (ref 0.0–0.2)

## 2019-05-12 MED ORDER — OCTREOTIDE ACETATE 30 MG IM KIT
PACK | INTRAMUSCULAR | Status: AC
  Filled 2019-05-12: qty 1

## 2019-05-12 MED ORDER — OCTREOTIDE ACETATE 30 MG IM KIT
30.0000 mg | PACK | Freq: Once | INTRAMUSCULAR | Status: AC
  Administered 2019-05-12: 30 mg via INTRAMUSCULAR

## 2019-05-12 NOTE — Patient Instructions (Signed)
Octreotide injection solution °What is this medicine? °OCTREOTIDE (ok TREE oh tide) is used to reduce blood levels of growth hormone in patients with a condition called acromegaly. This medicine also reduces flushing and watery diarrhea caused by certain types of cancer. °This medicine may be used for other purposes; ask your health care provider or pharmacist if you have questions. °COMMON BRAND NAME(S): Bynfezia, Sandostatin, Sandostatin LAR °What should I tell my health care provider before I take this medicine? °They need to know if you have any of these conditions: °· diabetes °· gallbladder disease °· kidney disease °· liver disease °· an unusual or allergic reaction to octreotide, other medicines, foods, dyes, or preservatives °· pregnant or trying to get pregnant °· breast-feeding °How should I use this medicine? °This medicine is for injection under the skin or into a vein (only in emergency situations). It is usually given by a health care professional in a hospital or clinic setting. °If you get this medicine at home, you will be taught how to prepare and give this medicine. Allow the injection solution to come to room temperature before use. Do not warm it artificially. Use exactly as directed. Take your medicine at regular intervals. Do not take your medicine more often than directed. °It is important that you put your used needles and syringes in a special sharps container. Do not put them in a trash can. If you do not have a sharps container, call your pharmacist or healthcare provider to get one. °Talk to your pediatrician regarding the use of this medicine in children. Special care may be needed. °Overdosage: If you think you have taken too much of this medicine contact a poison control center or emergency room at once. °NOTE: This medicine is only for you. Do not share this medicine with others. °What if I miss a dose? °If you miss a dose, take it as soon as you can. If it is almost time for your  next dose, take only that dose. Do not take double or extra doses. °What may interact with this medicine? °Do not take this medicine with any of the following medications: °· cisapride °· droperidol °· general anesthetics °· grepafloxacin °· perphenazine °· thioridazine °This medicine may also interact with the following medications: °· bromocriptine °· cyclosporine °· diuretics °· medicines for blood pressure, heart disease, irregular heart beat °· medicines for diabetes, including insulin °· quinidine °This list may not describe all possible interactions. Give your health care provider a list of all the medicines, herbs, non-prescription drugs, or dietary supplements you use. Also tell them if you smoke, drink alcohol, or use illegal drugs. Some items may interact with your medicine. °What should I watch for while using this medicine? °Visit your doctor or health care professional for regular checks on your progress. °To help reduce irritation at the injection site, use a different site for each injection and make sure the solution is at room temperature before use. °This medicine may cause decreases in blood sugar. Signs of low blood sugar include chills, cool, pale skin or cold sweats, drowsiness, extreme hunger, fast heartbeat, headache, nausea, nervousness or anxiety, shakiness, trembling, unsteadiness, tiredness, or weakness. Contact your doctor or health care professional right away if you experience any of these symptoms. °This medicine may increase blood sugar. Ask your healthcare provider if changes in diet or medicines are needed if you have diabetes. °This medicine may cause a decrease in vitamin B12. You should make sure that you get enough vitamin B12   while you are taking this medicine. Discuss the foods you eat and the vitamins you take with your health care professional. °What side effects may I notice from receiving this medicine? °Side effects that you should report to your doctor or health care  professional as soon as possible: °· allergic reactions like skin rash, itching or hives, swelling of the face, lips, or tongue °· decreases in blood sugar °· changes in heart rate °· severe stomach pain °·  °signs and symptoms of high blood sugar such as being more thirsty or hungry or having to urinate more than normal. You may also feel very tired or have blurry vision. °Side effects that usually do not require medical attention (report to your doctor or health care professional if they continue or are bothersome): °· diarrhea or constipation °· gas or stomach pain °· nausea, vomiting °· pain, redness, swelling and irritation at site where injected °This list may not describe all possible side effects. Call your doctor for medical advice about side effects. You may report side effects to FDA at 1-800-FDA-1088. °Where should I keep my medicine? °Keep out of the reach of children. °Store in a refrigerator between 2 and 8 degrees C (36 and 46 degrees F). Protect from light. Allow to come to room temperature naturally. Do not use artificial heat. If protected from light, the injection may be stored at room temperature between 20 and 30 degrees C (70 and 86 degrees F) for 14 days. After the initial use, throw away any unused portion of a multiple dose vial after 14 days. Throw away unused portions of the ampules after use. °NOTE: This sheet is a summary. It may not cover all possible information. If you have questions about this medicine, talk to your doctor, pharmacist, or health care provider. °© 2020 [employer] (2018-04-30 08:07:09) ° °

## 2019-05-13 DIAGNOSIS — H16223 Keratoconjunctivitis sicca, not specified as Sjogren's, bilateral: Secondary | ICD-10-CM | POA: Diagnosis not present

## 2019-06-09 ENCOUNTER — Inpatient Hospital Stay: Payer: Medicare Other

## 2019-06-09 ENCOUNTER — Telehealth: Payer: Self-pay | Admitting: Oncology

## 2019-06-09 ENCOUNTER — Other Ambulatory Visit: Payer: Self-pay

## 2019-06-09 ENCOUNTER — Inpatient Hospital Stay: Payer: Medicare Other | Attending: Oncology | Admitting: Oncology

## 2019-06-09 VITALS — BP 126/86 | HR 80 | Temp 98.0°F | Resp 18 | Ht 70.0 in | Wt 183.9 lb

## 2019-06-09 DIAGNOSIS — D3A8 Other benign neuroendocrine tumors: Secondary | ICD-10-CM | POA: Diagnosis not present

## 2019-06-09 DIAGNOSIS — Z23 Encounter for immunization: Secondary | ICD-10-CM | POA: Diagnosis not present

## 2019-06-09 DIAGNOSIS — C7A8 Other malignant neuroendocrine tumors: Secondary | ICD-10-CM | POA: Insufficient documentation

## 2019-06-09 DIAGNOSIS — C7B8 Other secondary neuroendocrine tumors: Secondary | ICD-10-CM | POA: Insufficient documentation

## 2019-06-09 MED ORDER — OCTREOTIDE ACETATE 30 MG IM KIT
30.0000 mg | PACK | Freq: Once | INTRAMUSCULAR | Status: AC
  Administered 2019-06-09: 30 mg via INTRAMUSCULAR

## 2019-06-09 MED ORDER — INFLUENZA VAC A&B SA ADJ QUAD 0.5 ML IM PRSY
PREFILLED_SYRINGE | INTRAMUSCULAR | Status: AC
  Filled 2019-06-09: qty 0.5

## 2019-06-09 MED ORDER — OCTREOTIDE ACETATE 30 MG IM KIT
PACK | INTRAMUSCULAR | Status: AC
  Filled 2019-06-09: qty 1

## 2019-06-09 MED ORDER — INFLUENZA VAC A&B SA ADJ QUAD 0.5 ML IM PRSY
0.5000 mL | PREFILLED_SYRINGE | Freq: Once | INTRAMUSCULAR | Status: AC
  Administered 2019-06-09: 11:00:00 0.5 mL via INTRAMUSCULAR

## 2019-06-09 NOTE — Progress Notes (Signed)
[place] OFFICE PROGRESS NOTE   Diagnosis: Pancreas neuroendocrine tumor  INTERVAL HISTORY:   [name] returns as scheduled.  He continues monthly Sandostatin.  He is exercising.  No diarrhea.  He had an episode of feeling "shaky "prior to eating several days ago.  This resolved after eating.  He is tolerating the Sandostatin injections well.  Objective:  Vital signs in last 24 hours:  Blood pressure 126/86, pulse 80, temperature 98 F (36.7 C), temperature source Temporal, resp. rate 18, height 5\' 10"  (1.778 m), weight 183 lb 14.4 oz (83.4 kg), SpO2 97 %.   Resp: Decreased breath sounds at the left lower posterior chest and left upper anterior chest, no respiratory distress Cardio: Regular rate and rhythm, 2/6 diastolic murmur GI: No mass, nontender, no hepatosplenomegaly Vascular: No leg edema  Lab Results:  Lab Results  Component Value Date   WBC 3.7 (L) 05/12/2019   HGB 14.7 05/12/2019   HCT 43.1 05/12/2019   MCV 94.7 05/12/2019   PLT 136 (L) 05/12/2019   NEUTROABS 2.6 05/12/2019    CMP  Lab Results  Component Value Date   NA 138 05/12/2019   K 4.7 05/12/2019   CL 102 05/12/2019   CO2 30 05/12/2019   GLUCOSE 102 (H) 05/12/2019   BUN 21 05/12/2019   CREATININE 1.11 05/12/2019   CALCIUM 9.2 05/12/2019   PROT 6.8 05/12/2019   ALBUMIN 4.1 05/12/2019   AST 28 05/12/2019   ALT 25 05/12/2019   ALKPHOS 59 05/12/2019   BILITOT 1.0 05/12/2019   GFRNONAA >60 05/12/2019   GFRAA >60 05/12/2019     Medications: I have reviewed the patient's current medications.   Assessment/Plan:  1. Pancreatic neuroendocrine tumor, WHO grade 2, pancreatic head mass and small peripancreatic/celiac nodes on a CT 02/04/2014  EUS revealed evidence of multiple liver metastases-status post an FNA biopsy of a left liver lesion 02/10/2014 confirming a neuroendocrine tumor   Octreotide scan 04/01/2014 with multiple foci of metastatic neuroendocrine tumor in the liver   Monthly Sandostatin started 03/16/2014  Restaging CT 07/04/2014 with resolution of a previously noted pancreas head lesion and probable progression of liver metastases  Restaging CT 10/31/2014-stable liver lesions, no evidence of a pancreas mass, stable.  Restaging CT 02/28/2015-stable hepatic metastases, stable pancreas lesion unchanged  Restaging CT 08/30/2015-stable pancreas mass, slight enlargement of hepatic metastases  Monthly Sandostatin continued  Restaging CTs06/14/2017 revealed enlargement of liver lesions, no new lesions, enlargement of the pancreas head mass  Gallium DOTATATEscan 10/01/2016 confirmed uptake in the liver metastases, pancreas primary, peripancreatic adenopathy, and left iliac bone  Cycle 1 Lutathera 03/05/2017  Cycle 2Lutathera09/26/2018  Cycle 3 Lutathera 07/02/2017  Cycle 4 Lutathera 08/27/2017  Netspotscan 09/29/2017-decreased radiotracer activity within the pancreas head, peripancreatic lymph node, and solitary skeletal lesion. Liver lesions have increased in size with a decrease in radiotracer activity  Monthly Sandostatin continued  Netspot scan 05/20/2018-stable to decreased size of liver lesions, most have decreased SUV, similar uptake in the pancreas lesion, previously noted peripancreatic lymph node has resolved him a mild decrease in uptake associated with a left iliac metastasis, no evidence of disease progression  Monthly Sandostatin continued  Netspot scan 01/29/2019- overall stable to improved, 1 lesion left hepatic lobe has increased tracer activity-unchanged in size, majority of hepatic lesions have reduced activity compared to the pretreatment scan, decreased activity left iliac bone lesion, stable activity in the head of the pancreas lesion, no new lesions  Monthly Sandostatin continue  2. Chest Seminoma in  1990 treated with BEP and chest radiation at [facility] 3. 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 chronic exertional dyspnea following treatment for the seminoma  5. aortic stenosis on an echocardiogram December 2011, severe aortic stenosis on echocardiogram 12/06/2015, status postTAVR on 04/16/2016 6. lumbar disc surgery 2014  7. acute abdominal pain 02/04/2014-resolved    Disposition: [name] appears unchanged.  He continues monthly Sandostatin.  He will continue monthly Sandostatin.  He will be scheduled for a restaging Netspot study and office visit in 4 months.  He received an influenza vaccine today.  Betsy Coder, MD  06/09/2019  9:15 AM

## 2019-06-09 NOTE — Telephone Encounter (Signed)
Scheduled per 11/04 los, patient received after visit summary and calender.

## 2019-06-09 NOTE — Patient Instructions (Addendum)
Octreotide injection solution What is this medicine? OCTREOTIDE (ok TREE oh tide) is used to reduce blood levels of growth hormone in patients with a condition called acromegaly. This medicine also reduces flushing and watery diarrhea caused by certain types of cancer. This medicine may be used for other purposes; ask your health care provider or pharmacist if you have questions. COMMON BRAND NAME(S): Bynfezia, Sandostatin, Sandostatin LAR What should I tell my health care provider before I take this medicine? They need to know if you have any of these conditions:  diabetes  gallbladder disease  kidney disease  liver disease  an unusual or allergic reaction to octreotide, other medicines, foods, dyes, or preservatives  pregnant or trying to get pregnant  breast-feeding How should I use this medicine? This medicine is for injection under the skin or into a vein (only in emergency situations). It is usually given by a health care professional in a hospital or clinic setting. If you get this medicine at home, you will be taught how to prepare and give this medicine. Allow the injection solution to come to room temperature before use. Do not warm it artificially. Use exactly as directed. Take your medicine at regular intervals. Do not take your medicine more often than directed. It is important that you put your used needles and syringes in a special sharps container. Do not put them in a trash can. If you do not have a sharps container, call your pharmacist or healthcare provider to get one. Talk to your pediatrician regarding the use of this medicine in children. Special care may be needed. Overdosage: If you think you have taken too much of this medicine contact a poison control center or emergency room at once. NOTE: This medicine is only for you. Do not share this medicine with others. What if I miss a dose? If you miss a dose, take it as soon as you can. If it is almost time for your  next dose, take only that dose. Do not take double or extra doses. What may interact with this medicine? Do not take this medicine with any of the following medications:  cisapride  droperidol  general anesthetics  grepafloxacin  perphenazine  thioridazine This medicine may also interact with the following medications:  bromocriptine  cyclosporine  diuretics  medicines for blood pressure, heart disease, irregular heart beat  medicines for diabetes, including insulin  quinidine This list may not describe all possible interactions. Give your health care provider a list of all the medicines, herbs, non-prescription drugs, or dietary supplements you use. Also tell them if you smoke, drink alcohol, or use illegal drugs. Some items may interact with your medicine. What should I watch for while using this medicine? Visit your doctor or health care professional for regular checks on your progress. To help reduce irritation at the injection site, use a different site for each injection and make sure the solution is at room temperature before use. This medicine may cause decreases in blood sugar. Signs of low blood sugar include chills, cool, pale skin or cold sweats, drowsiness, extreme hunger, fast heartbeat, headache, nausea, nervousness or anxiety, shakiness, trembling, unsteadiness, tiredness, or weakness. Contact your doctor or health care professional right away if you experience any of these symptoms. This medicine may increase blood sugar. Ask your healthcare provider if changes in diet or medicines are needed if you have diabetes. This medicine may cause a decrease in vitamin B12. You should make sure that you get enough vitamin B12  while you are taking this medicine. Discuss the foods you eat and the vitamins you take with your health care professional. What side effects may I notice from receiving this medicine? Side effects that you should report to your doctor or health care  professional as soon as possible:  allergic reactions like skin rash, itching or hives, swelling of the face, lips, or tongue  decreases in blood sugar  changes in heart rate  severe stomach pain   signs and symptoms of high blood sugar such as being more thirsty or hungry or having to urinate more than normal. You may also feel very tired or have blurry vision. Side effects that usually do not require medical attention (report to your doctor or health care professional if they continue or are bothersome):  diarrhea or constipation  gas or stomach pain  nausea, vomiting  pain, redness, swelling and irritation at site where injected This list may not describe all possible side effects. Call your doctor for medical advice about side effects. You may report side effects to FDA at 1-800-FDA-1088. Where should I keep my medicine? Keep out of the reach of children. Store in a refrigerator between 2 and 8 degrees C (36 and 46 degrees F). Protect from light. Allow to come to room temperature naturally. Do not use artificial heat. If protected from light, the injection may be stored at room temperature between 20 and 30 degrees C (70 and 86 degrees F) for 14 days. After the initial use, throw away any unused portion of a multiple dose vial after 14 days. Throw away unused portions of the ampules after use. NOTE: This sheet is a summary. It may not cover all possible information. If you have questions about this medicine, talk to your doctor, pharmacist, or health care provider.  2020 Elsevier/Gold Standard (2018-04-30 08:07:09) Influenza Virus Vaccine injection What is this medicine? INFLUENZA VIRUS VACCINE (in floo EN zuh VAHY ruhs vak SEEN) helps to reduce the risk of getting influenza also known as the flu. The vaccine only helps protect you against some strains of the flu. This medicine may be used for other purposes; ask your health care provider or pharmacist if you have  questions. COMMON BRAND NAME(S): Afluria, Afluria Quadrivalent, Agriflu, Alfuria, FLUAD, Fluarix, Fluarix Quadrivalent, Flublok, Flublok Quadrivalent, FLUCELVAX, Flulaval, Fluvirin, Fluzone, Fluzone High-Dose, Fluzone Intradermal What should I tell my health care provider before I take this medicine? They need to know if you have any of these conditions:  bleeding disorder like hemophilia  fever or infection  Guillain-Barre syndrome or other neurological problems  immune system problems  infection with the human immunodeficiency virus (HIV) or AIDS  low blood platelet counts  multiple sclerosis  an unusual or allergic reaction to influenza virus vaccine, latex, other medicines, foods, dyes, or preservatives. Different brands of vaccines contain different allergens. Some may contain latex or eggs. Talk to your doctor about your allergies to make sure that you get the right vaccine.  pregnant or trying to get pregnant  breast-feeding How should I use this medicine? This vaccine is for injection into a muscle or under the skin. It is given by a health care professional. A copy of Vaccine Information Statements will be given before each vaccination. Read this sheet carefully each time. The sheet may change frequently. Talk to your healthcare provider to see which vaccines are right for you. Some vaccines should not be used in all age groups. Overdosage: If you think you have taken too much of  this medicine contact a poison control center or emergency room at once. NOTE: This medicine is only for you. Do not share this medicine with others. What if I miss a dose? This does not apply. What may interact with this medicine?  chemotherapy or radiation therapy  medicines that lower your immune system like etanercept, anakinra, infliximab, and adalimumab  medicines that treat or prevent blood clots like warfarin  phenytoin  steroid medicines like prednisone or  cortisone  theophylline  vaccines This list may not describe all possible interactions. Give your health care provider a list of all the medicines, herbs, non-prescription drugs, or dietary supplements you use. Also tell them if you smoke, drink alcohol, or use illegal drugs. Some items may interact with your medicine. What should I watch for while using this medicine? Report any side effects that do not go away within 3 days to your doctor or health care professional. Call your health care provider if any unusual symptoms occur within 6 weeks of receiving this vaccine. You may still catch the flu, but the illness is not usually as bad. You cannot get the flu from the vaccine. The vaccine will not protect against colds or other illnesses that may cause fever. The vaccine is needed every year. What side effects may I notice from receiving this medicine? Side effects that you should report to your doctor or health care professional as soon as possible:  allergic reactions like skin rash, itching or hives, swelling of the face, lips, or tongue Side effects that usually do not require medical attention (report to your doctor or health care professional if they continue or are bothersome):  fever  headache  muscle aches and pains  pain, tenderness, redness, or swelling at the injection site  tiredness This list may not describe all possible side effects. Call your doctor for medical advice about side effects. You may report side effects to FDA at 1-800-FDA-1088. Where should I keep my medicine? The vaccine will be given by a health care professional in a clinic, pharmacy, doctor's office, or other health care setting. You will not be given vaccine doses to store at home. NOTE: This sheet is a summary. It may not cover all possible information. If you have questions about this medicine, talk to your doctor, pharmacist, or health care provider.  2020 Elsevier/Gold Standard (2018-06-16  08:45:43)

## 2019-06-25 ENCOUNTER — Other Ambulatory Visit: Payer: Self-pay

## 2019-06-25 DIAGNOSIS — Z20822 Contact with and (suspected) exposure to covid-19: Secondary | ICD-10-CM

## 2019-06-25 DIAGNOSIS — Z20828 Contact with and (suspected) exposure to other viral communicable diseases: Secondary | ICD-10-CM | POA: Diagnosis not present

## 2019-06-28 LAB — NOVEL CORONAVIRUS, NAA: SARS-CoV-2, NAA: NOT DETECTED

## 2019-07-07 ENCOUNTER — Other Ambulatory Visit: Payer: Self-pay

## 2019-07-07 ENCOUNTER — Inpatient Hospital Stay: Payer: Medicare Other | Attending: Oncology

## 2019-07-07 VITALS — BP 130/79 | HR 77 | Temp 98.0°F | Resp 18

## 2019-07-07 DIAGNOSIS — C7A8 Other malignant neuroendocrine tumors: Secondary | ICD-10-CM | POA: Insufficient documentation

## 2019-07-07 DIAGNOSIS — C7B8 Other secondary neuroendocrine tumors: Secondary | ICD-10-CM | POA: Diagnosis present

## 2019-07-07 DIAGNOSIS — D3A8 Other benign neuroendocrine tumors: Secondary | ICD-10-CM

## 2019-07-07 MED ORDER — OCTREOTIDE ACETATE 30 MG IM KIT
30.0000 mg | PACK | Freq: Once | INTRAMUSCULAR | Status: AC
  Administered 2019-07-07: 30 mg via INTRAMUSCULAR

## 2019-07-07 MED ORDER — OCTREOTIDE ACETATE 30 MG IM KIT
PACK | INTRAMUSCULAR | Status: AC
  Filled 2019-07-07: qty 1

## 2019-07-07 NOTE — Patient Instructions (Signed)
Octreotide injection solution °What is this medicine? °OCTREOTIDE (ok TREE oh tide) is used to reduce blood levels of growth hormone in patients with a condition called acromegaly. This medicine also reduces flushing and watery diarrhea caused by certain types of cancer. °This medicine may be used for other purposes; ask your health care provider or pharmacist if you have questions. °COMMON BRAND NAME(S): Bynfezia, Sandostatin, Sandostatin LAR °What should I tell my health care provider before I take this medicine? °They need to know if you have any of these conditions: °· diabetes °· gallbladder disease °· kidney disease °· liver disease °· an unusual or allergic reaction to octreotide, other medicines, foods, dyes, or preservatives °· pregnant or trying to get pregnant °· breast-feeding °How should I use this medicine? °This medicine is for injection under the skin or into a vein (only in emergency situations). It is usually given by a health care professional in a hospital or clinic setting. °If you get this medicine at home, you will be taught how to prepare and give this medicine. Allow the injection solution to come to room temperature before use. Do not warm it artificially. Use exactly as directed. Take your medicine at regular intervals. Do not take your medicine more often than directed. °It is important that you put your used needles and syringes in a special sharps container. Do not put them in a trash can. If you do not have a sharps container, call your pharmacist or healthcare provider to get one. °Talk to your pediatrician regarding the use of this medicine in children. Special care may be needed. °Overdosage: If you think you have taken too much of this medicine contact a poison control center or emergency room at once. °NOTE: This medicine is only for you. Do not share this medicine with others. °What if I miss a dose? °If you miss a dose, take it as soon as you can. If it is almost time for your  next dose, take only that dose. Do not take double or extra doses. °What may interact with this medicine? °Do not take this medicine with any of the following medications: °· cisapride °· droperidol °· general anesthetics °· grepafloxacin °· perphenazine °· thioridazine °This medicine may also interact with the following medications: °· bromocriptine °· cyclosporine °· diuretics °· medicines for blood pressure, heart disease, irregular heart beat °· medicines for diabetes, including insulin °· quinidine °This list may not describe all possible interactions. Give your health care provider a list of all the medicines, herbs, non-prescription drugs, or dietary supplements you use. Also tell them if you smoke, drink alcohol, or use illegal drugs. Some items may interact with your medicine. °What should I watch for while using this medicine? °Visit your doctor or health care professional for regular checks on your progress. °To help reduce irritation at the injection site, use a different site for each injection and make sure the solution is at room temperature before use. °This medicine may cause decreases in blood sugar. Signs of low blood sugar include chills, cool, pale skin or cold sweats, drowsiness, extreme hunger, fast heartbeat, headache, nausea, nervousness or anxiety, shakiness, trembling, unsteadiness, tiredness, or weakness. Contact your doctor or health care professional right away if you experience any of these symptoms. °This medicine may increase blood sugar. Ask your healthcare provider if changes in diet or medicines are needed if you have diabetes. °This medicine may cause a decrease in vitamin B12. You should make sure that you get enough vitamin B12   while you are taking this medicine. Discuss the foods you eat and the vitamins you take with your health care professional. °What side effects may I notice from receiving this medicine? °Side effects that you should report to your doctor or health care  professional as soon as possible: °· allergic reactions like skin rash, itching or hives, swelling of the face, lips, or tongue °· decreases in blood sugar °· changes in heart rate °· severe stomach pain °·  °signs and symptoms of high blood sugar such as being more thirsty or hungry or having to urinate more than normal. You may also feel very tired or have blurry vision. °Side effects that usually do not require medical attention (report to your doctor or health care professional if they continue or are bothersome): °· diarrhea or constipation °· gas or stomach pain °· nausea, vomiting °· pain, redness, swelling and irritation at site where injected °This list may not describe all possible side effects. Call your doctor for medical advice about side effects. You may report side effects to FDA at 1-800-FDA-1088. °Where should I keep my medicine? °Keep out of the reach of children. °Store in a refrigerator between 2 and 8 degrees C (36 and 46 degrees F). Protect from light. Allow to come to room temperature naturally. Do not use artificial heat. If protected from light, the injection may be stored at room temperature between 20 and 30 degrees C (70 and 86 degrees F) for 14 days. After the initial use, throw away any unused portion of a multiple dose vial after 14 days. Throw away unused portions of the ampules after use. °NOTE: This sheet is a summary. It may not cover all possible information. If you have questions about this medicine, talk to your doctor, pharmacist, or health care provider. °© 2020 [employer] (2018-04-30 08:07:09) ° °

## 2019-07-20 DIAGNOSIS — M71572 Other bursitis, not elsewhere classified, left ankle and foot: Secondary | ICD-10-CM | POA: Diagnosis not present

## 2019-07-20 DIAGNOSIS — M7662 Achilles tendinitis, left leg: Secondary | ICD-10-CM | POA: Diagnosis not present

## 2019-08-04 ENCOUNTER — Other Ambulatory Visit: Payer: Self-pay

## 2019-08-04 ENCOUNTER — Inpatient Hospital Stay: Payer: Medicare Other

## 2019-08-04 VITALS — BP 140/74 | HR 88 | Temp 98.0°F | Resp 20

## 2019-08-04 DIAGNOSIS — C7A8 Other malignant neuroendocrine tumors: Secondary | ICD-10-CM | POA: Diagnosis not present

## 2019-08-04 DIAGNOSIS — D3A8 Other benign neuroendocrine tumors: Secondary | ICD-10-CM

## 2019-08-04 MED ORDER — OCTREOTIDE ACETATE 30 MG IM KIT
30.0000 mg | PACK | Freq: Once | INTRAMUSCULAR | Status: AC
  Administered 2019-08-04: 30 mg via INTRAMUSCULAR

## 2019-08-04 MED ORDER — OCTREOTIDE ACETATE 30 MG IM KIT
PACK | INTRAMUSCULAR | Status: AC
  Filled 2019-08-04: qty 1

## 2019-08-04 NOTE — Patient Instructions (Signed)
Octreotide injection solution °What is this medicine? °OCTREOTIDE (ok TREE oh tide) is used to reduce blood levels of growth hormone in patients with a condition called acromegaly. This medicine also reduces flushing and watery diarrhea caused by certain types of cancer. °This medicine may be used for other purposes; ask your health care provider or pharmacist if you have questions. °COMMON BRAND NAME(S): Bynfezia, Sandostatin, Sandostatin LAR °What should I tell my health care provider before I take this medicine? °They need to know if you have any of these conditions: °· diabetes °· gallbladder disease °· kidney disease °· liver disease °· an unusual or allergic reaction to octreotide, other medicines, foods, dyes, or preservatives °· pregnant or trying to get pregnant °· breast-feeding °How should I use this medicine? °This medicine is for injection under the skin or into a vein (only in emergency situations). It is usually given by a health care professional in a hospital or clinic setting. °If you get this medicine at home, you will be taught how to prepare and give this medicine. Allow the injection solution to come to room temperature before use. Do not warm it artificially. Use exactly as directed. Take your medicine at regular intervals. Do not take your medicine more often than directed. °It is important that you put your used needles and syringes in a special sharps container. Do not put them in a trash can. If you do not have a sharps container, call your pharmacist or healthcare provider to get one. °Talk to your pediatrician regarding the use of this medicine in children. Special care may be needed. °Overdosage: If you think you have taken too much of this medicine contact a poison control center or emergency room at once. °NOTE: This medicine is only for you. Do not share this medicine with others. °What if I miss a dose? °If you miss a dose, take it as soon as you can. If it is almost time for your  next dose, take only that dose. Do not take double or extra doses. °What may interact with this medicine? °Do not take this medicine with any of the following medications: °· cisapride °· droperidol °· general anesthetics °· grepafloxacin °· perphenazine °· thioridazine °This medicine may also interact with the following medications: °· bromocriptine °· cyclosporine °· diuretics °· medicines for blood pressure, heart disease, irregular heart beat °· medicines for diabetes, including insulin °· quinidine °This list may not describe all possible interactions. Give your health care provider a list of all the medicines, herbs, non-prescription drugs, or dietary supplements you use. Also tell them if you smoke, drink alcohol, or use illegal drugs. Some items may interact with your medicine. °What should I watch for while using this medicine? °Visit your doctor or health care professional for regular checks on your progress. °To help reduce irritation at the injection site, use a different site for each injection and make sure the solution is at room temperature before use. °This medicine may cause decreases in blood sugar. Signs of low blood sugar include chills, cool, pale skin or cold sweats, drowsiness, extreme hunger, fast heartbeat, headache, nausea, nervousness or anxiety, shakiness, trembling, unsteadiness, tiredness, or weakness. Contact your doctor or health care professional right away if you experience any of these symptoms. °This medicine may increase blood sugar. Ask your healthcare provider if changes in diet or medicines are needed if you have diabetes. °This medicine may cause a decrease in vitamin B12. You should make sure that you get enough vitamin B12   while you are taking this medicine. Discuss the foods you eat and the vitamins you take with your health care professional. °What side effects may I notice from receiving this medicine? °Side effects that you should report to your doctor or health care  professional as soon as possible: °· allergic reactions like skin rash, itching or hives, swelling of the face, lips, or tongue °· decreases in blood sugar °· changes in heart rate °· severe stomach pain °·  °signs and symptoms of high blood sugar such as being more thirsty or hungry or having to urinate more than normal. You may also feel very tired or have blurry vision. °Side effects that usually do not require medical attention (report to your doctor or health care professional if they continue or are bothersome): °· diarrhea or constipation °· gas or stomach pain °· nausea, vomiting °· pain, redness, swelling and irritation at site where injected °This list may not describe all possible side effects. Call your doctor for medical advice about side effects. You may report side effects to FDA at 1-800-FDA-1088. °Where should I keep my medicine? °Keep out of the reach of children. °Store in a refrigerator between 2 and 8 degrees C (36 and 46 degrees F). Protect from light. Allow to come to room temperature naturally. Do not use artificial heat. If protected from light, the injection may be stored at room temperature between 20 and 30 degrees C (70 and 86 degrees F) for 14 days. After the initial use, throw away any unused portion of a multiple dose vial after 14 days. Throw away unused portions of the ampules after use. °NOTE: This sheet is a summary. It may not cover all possible information. If you have questions about this medicine, talk to your doctor, pharmacist, or health care provider. °© 2020 [employer] (2018-04-30 08:07:09) ° °

## 2019-08-25 DIAGNOSIS — M71572 Other bursitis, not elsewhere classified, left ankle and foot: Secondary | ICD-10-CM | POA: Diagnosis not present

## 2019-08-25 DIAGNOSIS — M7662 Achilles tendinitis, left leg: Secondary | ICD-10-CM | POA: Diagnosis not present

## 2019-08-28 DIAGNOSIS — Z20828 Contact with and (suspected) exposure to other viral communicable diseases: Secondary | ICD-10-CM | POA: Diagnosis not present

## 2019-08-28 DIAGNOSIS — Z03818 Encounter for observation for suspected exposure to other biological agents ruled out: Secondary | ICD-10-CM | POA: Diagnosis not present

## 2019-09-01 ENCOUNTER — Other Ambulatory Visit: Payer: Self-pay

## 2019-09-01 ENCOUNTER — Inpatient Hospital Stay: Payer: Medicare Other | Attending: Oncology

## 2019-09-01 VITALS — BP 128/76 | HR 78 | Temp 98.2°F | Resp 18

## 2019-09-01 DIAGNOSIS — C7B8 Other secondary neuroendocrine tumors: Secondary | ICD-10-CM | POA: Diagnosis not present

## 2019-09-01 DIAGNOSIS — C7A8 Other malignant neuroendocrine tumors: Secondary | ICD-10-CM | POA: Diagnosis not present

## 2019-09-01 DIAGNOSIS — D3A8 Other benign neuroendocrine tumors: Secondary | ICD-10-CM

## 2019-09-01 MED ORDER — OCTREOTIDE ACETATE 30 MG IM KIT
PACK | INTRAMUSCULAR | Status: AC
  Filled 2019-09-01: qty 1

## 2019-09-01 MED ORDER — OCTREOTIDE ACETATE 30 MG IM KIT
30.0000 mg | PACK | Freq: Once | INTRAMUSCULAR | Status: AC
  Administered 2019-09-01: 30 mg via INTRAMUSCULAR

## 2019-09-01 NOTE — Patient Instructions (Signed)
Octreotide injection solution °What is this medicine? °OCTREOTIDE (ok TREE oh tide) is used to reduce blood levels of growth hormone in patients with a condition called acromegaly. This medicine also reduces flushing and watery diarrhea caused by certain types of cancer. °This medicine may be used for other purposes; ask your health care provider or pharmacist if you have questions. °COMMON BRAND NAME(S): Bynfezia, Sandostatin, Sandostatin LAR °What should I tell my health care provider before I take this medicine? °They need to know if you have any of these conditions: °· diabetes °· gallbladder disease °· kidney disease °· liver disease °· an unusual or allergic reaction to octreotide, other medicines, foods, dyes, or preservatives °· pregnant or trying to get pregnant °· breast-feeding °How should I use this medicine? °This medicine is for injection under the skin or into a vein (only in emergency situations). It is usually given by a health care professional in a hospital or clinic setting. °If you get this medicine at home, you will be taught how to prepare and give this medicine. Allow the injection solution to come to room temperature before use. Do not warm it artificially. Use exactly as directed. Take your medicine at regular intervals. Do not take your medicine more often than directed. °It is important that you put your used needles and syringes in a special sharps container. Do not put them in a trash can. If you do not have a sharps container, call your pharmacist or healthcare provider to get one. °Talk to your pediatrician regarding the use of this medicine in children. Special care may be needed. °Overdosage: If you think you have taken too much of this medicine contact a poison control center or emergency room at once. °NOTE: This medicine is only for you. Do not share this medicine with others. °What if I miss a dose? °If you miss a dose, take it as soon as you can. If it is almost time for your  next dose, take only that dose. Do not take double or extra doses. °What may interact with this medicine? °Do not take this medicine with any of the following medications: °· cisapride °· droperidol °· general anesthetics °· grepafloxacin °· perphenazine °· thioridazine °This medicine may also interact with the following medications: °· bromocriptine °· cyclosporine °· diuretics °· medicines for blood pressure, heart disease, irregular heart beat °· medicines for diabetes, including insulin °· quinidine °This list may not describe all possible interactions. Give your health care provider a list of all the medicines, herbs, non-prescription drugs, or dietary supplements you use. Also tell them if you smoke, drink alcohol, or use illegal drugs. Some items may interact with your medicine. °What should I watch for while using this medicine? °Visit your doctor or health care professional for regular checks on your progress. °To help reduce irritation at the injection site, use a different site for each injection and make sure the solution is at room temperature before use. °This medicine may cause decreases in blood sugar. Signs of low blood sugar include chills, cool, pale skin or cold sweats, drowsiness, extreme hunger, fast heartbeat, headache, nausea, nervousness or anxiety, shakiness, trembling, unsteadiness, tiredness, or weakness. Contact your doctor or health care professional right away if you experience any of these symptoms. °This medicine may increase blood sugar. Ask your healthcare provider if changes in diet or medicines are needed if you have diabetes. °This medicine may cause a decrease in vitamin B12. You should make sure that you get enough vitamin B12   while you are taking this medicine. Discuss the foods you eat and the vitamins you take with your health care professional. °What side effects may I notice from receiving this medicine? °Side effects that you should report to your doctor or health care  professional as soon as possible: °· allergic reactions like skin rash, itching or hives, swelling of the face, lips, or tongue °· decreases in blood sugar °· changes in heart rate °· severe stomach pain °·  °signs and symptoms of high blood sugar such as being more thirsty or hungry or having to urinate more than normal. You may also feel very tired or have blurry vision. °Side effects that usually do not require medical attention (report to your doctor or health care professional if they continue or are bothersome): °· diarrhea or constipation °· gas or stomach pain °· nausea, vomiting °· pain, redness, swelling and irritation at site where injected °This list may not describe all possible side effects. Call your doctor for medical advice about side effects. You may report side effects to FDA at 1-800-FDA-1088. °Where should I keep my medicine? °Keep out of the reach of children. °Store in a refrigerator between 2 and 8 degrees C (36 and 46 degrees F). Protect from light. Allow to come to room temperature naturally. Do not use artificial heat. If protected from light, the injection may be stored at room temperature between 20 and 30 degrees C (70 and 86 degrees F) for 14 days. After the initial use, throw away any unused portion of a multiple dose vial after 14 days. Throw away unused portions of the ampules after use. °NOTE: This sheet is a summary. It may not cover all possible information. If you have questions about this medicine, talk to your doctor, pharmacist, or health care provider. °© 2020 [employer] (2018-04-30 08:07:09) ° °

## 2019-09-06 DIAGNOSIS — M7662 Achilles tendinitis, left leg: Secondary | ICD-10-CM | POA: Diagnosis not present

## 2019-09-06 DIAGNOSIS — M71572 Other bursitis, not elsewhere classified, left ankle and foot: Secondary | ICD-10-CM | POA: Diagnosis not present

## 2019-09-13 ENCOUNTER — Telehealth: Payer: Self-pay

## 2019-09-13 NOTE — Telephone Encounter (Signed)
New message   The patient calling wants to discuss upcoming appt on  2/17. Asking the nurse to call him back.

## 2019-09-14 NOTE — Telephone Encounter (Signed)
Pt wanted to verify upcoming CPE appointment time

## 2019-09-20 ENCOUNTER — Other Ambulatory Visit: Payer: Self-pay | Admitting: Internal Medicine

## 2019-09-20 NOTE — Telephone Encounter (Signed)
Please refill as per office routine med refill policy (all routine meds refilled for 3 mo or monthly per pt preference up to one year from last visit, then month to month grace period for 3 mo, then further med refills will have to be denied)  

## 2019-09-22 ENCOUNTER — Encounter: Payer: Self-pay | Admitting: Internal Medicine

## 2019-09-22 ENCOUNTER — Ambulatory Visit (INDEPENDENT_AMBULATORY_CARE_PROVIDER_SITE_OTHER): Payer: Medicare Other | Admitting: Internal Medicine

## 2019-09-22 ENCOUNTER — Other Ambulatory Visit: Payer: Self-pay

## 2019-09-22 VITALS — BP 118/66 | Temp 97.4°F | Ht 70.0 in | Wt 189.6 lb

## 2019-09-22 DIAGNOSIS — R739 Hyperglycemia, unspecified: Secondary | ICD-10-CM

## 2019-09-22 DIAGNOSIS — E785 Hyperlipidemia, unspecified: Secondary | ICD-10-CM | POA: Diagnosis not present

## 2019-09-22 DIAGNOSIS — N3281 Overactive bladder: Secondary | ICD-10-CM

## 2019-09-22 DIAGNOSIS — R972 Elevated prostate specific antigen [PSA]: Secondary | ICD-10-CM

## 2019-09-22 DIAGNOSIS — E538 Deficiency of other specified B group vitamins: Secondary | ICD-10-CM | POA: Diagnosis not present

## 2019-09-22 DIAGNOSIS — E611 Iron deficiency: Secondary | ICD-10-CM | POA: Diagnosis not present

## 2019-09-22 DIAGNOSIS — E559 Vitamin D deficiency, unspecified: Secondary | ICD-10-CM | POA: Diagnosis not present

## 2019-09-22 DIAGNOSIS — I10 Essential (primary) hypertension: Secondary | ICD-10-CM

## 2019-09-22 DIAGNOSIS — Z23 Encounter for immunization: Secondary | ICD-10-CM | POA: Diagnosis not present

## 2019-09-22 LAB — CBC WITH DIFFERENTIAL/PLATELET
Basophils Absolute: 0 10*3/uL (ref 0.0–0.1)
Basophils Relative: 0.5 % (ref 0.0–3.0)
Eosinophils Absolute: 0.1 10*3/uL (ref 0.0–0.7)
Eosinophils Relative: 1.9 % (ref 0.0–5.0)
HCT: 47 % (ref 39.0–52.0)
Hemoglobin: 15.9 g/dL (ref 13.0–17.0)
Lymphocytes Relative: 12.7 % (ref 12.0–46.0)
Lymphs Abs: 0.7 10*3/uL (ref 0.7–4.0)
MCHC: 33.7 g/dL (ref 30.0–36.0)
MCV: 97.1 fl (ref 78.0–100.0)
Monocytes Absolute: 0.6 10*3/uL (ref 0.1–1.0)
Monocytes Relative: 11.7 % (ref 3.0–12.0)
Neutro Abs: 4 10*3/uL (ref 1.4–7.7)
Neutrophils Relative %: 73.2 % (ref 43.0–77.0)
Platelets: 157 10*3/uL (ref 150.0–400.0)
RBC: 4.85 Mil/uL (ref 4.22–5.81)
RDW: 12.9 % (ref 11.5–15.5)
WBC: 5.5 10*3/uL (ref 4.0–10.5)

## 2019-09-22 LAB — HEPATIC FUNCTION PANEL
ALT: 27 U/L (ref 0–53)
AST: 29 U/L (ref 0–37)
Albumin: 4.8 g/dL (ref 3.5–5.2)
Alkaline Phosphatase: 79 U/L (ref 39–117)
Bilirubin, Direct: 0.2 mg/dL (ref 0.0–0.3)
Total Bilirubin: 1.2 mg/dL (ref 0.2–1.2)
Total Protein: 7.4 g/dL (ref 6.0–8.3)

## 2019-09-22 LAB — URINALYSIS, ROUTINE W REFLEX MICROSCOPIC
Bilirubin Urine: NEGATIVE
Hgb urine dipstick: NEGATIVE
Ketones, ur: NEGATIVE
Leukocytes,Ua: NEGATIVE
Nitrite: NEGATIVE
RBC / HPF: NONE SEEN (ref 0–?)
Specific Gravity, Urine: 1.02 (ref 1.000–1.030)
Total Protein, Urine: NEGATIVE
Urine Glucose: NEGATIVE
Urobilinogen, UA: 0.2 (ref 0.0–1.0)
WBC, UA: NONE SEEN (ref 0–?)
pH: 7.5 (ref 5.0–8.0)

## 2019-09-22 LAB — LIPID PANEL
Cholesterol: 165 mg/dL (ref 0–200)
HDL: 53.5 mg/dL (ref >39.00)
LDL Cholesterol: 98 mg/dL (ref 0–99)
NonHDL: 111.17
Total CHOL/HDL Ratio: 3
Triglycerides: 68 mg/dL (ref 0.0–149.0)
VLDL: 13.6 mg/dL (ref 0.0–40.0)

## 2019-09-22 LAB — BASIC METABOLIC PANEL
BUN: 23 mg/dL (ref 6–23)
CO2: 38 mEq/L — ABNORMAL HIGH (ref 19–32)
Calcium: 10 mg/dL (ref 8.4–10.5)
Chloride: 96 mEq/L (ref 96–112)
Creatinine, Ser: 1.13 mg/dL (ref 0.40–1.50)
GFR: 64.95 mL/min (ref >60.00)
Glucose, Bld: 100 mg/dL — ABNORMAL HIGH (ref 70–99)
Potassium: 4.8 mEq/L (ref 3.5–5.1)
Sodium: 138 mEq/L (ref 135–145)

## 2019-09-22 LAB — TSH: TSH: 1.33 u[IU]/mL (ref 0.35–4.50)

## 2019-09-22 LAB — IBC PANEL
Iron: 168 ug/dL — ABNORMAL HIGH (ref 42–165)
Saturation Ratios: 40.3 % (ref 20.0–50.0)
Transferrin: 298 mg/dL (ref 212.0–360.0)

## 2019-09-22 LAB — HEMOGLOBIN A1C: Hgb A1c MFr Bld: 9.7 % — ABNORMAL HIGH (ref 4.6–6.5)

## 2019-09-22 LAB — VITAMIN D 25 HYDROXY (VIT D DEFICIENCY, FRACTURES): VITD: 61.47 ng/mL (ref 30.00–100.00)

## 2019-09-22 LAB — PSA: PSA: 1.8 ng/mL (ref 0.10–4.00)

## 2019-09-22 LAB — VITAMIN B12: Vitamin B-12: 490 pg/mL (ref 211–911)

## 2019-09-22 MED ORDER — SOLIFENACIN SUCCINATE 5 MG PO TABS: 5.0000 mg | ORAL_TABLET | Freq: Every day | ORAL | 3 refills | Status: DC

## 2019-09-22 NOTE — Assessment & Plan Note (Signed)
stable overall by history and exam, recent data reviewed with pt, and pt to continue medical treatment as before,  to f/u any worsening symptoms or concerns  

## 2019-09-22 NOTE — Assessment & Plan Note (Signed)
For f/u psa,  to f/u any worsening symptoms or concerns 

## 2019-09-22 NOTE — Assessment & Plan Note (Addendum)
For vesicare 5 qd  I spent 32 minutes in addition to time for wellness examination in preparing to see the patient by review of recent labs, imaging and procedures, obtaining and reviewing separately obtained history, communicating with the patient and family or caregiver, ordering medications, tests or procedures, and documenting clinical information in the EHR including the differential Dx, treatment, and any further evaluation and other management of OAB, psa elevation, HLD, hyperglycemia, HTN

## 2019-09-22 NOTE — Patient Instructions (Signed)
You had the Prevnar 13 pneumonia shot today  Please take all new medication as prescribed - the generic vesicare for the frequent urination  Please continue all other medications as before, and refills have been done if requested.  Please have the pharmacy call with any other refills you may need.  Please continue your efforts at being more active, low cholesterol diet, and weight control.  You are otherwise up to date with prevention measures today.  Please keep your appointments with your specialists as you may have planned  Please go to the LAB at the blood drawing area for the tests to be done  You will be contacted by phone if any changes need to be made immediately.  Otherwise, you will receive a letter about your results with an explanation, but please check with MyChart first.  Please remember to sign up for MyChart if you have not done so, as this will be important to you in the future with finding out test results, communicating by private email, and scheduling acute appointments online when needed.  Please make an Appointment to return for your 1 year visit, or sooner if needed

## 2019-09-22 NOTE — Progress Notes (Signed)
Subjective:    Patient ID: Ronald Thompson, male    DOB: [birth year], 64 y.o.   MRN: [id number]  HPI  Here to f/u; overall doing ok,  Pt denies chest pain, increasing sob or doe, wheezing, orthopnea, PND, increased LE swelling, palpitations, dizziness or syncope.  Pt denies new neurological symptoms such as new headache, or facial or extremity weakness or numbness.  Pt denies polydipsia, polyuria, or low sugar episode.  Pt states overall good compliance with meds, mostly trying to follow appropriate diet, with wt overall stable,  but little exercise however.  Has appt for COVID #1 on Mar 17. Due for Prevnar 13.  Also with ongoing urinary frequency and nocturia several times per night Past Medical History:  Diagnosis Date  . Baker's cyst   . Gallstones   . Heart murmur   . Hypercholesteremia   . Hypertension   . Lesion of left lung    hx.25 yrs ago- testicilar cancer related- only scarring left after tx.-no problems now  . Liver metastasis ([place])   . 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  . Pericarditis    2nd to tumor  . Pneumonia 1990  . Primary neuroendocrine tumor of pancreas 02/14/2014   Stage IV with multiple mets to liver  . Pulmonary fibrosis ([place]) 11/08/2015  . S/P TAVR (transcatheter aortic valve replacement) 04/16/2016   26 mm Edwards Sapien 3 transcatheter heart valve placed via percutaneous right transfemoral approach   . Shortness of breath dyspnea    with exertion and when tired  . 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 Transfusion history    hx. 25 yrs ago-during cancer tx.   Past Surgical History:  Procedure Laterality Date  . BACK SURGERY     '14- rupt. disc   . CARDIAC CATHETERIZATION N/A 01/24/2016   Procedure: Right/Left Heart Cath and Coronary Angiography;  Surgeon: [name], MD;  Location: [facility];  Service: Cardiovascular;  Laterality: N/A;  . COLONOSCOPY    . EUS N/A 02/10/2014   Procedure: UPPER ENDOSCOPIC ULTRASOUND (EUS) LINEAR;  Surgeon: [name], MD;  Location: [facility];  Service: Endoscopy;  Laterality: N/A;  . IR RADIOLOGIST EVAL & MGMT  02/11/2017  . KNEE SURGERY Right    age 28 for Baker's cyst  . NASAL SEPTUM SURGERY     Deviated septrum  . TEE WITHOUT CARDIOVERSION N/A 04/16/2016   Procedure: TRANSESOPHAGEAL ECHOCARDIOGRAM (TEE);  Surgeon: [name], MD;  Location: [facility];  Service: Open Heart Surgery;  Laterality: N/A;  . THORACOTOMY  1990   wedge biopsy of mediastinal mass  . TRANSCATHETER AORTIC VALVE REPLACEMENT, TRANSFEMORAL N/A 04/16/2016   Procedure: TRANSCATHETER AORTIC VALVE REPLACEMENT, TRANSFEMORAL;  Surgeon: [name], MD;  Location: [facility];  Service: Open Heart Surgery;  Laterality: N/A;    reports that he has never smoked. He has never used smokeless tobacco. He reports current alcohol use. He reports that he does not use drugs. family history includes Cancer in his maternal aunt and paternal grandfather; Dementia in his mother; Emphysema in his maternal aunt. Allergies  Allergen Reactions  . Penicillins Hives    ENTIRE BODY Has patient had a PCN reaction causing immediate rash, facial/tongue/throat swelling, SOB or lightheadedness with hypotension: No Has patient had a PCN reaction causing severe rash involving mucus membranes or skin necrosis:  No Has patient had a PCN reaction that required hospitalization * *  YES  * * Has patient had a PCN reaction occurring within the last 10 years: No If all of the above answers are "NO", then may proceed with Cephalosporin use. *reaction occurred when he was 17   Current Outpatient Medications on File Prior to Visit  Medication Sig Dispense Refill  . acetaminophen (TYLENOL) 500 MG tablet Take 1,000 mg by mouth every 6 (six) hours as needed for headache.    . albuterol (PROVENTIL HFA;VENTOLIN  HFA) 108 (90 Base) MCG/ACT inhaler Inhale 1-2 puffs into the lungs every 6 (six) hours as needed for wheezing or shortness of breath. 1 Inhaler 0  . Blood Pressure Monitoring (BLOOD PRESSURE CUFF) MISC Use as directed 1 each 0  . calcium carbonate (TUMS - DOSED IN MG ELEMENTAL CALCIUM) 500 MG chewable tablet Chew 3 tablets by mouth 3 (three) times daily as needed for indigestion or heartburn.     . docusate sodium (COLACE) 100 MG capsule Take 100 mg by mouth daily as needed for mild constipation.    Marland Kitchen ibuprofen (ADVIL,MOTRIN) 200 MG tablet Take 400 mg by mouth every 6 (six) hours as needed for headache or mild pain.     Marland Kitchen losartan-hydrochlorothiazide (HYZAAR) 100-25 MG tablet Take 1 tablet by mouth daily. Overdue for Annual appt must see provider for future refills 30 tablet 0  . sildenafil (VIAGRA) 100 MG tablet TAKE ONE-HALF TO ONE TABLET BY MOUTH DAILY AS NEEDED FOR ERECTILE DYSFUNCTION 5 tablet 10  . simvastatin (ZOCOR) 20 MG tablet TAKE ONE TABLET BY MOUTH DAILY AT 6PM 90 tablet 3  . clindamycin (CLEOCIN) 300 MG capsule Take 2 capsules by mouth one hour prior to dental appointment (Patient not taking: Reported on 06/09/2019) 6 capsule 2  . cyclobenzaprine (FLEXERIL) 5 MG tablet Take 1 tablet (5 mg total) by mouth 3 (three) times daily as needed for muscle spasms. (Patient not taking: Reported on 06/09/2019) 40 tablet 0  . Octreotide Acetate (SANDOSTATIN IJ) Inject 1 each as directed every 30 (thirty) days.     . sennosides-docusate sodium (SENOKOT-S) 8.6-50 MG tablet Take 1 tablet by mouth as needed for constipation.     Current Facility-Administered Medications on File Prior to Visit  Medication Dose Route Frequency Provider Last Rate Last Admin  . heparin lock flush 100 unit/mL  500 Units Intravenous Once Betsy Coder B, MD      . sodium chloride flush (NS) 0.9 % injection 10 mL  10 mL Intracatheter PRN Ladell Pier, MD       Review of Systems All otherwise neg per pt     Objective:     Physical Exam BP 118/66 (BP Location: Left Arm, Patient Position: Sitting, Cuff Size: Normal)   Temp (!) 97.4 F (36.3 C) (Oral)   Ht 5\' 10"  (1.778 m)   Wt 189 lb 9.6 oz (86 kg)   BMI 27.20 kg/m  VS noted,  Constitutional: Pt appears in NAD HENT: Head: NCAT.  Right Ear: External ear normal.  Left Ear: External ear normal.  Eyes: . Pupils are equal, round, and reactive to light. Conjunctivae and EOM are normal Nose: without d/c or deformity Neck: Neck supple. Gross normal ROM Cardiovascular: Normal rate and regular rhythm.   Pulmonary/Chest: Effort normal and breath sounds without rales or wheezing.  Abd:  Soft, NT, ND, + BS, no organomegaly Neurological: Pt is alert. At baseline orientation, motor grossly intact Skin: Skin is warm. No  rashes, other new lesions, no LE edema Psychiatric: Pt behavior is normal without agitation  All otherwise neg per pt Lab Results  Component Value Date   WBC 5.5 09/22/2019   HGB 15.9 09/22/2019   HCT 47.0 09/22/2019   PLT 157.0 09/22/2019   GLUCOSE 100 (H) 09/22/2019   CHOL 165 09/22/2019   TRIG 68.0 09/22/2019   HDL 53.50 09/22/2019   LDLDIRECT 139.2 10/10/2008   LDLCALC 98 09/22/2019   ALT 27 09/22/2019   AST 29 09/22/2019   NA 138 09/22/2019   K 4.8 09/22/2019   CL 96 09/22/2019   CREATININE 1.13 09/22/2019   BUN 23 09/22/2019   CO2 38 (H) 09/22/2019   TSH 1.33 09/22/2019   PSA 1.80 09/22/2019   INR 1.30 04/16/2016   HGBA1C 9.7 (H) 09/22/2019      Assessment & Plan:

## 2019-09-23 ENCOUNTER — Other Ambulatory Visit: Payer: Self-pay | Admitting: Internal Medicine

## 2019-09-23 DIAGNOSIS — E119 Type 2 diabetes mellitus without complications: Secondary | ICD-10-CM

## 2019-09-23 MED ORDER — METFORMIN HCL ER 500 MG PO TB24: 1000.0000 mg | ORAL_TABLET | Freq: Every day | ORAL | 3 refills | Status: DC

## 2019-09-24 ENCOUNTER — Telehealth: Payer: Self-pay | Admitting: Internal Medicine

## 2019-09-24 ENCOUNTER — Encounter (HOSPITAL_COMMUNITY)
Admission: RE | Admit: 2019-09-24 | Discharge: 2019-09-24 | Disposition: A | Discharge status: Home / Self Care | Payer: Medicare Other | Source: Ambulatory Visit | Attending: Oncology | Admitting: Oncology

## 2019-09-24 ENCOUNTER — Other Ambulatory Visit: Payer: Self-pay

## 2019-09-24 DIAGNOSIS — D3A8 Other benign neuroendocrine tumors: Secondary | ICD-10-CM | POA: Insufficient documentation

## 2019-09-24 DIAGNOSIS — C7A1 Malignant poorly differentiated neuroendocrine tumors: Secondary | ICD-10-CM | POA: Diagnosis not present

## 2019-09-24 MED ORDER — GALLIUM GA 68 DOTATATE IV KIT
4.8000 | PACK | Freq: Once | INTRAVENOUS | Status: AC | PRN
  Administered 2019-09-24: 4.8 via INTRAVENOUS

## 2019-09-24 NOTE — Telephone Encounter (Signed)
This was sent in on yesterday.

## 2019-09-24 NOTE — Telephone Encounter (Signed)
    Pt c/o medication issue:  1. Name of Medication: metFORMIN (GLUCOPHAGE-XR) 500 MG 24 hr tablet  2. How are you currently taking this medication (dosage and times per day)? N/A  3. Are you having a reaction (difficulty breathing--STAT)? NO  4. What is your medication issue? Patient wants an explanation why he has been prescribed Metfornin

## 2019-09-29 ENCOUNTER — Inpatient Hospital Stay: Payer: Medicare Other

## 2019-09-29 ENCOUNTER — Other Ambulatory Visit: Payer: Self-pay

## 2019-09-29 ENCOUNTER — Inpatient Hospital Stay: Payer: Medicare Other | Attending: Oncology | Admitting: Oncology

## 2019-09-29 VITALS — BP 133/79 | HR 88 | Temp 98.0°F | Resp 18 | Ht 70.0 in | Wt 184.4 lb

## 2019-09-29 DIAGNOSIS — D3A8 Other benign neuroendocrine tumors: Secondary | ICD-10-CM

## 2019-09-29 DIAGNOSIS — C7B8 Other secondary neuroendocrine tumors: Secondary | ICD-10-CM | POA: Insufficient documentation

## 2019-09-29 DIAGNOSIS — C7A8 Other malignant neuroendocrine tumors: Secondary | ICD-10-CM | POA: Diagnosis not present

## 2019-09-29 LAB — CMP (CANCER CENTER ONLY)
ALT: 24 U/L (ref 0–44)
AST: 29 U/L (ref 15–41)
Albumin: 4.5 g/dL (ref 3.5–5.0)
Alkaline Phosphatase: 77 U/L (ref 38–126)
Anion gap: 11 (ref 5–15)
BUN: 27 mg/dL — ABNORMAL HIGH (ref 8–23)
CO2: 31 mmol/L (ref 22–32)
Calcium: 9.7 mg/dL (ref 8.9–10.3)
Chloride: 100 mmol/L (ref 98–111)
Creatinine: 1.2 mg/dL (ref 0.61–1.24)
GFR, Est AFR Am: >60 mL/min (ref >60)
GFR, Estimated: >60 mL/min (ref >60)
Glucose, Bld: 81 mg/dL (ref 70–99)
Potassium: 4.3 mmol/L (ref 3.5–5.1)
Sodium: 142 mmol/L (ref 135–145)
Total Bilirubin: 1.2 mg/dL (ref 0.3–1.2)
Total Protein: 7.4 g/dL (ref 6.5–8.1)

## 2019-09-29 MED ORDER — OCTREOTIDE ACETATE 30 MG IM KIT
PACK | INTRAMUSCULAR | Status: AC
  Filled 2019-09-29: qty 1

## 2019-09-29 MED ORDER — OCTREOTIDE ACETATE 30 MG IM KIT
30.0000 mg | PACK | Freq: Once | INTRAMUSCULAR | Status: AC
  Administered 2019-09-29: 30 mg via INTRAMUSCULAR

## 2019-09-29 NOTE — Progress Notes (Signed)
[facility] OFFICE PROGRESS NOTE   Diagnosis: Pancreas neuroendocrine tumor  INTERVAL HISTORY:    [name] returns as scheduled.  He continues monthly Sandostatin.  He feels well.  Good appetite and energy level.  No new complaint.  He was recently diagnosed with diabetes.  He is very concerned about this.  He has been referred to an endocrinologist. He is scheduled for the COVID-19 vaccine. Object returns for a scheduled visit.  He feels well.ive:  Vital signs in last 24 hours:  Blood pressure 133/79, pulse 88, temperature 98 F (36.7 C), temperature source Temporal, resp. rate 18, height 5\' 10"  (1.778 m), weight 184 lb 6.4 oz (83.6 kg), SpO2 95 %.    Limited physical examination secondary to distancing with the Covid pandemic GI: No hepatosplenomegaly, no mass, nontender Vascular: No leg edema   Lab Results:  Lab Results  Component Value Date   WBC 5.5 09/22/2019   HGB 15.9 09/22/2019   HCT 47.0 09/22/2019   MCV 97.1 09/22/2019   PLT 157.0 09/22/2019   NEUTROABS 4.0 09/22/2019    CMP  Lab Results  Component Value Date   NA 142 09/29/2019   K 4.3 09/29/2019   CL 100 09/29/2019   CO2 31 09/29/2019   GLUCOSE 81 09/29/2019   BUN 27 (H) 09/29/2019   CREATININE 1.20 09/29/2019   CALCIUM 9.7 09/29/2019   PROT 7.4 09/29/2019   ALBUMIN 4.5 09/29/2019   AST 29 09/29/2019   ALT 24 09/29/2019   ALKPHOS 77 09/29/2019   BILITOT 1.2 09/29/2019   GFRNONAA >60 09/29/2019   GFRAA >60 09/29/2019   Netspot images from 09/24/2019 reviewed with Mr. Erskin   Imaging:   Medications: I have reviewed the patient's current medications.   Assessment/Plan: 1. Pancreatic neuroendocrine tumor, WHO grade 2, pancreatic head mass and small peripancreatic/celiac nodes on a CT 02/04/2014  EUS revealed evidence of multiple liver metastases-status post an FNA biopsy of a left liver lesion 02/10/2014 confirming a neuroendocrine tumor   Octreotide scan 04/01/2014 with  multiple foci of metastatic neuroendocrine tumor in the liver  Monthly Sandostatin started 03/16/2014  Restaging CT 07/04/2014 with resolution of a previously noted pancreas head lesion and probable progression of liver metastases  Restaging CT 10/31/2014-stable liver lesions, no evidence of a pancreas mass, stable.  Restaging CT 02/28/2015-stable hepatic metastases, stable pancreas lesion unchanged  Restaging CT 08/30/2015-stable pancreas mass, slight enlargement of hepatic metastases  Monthly Sandostatin continued  Restaging CTs06/14/2017 revealed enlargement of liver lesions, no new lesions, enlargement of the pancreas head mass  Gallium DOTATATEscan 10/01/2016 confirmed uptake in the liver metastases, pancreas primary, peripancreatic adenopathy, and left iliac bone  Cycle 1 Lutathera 03/05/2017  Cycle 2Lutathera09/26/2018  Cycle 3 Lutathera 07/02/2017  Cycle 4 Lutathera 08/27/2017  Netspotscan 09/29/2017-decreased radiotracer activity within the pancreas head, peripancreatic lymph node, and solitary skeletal lesion. Liver lesions have increased in size with a decrease in radiotracer activity  Monthly Sandostatin continued  Netspot scan 05/20/2018-stable to decreased size of liver lesions, most have decreased SUV, similar uptake in the pancreas lesion, previously noted peripancreatic lymph node has resolved him a mild decrease in uptake associated with a left iliac metastasis, no evidence of disease progression  Monthly Sandostatin continued  Netspot scan 01/29/2019- overall stable to improved, 1 lesion left hepatic lobe has increased tracer activity-unchanged in size, majority of hepatic lesions have reduced activity compared to the pretreatment scan, decreased activity left iliac bone lesion, stable activity in the head of the pancreas lesion,  no new lesions  Monthly Sandostatin continued  Netspot on September 24, 2019-increase in size of 3 liver lesions with associated  stable radiotracer activity, no new lesions, stable small left iliac lesion  2. Chest Seminoma in 1990 treated with BEP and chest radiation at [facility] 3. 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 chronic exertional dyspnea following treatment for the seminoma  5. aortic stenosis on an echocardiogram December 2011, severe aortic stenosis on echocardiogram 12/06/2015, status postTAVR on 04/16/2016 6. lumbar disc surgery 2014  7. acute abdominal pain 02/04/2014-resolved      Disposition: Mr. Tidrick appears unchanged.  The restaging Netspot reveals an increase in the size of several liver lesions.  No new finding.  We discussed treatment options.  He is greater than 2 years out from completing  [place].  He was treated with Lutathera beginning in August 2018.  There is evidence of benefit with repeat Lutathera treatment.  Mr. Stimac would like to proceed with additional Lutathera.  He will receive Sandostatin today.  We will contact Dr. Leonia Reeves to schedule Lutathera.  He will be scheduled for an office visit and Sandostatin here in 8 weeks.  He will see an endocrinologist for evaluation and management of diabetes.  Betsy Coder, MD  09/29/2019  1:54 PM

## 2019-09-29 NOTE — Patient Instructions (Signed)
Octreotide injection solution °What is this medicine? °OCTREOTIDE (ok TREE oh tide) is used to reduce blood levels of growth hormone in patients with a condition called acromegaly. This medicine also reduces flushing and watery diarrhea caused by certain types of cancer. °This medicine may be used for other purposes; ask your health care provider or pharmacist if you have questions. °COMMON BRAND NAME(S): Bynfezia, Sandostatin, Sandostatin LAR °What should I tell my health care provider before I take this medicine? °They need to know if you have any of these conditions: °· diabetes °· gallbladder disease °· kidney disease °· liver disease °· an unusual or allergic reaction to octreotide, other medicines, foods, dyes, or preservatives °· pregnant or trying to get pregnant °· breast-feeding °How should I use this medicine? °This medicine is for injection under the skin or into a vein (only in emergency situations). It is usually given by a health care professional in a hospital or clinic setting. °If you get this medicine at home, you will be taught how to prepare and give this medicine. Allow the injection solution to come to room temperature before use. Do not warm it artificially. Use exactly as directed. Take your medicine at regular intervals. Do not take your medicine more often than directed. °It is important that you put your used needles and syringes in a special sharps container. Do not put them in a trash can. If you do not have a sharps container, call your pharmacist or healthcare provider to get one. °Talk to your pediatrician regarding the use of this medicine in children. Special care may be needed. °Overdosage: If you think you have taken too much of this medicine contact a poison control center or emergency room at once. °NOTE: This medicine is only for you. Do not share this medicine with others. °What if I miss a dose? °If you miss a dose, take it as soon as you can. If it is almost time for your  next dose, take only that dose. Do not take double or extra doses. °What may interact with this medicine? °Do not take this medicine with any of the following medications: °· cisapride °· droperidol °· general anesthetics °· grepafloxacin °· perphenazine °· thioridazine °This medicine may also interact with the following medications: °· bromocriptine °· cyclosporine °· diuretics °· medicines for blood pressure, heart disease, irregular heart beat °· medicines for diabetes, including insulin °· quinidine °This list may not describe all possible interactions. Give your health care provider a list of all the medicines, herbs, non-prescription drugs, or dietary supplements you use. Also tell them if you smoke, drink alcohol, or use illegal drugs. Some items may interact with your medicine. °What should I watch for while using this medicine? °Visit your doctor or health care professional for regular checks on your progress. °To help reduce irritation at the injection site, use a different site for each injection and make sure the solution is at room temperature before use. °This medicine may cause decreases in blood sugar. Signs of low blood sugar include chills, cool, pale skin or cold sweats, drowsiness, extreme hunger, fast heartbeat, headache, nausea, nervousness or anxiety, shakiness, trembling, unsteadiness, tiredness, or weakness. Contact your doctor or health care professional right away if you experience any of these symptoms. °This medicine may increase blood sugar. Ask your healthcare provider if changes in diet or medicines are needed if you have diabetes. °This medicine may cause a decrease in vitamin B12. You should make sure that you get enough vitamin B12   while you are taking this medicine. Discuss the foods you eat and the vitamins you take with your health care professional. °What side effects may I notice from receiving this medicine? °Side effects that you should report to your doctor or health care  professional as soon as possible: °· allergic reactions like skin rash, itching or hives, swelling of the face, lips, or tongue °· decreases in blood sugar °· changes in heart rate °· severe stomach pain °·  °signs and symptoms of high blood sugar such as being more thirsty or hungry or having to urinate more than normal. You may also feel very tired or have blurry vision. °Side effects that usually do not require medical attention (report to your doctor or health care professional if they continue or are bothersome): °· diarrhea or constipation °· gas or stomach pain °· nausea, vomiting °· pain, redness, swelling and irritation at site where injected °This list may not describe all possible side effects. Call your doctor for medical advice about side effects. You may report side effects to FDA at 1-800-FDA-1088. °Where should I keep my medicine? °Keep out of the reach of children. °Store in a refrigerator between 2 and 8 degrees C (36 and 46 degrees F). Protect from light. Allow to come to room temperature naturally. Do not use artificial heat. If protected from light, the injection may be stored at room temperature between 20 and 30 degrees C (70 and 86 degrees F) for 14 days. After the initial use, throw away any unused portion of a multiple dose vial after 14 days. Throw away unused portions of the ampules after use. °NOTE: This sheet is a summary. It may not cover all possible information. If you have questions about this medicine, talk to your doctor, pharmacist, or health care provider. °© 2020 [employer] (2018-04-30 08:07:09) ° °

## 2019-09-30 ENCOUNTER — Other Ambulatory Visit (HOSPITAL_COMMUNITY): Payer: Self-pay | Admitting: Oncology

## 2019-09-30 ENCOUNTER — Other Ambulatory Visit: Payer: Self-pay | Admitting: Oncology

## 2019-09-30 DIAGNOSIS — C7A8 Other malignant neuroendocrine tumors: Secondary | ICD-10-CM

## 2019-10-02 ENCOUNTER — Other Ambulatory Visit: Payer: Self-pay | Admitting: Oncology

## 2019-10-02 DIAGNOSIS — D3A8 Other benign neuroendocrine tumors: Secondary | ICD-10-CM

## 2019-10-06 ENCOUNTER — Encounter (HOSPITAL_COMMUNITY)
Admission: RE | Admit: 2019-10-06 | Discharge: 2019-10-06 | Disposition: A | Discharge status: Home / Self Care | Payer: Medicare Other | Source: Ambulatory Visit | Attending: Oncology | Admitting: Oncology

## 2019-10-06 ENCOUNTER — Other Ambulatory Visit: Payer: Self-pay

## 2019-10-06 DIAGNOSIS — C7A8 Other malignant neuroendocrine tumors: Secondary | ICD-10-CM | POA: Insufficient documentation

## 2019-10-06 DIAGNOSIS — C7A1 Malignant poorly differentiated neuroendocrine tumors: Secondary | ICD-10-CM | POA: Diagnosis not present

## 2019-10-07 ENCOUNTER — Other Ambulatory Visit: Payer: Self-pay

## 2019-10-07 NOTE — Consult Note (Signed)
Chief Complaint: Patient with metastatic neuroendocrine tumor  evaluation Peptide receptor radiotherapy (PRRT) RETREATMENT  with Lu177 DOTATATE (Lutathera).   Referring Physician(s):Sherrill    Patient Status: [facility] - Out-pt  History of Present Illness: 1. Ronald Thompson is a 64 y.o. male Patient presents for retreatment of well differentiated neuroendocrine tumor progression.  Initial lesion in pancreas with liver metastasis and bone metastasis.  Patient achieved greater than 18 months of progression free survival following initial peptide receptor radiotherapy and therefore is a good candidate for re-treatment therapy.  Initial treatment from 02/17/17 to 08/27/17.  Patient now presents with evidence of progression within enlarged liver lesions lesion DOTATATE avid on PET scan 09/24/2019.    Past Medical History:  Diagnosis Date  . Baker's cyst   . Gallstones   . Heart murmur   . Hypercholesteremia   . Hypertension   . Lesion of left lung    hx.25 yrs ago- testicilar cancer related- only scarring left after tx.-no problems now  . Liver metastasis ([place])   . 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  . Pericarditis    2nd to tumor  . Pneumonia 1990  . Primary neuroendocrine tumor of pancreas 02/14/2014   Stage IV with multiple mets to liver  . Pulmonary fibrosis ([place]) 11/08/2015  . S/P TAVR (transcatheter aortic valve replacement) 04/16/2016   26 mm Edwards Sapien 3 transcatheter heart valve placed via percutaneous right transfemoral approach   . Shortness of breath dyspnea    with exertion and when tired  . 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 Transfusion history    hx. 25 yrs ago-during cancer tx.    Past Surgical History:  Procedure Laterality Date  . BACK SURGERY     '14- rupt. disc   . CARDIAC CATHETERIZATION N/A  01/24/2016   Procedure: Right/Left Heart Cath and Coronary Angiography;  Surgeon: [name], MD;  Location: [facility];  Service: Cardiovascular;  Laterality: N/A;  . COLONOSCOPY    . EUS N/A 02/10/2014   Procedure: UPPER ENDOSCOPIC ULTRASOUND (EUS) LINEAR;  Surgeon: [name], MD;  Location: [facility];  Service: Endoscopy;  Laterality: N/A;  . IR RADIOLOGIST EVAL & MGMT  02/11/2017  . KNEE SURGERY Right    age 59 for Baker's cyst  . NASAL SEPTUM SURGERY     Deviated septrum  . TEE WITHOUT CARDIOVERSION N/A 04/16/2016   Procedure: TRANSESOPHAGEAL ECHOCARDIOGRAM (TEE);  Surgeon: [name], MD;  Location: [facility];  Service: Open Heart Surgery;  Laterality: N/A;  . THORACOTOMY  1990   wedge biopsy of mediastinal mass  . TRANSCATHETER AORTIC VALVE REPLACEMENT, TRANSFEMORAL N/A 04/16/2016   Procedure: TRANSCATHETER AORTIC VALVE REPLACEMENT, TRANSFEMORAL;  Surgeon: [name], MD;  Location: [facility];  Service: Open Heart Surgery;  Laterality: N/A;    Allergies: Penicillins  Medications: Prior to Admission medications   Medication Sig Start Date End Date Taking? Authorizing Provider  acetaminophen (TYLENOL) 500 MG tablet Take 1,000 mg by mouth every 6 (six) hours as needed for headache.    [provider]  albuterol (PROVENTIL HFA;VENTOLIN HFA) 108 (90 Base) MCG/ACT inhaler Inhale 1-2 puffs into the lungs every 6 (six) hours as needed for wheezing or shortness of breath. 03/24/17   Nche, Charlene Brooke, NP  Blood Pressure Monitoring (BLOOD PRESSURE CUFF) MISC Use as directed 05/25/18  Marrian Salvage, FNP  calcium carbonate (TUMS - DOSED IN MG ELEMENTAL CALCIUM) 500 MG chewable tablet Chew 3 tablets by mouth 3 (three) times daily as needed for indigestion or heartburn.     [provider]  clindamycin (CLEOCIN) 300 MG capsule Take 2 capsules by mouth one hour prior to dental appointment Patient not taking: Reported on 06/09/2019 05/13/16   [name], MD  cyclobenzaprine (FLEXERIL) 5 MG tablet Take 1 tablet (5 mg total) by mouth 3 (three) times daily as needed for muscle spasms. Patient not taking: Reported on 06/09/2019 03/29/19   [name], MD  docusate sodium (COLACE) 100 MG capsule Take 100 mg by mouth daily as needed for mild constipation.    [provider]  ibuprofen (ADVIL,MOTRIN) 200 MG tablet Take 400 mg by mouth every 6 (six) hours as needed for headache or mild pain.     [provider]  losartan-hydrochlorothiazide (HYZAAR) 100-25 MG tablet Take 1 tablet by mouth daily. Overdue for Annual appt must see provider for future refills 09/21/19   [name], MD  metFORMIN (GLUCOPHAGE-XR) 500 MG 24 hr tablet Take 2 tablets (1,000 mg total) by mouth daily with breakfast. 09/23/19   [name], MD  Octreotide Acetate (SANDOSTATIN IJ) Inject 1 each as directed every 30 (thirty) days.     [provider]  sildenafil (VIAGRA) 100 MG tablet TAKE ONE-HALF TO ONE TABLET BY MOUTH DAILY AS NEEDED FOR ERECTILE DYSFUNCTION 02/22/19   [name], MD  simvastatin (ZOCOR) 20 MG tablet TAKE ONE TABLET BY MOUTH DAILY AT 6PM 02/16/19   [name], MD  solifenacin (VESICARE) 5 MG tablet Take 1 tablet (5 mg total) by mouth daily. Patient not taking: Reported on 09/29/2019 09/22/19   [name], MD     Family History  Problem Relation Age of Onset  . Dementia Mother   . Cancer Paternal Grandfather        esophagus  . Cancer Maternal Aunt        colon  . Emphysema Maternal Aunt     Social History   Socioeconomic History  . Marital status: Married    Spouse name: Not on file  . Number of children: 3  . Years of education: 89  . Highest education level: Not on file  Occupational History  . Occupation: Administrator, sports: [employer]  Tobacco Use  . Smoking status: Never Smoker  . Smokeless tobacco: Never Used  Substance and Sexual Activity  . Alcohol use: Yes    Comment: rare- social  . Drug  use: No  . Sexual activity: Yes    Partners: Female  Other Topics Concern  . Not on file  Social History Narrative   [name]. 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 2 sons: 1979, 1985- married with 2 daughters 1 in route: Oldest in [place], younger [place], [place]. 1 daughter: 7 [place], married-1 granddaughter.Customer service manager- Nurse, mental health. SO- good health; marriage in good health   Enjoys watching movies, works out at home gym   Social Determinants of Radio broadcast assistant Strain:   . Difficulty of Paying Living Expenses: Not on file  Food Insecurity:   . Worried About Charity fundraiser in the Last Year: Not on file  . Ran Out of Food in the Last Year: Not on file  Transportation Needs:   . Lack of Transportation (Medical): Not on file  . Lack of Transportation (Non-Medical): Not on file  Physical Activity:   . Days of Exercise per Week: Not on file  . Minutes of Exercise per Session: Not on file  Stress:   . Feeling of Stress : Not on file  Social Connections:   . Frequency of Communication with Friends and Family: Not on file  . Frequency of Social Gatherings with Friends and Family: Not on file  . Attends Religious Services: Not on file  . Active Member of Clubs or Organizations: Not on file  . Attends Archivist Meetings: Not on file  . Marital Status: Not on file    ECOG Status: 0 - Asymptomatic  Review of Systems: A 12 point ROS discussed and pertinent positives are indicated in the HPI above.  All other systems are negative.  Review of Systems  Constitutional: Negative.        Regular exercise   HENT: Negative.   Respiratory: Negative.   Cardiovascular: Negative.   Psychiatric/Behavioral: Negative.     Vital Signs: There were no vitals taken for this visit.  Physical Exam  - deffered - phone interview Imaging: NM PET (NETSPOT GA 38 DOTATATE) SKULL BASE TO MID THIGH  Result Date: 09/24/2019 CLINICAL DATA:  Well differentiated neuroendocrine tumor. Patient  status post peptide receptor radiotherapy. Patient status peptide receptor radiotherapy (4 cycles of Lu 177 - DOTATATE (Lutathera) . Last Lutathera therapy 08/27/2017. EXAM: NUCLEAR MEDICINE PET SKULL BASE TO THIGH TECHNIQUE: 4.8 mCi Ga 41 DOTATATE was injected intravenously. Full-ring PET imaging was performed from the skull base to thigh after the radiotracer. CT data was obtained and used for attenuation correction and anatomic localization. COMPARISON:  PET-CT 01/29/2019, 05/20/2018, 03/24/2017 FINDINGS: NECK No radiotracer activity in neck lymph nodes. Incidental CT findings: None CHEST No radiotracer accumulation within mediastinal or hilar lymph nodes. No suspicious pulmonary nodules on the CT scan. Incidental CT finding:None ABDOMEN/PELVIS Again demonstrated lesions within liver which accumulate have neuroendocrine tumor specific radiotracer. Dominant lesion in the lateral LEFT hepatic lobe has increased in size significantly measuring 7.3 x 8.1 cm compared to 4.9 x 5.1 cm on PET-CT 01/29/2019. Lesion has peripheral radiotracer activity with SUV max equal 22 compared to central radiotracer activity on comparison exam with SUV max equal 25. Enlarged lesion along the falciform ligament (image 123) measures 3.1 cm compared to 1 point 2 cm (image 122/series 4). This lesion also has intense radiotracer activity. Less well-defined lesion in the RIGHT hepatic lobe measures 4.4 x 4.4 cm (image 106/4) compared to 2.4 by 1.6 cm. Lesion has radiotracer activity SUV max equal 10.4 compared SUV max equal 10.4. Photopenia in lesion in the more lateral RIGHT hepatic lobe consistent with an active treated metastasis. No abnormal radiotracer within abdominopelvic lymph nodes. No abnormal radiotracer activity associated with the bowel. No evidence of bowel obstruction Physiologic activity noted in the liver, spleen, adrenal glands and kidneys. Incidental CT findings: SKELETON Mild activity associated with the LEFT iliac wing  iliac bone along the SI joint with SUV max equal 3.6 compared SUV max equal 3.8. No apparent change in size of this small sclerotic lesion (image 178/4No focal activity to suggest skeletal metastasis. Incidental CT findings:None IMPRESSION: 1. Clear interval increase in size of three lesions within the liver which have associated neuroendocrine tumor specific radiotracer activity. Radiotracer activity within these neuro endocrine tumors remain stable compared to most recent DOTATATE PET scan and remains relatively intense 2. No evidence of new lesions in the chest, abdomen, or pelvis. No mesenteric or bowel lesions. 3. Stable small  lesion in the LEFT ilium with radiotracer activity. 4. With an initial positive response to peptide receptor therapy and subsequent recurrence, patient may be a good candidate for retreatment with LU 177 DOTATATE (2 years since prior therapy.) Consider medicine consultation. Electronically Signed   By: Suzy Bouchard M.D.   On: 09/24/2019 14:35   NM PET (NETSPOT GA 50 DOTATATE) SKULL BASE TO MID THIGH  Result Date: 01/29/2019 CLINICAL DATA:  64 year old male with metastatic well-differentiated neuroendocrine tumor with liver metastasis in pancreatic primary. Patient status peptide receptor radiotherapy (4 cycles of Lu 177 - DOTATATE (Lutathera) . Last Lutathera therapy 08/27/2017. EXAM: NUCLEAR MEDICINE PET SKULL BASE TO THIGH TECHNIQUE: 5.0 mCi Ga 36 DOTATATE was injected intravenously. Full-ring PET imaging was performed from the skull base to thigh after the radiotracer. CT data was obtained and used for attenuation correction and anatomic localization. COMPARISON:  DOTATATE PET scan 05/20/2018, 09/29/2017,12/29/2016 FINDINGS: NECK No radiotracer activity in neck lymph nodes. Incidental CT findings: None CHEST No radiotracer accumulation within mediastinal or hilar lymph nodes. No suspicious pulmonary nodules on the CT scan. Incidental CT finding:None ABDOMEN/PELVIS Again  demonstrated multiple round relatively low-density lesions liver, many of which do not have radiotracer activity. Only 1 lesion in the LEFT hepatic lobe measures increased in activity. Index lesion LEFT lateral hepatic lobe which is more solid than most of the lesions measures 5.0 x 4.7 cm compared to 5.2 x 5.0 cm. Lesion is increased in radiotracer activity SUV max equal 25.3 compared to 9.9. Index lesion in the inferior RIGHT hepatic lobe measuring 4.1 x 3.8 cm compares to 4.3 x 5.0 cm with SUV max equal 7.0 not changed from 6.6. This lesion is marked reduction activity from pretreatment exam. Lesion dome liver SUV max equal 6.5 compared SUV max equal 10.7. Lesion in the head of the pancreas difficult to measure on CT portion and has mild-to-moderate radiotracer activity SUV max equal 11.0 similar 12.3 on prior. Again reduced activity from pretreatment exam. No new abdominopelvic or mesenteric or nodal disease. Physiologic activity noted in the liver, spleen, adrenal glands and kidneys. Incidental CT findings:None SKELETON Single focus of skeletal uptake in the LEFT iliac bone SUV max equal 3.8 decreased from 5.3. Incidental CT findings:None IMPRESSION: 1. Overall stable to improved exam compared to PET-CT 05/20/2018 and 09/29/2017. 2. One lesion in the LEFT hepatic lobe is increased radiotracer activity but unchanged in size. Recommend attention on follow-up. Majority of hepatic lesions remain markedly reduced in radiotracer activity from pretreatment scans. 3. Persistent mild activity in the head of the pancreas lesion. 4. Continued reduction in activity in the LEFT bone iliac lesion. 5. No new lesions identified. Electronically Signed   By: Suzy Bouchard M.D.   On: 01/29/2019 17:10    Labs:  CBC: Recent Labs    05/12/19 1024 09/22/19 1149  WBC 3.7* 5.5  HGB 14.7 15.9  HCT 43.1 47.0  PLT 136* 157.0    COAGS: No results for input(s): INR, APTT in the last 8760 hours.  BMP: Recent Labs     05/12/19 1024 09/22/19 1149 09/29/19 1303  NA 138 138 142  K 4.7 4.8 4.3  CL 102 96 100  CO2 30 38* 31  GLUCOSE 102* 100* 81  BUN 21 23 27*  CALCIUM 9.2 10.0 9.7  CREATININE 1.11 1.13 1.20  GFRNONAA >60  --  >60  GFRAA >60  --  >60    LIVER FUNCTION TESTS: Recent Labs    05/12/19 1024 09/22/19 1149 09/29/19  1303  BILITOT 1.0 1.2 1.2  AST 28 29 29   ALT 25 27 24   ALKPHOS 59 79 77  PROT 6.8 7.4 7.4  ALBUMIN 4.1 4.8 4.5    TUMOR MARKERS: No results for input(s): AFPTM, CEA, CA199, CHROMOGRNA in the last 8760 hours.   Phone interview: Physical exam defered   Assessment and Plan:  1. Patient presents for retreatment of well differentiated neuroendocrine tumor progression.  Initial lesion in pancreas with liver metastasis and bone metastasis.  Patient achieved greater than 18 months of progression free survival following initial peptide receptor radiotherapy and therefore is a good candidate for re-treatment therapy.  Initial treatment from 02/17/17 to 08/27/17.  Patient now presents with evidence of progression within enlarged liver lesions lesion DOTATATE avid on PET scan 09/24/2019.  2. Patient's laboratory values reviewed and he remains a good candidate for peptide receptor radiotherapy with normal CBC and CMP.  Patient received 2 re-treatment doses of the Lu  177 Dotatate space 2 months apart. First re-treatment schedule 10/27/2019  3. Risk and benefits of peptide receptor therapy were reviewed with major risks including myelosuppression. Major benefit is improvement overall survival.  All questions were answered.  4. Patient remains on Sandostatin monthly therapy and will continue during re-treatment.    Thank you for this interesting consult.  I greatly enjoyed meeting Ronald Thompson and look forward to participating in their care.  A copy of this report was sent to the requesting provider on this date.  Electronically Signed: Rennis Golden, MD 10/07/2019, 11:23 AM   I  spent a total of    15 Minutes in phone clinical consultation, greater than 50% of which was counseling/coordinating care for metastatic neuroendocrine tumor.

## 2019-10-11 ENCOUNTER — Other Ambulatory Visit: Payer: Self-pay

## 2019-10-11 ENCOUNTER — Encounter: Payer: Self-pay | Admitting: Internal Medicine

## 2019-10-11 ENCOUNTER — Ambulatory Visit (INDEPENDENT_AMBULATORY_CARE_PROVIDER_SITE_OTHER): Payer: Medicare Other | Admitting: Internal Medicine

## 2019-10-11 VITALS — BP 118/72 | HR 82 | Temp 97.8°F | Ht 70.0 in | Wt 187.0 lb

## 2019-10-11 DIAGNOSIS — E1165 Type 2 diabetes mellitus with hyperglycemia: Secondary | ICD-10-CM | POA: Diagnosis not present

## 2019-10-11 DIAGNOSIS — R739 Hyperglycemia, unspecified: Secondary | ICD-10-CM | POA: Diagnosis not present

## 2019-10-11 LAB — CHROMOGRANIN A: Chromogranin A (ng/mL): 2286 ng/mL — ABNORMAL HIGH (ref 0.0–101.8)

## 2019-10-11 LAB — GLUCOSE, POCT (MANUAL RESULT ENTRY): POC Glucose: 149 mg/dl — AB (ref 70–99)

## 2019-10-11 MED ORDER — ONETOUCH VERIO VI STRP: ORAL_STRIP | 12 refills | Status: DC

## 2019-10-11 MED ORDER — ONETOUCH DELICA LANCETS 33G MISC: 6 refills | Status: DC

## 2019-10-11 NOTE — Patient Instructions (Addendum)
-   Continue Metformin 500 mg XR, TWO tablet with Breakfast    Choose healthy, lower carb lower calorie snacks: toss salad, cooked vegetables, cottage cheese, peanut butter, low fat cheese / string cheese, lower sodium deli meat, tuna salad or chicken salad

## 2019-10-11 NOTE — Progress Notes (Signed)
Name: Ronald Thompson  MRN/ DOB: [id number], 01/29/1956   Age/ Sex: 64 y.o., male    PCP: [name], MD   Reason for Endocrinology Evaluation: Type 2 Diabetes Mellitus     Date of Initial Endocrinology Visit: 10/11/2019     PATIENT IDENTIFIER: [name] is a 64 y.o. male with a past medical history of HTN and pancreatic neuroendocrine tumor 02/05/2014) . The patient presented for initial endocrinology clinic visit on 10/11/2019 for consultative assistance with his diabetes management.    HPI: [name] was    Diagnosed with DM in 09/2019.  Patient had prediabetes since 2017 with an A1c of 5.9%, his A1c remained stable up until 09/2019 at 9.7% Was diagnosed with neuroendocrine tumor in 2015, he is status post Lutathera , and has been on octreotide treatments since 2015. Prior Medications tried/Intolerance: Just metformin  Currently checking blood sugars 0 x / day Hypoglycemia episodes : no Patient required assistance for hypoglycemia: no  Patient has required hospitalization within the last 1 year from hyper or hypoglycemia:   In terms of diet, the patient eats 3 meals a day, has not been snacking as much. He stopped sugar- sweetened beverages ~ 3 weeks ago.    HOME DIABETES REGIMEN: Metformion 500 mg XR 2 daily     Statin: yes ACE-I/ARB: yes Prior Diabetic Education: no    METER DOWNLOAD SUMMARY: Did not bring  DIABETIC COMPLICATIONS: Microvascular complications:    Denies: CKD, retinopathy, neuropathy  Last eye exam: Completed 2020  Macrovascular complications:   S/P Aortic valve replacement.   Denies: CAD, PVD, CVA   PAST HISTORY: Past Medical History:  Past Medical History:  Diagnosis Date  . Baker's cyst   . Gallstones   . Heart murmur   . Hypercholesteremia   . Hypertension   . Lesion of left lung    hx.25 yrs ago- testicilar cancer related- only scarring left after tx.-no problems now  . Liver metastasis ([place])   . Occlusion of left subclavian  vein ([facility]) 1990   and Brachiocephalic- DVT   during chemo left subclavian are larger than right.  . Pericarditis    2nd to tumor  . Pneumonia 1990  . Primary neuroendocrine tumor of pancreas 02/14/2014   Stage IV with multiple mets to liver  . Pulmonary fibrosis ([place]) 11/08/2015  . S/P TAVR (transcatheter aortic valve replacement) 04/16/2016   26 mm Edwards Sapien 3 transcatheter heart valve placed via percutaneous right transfemoral approach   . Shortness of breath dyspnea    with exertion and when tired  . 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 Transfusion history    hx. 25 yrs ago-during cancer tx.   Past Surgical History:  Past Surgical History:  Procedure Laterality Date  . BACK SURGERY     '14- rupt. disc   . CARDIAC CATHETERIZATION N/A 01/24/2016   Procedure: Right/Left Heart Cath and Coronary Angiography;  Surgeon: [name], MD;  Location: [facility];  Service: Cardiovascular;  Laterality: N/A;  . COLONOSCOPY    . EUS N/A 02/10/2014   Procedure: UPPER ENDOSCOPIC ULTRASOUND (EUS) LINEAR;  Surgeon: [name], MD;  [facility];  Service: Endoscopy;  Laterality: N/A;  . IR RADIOLOGIST EVAL & MGMT  02/11/2017  . KNEE SURGERY Right    age 24 for Baker's cyst  . NASAL SEPTUM SURGERY     Deviated septrum  . TEE WITHOUT  CARDIOVERSION N/A 04/16/2016   Procedure: TRANSESOPHAGEAL ECHOCARDIOGRAM (TEE);  Surgeon: [name], MD;  Location: [facility];  Service: Open Heart Surgery;  Laterality: N/A;  . THORACOTOMY  1990   wedge biopsy of mediastinal mass  . TRANSCATHETER AORTIC VALVE REPLACEMENT, TRANSFEMORAL N/A 04/16/2016   Procedure: TRANSCATHETER AORTIC VALVE REPLACEMENT, TRANSFEMORAL;  Surgeon: [name], MD;  Location: [facility];  Service: Open Heart Surgery;  Laterality: N/A;      Social History:  reports that he has never smoked. He has never used smokeless tobacco. He reports  current alcohol use. He reports that he does not use drugs. Family History:  Family History  Problem Relation Age of Onset  . Dementia Mother   . Cancer Paternal Grandfather        esophagus  . Cancer Maternal Aunt        colon  . Emphysema Maternal Aunt      HOME MEDICATIONS: Allergies as of 10/11/2019      Reactions   Penicillins Hives   ENTIRE BODY Has patient had a PCN reaction causing immediate rash, facial/tongue/throat swelling, SOB or lightheadedness with hypotension: No Has patient had a PCN reaction causing severe rash involving mucus membranes or skin necrosis: No Has patient had a PCN reaction that required hospitalization * *  YES  * * Has patient had a PCN reaction occurring within the last 10 years: No If all of the above answers are "NO", then may proceed with Cephalosporin use. *reaction occurred when he was 17      Medication List       Accurate as of October 11, 2019 11:23 AM. If you have any questions, ask your nurse or doctor.        acetaminophen 500 MG tablet Commonly known as: TYLENOL Take 1,000 mg by mouth every 6 (six) hours as needed for headache.   albuterol 108 (90 Base) MCG/ACT inhaler Commonly known as: VENTOLIN HFA Inhale 1-2 puffs into the lungs every 6 (six) hours as needed for wheezing or shortness of breath.   Blood Pressure Cuff Misc Use as directed   calcium carbonate 500 MG chewable tablet Commonly known as: TUMS - dosed in mg elemental calcium Chew 3 tablets by mouth 3 (three) times daily as needed for indigestion or heartburn.   clindamycin 300 MG capsule Commonly known as: CLEOCIN Take 2 capsules by mouth one hour prior to dental appointment   cyclobenzaprine 5 MG tablet Commonly known as: FLEXERIL Take 1 tablet (5 mg total) by mouth 3 (three) times daily as needed for muscle spasms.   docusate sodium 100 MG capsule Commonly known as: COLACE Take 100 mg by mouth daily as needed for mild constipation.   ibuprofen 200 MG  tablet Commonly known as: ADVIL Take 400 mg by mouth every 6 (six) hours as needed for headache or mild pain.   losartan-hydrochlorothiazide 100-25 MG tablet Commonly known as: HYZAAR Take 1 tablet by mouth daily. Overdue for Annual appt must see provider for future refills   metFORMIN 500 MG 24 hr tablet Commonly known as: GLUCOPHAGE-XR Take 2 tablets (1,000 mg total) by mouth daily with breakfast.   SANDOSTATIN IJ Inject 1 each as directed every 30 (thirty) days.   sildenafil 100 MG tablet Commonly known as: VIAGRA TAKE ONE-HALF TO ONE TABLET BY MOUTH DAILY AS NEEDED FOR ERECTILE DYSFUNCTION   simvastatin 20 MG tablet Commonly known as: ZOCOR TAKE ONE TABLET BY MOUTH DAILY AT 6PM   solifenacin 5 MG tablet Commonly known  as: VESIcare Take 1 tablet (5 mg total) by mouth daily.        ALLERGIES: Allergies  Allergen Reactions  . Penicillins Hives    ENTIRE BODY Has patient had a PCN reaction causing immediate rash, facial/tongue/throat swelling, SOB or lightheadedness with hypotension: No Has patient had a PCN reaction causing severe rash involving mucus membranes or skin necrosis: No Has patient had a PCN reaction that required hospitalization * *  YES  * * Has patient had a PCN reaction occurring within the last 10 years: No If all of the above answers are "NO", then may proceed with Cephalosporin use. *reaction occurred when he was 17     REVIEW OF SYSTEMS: A comprehensive ROS was conducted with the patient and is negative except as per HPI   OBJECTIVE:   VITAL SIGNS: BP 118/72 (BP Location: Left Arm, Patient Position: Sitting, Cuff Size: Normal)   Pulse 82   Temp 97.8 F (36.6 C)   Ht 5\' 10"  (1.778 m)   Wt 187 lb (84.8 kg)   SpO2 97%   BMI 26.83 kg/m    PHYSICAL EXAM:  General: Pt appears well and is in NAD  HEENT: Head: Unremarkable with good dentition. Oropharynx clear without exudate.   Neck: General: Supple without adenopathy or carotid bruits.  Thyroid: Thyroid size normal.  No goiter or nodules appreciated. No thyroid bruit.  Lungs: Clear with good BS bilat with no rales, rhonchi, or wheezes  Heart: RRR with normal S1 and S2 and no gallops; no murmurs; no rub  Abdomen: Normoactive bowel sounds, soft, nontender, without masses or organomegaly palpable  Extremities:  Lower extremities - No pretibial edema. No lesions.  Skin: Normal texture and temperature to palpation.   Neuro: MS is good with appropriate affect, pt is alert and Ox3    DM foot exam:   The skin of the feet is intact without sores or ulcerations. The pedal pulses are 2+ on right and 2+ on left. The decreased at the heels to a screening 5.07, 10 gram monofilament bilaterally   DATA REVIEWED:  Lab Results  Component Value Date   HGBA1C 9.7 (H) 09/22/2019   HGBA1C 6.3 06/18/2018   HGBA1C 6.1 02/11/2018   Lab Results  Component Value Date   LDLCALC 98 09/22/2019   CREATININE 1.20 09/29/2019     Lab Results  Component Value Date   CHOL 165 09/22/2019   HDL 53.50 09/22/2019   LDLCALC 98 09/22/2019   LDLDIRECT 139.2 10/10/2008   TRIG 68.0 09/22/2019   CHOLHDL 3 09/22/2019        ASSESSMENT / PLAN / RECOMMENDATIONS:   1) Type 2 Diabetes Mellitus, newly diagnosed- Most recent A1c of 9.7 %. Goal A1c < 7.0-7.5 %.    Plan: GENERAL: I have discussed with the patient the pathophysiology of diabetes. We went over the natural progression of the disease. We talked about both insulin resistance and insulin deficiency. We stressed the importance of lifestyle changes including diet and exercise. I explained the complications associated with diabetes including retinopathy, nephropathy, neuropathy as well as increased risk of cardiovascular disease. We went over the benefit seen with glycemic control.    I explained to the patient that diabetic patients are at higher than normal risk for amputations.   We did discuss that octreotide which is very important to  treating his neuroendocrine tumors can sometimes contribute to hypoglycemia, will also not sure how much of the tumor burden has affected his pancreatic function.  At this time he is tolerating Metformin without any side effects, his 2-hour postprandial BG was 149 mg/DL, I did discuss with him the importance of checking glucose at home, patient is pertinent with a neuroendocrine tumor, I have asked him to just try and check it 2-3 times a week, fasting just to make sure his BG's are below 180 mg/dL  He already exercises, I did encourage him to increase aerobic exercise when possible, he has already started to make dietary changes.  MEDICATIONS:  EDUCATION / INSTRUCTIONS:  BG monitoring instructions: Patient is instructed to check his blood sugars 3 times a week.  Call [facility] if: BG persistently < 70 or > 300. . I reviewed the Rule of 15 for the treatment of hypoglycemia in detail with the patient. Literature supplied.   2) Diabetic complications:   Eye: Does not have known diabetic retinopathy.  Patient advised to notify his ophthalmologist of new diagnosis of diabetes  Neuro/ Feet: Does not have known diabetic peripheral neuropathy.  Renal: Patient does not have known baseline CKD. He is on an ACEI/ARB at present.  3) Lipids: Patient is on a statin.    Follow-up in 3 months      Signed electronically by: Mack Guise, MD  [facility]  [address] [place], [place] [address] Phone: [phone] FAX: [phone]   CC: [name], [place] [address] Phone: [phone]  Fax: [phone]    Return to Endocrinology clinic as below: Future Appointments  Date Time Provider [place]  10/27/2019  7:30 AM [facility] LUTATHERA RM [facility] [place]  11/24/2019 11:00 AM [facility] ONLY [facility] None  11/24/2019 11:30 AM Ladell Pier, MD [facility] None   11/24/2019 12:00 PM [facility] [facility] None  12/22/2019  7:30 AM [facility] LUTATHERA RM [facility] [place]  09/21/2020 10:20 AM [name], MD [facility] None

## 2019-10-13 ENCOUNTER — Telehealth: Payer: Self-pay | Admitting: Internal Medicine

## 2019-10-13 ENCOUNTER — Other Ambulatory Visit: Payer: Self-pay

## 2019-10-13 MED ORDER — LANCET DEVICE MISC: 2 refills | Status: DC

## 2019-10-13 NOTE — Telephone Encounter (Signed)
Lancing device sent to pharmacy.

## 2019-10-13 NOTE — Telephone Encounter (Signed)
Patient was given a Human resources officer and the lancer is not working. He brought it in and the spring part is not working properly.  Please sent a Dance movement psychotherapist to [facility] on [address].

## 2019-10-13 NOTE — Telephone Encounter (Signed)
error 

## 2019-10-20 DIAGNOSIS — Z23 Encounter for immunization: Secondary | ICD-10-CM | POA: Diagnosis not present

## 2019-10-21 ENCOUNTER — Other Ambulatory Visit: Payer: Self-pay | Admitting: Internal Medicine

## 2019-10-21 NOTE — Telephone Encounter (Signed)
Please refill as per office routine med refill policy (all routine meds refilled for 3 mo or monthly per pt preference up to one year from last visit, then month to month grace period for 3 mo, then further med refills will have to be denied)  

## 2019-10-27 ENCOUNTER — Ambulatory Visit (HOSPITAL_COMMUNITY)
Admission: RE | Admit: 2019-10-27 | Discharge: 2019-10-27 | Disposition: A | Discharge status: Home / Self Care | Payer: Medicare Other | Source: Ambulatory Visit | Attending: Oncology | Admitting: Oncology

## 2019-10-27 ENCOUNTER — Other Ambulatory Visit: Payer: Self-pay

## 2019-10-27 ENCOUNTER — Other Ambulatory Visit (HOSPITAL_COMMUNITY): Payer: Self-pay | Admitting: Oncology

## 2019-10-27 ENCOUNTER — Ambulatory Visit: Payer: Medicare Other

## 2019-10-27 DIAGNOSIS — C7A8 Other malignant neuroendocrine tumors: Secondary | ICD-10-CM | POA: Diagnosis not present

## 2019-10-27 DIAGNOSIS — C7A1 Malignant poorly differentiated neuroendocrine tumors: Secondary | ICD-10-CM | POA: Diagnosis not present

## 2019-10-27 LAB — COMPREHENSIVE METABOLIC PANEL
ALT: 28 U/L (ref 0–44)
AST: 31 U/L (ref 15–41)
Albumin: 4.6 g/dL (ref 3.5–5.0)
Alkaline Phosphatase: 55 U/L (ref 38–126)
Anion gap: 7 (ref 5–15)
BUN: 28 mg/dL — ABNORMAL HIGH (ref 8–23)
CO2: 30 mmol/L (ref 22–32)
Calcium: 9.6 mg/dL (ref 8.9–10.3)
Chloride: 100 mmol/L (ref 98–111)
Creatinine, Ser: 1.11 mg/dL (ref 0.61–1.24)
GFR calc Af Amer: >60 mL/min (ref >60)
GFR calc non Af Amer: >60 mL/min (ref >60)
Glucose, Bld: 106 mg/dL — ABNORMAL HIGH (ref 70–99)
Potassium: 4.2 mmol/L (ref 3.5–5.1)
Sodium: 137 mmol/L (ref 135–145)
Total Bilirubin: 1.3 mg/dL — ABNORMAL HIGH (ref 0.3–1.2)
Total Protein: 7.3 g/dL (ref 6.5–8.1)

## 2019-10-27 LAB — CBC WITH DIFFERENTIAL/PLATELET
Abs Immature Granulocytes: 0 10*3/uL (ref 0.00–0.07)
Basophils Absolute: 0 10*3/uL (ref 0.0–0.1)
Basophils Relative: 1 %
Eosinophils Absolute: 0.1 10*3/uL (ref 0.0–0.5)
Eosinophils Relative: 2 %
HCT: 45.2 % (ref 39.0–52.0)
Hemoglobin: 14.9 g/dL (ref 13.0–17.0)
Immature Granulocytes: 0 %
Lymphocytes Relative: 11 %
Lymphs Abs: 0.5 10*3/uL — ABNORMAL LOW (ref 0.7–4.0)
MCH: 32.2 pg (ref 26.0–34.0)
MCHC: 33 g/dL (ref 30.0–36.0)
MCV: 97.6 fL (ref 80.0–100.0)
Monocytes Absolute: 0.6 10*3/uL (ref 0.1–1.0)
Monocytes Relative: 13 %
Neutro Abs: 3.3 10*3/uL (ref 1.7–7.7)
Neutrophils Relative %: 73 %
Platelets: 143 10*3/uL — ABNORMAL LOW (ref 150–400)
RBC: 4.63 MIL/uL (ref 4.22–5.81)
RDW: 12.5 % (ref 11.5–15.5)
WBC: 4.4 10*3/uL (ref 4.0–10.5)
nRBC: 0 % (ref 0.0–0.2)

## 2019-10-27 MED ORDER — SODIUM CHLORIDE 0.9 % IV SOLN
8.0000 mg | Freq: Once | INTRAVENOUS | Status: AC
  Administered 2019-10-27: 8 mg via INTRAVENOUS
  Filled 2019-10-27: qty 4

## 2019-10-27 MED ORDER — LUTETIUM LU 177 DOTATATE 370 MBQ/ML IV SOLN
200.0000 | Freq: Once | INTRAVENOUS | Status: AC
  Administered 2019-10-27: 201 via INTRAVENOUS

## 2019-10-27 MED ORDER — AMINO ACID RADIOPROTECTANT - L-LYSINE 2.5%/L-ARGININE 2.5% IN NS
250.0000 mL/h | INTRAVENOUS | Status: AC
  Administered 2019-10-27: 250 mL/h via INTRAVENOUS
  Filled 2019-10-27: qty 1000

## 2019-10-27 MED ORDER — OCTREOTIDE ACETATE 500 MCG/ML IJ SOLN: 500.0000 ug | Freq: Once | INTRAMUSCULAR | Status: DC | PRN

## 2019-10-27 MED ORDER — OCTREOTIDE ACETATE 30 MG IM KIT
PACK | INTRAMUSCULAR | Status: AC
  Filled 2019-10-27: qty 1

## 2019-10-27 MED ORDER — SODIUM CHLORIDE 0.9 % IV SOLN: 500.0000 mL | Freq: Once | INTRAVENOUS | Status: DC

## 2019-10-27 MED ORDER — PROCHLORPERAZINE EDISYLATE 10 MG/2ML IJ SOLN: 10.0000 mg | Freq: Four times a day (QID) | INTRAMUSCULAR | Status: DC | PRN

## 2019-10-27 MED ORDER — ONDANSETRON HCL 8 MG PO TABS: 8.0000 mg | ORAL_TABLET | Freq: Two times a day (BID) | ORAL | 0 refills | Status: DC | PRN

## 2019-10-27 MED ORDER — OCTREOTIDE ACETATE 500 MCG/ML IJ SOLN
INTRAMUSCULAR | Status: AC
  Filled 2019-10-27: qty 1

## 2019-10-27 MED ORDER — OCTREOTIDE ACETATE 30 MG IM KIT
30.0000 mg | PACK | Freq: Once | INTRAMUSCULAR | Status: AC
  Administered 2019-10-27: 30 mg via INTRAMUSCULAR

## 2019-10-27 NOTE — Progress Notes (Signed)
[  64 year-old [male] with metastatic neuroendocrine tumor. Well differentiated tumor with somatostatin receptor is identified within the [liver] by DOTATATE PET CT scan.  Patient completed peptide receptor radiotherapy on 08/27/2017 with positive response and progression-free survival for approximately 2 years..  Patient demonstrates subsequent progression after approximately 2 years with enlargement liver lesion.  Patient presents for re-treatment with peptide receptor radiotherapy.Marland Kitchen  EXAM: NUCLEAR MEDICINE LUTATHERA ADMINISTRATION TECHNIQUE: Infusion: The nuclear medicine technologist and I personally verified the dose activity ([200] mCi) to be delivered as specified in the written directive (200 mCi), and verified the patient identification via 2 separate methods. 20 gauge IV were started in the antecubital veins. Anti-emetics were administered by nursing staff. Amino acid renal protection was initiated 30 minutes prior to Lu 177 DOTATATE (Lutathera) infusion and continued continuously for 4 hours. Lutathera infusion was administered over 30 minutes.   The total administered dose was [201] mCi Lu 177 DOTATATE.  The entire IV tubing, venocatheter, stopcock and syringes was removed in total, placed in a disposal bag and sent for assay of the residual activity, which will be reported at a later time in our EMR by the physics staff. Pressure was applied to the venipuncture sites, and a compression bandage placed. Radiation Safety personnel were present to perform the discharge survey, as detailed on their documentation.  Patient received 30 mg IM long-acting Sandostatin injection 4 hours after Lutathera effusion in the nuclear medicine department.  RADIOPHARMACEUTICALS:  [201] mCi Lu 177 DOTATATE  FINDINGS: Diagnosis: [Metastatic neuroendocrine tumor.]  Current Infusion: [1]  Planned Infusions: [2]  Patient reports [minimal] carcinoid related symptoms. The patient's most recent  blood counts were reviewed and remains a good candidate to proceed with Lutathera. The patient was situated in an infusion suite and administered Lutathera as above. Patient will follow-up with referring oncologist for interval serum laboratories (CBC and CMP) in approximately 4 weeks.    Patient received 30 mg IM long-acting Sandostatin injection 4 hours after Lutathera effusion in the nuclear medicine department.   Four prior administrations Lu 177 DOTATATE from 03/05/2017 to 08/27/2017.   IMPRESSION: [First] [id number] DOTATATE RE- treatment for metastatic neuroendocrine tumor. The patient tolerated the infusion well. The patient will return in 8 weeks for ongoing care.

## 2019-11-06 ENCOUNTER — Other Ambulatory Visit: Payer: Self-pay | Admitting: Internal Medicine

## 2019-11-08 NOTE — Telephone Encounter (Signed)
Please refill as per office routine med refill policy (all routine meds refilled for 3 mo or monthly per pt preference up to one year from last visit, then month to month grace period for 3 mo, then further med refills will have to be denied)  

## 2019-11-10 DIAGNOSIS — Z23 Encounter for immunization: Secondary | ICD-10-CM | POA: Diagnosis not present

## 2019-11-24 ENCOUNTER — Other Ambulatory Visit: Payer: Self-pay

## 2019-11-24 ENCOUNTER — Inpatient Hospital Stay: Payer: Medicare Other

## 2019-11-24 ENCOUNTER — Telehealth: Payer: Self-pay | Admitting: Oncology

## 2019-11-24 ENCOUNTER — Inpatient Hospital Stay: Payer: Medicare Other | Attending: Oncology | Admitting: Oncology

## 2019-11-24 VITALS — BP 124/81 | HR 79 | Temp 97.8°F | Resp 17 | Ht 70.0 in | Wt 178.5 lb

## 2019-11-24 DIAGNOSIS — D696 Thrombocytopenia, unspecified: Secondary | ICD-10-CM | POA: Insufficient documentation

## 2019-11-24 DIAGNOSIS — C7A8 Other malignant neuroendocrine tumors: Secondary | ICD-10-CM | POA: Insufficient documentation

## 2019-11-24 DIAGNOSIS — D3A8 Other benign neuroendocrine tumors: Secondary | ICD-10-CM

## 2019-11-24 DIAGNOSIS — E119 Type 2 diabetes mellitus without complications: Secondary | ICD-10-CM | POA: Diagnosis not present

## 2019-11-24 DIAGNOSIS — C7B8 Other secondary neuroendocrine tumors: Secondary | ICD-10-CM | POA: Insufficient documentation

## 2019-11-24 LAB — CMP (CANCER CENTER ONLY)
ALT: 19 U/L (ref 0–44)
AST: 27 U/L (ref 15–41)
Albumin: 4.2 g/dL (ref 3.5–5.0)
Alkaline Phosphatase: 58 U/L (ref 38–126)
Anion gap: 8 (ref 5–15)
BUN: 23 mg/dL (ref 8–23)
CO2: 31 mmol/L (ref 22–32)
Calcium: 9.5 mg/dL (ref 8.9–10.3)
Chloride: 100 mmol/L (ref 98–111)
Creatinine: 1.17 mg/dL (ref 0.61–1.24)
GFR, Est AFR Am: >60 mL/min (ref >60)
GFR, Estimated: >60 mL/min (ref >60)
Glucose, Bld: 104 mg/dL — ABNORMAL HIGH (ref 70–99)
Potassium: 4.3 mmol/L (ref 3.5–5.1)
Sodium: 139 mmol/L (ref 135–145)
Total Bilirubin: 1.2 mg/dL (ref 0.3–1.2)
Total Protein: 6.9 g/dL (ref 6.5–8.1)

## 2019-11-24 LAB — CBC WITH DIFFERENTIAL (CANCER CENTER ONLY)
Abs Immature Granulocytes: 0.01 10*3/uL (ref 0.00–0.07)
Basophils Absolute: 0 10*3/uL (ref 0.0–0.1)
Basophils Relative: 1 %
Eosinophils Absolute: 0.1 10*3/uL (ref 0.0–0.5)
Eosinophils Relative: 2 %
HCT: 41.4 % (ref 39.0–52.0)
Hemoglobin: 13.9 g/dL (ref 13.0–17.0)
Immature Granulocytes: 0 %
Lymphocytes Relative: 6 %
Lymphs Abs: 0.3 10*3/uL — ABNORMAL LOW (ref 0.7–4.0)
MCH: 31.9 pg (ref 26.0–34.0)
MCHC: 33.6 g/dL (ref 30.0–36.0)
MCV: 95 fL (ref 80.0–100.0)
Monocytes Absolute: 0.6 10*3/uL (ref 0.1–1.0)
Monocytes Relative: 15 %
Neutro Abs: 3.2 10*3/uL (ref 1.7–7.7)
Neutrophils Relative %: 76 %
Platelet Count: 102 10*3/uL — ABNORMAL LOW (ref 150–400)
RBC: 4.36 MIL/uL (ref 4.22–5.81)
RDW: 12.4 % (ref 11.5–15.5)
WBC Count: 4.2 10*3/uL (ref 4.0–10.5)
nRBC: 0 % (ref 0.0–0.2)

## 2019-11-24 MED ORDER — PEGFILGRASTIM-CBQV 6 MG/0.6ML ~~LOC~~ SOSY
PREFILLED_SYRINGE | SUBCUTANEOUS | Status: AC
  Filled 2019-11-24: qty 0.6

## 2019-11-24 MED ORDER — OCTREOTIDE ACETATE 30 MG IM KIT
30.0000 mg | PACK | Freq: Once | INTRAMUSCULAR | Status: AC
  Administered 2019-11-24: 13:00:00 30 mg via INTRAMUSCULAR

## 2019-11-24 MED ORDER — OCTREOTIDE ACETATE 30 MG IM KIT
PACK | INTRAMUSCULAR | Status: AC
  Filled 2019-11-24: qty 1

## 2019-11-24 NOTE — Telephone Encounter (Signed)
Scheduled per 04/21 los, patient received printed calender.

## 2019-11-24 NOTE — Progress Notes (Signed)
[facility] OFFICE PROGRESS NOTE   Diagnosis: Pancreas neuroendocrine tumor  INTERVAL HISTORY:   Ronald Thompson returns as scheduled.  He reports intentional weight loss with changing his diet.  This is after the recent diagnosis of diabetes. He underwent Lutathera treatment on 10/27/2019.  He reports mild nausea and malaise for several days following treatment.  He developed a bruise at the right arm injection site.  He feels well at present.  No complaint today.  Objective:  Vital signs in last 24 hours:  Blood pressure 124/81, pulse 79, temperature 97.8 F (36.6 C), temperature source Temporal, resp. rate 17, height 5\' 10"  (1.778 m), weight 178 lb 8 oz (81 kg), SpO2 97 %.    GI: No hepatosplenomegaly, no mass, nontender, soft mobile suprapubic mass consistent with a lipoma Vascular: No leg edema, small palpable cord at the right forearm IV site, no erythema or tenderness  Skin: Brown discoloration at the pretibial area with a petechial rash bilaterally   Lab Results:  Lab Results  Component Value Date   WBC 4.2 11/24/2019   HGB 13.9 11/24/2019   HCT 41.4 11/24/2019   MCV 95.0 11/24/2019   PLT 102 (L) 11/24/2019   NEUTROABS 3.2 11/24/2019    CMP  Lab Results  Component Value Date   NA 139 11/24/2019   K 4.3 11/24/2019   CL 100 11/24/2019   CO2 31 11/24/2019   GLUCOSE 104 (H) 11/24/2019   BUN 23 11/24/2019   CREATININE 1.17 11/24/2019   CALCIUM 9.5 11/24/2019   PROT 6.9 11/24/2019   ALBUMIN 4.2 11/24/2019   AST 27 11/24/2019   ALT 19 11/24/2019   ALKPHOS 58 11/24/2019   BILITOT 1.2 11/24/2019   GFRNONAA >60 11/24/2019   GFRAA >60 11/24/2019     Medications: I have reviewed the patient's current medications.   Assessment/Plan: 1. Pancreatic neuroendocrine tumor, WHO grade 2, pancreatic head mass and small peripancreatic/celiac nodes on a CT 02/04/2014  EUS revealed evidence of multiple liver metastases-status post an FNA biopsy of a left liver  lesion 02/10/2014 confirming a neuroendocrine tumor   Octreotide scan 04/01/2014 with multiple foci of metastatic neuroendocrine tumor in the liver  Monthly Sandostatin started 03/16/2014  Restaging CT 07/04/2014 with resolution of a previously noted pancreas head lesion and probable progression of liver metastases  Restaging CT 10/31/2014-stable liver lesions, no evidence of a pancreas mass, stable.  Restaging CT 02/28/2015-stable hepatic metastases, stable pancreas lesion unchanged  Restaging CT 08/30/2015-stable pancreas mass, slight enlargement of hepatic metastases  Monthly Sandostatin continued  Restaging CTs06/14/2017 revealed enlargement of liver lesions, no new lesions, enlargement of the pancreas head mass  Gallium DOTATATEscan 10/01/2016 confirmed uptake in the liver metastases, pancreas primary, peripancreatic adenopathy, and left iliac bone  Cycle 1 Lutathera 03/05/2017  Cycle 2Lutathera09/26/2018  Cycle 3 Lutathera 07/02/2017  Cycle 4 Lutathera 08/27/2017  Netspotscan 09/29/2017-decreased radiotracer activity within the pancreas head, peripancreatic lymph node, and solitary skeletal lesion. Liver lesions have increased in size with a decrease in radiotracer activity  Monthly Sandostatin continued  Netspot scan 05/20/2018-stable to decreased size of liver lesions, most have decreased SUV, similar uptake in the pancreas lesion, previously noted peripancreatic lymph node has resolved him a mild decrease in uptake associated with a left iliac metastasis, no evidence of disease progression  Monthly Sandostatin continued  Netspot scan 01/29/2019- overall stable to improved, 1 lesion left hepatic lobe has increased tracer activity-unchanged in size, majority of hepatic lesions have reduced activity compared to the pretreatment scan,  decreased activity left iliac bone lesion, stable activity in the head of the pancreas lesion, no new lesions  Monthly Sandostatin  continued  Netspot on September 24, 2019-increase in size of 3 liver lesions with associated stable radiotracer activity, no new lesions, stable small left iliac lesion  Cycle 1 salvage Lutathera 10/27/2019  2. Chest Seminoma in 1990 treated with BEP and chest radiation at [facility] 3. 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 chronic exertional dyspnea following treatment for the seminoma  5. aortic stenosis on an echocardiogram December 2011, severe aortic stenosis on echocardiogram 12/06/2015, status postTAVR on 04/16/2016 6. lumbar disc surgery 2014  7. acute abdominal pain 02/04/2014-resolved  8.  Diabetes       Disposition: Ronald Thompson appears well.  He has completed 1 cycle of a second course of Lutathera.  He has mild thrombocytopenia, likely secondary to [place].  He will call for bleeding.  He will continue monthly Sandostatin.  He is scheduled for the next treatment with Lutathera on 12/22/2019.  He will return for an office and lab visit on 01/19/2020.  Betsy Coder, MD  11/24/2019  12:13 PM

## 2019-11-24 NOTE — Patient Instructions (Signed)
Octreotide injection solution °What is this medicine? °OCTREOTIDE (ok TREE oh tide) is used to reduce blood levels of growth hormone in patients with a condition called acromegaly. This medicine also reduces flushing and watery diarrhea caused by certain types of cancer. °This medicine may be used for other purposes; ask your health care provider or pharmacist if you have questions. °COMMON BRAND NAME(S): Bynfezia, Sandostatin, Sandostatin LAR °What should I tell my health care provider before I take this medicine? °They need to know if you have any of these conditions: °· diabetes °· gallbladder disease °· kidney disease °· liver disease °· an unusual or allergic reaction to octreotide, other medicines, foods, dyes, or preservatives °· pregnant or trying to get pregnant °· breast-feeding °How should I use this medicine? °This medicine is for injection under the skin or into a vein (only in emergency situations). It is usually given by a health care professional in a hospital or clinic setting. °If you get this medicine at home, you will be taught how to prepare and give this medicine. Allow the injection solution to come to room temperature before use. Do not warm it artificially. Use exactly as directed. Take your medicine at regular intervals. Do not take your medicine more often than directed. °It is important that you put your used needles and syringes in a special sharps container. Do not put them in a trash can. If you do not have a sharps container, call your pharmacist or healthcare provider to get one. °Talk to your pediatrician regarding the use of this medicine in children. Special care may be needed. °Overdosage: If you think you have taken too much of this medicine contact a poison control center or emergency room at once. °NOTE: This medicine is only for you. Do not share this medicine with others. °What if I miss a dose? °If you miss a dose, take it as soon as you can. If it is almost time for your  next dose, take only that dose. Do not take double or extra doses. °What may interact with this medicine? °Do not take this medicine with any of the following medications: °· cisapride °· droperidol °· general anesthetics °· grepafloxacin °· perphenazine °· thioridazine °This medicine may also interact with the following medications: °· bromocriptine °· cyclosporine °· diuretics °· medicines for blood pressure, heart disease, irregular heart beat °· medicines for diabetes, including insulin °· quinidine °This list may not describe all possible interactions. Give your health care provider a list of all the medicines, herbs, non-prescription drugs, or dietary supplements you use. Also tell them if you smoke, drink alcohol, or use illegal drugs. Some items may interact with your medicine. °What should I watch for while using this medicine? °Visit your doctor or health care professional for regular checks on your progress. °To help reduce irritation at the injection site, use a different site for each injection and make sure the solution is at room temperature before use. °This medicine may cause decreases in blood sugar. Signs of low blood sugar include chills, cool, pale skin or cold sweats, drowsiness, extreme hunger, fast heartbeat, headache, nausea, nervousness or anxiety, shakiness, trembling, unsteadiness, tiredness, or weakness. Contact your doctor or health care professional right away if you experience any of these symptoms. °This medicine may increase blood sugar. Ask your healthcare provider if changes in diet or medicines are needed if you have diabetes. °This medicine may cause a decrease in vitamin B12. You should make sure that you get enough vitamin B12   while you are taking this medicine. Discuss the foods you eat and the vitamins you take with your health care professional. °What side effects may I notice from receiving this medicine? °Side effects that you should report to your doctor or health care  professional as soon as possible: °· allergic reactions like skin rash, itching or hives, swelling of the face, lips, or tongue °· decreases in blood sugar °· changes in heart rate °· severe stomach pain °·  °signs and symptoms of high blood sugar such as being more thirsty or hungry or having to urinate more than normal. You may also feel very tired or have blurry vision. °Side effects that usually do not require medical attention (report to your doctor or health care professional if they continue or are bothersome): °· diarrhea or constipation °· gas or stomach pain °· nausea, vomiting °· pain, redness, swelling and irritation at site where injected °This list may not describe all possible side effects. Call your doctor for medical advice about side effects. You may report side effects to FDA at 1-800-FDA-1088. °Where should I keep my medicine? °Keep out of the reach of children. °Store in a refrigerator between 2 and 8 degrees C (36 and 46 degrees F). Protect from light. Allow to come to room temperature naturally. Do not use artificial heat. If protected from light, the injection may be stored at room temperature between 20 and 30 degrees C (70 and 86 degrees F) for 14 days. After the initial use, throw away any unused portion of a multiple dose vial after 14 days. Throw away unused portions of the ampules after use. °NOTE: This sheet is a summary. It may not cover all possible information. If you have questions about this medicine, talk to your doctor, pharmacist, or health care provider. °© 2020 [employer] (2018-04-30 08:07:09) ° °

## 2019-11-25 LAB — CHROMOGRANIN A: Chromogranin A (ng/mL): 2464 ng/mL — ABNORMAL HIGH (ref 0.0–101.8)

## 2019-12-20 ENCOUNTER — Other Ambulatory Visit (HOSPITAL_COMMUNITY): Payer: Self-pay | Admitting: Oncology

## 2019-12-20 DIAGNOSIS — C7A8 Other malignant neuroendocrine tumors: Secondary | ICD-10-CM

## 2019-12-22 ENCOUNTER — Ambulatory Visit (HOSPITAL_COMMUNITY)
Admission: RE | Admit: 2019-12-22 | Discharge: 2019-12-22 | Disposition: A | Discharge status: Home / Self Care | Payer: Medicare Other | Source: Ambulatory Visit | Attending: Oncology | Admitting: Oncology

## 2019-12-22 ENCOUNTER — Other Ambulatory Visit: Payer: Self-pay

## 2019-12-22 DIAGNOSIS — C7A8 Other malignant neuroendocrine tumors: Secondary | ICD-10-CM

## 2019-12-22 DIAGNOSIS — C7A1 Malignant poorly differentiated neuroendocrine tumors: Secondary | ICD-10-CM | POA: Diagnosis not present

## 2019-12-22 LAB — COMPREHENSIVE METABOLIC PANEL
ALT: 23 U/L (ref 0–44)
AST: 28 U/L (ref 15–41)
Albumin: 4.2 g/dL (ref 3.5–5.0)
Alkaline Phosphatase: 43 U/L (ref 38–126)
Anion gap: 11 (ref 5–15)
BUN: 25 mg/dL — ABNORMAL HIGH (ref 8–23)
CO2: 29 mmol/L (ref 22–32)
Calcium: 9.6 mg/dL (ref 8.9–10.3)
Chloride: 98 mmol/L (ref 98–111)
Creatinine, Ser: 1.1 mg/dL (ref 0.61–1.24)
GFR calc Af Amer: >60 mL/min (ref >60)
GFR calc non Af Amer: >60 mL/min (ref >60)
Glucose, Bld: 106 mg/dL — ABNORMAL HIGH (ref 70–99)
Potassium: 4.2 mmol/L (ref 3.5–5.1)
Sodium: 138 mmol/L (ref 135–145)
Total Bilirubin: 1.1 mg/dL (ref 0.3–1.2)
Total Protein: 6.3 g/dL — ABNORMAL LOW (ref 6.5–8.1)

## 2019-12-22 LAB — CBC WITH DIFFERENTIAL/PLATELET
Abs Immature Granulocytes: 0.02 10*3/uL (ref 0.00–0.07)
Basophils Absolute: 0 10*3/uL (ref 0.0–0.1)
Basophils Relative: 1 %
Eosinophils Absolute: 0.1 10*3/uL (ref 0.0–0.5)
Eosinophils Relative: 2 %
HCT: 42.8 % (ref 39.0–52.0)
Hemoglobin: 14.6 g/dL (ref 13.0–17.0)
Immature Granulocytes: 1 %
Lymphocytes Relative: 6 %
Lymphs Abs: 0.3 10*3/uL — ABNORMAL LOW (ref 0.7–4.0)
MCH: 33.4 pg (ref 26.0–34.0)
MCHC: 34.1 g/dL (ref 30.0–36.0)
MCV: 97.9 fL (ref 80.0–100.0)
Monocytes Absolute: 0.7 10*3/uL (ref 0.1–1.0)
Monocytes Relative: 17 %
Neutro Abs: 2.9 10*3/uL (ref 1.7–7.7)
Neutrophils Relative %: 73 %
Platelets: 154 10*3/uL (ref 150–400)
RBC: 4.37 MIL/uL (ref 4.22–5.81)
RDW: 12.5 % (ref 11.5–15.5)
WBC: 4 10*3/uL (ref 4.0–10.5)
nRBC: 0 % (ref 0.0–0.2)

## 2019-12-22 MED ORDER — OCTREOTIDE ACETATE 30 MG IM KIT: 30.0000 mg | PACK | Freq: Once | INTRAMUSCULAR | Status: AC

## 2019-12-22 MED ORDER — AMINO ACID RADIOPROTECTANT - L-LYSINE 2.5%/L-ARGININE 2.5% IN NS
250.0000 mL/h | INTRAVENOUS | Status: AC
  Administered 2019-12-22: 250 mL/h via INTRAVENOUS
  Filled 2019-12-22: qty 1000

## 2019-12-22 MED ORDER — PROCHLORPERAZINE EDISYLATE 10 MG/2ML IJ SOLN
INTRAMUSCULAR | Status: AC
  Filled 2019-12-22: qty 2

## 2019-12-22 MED ORDER — LUTETIUM LU 177 DOTATATE 370 MBQ/ML IV SOLN
200.0000 | Freq: Once | INTRAVENOUS | Status: AC
  Administered 2019-12-22: 200 via INTRAVENOUS

## 2019-12-22 MED ORDER — SODIUM CHLORIDE 0.9 % IV SOLN
8.0000 mg | Freq: Once | INTRAVENOUS | Status: AC
  Administered 2019-12-22: 8 mg via INTRAVENOUS
  Filled 2019-12-22: qty 4

## 2019-12-22 MED ORDER — ONDANSETRON HCL 8 MG PO TABS: 8.0000 mg | ORAL_TABLET | Freq: Two times a day (BID) | ORAL | 0 refills | Status: DC | PRN

## 2019-12-22 MED ORDER — OCTREOTIDE ACETATE 30 MG IM KIT
PACK | INTRAMUSCULAR | Status: AC
  Administered 2019-12-22: 30 mg via INTRAMUSCULAR
  Filled 2019-12-22: qty 1

## 2019-12-22 MED ORDER — PROCHLORPERAZINE EDISYLATE 10 MG/2ML IJ SOLN: 10.0000 mg | Freq: Four times a day (QID) | INTRAMUSCULAR | Status: DC | PRN

## 2019-12-22 MED ORDER — SODIUM CHLORIDE 0.9 % IV SOLN: 500.0000 mL | Freq: Once | INTRAVENOUS | Status: DC

## 2019-12-22 MED ORDER — OCTREOTIDE ACETATE 500 MCG/ML IJ SOLN
INTRAMUSCULAR | Status: AC
  Filled 2019-12-22: qty 1

## 2019-12-22 MED ORDER — OCTREOTIDE ACETATE 500 MCG/ML IJ SOLN: 500.0000 ug | Freq: Once | INTRAMUSCULAR | Status: DC | PRN

## 2019-12-22 NOTE — Progress Notes (Signed)
Dr. Clovis Riley made aware that following the Lutathera treatment, patient experienced a period of persistent coughing.  Patient on initial assessment following treatment was at 88% on room air and  HR 75.  Patient placed on .5L nasal cannula and increased to 95%.  Dr. Clovis Riley assessed the patient and lungs were clear.  Patient is alert and oriented.   Ronald Thompson 12/22/2019, 10:59 AM

## 2020-01-10 ENCOUNTER — Ambulatory Visit: Payer: Medicare Other | Admitting: Internal Medicine

## 2020-01-11 ENCOUNTER — Ambulatory Visit: Payer: Medicare Other | Admitting: Internal Medicine

## 2020-01-17 ENCOUNTER — Ambulatory Visit (INDEPENDENT_AMBULATORY_CARE_PROVIDER_SITE_OTHER): Payer: Medicare Other | Admitting: Internal Medicine

## 2020-01-17 ENCOUNTER — Encounter: Payer: Self-pay | Admitting: Internal Medicine

## 2020-01-17 ENCOUNTER — Other Ambulatory Visit: Payer: Self-pay

## 2020-01-17 VITALS — BP 116/68 | HR 78 | Resp 96 | Ht 70.0 in | Wt 174.0 lb

## 2020-01-17 DIAGNOSIS — E119 Type 2 diabetes mellitus without complications: Secondary | ICD-10-CM

## 2020-01-17 LAB — MICROALBUMIN / CREATININE URINE RATIO
Creatinine,U: 144.8 mg/dL
Microalb Creat Ratio: 1.3 mg/g (ref 0.0–30.0)
Microalb, Ur: 1.9 mg/dL (ref 0.0–1.9)

## 2020-01-17 LAB — POCT GLYCOSYLATED HEMOGLOBIN (HGB A1C): Hemoglobin A1C: 5.6 % (ref 4.0–5.6)

## 2020-01-17 MED ORDER — METFORMIN HCL ER 500 MG PO TB24: 500.0000 mg | ORAL_TABLET | Freq: Every day | ORAL | 3 refills | Status: DC

## 2020-01-17 NOTE — Patient Instructions (Addendum)
-   Keep up the Good Work !!! - Decrease  Metformin 500 mg XR, ONE tablet with Breakfast  - Fasting goal 70-130 mg/dL

## 2020-01-17 NOTE — Progress Notes (Signed)
Name: Ronald Thompson  Age/ Sex: 64 y.o., male   MRN/ DOB: [id number], 04-08-1956     PCP: [name], MD   Reason for Endocrinology Evaluation: Type 2 Diabetes Mellitus  Initial Endocrine Consultative Visit: 10/11/2019    PATIENT IDENTIFIER: Ronald Thompson is a 64 y.o. male with a past medical history of HTN and pancreatic neuroendocrine tumor 02/05/2014). The patient has followed with Endocrinology clinic since 10/11/2019 for consultative assistance with management of his diabetes.  DIABETIC HISTORY:  Ronald Thompson was diagnosed with DM in 09/2019. He was started on Metformin. His hemoglobin A1c was 9.7% on initial diagnosis.   Was diagnosed with neuroendocrine tumor in 2015, he is status post Lutathera , and has been on octreotide treatments since 2015. SUBJECTIVE:   During the last visit (10/11/2019): A1c 9.7%. Continued metformin   Today (01/17/2020): Mr. Liddy is here for a follow up on diabetes management.  He checks his blood sugars 3 times daily, week. The patient has not had hypoglycemic episodes since the last clinic visit  Last treatment for neuroendocrine tumor was in 12/2019. Has a pending scan in 02/2020. He did have nausea and vomiting following the treatment but otherwise tolerating metformin well.     HOME DIABETES REGIMEN:  Metformin 500 mg XR 2 tabs daily      METER DOWNLOAD SUMMARY: Date range evaluated: 6/1-6/14/2021 Fingerstick Blood Glucose Tests = 4 Average Number Tests/Day = 0.3 Overall Mean FS Glucose = 107   BG Ranges: Low = 103 High = 114   Hypoglycemic Events/30 Days: BG < 50 = 0 Episodes of symptomatic severe hypoglycemia = 0   DIABETIC COMPLICATIONS: Microvascular complications:    Denies: CKD, retinopathy, neuropathy  Last eye exam: Completed 2020  Macrovascular complications:   S/P Aortic valve replacement.   Denies: CAD, PVD, CVA    HISTORY:  Past Medical History:  Past Medical History:  Diagnosis Date  . Baker's cyst   .  Gallstones   . Heart murmur   . Hypercholesteremia   . Hypertension   . Lesion of left lung    hx.25 yrs ago- testicilar cancer related- only scarring left after tx.-no problems now  . Liver metastasis ([place])   . 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  . Pericarditis    2nd to tumor  . Pneumonia 1990  . Primary neuroendocrine tumor of pancreas 02/14/2014   Stage IV with multiple mets to liver  . Pulmonary fibrosis ([place]) 11/08/2015  . S/P TAVR (transcatheter aortic valve replacement) 04/16/2016   26 mm Edwards Sapien 3 transcatheter heart valve placed via percutaneous right transfemoral approach   . Shortness of breath dyspnea    with exertion and when tired  . 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 Transfusion history    hx. 25 yrs ago-during cancer tx.   Past Surgical History:  Past Surgical History:  Procedure Laterality Date  . BACK SURGERY     '14- rupt. disc   . CARDIAC CATHETERIZATION N/A 01/24/2016   Procedure: Right/Left Heart Cath and Coronary Angiography;  Surgeon: [name], MD;  Location: [facility];  Service: Cardiovascular;  Laterality: N/A;  . COLONOSCOPY    . EUS N/A 02/10/2014   Procedure: UPPER ENDOSCOPIC ULTRASOUND (EUS) LINEAR;  Surgeon: [name], MD;  [facility];  Service: Endoscopy;  Laterality: N/A;  . IR RADIOLOGIST EVAL & MGMT  02/11/2017  . KNEE SURGERY Right    age 65 for Baker's cyst  . NASAL SEPTUM SURGERY     Deviated septrum  . TEE WITHOUT CARDIOVERSION N/A 04/16/2016   Procedure: TRANSESOPHAGEAL ECHOCARDIOGRAM (TEE);  Surgeon: [name], MD;  Location: [facility];  Service: Open Heart Surgery;  Laterality: N/A;  . THORACOTOMY  1990   wedge biopsy of mediastinal mass  . TRANSCATHETER AORTIC VALVE REPLACEMENT, TRANSFEMORAL N/A 04/16/2016   Procedure: TRANSCATHETER  AORTIC VALVE REPLACEMENT, TRANSFEMORAL;  Surgeon: [name], MD;  Location: [facility];  Service: Open Heart Surgery;  Laterality: N/A;    Social History:  reports that he has never smoked. He has never used smokeless tobacco. He reports current alcohol use. He reports that he does not use drugs. Family History:  Family History  Problem Relation Age of Onset  . Dementia Mother   . Cancer Paternal Grandfather        esophagus  . Cancer Maternal Aunt        colon  . Emphysema Maternal Aunt      HOME MEDICATIONS: Allergies as of 01/17/2020      Reactions   Penicillins Hives   ENTIRE BODY Has patient had a PCN reaction causing immediate rash, facial/tongue/throat swelling, SOB or lightheadedness with hypotension: No Has patient had a PCN reaction causing severe rash involving mucus membranes or skin necrosis: No Has patient had a PCN reaction that required hospitalization * *  YES  * * Has patient had a PCN reaction occurring within the last 10 years: No If all of the above answers are "NO", then may proceed with Cephalosporin use. *reaction occurred when he was 17      Medication List       Accurate as of January 17, 2020 11:43 AM. If you have any questions, ask your nurse or doctor.        acetaminophen 500 MG tablet Commonly known as: TYLENOL Take 1,000 mg by mouth every 6 (six) hours as needed for headache.   albuterol 108 (90 Base) MCG/ACT inhaler Commonly known as: VENTOLIN HFA Inhale 1-2 puffs into the lungs every 6 (six) hours as needed for wheezing or shortness of breath.   Blood Pressure Cuff Misc Use as directed   calcium carbonate 500 MG chewable tablet Commonly known as: TUMS - dosed in mg elemental calcium Chew 3 tablets by mouth 3 (three) times daily as needed for indigestion or heartburn.   clindamycin 300 MG capsule Commonly known as: CLEOCIN Take 2 capsules by mouth one hour prior to dental appointment   cyclobenzaprine 5 MG tablet Commonly known as:  FLEXERIL Take 1 tablet (5 mg total) by mouth 3 (three) times daily as needed for muscle spasms.   docusate sodium 100 MG capsule Commonly known as: COLACE Take 100 mg by mouth daily as needed for mild constipation.   ibuprofen 200 MG tablet Commonly known as: ADVIL Take 400 mg by mouth every 6 (six) hours as needed for headache or mild pain.   Lancet Device Misc Use as instructed to test blood sugar daily E11.65   losartan-hydrochlorothiazide 100-25 MG tablet Commonly known as: HYZAAR TAKE 1 TABLET BY MOUTH DAILY   metFORMIN 500 MG 24 hr tablet Commonly known as: GLUCOPHAGE-XR Take 2 tablets (1,000 mg total) by mouth daily with breakfast.   ondansetron 8 MG tablet Commonly known as: ZOFRAN Take 1 tablet (8 mg total) by mouth 2 (two) times  daily as needed for nausea or vomiting.   ondansetron 8 MG tablet Commonly known as: ZOFRAN Take 1 tablet (8 mg total) by mouth 2 (two) times daily as needed for nausea or vomiting.   OneTouch Delica Lancets 92E Misc Use as instructed to test blood sugar once daily E11.65   OneTouch Verio test strip Generic drug: glucose blood Use as instructed to test blood sugar one time daily E11.65   SANDOSTATIN IJ Inject 1 each as directed every 30 (thirty) days.   sildenafil 100 MG tablet Commonly known as: VIAGRA TAKE ONE-HALF TO ONE TABLET BY MOUTH DAILY AS NEEDED FOR ERECTILE DYSFUNCTION   simvastatin 20 MG tablet Commonly known as: ZOCOR TAKE ONE TABLET BY MOUTH DAILY AT 6PM   solifenacin 5 MG tablet Commonly known as: VESIcare Take 1 tablet (5 mg total) by mouth daily.        OBJECTIVE:   Vital Signs: BP 116/68 (BP Location: Left Arm, Patient Position: Sitting, Cuff Size: Normal)   Pulse 78   Resp (!) 96   Ht 5\' 10"  (1.778 m)   Wt 174 lb (78.9 kg)   BMI 24.97 kg/m   Wt Readings from Last 3 Encounters:  01/17/20 174 lb (78.9 kg)  11/24/19 178 lb 8 oz (81 kg)  10/11/19 187 lb (84.8 kg)     Exam: General: Pt appears  well and is in NAD  Lungs: Clear with good BS bilat with no rales, rhonchi, or wheezes  Heart: RRR with normal S1 and S2 and no gallops; no murmurs; no rub  Abdomen: Normoactive bowel sounds, soft, nontender, without masses or organomegaly palpable  Extremities: No pretibial edema.   Skin: Normal texture and temperature to palpation.   Neuro: MS is good with appropriate affect, pt is alert and Ox3     DM foot exam: 01/17/2020  The skin of the feet is intact without sores or ulcerations. The pedal pulses are 2+ on right and 2+ on left. The sensation is intact to a screening 5.07, 10 gram monofilament bilaterally    DATA REVIEWED:  Lab Results  Component Value Date   HGBA1C 5.6 01/17/2020   HGBA1C 9.7 (H) 09/22/2019   HGBA1C 6.3 06/18/2018   Lab Results  Component Value Date   LDLCALC 98 09/22/2019   CREATININE 1.10 12/22/2019     Lab Results  Component Value Date   CHOL 165 09/22/2019   HDL 53.50 09/22/2019   LDLCALC 98 09/22/2019   LDLDIRECT 139.2 10/10/2008   TRIG 68.0 09/22/2019   CHOLHDL 3 09/22/2019       Results for Isaacks, [place] "ED" (MRN [id number]) as of 01/18/2020 07:31  Ref. Range 01/17/2020 11:53  Creatinine,U Latest Units: mg/dL 144.8  Microalb, Ur Latest Ref Range: 0.0 - 1.9 mg/dL 1.9  MICROALB/CREAT RATIO Latest Ref Range: 0.0 - 30.0 mg/g 1.3    ASSESSMENT / PLAN / RECOMMENDATIONS:   1) Type 2 Diabetes Mellitus, optimally controlled, Without complications - Most recent A1c of 5.6 %. Goal A1c < 7.0 %.    - I have praised the pt on optimal glucose control, weight loss and lifestyle changes. He has made drastic changes cutting out snacks, sweets and sugar-sweetened beverages  - I have encouraged him to continue with lifestyle changes. - Will reduce metformin as below     MEDICATIONS:  Decrease Metformin 500 mg XR, to 1 tablet daily   EDUCATION / INSTRUCTIONS:  BG monitoring instructions: Patient is instructed to check his blood sugars 3 times a  day, week.  Call [facility] if: BG persistently < 70 or > 300. . I reviewed the Rule of 15 for the treatment of hypoglycemia in detail with the patient. Literature supplied.     2) Diabetic complications:   Eye: Does not have known diabetic retinopathy.   Neuro/ Feet: Does not have known diabetic peripheral neuropathy .   Renal: Patient does not have known baseline CKD. Microalbuminuria is normal    F/U in 6 months   Signed electronically by: Mack Guise, MD  [facility]  [address] [place], [place] [address] Phone: [phone] FAX: [phone]   CC: [name], [place] [address] Phone: [phone]  Fax: [phone]  Return to Endocrinology clinic as below: Future Appointments  Date Time Provider [place]  01/19/2020 11:00 AM [facility] LAB 5 [facility] None  01/19/2020 11:45 AM Owens Shark, NP [facility] None  01/19/2020 12:15 PM [facility] [place] FLUSH [facility] None  02/15/2020 11:00 AM [facility] PET CT 1 [facility] [place]  02/15/2020  1:00 PM [facility] LUTATHERA RM [facility] [place]  09/21/2020 10:20 AM [name], MD [facility] None

## 2020-01-19 ENCOUNTER — Inpatient Hospital Stay (HOSPITAL_BASED_OUTPATIENT_CLINIC_OR_DEPARTMENT_OTHER): Payer: Medicare Other | Admitting: Nurse Practitioner

## 2020-01-19 ENCOUNTER — Encounter: Payer: Self-pay | Admitting: Nurse Practitioner

## 2020-01-19 ENCOUNTER — Inpatient Hospital Stay: Payer: Medicare Other | Attending: Oncology

## 2020-01-19 ENCOUNTER — Inpatient Hospital Stay: Payer: Medicare Other

## 2020-01-19 ENCOUNTER — Other Ambulatory Visit: Payer: Self-pay

## 2020-01-19 VITALS — BP 125/76 | HR 84 | Temp 97.9°F | Resp 18 | Ht 70.0 in | Wt 172.1 lb

## 2020-01-19 DIAGNOSIS — D3A8 Other benign neuroendocrine tumors: Secondary | ICD-10-CM

## 2020-01-19 DIAGNOSIS — C7A8 Other malignant neuroendocrine tumors: Secondary | ICD-10-CM | POA: Insufficient documentation

## 2020-01-19 DIAGNOSIS — C7B8 Other secondary neuroendocrine tumors: Secondary | ICD-10-CM | POA: Diagnosis present

## 2020-01-19 LAB — CBC WITH DIFFERENTIAL (CANCER CENTER ONLY)
Abs Immature Granulocytes: 0 10*3/uL (ref 0.00–0.07)
Basophils Absolute: 0 10*3/uL (ref 0.0–0.1)
Basophils Relative: 0 %
Eosinophils Absolute: 0.1 10*3/uL (ref 0.0–0.5)
Eosinophils Relative: 2 %
HCT: 39.2 % (ref 39.0–52.0)
Hemoglobin: 13.7 g/dL (ref 13.0–17.0)
Immature Granulocytes: 0 %
Lymphocytes Relative: 6 %
Lymphs Abs: 0.3 10*3/uL — ABNORMAL LOW (ref 0.7–4.0)
MCH: 33.9 pg (ref 26.0–34.0)
MCHC: 34.9 g/dL (ref 30.0–36.0)
MCV: 97 fL (ref 80.0–100.0)
Monocytes Absolute: 0.6 10*3/uL (ref 0.1–1.0)
Monocytes Relative: 14 %
Neutro Abs: 3.6 10*3/uL (ref 1.7–7.7)
Neutrophils Relative %: 78 %
Platelet Count: 103 10*3/uL — ABNORMAL LOW (ref 150–400)
RBC: 4.04 MIL/uL — ABNORMAL LOW (ref 4.22–5.81)
RDW: 12.4 % (ref 11.5–15.5)
WBC Count: 4.5 10*3/uL (ref 4.0–10.5)
nRBC: 0 % (ref 0.0–0.2)

## 2020-01-19 LAB — CMP (CANCER CENTER ONLY)
ALT: 22 U/L (ref 0–44)
AST: 29 U/L (ref 15–41)
Albumin: 4.3 g/dL (ref 3.5–5.0)
Alkaline Phosphatase: 53 U/L (ref 38–126)
Anion gap: 9 (ref 5–15)
BUN: 23 mg/dL (ref 8–23)
CO2: 31 mmol/L (ref 22–32)
Calcium: 9.7 mg/dL (ref 8.9–10.3)
Chloride: 100 mmol/L (ref 98–111)
Creatinine: 1.25 mg/dL — ABNORMAL HIGH (ref 0.61–1.24)
GFR, Est AFR Am: >60 mL/min (ref >60)
GFR, Estimated: 60 mL/min — ABNORMAL LOW (ref >60)
Glucose, Bld: 95 mg/dL (ref 70–99)
Potassium: 4.2 mmol/L (ref 3.5–5.1)
Sodium: 140 mmol/L (ref 135–145)
Total Bilirubin: 1.4 mg/dL — ABNORMAL HIGH (ref 0.3–1.2)
Total Protein: 7 g/dL (ref 6.5–8.1)

## 2020-01-19 MED ORDER — OCTREOTIDE ACETATE 30 MG IM KIT
30.0000 mg | PACK | Freq: Once | INTRAMUSCULAR | Status: AC
  Administered 2020-01-19: 30 mg via INTRAMUSCULAR

## 2020-01-19 MED ORDER — OCTREOTIDE ACETATE 30 MG IM KIT
PACK | INTRAMUSCULAR | Status: AC
  Filled 2020-01-19: qty 1

## 2020-01-19 NOTE — Patient Instructions (Signed)
Octreotide injection solution What is this medicine? OCTREOTIDE (ok TREE oh tide) is used to reduce blood levels of growth hormone in patients with a condition called acromegaly. This medicine also reduces flushing and watery diarrhea caused by certain types of cancer. This medicine may be used for other purposes; ask your health care provider or pharmacist if you have questions. COMMON BRAND NAME(S): Bynfezia, Sandostatin What should I tell my health care provider before I take this medicine? They need to know if you have any of these conditions:  diabetes  gallbladder disease  kidney disease  liver disease  thyroid disease  an unusual or allergic reaction to octreotide, other medicines, foods, dyes, or preservatives  pregnant or trying to get pregnant  breast-feeding How should I use this medicine? This medicine is for injection under the skin or into a vein (only in emergency situations). It is usually given by a health care professional in a hospital or clinic setting. If you get this medicine at home, you will be taught how to prepare and give this medicine. Allow the injection solution to come to room temperature before use. Do not warm it artificially. Use exactly as directed. Take your medicine at regular intervals. Do not take your medicine more often than directed. It is important that you put your used needles and syringes in a special sharps container. Do not put them in a trash can. If you do not have a sharps container, call your pharmacist or healthcare provider to get one. Talk to your pediatrician regarding the use of this medicine in children. Special care may be needed. Overdosage: If you think you have taken too much of this medicine contact a poison control center or emergency room at once. NOTE: This medicine is only for you. Do not share this medicine with others. What if I miss a dose? If you miss a dose, take it as soon as you can. If it is almost time for your  next dose, take only that dose. Do not take double or extra doses. What may interact with this medicine?  bromocriptine  certain medicines for blood pressure, heart disease, irregular heartbeat  cyclosporine  diuretics  medicines for diabetes, including insulin  quinidine This list may not describe all possible interactions. Give your health care provider a list of all the medicines, herbs, non-prescription drugs, or dietary supplements you use. Also tell them if you smoke, drink alcohol, or use illegal drugs. Some items may interact with your medicine. What should I watch for while using this medicine? Visit your doctor or health care professional for regular checks on your progress. To help reduce irritation at the injection site, use a different site for each injection and make sure the solution is at room temperature before use. This medicine may cause decreases in blood sugar. Signs of low blood sugar include chills, cool, pale skin or cold sweats, drowsiness, extreme hunger, fast heartbeat, headache, nausea, nervousness or anxiety, shakiness, trembling, unsteadiness, tiredness, or weakness. Contact your doctor or health care professional right away if you experience any of these symptoms. This medicine may increase blood sugar. Ask your healthcare provider if changes in diet or medicines are needed if you have diabetes. This medicine may cause a decrease in vitamin B12. You should make sure that you get enough vitamin B12 while you are taking this medicine. Discuss the foods you eat and the vitamins you take with your health care professional. What side effects may I notice from receiving this medicine? Side   effects that you should report to your doctor or health care professional as soon as possible:  allergic reactions like skin rash, itching or hives, swelling of the face, lips, or tongue  fast, slow, or irregular heartbeat  right upper belly pain  severe stomach pain  signs  and symptoms of high blood sugar such as being more thirsty or hungry or having to urinate more than normal. You may also feel very tired or have blurry vision.  signs and symptoms of low blood sugar such as feeling anxious; confusion; dizziness; increased hunger; unusually weak or tired; increased sweating; shakiness; cold, clammy skin; irritable; headache; blurred vision; fast heartbeat; loss of consciousness  unusually weak or tired Side effects that usually do not require medical attention (report to your doctor or health care professional if they continue or are bothersome):  diarrhea  dizziness  gas  headache  nausea, vomiting  pain, redness, or irritation at site where injected  upset stomach This list may not describe all possible side effects. Call your doctor for medical advice about side effects. You may report side effects to FDA at 1-800-FDA-1088. Where should I keep my medicine? Keep out of the reach of children. Store in a refrigerator between 2 and 8 degrees C (36 and 46 degrees F). Protect from light. Allow to come to room temperature naturally. Do not use artificial heat. If protected from light, the injection may be stored at room temperature between 20 and 30 degrees C (70 and 86 degrees F) for 14 days. After the initial use, throw away any unused portion of a multiple dose vial after 14 days. Throw away unused portions of the ampules after use. NOTE: This sheet is a summary. It may not cover all possible information. If you have questions about this medicine, talk to your doctor, pharmacist, or health care provider.  2020 Elsevier/Gold Standard (2019-02-18 13:33:09)  

## 2020-01-19 NOTE — Progress Notes (Signed)
[facility] OFFICE PROGRESS NOTE   Diagnosis: Pancreas neuroendocrine tumor  INTERVAL HISTORY:   Ronald Thompson returns as scheduled.  He completed a second treatment with Lutathera 12/22/2019.  He had nausea for several days relieved with his home antiemetic.  He denies diarrhea.  No flushing episodes.  He has lost some weight.  He attributes this to a change in his diet.  He feels well.  Objective:  Vital signs in last 24 hours:  Blood pressure 125/76, pulse 84, temperature 97.9 F (36.6 C), temperature source Temporal, resp. rate 18, height 5\' 10"  (1.778 m), weight 172 lb 1.6 oz (78.1 kg), SpO2 96 %.    HEENT: No thrush or ulcers. Resp: Lungs are clear.  Breath sounds diminished at the left lower lung field.  No respiratory distress. Cardio: Regular rate and rhythm. GI: Abdomen soft and nontender.  No mass.  No hepatomegaly. Vascular: No leg edema.  Lab Results:  Lab Results  Component Value Date   WBC 4.5 01/19/2020   HGB 13.7 01/19/2020   HCT 39.2 01/19/2020   MCV 97.0 01/19/2020   PLT 103 (L) 01/19/2020   NEUTROABS 3.6 01/19/2020    Imaging:  No results found.  Medications: I have reviewed the patient's current medications.  Assessment/Plan: 1. Pancreatic neuroendocrine tumor, WHO grade 2, pancreatic head mass and small peripancreatic/celiac nodes on a CT 02/04/2014  EUS revealed evidence of multiple liver metastases-status post an FNA biopsy of a left liver lesion 02/10/2014 confirming a neuroendocrine tumor   Octreotide scan 04/01/2014 with multiple foci of metastatic neuroendocrine tumor in the liver  Monthly Sandostatin started 03/16/2014  Restaging CT 07/04/2014 with resolution of a previously noted pancreas head lesion and probable progression of liver metastases  Restaging CT 10/31/2014-stable liver lesions, no evidence of a pancreas mass, stable.  Restaging CT 02/28/2015-stable hepatic metastases, stable pancreas lesion  unchanged  Restaging CT 08/30/2015-stable pancreas mass, slight enlargement of hepatic metastases  Monthly Sandostatin continued  Restaging CTs06/14/2017 revealed enlargement of liver lesions, no new lesions, enlargement of the pancreas head mass  Gallium DOTATATEscan 10/01/2016 confirmed uptake in the liver metastases, pancreas primary, peripancreatic adenopathy, and left iliac bone  Cycle 1 Lutathera 03/05/2017  Cycle 2Lutathera09/26/2018  Cycle 3 Lutathera 07/02/2017  Cycle4Lutathera 08/27/2017  Netspotscan 09/29/2017-decreased radiotracer activity within the pancreas head,peripancreaticlymph node, and solitary skeletal lesion. Liver lesions have increased in size with a decrease in radiotracer activity  Monthly Sandostatin continued  Netspot scan 05/20/2018-stable to decreased size of liver lesions, most have decreased SUV, similar uptake in the pancreas lesion, previously noted peripancreatic lymph node has resolved him a mild decrease in uptake associated with a left iliac metastasis, no evidence of disease progression  Monthly Sandostatin continued  Netspot scan 01/29/2019- overall stable to improved, 1 lesion left hepatic lobe has increased tracer activity-unchanged in size, majority of hepatic lesions have reduced activity compared to the pretreatment scan, decreased activity left iliac bone lesion, stable activity in the head of the pancreas lesion, no new lesions  Monthly Sandostatin continued  Netspot on September 24, 2019-increase in size of 3 liver lesions with associated stable radiotracer activity, no new lesions, stable small left iliac lesion  Cycle 1 salvage Lutathera 10/27/2019  Cycle 2 salvage Lutathera 12/22/2019  2. Chest Seminoma in 1990 treated with BEP and chest radiation at [facility] 3. 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  chronic exertional dyspnea following treatment for the seminoma  5. aortic stenosis on an echocardiogram December 2011, severe aortic stenosis on echocardiogram 12/06/2015, status postTAVR on 04/16/2016 6. lumbar disc surgery 2014  7. acute abdominal pain 02/04/2014-resolved  8.  Diabetes  Disposition: [name] appears stable.  He completed cycle 2 of a second course of Lutathera on 12/22/2019.  He is scheduled for a restaging Netspot next month.  He will continue monthly Sandostatin, injection today.  We reviewed the CBC from today.  He has mild thrombocytopenia.  This is likely secondary to [place].  He will contact the office with any bleeding.  He will return for lab, follow-up, Sandostatin in 4 weeks.  He will contact the office in the interim with any problems.   Plan reviewed with Dr. [name].    Ned Card ANP/GNP-BC   01/19/2020  11:57 AM

## 2020-01-21 ENCOUNTER — Telehealth: Payer: Self-pay | Admitting: Nurse Practitioner

## 2020-01-21 NOTE — Telephone Encounter (Signed)
Scheduled per los. Called and left msg. Mailed printout  °

## 2020-01-27 ENCOUNTER — Ambulatory Visit (INDEPENDENT_AMBULATORY_CARE_PROVIDER_SITE_OTHER): Payer: Medicare Other

## 2020-01-27 DIAGNOSIS — Z0000 Encounter for general adult medical examination without abnormal findings: Secondary | ICD-10-CM | POA: Diagnosis not present

## 2020-01-27 NOTE — Patient Instructions (Signed)
Ronald Thompson , Thank you for taking time to come for your Medicare Wellness Visit. I appreciate your ongoing commitment to your health goals. Please review the following plan we discussed and let me know if I can assist you in the future.   Screening recommendations/referrals: Colonoscopy: due every 10 years; due 2026 Recommended yearly ophthalmology/optometry visit for glaucoma screening and checkup Recommended yearly dental visit for hygiene and checkup  Vaccinations: Influenza vaccine: 06/09/2019 Pneumococcal vaccine: Prevnar 09/22/2019; need Pneumovax  Tdap vaccine: 11/18/2011; due every 10 years Shingles vaccine: completed   Covid-19: completed  Advanced directives: Please bring a copy of your health care power of attorney and living will to the office at your convenience.  Conditions/risks identified: Please continue to do your personal lifestyle choices by: daily care of teeth and gums, regular physical activity (goal should be 5 days a week for 30 minutes), eat a healthy diet, avoid tobacco and drug use, limiting any alcohol intake, taking a low-dose aspirin (if not allergic or have been advised by your provider otherwise) and taking vitamins and minerals as recommended by your provider. Continue doing brain stimulating activities (puzzles, reading, adult coloring books, staying active) to keep memory sharp. Continue to eat heart healthy diet (full of fruits, vegetables, whole grains, lean protein, water--limit salt, fat, and sugar intake) and increase physical activity as tolerated.  Next appointment: Please schedule your next Medicare Wellness Visit with your Nurse Health Advisor in 1 year.  Preventive Care 66 Years and Older, Male Preventive care refers to lifestyle choices and visits with your health care provider that can promote health and wellness. What does preventive care include?  A yearly physical exam. This is also called an annual well check.  Dental exams once or twice a  year.  Routine eye exams. Ask your health care provider how often you should have your eyes checked.  Personal lifestyle choices, including:  Daily care of your teeth and gums.  Regular physical activity.  Eating a healthy diet.  Avoiding tobacco and drug use.  Limiting alcohol use.  Practicing safe sex.  Taking low doses of aspirin every day.  Taking vitamin and mineral supplements as recommended by your health care provider. What happens during an annual well check? The services and screenings done by your health care provider during your annual well check will depend on your age, overall health, lifestyle risk factors, and family history of disease. Counseling  Your health care provider may ask you questions about your:  Alcohol use.  Tobacco use.  Drug use.  Emotional well-being.  Home and relationship well-being.  Sexual activity.  Eating habits.  History of falls.  Memory and ability to understand (cognition).  Work and work Statistician. Screening  You may have the following tests or measurements:  Height, weight, and BMI.  Blood pressure.  Lipid and cholesterol levels. These may be checked every 5 years, or more frequently if you are over 19 years old.  Skin check.  Lung cancer screening. You may have this screening every year starting at age 66 if you have a 30-pack-year history of smoking and currently smoke or have quit within the past 15 years.  Fecal occult blood test (FOBT) of the stool. You may have this test every year starting at age 66.  Flexible sigmoidoscopy or colonoscopy. You may have a sigmoidoscopy every 5 years or a colonoscopy every 10 years starting at age 66.  Prostate cancer screening. Recommendations will vary depending on your family history and other risks.  Hepatitis C blood test.  Hepatitis B blood test.  Sexually transmitted disease (STD) testing.  Diabetes screening. This is done by checking your blood sugar  (glucose) after you have not eaten for a while (fasting). You may have this done every 1-3 years.  Abdominal aortic aneurysm (AAA) screening. You may need this if you are a current or former smoker.  Osteoporosis. You may be screened starting at age 66 if you are at high risk. Talk with your health care provider about your test results, treatment options, and if necessary, the need for more tests. Vaccines  Your health care provider may recommend certain vaccines, such as:  Influenza vaccine. This is recommended every year.  Tetanus, diphtheria, and acellular pertussis (Tdap, Td) vaccine. You may need a Td booster every 10 years.  Zoster vaccine. You may need this after age 66.  Pneumococcal 13-valent conjugate (PCV13) vaccine. One dose is recommended after age 66.  Pneumococcal polysaccharide (PPSV23) vaccine. One dose is recommended after age 66. Talk to your health care provider about which screenings and vaccines you need and how often you need them. This information is not intended to replace advice given to you by your health care provider. Make sure you discuss any questions you have with your health care provider. Document Released: 08/18/2015 Document Revised: 04/10/2016 Document Reviewed: 05/23/2015 Elsevier Interactive Patient Education  2017 [place] Prevention in the Home Falls can cause injuries. They can happen to people of all ages. There are many things you can do to make your home safe and to help prevent falls. What can I do on the outside of my home?  Regularly fix the edges of walkways and driveways and fix any cracks.  Remove anything that might make you trip as you walk through a door, such as a raised step or threshold.  Trim any bushes or trees on the path to your home.  Use bright outdoor lighting.  Clear any walking paths of anything that might make someone trip, such as rocks or tools.  Regularly check to see if handrails are loose or  broken. Make sure that both sides of any steps have handrails.  Any raised decks and porches should have guardrails on the edges.  Have any leaves, snow, or ice cleared regularly.  Use sand or salt on walking paths during winter.  Clean up any spills in your garage right away. This includes oil or grease spills. What can I do in the bathroom?  Use night lights.  Install grab bars by the toilet and in the tub and shower. Do not use towel bars as grab bars.  Use non-skid mats or decals in the tub or shower.  If you need to sit down in the shower, use a plastic, non-slip stool.  Keep the floor dry. Clean up any water that spills on the floor as soon as it happens.  Remove soap buildup in the tub or shower regularly.  Attach bath mats securely with double-sided non-slip rug tape.  Do not have throw rugs and other things on the floor that can make you trip. What can I do in the bedroom?  Use night lights.  Make sure that you have a light by your bed that is easy to reach.  Do not use any sheets or blankets that are too big for your bed. They should not hang down onto the floor.  Have a firm chair that has side arms. You can use this for support while you  get dressed.  Do not have throw rugs and other things on the floor that can make you trip. What can I do in the kitchen?  Clean up any spills right away.  Avoid walking on wet floors.  Keep items that you use a lot in easy-to-reach places.  If you need to reach something above you, use a strong step stool that has a grab bar.  Keep electrical cords out of the way.  Do not use floor polish or wax that makes floors slippery. If you must use wax, use non-skid floor wax.  Do not have throw rugs and other things on the floor that can make you trip. What can I do with my stairs?  Do not leave any items on the stairs.  Make sure that there are handrails on both sides of the stairs and use them. Fix handrails that are  broken or loose. Make sure that handrails are as long as the stairways.  Check any carpeting to make sure that it is firmly attached to the stairs. Fix any carpet that is loose or worn.  Avoid having throw rugs at the top or bottom of the stairs. If you do have throw rugs, attach them to the floor with carpet tape.  Make sure that you have a light switch at the top of the stairs and the bottom of the stairs. If you do not have them, ask someone to add them for you. What else can I do to help prevent falls?  Wear shoes that:  Do not have high heels.  Have rubber bottoms.  Are comfortable and fit you well.  Are closed at the toe. Do not wear sandals.  If you use a stepladder:  Make sure that it is fully opened. Do not climb a closed stepladder.  Make sure that both sides of the stepladder are locked into place.  Ask someone to hold it for you, if possible.  Clearly mark and make sure that you can see:  Any grab bars or handrails.  First and last steps.  Where the edge of each step is.  Use tools that help you move around (mobility aids) if they are needed. These include:  Canes.  Walkers.  Scooters.  Crutches.  Turn on the lights when you go into a dark area. Replace any light bulbs as soon as they burn out.  Set up your furniture so you have a clear path. Avoid moving your furniture around.  If any of your floors are uneven, fix them.  If there are any pets around you, be aware of where they are.  Review your medicines with your doctor. Some medicines can make you feel dizzy. This can increase your chance of falling. Ask your doctor what other things that you can do to help prevent falls. This information is not intended to replace advice given to you by your health care provider. Make sure you discuss any questions you have with your health care provider. Document Released: 05/18/2009 Document Revised: 12/28/2015 Document Reviewed: 08/26/2014 Elsevier  Interactive Patient Education  2017 [employer].

## 2020-01-27 NOTE — Progress Notes (Signed)
I connected with Ronald Thompson today by telephone and verified that I am speaking with the correct person using two identifiers. Location patient: home Location provider: work Persons participating in the virtual visit: Eamonn Sermeno and Lisette Abu, LPN   I discussed the limitations, risks, security and privacy concerns of performing an evaluation and management service by telephone and the availability of in person appointments. I also discussed with the patient that there may be a patient responsible charge related to this service. The patient expressed understanding and verbally consented to this telephonic visit.    Interactive audio and video telecommunications were attempted between this provider and patient, however failed, due to patient having technical difficulties OR patient did not have access to video capability.  We continued and completed visit with audio only.  Some vital signs may be absent or patient reported.   Time Spent with patient on telephone encounter: 20 minutes  Subjective:   Ronald Thompson is a 64 y.o. male who presents for Medicare Annual/Subsequent preventive examination.  Review of Systems    No ROS. Medicare Wellness Virtual Visit. Additional risk factors are reflected in social history. Cardiac Risk Factors include: advanced age (>70men, >34 women);diabetes mellitus;dyslipidemia;hypertension;male gender     Objective:    Today's Vitals   01/27/20 0856  PainSc: 0-No pain   There is no height or weight on file to calculate BMI.  Advanced Directives 01/27/2020 01/19/2020 09/29/2019 09/29/2018 02/11/2018 12/26/2016 11/26/2016  Does Patient Have a Medical Advance Directive? Yes Yes Yes Yes Yes Yes Yes  Type of Advance Directive - [place];Living will [place];Living will [place];Living will Healthcare Power of [place];Living will -  Does patient want to make changes to  medical advance directive? No - Patient declined No - Patient declined No - Patient declined No - Patient declined - - No - Patient declined  Copy of [place] in Chart? No - copy requested No - copy requested No - copy requested No - copy requested No - copy requested No - copy requested -  Would patient like information on creating a medical advance directive? - - - - - - -  Pre-existing out of facility DNR order (yellow form or pink MOST form) - - - - - - -    Current Medications (verified) Outpatient Encounter Medications as of 01/27/2020  Medication Sig  . acetaminophen (TYLENOL) 500 MG tablet Take 1,000 mg by mouth every 6 (six) hours as needed for headache.  . albuterol (PROVENTIL HFA;VENTOLIN HFA) 108 (90 Base) MCG/ACT inhaler Inhale 1-2 puffs into the lungs every 6 (six) hours as needed for wheezing or shortness of breath. (Patient not taking: Reported on 11/24/2019)  . Blood Pressure Monitoring (BLOOD PRESSURE CUFF) MISC Use as directed  . calcium carbonate (TUMS - DOSED IN MG ELEMENTAL CALCIUM) 500 MG chewable tablet Chew 3 tablets by mouth 3 (three) times daily as needed for indigestion or heartburn.   . clindamycin (CLEOCIN) 300 MG capsule Take 2 capsules by mouth one hour prior to dental appointment (Patient not taking: Reported on 06/09/2019)  . cyclobenzaprine (FLEXERIL) 5 MG tablet Take 1 tablet (5 mg total) by mouth 3 (three) times daily as needed for muscle spasms. (Patient not taking: Reported on 06/09/2019)  . docusate sodium (COLACE) 100 MG capsule Take 100 mg by mouth daily as needed for mild constipation.  Marland Kitchen glucose blood (ONETOUCH VERIO) test strip Use as instructed to  test blood sugar one time daily E11.65  . ibuprofen (ADVIL,MOTRIN) 200 MG tablet Take 400 mg by mouth every 6 (six) hours as needed for headache or mild pain.   Elmore Guise Device MISC Use as instructed to test blood sugar daily E11.65  . losartan-hydrochlorothiazide (HYZAAR) 100-25 MG tablet  TAKE 1 TABLET BY MOUTH DAILY  . metFORMIN (GLUCOPHAGE-XR) 500 MG 24 hr tablet Take 1 tablet (500 mg total) by mouth daily with breakfast.  . Octreotide Acetate (SANDOSTATIN IJ) Inject 1 each as directed every 30 (thirty) days.   . ondansetron (ZOFRAN) 8 MG tablet Take 1 tablet (8 mg total) by mouth 2 (two) times daily as needed for nausea or vomiting. (Patient not taking: Reported on 11/24/2019)  . ondansetron (ZOFRAN) 8 MG tablet Take 1 tablet (8 mg total) by mouth 2 (two) times daily as needed for nausea or vomiting. (Patient not taking: Reported on 01/19/2020)  . OneTouch Delica Lancets 07H MISC Use as instructed to test blood sugar once daily E11.65  . sildenafil (VIAGRA) 100 MG tablet TAKE ONE-HALF TO ONE TABLET BY MOUTH DAILY AS NEEDED FOR ERECTILE DYSFUNCTION  . simvastatin (ZOCOR) 20 MG tablet TAKE ONE TABLET BY MOUTH DAILY AT 6PM   Facility-Administered Encounter Medications as of 01/27/2020  Medication  . heparin lock flush 100 unit/mL  . octreotide (SANDOSTATIN LAR) 30 MG IM injection  . sodium chloride flush (NS) 0.9 % injection 10 mL    Allergies (verified) Penicillins   History: Past Medical History:  Diagnosis Date  . Baker's cyst   . Gallstones   . Heart murmur   . Hypercholesteremia   . Hypertension   . Lesion of left lung    hx.25 yrs ago- testicilar cancer related- only scarring left after tx.-no problems now  . Liver metastasis ([place])   . 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  . Pericarditis    2nd to tumor  . Pneumonia 1990  . Primary neuroendocrine tumor of pancreas 02/14/2014   Stage IV with multiple mets to liver  . Pulmonary fibrosis ([place]) 11/08/2015  . S/P TAVR (transcatheter aortic valve replacement) 04/16/2016   26 mm Edwards Sapien 3 transcatheter heart valve placed via percutaneous right transfemoral approach   . Shortness of breath dyspnea    with exertion and when  tired  . Testicular seminoma ([place]) 1990   with metatstatic spread - good response to therapy-radiation and chemotherapy  . Transfusion history    hx. 25 yrs ago-during cancer tx.   Past Surgical History:  Procedure Laterality Date  . BACK SURGERY     '14- rupt. disc   . CARDIAC CATHETERIZATION N/A 01/24/2016   Procedure: Right/Left Heart Cath and Coronary Angiography;  Surgeon: [name], MD;  Location: [facility];  Service: Cardiovascular;  Laterality: N/A;  . COLONOSCOPY    . EUS N/A 02/10/2014   Procedure: UPPER ENDOSCOPIC ULTRASOUND (EUS) LINEAR;  Surgeon: [name], MD;  Location: [facility];  Service: Endoscopy;  Laterality: N/A;  . IR RADIOLOGIST EVAL & MGMT  02/11/2017  . KNEE SURGERY Right    age 74 for Baker's cyst  . NASAL SEPTUM SURGERY     Deviated septrum  . TEE WITHOUT CARDIOVERSION N/A 04/16/2016   Procedure: TRANSESOPHAGEAL ECHOCARDIOGRAM (TEE);  Surgeon: [name], MD;  Location: [facility];  Service: Open Heart Surgery;  Laterality: N/A;  . THORACOTOMY  1990  wedge biopsy of mediastinal mass  . TRANSCATHETER AORTIC VALVE REPLACEMENT, TRANSFEMORAL N/A 04/16/2016   Procedure: TRANSCATHETER AORTIC VALVE REPLACEMENT, TRANSFEMORAL;  Surgeon: [name], MD;  Location: [facility];  Service: Open Heart Surgery;  Laterality: N/A;   Family History  Problem Relation Age of Onset  . Dementia Mother   . Cancer Paternal Grandfather        esophagus  . Cancer Maternal Aunt        colon  . Emphysema Maternal Aunt    Social History   Socioeconomic History  . Marital status: Married    Spouse name: Not on file  . Number of children: 3  . Years of education: 80  . Highest education level: Not on file  Occupational History  . Occupation: Administrator, sports: [employer]  Tobacco Use  . Smoking status: Never Smoker  . Smokeless tobacco: Never Used  Vaping Use  . Vaping Use: Never used  Substance and Sexual Activity  . Alcohol use: Yes    Comment:  rare- social  . Drug use: No  . Sexual activity: Yes    Partners: Female  Other Topics Concern  . Not on file  Social History Narrative   [name]. 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 2 sons: 1979, 1985- married with 2 daughters 1 in route: Oldest in [place], younger [place], [place]. 1 daughter: 54 [place], married-1 granddaughter.Customer service manager- Nurse, mental health. SO- good health; marriage in good health   Enjoys watching movies, works out at home gym   Social Determinants of Radio broadcast assistant Strain:   . Difficulty of Paying Living Expenses:   Food Insecurity:   . Worried About Charity fundraiser in the Last Year:   . Arboriculturist in the Last Year:   Transportation Needs:   . Film/video editor (Medical):   Marland Kitchen Lack of Transportation (Non-Medical):   Physical Activity:   . Days of Exercise per Week:   . Minutes of Exercise per Session:   Stress:   . Feeling of Stress :   Social Connections:   . Frequency of Communication with Friends and Family:   . Frequency of Social Gatherings with Friends and Family:   . Attends Religious Services:   . Active Member of Clubs or Organizations:   . Attends Archivist Meetings:   Marland Kitchen Marital Status:     Tobacco Counseling Counseling given: Not Answered   Clinical Intake:  Pre-visit preparation completed: Yes  Pain : No/denies pain Pain Score: 0-No pain     Nutritional Risks: None Diabetes: Yes CBG done?: No Did pt. bring in CBG monitor from home?: No  What is the last grade level you completed in school?: Bachelor's Degree in Business  Diabetic? Yes  Interpreter Needed?: No  Information entered by :: Khrystal Jeanmarie N. Khrystal Jeanmarie, LPN   Activities of Daily Living In your present state of health, do you have any difficulty performing the following activities: 01/27/2020  Hearing? N  Vision? N  Difficulty concentrating or making decisions? N  Walking or climbing stairs? N  Dressing or bathing? N  Doing errands, shopping? N    Preparing Food and eating ? N  Using the Toilet? N  In the past six months, have you accidently leaked urine? N  Do you have problems with loss of bowel control? N  Managing your Medications? N  Managing your Finances? N  Some recent data might be hidden    Patient Care Team: Cathlean Cower  W, MD as PCP - General (Internal Medicine) Ladell Pier, MD as Consulting Physician (Oncology) [name], MD as Consulting Physician (Cardiology) Deneise Lever, MD as Consulting Physician (Pulmonary Disease)  Indicate any recent Medical Services you may have received from other than [facility] providers in the past year (date may be approximate).     Assessment:   This is a routine wellness examination for Ronald Thompson.  Hearing/Vision screen No exam data present  Dietary issues and exercise activities discussed: Current Exercise Habits: Home exercise routine, Type of exercise: strength training/weights, Time (Minutes): 60, Frequency (Times/Week): 5, Weekly Exercise (Minutes/Week): 300, Intensity: Moderate, Exercise limited by: None identified  Goals    . Patient Stated     I want to maintain a steady routine, continue to be as healthy and as independent as possible. Enjoy life and family.      Depression Screen PHQ 2/9 Scores 01/27/2020 09/22/2019 09/22/2019 08/18/2018 06/18/2018 02/11/2018 03/24/2017  PHQ - 2 Score 0 0 0 0 0 1 0  PHQ- 9 Score - - - - - 5 -    Fall Risk Fall Risk  01/27/2020 09/22/2019 09/22/2019 08/18/2018 06/18/2018  Falls in the past year? 0 0 1 0 0  Number falls in past yr: 0 - - - -  Injury with Fall? 0 - 1 - -  Risk for fall due to : No Fall Risks - - - -  Follow up Falls evaluation completed - - - -    Any stairs in or around the home? Yes  If so, are there any without handrails? No  Home free of loose throw rugs in walkways, pet beds, electrical cords, etc? Yes  Adequate lighting in your home to reduce risk of falls? Yes   ASSISTIVE DEVICES UTILIZED TO PREVENT  FALLS:  Life alert? No  Use of a cane, walker or w/c? No  Grab bars in the bathroom? Yes  Shower chair or bench in shower? No  Elevated toilet seat or a handicapped toilet? No   TIMED UP AND GO:  Was the test performed? No .  Length of time to ambulate 10 feet: 0 sec.   Gait steady and fast without use of assistive device (per patient)  Cognitive Function:     6CIT Screen 01/27/2020  What Year? 0 points  What month? 0 points  What time? 0 points  Count back from 20 0 points  Months in reverse 0 points  Repeat phrase 0 points  Total Score 0    Immunizations Immunization History  Administered Date(s) Administered  . Fluad Quad(high Dose 65+) 06/09/2019  . Influenza Whole 06/09/2010, 04/26/2012  . Influenza,inj,Quad PF,6+ Mos 04/13/2014, 04/26/2016, 05/14/2017  . Influenza-Unspecified 05/30/2015, 04/05/2018  . PFIZER SARS-COV-2 Vaccination 10/20/2019, 11/10/2019  . Pneumococcal Conjugate-13 09/22/2019  . Td 06/09/2010  . Tdap 11/18/2011  . Zoster Recombinat (Shingrix) 03/05/2018, 05/07/2018, 07/18/2018    TDAP status: Up to date Flu Vaccine status: Up to date Pneumococcal vaccine status: Up to date Covid-19 vaccine status: Completed vaccines  Qualifies for Shingles Vaccine? Yes   Zostavax completed No   Shingrix Completed?: Yes  Screening Tests Health Maintenance  Topic Date Due  . FOOT EXAM  Never done  . OPHTHALMOLOGY EXAM  Never done  . INFLUENZA VACCINE  03/05/2020  . HEMOGLOBIN A1C  07/18/2020  . PNA vac Low Risk Adult (2 of 2 - PPSV23) 09/21/2020  . TETANUS/TDAP  11/17/2021  . COLONOSCOPY  08/05/2024  . COVID-19 Vaccine  Completed  .  Hepatitis C Screening  Completed    Health Maintenance  Health Maintenance Due  Topic Date Due  . FOOT EXAM  Never done  . OPHTHALMOLOGY EXAM  Never done    Colorectal cancer screening: Completed 08/05/2014. Repeat every 10 years  Lung Cancer Screening: (Low Dose CT Chest recommended if Age 37-80 years, 30  pack-year currently smoking OR have quit w/in 15years.) does not qualify.   Lung Cancer Screening Referral: No  Additional Screening:  Hepatitis C Screening: does not qualify; Completed Yes  Vision Screening: Recommended annual ophthalmology exams for early detection of glaucoma and other disorders of the eye. Is the patient up to date with their annual eye exam?  Yes  Who is the provider or what is the name of the office in which the patient attends annual eye exams? Rutherford Guys, MD If pt is not established with a provider, would they like to be referred to a provider to establish care? No .   Dental Screening: Recommended annual dental exams for proper oral hygiene  Community Resource Referral / Chronic Care Management: CRR required this visit?  No   CCM required this visit?  No      Plan:     I have personally reviewed and noted the following in the patient's chart:   . Medical and social history . Use of alcohol, tobacco or illicit drugs  . Current medications and supplements . Functional ability and status . Nutritional status . Physical activity . Advanced directives . List of other physicians . Hospitalizations, surgeries, and ER visits in previous 12 months . Vitals . Screenings to include cognitive, depression, and falls . Referrals and appointments  In addition, I have reviewed and discussed with patient certain preventive protocols, quality metrics, and best practice recommendations. A written personalized care plan for preventive services as well as general preventive health recommendations were provided to patient.     Sheral Flow, LPN   [id number]/5320   Nurse Notes:  There were no vitals filed for this visit. There is no height or weight on file to calculate BMI.

## 2020-02-15 ENCOUNTER — Other Ambulatory Visit: Payer: Self-pay

## 2020-02-15 ENCOUNTER — Ambulatory Visit (HOSPITAL_COMMUNITY)
Admission: RE | Admit: 2020-02-15 | Discharge: 2020-02-15 | Disposition: A | Discharge status: Home / Self Care | Payer: Medicare Other | Source: Ambulatory Visit | Attending: Oncology | Admitting: Oncology

## 2020-02-15 DIAGNOSIS — C787 Secondary malignant neoplasm of liver and intrahepatic bile duct: Secondary | ICD-10-CM | POA: Diagnosis not present

## 2020-02-15 DIAGNOSIS — C7A1 Malignant poorly differentiated neuroendocrine tumors: Secondary | ICD-10-CM | POA: Diagnosis not present

## 2020-02-15 DIAGNOSIS — C7A8 Other malignant neuroendocrine tumors: Secondary | ICD-10-CM | POA: Insufficient documentation

## 2020-02-15 MED ORDER — GALLIUM GA 68 DOTATATE IV KIT: 4.5000 | PACK | Freq: Once | INTRAVENOUS | Status: DC | PRN

## 2020-02-15 NOTE — Consult Note (Signed)
Chief Complaint: Patient with metastatic neuroendocrine tumor post completion of 2 cycles Peptide receptor radiotherapy (PRRT) RETREATMENT with Lu177 DOTATATE (Lutathera).  Referring Physician(s):Sherrill   Patient Status: [facility] - Out-pt  History of Present Illness: Ronald Thompson is a 64 y.o. male Well differentiated neuroendocrine tumor with liver metastasis. Patient status post 2 cycles of peptide receptor radiotherapy re-treatment. Last re-treatment treatment 12/22/2019. Initial therapy 11/03/2016.   Past Medical History:  Diagnosis Date  . Baker's cyst   . Gallstones   . Heart murmur   . Hypercholesteremia   . Hypertension   . Lesion of left lung    hx.25 yrs ago- testicilar cancer related- only scarring left after tx.-no problems now  . Liver metastasis ([place])   . 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  . Pericarditis    2nd to tumor  . Pneumonia 1990  . Primary neuroendocrine tumor of pancreas 02/14/2014   Stage IV with multiple mets to liver  . Pulmonary fibrosis ([place]) 11/08/2015  . S/P TAVR (transcatheter aortic valve replacement) 04/16/2016   26 mm Edwards Sapien 3 transcatheter heart valve placed via percutaneous right transfemoral approach   . Shortness of breath dyspnea    with exertion and when tired  . 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 Transfusion history    hx. 25 yrs ago-during cancer tx.    Past Surgical History:  Procedure Laterality Date  . BACK SURGERY     '14- rupt. disc   . CARDIAC CATHETERIZATION N/A 01/24/2016   Procedure: Right/Left Heart Cath and Coronary Angiography;  Surgeon: [name], MD;  Location: [place];  Service: Cardiovascular;  Laterality: N/A;  . COLONOSCOPY    . EUS N/A 02/10/2014   Procedure: UPPER ENDOSCOPIC ULTRASOUND (EUS) LINEAR;  Surgeon: [name], MD;  Location: [facility];  Service: Endoscopy;  Laterality: N/A;  . IR RADIOLOGIST EVAL & MGMT  02/11/2017  . KNEE SURGERY Right    age 33 for Baker's cyst  . NASAL SEPTUM SURGERY     Deviated septrum  . TEE WITHOUT CARDIOVERSION N/A 04/16/2016   Procedure: TRANSESOPHAGEAL ECHOCARDIOGRAM (TEE);  Surgeon: [name], MD;  Location: [facility];  Service: Open Heart Surgery;  Laterality: N/A;  . THORACOTOMY  1990   wedge biopsy of mediastinal mass  . TRANSCATHETER AORTIC VALVE REPLACEMENT, TRANSFEMORAL N/A 04/16/2016   Procedure: TRANSCATHETER AORTIC VALVE REPLACEMENT, TRANSFEMORAL;  Surgeon: [name], MD;  Location: [facility];  Service: Open Heart Surgery;  Laterality: N/A;    Allergies: Penicillins  Medications: Prior to Admission medications   Medication Sig Start Date End Date Taking? Authorizing Provider  acetaminophen (TYLENOL) 500 MG tablet Take 1,000 mg by mouth every 6 (six) hours as needed for headache.    [provider]  albuterol (PROVENTIL HFA;VENTOLIN HFA) 108 (90 Base) MCG/ACT inhaler Inhale 1-2 puffs into the lungs every 6 (six) hours as needed for wheezing or shortness of breath. Patient not taking: Reported on 11/24/2019 03/24/17   Flossie Buffy, NP  Blood Pressure Monitoring (BLOOD PRESSURE CUFF) MISC Use as directed 05/25/18   Marrian Salvage, FNP  calcium carbonate (TUMS - DOSED IN MG ELEMENTAL CALCIUM) 500 MG chewable tablet Chew 3 tablets by mouth 3 (three) times daily as needed for indigestion or heartburn.     [provider]  clindamycin (  CLEOCIN) 300 MG capsule Take 2 capsules by mouth one hour prior to dental appointment Patient not taking: Reported on 06/09/2019 05/13/16   [name], MD  cyclobenzaprine (FLEXERIL) 5 MG tablet Take 1 tablet (5 mg total) by mouth 3 (three) times daily as needed for muscle spasms. Patient not taking: Reported on 06/09/2019 03/29/19   [name], MD  docusate sodium (COLACE) 100 MG capsule  Take 100 mg by mouth daily as needed for mild constipation.    [provider]  glucose blood (ONETOUCH VERIO) test strip Use as instructed to test blood sugar one time daily E11.65 10/11/19   Shamleffer, Melanie Crazier, MD  ibuprofen (ADVIL,MOTRIN) 200 MG tablet Take 400 mg by mouth every 6 (six) hours as needed for headache or mild pain.     [provider]  Lancet Device MISC Use as instructed to test blood sugar daily E11.65 10/13/19   Shamleffer, Melanie Crazier, MD  losartan-hydrochlorothiazide [facility]) 100-25 MG tablet TAKE 1 TABLET BY MOUTH DAILY 11/09/19   [name], MD  metFORMIN (GLUCOPHAGE-XR) 500 MG 24 hr tablet Take 1 tablet (500 mg total) by mouth daily with breakfast. 01/17/20   Shamleffer, Melanie Crazier, MD  Octreotide Acetate (SANDOSTATIN IJ) Inject 1 each as directed every 30 (thirty) days.     [provider]  ondansetron (ZOFRAN) 8 MG tablet Take 1 tablet (8 mg total) by mouth 2 (two) times daily as needed for nausea or vomiting. Patient not taking: Reported on 11/24/2019 10/27/19   Gus Height, MD  ondansetron (ZOFRAN) 8 MG tablet Take 1 tablet (8 mg total) by mouth 2 (two) times daily as needed for nausea or vomiting. Patient not taking: Reported on 01/19/2020 12/22/19   Kerby Moors, MD  OneTouch Delica Lancets 84Z MISC Use as instructed to test blood sugar once daily E11.65 10/11/19   Shamleffer, Melanie Crazier, MD  sildenafil (VIAGRA) 100 MG tablet TAKE ONE-HALF TO ONE TABLET BY MOUTH DAILY AS NEEDED FOR ERECTILE DYSFUNCTION 02/22/19   [name], MD  simvastatin (ZOCOR) 20 MG tablet TAKE ONE TABLET BY MOUTH DAILY AT 6PM 02/16/19   [name], MD     Family History  Problem Relation Age of Onset  . Dementia Mother   . Cancer Paternal Grandfather        esophagus  . Cancer Maternal Aunt        colon  . Emphysema Maternal Aunt     Social History   Socioeconomic History  . Marital status: Married    Spouse name: Not on file  . Number  of children: 3  . Years of education: 65  . Highest education level: Not on file  Occupational History  . Occupation: Administrator, sports: [employer]  Tobacco Use  . Smoking status: Never Smoker  . Smokeless tobacco: Never Used  Vaping Use  . Vaping Use: Never used  Substance and Sexual Activity  . Alcohol use: Yes    Comment: rare- social  . Drug use: No  . Sexual activity: Yes    Partners: Female  Other Topics Concern  . Not on file  Social History Narrative   [name]. 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 2 sons: 1979, 1985- married with 2 daughters 1 in route: Oldest in [place], younger [place], [place]. 1 daughter: 5 [place], married-1 granddaughter.Customer service manager- Nurse, mental health. SO- good health; marriage in good health   Enjoys watching movies, works out at home gym   Social Determinants of Health  Financial Resource Strain:   . Difficulty of Paying Living Expenses:   Food Insecurity:   . Worried About Charity fundraiser in the Last Year:   . Arboriculturist in the Last Year:   Transportation Needs:   . Film/video editor (Medical):   Marland Kitchen Lack of Transportation (Non-Medical):   Physical Activity:   . Days of Exercise per Week:   . Minutes of Exercise per Session:   Stress:   . Feeling of Stress :   Social Connections:   . Frequency of Communication with Friends and Family:   . Frequency of Social Gatherings with Friends and Family:   . Attends Religious Services:   . Active Member of Clubs or Organizations:   . Attends Archivist Meetings:   Marland Kitchen Marital Status:     ECOG Status: 0 - Asymptomatic  Review of Systems: A 12 point ROS discussed and pertinent positives are indicated in the HPI above.  All other systems are negative.  Review of Systems  Negative. New onset Type II diabetes. Controlled with diet and low dose metformim  Vital Signs: There were no vitals taken for this visit.  Physical Exam  Deferred for Covid.  Pateint appears well.   Imaging: NM  PET (NETSPOT GA 5 DOTATATE) SKULL BASE TO MID THIGH  Result Date: 02/15/2020 CLINICAL DATA:  Well differentiated neuroendocrine tumor with liver metastasis. Patient status post 2 cycles of peptide receptor radiotherapy re-treatment. Last re-treatment treatment 12/22/2019. Initial therapy 11/03/2016. EXAM: NUCLEAR MEDICINE PET SKULL BASE TO THIGH TECHNIQUE: 4.2 mCi Ga 6 DOTATATE was injected intravenously. Full-ring PET imaging was performed from the skull base to thigh after the radiotracer. CT data was obtained and used for attenuation correction and anatomic localization. COMPARISON:  DOTATATE PET scan 09/24/2019 FINDINGS: NECK No radiotracer activity in neck lymph nodes. Incidental CT findings: None CHEST No radiotracer accumulation within mediastinal or hilar lymph nodes. No suspicious pulmonary nodules on the CT scan. Incidental CT finding:None ABDOMEN/PELVIS Again demonstrated multifocal hepatic metastasis with radiotracer activity. There is however clear interval decrease in the radiotracer activity following peptide receptor radiotherapy. Example lesions include: Dominant lesion LEFT hepatic lobe measures 6.5 x 7.8 cm compared with 7.3 x 8.1 cm on prior. Lesion is decreased radiotracer activity with SUV max equal 14.3 compared SUV max equal 22. Lesion along the falciform ligament measures 2.8 cm compared with 3.1 cm. Lesion is decreased radiotracer activity with SUV max equal 20.4 compared SUV max equal 22 Lesion posterior RIGHT hepatic lobe measuring 5.5 cm compares to 5.0 cm (image 109/4) and has decreased radiotracer activity with SUV max equal 7.8 compared SUV max equal 10.4. Finally, in the lateral RIGHT hepatic lobe, dramatic visual reduction in radiotracer activity with SUV max equal 10.8 compared SUV max equal 15.0. Lesion measures approximately 5 cm (image 124/4) compared with 5.5 cm. No evidence metastatic adenopathy within the mesentery. No abnormal activity within bowel or pancreas.  Physiologic activity noted in the liver, spleen, adrenal glands and kidneys. Incidental CT findings:None SKELETON Focal radiotracer activity in the LEFT iliac bone with SUV max equal 2.6 compared to SUV max equal 3.7. Small sclerotic lesion at this level (image 163/4) is unchanged. Incidental CT findings:None IMPRESSION: 1. Measurable decrease in radiotracer activity both visually and by direct measurement within the metastatic lesions within liver. No new lesions identified. 2. Additionally, above lesions in both hepatic lobe are decreased in size compared to most recent DOTATATE PET scan 09/24/2019. 3. No new or  progressive disease in the bowel or mesentery. 4. Decreased activity of isolated lesion in the LEFT iliac bone. Electronically Signed   By: Suzy Bouchard M.D.   On: 02/15/2020 13:17   NM PET (NETSPOT GA 73 DOTATATE) SKULL BASE TO MID THIGH  Result Date: 09/24/2019 CLINICAL DATA:  Well differentiated neuroendocrine tumor. Patient status post peptide receptor radiotherapy. Patient status peptide receptor radiotherapy (4 cycles of Lu 177 - DOTATATE (Lutathera) . Last Lutathera therapy 08/27/2017. EXAM: NUCLEAR MEDICINE PET SKULL BASE TO THIGH TECHNIQUE: 4.8 mCi Ga 85 DOTATATE was injected intravenously. Full-ring PET imaging was performed from the skull base to thigh after the radiotracer. CT data was obtained and used for attenuation correction and anatomic localization. COMPARISON:  PET-CT 01/29/2019, 05/20/2018, 03/24/2017 FINDINGS: NECK No radiotracer activity in neck lymph nodes. Incidental CT findings: None CHEST No radiotracer accumulation within mediastinal or hilar lymph nodes. No suspicious pulmonary nodules on the CT scan. Incidental CT finding:None ABDOMEN/PELVIS Again demonstrated lesions within liver which accumulate have neuroendocrine tumor specific radiotracer. Dominant lesion in the lateral LEFT hepatic lobe has increased in size significantly measuring 7.3 x 8.1 cm compared to 4.9 x  5.1 cm on PET-CT 01/29/2019. Lesion has peripheral radiotracer activity with SUV max equal 22 compared to central radiotracer activity on comparison exam with SUV max equal 25. Enlarged lesion along the falciform ligament (image 123) measures 3.1 cm compared to 1 point 2 cm (image 122/series 4). This lesion also has intense radiotracer activity. Less well-defined lesion in the RIGHT hepatic lobe measures 4.4 x 4.4 cm (image 106/4) compared to 2.4 by 1.6 cm. Lesion has radiotracer activity SUV max equal 10.4 compared SUV max equal 10.4. Photopenia in lesion in the more lateral RIGHT hepatic lobe consistent with an active treated metastasis. No abnormal radiotracer within abdominopelvic lymph nodes. No abnormal radiotracer activity associated with the bowel. No evidence of bowel obstruction Physiologic activity noted in the liver, spleen, adrenal glands and kidneys. Incidental CT findings: SKELETON Mild activity associated with the LEFT iliac wing iliac bone along the SI joint with SUV max equal 3.6 compared SUV max equal 3.8. No apparent change in size of this small sclerotic lesion (image 178/4No focal activity to suggest skeletal metastasis. Incidental CT findings:None IMPRESSION: 1. Clear interval increase in size of three lesions within the liver which have associated neuroendocrine tumor specific radiotracer activity. Radiotracer activity within these neuro endocrine tumors remain stable compared to most recent DOTATATE PET scan and remains relatively intense 2. No evidence of new lesions in the chest, abdomen, or pelvis. No mesenteric or bowel lesions. 3. Stable small lesion in the LEFT ilium with radiotracer activity. 4. With an initial positive response to peptide receptor therapy and subsequent recurrence, patient may be a good candidate for retreatment with LU 177 DOTATATE (2 years since prior therapy.) Consider medicine consultation. Electronically Signed   By: Suzy Bouchard M.D.   On: 09/24/2019 14:35      Labs:  CBC: Recent Labs    10/27/19 0835 11/24/19 1116 12/22/19 0825 01/19/20 1108  WBC 4.4 4.2 4.0 4.5  HGB 14.9 13.9 14.6 13.7  HCT 45.2 41.4 42.8 39.2  PLT 143* 102* 154 103*    COAGS: No results for input(s): INR, APTT in the last 8760 hours.  BMP: Recent Labs    10/27/19 0835 11/24/19 1116 12/22/19 0825 01/19/20 1108  NA 137 139 138 140  K 4.2 4.3 4.2 4.2  CL 100 100 98 100  CO2 30 31 29  31  GLUCOSE 106* 104* 106* 95  BUN 28* 23 25* 23  CALCIUM 9.6 9.5 9.6 9.7  CREATININE 1.11 1.17 1.10 1.25*  GFRNONAA >60 >60 >60 60*  GFRAA >60 >60 >60 >60    LIVER FUNCTION TESTS: Recent Labs    10/27/19 0835 11/24/19 1116 12/22/19 0825 01/19/20 1108  BILITOT 1.3* 1.2 1.1 1.4*  AST 31 27 28 29   ALT 28 19 23 22   ALKPHOS 55 58 43 53  PROT 7.3 6.9 6.3* 7.0  ALBUMIN 4.6 4.2 4.2 4.3    TUMOR MARKERS: No results for input(s): AFPTM, CEA, CA199, CHROMOGRNA in the last 8760 hours.  Assessment and Plan:  1. Patient status post 2 cycles of peptide receptor radiotherapy RETREATMENT with 200 microcuries Lu-177 DOTATATE. Last re-treatment therapy 12/22/2019.  Initial 4 cycles of peptide receptor therapy completed November 03, 2016.  2. Patient had evidence of progression hepatic metastasis on DOTATATE scan from February 2021.  Current follow-up DOTATATE scan (02/15/2020) demonstrates measurable positive response to peptide receptor radiotherapy re-treatment with decrease in size and radiotracer activity of the hepatic metastasis as well as the isolated bone lesion.  No new or progressive disease.  3. Patient experienced minimal adverse effects with some recent decrease in platelets and renal efficiency.  These were presumably return to baseline.  Recommend follow-up laboratory monitoring.  4. Recommend continued Sandostatin injections and following Chromogranin A. 5. Patient appears well and in good spirits.  Minimal if any symptomology.    Thank you for this interesting  consult.  I greatly enjoyed meeting Aristeo Hankerson and look forward to participating in their care.  A copy of this report was sent to the requesting provider on this date.  Electronically Signed: Rennis Golden, MD 02/15/2020, 2:21 PM   I spent a total of    10 Minutes in face to face in clinical consultation, greater than 50% of which was counseling/coordinating care for metastatic neuroendocrine tumor.

## 2020-02-16 ENCOUNTER — Inpatient Hospital Stay: Payer: Medicare Other | Attending: Oncology | Admitting: Oncology

## 2020-02-16 ENCOUNTER — Telehealth: Payer: Self-pay | Admitting: Oncology

## 2020-02-16 ENCOUNTER — Inpatient Hospital Stay: Payer: Medicare Other

## 2020-02-16 ENCOUNTER — Other Ambulatory Visit: Payer: Self-pay

## 2020-02-16 VITALS — BP 119/71 | HR 75 | Temp 97.9°F | Resp 18 | Ht 70.0 in | Wt 172.5 lb

## 2020-02-16 DIAGNOSIS — D3A8 Other benign neuroendocrine tumors: Secondary | ICD-10-CM

## 2020-02-16 DIAGNOSIS — C7B8 Other secondary neuroendocrine tumors: Secondary | ICD-10-CM | POA: Insufficient documentation

## 2020-02-16 DIAGNOSIS — C7A8 Other malignant neuroendocrine tumors: Secondary | ICD-10-CM | POA: Insufficient documentation

## 2020-02-16 LAB — CBC WITH DIFFERENTIAL (CANCER CENTER ONLY)
Abs Immature Granulocytes: 0.01 10*3/uL (ref 0.00–0.07)
Basophils Absolute: 0 10*3/uL (ref 0.0–0.1)
Basophils Relative: 1 %
Eosinophils Absolute: 0.1 10*3/uL (ref 0.0–0.5)
Eosinophils Relative: 1 %
HCT: 39.5 % (ref 39.0–52.0)
Hemoglobin: 13.3 g/dL (ref 13.0–17.0)
Immature Granulocytes: 0 %
Lymphocytes Relative: 5 %
Lymphs Abs: 0.2 10*3/uL — ABNORMAL LOW (ref 0.7–4.0)
MCH: 33.3 pg (ref 26.0–34.0)
MCHC: 33.7 g/dL (ref 30.0–36.0)
MCV: 98.8 fL (ref 80.0–100.0)
Monocytes Absolute: 0.6 10*3/uL (ref 0.1–1.0)
Monocytes Relative: 13 %
Neutro Abs: 3.3 10*3/uL (ref 1.7–7.7)
Neutrophils Relative %: 80 %
Platelet Count: 115 10*3/uL — ABNORMAL LOW (ref 150–400)
RBC: 4 MIL/uL — ABNORMAL LOW (ref 4.22–5.81)
RDW: 12.7 % (ref 11.5–15.5)
WBC Count: 4.2 10*3/uL (ref 4.0–10.5)
nRBC: 0 % (ref 0.0–0.2)

## 2020-02-16 MED ORDER — OCTREOTIDE ACETATE 30 MG IM KIT
30.0000 mg | PACK | Freq: Once | INTRAMUSCULAR | Status: AC
  Administered 2020-02-16: 30 mg via INTRAMUSCULAR

## 2020-02-16 MED ORDER — OCTREOTIDE ACETATE 30 MG IM KIT
PACK | INTRAMUSCULAR | Status: AC
  Filled 2020-02-16: qty 1

## 2020-02-16 NOTE — Progress Notes (Signed)
[facility] OFFICE PROGRESS NOTE   Diagnosis: Neuroendocrine tumor  INTERVAL HISTORY:   Ronald Thompson returns as scheduled.  He continues monthly Sandostatin.  Stable exertional dyspnea.  Good appetite.  He relates weight loss to changing to a diabetic diet.  Objective:  Vital signs in last 24 hours:  Blood pressure 119/71, pulse 75, temperature 97.9 F (36.6 C), temperature source Tympanic, resp. rate 18, height 5\' 10"  (1.778 m), weight 172 lb 8 oz (78.2 kg), SpO2 97 %.     Resp: Decreased breath sounds at the left compared to the right chest, no respiratory distress Cardio: Regular rate and rhythm GI: No hepatomegaly, nontender Vascular: No leg edema   Lab Results:  Lab Results  Component Value Date   WBC 4.2 02/16/2020   HGB 13.3 02/16/2020   HCT 39.5 02/16/2020   MCV 98.8 02/16/2020   PLT 115 (L) 02/16/2020   NEUTROABS 3.3 02/16/2020    CMP  Lab Results  Component Value Date   NA 140 01/19/2020   K 4.2 01/19/2020   CL 100 01/19/2020   CO2 31 01/19/2020   GLUCOSE 95 01/19/2020   BUN 23 01/19/2020   CREATININE 1.25 (H) 01/19/2020   CALCIUM 9.7 01/19/2020   PROT 7.0 01/19/2020   ALBUMIN 4.3 01/19/2020   AST 29 01/19/2020   ALT 22 01/19/2020   ALKPHOS 53 01/19/2020   BILITOT 1.4 (H) 01/19/2020   GFRNONAA 60 (L) 01/19/2020   GFRAA >60 01/19/2020      Imaging:  NM Radiologist Eval And Mgmt  Result Date: 02/15/2020 EXAM: ESTABLISHED PATIENT OFFICE VISIT CHIEF COMPLAINT: Well differentiated neuroendocrine tumor with liver metastasis. Patient status post 2 cycles of peptide receptor radiotherapy re-treatment. Last re-treatment treatment 12/22/2019. Initial therapy 11/03/2016. Current Pain Level: 1-10 HISTORY OF PRESENT ILLNESS: See epic note REVIEW OF SYSTEMS: See epic note PHYSICAL EXAMINATION: See epic note ASSESSMENT AND PLAN: 1. Patient status post 2 cycles of peptide receptor radiotherapy RETREATMENT with 200 microcuries Lu-177 DOTATATE. Last  re-treatment therapy 12/22/2019. Initial 4 cycles of peptide receptor therapy completed November 03, 2016. 2. Patient had evidence of progression hepatic metastasis on DOTATATE scan from February 2021. Current follow-up DOTATATE scan (02/15/2020) demonstrates measurable positive response to peptide receptor radiotherapy re-treatment with decrease in size and radiotracer activity of the hepatic metastasis as well as the isolated bone lesion. No new or progressive disease. 3. Patient experienced minimal adverse effects with some recent decrease in platelets and renal efficiency. These were presumably return to baseline. Recommend follow-up laboratory monitoring. 4. Recommend continued Sandostatin injections. 5. Patient appears well and in good spirits. Minimal if any symptomology. Electronically Signed   By: Suzy Bouchard M.D.   On: 02/15/2020 14:36   NM PET (NETSPOT GA 40 DOTATATE) SKULL BASE TO MID THIGH  Result Date: 02/15/2020 CLINICAL DATA:  Well differentiated neuroendocrine tumor with liver metastasis. Patient status post 2 cycles of peptide receptor radiotherapy re-treatment. Last re-treatment treatment 12/22/2019. Initial therapy 11/03/2016. EXAM: NUCLEAR MEDICINE PET SKULL BASE TO THIGH TECHNIQUE: 4.2 mCi Ga 37 DOTATATE was injected intravenously. Full-ring PET imaging was performed from the skull base to thigh after the radiotracer. CT data was obtained and used for attenuation correction and anatomic localization. COMPARISON:  DOTATATE PET scan 09/24/2019 FINDINGS: NECK No radiotracer activity in neck lymph nodes. Incidental CT findings: None CHEST No radiotracer accumulation within mediastinal or hilar lymph nodes. No suspicious pulmonary nodules on the CT scan. Incidental CT finding:None ABDOMEN/PELVIS Again demonstrated multifocal hepatic metastasis with radiotracer  activity. There is however clear interval decrease in the radiotracer activity following peptide receptor radiotherapy. Example lesions  include: Dominant lesion LEFT hepatic lobe measures 6.5 x 7.8 cm compared with 7.3 x 8.1 cm on prior. Lesion is decreased radiotracer activity with SUV max equal 14.3 compared SUV max equal 22. Lesion along the falciform ligament measures 2.8 cm compared with 3.1 cm. Lesion is decreased radiotracer activity with SUV max equal 20.4 compared SUV max equal 22 Lesion posterior RIGHT hepatic lobe measuring 5.5 cm compares to 5.0 cm (image 109/4) and has decreased radiotracer activity with SUV max equal 7.8 compared SUV max equal 10.4. Finally, in the lateral RIGHT hepatic lobe, dramatic visual reduction in radiotracer activity with SUV max equal 10.8 compared SUV max equal 15.0. Lesion measures approximately 5 cm (image 124/4) compared with 5.5 cm. No evidence metastatic adenopathy within the mesentery. No abnormal activity within bowel or pancreas. Physiologic activity noted in the liver, spleen, adrenal glands and kidneys. Incidental CT findings:None SKELETON Focal radiotracer activity in the LEFT iliac bone with SUV max equal 2.6 compared to SUV max equal 3.7. Small sclerotic lesion at this level (image 163/4) is unchanged. Incidental CT findings:None IMPRESSION: 1. Measurable decrease in radiotracer activity both visually and by direct measurement within the metastatic lesions within liver. No new lesions identified. 2. Additionally, above lesions in both hepatic lobe are decreased in size compared to most recent DOTATATE PET scan 09/24/2019. 3. No new or progressive disease in the bowel or mesentery. 4. Decreased activity of isolated lesion in the LEFT iliac bone. Electronically Signed   By: Suzy Bouchard M.D.   On: 02/15/2020 13:17    Medications: I have reviewed the patient's current medications.   Assessment/Plan: 1. Pancreatic neuroendocrine tumor, WHO grade 2, pancreatic head mass and small peripancreatic/celiac nodes on a CT 02/04/2014  EUS revealed evidence of multiple liver metastases-status post  an FNA biopsy of a left liver lesion 02/10/2014 confirming a neuroendocrine tumor   Octreotide scan 04/01/2014 with multiple foci of metastatic neuroendocrine tumor in the liver  Monthly Sandostatin started 03/16/2014  Restaging CT 07/04/2014 with resolution of a previously noted pancreas head lesion and probable progression of liver metastases  Restaging CT 10/31/2014-stable liver lesions, no evidence of a pancreas mass, stable.  Restaging CT 02/28/2015-stable hepatic metastases, stable pancreas lesion unchanged  Restaging CT 08/30/2015-stable pancreas mass, slight enlargement of hepatic metastases  Monthly Sandostatin continued  Restaging CTs06/14/2017 revealed enlargement of liver lesions, no new lesions, enlargement of the pancreas head mass  Gallium DOTATATEscan 10/01/2016 confirmed uptake in the liver metastases, pancreas primary, peripancreatic adenopathy, and left iliac bone  Cycle 1 Lutathera 03/05/2017  Cycle 2Lutathera09/26/2018  Cycle 3 Lutathera 07/02/2017  Cycle4Lutathera 08/27/2017  Netspotscan 09/29/2017-decreased radiotracer activity within the pancreas head,peripancreaticlymph node, and solitary skeletal lesion. Liver lesions have increased in size with a decrease in radiotracer activity  Monthly Sandostatin continued  Netspot scan 05/20/2018-stable to decreased size of liver lesions, most have decreased SUV, similar uptake in the pancreas lesion, previously noted peripancreatic lymph node has resolved him a mild decrease in uptake associated with a left iliac metastasis, no evidence of disease progression  Monthly Sandostatin continued  Netspot scan 01/29/2019- overall stable to improved, 1 lesion left hepatic lobe has increased tracer activity-unchanged in size, majority of hepatic lesions have reduced activity compared to the pretreatment scan, decreased activity left iliac bone lesion, stable activity in the head of the pancreas lesion, no new  lesions  Monthly Sandostatin continued  Netspot on  September 24, 2019-increase in size of 3 liver lesions with associated stable radiotracer activity, no new lesions, stable small left iliac lesion  Cycle 1 salvage Lutathera 10/27/2019  Cycle 2 salvage Lutathera 12/22/2019  Netspot on 02/15/2020-decrease in radiotracer activity in the liver metastases with a decrease in size of several lesions, no new lesions, no progressive disease in the bowel or mesentery, decreased activity in the left iliac bone lesion  Monthly Sandostatin continued  2. Chest Seminoma in 1990 treated with BEP and chest radiation at [facility] 3. 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 chronic exertional dyspnea following treatment for the seminoma  5. aortic stenosis on an echocardiogram December 2011, severe aortic stenosis on echocardiogram 12/06/2015, status postTAVR on 04/16/2016 6. lumbar disc surgery 2014  7. acute abdominal pain 02/04/2014-resolved  8.  Diabetes    Disposition: He appears stable.  The restaging Netspot shows improvement in the liver and bone lesions.  He will continue monthly Sandostatin.  Mr. Mark will be scheduled for an office visit in 4 months.  Betsy Coder, MD  02/16/2020  10:36 AM

## 2020-02-16 NOTE — Telephone Encounter (Signed)
Scheduled per 07/14 los, patient received updated calender.  

## 2020-02-16 NOTE — Patient Instructions (Signed)
Octreotide injection solution What is this medicine? OCTREOTIDE (ok TREE oh tide) is used to reduce blood levels of growth hormone in patients with a condition called acromegaly. This medicine also reduces flushing and watery diarrhea caused by certain types of cancer. This medicine may be used for other purposes; ask your health care provider or pharmacist if you have questions. COMMON BRAND NAME(S): Bynfezia, Sandostatin What should I tell my health care provider before I take this medicine? They need to know if you have any of these conditions:  diabetes  gallbladder disease  kidney disease  liver disease  thyroid disease  an unusual or allergic reaction to octreotide, other medicines, foods, dyes, or preservatives  pregnant or trying to get pregnant  breast-feeding How should I use this medicine? This medicine is for injection under the skin or into a vein (only in emergency situations). It is usually given by a health care professional in a hospital or clinic setting. If you get this medicine at home, you will be taught how to prepare and give this medicine. Allow the injection solution to come to room temperature before use. Do not warm it artificially. Use exactly as directed. Take your medicine at regular intervals. Do not take your medicine more often than directed. It is important that you put your used needles and syringes in a special sharps container. Do not put them in a trash can. If you do not have a sharps container, call your pharmacist or healthcare provider to get one. Talk to your pediatrician regarding the use of this medicine in children. Special care may be needed. Overdosage: If you think you have taken too much of this medicine contact a poison control center or emergency room at once. NOTE: This medicine is only for you. Do not share this medicine with others. What if I miss a dose? If you miss a dose, take it as soon as you can. If it is almost time for your  next dose, take only that dose. Do not take double or extra doses. What may interact with this medicine?  bromocriptine  certain medicines for blood pressure, heart disease, irregular heartbeat  cyclosporine  diuretics  medicines for diabetes, including insulin  quinidine This list may not describe all possible interactions. Give your health care provider a list of all the medicines, herbs, non-prescription drugs, or dietary supplements you use. Also tell them if you smoke, drink alcohol, or use illegal drugs. Some items may interact with your medicine. What should I watch for while using this medicine? Visit your doctor or health care professional for regular checks on your progress. To help reduce irritation at the injection site, use a different site for each injection and make sure the solution is at room temperature before use. This medicine may cause decreases in blood sugar. Signs of low blood sugar include chills, cool, pale skin or cold sweats, drowsiness, extreme hunger, fast heartbeat, headache, nausea, nervousness or anxiety, shakiness, trembling, unsteadiness, tiredness, or weakness. Contact your doctor or health care professional right away if you experience any of these symptoms. This medicine may increase blood sugar. Ask your healthcare provider if changes in diet or medicines are needed if you have diabetes. This medicine may cause a decrease in vitamin B12. You should make sure that you get enough vitamin B12 while you are taking this medicine. Discuss the foods you eat and the vitamins you take with your health care professional. What side effects may I notice from receiving this medicine? Side   effects that you should report to your doctor or health care professional as soon as possible:  allergic reactions like skin rash, itching or hives, swelling of the face, lips, or tongue  fast, slow, or irregular heartbeat  right upper belly pain  severe stomach pain  signs  and symptoms of high blood sugar such as being more thirsty or hungry or having to urinate more than normal. You may also feel very tired or have blurry vision.  signs and symptoms of low blood sugar such as feeling anxious; confusion; dizziness; increased hunger; unusually weak or tired; increased sweating; shakiness; cold, clammy skin; irritable; headache; blurred vision; fast heartbeat; loss of consciousness  unusually weak or tired Side effects that usually do not require medical attention (report to your doctor or health care professional if they continue or are bothersome):  diarrhea  dizziness  gas  headache  nausea, vomiting  pain, redness, or irritation at site where injected  upset stomach This list may not describe all possible side effects. Call your doctor for medical advice about side effects. You may report side effects to FDA at 1-800-FDA-1088. Where should I keep my medicine? Keep out of the reach of children. Store in a refrigerator between 2 and 8 degrees C (36 and 46 degrees F). Protect from light. Allow to come to room temperature naturally. Do not use artificial heat. If protected from light, the injection may be stored at room temperature between 20 and 30 degrees C (70 and 86 degrees F) for 14 days. After the initial use, throw away any unused portion of a multiple dose vial after 14 days. Throw away unused portions of the ampules after use. NOTE: This sheet is a summary. It may not cover all possible information. If you have questions about this medicine, talk to your doctor, pharmacist, or health care provider.  2020 Elsevier/Gold Standard (2019-02-18 13:33:09)  

## 2020-02-17 LAB — CHROMOGRANIN A: Chromogranin A (ng/mL): 3381 ng/mL — ABNORMAL HIGH (ref 0.0–101.8)

## 2020-02-22 NOTE — Telephone Encounter (Signed)
Opened by accident, please disregard. 

## 2020-03-22 ENCOUNTER — Other Ambulatory Visit: Payer: Self-pay

## 2020-03-22 ENCOUNTER — Inpatient Hospital Stay: Payer: Medicare Other | Attending: Oncology

## 2020-03-22 VITALS — BP 126/64 | HR 77 | Temp 98.0°F | Resp 18

## 2020-03-22 DIAGNOSIS — D3A8 Other benign neuroendocrine tumors: Secondary | ICD-10-CM

## 2020-03-22 DIAGNOSIS — C7A8 Other malignant neuroendocrine tumors: Secondary | ICD-10-CM | POA: Diagnosis not present

## 2020-03-22 DIAGNOSIS — C7B8 Other secondary neuroendocrine tumors: Secondary | ICD-10-CM | POA: Insufficient documentation

## 2020-03-22 MED ORDER — OCTREOTIDE ACETATE 20 MG IM KIT
PACK | INTRAMUSCULAR | Status: AC
  Filled 2020-03-22: qty 1

## 2020-03-22 MED ORDER — OCTREOTIDE ACETATE 30 MG IM KIT
30.0000 mg | PACK | Freq: Once | INTRAMUSCULAR | Status: AC
  Administered 2020-03-22: 30 mg via INTRAMUSCULAR

## 2020-03-22 MED ORDER — OCTREOTIDE ACETATE 30 MG IM KIT
PACK | INTRAMUSCULAR | Status: AC
  Filled 2020-03-22: qty 1

## 2020-03-22 NOTE — Patient Instructions (Signed)
Octreotide injection solution What is this medicine? OCTREOTIDE (ok TREE oh tide) is used to reduce blood levels of growth hormone in patients with a condition called acromegaly. This medicine also reduces flushing and watery diarrhea caused by certain types of cancer. This medicine may be used for other purposes; ask your health care provider or pharmacist if you have questions. COMMON BRAND NAME(S): Bynfezia, Sandostatin What should I tell my health care provider before I take this medicine? They need to know if you have any of these conditions:  diabetes  gallbladder disease  kidney disease  liver disease  thyroid disease  an unusual or allergic reaction to octreotide, other medicines, foods, dyes, or preservatives  pregnant or trying to get pregnant  breast-feeding How should I use this medicine? This medicine is for injection under the skin or into a vein (only in emergency situations). It is usually given by a health care professional in a hospital or clinic setting. If you get this medicine at home, you will be taught how to prepare and give this medicine. Allow the injection solution to come to room temperature before use. Do not warm it artificially. Use exactly as directed. Take your medicine at regular intervals. Do not take your medicine more often than directed. It is important that you put your used needles and syringes in a special sharps container. Do not put them in a trash can. If you do not have a sharps container, call your pharmacist or healthcare provider to get one. Talk to your pediatrician regarding the use of this medicine in children. Special care may be needed. Overdosage: If you think you have taken too much of this medicine contact a poison control center or emergency room at once. NOTE: This medicine is only for you. Do not share this medicine with others. What if I miss a dose? If you miss a dose, take it as soon as you can. If it is almost time for your  next dose, take only that dose. Do not take double or extra doses. What may interact with this medicine?  bromocriptine  certain medicines for blood pressure, heart disease, irregular heartbeat  cyclosporine  diuretics  medicines for diabetes, including insulin  quinidine This list may not describe all possible interactions. Give your health care provider a list of all the medicines, herbs, non-prescription drugs, or dietary supplements you use. Also tell them if you smoke, drink alcohol, or use illegal drugs. Some items may interact with your medicine. What should I watch for while using this medicine? Visit your doctor or health care professional for regular checks on your progress. To help reduce irritation at the injection site, use a different site for each injection and make sure the solution is at room temperature before use. This medicine may cause decreases in blood sugar. Signs of low blood sugar include chills, cool, pale skin or cold sweats, drowsiness, extreme hunger, fast heartbeat, headache, nausea, nervousness or anxiety, shakiness, trembling, unsteadiness, tiredness, or weakness. Contact your doctor or health care professional right away if you experience any of these symptoms. This medicine may increase blood sugar. Ask your healthcare provider if changes in diet or medicines are needed if you have diabetes. This medicine may cause a decrease in vitamin B12. You should make sure that you get enough vitamin B12 while you are taking this medicine. Discuss the foods you eat and the vitamins you take with your health care professional. What side effects may I notice from receiving this medicine? Side   effects that you should report to your doctor or health care professional as soon as possible:  allergic reactions like skin rash, itching or hives, swelling of the face, lips, or tongue  fast, slow, or irregular heartbeat  right upper belly pain  severe stomach pain  signs  and symptoms of high blood sugar such as being more thirsty or hungry or having to urinate more than normal. You may also feel very tired or have blurry vision.  signs and symptoms of low blood sugar such as feeling anxious; confusion; dizziness; increased hunger; unusually weak or tired; increased sweating; shakiness; cold, clammy skin; irritable; headache; blurred vision; fast heartbeat; loss of consciousness  unusually weak or tired Side effects that usually do not require medical attention (report to your doctor or health care professional if they continue or are bothersome):  diarrhea  dizziness  gas  headache  nausea, vomiting  pain, redness, or irritation at site where injected  upset stomach This list may not describe all possible side effects. Call your doctor for medical advice about side effects. You may report side effects to FDA at 1-800-FDA-1088. Where should I keep my medicine? Keep out of the reach of children. Store in a refrigerator between 2 and 8 degrees C (36 and 46 degrees F). Protect from light. Allow to come to room temperature naturally. Do not use artificial heat. If protected from light, the injection may be stored at room temperature between 20 and 30 degrees C (70 and 86 degrees F) for 14 days. After the initial use, throw away any unused portion of a multiple dose vial after 14 days. Throw away unused portions of the ampules after use. NOTE: This sheet is a summary. It may not cover all possible information. If you have questions about this medicine, talk to your doctor, pharmacist, or health care provider.  2020 Elsevier/Gold Standard (2019-02-18 13:33:09)  

## 2020-04-25 ENCOUNTER — Other Ambulatory Visit: Payer: Self-pay | Admitting: Internal Medicine

## 2020-04-25 ENCOUNTER — Other Ambulatory Visit: Payer: Self-pay

## 2020-04-25 ENCOUNTER — Inpatient Hospital Stay: Payer: Medicare Other | Attending: Oncology

## 2020-04-25 VITALS — BP 124/67 | HR 82 | Temp 98.4°F | Resp 18

## 2020-04-25 DIAGNOSIS — C7A8 Other malignant neuroendocrine tumors: Secondary | ICD-10-CM | POA: Insufficient documentation

## 2020-04-25 DIAGNOSIS — D3A8 Other benign neuroendocrine tumors: Secondary | ICD-10-CM

## 2020-04-25 DIAGNOSIS — C7B8 Other secondary neuroendocrine tumors: Secondary | ICD-10-CM | POA: Diagnosis not present

## 2020-04-25 MED ORDER — OCTREOTIDE ACETATE 30 MG IM KIT
PACK | INTRAMUSCULAR | Status: AC
  Filled 2020-04-25: qty 1

## 2020-04-25 MED ORDER — OCTREOTIDE ACETATE 30 MG IM KIT
30.0000 mg | PACK | Freq: Once | INTRAMUSCULAR | Status: AC
  Administered 2020-04-25: 30 mg via INTRAMUSCULAR

## 2020-04-25 NOTE — Telephone Encounter (Signed)
Please refill as per office routine med refill policy (all routine meds refilled for 3 mo or monthly per pt preference up to one year from last visit, then month to month grace period for 3 mo, then further med refills will have to be denied)  

## 2020-04-25 NOTE — Patient Instructions (Signed)
Octreotide injection solution What is this medicine? OCTREOTIDE (ok TREE oh tide) is used to reduce blood levels of growth hormone in patients with a condition called acromegaly. This medicine also reduces flushing and watery diarrhea caused by certain types of cancer. This medicine may be used for other purposes; ask your health care provider or pharmacist if you have questions. COMMON BRAND NAME(S): Bynfezia, Sandostatin What should I tell my health care provider before I take this medicine? They need to know if you have any of these conditions:  diabetes  gallbladder disease  kidney disease  liver disease  thyroid disease  an unusual or allergic reaction to octreotide, other medicines, foods, dyes, or preservatives  pregnant or trying to get pregnant  breast-feeding How should I use this medicine? This medicine is for injection under the skin or into a vein (only in emergency situations). It is usually given by a health care professional in a hospital or clinic setting. If you get this medicine at home, you will be taught how to prepare and give this medicine. Allow the injection solution to come to room temperature before use. Do not warm it artificially. Use exactly as directed. Take your medicine at regular intervals. Do not take your medicine more often than directed. It is important that you put your used needles and syringes in a special sharps container. Do not put them in a trash can. If you do not have a sharps container, call your pharmacist or healthcare provider to get one. Talk to your pediatrician regarding the use of this medicine in children. Special care may be needed. Overdosage: If you think you have taken too much of this medicine contact a poison control center or emergency room at once. NOTE: This medicine is only for you. Do not share this medicine with others. What if I miss a dose? If you miss a dose, take it as soon as you can. If it is almost time for your  next dose, take only that dose. Do not take double or extra doses. What may interact with this medicine?  bromocriptine  certain medicines for blood pressure, heart disease, irregular heartbeat  cyclosporine  diuretics  medicines for diabetes, including insulin  quinidine This list may not describe all possible interactions. Give your health care provider a list of all the medicines, herbs, non-prescription drugs, or dietary supplements you use. Also tell them if you smoke, drink alcohol, or use illegal drugs. Some items may interact with your medicine. What should I watch for while using this medicine? Visit your doctor or health care professional for regular checks on your progress. To help reduce irritation at the injection site, use a different site for each injection and make sure the solution is at room temperature before use. This medicine may cause decreases in blood sugar. Signs of low blood sugar include chills, cool, pale skin or cold sweats, drowsiness, extreme hunger, fast heartbeat, headache, nausea, nervousness or anxiety, shakiness, trembling, unsteadiness, tiredness, or weakness. Contact your doctor or health care professional right away if you experience any of these symptoms. This medicine may increase blood sugar. Ask your healthcare provider if changes in diet or medicines are needed if you have diabetes. This medicine may cause a decrease in vitamin B12. You should make sure that you get enough vitamin B12 while you are taking this medicine. Discuss the foods you eat and the vitamins you take with your health care professional. What side effects may I notice from receiving this medicine? Side   effects that you should report to your doctor or health care professional as soon as possible:  allergic reactions like skin rash, itching or hives, swelling of the face, lips, or tongue  fast, slow, or irregular heartbeat  right upper belly pain  severe stomach pain  signs  and symptoms of high blood sugar such as being more thirsty or hungry or having to urinate more than normal. You may also feel very tired or have blurry vision.  signs and symptoms of low blood sugar such as feeling anxious; confusion; dizziness; increased hunger; unusually weak or tired; increased sweating; shakiness; cold, clammy skin; irritable; headache; blurred vision; fast heartbeat; loss of consciousness  unusually weak or tired Side effects that usually do not require medical attention (report to your doctor or health care professional if they continue or are bothersome):  diarrhea  dizziness  gas  headache  nausea, vomiting  pain, redness, or irritation at site where injected  upset stomach This list may not describe all possible side effects. Call your doctor for medical advice about side effects. You may report side effects to FDA at 1-800-FDA-1088. Where should I keep my medicine? Keep out of the reach of children. Store in a refrigerator between 2 and 8 degrees C (36 and 46 degrees F). Protect from light. Allow to come to room temperature naturally. Do not use artificial heat. If protected from light, the injection may be stored at room temperature between 20 and 30 degrees C (70 and 86 degrees F) for 14 days. After the initial use, throw away any unused portion of a multiple dose vial after 14 days. Throw away unused portions of the ampules after use. NOTE: This sheet is a summary. It may not cover all possible information. If you have questions about this medicine, talk to your doctor, pharmacist, or health care provider.  2020 Elsevier/Gold Standard (2019-02-18 13:33:09)  

## 2020-04-26 ENCOUNTER — Other Ambulatory Visit: Payer: Self-pay | Admitting: Internal Medicine

## 2020-05-09 ENCOUNTER — Ambulatory Visit: Payer: Medicare Other | Admitting: Cardiovascular Disease

## 2020-05-10 DIAGNOSIS — E119 Type 2 diabetes mellitus without complications: Secondary | ICD-10-CM | POA: Diagnosis not present

## 2020-05-17 ENCOUNTER — Inpatient Hospital Stay: Payer: Medicare Other | Attending: Oncology

## 2020-05-17 ENCOUNTER — Other Ambulatory Visit: Payer: Self-pay

## 2020-05-17 DIAGNOSIS — C7B8 Other secondary neuroendocrine tumors: Secondary | ICD-10-CM | POA: Insufficient documentation

## 2020-05-17 DIAGNOSIS — D3A8 Other benign neuroendocrine tumors: Secondary | ICD-10-CM

## 2020-05-17 DIAGNOSIS — C7A8 Other malignant neuroendocrine tumors: Secondary | ICD-10-CM | POA: Insufficient documentation

## 2020-05-17 NOTE — Progress Notes (Signed)
Pt is only 22 days out from his last Sandostatin injection. I spoke with Dr. [name] and he requested that the patient be rescheduled for next week instead of receiving his injection early. Pt walked to scheduling to reschedule his appt. for next week

## 2020-05-21 ENCOUNTER — Other Ambulatory Visit: Payer: Self-pay | Admitting: Internal Medicine

## 2020-05-24 ENCOUNTER — Inpatient Hospital Stay: Payer: Medicare Other

## 2020-05-24 ENCOUNTER — Other Ambulatory Visit: Payer: Self-pay

## 2020-05-24 VITALS — BP 130/73 | HR 79 | Temp 98.0°F | Resp 18

## 2020-05-24 DIAGNOSIS — C7A8 Other malignant neuroendocrine tumors: Secondary | ICD-10-CM | POA: Diagnosis not present

## 2020-05-24 DIAGNOSIS — C7B8 Other secondary neuroendocrine tumors: Secondary | ICD-10-CM | POA: Diagnosis not present

## 2020-05-24 DIAGNOSIS — D3A8 Other benign neuroendocrine tumors: Secondary | ICD-10-CM

## 2020-05-24 MED ORDER — OCTREOTIDE ACETATE 30 MG IM KIT
30.0000 mg | PACK | Freq: Once | INTRAMUSCULAR | Status: AC
  Administered 2020-05-24: 30 mg via INTRAMUSCULAR

## 2020-05-24 MED ORDER — OCTREOTIDE ACETATE 30 MG IM KIT
PACK | INTRAMUSCULAR | Status: AC
  Filled 2020-05-24: qty 1

## 2020-05-24 NOTE — Patient Instructions (Signed)
Octreotide injection solution What is this medicine? OCTREOTIDE (ok TREE oh tide) is used to reduce blood levels of growth hormone in patients with a condition called acromegaly. This medicine also reduces flushing and watery diarrhea caused by certain types of cancer. This medicine may be used for other purposes; ask your health care provider or pharmacist if you have questions. COMMON BRAND NAME(S): Bynfezia, Sandostatin What should I tell my health care provider before I take this medicine? They need to know if you have any of these conditions:  diabetes  gallbladder disease  kidney disease  liver disease  thyroid disease  an unusual or allergic reaction to octreotide, other medicines, foods, dyes, or preservatives  pregnant or trying to get pregnant  breast-feeding How should I use this medicine? This medicine is for injection under the skin or into a vein (only in emergency situations). It is usually given by a health care professional in a hospital or clinic setting. If you get this medicine at home, you will be taught how to prepare and give this medicine. Allow the injection solution to come to room temperature before use. Do not warm it artificially. Use exactly as directed. Take your medicine at regular intervals. Do not take your medicine more often than directed. It is important that you put your used needles and syringes in a special sharps container. Do not put them in a trash can. If you do not have a sharps container, call your pharmacist or healthcare provider to get one. Talk to your pediatrician regarding the use of this medicine in children. Special care may be needed. Overdosage: If you think you have taken too much of this medicine contact a poison control center or emergency room at once. NOTE: This medicine is only for you. Do not share this medicine with others. What if I miss a dose? If you miss a dose, take it as soon as you can. If it is almost time for your  next dose, take only that dose. Do not take double or extra doses. What may interact with this medicine?  bromocriptine  certain medicines for blood pressure, heart disease, irregular heartbeat  cyclosporine  diuretics  medicines for diabetes, including insulin  quinidine This list may not describe all possible interactions. Give your health care provider a list of all the medicines, herbs, non-prescription drugs, or dietary supplements you use. Also tell them if you smoke, drink alcohol, or use illegal drugs. Some items may interact with your medicine. What should I watch for while using this medicine? Visit your doctor or health care professional for regular checks on your progress. To help reduce irritation at the injection site, use a different site for each injection and make sure the solution is at room temperature before use. This medicine may cause decreases in blood sugar. Signs of low blood sugar include chills, cool, pale skin or cold sweats, drowsiness, extreme hunger, fast heartbeat, headache, nausea, nervousness or anxiety, shakiness, trembling, unsteadiness, tiredness, or weakness. Contact your doctor or health care professional right away if you experience any of these symptoms. This medicine may increase blood sugar. Ask your healthcare provider if changes in diet or medicines are needed if you have diabetes. This medicine may cause a decrease in vitamin B12. You should make sure that you get enough vitamin B12 while you are taking this medicine. Discuss the foods you eat and the vitamins you take with your health care professional. What side effects may I notice from receiving this medicine? Side   effects that you should report to your doctor or health care professional as soon as possible:  allergic reactions like skin rash, itching or hives, swelling of the face, lips, or tongue  fast, slow, or irregular heartbeat  right upper belly pain  severe stomach pain  signs  and symptoms of high blood sugar such as being more thirsty or hungry or having to urinate more than normal. You may also feel very tired or have blurry vision.  signs and symptoms of low blood sugar such as feeling anxious; confusion; dizziness; increased hunger; unusually weak or tired; increased sweating; shakiness; cold, clammy skin; irritable; headache; blurred vision; fast heartbeat; loss of consciousness  unusually weak or tired Side effects that usually do not require medical attention (report to your doctor or health care professional if they continue or are bothersome):  diarrhea  dizziness  gas  headache  nausea, vomiting  pain, redness, or irritation at site where injected  upset stomach This list may not describe all possible side effects. Call your doctor for medical advice about side effects. You may report side effects to FDA at 1-800-FDA-1088. Where should I keep my medicine? Keep out of the reach of children. Store in a refrigerator between 2 and 8 degrees C (36 and 46 degrees F). Protect from light. Allow to come to room temperature naturally. Do not use artificial heat. If protected from light, the injection may be stored at room temperature between 20 and 30 degrees C (70 and 86 degrees F) for 14 days. After the initial use, throw away any unused portion of a multiple dose vial after 14 days. Throw away unused portions of the ampules after use. NOTE: This sheet is a summary. It may not cover all possible information. If you have questions about this medicine, talk to your doctor, pharmacist, or health care provider.  2020 Elsevier/Gold Standard (2019-02-18 13:33:09)  

## 2020-06-13 ENCOUNTER — Encounter: Payer: Self-pay | Admitting: Cardiovascular Disease

## 2020-06-13 ENCOUNTER — Ambulatory Visit (INDEPENDENT_AMBULATORY_CARE_PROVIDER_SITE_OTHER): Payer: Medicare Other | Admitting: Cardiovascular Disease

## 2020-06-13 ENCOUNTER — Other Ambulatory Visit: Payer: Self-pay

## 2020-06-13 VITALS — BP 120/60 | HR 84 | Ht 70.0 in | Wt 173.2 lb

## 2020-06-13 DIAGNOSIS — I10 Essential (primary) hypertension: Secondary | ICD-10-CM

## 2020-06-13 DIAGNOSIS — I350 Nonrheumatic aortic (valve) stenosis: Secondary | ICD-10-CM

## 2020-06-13 DIAGNOSIS — Z952 Presence of prosthetic heart valve: Secondary | ICD-10-CM | POA: Diagnosis not present

## 2020-06-13 NOTE — Progress Notes (Signed)
Cardiology Office Note   Date:  06/13/2020   ID:  Ronald Thompson, DOB [birth year], MRN [id number]  PCP:  [name], MD  Cardiologist:   Mertie Moores, MD   Chief Complaint  Patient presents with  . Aortic Stenosis   Problem list 1. Aortic stenosis - likely had a bicuspid AV  2. Essential hypertension 3. Permanent fibrosis 4. Hyperlipidemia 5. Metastatic seminoma - to his chest ,  S/p chemo and XRT  Took VP 16 - caused some pulmonary fibrosis  6. Neuroendocrine tumur - Pancreatic cancer with mets to Liver .    Notes from 2017:  Ronald Thompson is a 64 y.o. male who presents for evaluation of his aortic stenosis. He has a history of metastatic seminoma up to his chest. He received high-dose chemotherapy and XRT .   It also accompanied some of his heart and extended down to the diaphragm.  2 years ago he was diagnosed with a neuroendocrine tumor-likely to be pancreatic cancer.  Gets saldostatin monthly .  He has some metastases to his liver.   He sees [name]  Has been told that there is no cure, but the growth rate has been slowed dramaticlly .    Has had dyspnea for the past 25 years- since the seminoma was treated .  Has significant DOE but he is able to lift weights.  Does have DOE with walking up hills or climbing stairs.  Does ok on level ground .   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 He had TAVR on April 16, 2016.  He is overall done very well. He has a complex medical history including metastatic pancreatic neuroendocrine tumor.  Still has DOE. And fatigue  Has pulmonary fibrosis from the chemo ( VP 16, bleomycin, radiation therapy)   Still working through the neuroendocrine tumor PET scan in several week  No CP  Does not jog. Works  [employer] on a level surface without difficulities.   BP is typically well controlled.   Sept. 29, 2020  Doing well .    Chronically short of breath  Has pulmonary fibrosis from chemo   Nov. 9, 2021:  Ronald Thompson is doing well.   Has lost 10-12 lbs.  Was diagnosed with DM2 TAVR is working well No dyspnea.   Can walk several miles without difficulty  Had his TAVR 4 years ago .    Past Medical History:  Diagnosis Date  . Baker's cyst   . Gallstones   . Heart murmur   . Hypercholesteremia   . Hypertension   . Lesion of left lung    hx.25 yrs ago- testicilar cancer related- only scarring left after tx.-no problems now  . Liver metastasis ([facility])   . 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  . Pericarditis    2nd to tumor  . Pneumonia 1990  . Primary neuroendocrine tumor of pancreas 02/14/2014   Stage IV with multiple mets to liver  . Pulmonary fibrosis ([facility]) 11/08/2015  . S/P TAVR (transcatheter aortic valve replacement) 04/16/2016   26 mm Edwards Sapien 3 transcatheter heart valve placed via percutaneous right transfemoral approach   . Shortness of breath dyspnea    with exertion and when tired  . 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 Transfusion history    hx. 25 yrs ago-during cancer tx.    Past Surgical History:  Procedure Laterality Date  . BACK SURGERY     '14- rupt. disc   . CARDIAC CATHETERIZATION N/A 01/24/2016   Procedure: Right/Left Heart Cath and Coronary Angiography;  Surgeon: [name], MD;  Location: [facility];  Service: Cardiovascular;  Laterality: N/A;  . COLONOSCOPY    . EUS N/A 02/10/2014   Procedure: UPPER ENDOSCOPIC ULTRASOUND (EUS) LINEAR;  Surgeon: [name], MD;  Location: [facility];  Service: Endoscopy;  Laterality: N/A;  . IR RADIOLOGIST EVAL & MGMT  02/11/2017  . KNEE SURGERY Right    age 18 for Baker's cyst  . NASAL SEPTUM SURGERY     Deviated septrum  . TEE WITHOUT CARDIOVERSION N/A 04/16/2016    Procedure: TRANSESOPHAGEAL ECHOCARDIOGRAM (TEE);  Surgeon: [name], MD;  Location: [facility];  Service: Open Heart Surgery;  Laterality: N/A;  . THORACOTOMY  1990   wedge biopsy of mediastinal mass  . TRANSCATHETER AORTIC VALVE REPLACEMENT, TRANSFEMORAL N/A 04/16/2016   Procedure: TRANSCATHETER AORTIC VALVE REPLACEMENT, TRANSFEMORAL;  Surgeon: [name], MD;  Location: [facility];  Service: Open Heart Surgery;  Laterality: N/A;     Current Outpatient Medications  Medication Sig Dispense Refill  . acetaminophen (TYLENOL) 500 MG tablet Take 1,000 mg by mouth every 6 (six) hours as needed for headache.    . albuterol (PROVENTIL HFA;VENTOLIN HFA) 108 (90 Base) MCG/ACT inhaler Inhale 1-2 puffs into the lungs every 6 (six) hours as needed for wheezing or shortness of breath. 1 Inhaler 0  . Blood Pressure Monitoring (BLOOD PRESSURE CUFF) MISC Use as directed 1 each 0  . calcium carbonate (TUMS - DOSED IN MG ELEMENTAL CALCIUM) 500 MG chewable tablet Chew 3 tablets by mouth 3 (three) times daily as needed for indigestion or heartburn.     . clindamycin (CLEOCIN) 300 MG capsule Take 2 capsules by mouth one hour prior to dental appointment 6 capsule 2  . cyclobenzaprine (FLEXERIL) 5 MG tablet Take 1 tablet (5 mg total) by mouth 3 (three) times daily as needed for muscle spasms. 40 tablet 0  . docusate sodium (COLACE) 100 MG capsule Take 100 mg by mouth daily as needed for mild constipation.    . gabapentin (NEURONTIN) 300 MG capsule TAKE ONE CAPSULE BY MOUTH THREE TIMES A DAY   START AFRER 100 MG INTIAL RX 90 capsule 1  . glucose blood (ONETOUCH VERIO) test strip Use as instructed to test blood sugar one time daily E11.65 100 each 12  . ibuprofen (ADVIL,MOTRIN) 200 MG tablet Take 400 mg by mouth every 6 (six) hours as needed for headache or mild pain.     Elmore Guise Device MISC Use as instructed to test blood sugar daily E11.65 1 each 2  . losartan-hydrochlorothiazide (HYZAAR) 100-25 MG tablet TAKE 1  TABLET BY MOUTH DAILY 90 tablet 2  . metFORMIN (GLUCOPHAGE-XR) 500 MG 24 hr tablet Take 1 tablet (500 mg total) by mouth daily with breakfast. 90 tablet 3  . Octreotide Acetate (SANDOSTATIN IJ) Inject 1 each as directed every 30 (thirty) days.     . ondansetron (ZOFRAN) 8 MG tablet Take 1 tablet (8 mg total) by mouth 2 (two) times daily as needed for nausea or vomiting. 20 tablet 0  . OneTouch Delica Lancets 54S MISC Use as instructed to test blood sugar once daily E11.65 150 each 6  . sildenafil (VIAGRA)  100 MG tablet TAKE 1/2 TO 1 TABLET BY MOUTH DAILY AS NEEDED FOR FOR ERECTILE DYSFUNCTION 5 tablet 10  . simvastatin (ZOCOR) 20 MG tablet TAKE ONE TABLET BY MOUTH DAILY AT 6PM 90 tablet 3   No current facility-administered medications for this visit.   Facility-Administered Medications Ordered in Other Visits  Medication Dose Route Frequency Provider Last Rate Last Admin  . heparin lock flush 100 unit/mL  500 Units Intravenous Once Betsy Coder B, MD      . octreotide (SANDOSTATIN LAR) 30 MG IM injection           . sodium chloride flush (NS) 0.9 % injection 10 mL  10 mL Intracatheter PRN Ladell Pier, MD        Allergies:   Penicillins    Social History:  The patient  reports that he has never smoked. He has never used smokeless tobacco. He reports current alcohol use. He reports that he does not use drugs.   Family History:  The patient's family history includes Cancer in his maternal aunt and paternal grandfather; Dementia in his mother; Emphysema in his maternal aunt.    ROS:  Please see the history of present illness.    Physical Exam: Blood pressure 120/60, pulse 84, height 5\' 10"  (1.778 m), weight 173 lb 3.2 oz (78.6 kg), SpO2 93 %.  GEN:  Well nourished, well developed in no acute distress HEENT: Normal NECK: No JVD; No carotid bruits LYMPHATICS: No lymphadenopathy CARDIAC: RRR, soft diastolic murmur  RESPIRATORY:  Clear to auscultation without rales, wheezing or  rhonchi  ABDOMEN: Soft, non-tender, non-distended MUSCULOSKELETAL:  No edema; No deformity  SKIN: Warm and dry NEUROLOGIC:  Alert and oriented x 3   EKG: June 13, 2020: Normal sinus rhythm.  No ST or T wave changes.   Recent Labs: 09/22/2019: TSH 1.33 01/19/2020: ALT 22; BUN 23; Creatinine 1.25; Potassium 4.2; Sodium 140 02/16/2020: Hemoglobin 13.3; Platelet Count 115    Lipid Panel    Component Value Date/Time   CHOL 165 09/22/2019 1149   TRIG 68.0 09/22/2019 1149   HDL 53.50 09/22/2019 1149   CHOLHDL 3 09/22/2019 1149   VLDL 13.6 09/22/2019 1149   LDLCALC 98 09/22/2019 1149   LDLDIRECT 139.2 10/10/2008 0800      Wt Readings from Last 3 Encounters:  06/13/20 173 lb 3.2 oz (78.6 kg)  02/16/20 172 lb 8 oz (78.2 kg)  01/19/20 172 lb 1.6 oz (78.1 kg)      Other studies Reviewed: Additional studies/ records that were reviewed today include: . Review of the above records demonstrates:    ASSESSMENT AND PLAN:  1.  Aortic stenosis: Patient is doing well status post TAVR in 2017.  He is not having any symptoms.  He does have a soft diastolic murmur but the I suspect this is from his mild pulmonic insufficiency.  His last echo shows that his TAVR is working well.    2.  Hypertension:    Blood pressure seems to be well controlled.  3.  Pulmonic insufficiency: He has mild pulmonic insufficiency.  This is stable.   Current medicines are reviewed at length with the patient today.  The patient does not have concerns regarding medicines.  The following changes have been made:  no change  Labs/ tests ordered today include:   Orders Placed This Encounter  Procedures  . EKG 12-Lead       Mertie Moores, MD  06/13/2020 2:51 PM    [facility] [address], [place], [place]  [address] Phone: [phone]; Fax: [phone]

## 2020-06-13 NOTE — Patient Instructions (Signed)

## 2020-06-20 ENCOUNTER — Inpatient Hospital Stay: Payer: Medicare Other

## 2020-06-20 ENCOUNTER — Other Ambulatory Visit: Payer: Self-pay

## 2020-06-20 ENCOUNTER — Inpatient Hospital Stay: Payer: Medicare Other | Attending: Oncology | Admitting: Oncology

## 2020-06-20 VITALS — BP 140/81 | HR 74 | Temp 97.2°F | Resp 16 | Ht 70.0 in | Wt 169.4 lb

## 2020-06-20 DIAGNOSIS — D3A8 Other benign neuroendocrine tumors: Secondary | ICD-10-CM

## 2020-06-20 DIAGNOSIS — Z23 Encounter for immunization: Secondary | ICD-10-CM

## 2020-06-20 DIAGNOSIS — C7A8 Other malignant neuroendocrine tumors: Secondary | ICD-10-CM | POA: Insufficient documentation

## 2020-06-20 DIAGNOSIS — C7B8 Other secondary neuroendocrine tumors: Secondary | ICD-10-CM | POA: Insufficient documentation

## 2020-06-20 MED ORDER — OCTREOTIDE ACETATE 30 MG IM KIT
30.0000 mg | PACK | Freq: Once | INTRAMUSCULAR | Status: AC
  Administered 2020-06-20: 30 mg via INTRAMUSCULAR

## 2020-06-20 MED ORDER — INFLUENZA VAC A&B SA ADJ QUAD 0.5 ML IM PRSY
0.5000 mL | PREFILLED_SYRINGE | Freq: Once | INTRAMUSCULAR | Status: AC
  Administered 2020-06-20: 0.5 mL via INTRAMUSCULAR

## 2020-06-20 MED ORDER — OCTREOTIDE ACETATE 30 MG IM KIT
PACK | INTRAMUSCULAR | Status: AC
  Filled 2020-06-20: qty 1

## 2020-06-20 MED ORDER — INFLUENZA VAC A&B SA ADJ QUAD 0.5 ML IM PRSY
PREFILLED_SYRINGE | INTRAMUSCULAR | Status: AC
  Filled 2020-06-20: qty 0.5

## 2020-06-20 NOTE — Patient Instructions (Addendum)
Octreotide injection solution What is this medicine? OCTREOTIDE (ok TREE oh tide) is used to reduce blood levels of growth hormone in patients with a condition called acromegaly. This medicine also reduces flushing and watery diarrhea caused by certain types of cancer. This medicine may be used for other purposes; ask your health care provider or pharmacist if you have questions. COMMON BRAND NAME(S): Bynfezia, Sandostatin What should I tell my health care provider before I take this medicine? They need to know if you have any of these conditions:  diabetes  gallbladder disease  kidney disease  liver disease  thyroid disease  an unusual or allergic reaction to octreotide, other medicines, foods, dyes, or preservatives  pregnant or trying to get pregnant  breast-feeding How should I use this medicine? This medicine is for injection under the skin or into a vein (only in emergency situations). It is usually given by a health care professional in a hospital or clinic setting. If you get this medicine at home, you will be taught how to prepare and give this medicine. Allow the injection solution to come to room temperature before use. Do not warm it artificially. Use exactly as directed. Take your medicine at regular intervals. Do not take your medicine more often than directed. It is important that you put your used needles and syringes in a special sharps container. Do not put them in a trash can. If you do not have a sharps container, call your pharmacist or healthcare provider to get one. Talk to your pediatrician regarding the use of this medicine in children. Special care may be needed. Overdosage: If you think you have taken too much of this medicine contact a poison control center or emergency room at once. NOTE: This medicine is only for you. Do not share this medicine with others. What if I miss a dose? If you miss a dose, take it as soon as you can. If it is almost time for your  next dose, take only that dose. Do not take double or extra doses. What may interact with this medicine?  bromocriptine  certain medicines for blood pressure, heart disease, irregular heartbeat  cyclosporine  diuretics  medicines for diabetes, including insulin  quinidine This list may not describe all possible interactions. Give your health care provider a list of all the medicines, herbs, non-prescription drugs, or dietary supplements you use. Also tell them if you smoke, drink alcohol, or use illegal drugs. Some items may interact with your medicine. What should I watch for while using this medicine? Visit your doctor or health care professional for regular checks on your progress. To help reduce irritation at the injection site, use a different site for each injection and make sure the solution is at room temperature before use. This medicine may cause decreases in blood sugar. Signs of low blood sugar include chills, cool, pale skin or cold sweats, drowsiness, extreme hunger, fast heartbeat, headache, nausea, nervousness or anxiety, shakiness, trembling, unsteadiness, tiredness, or weakness. Contact your doctor or health care professional right away if you experience any of these symptoms. This medicine may increase blood sugar. Ask your healthcare provider if changes in diet or medicines are needed if you have diabetes. This medicine may cause a decrease in vitamin B12. You should make sure that you get enough vitamin B12 while you are taking this medicine. Discuss the foods you eat and the vitamins you take with your health care professional. What side effects may I notice from receiving this medicine? Side   effects that you should report to your doctor or health care professional as soon as possible:  allergic reactions like skin rash, itching or hives, swelling of the face, lips, or tongue  fast, slow, or irregular heartbeat  right upper belly pain  severe stomach pain  signs  and symptoms of high blood sugar such as being more thirsty or hungry or having to urinate more than normal. You may also feel very tired or have blurry vision.  signs and symptoms of low blood sugar such as feeling anxious; confusion; dizziness; increased hunger; unusually weak or tired; increased sweating; shakiness; cold, clammy skin; irritable; headache; blurred vision; fast heartbeat; loss of consciousness  unusually weak or tired Side effects that usually do not require medical attention (report to your doctor or health care professional if they continue or are bothersome):  diarrhea  dizziness  gas  headache  nausea, vomiting  pain, redness, or irritation at site where injected  upset stomach This list may not describe all possible side effects. Call your doctor for medical advice about side effects. You may report side effects to FDA at 1-800-FDA-1088. Where should I keep my medicine? Keep out of the reach of children. Store in a refrigerator between 2 and 8 degrees C (36 and 46 degrees F). Protect from light. Allow to come to room temperature naturally. Do not use artificial heat. If protected from light, the injection may be stored at room temperature between 20 and 30 degrees C (70 and 86 degrees F) for 14 days. After the initial use, throw away any unused portion of a multiple dose vial after 14 days. Throw away unused portions of the ampules after use. NOTE: This sheet is a summary. It may not cover all possible information. If you have questions about this medicine, talk to your doctor, pharmacist, or health care provider.  2020 Elsevier/Gold Standard (2019-02-18 13:33:09) Influenza Virus Vaccine injection What is this medicine? INFLUENZA VIRUS VACCINE (in floo EN zuh VAHY ruhs vak SEEN) helps to reduce the risk of getting influenza also known as the flu. The vaccine only helps protect you against some strains of the flu. This medicine may be used for other purposes; ask  your health care provider or pharmacist if you have questions. COMMON BRAND NAME(S): Afluria, Afluria Quadrivalent, Agriflu, Alfuria, FLUAD, Fluarix, Fluarix Quadrivalent, Flublok, Flublok Quadrivalent, FLUCELVAX, FLUCELVAX Quadrivalent, Flulaval, Flulaval Quadrivalent, Fluvirin, Fluzone, Fluzone High-Dose, Fluzone Intradermal, Fluzone Quadrivalent What should I tell my health care provider before I take this medicine? They need to know if you have any of these conditions:  bleeding disorder like hemophilia  fever or infection  Guillain-Barre syndrome or other neurological problems  immune system problems  infection with the human immunodeficiency virus (HIV) or AIDS  low blood platelet counts  multiple sclerosis  an unusual or allergic reaction to influenza virus vaccine, latex, other medicines, foods, dyes, or preservatives. Different brands of vaccines contain different allergens. Some may contain latex or eggs. Talk to your doctor about your allergies to make sure that you get the right vaccine.  pregnant or trying to get pregnant  breast-feeding How should I use this medicine? This vaccine is for injection into a muscle or under the skin. It is given by a health care professional. A copy of Vaccine Information Statements will be given before each vaccination. Read this sheet carefully each time. The sheet may change frequently. Talk to your healthcare provider to see which vaccines are right for you. Some vaccines should not be  used in all age groups. Overdosage: If you think you have taken too much of this medicine contact a poison control center or emergency room at once. NOTE: This medicine is only for you. Do not share this medicine with others. What if I miss a dose? This does not apply. What may interact with this medicine?  chemotherapy or radiation therapy  medicines that lower your immune system like etanercept, anakinra, infliximab, and adalimumab  medicines that  treat or prevent blood clots like warfarin  phenytoin  steroid medicines like prednisone or cortisone  theophylline  vaccines This list may not describe all possible interactions. Give your health care provider a list of all the medicines, herbs, non-prescription drugs, or dietary supplements you use. Also tell them if you smoke, drink alcohol, or use illegal drugs. Some items may interact with your medicine. What should I watch for while using this medicine? Report any side effects that do not go away within 3 days to your doctor or health care professional. Call your health care provider if any unusual symptoms occur within 6 weeks of receiving this vaccine. You may still catch the flu, but the illness is not usually as bad. You cannot get the flu from the vaccine. The vaccine will not protect against colds or other illnesses that may cause fever. The vaccine is needed every year. What side effects may I notice from receiving this medicine? Side effects that you should report to your doctor or health care professional as soon as possible:  allergic reactions like skin rash, itching or hives, swelling of the face, lips, or tongue Side effects that usually do not require medical attention (report to your doctor or health care professional if they continue or are bothersome):  fever  headache  muscle aches and pains  pain, tenderness, redness, or swelling at the injection site  tiredness This list may not describe all possible side effects. Call your doctor for medical advice about side effects. You may report side effects to FDA at 1-800-FDA-1088. Where should I keep my medicine? The vaccine will be given by a health care professional in a clinic, pharmacy, doctor's office, or other health care setting. You will not be given vaccine doses to store at home. NOTE: This sheet is a summary. It may not cover all possible information. If you have questions about this medicine, talk to your  doctor, pharmacist, or health care provider.  2020 Elsevier/Gold Standard (2018-06-16 08:45:43)

## 2020-06-20 NOTE — Progress Notes (Signed)
[facility] Thompson OFFICE PROGRESS NOTE   Diagnosis: Pancreas neuroendocrine tumor  INTERVAL HISTORY:   Ronald Thompson returns as scheduled.  He feels well.  Good appetite.  He reports intentional weight loss with a change in his diet.  No pain.  He has a persistent suprapubic mass.  Objective:  Vital signs in last 24 hours:  Blood pressure 140/81, pulse 74, temperature (!) 97.2 F (36.2 C), temperature source Tympanic, resp. rate 16, height 5\' 10"  (1.778 m), weight 169 lb 6.4 oz (76.8 kg), SpO2 97 %.     Resp: Slight decrease in breath sounds at the left compared to the right chest, no respiratory distress Cardio: Regular rate and rhythm GI: No hepatosplenomegaly, nontender, soft mobile cutaneous mass in the suprapubic area, few additional abdominal wall lipomas Vascular: No leg edema   Lab Results:  Lab Results  Component Value Date   WBC 4.2 02/16/2020   HGB 13.3 02/16/2020   HCT 39.5 02/16/2020   MCV 98.8 02/16/2020   PLT 115 (L) 02/16/2020   NEUTROABS 3.3 02/16/2020    CMP  Lab Results  Component Value Date   NA 140 01/19/2020   K 4.2 01/19/2020   CL 100 01/19/2020   CO2 31 01/19/2020   GLUCOSE 95 01/19/2020   BUN 23 01/19/2020   CREATININE 1.25 (H) 01/19/2020   CALCIUM 9.7 01/19/2020   PROT 7.0 01/19/2020   ALBUMIN 4.3 01/19/2020   AST 29 01/19/2020   ALT 22 01/19/2020   ALKPHOS 53 01/19/2020   BILITOT 1.4 (H) 01/19/2020   GFRNONAA 60 (L) 01/19/2020   GFRAA >60 01/19/2020     Medications: I have reviewed the patient's current medications.   Assessment/Plan: 1. Pancreatic neuroendocrine tumor, WHO grade 2, pancreatic head mass and small peripancreatic/celiac nodes on a CT 02/04/2014  EUS revealed evidence of multiple liver metastases-status post an FNA biopsy of a left liver lesion 02/10/2014 confirming a neuroendocrine tumor   Octreotide scan 04/01/2014 with multiple foci of metastatic neuroendocrine tumor in the liver  Monthly  Sandostatin started 03/16/2014  Restaging CT 07/04/2014 with resolution of a previously noted pancreas head lesion and probable progression of liver metastases  Restaging CT 10/31/2014-stable liver lesions, no evidence of a pancreas mass, stable.  Restaging CT 02/28/2015-stable hepatic metastases, stable pancreas lesion unchanged  Restaging CT 08/30/2015-stable pancreas mass, slight enlargement of hepatic metastases  Monthly Sandostatin continued  Restaging CTs06/14/2017 revealed enlargement of liver lesions, no new lesions, enlargement of the pancreas head mass  Gallium DOTATATEscan 10/01/2016 confirmed uptake in the liver metastases, pancreas primary, peripancreatic adenopathy, and left iliac bone  Cycle 1 Lutathera 03/05/2017  Cycle 2Lutathera09/26/2018  Cycle 3 Lutathera 07/02/2017  Cycle4Lutathera 08/27/2017  Netspotscan 09/29/2017-decreased radiotracer activity within the pancreas head,peripancreaticlymph node, and solitary skeletal lesion. Liver lesions have increased in size with a decrease in radiotracer activity  Monthly Sandostatin continued  Netspot scan 05/20/2018-stable to decreased size of liver lesions, most have decreased SUV, similar uptake in the pancreas lesion, previously noted peripancreatic lymph node has resolved him a mild decrease in uptake associated with a left iliac metastasis, no evidence of disease progression  Monthly Sandostatin continued  Netspot scan 01/29/2019- overall stable to improved, 1 lesion left hepatic lobe has increased tracer activity-unchanged in size, majority of hepatic lesions have reduced activity compared to the pretreatment scan, decreased activity left iliac bone lesion, stable activity in the head of the pancreas lesion, no new lesions  Monthly Sandostatin continued  Netspot on September 24, 2019-increase in size  of 3 liver lesions with associated stable radiotracer activity, no new lesions, stable small left iliac  lesion  Cycle 1 salvage Lutathera 10/27/2019  Cycle 2 salvage Lutathera 12/22/2019  Netspot on 02/15/2020-decrease in radiotracer activity in the liver metastases with a decrease in size of several lesions, no new lesions, no progressive disease in the bowel or mesentery, decreased activity in the left iliac bone lesion  Monthly Sandostatin continued  2. 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 chronic exertional dyspnea following treatment for the seminoma  5. aortic stenosis on an echocardiogram December 2011, severe aortic stenosis on echocardiogram 12/06/2015, status postTAVR on 04/16/2016 6. lumbar disc surgery 2014  7. acute abdominal pain 02/04/2014-resolved  8.  Diabetes  Disposition: Ronald Thompson appears stable.  He will continue monthly Sandostatin.  We will check a chromogranin a level when he returns next month.  He will be scheduled for an office visit in 3 months.  He received an influenza vaccine today.  He will be scheduled for a Covid 19 booster vaccine next month.  The plan is to schedule a restaging Netspot approximately 8-9 months from the July 2021 scan.  Betsy Coder, MD  06/20/2020  10:23 AM

## 2020-06-21 ENCOUNTER — Telehealth: Payer: Self-pay | Admitting: Oncology

## 2020-06-21 NOTE — Telephone Encounter (Signed)
Scheduled appointments per 11/16 los. Spoke to patient who is aware of appointment date and time.

## 2020-07-19 ENCOUNTER — Inpatient Hospital Stay: Payer: Medicare Other | Attending: Oncology

## 2020-07-19 ENCOUNTER — Other Ambulatory Visit: Payer: Self-pay

## 2020-07-19 ENCOUNTER — Inpatient Hospital Stay: Payer: Medicare Other

## 2020-07-19 VITALS — BP 138/72 | HR 72 | Temp 98.2°F | Resp 18

## 2020-07-19 DIAGNOSIS — C7B8 Other secondary neuroendocrine tumors: Secondary | ICD-10-CM | POA: Diagnosis not present

## 2020-07-19 DIAGNOSIS — D3A8 Other benign neuroendocrine tumors: Secondary | ICD-10-CM

## 2020-07-19 DIAGNOSIS — Z23 Encounter for immunization: Secondary | ICD-10-CM

## 2020-07-19 DIAGNOSIS — C7A8 Other malignant neuroendocrine tumors: Secondary | ICD-10-CM | POA: Diagnosis not present

## 2020-07-19 LAB — CMP (CANCER CENTER ONLY)
ALT: 25 U/L (ref 0–44)
AST: 30 U/L (ref 15–41)
Albumin: 4.2 g/dL (ref 3.5–5.0)
Alkaline Phosphatase: 66 U/L (ref 38–126)
Anion gap: 8 (ref 5–15)
BUN: 22 mg/dL (ref 8–23)
CO2: 32 mmol/L (ref 22–32)
Calcium: 9.7 mg/dL (ref 8.9–10.3)
Chloride: 99 mmol/L (ref 98–111)
Creatinine: 1.37 mg/dL — ABNORMAL HIGH (ref 0.61–1.24)
GFR, Estimated: 57 mL/min — ABNORMAL LOW (ref >60)
Glucose, Bld: 137 mg/dL — ABNORMAL HIGH (ref 70–99)
Potassium: 4.4 mmol/L (ref 3.5–5.1)
Sodium: 139 mmol/L (ref 135–145)
Total Bilirubin: 1.4 mg/dL — ABNORMAL HIGH (ref 0.3–1.2)
Total Protein: 7.3 g/dL (ref 6.5–8.1)

## 2020-07-19 LAB — CBC WITH DIFFERENTIAL (CANCER CENTER ONLY)
Abs Immature Granulocytes: 0.01 10*3/uL (ref 0.00–0.07)
Basophils Absolute: 0 10*3/uL (ref 0.0–0.1)
Basophils Relative: 0 %
Eosinophils Absolute: 0.1 10*3/uL (ref 0.0–0.5)
Eosinophils Relative: 2 %
HCT: 41.5 % (ref 39.0–52.0)
Hemoglobin: 14 g/dL (ref 13.0–17.0)
Immature Granulocytes: 0 %
Lymphocytes Relative: 9 %
Lymphs Abs: 0.3 10*3/uL — ABNORMAL LOW (ref 0.7–4.0)
MCH: 34 pg (ref 26.0–34.0)
MCHC: 33.7 g/dL (ref 30.0–36.0)
MCV: 100.7 fL — ABNORMAL HIGH (ref 80.0–100.0)
Monocytes Absolute: 0.6 10*3/uL (ref 0.1–1.0)
Monocytes Relative: 15 %
Neutro Abs: 2.7 10*3/uL (ref 1.7–7.7)
Neutrophils Relative %: 74 %
Platelet Count: 115 10*3/uL — ABNORMAL LOW (ref 150–400)
RBC: 4.12 MIL/uL — ABNORMAL LOW (ref 4.22–5.81)
RDW: 12 % (ref 11.5–15.5)
WBC Count: 3.7 10*3/uL — ABNORMAL LOW (ref 4.0–10.5)
nRBC: 0 % (ref 0.0–0.2)

## 2020-07-19 MED ORDER — OCTREOTIDE ACETATE 30 MG IM KIT
30.0000 mg | PACK | Freq: Once | INTRAMUSCULAR | Status: AC
  Administered 2020-07-19: 30 mg via INTRAMUSCULAR

## 2020-07-19 NOTE — Patient Instructions (Signed)
Octreotide injection solution What is this medicine? OCTREOTIDE (ok TREE oh tide) is used to reduce blood levels of growth hormone in patients with a condition called acromegaly. This medicine also reduces flushing and watery diarrhea caused by certain types of cancer. This medicine may be used for other purposes; ask your health care provider or pharmacist if you have questions. COMMON BRAND NAME(S): Bynfezia, Sandostatin What should I tell my health care provider before I take this medicine? They need to know if you have any of these conditions:  diabetes  gallbladder disease  kidney disease  liver disease  thyroid disease  an unusual or allergic reaction to octreotide, other medicines, foods, dyes, or preservatives  pregnant or trying to get pregnant  breast-feeding How should I use this medicine? This medicine is for injection under the skin or into a vein (only in emergency situations). It is usually given by a health care professional in a hospital or clinic setting. If you get this medicine at home, you will be taught how to prepare and give this medicine. Allow the injection solution to come to room temperature before use. Do not warm it artificially. Use exactly as directed. Take your medicine at regular intervals. Do not take your medicine more often than directed. It is important that you put your used needles and syringes in a special sharps container. Do not put them in a trash can. If you do not have a sharps container, call your pharmacist or healthcare provider to get one. Talk to your pediatrician regarding the use of this medicine in children. Special care may be needed. Overdosage: If you think you have taken too much of this medicine contact a poison control center or emergency room at once. NOTE: This medicine is only for you. Do not share this medicine with others. What if I miss a dose? If you miss a dose, take it as soon as you can. If it is almost time for your  next dose, take only that dose. Do not take double or extra doses. What may interact with this medicine?  bromocriptine  certain medicines for blood pressure, heart disease, irregular heartbeat  cyclosporine  diuretics  medicines for diabetes, including insulin  quinidine This list may not describe all possible interactions. Give your health care provider a list of all the medicines, herbs, non-prescription drugs, or dietary supplements you use. Also tell them if you smoke, drink alcohol, or use illegal drugs. Some items may interact with your medicine. What should I watch for while using this medicine? Visit your doctor or health care professional for regular checks on your progress. To help reduce irritation at the injection site, use a different site for each injection and make sure the solution is at room temperature before use. This medicine may cause decreases in blood sugar. Signs of low blood sugar include chills, cool, pale skin or cold sweats, drowsiness, extreme hunger, fast heartbeat, headache, nausea, nervousness or anxiety, shakiness, trembling, unsteadiness, tiredness, or weakness. Contact your doctor or health care professional right away if you experience any of these symptoms. This medicine may increase blood sugar. Ask your healthcare provider if changes in diet or medicines are needed if you have diabetes. This medicine may cause a decrease in vitamin B12. You should make sure that you get enough vitamin B12 while you are taking this medicine. Discuss the foods you eat and the vitamins you take with your health care professional. What side effects may I notice from receiving this medicine? Side   effects that you should report to your doctor or health care professional as soon as possible:  allergic reactions like skin rash, itching or hives, swelling of the face, lips, or tongue  fast, slow, or irregular heartbeat  right upper belly pain  severe stomach pain  signs  and symptoms of high blood sugar such as being more thirsty or hungry or having to urinate more than normal. You may also feel very tired or have blurry vision.  signs and symptoms of low blood sugar such as feeling anxious; confusion; dizziness; increased hunger; unusually weak or tired; increased sweating; shakiness; cold, clammy skin; irritable; headache; blurred vision; fast heartbeat; loss of consciousness  unusually weak or tired Side effects that usually do not require medical attention (report to your doctor or health care professional if they continue or are bothersome):  diarrhea  dizziness  gas  headache  nausea, vomiting  pain, redness, or irritation at site where injected  upset stomach This list may not describe all possible side effects. Call your doctor for medical advice about side effects. You may report side effects to FDA at 1-800-FDA-1088. Where should I keep my medicine? Keep out of the reach of children. Store in a refrigerator between 2 and 8 degrees C (36 and 46 degrees F). Protect from light. Allow to come to room temperature naturally. Do not use artificial heat. If protected from light, the injection may be stored at room temperature between 20 and 30 degrees C (70 and 86 degrees F) for 14 days. After the initial use, throw away any unused portion of a multiple dose vial after 14 days. Throw away unused portions of the ampules after use. NOTE: This sheet is a summary. It may not cover all possible information. If you have questions about this medicine, talk to your doctor, pharmacist, or health care provider.  2020 Elsevier/Gold Standard (2019-02-18 13:33:09) Influenza Virus Vaccine injection What is this medicine? INFLUENZA VIRUS VACCINE (in floo EN zuh VAHY ruhs vak SEEN) helps to reduce the risk of getting influenza also known as the flu. The vaccine only helps protect you against some strains of the flu. This medicine may be used for other purposes; ask  your health care provider or pharmacist if you have questions. COMMON BRAND NAME(S): Afluria, Afluria Quadrivalent, Agriflu, Alfuria, FLUAD, Fluarix, Fluarix Quadrivalent, Flublok, Flublok Quadrivalent, FLUCELVAX, FLUCELVAX Quadrivalent, Flulaval, Flulaval Quadrivalent, Fluvirin, Fluzone, Fluzone High-Dose, Fluzone Intradermal, Fluzone Quadrivalent What should I tell my health care provider before I take this medicine? They need to know if you have any of these conditions:  bleeding disorder like hemophilia  fever or infection  Guillain-Barre syndrome or other neurological problems  immune system problems  infection with the human immunodeficiency virus (HIV) or AIDS  low blood platelet counts  multiple sclerosis  an unusual or allergic reaction to influenza virus vaccine, latex, other medicines, foods, dyes, or preservatives. Different brands of vaccines contain different allergens. Some may contain latex or eggs. Talk to your doctor about your allergies to make sure that you get the right vaccine.  pregnant or trying to get pregnant  breast-feeding How should I use this medicine? This vaccine is for injection into a muscle or under the skin. It is given by a health care professional. A copy of Vaccine Information Statements will be given before each vaccination. Read this sheet carefully each time. The sheet may change frequently. Talk to your healthcare provider to see which vaccines are right for you. Some vaccines should not be  used in all age groups. Overdosage: If you think you have taken too much of this medicine contact a poison control center or emergency room at once. NOTE: This medicine is only for you. Do not share this medicine with others. What if I miss a dose? This does not apply. What may interact with this medicine?  chemotherapy or radiation therapy  medicines that lower your immune system like etanercept, anakinra, infliximab, and adalimumab  medicines that  treat or prevent blood clots like warfarin  phenytoin  steroid medicines like prednisone or cortisone  theophylline  vaccines This list may not describe all possible interactions. Give your health care provider a list of all the medicines, herbs, non-prescription drugs, or dietary supplements you use. Also tell them if you smoke, drink alcohol, or use illegal drugs. Some items may interact with your medicine. What should I watch for while using this medicine? Report any side effects that do not go away within 3 days to your doctor or health care professional. Call your health care provider if any unusual symptoms occur within 6 weeks of receiving this vaccine. You may still catch the flu, but the illness is not usually as bad. You cannot get the flu from the vaccine. The vaccine will not protect against colds or other illnesses that may cause fever. The vaccine is needed every year. What side effects may I notice from receiving this medicine? Side effects that you should report to your doctor or health care professional as soon as possible:  allergic reactions like skin rash, itching or hives, swelling of the face, lips, or tongue Side effects that usually do not require medical attention (report to your doctor or health care professional if they continue or are bothersome):  fever  headache  muscle aches and pains  pain, tenderness, redness, or swelling at the injection site  tiredness This list may not describe all possible side effects. Call your doctor for medical advice about side effects. You may report side effects to FDA at 1-800-FDA-1088. Where should I keep my medicine? The vaccine will be given by a health care professional in a clinic, pharmacy, doctor's office, or other health care setting. You will not be given vaccine doses to store at home. NOTE: This sheet is a summary. It may not cover all possible information. If you have questions about this medicine, talk to your  doctor, pharmacist, or health care provider.  2020 Elsevier/Gold Standard (2018-06-16 08:45:43)

## 2020-07-20 ENCOUNTER — Encounter: Payer: Self-pay | Admitting: Internal Medicine

## 2020-07-20 ENCOUNTER — Ambulatory Visit (INDEPENDENT_AMBULATORY_CARE_PROVIDER_SITE_OTHER): Payer: Medicare Other | Admitting: Internal Medicine

## 2020-07-20 VITALS — BP 112/70 | HR 80 | Ht 70.0 in | Wt 170.0 lb

## 2020-07-20 DIAGNOSIS — E119 Type 2 diabetes mellitus without complications: Secondary | ICD-10-CM

## 2020-07-20 LAB — POCT GLYCOSYLATED HEMOGLOBIN (HGB A1C): Hemoglobin A1C: 5.5 % (ref 4.0–5.6)

## 2020-07-20 LAB — CHROMOGRANIN A: Chromogranin A (ng/mL): 3124 ng/mL — ABNORMAL HIGH (ref 0.0–101.8)

## 2020-07-20 NOTE — Patient Instructions (Addendum)
-   Stop Metformin   - Continue checking your sugar 2 times a week   - Notify us should your fasting sugars start going over 150 mg/dL

## 2020-07-20 NOTE — Progress Notes (Signed)
Name: Ronald Thompson  Age/ Sex: 64 y.o., male   MRN/ DOB: [id number], 01-21-1956     PCP: [name], MD   Reason for Endocrinology Evaluation: Type 2 Diabetes Mellitus  Initial Endocrine Consultative Visit: 10/11/2019    PATIENT IDENTIFIER: [name] is a 64 y.o. male with a past medical history of HTN and pancreatic neuroendocrine tumor 02/05/2014). The patient has followed with Endocrinology clinic since 10/11/2019 for consultative assistance with management of his diabetes.  DIABETIC HISTORY:  Mr. Kallal was diagnosed with DM in 09/2019. He was started on Metformin. His hemoglobin A1c was 9.7% on initial diagnosis.   Was diagnosed with neuroendocrine tumor in 2015, he is status post Lutathera , and has been on octreotide treatments since 2015. SUBJECTIVE:   During the last visit (01/17/2020): A1c 5.6%. Decrease  metformin      Today (07/20/2020): [name] is here for a follow up on diabetes management.  He checks his blood sugars 3 times daily, week. The patient has not had hypoglycemic episodes since the last clinic visit  Last treatment for neuroendocrine tumor was in 12/2019. Has a pending scan in 02/2020. He did have nausea and vomiting following the treatment but otherwise tolerating metformin well.     Denies nausea and diarrhea  Dr. Annitta Jersey for neuroendcrine tumor   HOME DIABETES REGIMEN:  Metformin 500 mg XR 1 tab daily      METER DOWNLOAD SUMMARY: Date range evaluated: 12/2-12/16/2021 Average Number Tests/Day = 0.2 Overall Mean FS Glucose = 103   BG Ranges: Low = 105 High = 101   Hypoglycemic Events/30 Days: BG < 50 = 0 Episodes of symptomatic severe hypoglycemia = 0      DIABETIC COMPLICATIONS: Microvascular complications:    Denies: CKD, retinopathy, neuropathy  Last eye exam: Completed 2020  Macrovascular complications:   S/P Aortic valve replacement.   Denies: CAD, PVD, CVA    HISTORY:  Past Medical History:  Past Medical  History:  Diagnosis Date  . Baker's cyst   . Gallstones   . Heart murmur   . Hypercholesteremia   . Hypertension   . Lesion of left lung    hx.25 yrs ago- testicilar cancer related- only scarring left after tx.-no problems now  . Liver metastasis ([place])   . 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  . Pericarditis    2nd to tumor  . Pneumonia 1990  . Primary neuroendocrine tumor of pancreas 02/14/2014   Stage IV with multiple mets to liver  . Pulmonary fibrosis ([place]) 11/08/2015  . S/P TAVR (transcatheter aortic valve replacement) 04/16/2016   26 mm Edwards Sapien 3 transcatheter heart valve placed via percutaneous right transfemoral approach   . Shortness of breath dyspnea    with exertion and when tired  . 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 Transfusion history    hx. 25 yrs ago-during cancer tx.   Past Surgical History:  Past Surgical History:  Procedure Laterality Date  . BACK SURGERY     '14- rupt. disc   . CARDIAC CATHETERIZATION N/A 01/24/2016   Procedure: Right/Left Heart Cath and Coronary Angiography;  Surgeon: [name], MD;  Location: [facility];  Service: Cardiovascular;  Laterality: N/A;  . COLONOSCOPY    . EUS N/A 02/10/2014   Procedure: UPPER ENDOSCOPIC ULTRASOUND (EUS) LINEAR;  Surgeon: [name], MD;  Location: [facility];  Service: Endoscopy;  Laterality: N/A;  . IR RADIOLOGIST EVAL & MGMT  02/11/2017  . KNEE SURGERY Right    age 53 for Baker's cyst  . NASAL SEPTUM SURGERY     Deviated septrum  . TEE WITHOUT CARDIOVERSION N/A 04/16/2016   Procedure: TRANSESOPHAGEAL ECHOCARDIOGRAM (TEE);  Surgeon: [name], MD;  Location: [facility];  Service: Open Heart Surgery;  Laterality: N/A;  . THORACOTOMY  1990   wedge biopsy of mediastinal mass  . TRANSCATHETER AORTIC VALVE REPLACEMENT,  TRANSFEMORAL N/A 04/16/2016   Procedure: TRANSCATHETER AORTIC VALVE REPLACEMENT, TRANSFEMORAL;  Surgeon: [name], MD;  Location: [facility];  Service: Open Heart Surgery;  Laterality: N/A;    Social History:  reports that he has never smoked. He has never used smokeless tobacco. He reports current alcohol use. He reports that he does not use drugs. Family History:  Family History  Problem Relation Age of Onset  . Dementia Mother   . Cancer Paternal Grandfather        esophagus  . Cancer Maternal Aunt        colon  . Emphysema Maternal Aunt      HOME MEDICATIONS: Allergies as of 07/20/2020      Reactions   Penicillins Hives   ENTIRE BODY Has patient had a PCN reaction causing immediate rash, facial/tongue/throat swelling, SOB or lightheadedness with hypotension: No Has patient had a PCN reaction causing severe rash involving mucus membranes or skin necrosis: No Has patient had a PCN reaction that required hospitalization * *  YES  * * Has patient had a PCN reaction occurring within the last 10 years: No If all of the above answers are "NO", then may proceed with Cephalosporin use. *reaction occurred when he was 17      Medication List       Accurate as of July 20, 2020 11:26 AM. If you have any questions, ask your nurse or doctor.        acetaminophen 500 MG tablet Commonly known as: TYLENOL Take 1,000 mg by mouth every 6 (six) hours as needed for headache.   albuterol 108 (90 Base) MCG/ACT inhaler Commonly known as: VENTOLIN HFA Inhale 1-2 puffs into the lungs every 6 (six) hours as needed for wheezing or shortness of breath.   Blood Pressure Cuff Misc Use as directed   calcium carbonate 500 MG chewable tablet Commonly known as: TUMS - dosed in mg elemental calcium Chew 3 tablets by mouth 3 (three) times daily as needed for indigestion or heartburn.   clindamycin 300 MG capsule Commonly known as: CLEOCIN Take 2 capsules by mouth one hour prior to dental  appointment   cyclobenzaprine 5 MG tablet Commonly known as: FLEXERIL Take 1 tablet (5 mg total) by mouth 3 (three) times daily as needed for muscle spasms.   docusate sodium 100 MG capsule Commonly known as: COLACE Take 100 mg by mouth daily as needed for mild constipation.   gabapentin 300 MG capsule Commonly known as: NEURONTIN TAKE ONE CAPSULE BY MOUTH THREE TIMES A DAY   START AFRER 100 MG INTIAL RX   ibuprofen 200 MG tablet Commonly known as: ADVIL Take 400 mg by mouth every 6 (six) hours as needed for headache or mild pain.   Lancet Device Misc Use as instructed to test blood sugar daily E11.65   losartan-hydrochlorothiazide 100-25 MG tablet Commonly known as: HYZAAR TAKE 1 TABLET BY MOUTH DAILY   metFORMIN 500  MG 24 hr tablet Commonly known as: GLUCOPHAGE-XR Take 1 tablet (500 mg total) by mouth daily with breakfast.   ondansetron 8 MG tablet Commonly known as: ZOFRAN Take 1 tablet (8 mg total) by mouth 2 (two) times daily as needed for nausea or vomiting.   OneTouch Delica Lancets 34J Misc Use as instructed to test blood sugar once daily E11.65   OneTouch Verio test strip Generic drug: glucose blood Use as instructed to test blood sugar one time daily E11.65   SANDOSTATIN IJ Inject 1 each as directed every 30 (thirty) days.   sildenafil 100 MG tablet Commonly known as: VIAGRA TAKE 1/2 TO 1 TABLET BY MOUTH DAILY AS NEEDED FOR FOR ERECTILE DYSFUNCTION   simvastatin 20 MG tablet Commonly known as: ZOCOR TAKE ONE TABLET BY MOUTH DAILY AT 6PM        OBJECTIVE:   Vital Signs: BP 112/70   Pulse 80   Ht 5\' 10"  (1.778 m)   Wt 170 lb (77.1 kg)   SpO2 94%   BMI 24.39 kg/m   Wt Readings from Last 3 Encounters:  07/20/20 170 lb (77.1 kg)  06/20/20 169 lb 6.4 oz (76.8 kg)  06/13/20 173 lb 3.2 oz (78.6 kg)     Exam: General: Pt appears well and is in NAD  Lungs: Clear with good BS bilat with no rales, rhonchi, or wheezes  Heart: RRR with normal S1  and S2 and no gallops; no murmurs; no rub  Abdomen: Normoactive bowel sounds, soft, nontender, without masses or organomegaly palpable  Extremities: No pretibial edema.   Skin: Normal texture and temperature to palpation.   Neuro: MS is good with appropriate affect, pt is alert and Ox3     DM foot exam: 01/17/2020  The skin of the feet is intact without sores or ulcerations. The pedal pulses are 2+ on right and 2+ on left. The sensation is intact to a screening 5.07, 10 gram monofilament bilaterally    DATA REVIEWED:  Lab Results  Component Value Date   HGBA1C 5.5 07/20/2020   HGBA1C 5.6 01/17/2020   HGBA1C 9.7 (H) 09/22/2019   Lab Results  Component Value Date   MICROALBUR 1.9 01/17/2020   LDLCALC 98 09/22/2019   CREATININE 1.37 (H) 07/19/2020     Lab Results  Component Value Date   CHOL 165 09/22/2019   HDL 53.50 09/22/2019   LDLCALC 98 09/22/2019   LDLDIRECT 139.2 10/10/2008   TRIG 68.0 09/22/2019   CHOLHDL 3 09/22/2019       Results for Fanfan, Mitchelle "ED" (MRN [id number]) as of 01/18/2020 07:31  Ref. Range 01/17/2020 11:53  Creatinine,U Latest Units: mg/dL 144.8  Microalb, Ur Latest Ref Range: 0.0 - 1.9 mg/dL 1.9  MICROALB/CREAT RATIO Latest Ref Range: 0.0 - 30.0 mg/g 1.3    ASSESSMENT / PLAN / RECOMMENDATIONS:   1) Type 2 Diabetes Mellitus, optimally controlled, Without complications - Most recent A1c of 5.5 %. Goal A1c < 7.0 %.    - He continues to do well with lifestyle changes  - Given an A1c in the 5's %, we have opted to stop the metformin as long as he continues with glucose checks 2-3x a week and to notify us should his fasting Bg > 150 mg/dL     MEDICATIONS:  Stop Metformin   EDUCATION / INSTRUCTIONS:  BG monitoring instructions: Patient is instructed to check his blood sugars 3 times a day, week.  Call [facility] if: BG persistently < 70 .  I reviewed the Rule of 15 for the treatment of hypoglycemia in detail with the  patient. Literature supplied.     2) Diabetic complications:   Eye: Does not have known diabetic retinopathy.   Neuro/ Feet: Does not have known diabetic peripheral neuropathy .   Renal: Patient does not have known baseline CKD. Microalbuminuria is normal    F/U in 6 months   Signed electronically by: Mack Guise, MD  [facility]  [facility] [address] [place], [place] [address] Phone: [phone] FAX: [phone]   CC: [name], [place] [address] Phone: [phone]  Fax: [phone]  Return to Endocrinology clinic as below: Future Appointments  Date Time Provider [place]  08/21/2020 10:00 AM [facility] None  09/20/2020 11:45 AM Owens Shark, NP [facility] None  09/20/2020 12:15 PM [facility] None  09/21/2020 10:20 AM [name], MD [facility] None

## 2020-07-21 ENCOUNTER — Telehealth: Payer: Self-pay

## 2020-07-21 ENCOUNTER — Encounter: Payer: Self-pay | Admitting: *Deleted

## 2020-07-21 NOTE — Progress Notes (Signed)
Routed CMP of 07/19/20 to Dr. Sherian Maroon with message that Dr. [name] suggests repeat CMP in 1 month.

## 2020-07-21 NOTE — Telephone Encounter (Signed)
Spoke with pt made aware of most recent lab results and follow up

## 2020-07-21 NOTE — Telephone Encounter (Signed)
-----   Message from Ladell Pier, MD sent at 07/21/2020  7:30 AM EST ----- Please call patient, chromogranin is stable, creatinine mildly elevated, could be related to bp med, repeat bmp next sandostatin

## 2020-08-21 ENCOUNTER — Other Ambulatory Visit: Payer: Self-pay

## 2020-08-21 ENCOUNTER — Inpatient Hospital Stay: Payer: Medicare Other | Attending: Oncology

## 2020-08-21 VITALS — BP 118/64 | HR 79 | Resp 18

## 2020-08-21 DIAGNOSIS — D3A8 Other benign neuroendocrine tumors: Secondary | ICD-10-CM

## 2020-08-21 DIAGNOSIS — C7A8 Other malignant neuroendocrine tumors: Secondary | ICD-10-CM | POA: Insufficient documentation

## 2020-08-21 DIAGNOSIS — C7B8 Other secondary neuroendocrine tumors: Secondary | ICD-10-CM | POA: Insufficient documentation

## 2020-08-21 MED ORDER — OCTREOTIDE ACETATE 30 MG IM KIT
PACK | INTRAMUSCULAR | Status: AC
  Filled 2020-08-21: qty 1

## 2020-08-21 MED ORDER — OCTREOTIDE ACETATE 30 MG IM KIT
30.0000 mg | PACK | Freq: Once | INTRAMUSCULAR | Status: AC
  Administered 2020-08-21: 30 mg via INTRAMUSCULAR

## 2020-08-21 NOTE — Patient Instructions (Signed)
Octreotide injection solution What is this medicine? OCTREOTIDE (ok TREE oh tide) is used to reduce blood levels of growth hormone in patients with a condition called acromegaly. This medicine also reduces flushing and watery diarrhea caused by certain types of cancer. This medicine may be used for other purposes; ask your health care provider or pharmacist if you have questions. COMMON BRAND NAME(S): Bynfezia, Sandostatin What should I tell my health care provider before I take this medicine? They need to know if you have any of these conditions:  diabetes  gallbladder disease  kidney disease  liver disease  thyroid disease  an unusual or allergic reaction to octreotide, other medicines, foods, dyes, or preservatives  pregnant or trying to get pregnant  breast-feeding How should I use this medicine? This medicine is for injection under the skin or into a vein (only in emergency situations). It is usually given by a health care professional in a hospital or clinic setting. If you get this medicine at home, you will be taught how to prepare and give this medicine. Allow the injection solution to come to room temperature before use. Do not warm it artificially. Use exactly as directed. Take your medicine at regular intervals. Do not take your medicine more often than directed. It is important that you put your used needles and syringes in a special sharps container. Do not put them in a trash can. If you do not have a sharps container, call your pharmacist or healthcare provider to get one. Talk to your pediatrician regarding the use of this medicine in children. Special care may be needed. Overdosage: If you think you have taken too much of this medicine contact a poison control center or emergency room at once. NOTE: This medicine is only for you. Do not share this medicine with others. What if I miss a dose? If you miss a dose, take it as soon as you can. If it is almost time for your  next dose, take only that dose. Do not take double or extra doses. What may interact with this medicine?  bromocriptine  certain medicines for blood pressure, heart disease, irregular heartbeat  cyclosporine  diuretics  medicines for diabetes, including insulin  quinidine This list may not describe all possible interactions. Give your health care provider a list of all the medicines, herbs, non-prescription drugs, or dietary supplements you use. Also tell them if you smoke, drink alcohol, or use illegal drugs. Some items may interact with your medicine. What should I watch for while using this medicine? Visit your doctor or health care professional for regular checks on your progress. To help reduce irritation at the injection site, use a different site for each injection and make sure the solution is at room temperature before use. This medicine may cause decreases in blood sugar. Signs of low blood sugar include chills, cool, pale skin or cold sweats, drowsiness, extreme hunger, fast heartbeat, headache, nausea, nervousness or anxiety, shakiness, trembling, unsteadiness, tiredness, or weakness. Contact your doctor or health care professional right away if you experience any of these symptoms. This medicine may increase blood sugar. Ask your healthcare provider if changes in diet or medicines are needed if you have diabetes. This medicine may cause a decrease in vitamin B12. You should make sure that you get enough vitamin B12 while you are taking this medicine. Discuss the foods you eat and the vitamins you take with your health care professional. What side effects may I notice from receiving this medicine? Side   effects that you should report to your doctor or health care professional as soon as possible:  allergic reactions like skin rash, itching or hives, swelling of the face, lips, or tongue  fast, slow, or irregular heartbeat  right upper belly pain  severe stomach pain  signs  and symptoms of high blood sugar such as being more thirsty or hungry or having to urinate more than normal. You may also feel very tired or have blurry vision.  signs and symptoms of low blood sugar such as feeling anxious; confusion; dizziness; increased hunger; unusually weak or tired; increased sweating; shakiness; cold, clammy skin; irritable; headache; blurred vision; fast heartbeat; loss of consciousness  unusually weak or tired Side effects that usually do not require medical attention (report to your doctor or health care professional if they continue or are bothersome):  diarrhea  dizziness  gas  headache  nausea, vomiting  pain, redness, or irritation at site where injected  upset stomach This list may not describe all possible side effects. Call your doctor for medical advice about side effects. You may report side effects to FDA at 1-800-FDA-1088. Where should I keep my medicine? Keep out of the reach of children. Store in a refrigerator between 2 and 8 degrees C (36 and 46 degrees F). Protect from light. Allow to come to room temperature naturally. Do not use artificial heat. If protected from light, the injection may be stored at room temperature between 20 and 30 degrees C (70 and 86 degrees F) for 14 days. After the initial use, throw away any unused portion of a multiple dose vial after 14 days. Throw away unused portions of the ampules after use. NOTE: This sheet is a summary. It may not cover all possible information. If you have questions about this medicine, talk to your doctor, pharmacist, or health care provider.  2021 Elsevier/Gold Standard (2019-02-18 13:33:09)  

## 2020-08-22 ENCOUNTER — Other Ambulatory Visit: Payer: Self-pay | Admitting: Internal Medicine

## 2020-08-22 NOTE — Telephone Encounter (Signed)
Please refill as per office routine med refill policy (all routine meds refilled for 3 mo or monthly per pt preference up to one year from last visit, then month to month grace period for 3 mo, then further med refills will have to be denied)  

## 2020-08-29 ENCOUNTER — Telehealth: Payer: Self-pay | Admitting: *Deleted

## 2020-08-29 NOTE — Telephone Encounter (Signed)
Called patient to follow up if he has had repeat CMP to evaluate elevated creatinine here in December. Sent message to PCP suggesting recheck 1 month from 07/19/20. He has not heard from PCP yet.  Called PCP office and spoke w/Shaniya,CMA--she will forward this information to Dr. Jenny Reichmann and they will contact patient.

## 2020-08-29 NOTE — Telephone Encounter (Signed)
Pt last seen feb 2021  Please make rov

## 2020-09-20 ENCOUNTER — Encounter: Payer: Self-pay | Admitting: Nurse Practitioner

## 2020-09-20 ENCOUNTER — Other Ambulatory Visit: Payer: Self-pay

## 2020-09-20 ENCOUNTER — Inpatient Hospital Stay: Payer: Medicare Other | Attending: Nurse Practitioner

## 2020-09-20 ENCOUNTER — Inpatient Hospital Stay (HOSPITAL_BASED_OUTPATIENT_CLINIC_OR_DEPARTMENT_OTHER): Payer: Medicare Other | Admitting: Nurse Practitioner

## 2020-09-20 VITALS — BP 116/71 | HR 89 | Temp 97.7°F | Resp 13 | Ht 70.0 in | Wt 169.7 lb

## 2020-09-20 DIAGNOSIS — D3A8 Other benign neuroendocrine tumors: Secondary | ICD-10-CM

## 2020-09-20 DIAGNOSIS — C7B8 Other secondary neuroendocrine tumors: Secondary | ICD-10-CM | POA: Insufficient documentation

## 2020-09-20 DIAGNOSIS — C7A8 Other malignant neuroendocrine tumors: Secondary | ICD-10-CM | POA: Diagnosis not present

## 2020-09-20 MED ORDER — OCTREOTIDE ACETATE 30 MG IM KIT
PACK | INTRAMUSCULAR | Status: AC
  Filled 2020-09-20: qty 1

## 2020-09-20 MED ORDER — OCTREOTIDE ACETATE 30 MG IM KIT
30.0000 mg | PACK | Freq: Once | INTRAMUSCULAR | Status: AC
  Administered 2020-09-20: 30 mg via INTRAMUSCULAR

## 2020-09-20 NOTE — Patient Instructions (Signed)
Octreotide injection solution What is this medicine? OCTREOTIDE (ok TREE oh tide) is used to reduce blood levels of growth hormone in patients with a condition called acromegaly. This medicine also reduces flushing and watery diarrhea caused by certain types of cancer. This medicine may be used for other purposes; ask your health care provider or pharmacist if you have questions. COMMON BRAND NAME(S): Bynfezia, Sandostatin What should I tell my health care provider before I take this medicine? They need to know if you have any of these conditions:  diabetes  gallbladder disease  kidney disease  liver disease  thyroid disease  an unusual or allergic reaction to octreotide, other medicines, foods, dyes, or preservatives  pregnant or trying to get pregnant  breast-feeding How should I use this medicine? This medicine is for injection under the skin or into a vein (only in emergency situations). It is usually given by a health care professional in a hospital or clinic setting. If you get this medicine at home, you will be taught how to prepare and give this medicine. Allow the injection solution to come to room temperature before use. Do not warm it artificially. Use exactly as directed. Take your medicine at regular intervals. Do not take your medicine more often than directed. It is important that you put your used needles and syringes in a special sharps container. Do not put them in a trash can. If you do not have a sharps container, call your pharmacist or healthcare provider to get one. Talk to your pediatrician regarding the use of this medicine in children. Special care may be needed. Overdosage: If you think you have taken too much of this medicine contact a poison control center or emergency room at once. NOTE: This medicine is only for you. Do not share this medicine with others. What if I miss a dose? If you miss a dose, take it as soon as you can. If it is almost time for your  next dose, take only that dose. Do not take double or extra doses. What may interact with this medicine?  bromocriptine  certain medicines for blood pressure, heart disease, irregular heartbeat  cyclosporine  diuretics  medicines for diabetes, including insulin  quinidine This list may not describe all possible interactions. Give your health care provider a list of all the medicines, herbs, non-prescription drugs, or dietary supplements you use. Also tell them if you smoke, drink alcohol, or use illegal drugs. Some items may interact with your medicine. What should I watch for while using this medicine? Visit your doctor or health care professional for regular checks on your progress. To help reduce irritation at the injection site, use a different site for each injection and make sure the solution is at room temperature before use. This medicine may cause decreases in blood sugar. Signs of low blood sugar include chills, cool, pale skin or cold sweats, drowsiness, extreme hunger, fast heartbeat, headache, nausea, nervousness or anxiety, shakiness, trembling, unsteadiness, tiredness, or weakness. Contact your doctor or health care professional right away if you experience any of these symptoms. This medicine may increase blood sugar. Ask your healthcare provider if changes in diet or medicines are needed if you have diabetes. This medicine may cause a decrease in vitamin B12. You should make sure that you get enough vitamin B12 while you are taking this medicine. Discuss the foods you eat and the vitamins you take with your health care professional. What side effects may I notice from receiving this medicine? Side   effects that you should report to your doctor or health care professional as soon as possible:  allergic reactions like skin rash, itching or hives, swelling of the face, lips, or tongue  fast, slow, or irregular heartbeat  right upper belly pain  severe stomach pain  signs  and symptoms of high blood sugar such as being more thirsty or hungry or having to urinate more than normal. You may also feel very tired or have blurry vision.  signs and symptoms of low blood sugar such as feeling anxious; confusion; dizziness; increased hunger; unusually weak or tired; increased sweating; shakiness; cold, clammy skin; irritable; headache; blurred vision; fast heartbeat; loss of consciousness  unusually weak or tired Side effects that usually do not require medical attention (report to your doctor or health care professional if they continue or are bothersome):  diarrhea  dizziness  gas  headache  nausea, vomiting  pain, redness, or irritation at site where injected  upset stomach This list may not describe all possible side effects. Call your doctor for medical advice about side effects. You may report side effects to FDA at 1-800-FDA-1088. Where should I keep my medicine? Keep out of the reach of children. Store in a refrigerator between 2 and 8 degrees C (36 and 46 degrees F). Protect from light. Allow to come to room temperature naturally. Do not use artificial heat. If protected from light, the injection may be stored at room temperature between 20 and 30 degrees C (70 and 86 degrees F) for 14 days. After the initial use, throw away any unused portion of a multiple dose vial after 14 days. Throw away unused portions of the ampules after use. NOTE: This sheet is a summary. It may not cover all possible information. If you have questions about this medicine, talk to your doctor, pharmacist, or health care provider.  2021 Elsevier/Gold Standard (2019-02-18 13:33:09)  

## 2020-09-20 NOTE — Progress Notes (Signed)
[place] OFFICE PROGRESS NOTE   Diagnosis: Pancreas neuroendocrine tumor  INTERVAL HISTORY:   [name] returns as scheduled.  He continues monthly Sandostatin.  He feels well.  No complaints.  No diarrhea or flushing episodes.  He denies abdominal pain.  He is having normal bowel movements.  He reports a good appetite.  No nausea or vomiting.  Objective:  Vital signs in last 24 hours:  Blood pressure 116/71, pulse 89, temperature 97.7 F (36.5 C), temperature source Tympanic, resp. rate 13, height 5\' 10"  (1.778 m), weight 169 lb 11.2 oz (77 kg), SpO2 98 %.    Lymphatics: No palpable cervical, supraclavicular or axillary lymph nodes. Resp: Lungs clear bilaterally. Cardio: Regular rate and rhythm. GI: Abdomen soft and nontender.  No hepatosplenomegaly.  Soft cutaneous mass in the suprapubic region. Vascular: No leg edema.  Lab Results:  Lab Results  Component Value Date   WBC 3.7 (L) 07/19/2020   HGB 14.0 07/19/2020   HCT 41.5 07/19/2020   MCV 100.7 (H) 07/19/2020   PLT 115 (L) 07/19/2020   NEUTROABS 2.7 07/19/2020    Imaging:  No results found.  Medications: I have reviewed the patient's current medications.  Assessment/Plan: 1. Pancreatic neuroendocrine tumor, WHO grade 2, pancreatic head mass and small peripancreatic/celiac nodes on a CT 02/04/2014  EUS revealed evidence of multiple liver metastases-status post an FNA biopsy of a left liver lesion 02/10/2014 confirming a neuroendocrine tumor   Octreotide scan 04/01/2014 with multiple foci of metastatic neuroendocrine tumor in the liver  Monthly Sandostatin started 03/16/2014  Restaging CT 07/04/2014 with resolution of a previously noted pancreas head lesion and probable progression of liver metastases  Restaging CT 10/31/2014-stable liver lesions, no evidence of a pancreas mass, stable.  Restaging CT 02/28/2015-stable hepatic metastases, stable pancreas lesion unchanged  Restaging CT  08/30/2015-stable pancreas mass, slight enlargement of hepatic metastases  Monthly Sandostatin continued  Restaging CTs06/14/2017 revealed enlargement of liver lesions, no new lesions, enlargement of the pancreas head mass  Gallium DOTATATEscan 10/01/2016 confirmed uptake in the liver metastases, pancreas primary, peripancreatic adenopathy, and left iliac bone  Cycle 1 Lutathera 03/05/2017  Cycle 2Lutathera09/26/2018  Cycle 3 Lutathera 07/02/2017  Cycle4Lutathera 08/27/2017  Netspotscan 09/29/2017-decreased radiotracer activity within the pancreas head,peripancreaticlymph node, and solitary skeletal lesion. Liver lesions have increased in size with a decrease in radiotracer activity  Monthly Sandostatin continued  Netspot scan 05/20/2018-stable to decreased size of liver lesions, most have decreased SUV, similar uptake in the pancreas lesion, previously noted peripancreatic lymph node has resolved him a mild decrease in uptake associated with a left iliac metastasis, no evidence of disease progression  Monthly Sandostatin continued  Netspot scan 01/29/2019-overall stable to improved, 1 lesion left hepatic lobe has increased tracer activity-unchanged in size, majority of hepatic lesions have reduced activity compared to the pretreatment scan, decreased activity left iliac bone lesion, stable activity in the head of the pancreas lesion, no new lesions  Monthly Sandostatin continued  Netspot on September 24, 2019-increase in size of 3 liver lesions with associated stable radiotracer activity, no new lesions, stable small left iliac lesion  Cycle 1 salvage Lutathera 10/27/2019  Cycle 2 salvage Lutathera 12/22/2019  Netspot on 02/15/2020-decrease in radiotracer activity in the liver metastases with a decrease in size of several lesions, no new lesions, no progressive disease in the bowel or mesentery, decreased activity in the left iliac bone lesion  Monthly Sandostatin  continued  2. Chest Seminoma in 1990 treated with BEP and chest radiation at  [facility] 3. 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 chronic exertional dyspnea following treatment for the seminoma  5. aortic stenosis on an echocardiogram December 2011, severe aortic stenosis on echocardiogram 12/06/2015, status postTAVR on 04/16/2016 6. lumbar disc surgery 2014  7. acute abdominal pain 02/04/2014-resolved  8.Diabetes   Disposition: [name] appears well.  There is no clinical evidence of disease progression.  Plan to continue monthly Sandostatin.  Chromogranin A level and restaging Netspot in approximately 3 weeks.  He will return for an office visit in 1 month.  Plan reviewed with Dr. [name].    Ned Card ANP/GNP-BC   09/20/2020  12:07 PM

## 2020-09-21 ENCOUNTER — Telehealth: Payer: Self-pay | Admitting: Nurse Practitioner

## 2020-09-21 ENCOUNTER — Encounter: Payer: Medicare Other | Admitting: Internal Medicine

## 2020-09-21 NOTE — Telephone Encounter (Signed)
Scheduled appointments per 2/16 los. Spoke to patient who is aware of appointments date and times.

## 2020-10-11 ENCOUNTER — Other Ambulatory Visit: Payer: Self-pay

## 2020-10-11 ENCOUNTER — Inpatient Hospital Stay: Payer: Medicare Other | Attending: Oncology

## 2020-10-11 DIAGNOSIS — C7A8 Other malignant neuroendocrine tumors: Secondary | ICD-10-CM | POA: Insufficient documentation

## 2020-10-11 DIAGNOSIS — C7B8 Other secondary neuroendocrine tumors: Secondary | ICD-10-CM | POA: Diagnosis not present

## 2020-10-11 DIAGNOSIS — D3A8 Other benign neuroendocrine tumors: Secondary | ICD-10-CM

## 2020-10-11 LAB — CMP (CANCER CENTER ONLY)
ALT: 25 U/L (ref 0–44)
AST: 31 U/L (ref 15–41)
Albumin: 4.6 g/dL (ref 3.5–5.0)
Alkaline Phosphatase: 68 U/L (ref 38–126)
Anion gap: 8 (ref 5–15)
BUN: 22 mg/dL (ref 8–23)
CO2: 31 mmol/L (ref 22–32)
Calcium: 10 mg/dL (ref 8.9–10.3)
Chloride: 99 mmol/L (ref 98–111)
Creatinine: 1.38 mg/dL — ABNORMAL HIGH (ref 0.61–1.24)
GFR, Estimated: 56 mL/min — ABNORMAL LOW (ref >60)
Glucose, Bld: 150 mg/dL — ABNORMAL HIGH (ref 70–99)
Potassium: 4.9 mmol/L (ref 3.5–5.1)
Sodium: 138 mmol/L (ref 135–145)
Total Bilirubin: 1.1 mg/dL (ref 0.3–1.2)
Total Protein: 7.6 g/dL (ref 6.5–8.1)

## 2020-10-11 LAB — CBC WITH DIFFERENTIAL (CANCER CENTER ONLY)
Abs Immature Granulocytes: 0.01 10*3/uL (ref 0.00–0.07)
Basophils Absolute: 0 10*3/uL (ref 0.0–0.1)
Basophils Relative: 0 %
Eosinophils Absolute: 0.1 10*3/uL (ref 0.0–0.5)
Eosinophils Relative: 1 %
HCT: 42.6 % (ref 39.0–52.0)
Hemoglobin: 14.7 g/dL (ref 13.0–17.0)
Immature Granulocytes: 0 %
Lymphocytes Relative: 9 %
Lymphs Abs: 0.4 10*3/uL — ABNORMAL LOW (ref 0.7–4.0)
MCH: 33.7 pg (ref 26.0–34.0)
MCHC: 34.5 g/dL (ref 30.0–36.0)
MCV: 97.7 fL (ref 80.0–100.0)
Monocytes Absolute: 0.4 10*3/uL (ref 0.1–1.0)
Monocytes Relative: 10 %
Neutro Abs: 3.4 10*3/uL (ref 1.7–7.7)
Neutrophils Relative %: 80 %
Platelet Count: 126 10*3/uL — ABNORMAL LOW (ref 150–400)
RBC: 4.36 MIL/uL (ref 4.22–5.81)
RDW: 11.9 % (ref 11.5–15.5)
WBC Count: 4.3 10*3/uL (ref 4.0–10.5)
nRBC: 0 % (ref 0.0–0.2)

## 2020-10-12 LAB — CHROMOGRANIN A: Chromogranin A (ng/mL): 4973 ng/mL — ABNORMAL HIGH (ref 0.0–101.8)

## 2020-10-13 ENCOUNTER — Other Ambulatory Visit: Payer: Self-pay

## 2020-10-13 ENCOUNTER — Ambulatory Visit (HOSPITAL_COMMUNITY)
Admission: RE | Admit: 2020-10-13 | Discharge: 2020-10-13 | Disposition: A | Discharge status: Home / Self Care | Payer: Medicare Other | Source: Ambulatory Visit | Attending: Nurse Practitioner | Admitting: Nurse Practitioner

## 2020-10-13 DIAGNOSIS — K769 Liver disease, unspecified: Secondary | ICD-10-CM | POA: Insufficient documentation

## 2020-10-13 DIAGNOSIS — D3A8 Other benign neuroendocrine tumors: Secondary | ICD-10-CM | POA: Diagnosis not present

## 2020-10-13 DIAGNOSIS — K7689 Other specified diseases of liver: Secondary | ICD-10-CM | POA: Diagnosis not present

## 2020-10-13 DIAGNOSIS — C7A8 Other malignant neuroendocrine tumors: Secondary | ICD-10-CM | POA: Diagnosis not present

## 2020-10-13 DIAGNOSIS — C787 Secondary malignant neoplasm of liver and intrahepatic bile duct: Secondary | ICD-10-CM | POA: Diagnosis not present

## 2020-10-13 MED ORDER — GALLIUM GA 68 DOTATATE IV KIT
3.9000 | PACK | Freq: Once | INTRAVENOUS | Status: AC | PRN
  Administered 2020-10-13: 3.9 via INTRAVENOUS

## 2020-10-17 ENCOUNTER — Inpatient Hospital Stay: Payer: Medicare Other

## 2020-10-17 ENCOUNTER — Telehealth: Payer: Self-pay | Admitting: Oncology

## 2020-10-17 ENCOUNTER — Ambulatory Visit: Payer: Medicare Other

## 2020-10-17 ENCOUNTER — Inpatient Hospital Stay (HOSPITAL_BASED_OUTPATIENT_CLINIC_OR_DEPARTMENT_OTHER): Payer: Medicare Other | Admitting: Oncology

## 2020-10-17 ENCOUNTER — Other Ambulatory Visit: Payer: Self-pay

## 2020-10-17 ENCOUNTER — Ambulatory Visit: Payer: Medicare Other | Admitting: Oncology

## 2020-10-17 VITALS — BP 106/73 | HR 82 | Temp 97.8°F | Resp 16 | Ht 70.0 in | Wt 169.1 lb

## 2020-10-17 DIAGNOSIS — C7B8 Other secondary neuroendocrine tumors: Secondary | ICD-10-CM | POA: Diagnosis not present

## 2020-10-17 DIAGNOSIS — D3A8 Other benign neuroendocrine tumors: Secondary | ICD-10-CM

## 2020-10-17 DIAGNOSIS — C7A8 Other malignant neuroendocrine tumors: Secondary | ICD-10-CM | POA: Diagnosis not present

## 2020-10-17 MED ORDER — OCTREOTIDE ACETATE 30 MG IM KIT
30.0000 mg | PACK | Freq: Once | INTRAMUSCULAR | Status: AC
  Administered 2020-10-17: 30 mg via INTRAMUSCULAR

## 2020-10-17 MED ORDER — OCTREOTIDE ACETATE 30 MG IM KIT
PACK | INTRAMUSCULAR | Status: AC
  Filled 2020-10-17: qty 1

## 2020-10-17 NOTE — Telephone Encounter (Signed)
Scheduled appt per 3/15 LOS - pt is aware - printed AVS and calender for pt.

## 2020-10-17 NOTE — Progress Notes (Signed)
[place] OFFICE PROGRESS NOTE   Diagnosis: Pancreas neuroendocrine tumor  INTERVAL HISTORY:   Ronald Thompson returns as scheduled.  He feels well.  No diarrhea or flushing.  Good appetite.  He continues monthly Sandostatin.  Objective:  Vital signs in last 24 hours:  Blood pressure 106/73, pulse 82, temperature 97.8 F (36.6 C), temperature source Tympanic, resp. rate 16, height 5\' 10"  (1.778 m), weight 169 lb 1.6 oz (76.7 kg), SpO2 97 %.    Resp: Lungs clear bilaterally Cardio: Regular rate and rhythm GI: No hepatosplenomegaly, no mass, nontender Vascular: No leg edema   Lab Results:  Lab Results  Component Value Date   WBC 4.3 10/11/2020   HGB 14.7 10/11/2020   HCT 42.6 10/11/2020   MCV 97.7 10/11/2020   PLT 126 (L) 10/11/2020   NEUTROABS 3.4 10/11/2020    CMP  Lab Results  Component Value Date   NA 138 10/11/2020   K 4.9 10/11/2020   CL 99 10/11/2020   CO2 31 10/11/2020   GLUCOSE 150 (H) 10/11/2020   BUN 22 10/11/2020   CREATININE 1.38 (H) 10/11/2020   CALCIUM 10.0 10/11/2020   PROT 7.6 10/11/2020   ALBUMIN 4.6 10/11/2020   AST 31 10/11/2020   ALT 25 10/11/2020   ALKPHOS 68 10/11/2020   BILITOT 1.1 10/11/2020   GFRNONAA 56 (L) 10/11/2020   GFRAA >60 01/19/2020   Chromogranin A on 10/11/2020-4973   Imaging:  Netspot images from 10/13/2020 reviewed with Ronald Thompson Medications: I have reviewed the patient's current medications.   Assessment/Plan: 1. Pancreatic neuroendocrine tumor, WHO grade 2, pancreatic head mass and small peripancreatic/celiac nodes on a CT 02/04/2014  EUS revealed evidence of multiple liver metastases-status post an FNA biopsy of a left liver lesion 02/10/2014 confirming a neuroendocrine tumor   Octreotide scan 04/01/2014 with multiple foci of metastatic neuroendocrine tumor in the liver  Monthly Sandostatin started 03/16/2014  Restaging CT 07/04/2014 with resolution of a previously noted pancreas head lesion and  probable progression of liver metastases  Restaging CT 10/31/2014-stable liver lesions, no evidence of a pancreas mass, stable.  Restaging CT 02/28/2015-stable hepatic metastases, stable pancreas lesion unchanged  Restaging CT 08/30/2015-stable pancreas mass, slight enlargement of hepatic metastases  Monthly Sandostatin continued  Restaging CTs06/14/2017 revealed enlargement of liver lesions, no new lesions, enlargement of the pancreas head mass  Gallium DOTATATEscan 10/01/2016 confirmed uptake in the liver metastases, pancreas primary, peripancreatic adenopathy, and left iliac bone  Cycle 1 Lutathera 03/05/2017  Cycle 2Lutathera09/26/2018  Cycle 3 Lutathera 07/02/2017  Cycle4Lutathera 08/27/2017  Netspotscan 09/29/2017-decreased radiotracer activity within the pancreas head,peripancreaticlymph node, and solitary skeletal lesion. Liver lesions have increased in size with a decrease in radiotracer activity  Monthly Sandostatin continued  Netspot scan 05/20/2018-stable to decreased size of liver lesions, most have decreased SUV, similar uptake in the pancreas lesion, previously noted peripancreatic lymph node has resolved him a mild decrease in uptake associated with a left iliac metastasis, no evidence of disease progression  Monthly Sandostatin continued  Netspot scan 01/29/2019-overall stable to improved, 1 lesion left hepatic lobe has increased tracer activity-unchanged in size, majority of hepatic lesions have reduced activity compared to the pretreatment scan, decreased activity left iliac bone lesion, stable activity in the head of the pancreas lesion, no new lesions  Monthly Sandostatin continued  Netspot on September 24, 2019-increase in size of 3 liver lesions with associated stable radiotracer activity, no new lesions, stable small left iliac lesion  Cycle 1 salvage Lutathera 10/27/2019  Cycle  2 salvage Lutathera 12/22/2019  Netspot on 02/15/2020-decrease in  radiotracer activity in the liver metastases with a decrease in size of several lesions, no new lesions, no progressive disease in the bowel or mesentery, decreased activity in the left iliac bone lesion  Monthly Sandostatin continued   Netspot 10/13/2020-decrease in radiotracer activity in the left and right liver, decreased size of liver lesions, one lesion in the superior right liver has increased in size and has no radiotracer activity, no evidence of metastatic disease outside the liver  2. Chest Seminoma in 1990 treated with BEP and chest [facility] at [facility] 3. 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 chronic exertional dyspnea following treatment for the seminoma  5. aortic stenosis on an echocardiogram December 2011, severe aortic stenosis on echocardiogram 12/06/2015, status postTAVR on 04/16/2016 6. lumbar disc surgery 2014  7. acute abdominal pain 02/04/2014-resolved  8.Diabetes    Disposition:  [name] appears unchanged.  I reviewed the Netspot images with him.  Many of the liver lesions appear smaller.  One lesion in the right liver is larger and the chromogranin A is higher.  We decided to continue monthly Sandostatin.  I will review the images and discuss the case with Dr. Ilda Foil.  We will decide on the indication for additional imaging.  I discussed potential salvage systemic options with Ronald Thompson.  We will consider a referral back to Dr. Leamon Arnt within the next few months.  Betsy Coder, MD  10/17/2020  10:12 AM

## 2020-10-18 ENCOUNTER — Other Ambulatory Visit: Payer: Self-pay

## 2020-10-19 ENCOUNTER — Encounter: Payer: Self-pay | Admitting: Internal Medicine

## 2020-10-19 ENCOUNTER — Ambulatory Visit (INDEPENDENT_AMBULATORY_CARE_PROVIDER_SITE_OTHER): Payer: Medicare Other | Admitting: Internal Medicine

## 2020-10-19 VITALS — BP 124/76 | HR 82 | Temp 97.8°F | Ht 70.0 in | Wt 168.0 lb

## 2020-10-19 DIAGNOSIS — N1831 Chronic kidney disease, stage 3a: Secondary | ICD-10-CM | POA: Diagnosis not present

## 2020-10-19 DIAGNOSIS — Z23 Encounter for immunization: Secondary | ICD-10-CM

## 2020-10-19 DIAGNOSIS — E785 Hyperlipidemia, unspecified: Secondary | ICD-10-CM | POA: Diagnosis not present

## 2020-10-19 DIAGNOSIS — R739 Hyperglycemia, unspecified: Secondary | ICD-10-CM | POA: Diagnosis not present

## 2020-10-19 DIAGNOSIS — E538 Deficiency of other specified B group vitamins: Secondary | ICD-10-CM

## 2020-10-19 DIAGNOSIS — E559 Vitamin D deficiency, unspecified: Secondary | ICD-10-CM | POA: Diagnosis not present

## 2020-10-19 DIAGNOSIS — R972 Elevated prostate specific antigen [PSA]: Secondary | ICD-10-CM | POA: Diagnosis not present

## 2020-10-19 DIAGNOSIS — N183 Chronic kidney disease, stage 3 unspecified: Secondary | ICD-10-CM | POA: Insufficient documentation

## 2020-10-19 DIAGNOSIS — I10 Essential (primary) hypertension: Secondary | ICD-10-CM | POA: Diagnosis not present

## 2020-10-19 LAB — LIPID PANEL
Cholesterol: 136 mg/dL (ref 0–200)
HDL: 51.1 mg/dL (ref >39.00)
LDL Cholesterol: 73 mg/dL (ref 0–99)
NonHDL: 84.83
Total CHOL/HDL Ratio: 3
Triglycerides: 60 mg/dL (ref 0.0–149.0)
VLDL: 12 mg/dL (ref 0.0–40.0)

## 2020-10-19 LAB — TSH: TSH: 1.1 u[IU]/mL (ref 0.35–4.50)

## 2020-10-19 LAB — PSA: PSA: 1.88 ng/mL (ref 0.10–4.00)

## 2020-10-19 LAB — HEMOGLOBIN A1C: Hgb A1c MFr Bld: 6.1 % (ref 4.6–6.5)

## 2020-10-19 LAB — VITAMIN D 25 HYDROXY (VIT D DEFICIENCY, FRACTURES): VITD: 89.44 ng/mL (ref 30.00–100.00)

## 2020-10-19 LAB — VITAMIN B12: Vitamin B-12: 378 pg/mL (ref 211–911)

## 2020-10-19 NOTE — Progress Notes (Signed)
Patient ID: Ronald Thompson, male   DOB: [birth year], 65 y.o.   MRN: [id number]         Chief Complaint:: yearly exam and hyperglycemia, CKD       HPI:  Ronald Thompson is a 65 y.o. male here for yearly exam; due for pneumovax, and plans to call for optho exam soon; o/w up to date with preventive referrals and immunizations                        Also Pt denies chest pain, increased sob or doe, wheezing, orthopnea, PND, increased LE swelling, palpitations, dizziness or syncope.  Denies worsening new neuro focal s/s.   Pt denies fever, wt loss, night sweats, loss of appetite, or other constitutional symptoms  Conts to follow with oncology.   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  No other new complaints   Wt Readings from Last 3 Encounters:  10/19/20 168 lb (76.2 kg)  10/17/20 169 lb 1.6 oz (76.7 kg)  09/20/20 169 lb 11.2 oz (77 kg)   BP Readings from Last 3 Encounters:  10/19/20 124/76  10/17/20 106/73  09/20/20 116/71   Immunization History  Administered Date(s) Administered  . Fluad Quad(high Dose 65+) 06/09/2019, 06/20/2020  . Influenza Whole 06/09/2010, 04/26/2012  . Influenza,inj,Quad PF,6+ Mos 04/13/2014, 04/26/2016, 05/14/2017  . Influenza-Unspecified 05/30/2015, 04/05/2018  . PFIZER(Purple Top)SARS-COV-2 Vaccination 10/20/2019, 11/10/2019, 07/19/2020  . Pneumococcal Conjugate-13 09/22/2019  . Pneumococcal Polysaccharide-23 10/19/2020  . Td 06/09/2010  . Tdap 11/18/2011  . Zoster Recombinat (Shingrix) 03/05/2018, 05/07/2018, 07/18/2018   There are no preventive care reminders to display for this patient.    Past Medical History:  Diagnosis Date  . Baker's cyst   . Gallstones   . Heart murmur   . Hypercholesteremia   . Hypertension   . Lesion of left lung    hx.25 yrs ago- testicilar cancer related- only scarring left after tx.-no  problems now  . Liver metastasis ([place])   . 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  . Pericarditis    2nd to tumor  . Pneumonia 1990  . Primary neuroendocrine tumor of pancreas 02/14/2014   Stage IV with multiple mets to liver  . Pulmonary fibrosis ([place]) 11/08/2015  . S/P TAVR (transcatheter aortic valve replacement) 04/16/2016   26 mm Edwards Sapien 3 transcatheter heart valve placed via percutaneous right transfemoral approach   . Shortness of breath dyspnea    with exertion and when tired  . 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 Transfusion history    hx. 25 yrs ago-during cancer tx.   Past Surgical History:  Procedure Laterality Date  . BACK SURGERY     '14- rupt. disc   . CARDIAC CATHETERIZATION N/A 01/24/2016   Procedure: Right/Left Heart Cath and Coronary Angiography;  Surgeon: [name], MD;  Location: [facility];  Service: Cardiovascular;  Laterality: N/A;  . COLONOSCOPY    . EUS N/A 02/10/2014   Procedure: UPPER ENDOSCOPIC ULTRASOUND (EUS) LINEAR;  Surgeon: [name], MD;  Location: [facility];  Service: Endoscopy;  Laterality: N/A;  . IR RADIOLOGIST EVAL & MGMT  02/11/2017  . KNEE SURGERY Right  age 82 for Baker's cyst  . NASAL SEPTUM SURGERY     Deviated septrum  . TEE WITHOUT CARDIOVERSION N/A 04/16/2016   Procedure: TRANSESOPHAGEAL ECHOCARDIOGRAM (TEE);  Surgeon: [name], MD;  Location: [facility];  Service: Open Heart Surgery;  Laterality: N/A;  . THORACOTOMY  1990   wedge biopsy of mediastinal mass  . TRANSCATHETER AORTIC VALVE REPLACEMENT, TRANSFEMORAL N/A 04/16/2016   Procedure: TRANSCATHETER AORTIC VALVE REPLACEMENT, TRANSFEMORAL;  Surgeon: [name], MD;  Location: [facility];  Service: Open Heart Surgery;  Laterality: N/A;    reports that he has never smoked. He has never used  smokeless tobacco. He reports current alcohol use. He reports that he does not use drugs. family history includes Cancer in his maternal aunt and paternal grandfather; Dementia in his mother; Emphysema in his maternal aunt. Allergies  Allergen Reactions  . Penicillins Hives    ENTIRE BODY Has patient had a PCN reaction causing immediate rash, facial/tongue/throat swelling, SOB or lightheadedness with hypotension: No Has patient had a PCN reaction causing severe rash involving mucus membranes or skin necrosis: No Has patient had a PCN reaction that required hospitalization * *  YES  * * Has patient had a PCN reaction occurring within the last 10 years: No If all of the above answers are "NO", then may proceed with Cephalosporin use. *reaction occurred when he was 17   Current Outpatient Medications on File Prior to Visit  Medication Sig Dispense Refill  . acetaminophen (TYLENOL) 500 MG tablet Take 1,000 mg by mouth every 6 (six) hours as needed for headache.    . albuterol (PROVENTIL HFA;VENTOLIN HFA) 108 (90 Base) MCG/ACT inhaler Inhale 1-2 puffs into the lungs every 6 (six) hours as needed for wheezing or shortness of breath. 1 Inhaler 0  . Blood Pressure Monitoring (BLOOD PRESSURE CUFF) MISC Use as directed 1 each 0  . calcium carbonate (TUMS - DOSED IN MG ELEMENTAL CALCIUM) 500 MG chewable tablet Chew 3 tablets by mouth 3 (three) times daily as needed for indigestion or heartburn.     . clindamycin (CLEOCIN) 300 MG capsule Take 2 capsules by mouth one hour prior to dental appointment 6 capsule 2  . cyclobenzaprine (FLEXERIL) 5 MG tablet Take 1 tablet (5 mg total) by mouth 3 (three) times daily as needed for muscle spasms. 40 tablet 0  . docusate sodium (COLACE) 100 MG capsule Take 100 mg by mouth daily as needed for mild constipation.    . gabapentin (NEURONTIN) 300 MG capsule TAKE ONE CAPSULE BY MOUTH THREE TIMES A DAY   START AFRER 100 MG INTIAL RX 90 capsule 1  . glucose blood (ONETOUCH  VERIO) test strip Use as instructed to test blood sugar one time daily E11.65 100 each 12  . ibuprofen (ADVIL,MOTRIN) 200 MG tablet Take 400 mg by mouth every 6 (six) hours as needed for headache or mild pain.     Elmore Guise Device MISC Use as instructed to test blood sugar daily E11.65 1 each 2  . losartan-hydrochlorothiazide (HYZAAR) 100-25 MG tablet TAKE ONE TABLET BY MOUTH DAILY 90 tablet 2  . Octreotide Acetate (SANDOSTATIN IJ) Inject 1 each as directed every 30 (thirty) days.     . ondansetron (ZOFRAN) 8 MG tablet Take 1 tablet (8 mg total) by mouth 2 (two) times daily as needed for nausea or vomiting. 20 tablet 0  . OneTouch Delica Lancets 73X MISC Use as instructed to test blood sugar once daily E11.65 150 each 6  . sildenafil (  VIAGRA) 100 MG tablet TAKE 1/2 TO 1 TABLET BY MOUTH DAILY AS NEEDED FOR FOR ERECTILE DYSFUNCTION 5 tablet 10  . simvastatin (ZOCOR) 20 MG tablet TAKE ONE TABLET BY MOUTH DAILY AT 6PM 90 tablet 3  . metFORMIN (GLUCOPHAGE-XR) 500 MG 24 hr tablet Take 1 tablet (500 mg total) by mouth daily with breakfast. (Patient not taking: Reported on 10/19/2020) 90 tablet 3   Current Facility-Administered Medications on File Prior to Visit  Medication Dose Route Frequency Provider Last Rate Last Admin  . heparin lock flush 100 unit/mL  500 Units Intravenous Once Betsy Coder B, MD      . octreotide (SANDOSTATIN LAR) 30 MG IM injection           . sodium chloride flush (NS) 0.9 % injection 10 mL  10 mL Intracatheter PRN Ladell Pier, MD            ROS:  All others reviewed and negative.  Objective        PE:  BP 124/76   Pulse 82   Temp 97.8 F (36.6 C) (Oral)   Ht 5\' 10"  (1.778 m)   Wt 168 lb (76.2 kg)   SpO2 98%   BMI 24.11 kg/m                 Constitutional: Pt appears in NAD               HENT: Head: NCAT.                Right Ear: External ear normal.                 Left Ear: External ear normal.                Eyes: . Pupils are equal, round, and reactive  to light. Conjunctivae and EOM are normal               Nose: without d/c or deformity               Neck: Neck supple. Gross normal ROM               Cardiovascular: Normal rate and regular rhythm.                 Pulmonary/Chest: Effort normal and breath sounds without rales or wheezing.                Abd:  Soft, NT, ND, + BS, no organomegaly               Neurological: Pt is alert. At baseline orientation, motor grossly intact               Skin: Skin is warm. No rashes, no other new lesions, LE edema - none               Psychiatric: Pt behavior is normal without agitation   Micro: none  Cardiac tracings I have personally interpreted today:  none  Pertinent Radiological findings (summarize): none   Lab Results  Component Value Date   WBC 4.3 10/11/2020   HGB 14.7 10/11/2020   HCT 42.6 10/11/2020   PLT 126 (L) 10/11/2020   GLUCOSE 150 (H) 10/11/2020   CHOL 136 10/19/2020   TRIG 60.0 10/19/2020   HDL 51.10 10/19/2020   LDLDIRECT 139.2 10/10/2008   LDLCALC 73 10/19/2020   ALT 25 10/11/2020   AST 31 10/11/2020   NA 138 10/11/2020  K 4.9 10/11/2020   CL 99 10/11/2020   CREATININE 1.38 (H) 10/11/2020   BUN 22 10/11/2020   CO2 31 10/11/2020   TSH 1.10 10/19/2020   PSA 1.88 10/19/2020   INR 1.30 04/16/2016   HGBA1C 6.1 10/19/2020   MICROALBUR 1.9 01/17/2020   Assessment/Plan:  Ronald Thompson is a 65 y.o. White or Caucasian [1] male with  has a past medical history of Baker's cyst, Gallstones, Heart murmur, Hypercholesteremia, Hypertension, Lesion of left lung, Liver metastasis ([place]), Occlusion of left subclavian vein ([place]) (1990), Pericarditis, Pneumonia (1990), Primary neuroendocrine tumor of pancreas (02/14/2014), Pulmonary fibrosis ([place]) (11/08/2015), S/P TAVR (transcatheter aortic valve replacement) (04/16/2016), Shortness of breath dyspnea, Testicular seminoma ([place]) (1990), and Transfusion history.  CKD (chronic kidney disease) stage 3, GFR 30-59 ml/min (HCC) Lab Results   Component Value Date   CREATININE 1.38 (H) 10/11/2020   Stable overall, cont to avoid nephrotoxins  HTN (hypertension) BP Readings from Last 3 Encounters:  10/19/20 124/76  10/17/20 106/73  09/20/20 116/71   Stable, pt to continue medical treatment hyzaar   Hyperglycemia Lab Results  Component Value Date   HGBA1C 6.1 10/19/2020   Stable, pt to continue current medical treatment metformin   Hyperlipidemia Lab Results  Component Value Date   LDLCALC 73 10/19/2020   Stable, pt to continue current statin zocor   Increased prostate specific antigen (PSA) velocity Asympt, for f/u psa  Followup: Return in about 1 year (around 10/19/2021).  Cathlean Cower, MD 10/22/2020 9:38 PM [facility]

## 2020-10-19 NOTE — Progress Notes (Deleted)
  Subjective:     Patient ID: Ronald Thompson, male   DOB: 09-29-1955, 65 y.o.   MRN: [id number]  HPI   Review of Systems     Objective:   Physical Exam     Assessment:     ***    Plan:     ***

## 2020-10-19 NOTE — Patient Instructions (Addendum)
You had the Pneumovax pneumonia shot today  Please continue all other medications as before, and refills have been done if requested.  Please have the pharmacy call with any other refills you may need.  Please continue your efforts at being more active, low cholesterol diet, and weight control.  You are otherwise up to date with prevention measures today.  Please keep your appointments with your specialists as you may have planned  Please go to the LAB at the blood drawing area for the tests to be done  You will be contacted by phone if any changes need to be made immediately.  Otherwise, you will receive a letter about your results with an explanation, but please check with MyChart first.  Please remember to sign up for MyChart if you have not done so, as this will be important to you in the future with finding out test results, communicating by private email, and scheduling acute appointments online when needed.  Please make an Appointment to return for your 1 year visit, or sooner if needed

## 2020-10-22 ENCOUNTER — Encounter: Payer: Self-pay | Admitting: Internal Medicine

## 2020-10-22 NOTE — Assessment & Plan Note (Signed)
Lab Results  Component Value Date   CREATININE 1.38 (H) 10/11/2020   Stable overall, cont to avoid nephrotoxins

## 2020-10-22 NOTE — Assessment & Plan Note (Signed)
BP Readings from Last 3 Encounters:  10/19/20 124/76  10/17/20 106/73  09/20/20 116/71   Stable, pt to continue medical treatment hyzaar

## 2020-10-22 NOTE — Assessment & Plan Note (Signed)
Lab Results  Component Value Date   HGBA1C 6.1 10/19/2020   Stable, pt to continue current medical treatment - metformin  

## 2020-10-22 NOTE — Assessment & Plan Note (Signed)
Asympt, for f/u psa 

## 2020-10-22 NOTE — Assessment & Plan Note (Signed)
Lab Results  Component Value Date   LDLCALC 73 10/19/2020   Stable, pt to continue current statin zocor

## 2020-10-31 ENCOUNTER — Telehealth: Payer: Self-pay

## 2020-10-31 NOTE — Telephone Encounter (Signed)
Spoke with patient to let him know that Dr. [name] had spoken with Dr. Leonia Reeves and he is recommended a standard PET scan to evaluate the enlarging liver lesion.  We also are scheduling an office visit the same day of his Sandostatin injection.  He is in agreement.  Patient also is concerned about his kidney function GFR is 56 and he was told he has Stage III kidney disease.  He is wondering what could have caused this and is there anything he can do to help with this?  I have sent a scheduling message regarding the follow up with Dr. [name].

## 2020-11-01 ENCOUNTER — Telehealth: Payer: Self-pay

## 2020-11-01 ENCOUNTER — Other Ambulatory Visit: Payer: Self-pay | Admitting: Oncology

## 2020-11-01 DIAGNOSIS — D3A8 Other benign neuroendocrine tumors: Secondary | ICD-10-CM

## 2020-11-01 NOTE — Telephone Encounter (Signed)
Spoke with patient regarding his concern over his kidney function.  Per Dr. [name] he thinks it is related to his hypertension and has suggested the his PCP refer him to nephrology.  The patient has verbalized an understanding.

## 2020-11-15 ENCOUNTER — Other Ambulatory Visit: Payer: Self-pay

## 2020-11-15 ENCOUNTER — Ambulatory Visit (HOSPITAL_COMMUNITY)
Admission: RE | Admit: 2020-11-15 | Discharge: 2020-11-15 | Disposition: A | Discharge status: Home / Self Care | Payer: Medicare Other | Source: Ambulatory Visit | Attending: Oncology | Admitting: Oncology

## 2020-11-15 DIAGNOSIS — D3A8 Other benign neuroendocrine tumors: Secondary | ICD-10-CM | POA: Insufficient documentation

## 2020-11-15 DIAGNOSIS — C787 Secondary malignant neoplasm of liver and intrahepatic bile duct: Secondary | ICD-10-CM | POA: Diagnosis not present

## 2020-11-15 DIAGNOSIS — R160 Hepatomegaly, not elsewhere classified: Secondary | ICD-10-CM | POA: Insufficient documentation

## 2020-11-15 LAB — GLUCOSE, CAPILLARY: Glucose-Capillary: 104 mg/dL — ABNORMAL HIGH (ref 70–99)

## 2020-11-15 MED ORDER — FLUDEOXYGLUCOSE F - 18 (FDG) INJECTION
7.0000 | Freq: Once | INTRAVENOUS | Status: AC | PRN
  Administered 2020-11-15: 8.3 via INTRAVENOUS

## 2020-11-16 ENCOUNTER — Inpatient Hospital Stay: Payer: Medicare Other | Attending: Oncology

## 2020-11-16 ENCOUNTER — Telehealth: Payer: Self-pay

## 2020-11-16 ENCOUNTER — Other Ambulatory Visit: Payer: Self-pay

## 2020-11-16 ENCOUNTER — Inpatient Hospital Stay: Payer: Medicare Other

## 2020-11-16 VITALS — BP 106/63 | HR 79 | Temp 98.7°F | Resp 16

## 2020-11-16 DIAGNOSIS — C7B8 Other secondary neuroendocrine tumors: Secondary | ICD-10-CM | POA: Diagnosis not present

## 2020-11-16 DIAGNOSIS — D3A8 Other benign neuroendocrine tumors: Secondary | ICD-10-CM

## 2020-11-16 DIAGNOSIS — C7A8 Other malignant neuroendocrine tumors: Secondary | ICD-10-CM | POA: Insufficient documentation

## 2020-11-16 MED ORDER — OCTREOTIDE ACETATE 30 MG IM KIT
30.0000 mg | PACK | Freq: Once | INTRAMUSCULAR | Status: AC
  Administered 2020-11-16: 30 mg via INTRAMUSCULAR
  Filled 2020-11-16: qty 1

## 2020-11-16 NOTE — Patient Instructions (Signed)
Octreotide injection solution What is this medicine? OCTREOTIDE (ok TREE oh tide) is used to reduce blood levels of growth hormone in patients with a condition called acromegaly. This medicine also reduces flushing and watery diarrhea caused by certain types of cancer. This medicine may be used for other purposes; ask your health care provider or pharmacist if you have questions. COMMON BRAND NAME(S): Bynfezia, Sandostatin What should I tell my health care provider before I take this medicine? They need to know if you have any of these conditions:  diabetes  gallbladder disease  kidney disease  liver disease  thyroid disease  an unusual or allergic reaction to octreotide, other medicines, foods, dyes, or preservatives  pregnant or trying to get pregnant  breast-feeding How should I use this medicine? This medicine is for injection under the skin or into a vein (only in emergency situations). It is usually given by a health care professional in a hospital or clinic setting. If you get this medicine at home, you will be taught how to prepare and give this medicine. Allow the injection solution to come to room temperature before use. Do not warm it artificially. Use exactly as directed. Take your medicine at regular intervals. Do not take your medicine more often than directed. It is important that you put your used needles and syringes in a special sharps container. Do not put them in a trash can. If you do not have a sharps container, call your pharmacist or healthcare provider to get one. Talk to your pediatrician regarding the use of this medicine in children. Special care may be needed. Overdosage: If you think you have taken too much of this medicine contact a poison control center or emergency room at once. NOTE: This medicine is only for you. Do not share this medicine with others. What if I miss a dose? If you miss a dose, take it as soon as you can. If it is almost time for your  next dose, take only that dose. Do not take double or extra doses. What may interact with this medicine?  bromocriptine  certain medicines for blood pressure, heart disease, irregular heartbeat  cyclosporine  diuretics  medicines for diabetes, including insulin  quinidine This list may not describe all possible interactions. Give your health care provider a list of all the medicines, herbs, non-prescription drugs, or dietary supplements you use. Also tell them if you smoke, drink alcohol, or use illegal drugs. Some items may interact with your medicine. What should I watch for while using this medicine? Visit your doctor or health care professional for regular checks on your progress. To help reduce irritation at the injection site, use a different site for each injection and make sure the solution is at room temperature before use. This medicine may cause decreases in blood sugar. Signs of low blood sugar include chills, cool, pale skin or cold sweats, drowsiness, extreme hunger, fast heartbeat, headache, nausea, nervousness or anxiety, shakiness, trembling, unsteadiness, tiredness, or weakness. Contact your doctor or health care professional right away if you experience any of these symptoms. This medicine may increase blood sugar. Ask your healthcare provider if changes in diet or medicines are needed if you have diabetes. This medicine may cause a decrease in vitamin B12. You should make sure that you get enough vitamin B12 while you are taking this medicine. Discuss the foods you eat and the vitamins you take with your health care professional. What side effects may I notice from receiving this medicine? Side   effects that you should report to your doctor or health care professional as soon as possible:  allergic reactions like skin rash, itching or hives, swelling of the face, lips, or tongue  fast, slow, or irregular heartbeat  right upper belly pain  severe stomach pain  signs  and symptoms of high blood sugar such as being more thirsty or hungry or having to urinate more than normal. You may also feel very tired or have blurry vision.  signs and symptoms of low blood sugar such as feeling anxious; confusion; dizziness; increased hunger; unusually weak or tired; increased sweating; shakiness; cold, clammy skin; irritable; headache; blurred vision; fast heartbeat; loss of consciousness  unusually weak or tired Side effects that usually do not require medical attention (report to your doctor or health care professional if they continue or are bothersome):  diarrhea  dizziness  gas  headache  nausea, vomiting  pain, redness, or irritation at site where injected  upset stomach This list may not describe all possible side effects. Call your doctor for medical advice about side effects. You may report side effects to FDA at 1-800-FDA-1088. Where should I keep my medicine? Keep out of the reach of children. Store in a refrigerator between 2 and 8 degrees C (36 and 46 degrees F). Protect from light. Allow to come to room temperature naturally. Do not use artificial heat. If protected from light, the injection may be stored at room temperature between 20 and 30 degrees C (70 and 86 degrees F) for 14 days. After the initial use, throw away any unused portion of a multiple dose vial after 14 days. Throw away unused portions of the ampules after use. NOTE: This sheet is a summary. It may not cover all possible information. If you have questions about this medicine, talk to your doctor, pharmacist, or health care provider.  2021 Elsevier/Gold Standard (2019-02-18 13:33:09)  

## 2020-11-16 NOTE — Telephone Encounter (Signed)
Pt called with results per Dr [name]. Dr [name] spoke with Pt after results given. Pt to be referred back to Dr. Leamon Arnt for F/U visit.Pt verbalized understanding.

## 2020-11-16 NOTE — Telephone Encounter (Signed)
-----   Message from Ladell Pier, MD sent at 11/15/2020  6:00 PM EDT ----- Please call patient, the PET shows a few lesions that have enlarged since last year that have PET activity but are not DOTATATE  avid, these lesions are likely changing character over time(less differentiated). I recommend continuing Sandostatin, f/u as scheduled.  I think we should refer him back to Dr. Leamon Arnt to consider other treatment options, I can schedule a phone visit or in person visit to discuss prior to next injection if he wants

## 2020-11-17 ENCOUNTER — Encounter: Payer: Self-pay | Admitting: *Deleted

## 2020-11-17 NOTE — Progress Notes (Signed)
Notified Tedra Coupe in radiology to push over his 11/15/20 PET and 10/13/20 NETSPOT images to Dr. Jacklynn Ganong at [facility].

## 2020-11-18 ENCOUNTER — Telehealth (INDEPENDENT_AMBULATORY_CARE_PROVIDER_SITE_OTHER): Payer: Medicare Other | Admitting: Family Medicine

## 2020-11-18 ENCOUNTER — Encounter: Payer: Self-pay | Admitting: Family Medicine

## 2020-11-18 DIAGNOSIS — R059 Cough, unspecified: Secondary | ICD-10-CM

## 2020-11-18 DIAGNOSIS — R52 Pain, unspecified: Secondary | ICD-10-CM

## 2020-11-18 DIAGNOSIS — Z7189 Other specified counseling: Secondary | ICD-10-CM | POA: Diagnosis not present

## 2020-11-18 NOTE — Progress Notes (Signed)
Virtual Visit via Telephone Note  I connected with Ronald Thompson on 11/18/20 at  9:30 AM EDT by telephone and verified that I am speaking with the correct person using two identifiers.   I discussed the limitations, risks, security and privacy concerns of performing an evaluation and management service by telephone and the availability of in person appointments. I also discussed with the patient that there may be a patient responsible charge related to this service. The patient expressed understanding and agreed to proceed.  Location patient: home Location provider: work or home office Participants present for the call: patient, provider Patient did not have a visit in the prior 7 days to address this/these issue(s).   History of Present Illness: Patient is a 45-yo seen for acute concern.  Patient states gets bronchitis yearly.  Has a non productive cough that started Tuesday night.  Endorses body aches and difficulty sleeping.  Ear pressure, HAs, and throat irritation occur with coughing.  Patient denies fever, nausea, vomiting, ear pain, rhinorrhea.  Pt took tylenol.  Has not tried any other meds as states they never work.  Requesting a Z-Pak.  Has an appointment on Monday with PCP.   Observations/Objective: Patient sounds cheerful and well on the phone. I do not appreciate any SOB. Speech and thought processing are grossly intact. Patient reported vitals:  Assessment and Plan: Cough  Body aches  Educated about COVID-19 virus infection  -Patient advised of limitations in assessing symptoms via telephone -Pt advised sx likely viral.  Must also consider COVID-19 virus infection. -Discussed supportive care including Robitussin, warm fluids, other OTC cough/cold medications. -Patient advised to have Covid testing. -Given precautions for continued or worsening symptoms   Follow Up Instructions: F/u with pcp on Monday.  I did not refer this patient for an OV in the next 24 hours for  this/these issue(s).  I discussed the assessment and treatment plan with the patient. The patient was provided an opportunity to ask questions and all were answered. The patient agreed with the plan and demonstrated an understanding of the instructions.   The patient was advised to call back or seek an in-person evaluation if the symptoms worsen or if the condition fails to improve as anticipated.  I provided 5 minutes of non-face-to-face time during this encounter.   Billie Ruddy, MD

## 2020-11-19 ENCOUNTER — Encounter (HOSPITAL_BASED_OUTPATIENT_CLINIC_OR_DEPARTMENT_OTHER): Payer: Self-pay | Admitting: Emergency Medicine

## 2020-11-19 ENCOUNTER — Other Ambulatory Visit: Payer: Self-pay

## 2020-11-19 ENCOUNTER — Emergency Department (HOSPITAL_BASED_OUTPATIENT_CLINIC_OR_DEPARTMENT_OTHER): Payer: Medicare Other | Admitting: Radiology

## 2020-11-19 ENCOUNTER — Emergency Department (HOSPITAL_BASED_OUTPATIENT_CLINIC_OR_DEPARTMENT_OTHER)
Admission: EM | Admit: 2020-11-19 | Discharge: 2020-11-19 | Disposition: A | Discharge status: Home / Self Care | Payer: Medicare Other | Attending: Emergency Medicine | Admitting: Emergency Medicine

## 2020-11-19 DIAGNOSIS — Z8547 Personal history of malignant neoplasm of testis: Secondary | ICD-10-CM | POA: Insufficient documentation

## 2020-11-19 DIAGNOSIS — N183 Chronic kidney disease, stage 3 unspecified: Secondary | ICD-10-CM | POA: Diagnosis not present

## 2020-11-19 DIAGNOSIS — E1122 Type 2 diabetes mellitus with diabetic chronic kidney disease: Secondary | ICD-10-CM | POA: Insufficient documentation

## 2020-11-19 DIAGNOSIS — Z20822 Contact with and (suspected) exposure to covid-19: Secondary | ICD-10-CM | POA: Insufficient documentation

## 2020-11-19 DIAGNOSIS — Z8589 Personal history of malignant neoplasm of other organs and systems: Secondary | ICD-10-CM | POA: Diagnosis not present

## 2020-11-19 DIAGNOSIS — I129 Hypertensive chronic kidney disease with stage 1 through stage 4 chronic kidney disease, or unspecified chronic kidney disease: Secondary | ICD-10-CM | POA: Insufficient documentation

## 2020-11-19 DIAGNOSIS — J209 Acute bronchitis, unspecified: Secondary | ICD-10-CM | POA: Insufficient documentation

## 2020-11-19 DIAGNOSIS — Z7984 Long term (current) use of oral hypoglycemic drugs: Secondary | ICD-10-CM | POA: Diagnosis not present

## 2020-11-19 DIAGNOSIS — Z79899 Other long term (current) drug therapy: Secondary | ICD-10-CM | POA: Diagnosis not present

## 2020-11-19 DIAGNOSIS — R059 Cough, unspecified: Secondary | ICD-10-CM | POA: Diagnosis not present

## 2020-11-19 DIAGNOSIS — R0602 Shortness of breath: Secondary | ICD-10-CM | POA: Diagnosis not present

## 2020-11-19 LAB — SARS CORONAVIRUS 2 (TAT 6-24 HRS): SARS Coronavirus 2: NEGATIVE

## 2020-11-19 MED ORDER — DOXYCYCLINE HYCLATE 100 MG PO CAPS: 100.0000 mg | ORAL_CAPSULE | Freq: Two times a day (BID) | ORAL | 0 refills | Status: DC

## 2020-11-19 NOTE — ED Provider Notes (Signed)
[facility] EMERGENCY DEPT Provider Note   CSN: [id number] Arrival date & time: 11/19/20  0957     History Chief Complaint  Patient presents with  . Shortness of Breath    Aloysuis Ribaudo is a 65 y.o. male.  Miley Lindon had metastatic testicular cancer in the 1990s.  As a result, he sustained some damage to his lungs from the radiation treatments.  He states that he has had complicated bronchitis ever since that time, and he started to get sick about 5 days ago.  He has been using over-the-counter medication without much relief.  He is vaccinated and boosted for COVID-19.  The history is provided by the patient.  Cough Cough characteristics:  Productive Sputum characteristics:  Nondescript Severity:  Severe Onset quality:  Gradual Duration:  5 days Timing:  Intermittent Progression:  Worsening Chronicity:  New Smoker: no   Context: upper respiratory infection   Context: not sick contacts   Relieved by:  Nothing Worsened by:  Nothing Ineffective treatments: mucinex. Associated symptoms: shortness of breath   Associated symptoms: no chest pain, no chills, no ear pain, no fever, no rash and no sore throat        Past Medical History:  Diagnosis Date  . Baker's cyst   . Gallstones   . Heart murmur   . Hypercholesteremia   . Hypertension   . Lesion of left lung    hx.25 yrs ago- testicilar cancer related- only scarring left after tx.-no problems now  . Liver metastasis ([place])   . 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  . Pericarditis    2nd to tumor  . Pneumonia 1990  . Primary neuroendocrine tumor of pancreas 02/14/2014   Stage IV with multiple mets to liver  . Pulmonary fibrosis ([place]) 11/08/2015  . S/P TAVR (transcatheter aortic valve replacement) 04/16/2016   26 mm Edwards Sapien 3 transcatheter heart valve placed via percutaneous right transfemoral approach   .  Shortness of breath dyspnea    with exertion and when tired  . 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 Transfusion history    hx. 25 yrs ago-during cancer tx.    Patient Active Problem List   Diagnosis Date Noted  . CKD (chronic kidney disease) stage 3, GFR 30-59 ml/min (HCC) 10/19/2020  . Type 2 diabetes mellitus without complication, without long-term current use of insulin ([place]) 01/17/2020  . Type 2 diabetes mellitus with hyperglycemia, without long-term current use of insulin ([place]) 10/11/2019  . OAB (overactive bladder) 09/22/2019  . Low back pain 03/29/2019  . Cough 09/21/2018  . Wheezing 09/21/2018  . HTN (hypertension) 02/11/2018  . Increased prostate specific antigen (PSA) velocity 02/11/2018  . Carpal tunnel syndrome 10/04/2017  . Right cervical radiculopathy 10/02/2017  . Hyperglycemia 01/08/2017  . Constipation 07/23/2016  . Shoulder pain, left 07/23/2016  . S/P TAVR (transcatheter aortic valve replacement) 04/16/2016  . Dyspnea 11/08/2015  . Pulmonary fibrosis ([place]) 11/08/2015  . Nocturia 11/08/2015  . Snoring 11/08/2015  . Deviated nasal septum 11/08/2015  . Bilateral hearing loss 11/08/2015  . Erectile dysfunction 02/07/2015  . Fatigue 02/07/2015  . Olecranon bursitis 09/27/2014  . Essential tremor 09/27/2014  . Primary neuroendocrine tumor of pancreas 02/14/2014  . Abdominal pain, unspecified site 02/04/2014  . Dehydration 02/04/2014  . Aortic stenosis 02/03/2014  . History of shingles 02/03/2014  .  Left lumbar radiculopathy 11/30/2012  . Acute bronchitis 12/19/2011  . Preventative health care 12/15/2011  . HEART MURMUR, SYSTOLIC 27/25/3664  . PREHYPERTENSION 10/14/2008  . Hyperlipidemia 07/16/2007  . BAKER'S CYST 06/06/2007    Past Surgical History:  Procedure Laterality Date  . BACK SURGERY     '14- rupt. disc   . CARDIAC CATHETERIZATION N/A 01/24/2016   Procedure:  Right/Left Heart Cath and Coronary Angiography;  Surgeon: [name], MD;  Location: [facility];  Service: Cardiovascular;  Laterality: N/A;  . COLONOSCOPY    . EUS N/A 02/10/2014   Procedure: UPPER ENDOSCOPIC ULTRASOUND (EUS) LINEAR;  Surgeon: [name], MD;  [facility];  Service: Endoscopy;  Laterality: N/A;  . IR RADIOLOGIST EVAL & MGMT  02/11/2017  . KNEE SURGERY Right    age 20 for Baker's cyst  . NASAL SEPTUM SURGERY     Deviated septrum  . TEE WITHOUT CARDIOVERSION N/A 04/16/2016   Procedure: TRANSESOPHAGEAL ECHOCARDIOGRAM (TEE);  Surgeon: [name], MD;  Location: [facility];  Service: Open Heart Surgery;  Laterality: N/A;  . THORACOTOMY  1990   wedge biopsy of mediastinal mass  . TRANSCATHETER AORTIC VALVE REPLACEMENT, TRANSFEMORAL N/A 04/16/2016   Procedure: TRANSCATHETER AORTIC VALVE REPLACEMENT, TRANSFEMORAL;  Surgeon: [name], MD;  Location: [facility];  Service: Open Heart Surgery;  Laterality: N/A;       Family History  Problem Relation Age of Onset  . Dementia Mother   . Cancer Paternal Grandfather        esophagus  . Cancer Maternal Aunt        colon  . Emphysema Maternal Aunt     Social History   Tobacco Use  . Smoking status: Never Smoker  . Smokeless tobacco: Never Used  Vaping Use  . Vaping Use: Never used  Substance Use Topics  . Alcohol use: Yes    Comment: rare- social  . Drug use: No    Home Medications Prior to Admission medications   Medication Sig Start Date End Date Taking? Authorizing Provider  acetaminophen (TYLENOL) 500 MG tablet Take 1,000 mg by mouth every 6 (six) hours as needed for headache.    [provider]  albuterol (PROVENTIL HFA;VENTOLIN HFA) 108 (90 Base) MCG/ACT inhaler Inhale 1-2 puffs into the lungs every 6 (six) hours as needed for wheezing or shortness of breath. 03/24/17   Nche, Charlene Brooke, NP  Blood Pressure Monitoring (BLOOD PRESSURE CUFF) MISC Use as directed 05/25/18   Marrian Salvage, FNP  calcium carbonate (TUMS - DOSED IN MG ELEMENTAL CALCIUM) 500 MG chewable tablet Chew 3 tablets by mouth 3 (three) times daily as needed for indigestion or heartburn.     [provider]  clindamycin (CLEOCIN) 300 MG capsule Take 2 capsules by mouth one hour prior to dental appointment 05/13/16   [name], MD  cyclobenzaprine (FLEXERIL) 5 MG tablet Take 1 tablet (5 mg total) by mouth 3 (three) times daily as needed for muscle spasms. 03/29/19   [name], MD  docusate sodium (COLACE) 100 MG capsule Take 100 mg by mouth daily as needed for mild constipation.    [provider]  gabapentin (NEURONTIN) 300 MG capsule TAKE ONE CAPSULE BY MOUTH THREE TIMES A DAY   START AFRER 100 MG INTIAL RX 05/22/20   [name], MD  glucose blood [facility] VERIO) test strip Use as instructed to test blood sugar one time daily E11.65 10/11/19   Shamleffer, Melanie Crazier, MD  ibuprofen (ADVIL,MOTRIN) 200 MG tablet Take 400 mg by mouth every 6 (six) hours as needed for headache or mild pain.     [provider]  Lancet Device MISC Use as instructed to test blood sugar daily E11.65 10/13/19   Shamleffer, Melanie Crazier, MD  losartan-hydrochlorothiazide [facility]) 100-25 MG tablet TAKE ONE TABLET BY MOUTH DAILY 08/23/20   [name], MD  metFORMIN (GLUCOPHAGE-XR) 500 MG 24 hr tablet Take 1 tablet (500 mg total) by mouth daily with breakfast. 01/17/20   Shamleffer, Melanie Crazier, MD  Octreotide Acetate (SANDOSTATIN IJ) Inject 1 each as directed every 30 (thirty) days.     [provider]  ondansetron (ZOFRAN) 8 MG tablet Take 1 tablet (8 mg total) by mouth 2 (two) times daily as needed for nausea or vomiting. 12/22/19   Kerby Moors, MD  OneTouch Delica Lancets 92J MISC Use as instructed to test blood sugar once daily E11.65 10/11/19   Shamleffer, Melanie Crazier, MD  sildenafil (VIAGRA) 100 MG tablet TAKE 1/2 TO 1 TABLET BY MOUTH DAILY AS NEEDED FOR FOR  ERECTILE DYSFUNCTION 04/26/20   [name], MD  simvastatin (ZOCOR) 20 MG tablet TAKE ONE TABLET BY MOUTH DAILY AT 6PM 04/26/20   [name], MD    Allergies    Penicillins  Review of Systems   Review of Systems  Constitutional: Negative for chills and fever.  HENT: Negative for ear pain and sore throat.   Eyes: Negative for pain and visual disturbance.  Respiratory: Positive for cough and shortness of breath.   Cardiovascular: Negative for chest pain and palpitations.  Gastrointestinal: Negative for abdominal pain and vomiting.  Genitourinary: Negative for dysuria and hematuria.  Musculoskeletal: Negative for arthralgias and back pain.  Skin: Negative for color change and rash.  Neurological: Negative for seizures and syncope.  All other systems reviewed and are negative.   Physical Exam Updated Vital Signs BP 122/74 (BP Location: Left Arm)   Pulse 83   Temp 97.7 F (36.5 C) (Oral)   Resp (!) 22   Ht 5\' 10"  (1.778 m)   Wt 74.8 kg   SpO2 100%   BMI 23.68 kg/m   Physical Exam Vitals and nursing note reviewed.  Constitutional:      Appearance: He is well-developed.  HENT:     Head: Normocephalic and atraumatic.  Eyes:     Conjunctiva/sclera: Conjunctivae normal.  Cardiovascular:     Rate and Rhythm: Normal rate and regular rhythm.     Heart sounds: No murmur heard.   Pulmonary:     Effort: Pulmonary effort is normal. Tachypnea present. No respiratory distress.     Breath sounds: Examination of the left-lower field reveals decreased breath sounds and rhonchi. Decreased breath sounds and rhonchi present.  Musculoskeletal:     Cervical back: Neck supple.  Skin:    General: Skin is warm and dry.  Neurological:     General: No focal deficit present.     Mental Status: He is alert.  Psychiatric:        Mood and Affect: Mood normal.     ED Results / Procedures / Treatments   Labs (all labs ordered are listed, but only abnormal results are displayed) Labs  Reviewed  SARS CORONAVIRUS 2 (TAT 6-24 HRS)    EKG None  Radiology DG Chest 2 View  Result Date: 11/19/2020 CLINICAL DATA:  Cough.  History of radiation treatment to lungs. EXAM: CHEST - 2 VIEW COMPARISON:  Chest x-ray dated  03/24/2017. FINDINGS: Heart size and mediastinal contours are stable. Chronic bronchitic changes noted centrally. Lungs appear otherwise clear. No pleural effusion or pneumothorax is seen. Chronic elevation of the LEFT hemidiaphragm is stable. No acute or suspicious osseous finding. IMPRESSION: 1. No active cardiopulmonary disease. No evidence of pneumonia or pulmonary edema. 2. Chronic bronchitic changes. Electronically Signed   By: Franki Cabot M.D.   On: 11/19/2020 10:51    Procedures Procedures   Medications Ordered in ED Medications - No data to display  ED Course  I have reviewed the triage vital signs and the nursing notes.  Pertinent labs & imaging results that were available during my care of the patient were reviewed by me and considered in my medical decision making (see chart for details).    MDM Rules/Calculators/A&P                          Corliss Lamartina is well-appearing.  He has a history of neuroendocrine carcinoma that is being treated.  He has a history of radiation damage to his lungs and presents with an acute illness consistent with possible bronchitis.  He is not hypoxic.  Symptoms seem consistent with infectious etiology.  I do not suspect acute coronary syndrome, PE, CHF.  He typically requires antibiotics secondary to his underlying lung damage.  He was requesting a azithromycin, but we spoke, and I told him that doxycycline is likely a more effective choice.  He does have PCP follow-up tomorrow.  Finally, he has a COVID-19 test pending. Final Clinical Impression(s) / ED Diagnoses Final diagnoses:  Complicated acute bronchitis    Rx / DC Orders ED Discharge Orders         Ordered    doxycycline (VIBRAMYCIN) 100 MG capsule  2 times  daily        11/19/20 1059           Arnaldo Natal, MD 11/19/20 1101

## 2020-11-19 NOTE — ED Triage Notes (Signed)
Pt via pov from home with sob/chest pain when coughing. Pt states he periodically gets bronchitis, and he has been feeling this since Tuesday but could not get in to see his PCP until Monday. Pt alert & oriented, NAD noted.

## 2020-11-19 NOTE — ED Notes (Signed)
Pt discharged home after verbalizing understanding of discharge instructions; nad noted. 

## 2020-11-20 ENCOUNTER — Encounter: Payer: Self-pay | Admitting: Internal Medicine

## 2020-11-20 ENCOUNTER — Ambulatory Visit (INDEPENDENT_AMBULATORY_CARE_PROVIDER_SITE_OTHER): Payer: Medicare Other | Admitting: Internal Medicine

## 2020-11-20 ENCOUNTER — Telehealth: Payer: Self-pay | Admitting: Internal Medicine

## 2020-11-20 VITALS — BP 116/70 | HR 84 | Temp 97.5°F | Ht 70.0 in | Wt 169.0 lb

## 2020-11-20 DIAGNOSIS — R739 Hyperglycemia, unspecified: Secondary | ICD-10-CM

## 2020-11-20 DIAGNOSIS — R059 Cough, unspecified: Secondary | ICD-10-CM | POA: Diagnosis not present

## 2020-11-20 NOTE — Telephone Encounter (Signed)
[facility] FYI   His s&s started Tuesday and are worse this am. He is getting worse and wants a z pack. He has a bad cough. no fever yellow phlegm, no difficulty of breathing.   Advised to be seen by PCP with 4 hours. Patient understood and decided to go to Urgent care

## 2020-11-20 NOTE — Progress Notes (Signed)
Patient ID: Ronald Thompson, male   DOB: 03-Jun-1956, 65 y.o.   MRN: [id number]        Chief Complaint: follow up recent bronchitis       HPI:  Ronald Thompson is a 65 y.o. male here with recent bronchitis tx per UC, asked to f/u here, now doing well, without fever, chills, and Pt denies chest pain, increased sob or doe, wheezing, orthopnea, PND, increased LE swelling, palpitations, dizziness or syncope.  Has only mild residual non prod cough.   Pt denies polydipsia, polyuria,  Denies new focal neuro s/s.         Wt Readings from Last 3 Encounters:  11/20/20 169 lb (76.7 kg)  11/19/20 165 lb (74.8 kg)  10/19/20 168 lb (76.2 kg)   BP Readings from Last 3 Encounters:  11/20/20 116/70  11/19/20 126/78  11/16/20 106/63         Past Medical History:  Diagnosis Date  . Baker's cyst   . Gallstones   . Heart murmur   . Hypercholesteremia   . Hypertension   . Lesion of left lung    hx.25 yrs ago- testicilar cancer related- only scarring left after tx.-no problems now  . Liver metastasis ([place])   . 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  . Pericarditis    2nd to tumor  . Pneumonia 1990  . Primary neuroendocrine tumor of pancreas 02/14/2014   Stage IV with multiple mets to liver  . Pulmonary fibrosis ([place]) 11/08/2015  . S/P TAVR (transcatheter aortic valve replacement) 04/16/2016   26 mm Edwards Sapien 3 transcatheter heart valve placed via percutaneous right transfemoral approach   . Shortness of breath dyspnea    with exertion and when tired  . 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 Transfusion history    hx. 25 yrs ago-during cancer tx.   Past Surgical History:  Procedure Laterality Date  . BACK SURGERY     '14- rupt. disc   . CARDIAC CATHETERIZATION N/A 01/24/2016   Procedure: Right/Left Heart Cath and Coronary Angiography;   Surgeon: [name], MD;  Location: [facility];  Service: Cardiovascular;  Laterality: N/A;  . COLONOSCOPY    . EUS N/A 02/10/2014   Procedure: UPPER ENDOSCOPIC ULTRASOUND (EUS) LINEAR;  Surgeon: [name], MD;  [facility];  Service: Endoscopy;  Laterality: N/A;  . IR RADIOLOGIST EVAL & MGMT  02/11/2017  . KNEE SURGERY Right    age 86 for Baker's cyst  . NASAL SEPTUM SURGERY     Deviated septrum  . TEE WITHOUT CARDIOVERSION N/A 04/16/2016   Procedure: TRANSESOPHAGEAL ECHOCARDIOGRAM (TEE);  Surgeon: [name], MD;  Location: [facility];  Service: Open Heart Surgery;  Laterality: N/A;  . THORACOTOMY  1990   wedge biopsy of mediastinal mass  . TRANSCATHETER AORTIC VALVE REPLACEMENT, TRANSFEMORAL N/A 04/16/2016   Procedure: TRANSCATHETER AORTIC VALVE REPLACEMENT, TRANSFEMORAL;  Surgeon: [name], MD;  Location: [facility];  Service: Open Heart Surgery;  Laterality: N/A;    reports that he has never smoked. He has never used smokeless tobacco. He reports current alcohol use. He reports that he does not use drugs. family history includes Cancer in his maternal aunt and paternal grandfather; Dementia in his mother; Emphysema in his maternal aunt. Allergies  Allergen Reactions  . Penicillins Hives    ENTIRE  BODY Has patient had a PCN reaction causing immediate rash, facial/tongue/throat swelling, SOB or lightheadedness with hypotension: No Has patient had a PCN reaction causing severe rash involving mucus membranes or skin necrosis: No Has patient had a PCN reaction that required hospitalization * *  YES  * * Has patient had a PCN reaction occurring within the last 10 years: No If all of the above answers are "NO", then may proceed with Cephalosporin use. *reaction occurred when he was 17   Current Outpatient Medications on File Prior to Visit  Medication Sig Dispense Refill  . acetaminophen (TYLENOL) 500 MG tablet Take 1,000 mg by mouth every 6 (six) hours as needed  for headache.    . albuterol (PROVENTIL HFA;VENTOLIN HFA) 108 (90 Base) MCG/ACT inhaler Inhale 1-2 puffs into the lungs every 6 (six) hours as needed for wheezing or shortness of breath. 1 Inhaler 0  . Blood Pressure Monitoring (BLOOD PRESSURE CUFF) MISC Use as directed 1 each 0  . calcium carbonate (TUMS - DOSED IN MG ELEMENTAL CALCIUM) 500 MG chewable tablet Chew 3 tablets by mouth 3 (three) times daily as needed for indigestion or heartburn.     . clindamycin (CLEOCIN) 300 MG capsule Take 2 capsules by mouth one hour prior to dental appointment 6 capsule 2  . cyclobenzaprine (FLEXERIL) 5 MG tablet Take 1 tablet (5 mg total) by mouth 3 (three) times daily as needed for muscle spasms. 40 tablet 0  . docusate sodium (COLACE) 100 MG capsule Take 100 mg by mouth daily as needed for mild constipation.    Marland Kitchen doxycycline (VIBRAMYCIN) 100 MG capsule Take 1 capsule (100 mg total) by mouth 2 (two) times daily. 20 capsule 0  . gabapentin (NEURONTIN) 300 MG capsule TAKE ONE CAPSULE BY MOUTH THREE TIMES A DAY   START AFRER 100 MG INTIAL RX 90 capsule 1  . glucose blood (ONETOUCH VERIO) test strip Use as instructed to test blood sugar one time daily E11.65 100 each 12  . ibuprofen (ADVIL,MOTRIN) 200 MG tablet Take 400 mg by mouth every 6 (six) hours as needed for headache or mild pain.     Elmore Guise Device MISC Use as instructed to test blood sugar daily E11.65 1 each 2  . losartan-hydrochlorothiazide (HYZAAR) 100-25 MG tablet TAKE ONE TABLET BY MOUTH DAILY 90 tablet 2  . metFORMIN (GLUCOPHAGE-XR) 500 MG 24 hr tablet Take 1 tablet (500 mg total) by mouth daily with breakfast. 90 tablet 3  . Octreotide Acetate (SANDOSTATIN IJ) Inject 1 each as directed every 30 (thirty) days.     . ondansetron (ZOFRAN) 8 MG tablet Take 1 tablet (8 mg total) by mouth 2 (two) times daily as needed for nausea or vomiting. 20 tablet 0  . OneTouch Delica Lancets 67H MISC Use as instructed to test blood sugar once daily E11.65 150 each 6   . sildenafil (VIAGRA) 100 MG tablet TAKE 1/2 TO 1 TABLET BY MOUTH DAILY AS NEEDED FOR FOR ERECTILE DYSFUNCTION 5 tablet 10  . simvastatin (ZOCOR) 20 MG tablet TAKE ONE TABLET BY MOUTH DAILY AT 6PM 90 tablet 3   Current Facility-Administered Medications on File Prior to Visit  Medication Dose Route Frequency Provider Last Rate Last Admin  . heparin lock flush 100 unit/mL  500 Units Intravenous Once Betsy Coder B, MD      . octreotide (SANDOSTATIN LAR) 30 MG IM injection           . sodium chloride flush (NS) 0.9 % injection 10  mL  10 mL Intracatheter PRN Ladell Pier, MD            ROS:  All others reviewed and negative.  Objective        PE:  BP 116/70 (BP Location: Left Arm, Patient Position: Sitting, Cuff Size: Large)   Pulse 84   Temp (!) 97.5 F (36.4 C) (Oral)   Ht 5\' 10"  (1.778 m)   Wt 169 lb (76.7 kg)   SpO2 99%   BMI 24.25 kg/m                 Constitutional: Pt appears in NAD               HENT: Head: NCAT.                Right Ear: External ear normal.                 Left Ear: External ear normal.                Eyes: . Pupils are equal, round, and reactive to light. Conjunctivae and EOM are normal               Nose: without d/c or deformity               Neck: Neck supple. Gross normal ROM               Cardiovascular: Normal rate and regular rhythm.                 Pulmonary/Chest: Effort normal and breath sounds without rales or wheezing.                Abd:  Soft, NT, ND, + BS, no organomegaly               Neurological: Pt is alert. At baseline orientation, motor grossly intact               Skin: Skin is warm. No rashes, no other new lesions, LE edema - none               Psychiatric: Pt behavior is normal without agitation   Micro: none  Cardiac tracings I have personally interpreted today:  none  Pertinent Radiological findings (summarize): none   Lab Results  Component Value Date   WBC 4.3 10/11/2020   HGB 14.7 10/11/2020   HCT 42.6  10/11/2020   PLT 126 (L) 10/11/2020   GLUCOSE 150 (H) 10/11/2020   CHOL 136 10/19/2020   TRIG 60.0 10/19/2020   HDL 51.10 10/19/2020   LDLDIRECT 139.2 10/10/2008   LDLCALC 73 10/19/2020   ALT 25 10/11/2020   AST 31 10/11/2020   NA 138 10/11/2020   K 4.9 10/11/2020   CL 99 10/11/2020   CREATININE 1.38 (H) 10/11/2020   BUN 22 10/11/2020   CO2 31 10/11/2020   TSH 1.10 10/19/2020   PSA 1.88 10/19/2020   INR 1.30 04/16/2016   HGBA1C 6.1 10/19/2020   MICROALBUR 1.9 01/17/2020   Assessment/Plan:  Nuri Larmer is a 65 y.o. White or Caucasian [1] male with  has a past medical history of Baker's cyst, Gallstones, Heart murmur, Hypercholesteremia, Hypertension, Lesion of left lung, Liver metastasis ([place]), Occlusion of left subclavian vein ([place]) (1990), Pericarditis, Pneumonia (1990), Primary neuroendocrine tumor of pancreas (02/14/2014), Pulmonary fibrosis ([place]) (11/08/2015), S/P TAVR (transcatheter aortic valve replacement) (04/16/2016), Shortness of breath dyspnea, Testicular seminoma ([place]) (1990), and Transfusion history.  Cough With recent bronchitis now improved,  to f/u any worsening symptoms or concerns  Hyperglycemia Lab Results  Component Value Date   HGBA1C 6.1 10/19/2020   Stable, pt to continue current medical treatment metformin   Followup: Return if symptoms worsen or fail to improve.  Cathlean Cower, MD 11/26/2020 9:32 PM [facility]

## 2020-11-20 NOTE — Patient Instructions (Signed)
Your Mychart ID and PW was updated today  Please continue all other medications as before, and refills have been done if requested.  Please have the pharmacy call with any other refills you may need.  Please continue your efforts at being more active, low cholesterol diet, and weight control.  Please keep your appointments with your specialists as you may have planned

## 2020-11-24 LAB — CHROMOGRANIN A: Chromogranin A (ng/mL): 4260 ng/mL — ABNORMAL HIGH (ref 0.0–101.8)

## 2020-11-26 ENCOUNTER — Encounter: Payer: Self-pay | Admitting: Internal Medicine

## 2020-11-26 NOTE — Assessment & Plan Note (Signed)
Lab Results  Component Value Date   HGBA1C 6.1 10/19/2020   Stable, pt to continue current medical treatment - metformin  

## 2020-11-26 NOTE — Assessment & Plan Note (Signed)
With recent bronchitis now improved,  to f/u any worsening symptoms or concerns

## 2020-12-19 ENCOUNTER — Other Ambulatory Visit: Payer: Self-pay

## 2020-12-19 ENCOUNTER — Inpatient Hospital Stay: Payer: Medicare Other | Attending: Oncology

## 2020-12-19 ENCOUNTER — Telehealth: Payer: Self-pay

## 2020-12-19 ENCOUNTER — Inpatient Hospital Stay (HOSPITAL_BASED_OUTPATIENT_CLINIC_OR_DEPARTMENT_OTHER): Payer: Medicare Other | Admitting: Oncology

## 2020-12-19 VITALS — BP 126/67 | HR 91 | Temp 98.2°F | Resp 18 | Ht 70.0 in | Wt 169.0 lb

## 2020-12-19 DIAGNOSIS — C7B8 Other secondary neuroendocrine tumors: Secondary | ICD-10-CM | POA: Insufficient documentation

## 2020-12-19 DIAGNOSIS — D3A8 Other benign neuroendocrine tumors: Secondary | ICD-10-CM

## 2020-12-19 DIAGNOSIS — C7A8 Other malignant neuroendocrine tumors: Secondary | ICD-10-CM | POA: Diagnosis not present

## 2020-12-19 MED ORDER — OCTREOTIDE ACETATE 30 MG IM KIT
30.0000 mg | PACK | Freq: Once | INTRAMUSCULAR | Status: AC
Start: 2020-12-19 — End: 2020-12-19
  Administered 2020-12-19: 30 mg via INTRAMUSCULAR

## 2020-12-19 NOTE — Telephone Encounter (Signed)
TC to Dr. Leamon Arnt office at [facility] to refer Pt for an appointment spoke with intake department Pt has not seen Dr Leamon Arnt since 2017 will have to send New Pt referral. Referral faxed to [phone].

## 2020-12-19 NOTE — Progress Notes (Signed)
[place] OFFICE PROGRESS NOTE   Diagnosis: Neuroendocrine tumor  INTERVAL HISTORY:   Ronald Thompson returns as scheduled.  He reports developing a severe case of "bronchitis "last month.  He did not sleep for 3 days secondary to a cough.  He was seen in the emergency room 11/19/2020.  A chest x-ray revealed no active disease.  He was prescribed doxycycline.  His symptoms resolved.  He has developed hoarseness and feels a fullness in the left ear.  We discussed the PET findings by telephone on 11/16/2020.  Objective:  Vital signs in last 24 hours:  Blood pressure 126/67, pulse 91, temperature 98.2 F (36.8 C), temperature source Oral, resp. rate 18, height 5\' 10"  (1.778 m), weight 169 lb (76.7 kg), SpO2 97 %.    HEENT: There is cerumen blocking the left external canal, pharynx without erythema or exudate Resp: Bronchial sounds at the upper posterior chest bilaterally, no respiratory distress Cardio: Regular rate and rhythm GI: No hepatosplenomegaly, nontender, no mass Vascular: No leg edema   Lab Results:  Lab Results  Component Value Date   WBC 4.3 10/11/2020   HGB 14.7 10/11/2020   HCT 42.6 10/11/2020   MCV 97.7 10/11/2020   PLT 126 (L) 10/11/2020   NEUTROABS 3.4 10/11/2020    CMP  Lab Results  Component Value Date   NA 138 10/11/2020   K 4.9 10/11/2020   CL 99 10/11/2020   CO2 31 10/11/2020   GLUCOSE 150 (H) 10/11/2020   BUN 22 10/11/2020   CREATININE 1.38 (H) 10/11/2020   CALCIUM 10.0 10/11/2020   PROT 7.6 10/11/2020   ALBUMIN 4.6 10/11/2020   AST 31 10/11/2020   ALT 25 10/11/2020   ALKPHOS 68 10/11/2020   BILITOT 1.1 10/11/2020   GFRNONAA 56 (L) 10/11/2020   GFRAA >60 01/19/2020     Medications: I have reviewed the patient's current medications.   Assessment/Plan: 1. Pancreatic neuroendocrine tumor, WHO grade 2, pancreatic head mass and small peripancreatic/celiac nodes on a CT 02/04/2014  EUS revealed evidence of multiple liver  metastases-status post an FNA biopsy of a left liver lesion 02/10/2014 confirming a neuroendocrine tumor   Octreotide scan 04/01/2014 with multiple foci of metastatic neuroendocrine tumor in the liver  Monthly Sandostatin started 03/16/2014  Restaging CT 07/04/2014 with resolution of a previously noted pancreas head lesion and probable progression of liver metastases  Restaging CT 10/31/2014-stable liver lesions, no evidence of a pancreas mass, stable.  Restaging CT 02/28/2015-stable hepatic metastases, stable pancreas lesion unchanged  Restaging CT 08/30/2015-stable pancreas mass, slight enlargement of hepatic metastases  Monthly Sandostatin continued  Restaging CTs06/14/2017 revealed enlargement of liver lesions, no new lesions, enlargement of the pancreas head mass  Gallium DOTATATEscan 10/01/2016 confirmed uptake in the liver metastases, pancreas primary, peripancreatic adenopathy, and left iliac bone  Cycle 1 Lutathera 03/05/2017  Cycle 2Lutathera09/26/2018  Cycle 3 Lutathera 07/02/2017  Cycle4Lutathera 08/27/2017  Netspotscan 09/29/2017-decreased radiotracer activity within the pancreas head,peripancreaticlymph node, and solitary skeletal lesion. Liver lesions have increased in size with a decrease in radiotracer activity  Monthly Sandostatin continued  Netspot scan 05/20/2018-stable to decreased size of liver lesions, most have decreased SUV, similar uptake in the pancreas lesion, previously noted peripancreatic lymph node has resolved him a mild decrease in uptake associated with a left iliac metastasis, no evidence of disease progression  Monthly Sandostatin continued  Netspot scan 01/29/2019-overall stable to improved, 1 lesion left hepatic lobe has increased tracer activity-unchanged in size, majority of hepatic lesions have reduced activity  compared to the pretreatment scan, decreased activity left iliac bone lesion, stable activity in the head of the pancreas  lesion, no new lesions  Monthly Sandostatin continued  Netspot on September 24, 2019-increase in size of 3 liver lesions with associated stable radiotracer activity, no new lesions, stable small left iliac lesion  Cycle 1 salvage Lutathera 10/27/2019  Cycle 2 salvage Lutathera 12/22/2019  Netspot on 02/15/2020-decrease in radiotracer activity in the liver metastases with a decrease in size of several lesions, no new lesions, no progressive disease in the bowel or mesentery, decreased activity in the left iliac bone lesion  Monthly Sandostatin continued   Netspot 10/13/2020-decrease in radiotracer activity in the left and right liver, decreased size of liver lesions, one lesion in the superior right liver has increased in size and has no radiotracer activity, no evidence of metastatic disease outside the liver  PET 11/15/2020- moderate hypermetabolic activity associated with several enlarging liver lesions that had limited activity on the dotatate PET, a lesion in the left lateral hepatic lobe with mild persistent dotatate activity has mild peripheral FDG activity and is decreased in size from a dotatate PET 1 year ago.  No hypermetabolic activity in the pancreas or bowel.  No hypermetabolic activity in the previous dotatate avid left iliac lesion  Monthly Sandostatin continued  2. Chest Seminoma in 1990 treated with BEP and chest radiation at [facility] 3. 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 chronic exertional dyspnea following treatment for the seminoma  5. aortic stenosis on an echocardiogram December 2011, severe aortic stenosis on echocardiogram 12/06/2015, status postTAVR on 04/16/2016 6. lumbar disc surgery 2014  7. acute abdominal pain 02/04/2014-resolved  8.Diabetes      Disposition: Ronald. Thompson has a metastatic  neuroendocrine tumor, likely arising in the pancreas.  He has been treated with 2 courses of Lutathera and continues Sandostatin.  His clinical status is stable.  A Netspot on 10/13/2020 revealed a lesion and enlarging lesion in the right liver without dotatate activity.  There lesions on the restaging PET with hypermetabolic activity with limited dotatate activity.  He may be developing areas of transformation to a higher grade neuroendocrine tumor.  I reviewed the PET images.  We will refer Ronald. Thompson to Dr. Leamon Arnt for review of the recent images and treatment recommendations.  He will continue Sandostatin for now.  He may be a candidate for a different systemic therapy regimen such as capecitabine/temozolomide, everolimus, or a tyrosine kinase inhibitor.  He will return for an office visit in 1 month.  He will see his primary provider to evaluate the left ear cerumen and hoarseness.   Betsy Coder, MD  12/19/2020  10:52 AM

## 2020-12-19 NOTE — Patient Instructions (Signed)
Octreotide injection solution What is this medicine? OCTREOTIDE (ok TREE oh tide) is used to reduce blood levels of growth hormone in patients with a condition called acromegaly. This medicine also reduces flushing and watery diarrhea caused by certain types of cancer. This medicine may be used for other purposes; ask your health care provider or pharmacist if you have questions. COMMON BRAND NAME(S): Bynfezia, Sandostatin What should I tell my health care provider before I take this medicine? They need to know if you have any of these conditions:  diabetes  gallbladder disease  kidney disease  liver disease  thyroid disease  an unusual or allergic reaction to octreotide, other medicines, foods, dyes, or preservatives  pregnant or trying to get pregnant  breast-feeding How should I use this medicine? This medicine is for injection under the skin or into a vein (only in emergency situations). It is usually given by a health care professional in a hospital or clinic setting. If you get this medicine at home, you will be taught how to prepare and give this medicine. Allow the injection solution to come to room temperature before use. Do not warm it artificially. Use exactly as directed. Take your medicine at regular intervals. Do not take your medicine more often than directed. It is important that you put your used needles and syringes in a special sharps container. Do not put them in a trash can. If you do not have a sharps container, call your pharmacist or healthcare provider to get one. Talk to your pediatrician regarding the use of this medicine in children. Special care may be needed. Overdosage: If you think you have taken too much of this medicine contact a poison control center or emergency room at once. NOTE: This medicine is only for you. Do not share this medicine with others. What if I miss a dose? If you miss a dose, take it as soon as you can. If it is almost time for your  next dose, take only that dose. Do not take double or extra doses. What may interact with this medicine?  bromocriptine  certain medicines for blood pressure, heart disease, irregular heartbeat  cyclosporine  diuretics  medicines for diabetes, including insulin  quinidine This list may not describe all possible interactions. Give your health care provider a list of all the medicines, herbs, non-prescription drugs, or dietary supplements you use. Also tell them if you smoke, drink alcohol, or use illegal drugs. Some items may interact with your medicine. What should I watch for while using this medicine? Visit your doctor or health care professional for regular checks on your progress. To help reduce irritation at the injection site, use a different site for each injection and make sure the solution is at room temperature before use. This medicine may cause decreases in blood sugar. Signs of low blood sugar include chills, cool, pale skin or cold sweats, drowsiness, extreme hunger, fast heartbeat, headache, nausea, nervousness or anxiety, shakiness, trembling, unsteadiness, tiredness, or weakness. Contact your doctor or health care professional right away if you experience any of these symptoms. This medicine may increase blood sugar. Ask your healthcare provider if changes in diet or medicines are needed if you have diabetes. This medicine may cause a decrease in vitamin B12. You should make sure that you get enough vitamin B12 while you are taking this medicine. Discuss the foods you eat and the vitamins you take with your health care professional. What side effects may I notice from receiving this medicine? Side   effects that you should report to your doctor or health care professional as soon as possible:  allergic reactions like skin rash, itching or hives, swelling of the face, lips, or tongue  fast, slow, or irregular heartbeat  right upper belly pain  severe stomach pain  signs  and symptoms of high blood sugar such as being more thirsty or hungry or having to urinate more than normal. You may also feel very tired or have blurry vision.  signs and symptoms of low blood sugar such as feeling anxious; confusion; dizziness; increased hunger; unusually weak or tired; increased sweating; shakiness; cold, clammy skin; irritable; headache; blurred vision; fast heartbeat; loss of consciousness  unusually weak or tired Side effects that usually do not require medical attention (report to your doctor or health care professional if they continue or are bothersome):  diarrhea  dizziness  gas  headache  nausea, vomiting  pain, redness, or irritation at site where injected  upset stomach This list may not describe all possible side effects. Call your doctor for medical advice about side effects. You may report side effects to FDA at 1-800-FDA-1088. Where should I keep my medicine? Keep out of the reach of children. Store in a refrigerator between 2 and 8 degrees C (36 and 46 degrees F). Protect from light. Allow to come to room temperature naturally. Do not use artificial heat. If protected from light, the injection may be stored at room temperature between 20 and 30 degrees C (70 and 86 degrees F) for 14 days. After the initial use, throw away any unused portion of a multiple dose vial after 14 days. Throw away unused portions of the ampules after use. NOTE: This sheet is a summary. It may not cover all possible information. If you have questions about this medicine, talk to your doctor, pharmacist, or health care provider.  2021 Elsevier/Gold Standard (2019-02-18 13:33:09)  

## 2020-12-19 NOTE — Addendum Note (Signed)
Addended by: Lenox Ponds E on: 12/19/2020 02:25 PM   Modules accepted: Orders

## 2020-12-26 ENCOUNTER — Telehealth (INDEPENDENT_AMBULATORY_CARE_PROVIDER_SITE_OTHER): Payer: Medicare Other | Admitting: Internal Medicine

## 2020-12-26 ENCOUNTER — Encounter: Payer: Self-pay | Admitting: Internal Medicine

## 2020-12-26 DIAGNOSIS — R062 Wheezing: Secondary | ICD-10-CM

## 2020-12-26 DIAGNOSIS — R0602 Shortness of breath: Secondary | ICD-10-CM

## 2020-12-26 DIAGNOSIS — R739 Hyperglycemia, unspecified: Secondary | ICD-10-CM

## 2020-12-26 DIAGNOSIS — R059 Cough, unspecified: Secondary | ICD-10-CM

## 2020-12-26 MED ORDER — ALBUTEROL SULFATE HFA 108 (90 BASE) MCG/ACT IN AERS: 1.0000 | INHALATION_SPRAY | Freq: Four times a day (QID) | RESPIRATORY_TRACT | 4 refills | Status: DC | PRN

## 2020-12-26 MED ORDER — PREDNISONE 10 MG PO TABS: ORAL_TABLET | ORAL | 0 refills | Status: DC

## 2020-12-26 MED ORDER — AZITHROMYCIN 250 MG PO TABS: ORAL_TABLET | ORAL | 1 refills | Status: AC

## 2020-12-26 NOTE — Progress Notes (Signed)
Patient ID: Ronald Thompson, male   DOB: 31-Mar-1956, 65 y.o.   MRN: [id number]  Virtual Visit via Video Note  I connected with Ronald Thompson on 12/26/20 at  2:20 PM EDT by a video enabled telemedicine application and verified that I am speaking with the correct person using two identifiers.  Location of all participants today Patient: at home Provider: at office   I discussed the limitations of evaluation and management by telemedicine and the availability of in person appointments. The patient expressed understanding and agreed to proceed.  History of Present Illness: Here with acute onset mild to mod 2-3 days ST, HA, general weakness and malaise, with prod cough greenish sputum, but Pt denies chest pain, increased sob or doe, wheezing, orthopnea, PND, increased LE swelling, palpitations, dizziness or syncope, except for onset mild wheezing yesterday with sob.   Pt denies polydipsia, polyuria, or new focal neuro s/s.   Past Medical History:  Diagnosis Date  . Baker's cyst   . Gallstones   . Heart murmur   . Hypercholesteremia   . Hypertension   . Lesion of left lung    hx.25 yrs ago- testicilar cancer related- only scarring left after tx.-no problems now  . Liver metastasis ([place])   . 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  . Pericarditis    2nd to tumor  . Pneumonia 1990  . Primary neuroendocrine tumor of pancreas 02/14/2014   Stage IV with multiple mets to liver  . Pulmonary fibrosis ([place]) 11/08/2015  . S/P TAVR (transcatheter aortic valve replacement) 04/16/2016   26 mm Edwards Sapien 3 transcatheter heart valve placed via percutaneous right transfemoral approach   . Shortness of breath dyspnea    with exertion and when tired  . 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 Transfusion history    hx. 25 yrs ago-during cancer tx.    Past Surgical History:  Procedure Laterality Date  . BACK SURGERY     '14- rupt. disc   . CARDIAC CATHETERIZATION N/A 01/24/2016   Procedure: Right/Left Heart Cath and Coronary Angiography;  Surgeon: [name], MD;  Location: [place] [facility];  Service: Cardiovascular;  Laterality: N/A;  . COLONOSCOPY    . EUS N/A 02/10/2014   Procedure: UPPER ENDOSCOPIC ULTRASOUND (EUS) LINEAR;  Surgeon: [name], MD;  Location: [facility];  Service: Endoscopy;  Laterality: N/A;  . IR RADIOLOGIST EVAL & MGMT  02/11/2017  . KNEE SURGERY Right    age 73 for Baker's cyst  . NASAL SEPTUM SURGERY     Deviated septrum  . TEE WITHOUT CARDIOVERSION N/A 04/16/2016   Procedure: TRANSESOPHAGEAL ECHOCARDIOGRAM (TEE);  Surgeon: [name], MD;  Location: [facility];  Service: Open Heart Surgery;  Laterality: N/A;  . THORACOTOMY  1990   wedge biopsy of mediastinal mass  . TRANSCATHETER AORTIC VALVE REPLACEMENT, TRANSFEMORAL N/A 04/16/2016   Procedure: TRANSCATHETER AORTIC VALVE REPLACEMENT, TRANSFEMORAL;  Surgeon: [name], MD;  Location: [place];  Service: Open Heart Surgery;  Laterality: N/A;    reports that he has never smoked. He has never used smokeless tobacco. He reports current alcohol use. He reports that he does not use drugs. family history includes Cancer in his maternal aunt and paternal grandfather; Dementia in his mother; Emphysema in his maternal aunt. Allergies  Allergen Reactions  . Penicillins Hives  ENTIRE BODY Has patient had a PCN reaction causing immediate rash, facial/tongue/throat swelling, SOB or lightheadedness with hypotension: No Has patient had a PCN reaction causing severe rash involving mucus membranes or skin necrosis: No Has patient had a PCN reaction that required hospitalization * *  YES  * * Has patient had a PCN reaction occurring within the last 10 years: No If all of the above answers are "NO", then may proceed with Cephalosporin use. *reaction occurred  when he was 17   Current Outpatient Medications on File Prior to Visit  Medication Sig Dispense Refill  . acetaminophen (TYLENOL) 500 MG tablet Take 1,000 mg by mouth every 6 (six) hours as needed for headache.    . Blood Pressure Monitoring (BLOOD PRESSURE CUFF) MISC Use as directed 1 each 0  . calcium carbonate (TUMS - DOSED IN MG ELEMENTAL CALCIUM) 500 MG chewable tablet Chew 3 tablets by mouth 3 (three) times daily as needed for indigestion or heartburn.  (Patient not taking: Reported on 12/19/2020)    . clindamycin (CLEOCIN) 300 MG capsule Take 2 capsules by mouth one hour prior to dental appointment (Patient not taking: Reported on 12/19/2020) 6 capsule 2  . docusate sodium (COLACE) 100 MG capsule Take 100 mg by mouth daily as needed for mild constipation.    . gabapentin (NEURONTIN) 300 MG capsule TAKE ONE CAPSULE BY MOUTH THREE TIMES A DAY   START AFRER 100 MG INTIAL RX (Patient not taking: Reported on 12/19/2020) 90 capsule 1  . glucose blood (ONETOUCH VERIO) test strip Use as instructed to test blood sugar one time daily E11.65 100 each 12  . ibuprofen (ADVIL,MOTRIN) 200 MG tablet Take 400 mg by mouth every 6 (six) hours as needed for headache or mild pain.     Elmore Guise Device MISC Use as instructed to test blood sugar daily E11.65 1 each 2  . losartan-hydrochlorothiazide (HYZAAR) 100-25 MG tablet TAKE ONE TABLET BY MOUTH DAILY 90 tablet 2  . metFORMIN (GLUCOPHAGE-XR) 500 MG 24 hr tablet Take 1 tablet (500 mg total) by mouth daily with breakfast. (Patient not taking: Reported on 12/19/2020) 90 tablet 3  . Octreotide Acetate (SANDOSTATIN IJ) Inject 1 each as directed every 30 (thirty) days.     . ondansetron (ZOFRAN) 8 MG tablet Take 1 tablet (8 mg total) by mouth 2 (two) times daily as needed for nausea or vomiting. (Patient not taking: Reported on 12/19/2020) 20 tablet 0  . OneTouch Delica Lancets 76L MISC Use as instructed to test blood sugar once daily E11.65 150 each 6  . sildenafil  (VIAGRA) 100 MG tablet TAKE 1/2 TO 1 TABLET BY MOUTH DAILY AS NEEDED FOR FOR ERECTILE DYSFUNCTION 5 tablet 10  . simvastatin (ZOCOR) 20 MG tablet TAKE ONE TABLET BY MOUTH DAILY AT 6PM 90 tablet 3   Current Facility-Administered Medications on File Prior to Visit  Medication Dose Route Frequency Provider Last Rate Last Admin  . heparin lock flush 100 unit/mL  500 Units Intravenous Once Betsy Coder B, MD      . octreotide (SANDOSTATIN LAR) 30 MG IM injection           . sodium chloride flush (NS) 0.9 % injection 10 mL  10 mL Intracatheter PRN Ladell Pier, MD        Observations/Objective: Alert, NAD, appropriate mood and affect, resps normal, cn 2-12 intact, moves all 4s, no visible rash or swelling Lab Results  Component Value Date   WBC 4.3 10/11/2020  HGB 14.7 10/11/2020   HCT 42.6 10/11/2020   PLT 126 (L) 10/11/2020   GLUCOSE 150 (H) 10/11/2020   CHOL 136 10/19/2020   TRIG 60.0 10/19/2020   HDL 51.10 10/19/2020   LDLDIRECT 139.2 10/10/2008   LDLCALC 73 10/19/2020   ALT 25 10/11/2020   AST 31 10/11/2020   NA 138 10/11/2020   K 4.9 10/11/2020   CL 99 10/11/2020   CREATININE 1.38 (H) 10/11/2020   BUN 22 10/11/2020   CO2 31 10/11/2020   TSH 1.10 10/19/2020   PSA 1.88 10/19/2020   INR 1.30 04/16/2016   HGBA1C 6.1 10/19/2020   MICROALBUR 1.9 01/17/2020   Assessment and Plan: See notes  Follow Up Instructions: See notes   I discussed the assessment and treatment plan with the patient. The patient was provided an opportunity to ask questions and all were answered. The patient agreed with the plan and demonstrated an understanding of the instructions.   The patient was advised to call back or seek an in-person evaluation if the symptoms worsen or if the condition fails to improve as anticipated.   Cathlean Cower, MD

## 2020-12-26 NOTE — Patient Instructions (Signed)
Please take all new medication as prescribed 

## 2020-12-26 NOTE — Assessment & Plan Note (Signed)
Lab Results  Component Value Date   HGBA1C 6.1 10/19/2020   Stable, pt to continue current medical treatment - metformin

## 2020-12-26 NOTE — Assessment & Plan Note (Signed)
Mild to mod, for antibx course,  to f/u any worsening symptoms or concerns 

## 2020-12-26 NOTE — Assessment & Plan Note (Signed)
Mild to mod, for predpac asd, inhaler prn, to f/u any worsening symptoms or concerns 

## 2021-01-15 DIAGNOSIS — C7A8 Other malignant neuroendocrine tumors: Secondary | ICD-10-CM | POA: Diagnosis not present

## 2021-01-17 ENCOUNTER — Encounter: Payer: Self-pay | Admitting: Oncology

## 2021-01-17 ENCOUNTER — Other Ambulatory Visit (HOSPITAL_COMMUNITY): Payer: Self-pay

## 2021-01-17 ENCOUNTER — Inpatient Hospital Stay: Payer: Medicare Other | Attending: Oncology | Admitting: Oncology

## 2021-01-17 ENCOUNTER — Inpatient Hospital Stay: Payer: Medicare Other

## 2021-01-17 ENCOUNTER — Other Ambulatory Visit: Payer: Self-pay

## 2021-01-17 ENCOUNTER — Other Ambulatory Visit: Payer: Self-pay | Admitting: *Deleted

## 2021-01-17 ENCOUNTER — Telehealth: Payer: Self-pay | Admitting: Pharmacy Technician

## 2021-01-17 ENCOUNTER — Other Ambulatory Visit: Payer: Self-pay | Admitting: Oncology

## 2021-01-17 VITALS — BP 116/65 | HR 60 | Temp 97.8°F | Resp 19 | Ht 70.0 in | Wt 168.6 lb

## 2021-01-17 DIAGNOSIS — C7A8 Other malignant neuroendocrine tumors: Secondary | ICD-10-CM | POA: Insufficient documentation

## 2021-01-17 DIAGNOSIS — Z86718 Personal history of other venous thrombosis and embolism: Secondary | ICD-10-CM | POA: Insufficient documentation

## 2021-01-17 DIAGNOSIS — D3A8 Other benign neuroendocrine tumors: Secondary | ICD-10-CM

## 2021-01-17 DIAGNOSIS — E119 Type 2 diabetes mellitus without complications: Secondary | ICD-10-CM | POA: Diagnosis not present

## 2021-01-17 DIAGNOSIS — Z79899 Other long term (current) drug therapy: Secondary | ICD-10-CM | POA: Diagnosis not present

## 2021-01-17 DIAGNOSIS — C7B8 Other secondary neuroendocrine tumors: Secondary | ICD-10-CM | POA: Diagnosis not present

## 2021-01-17 DIAGNOSIS — R252 Cramp and spasm: Secondary | ICD-10-CM | POA: Insufficient documentation

## 2021-01-17 LAB — CBC WITH DIFFERENTIAL (CANCER CENTER ONLY)
Abs Immature Granulocytes: 0.01 10*3/uL (ref 0.00–0.07)
Basophils Absolute: 0 10*3/uL (ref 0.0–0.1)
Basophils Relative: 0 %
Eosinophils Absolute: 0.1 10*3/uL (ref 0.0–0.5)
Eosinophils Relative: 1 %
HCT: 41.1 % (ref 39.0–52.0)
Hemoglobin: 13.7 g/dL (ref 13.0–17.0)
Immature Granulocytes: 0 %
Lymphocytes Relative: 10 %
Lymphs Abs: 0.4 10*3/uL — ABNORMAL LOW (ref 0.7–4.0)
MCH: 33.1 pg (ref 26.0–34.0)
MCHC: 33.3 g/dL (ref 30.0–36.0)
MCV: 99.3 fL (ref 80.0–100.0)
Monocytes Absolute: 0.6 10*3/uL (ref 0.1–1.0)
Monocytes Relative: 14 %
Neutro Abs: 3.4 10*3/uL (ref 1.7–7.7)
Neutrophils Relative %: 75 %
Platelet Count: 119 10*3/uL — ABNORMAL LOW (ref 150–400)
RBC: 4.14 MIL/uL — ABNORMAL LOW (ref 4.22–5.81)
RDW: 12.7 % (ref 11.5–15.5)
WBC Count: 4.5 10*3/uL (ref 4.0–10.5)
nRBC: 0 % (ref 0.0–0.2)

## 2021-01-17 LAB — CMP (CANCER CENTER ONLY)
ALT: 17 U/L (ref 0–44)
AST: 25 U/L (ref 15–41)
Albumin: 4.3 g/dL (ref 3.5–5.0)
Alkaline Phosphatase: 59 U/L (ref 38–126)
Anion gap: 7 (ref 5–15)
BUN: 32 mg/dL — ABNORMAL HIGH (ref 8–23)
CO2: 31 mmol/L (ref 22–32)
Calcium: 9.7 mg/dL (ref 8.9–10.3)
Chloride: 101 mmol/L (ref 98–111)
Creatinine: 1.23 mg/dL (ref 0.61–1.24)
GFR, Estimated: >60 mL/min (ref >60)
Glucose, Bld: 99 mg/dL (ref 70–99)
Potassium: 4.2 mmol/L (ref 3.5–5.1)
Sodium: 139 mmol/L (ref 135–145)
Total Bilirubin: 1.1 mg/dL (ref 0.3–1.2)
Total Protein: 7 g/dL (ref 6.5–8.1)

## 2021-01-17 MED ORDER — ONDANSETRON HCL 8 MG PO TABS: 8.0000 mg | ORAL_TABLET | ORAL | 1 refills | Status: DC

## 2021-01-17 MED ORDER — OCTREOTIDE ACETATE 30 MG IM KIT
30.0000 mg | PACK | Freq: Once | INTRAMUSCULAR | Status: AC
  Administered 2021-01-17: 30 mg via INTRAMUSCULAR

## 2021-01-17 MED ORDER — TEMOZOLOMIDE 180 MG PO CAPS
360.0000 mg | ORAL_CAPSULE | Freq: Every day | ORAL | 0 refills | Status: DC
  Filled 2021-01-17: qty 14, 7d supply, fill #0

## 2021-01-17 MED ORDER — CAPECITABINE 500 MG PO TABS
ORAL_TABLET | ORAL | 0 refills | Status: DC
  Filled 2021-01-17: qty 70, fill #0

## 2021-01-17 NOTE — Progress Notes (Signed)
Ronald Thompson OFFICE VISIT PROGRESS NOTE  I connected with Ronald Thompson on 01/17/21 at  9:30 AM EDT by video enabled telemedicine visit and verified that I am speaking with the correct person using two identifiers.   I discussed the limitations, risks, security and privacy concerns of performing an evaluation and management service by telemedicine and the availability of in-person appointments. I also discussed with the patient that there may be a patient responsible charge related to this service. The patient expressed understanding and agreed to proceed.  Patient's location: Office Provider's location: Home   Diagnosis: Pancreas neuroendocrine tumor  INTERVAL HISTORY:  Ronald Thompson returns for a scheduled visit.  He generally feels well.  Is noted some cramping in the legs.  No swelling.  Good appetite.  He is exercising.  No difficulty with bowel function.  No abdominal pain. He saw Dr. Leamon Arnt on 01/15/2021.  Dr. Leamon Arnt agrees the tumor is progressing with evidence of dedifferentiation.  He recommends beginning a new systemic therapy.  They discussed various treatment options including capecitabine/temozolomide, FOLFOX, Afinitor, and tyrosine kinase inhibitors.  Ronald Thompson prefers capecitabine/temozolomide.  Objective:  Vital signs in last 24 hours:  Blood pressure 116/65, pulse 60, temperature 97.8 F (36.6 C), temperature source Oral, resp. rate 19, height 5\' 10"  (1.778 m), weight 168 lb 9.6 oz (76.5 kg), SpO2 96 %.     Lab Results:  Lab Results  Component Value Date   WBC 4.3 10/11/2020   HGB 14.7 10/11/2020   HCT 42.6 10/11/2020   MCV 97.7 10/11/2020   PLT 126 (L) 10/11/2020   NEUTROABS 3.4 10/11/2020    Imaging:  No results found.  Medications: I have reviewed the patient's current medications.  Assessment/Plan: Pancreatic neuroendocrine tumor, WHO grade 2, pancreatic head mass and small peripancreatic/celiac nodes on a CT  02/04/2014 EUS revealed evidence of multiple liver metastases-status post an FNA biopsy of a left liver lesion 02/10/2014 confirming a neuroendocrine tumor   Octreotide scan 04/01/2014 with multiple foci of metastatic neuroendocrine tumor in the liver Monthly Sandostatin started 03/16/2014 Restaging CT 07/04/2014 with resolution of a previously noted pancreas head lesion and probable progression of liver metastases Restaging CT 10/31/2014-stable liver lesions, no evidence of a pancreas mass, stable. Restaging CT 02/28/2015-stable hepatic metastases, stable pancreas lesion unchanged Restaging CT 08/30/2015-stable pancreas mass, slight enlargement of hepatic metastases Monthly Sandostatin continued Restaging CTs 01/17/2016 revealed enlargement of liver lesions, no new lesions, enlargement of the pancreas head mass Gallium DOTATATE scan 10/01/2016 confirmed uptake in the liver metastases, pancreas primary, peripancreatic adenopathy, and left iliac bone Cycle 1 Lutathera 03/05/2017 Cycle 2 Lutathera 04/30/2017 Cycle 3 Lutathera 07/02/2017 Cycle 4 Lutathera 08/27/2017 Netspot scan 09/29/2017-decreased radiotracer activity within the pancreas head, peripancreatic lymph node, and solitary skeletal lesion.  Liver lesions have increased in size with a decrease in radiotracer activity Monthly Sandostatin continued Netspot scan 05/20/2018-stable to decreased size of liver lesions, most have decreased SUV, similar uptake in the pancreas lesion, previously noted peripancreatic lymph node has resolved him a mild decrease in uptake associated with a left iliac metastasis, no evidence of disease progression Monthly Sandostatin continued Netspot scan 01/29/2019- overall stable to improved, 1 lesion left hepatic lobe has increased tracer activity-unchanged in size, majority of hepatic lesions have reduced activity compared to the pretreatment scan, decreased activity left iliac bone lesion, stable activity in the head  of the pancreas lesion, no new lesions Monthly Sandostatin continued Netspot on September 24, 2019-increase in size  of 3 liver lesions with associated stable radiotracer activity, no new lesions, stable small left iliac lesion Cycle 1 salvage Lutathera 10/27/2019 Cycle 2 salvage Lutathera 12/22/2019 Netspot on 02/15/2020-decrease in radiotracer activity in the liver metastases with a decrease in size of several lesions, no new lesions, no progressive disease in the bowel or mesentery, decreased activity in the left iliac bone lesion Monthly Sandostatin continued  Netspot 10/13/2020-decrease in radiotracer activity in the left and right liver, decreased size of liver lesions, one lesion in the superior right liver has increased in size and has no radiotracer activity, no evidence of metastatic disease outside the liver PET 11/15/2020- moderate hypermetabolic activity associated with several enlarging liver lesions that had limited activity on the dotatate PET, a lesion in the left lateral hepatic lobe with mild persistent dotatate activity has mild peripheral FDG activity and is decreased in size from a dotatate PET 1 year ago.  No hypermetabolic activity in the pancreas or bowel.  No hypermetabolic activity in the previous dotatate avid left iliac lesion Monthly Sandostatin continued    Chest Seminoma in 1990 treated with BEP and chest radiation at [facility] 3. 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 chronic exertional dyspnea following treatment for the seminoma   5. aortic stenosis on an echocardiogram December 2011, severe aortic stenosis on echocardiogram 12/06/2015, status post TAVR on 04/16/2016 6. lumbar disc surgery 2014   7. acute abdominal pain 02/04/2014-resolved   8.  Diabetes       Disposition: Ronald Thompson has a metastatic neuroendocrine  tumor.  There is evidence of disease progression while on Sandostatin.  He saw Dr. Leamon Arnt earlier this week.  Dr. Leamon Arnt contacted me by telephone on 01/15/2021.  He recommends proceeding with capecitabine and temozolomide therapy.  I reviewed the capecitabine/temozolomide regimen with Ronald Thompson.  We reviewed potential toxicities including the chance of nausea/vomiting, mucositis, diarrhea, and hematologic toxicity.  We discussed the rash, sun sensitivity, hyperpigmentation, and hand/foot syndrome associated with capecitabine.  He understands we have a special concern for hematologic toxicity given his treatment with Lutathera and remote chemotherapy for the seminoma.  Ronald Thompson would like to proceed with capecitabine/temozolomide.  Capecitabine will be given twice daily on days 1 through 14 of each 28-day cycle.  Temozolomide will be taken on days 10-14.  He will be contacted by the Cancer center pharmacist to discuss side effects and arrange for drug delivery.  The plan is to begin cycle 1 on 01/22/2021.  He will return for an office and lab visit on 02/06/2021.  He knows to hold chemotherapy and contact us for diarrhea, bleeding, or fever.  We will recommend antiemetic prophylaxis when taking temozolomide.  Ronald Thompson will continue monthly Sandostatin.   I discussed the assessment and treatment plan with the patient. The patient was provided an opportunity to ask questions and all were answered. The patient agreed with the plan and demonstrated an understanding of the instructions.   The patient was advised to call back or seek an in-person evaluation if the symptoms worsen or if the condition fails to improve as anticipated.  I provided 40 minutes of chart review, video, and documentation time during this encounter, and > 50% was spent counseling as documented under my assessment & plan.  Betsy Coder ANP/GNP-BC   01/17/2021 9:29 AM

## 2021-01-17 NOTE — Patient Instructions (Signed)
Octreotide injection solution What is this medication? OCTREOTIDE (ok TREE oh tide) is used to reduce blood levels of growth hormone in patients with a condition called acromegaly. This medicine also reduces flushing and watery diarrhea caused by certain types of cancer. This medicine may be used for other purposes; ask your health care provider or pharmacist if you have questions. COMMON BRAND NAME(S): Bynfezia, Sandostatin What should I tell my care team before I take this medication? They need to know if you have any of these conditions: diabetes gallbladder disease kidney disease liver disease thyroid disease an unusual or allergic reaction to octreotide, other medicines, foods, dyes, or preservatives pregnant or trying to get pregnant breast-feeding How should I use this medication? This medicine is for injection under the skin or into a vein (only in emergency situations). It is usually given by a health care professional in a hospital or clinic setting. If you get this medicine at home, you will be taught how to prepare and give this medicine. Allow the injection solution to come to room temperature before use. Do not warm it artificially. Use exactly as directed. Take your medicine at regular intervals. Do not take your medicine more often than directed. It is important that you put your used needles and syringes in a special sharps container. Do not put them in a trash can. If you do not have a sharps container, call your pharmacist or healthcare provider to get one. Talk to your pediatrician regarding the use of this medicine in children. Special care may be needed. Overdosage: If you think you have taken too much of this medicine contact a poison control center or emergency room at once. NOTE: This medicine is only for you. Do not share this medicine with others. What if I miss a dose? If you miss a dose, take it as soon as you can. If it is almost time for your next dose, take only  that dose. Do not take double or extra doses. What may interact with this medication? bromocriptine certain medicines for blood pressure, heart disease, irregular heartbeat cyclosporine diuretics medicines for diabetes, including insulin quinidine This list may not describe all possible interactions. Give your health care provider a list of all the medicines, herbs, non-prescription drugs, or dietary supplements you use. Also tell them if you smoke, drink alcohol, or use illegal drugs. Some items may interact with your medicine. What should I watch for while using this medication? Visit your doctor or health care professional for regular checks on your progress. To help reduce irritation at the injection site, use a different site for each injection and make sure the solution is at room temperature before use. This medicine may cause decreases in blood sugar. Signs of low blood sugar include chills, cool, pale skin or cold sweats, drowsiness, extreme hunger, fast heartbeat, headache, nausea, nervousness or anxiety, shakiness, trembling, unsteadiness, tiredness, or weakness. Contact your doctor or health care professional right away if you experience any of these symptoms. This medicine may increase blood sugar. Ask your healthcare provider if changes in diet or medicines are needed if you have diabetes. This medicine may cause a decrease in vitamin B12. You should make sure that you get enough vitamin B12 while you are taking this medicine. Discuss the foods you eat and the vitamins you take with your health care professional. What side effects may I notice from receiving this medication? Side effects that you should report to your doctor or health care professional as soon as   possible: allergic reactions like skin rash, itching or hives, swelling of the face, lips, or tongue fast, slow, or irregular heartbeat right upper belly pain severe stomach pain signs and symptoms of high blood sugar such  as being more thirsty or hungry or having to urinate more than normal. You may also feel very tired or have blurry vision. signs and symptoms of low blood sugar such as feeling anxious; confusion; dizziness; increased hunger; unusually weak or tired; increased sweating; shakiness; cold, clammy skin; irritable; headache; blurred vision; fast heartbeat; loss of consciousness unusually weak or tired Side effects that usually do not require medical attention (report to your doctor or health care professional if they continue or are bothersome): diarrhea dizziness gas headache nausea, vomiting pain, redness, or irritation at site where injected upset stomach This list may not describe all possible side effects. Call your doctor for medical advice about side effects. You may report side effects to FDA at 1-800-FDA-1088. Where should I keep my medication? Keep out of the reach of children. Store in a refrigerator between 2 and 8 degrees C (36 and 46 degrees F). Protect from light. Allow to come to room temperature naturally. Do not use artificial heat. If protected from light, the injection may be stored at room temperature between 20 and 30 degrees C (70 and 86 degrees F) for 14 days. After the initial use, throw away any unused portion of a multiple dose vial after 14 days. Throw away unused portions of the ampules after use. NOTE: This sheet is a summary. It may not cover all possible information. If you have questions about this medicine, talk to your doctor, pharmacist, or health care provider.  2022 Elsevier/Gold Standard (2019-02-18 13:33:09)  

## 2021-01-17 NOTE — Telephone Encounter (Addendum)
Oral Oncology Patient Advocate Encounter  After completing a benefits investigation, prior authorization for Capecitabine and Temozolomide is not required at this time through Medicare Part B.  Patients copays are $0.00 for Capecitabine and $0.00 for Temozolomide.  [place] [facility] [place] Phone [phone] Fax [phone] 01/18/2021 11:23 AM

## 2021-01-17 NOTE — Progress Notes (Signed)
Provided reading sheets on xeloda and temodar and explained process for getting these approved. He will receive call from oral oncology pharmacist to go over meds/side effects and management. Could take up to 5 business days, depending on his insurance.

## 2021-01-17 NOTE — Progress Notes (Signed)
Per Dr. [name]: Send script for zofran 8 mg 30 minutes prior to each Temodar dose and every 8 hours prn

## 2021-01-18 ENCOUNTER — Encounter: Payer: Self-pay | Admitting: *Deleted

## 2021-01-18 ENCOUNTER — Other Ambulatory Visit (HOSPITAL_COMMUNITY): Payer: Self-pay

## 2021-01-18 ENCOUNTER — Encounter: Payer: Self-pay | Admitting: Oncology

## 2021-01-18 ENCOUNTER — Ambulatory Visit (INDEPENDENT_AMBULATORY_CARE_PROVIDER_SITE_OTHER): Payer: Medicare Other | Admitting: Internal Medicine

## 2021-01-18 ENCOUNTER — Telehealth: Payer: Self-pay | Admitting: Pharmacist

## 2021-01-18 ENCOUNTER — Encounter: Payer: Self-pay | Admitting: Internal Medicine

## 2021-01-18 VITALS — BP 132/88 | HR 64 | Ht 70.0 in | Wt 168.0 lb

## 2021-01-18 DIAGNOSIS — D3A8 Other benign neuroendocrine tumors: Secondary | ICD-10-CM

## 2021-01-18 DIAGNOSIS — E119 Type 2 diabetes mellitus without complications: Secondary | ICD-10-CM | POA: Diagnosis not present

## 2021-01-18 LAB — CHROMOGRANIN A: Chromogranin A (ng/mL): 9202 ng/mL — ABNORMAL HIGH (ref 0.0–101.8)

## 2021-01-18 LAB — POCT GLYCOSYLATED HEMOGLOBIN (HGB A1C): Hemoglobin A1C: 5.7 % — AB (ref 4.0–5.6)

## 2021-01-18 MED ORDER — CAPECITABINE 500 MG PO TABS
ORAL_TABLET | ORAL | 0 refills | Status: DC
Start: 2021-01-22 — End: 2021-01-18
  Filled 2021-01-18: qty 70, fill #0

## 2021-01-18 MED ORDER — ONDANSETRON HCL 8 MG PO TABS
8.0000 mg | ORAL_TABLET | ORAL | 1 refills | Status: DC
  Filled 2021-01-18: qty 30, 10d supply, fill #0
  Filled 2021-07-12: qty 30, 10d supply, fill #1

## 2021-01-18 MED ORDER — CAPECITABINE 500 MG PO TABS
ORAL_TABLET | ORAL | 0 refills | Status: DC
  Filled 2021-01-18: qty 70, 14d supply, fill #0

## 2021-01-18 MED ORDER — ONETOUCH VERIO VI STRP: 1.0000 | ORAL_STRIP | Freq: Every day | 3 refills | Status: DC

## 2021-01-18 MED ORDER — TEMOZOLOMIDE 180 MG PO CAPS
360.0000 mg | ORAL_CAPSULE | Freq: Every day | ORAL | 0 refills | Status: DC
  Filled 2021-01-18: qty 10, 28d supply, fill #0

## 2021-01-18 NOTE — Progress Notes (Signed)
Name: Ronald Thompson  Age/ Sex: 65 y.o., male   MRN/ DOB: [id number], [birth year]     PCP: [name], MD   Reason for Endocrinology Evaluation: Type 2 Diabetes Mellitus  Initial Endocrine Consultative Visit: 10/11/2019    PATIENT IDENTIFIER: Ronald Thompson is a 64 y.o. male with a past medical history of HTN and pancreatic neuroendocrine tumor Dx7/11/2013), Hx of lung seminoma 1990 and S/P TAVR 2017. The patient has followed with Endocrinology clinic since 10/11/2019 for consultative assistance with management of his diabetes.  DIABETIC HISTORY:  Ronald Thompson was diagnosed with DM in 09/2019. He was started on Metformin. His hemoglobin A1c was 9.7% on initial diagnosis.   Was diagnosed with neuroendocrine tumor in 2015, he is status post Lutathera , and has been on octreotide treatments since 2015.    Metformin stopped in 07/2020 SUBJECTIVE:   During the last visit (07/20/2020): A1c 5.6%. Stopped  metformin      Today (01/18/2021): Ronald Thompson is here for a follow up on diabetes management.  He checks his blood sugars 3 times daily, week. The patient has not had hypoglycemic episodes since the last clinic visit  Last treatment for neuroendocrine tumor was in 12/2019. Has a pending scan in 02/2020. He did have nausea and vomiting following the treatment but otherwise tolerating metformin well.     Denies nausea and diarrhea  Has been more tired  Has noted achy legs   Follows with Dr. Ammie Dalton for neuroendcrine tumor , there has been evidence of progression with dedifferentiation , receives Octreotide infusions , starting chemo with Capecitabine/temozolomide 01/22/2021  ED visit for SOB in 11/2020   HOME DIABETES REGIMEN:  N/A      METER DOWNLOAD SUMMARY: Date range evaluated: 6/2-6/16/2022 Average Number Tests/Day = 0.3 Overall Mean FS Glucose = 110   BG Ranges: Low = 101 High = 116   Hypoglycemic Events/30 Days: BG < 50 = 0 Episodes of symptomatic severe hypoglycemia =  0      DIABETIC COMPLICATIONS: Microvascular complications:    Denies: CKD, retinopathy, neuropathy Last eye exam: Completed 2020   Macrovascular complications:  S/P Aortic valve replacement.  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 25 yrs ago-during cancer tx.   Past Surgical History:  Past Surgical History:  Procedure Laterality Date   BACK SURGERY     '14- rupt. disc    CARDIAC  CATHETERIZATION N/A 01/24/2016   Procedure: Right/Left Heart Cath and Coronary Angiography;  Surgeon: [name], MD;  Location: [facility];  Service: Cardiovascular;  Laterality: N/A;   COLONOSCOPY     EUS N/A 02/10/2014   Procedure: UPPER ENDOSCOPIC ULTRASOUND (EUS) LINEAR;  Surgeon: [name], MD;  Location: [facility];  Service: Endoscopy;  Laterality: N/A;   IR RADIOLOGIST EVAL & MGMT  02/11/2017   KNEE SURGERY Right    age 55 for Baker's cyst   NASAL SEPTUM SURGERY     Deviated septrum   TEE WITHOUT CARDIOVERSION N/A  04/16/2016   Procedure: TRANSESOPHAGEAL ECHOCARDIOGRAM (TEE);  Surgeon: [name], MD;  Location: [facility];  Service: Open Heart Surgery;  Laterality: N/A;   THORACOTOMY  1990   wedge biopsy of mediastinal mass   TRANSCATHETER AORTIC VALVE REPLACEMENT, TRANSFEMORAL N/A 04/16/2016   Procedure: TRANSCATHETER AORTIC VALVE REPLACEMENT, TRANSFEMORAL;  Surgeon: [name], MD;  Location: [facility];  Service: Open Heart Surgery;  Laterality: N/A;   Social History:  reports that he has never smoked. He has never used smokeless tobacco. He reports current alcohol use. He reports that he does not use drugs. Family History:  Family History  Problem Relation Age of Onset   Dementia Mother    Cancer Paternal Grandfather        esophagus   Cancer Maternal Aunt        colon   Emphysema Maternal Aunt      HOME MEDICATIONS: Allergies as of 01/18/2021       Reactions   Penicillins Hives   ENTIRE BODY Has patient had a PCN reaction causing immediate rash, facial/tongue/throat swelling, SOB or lightheadedness with hypotension: No Has patient had a PCN reaction causing severe rash involving mucus membranes or skin necrosis: No Has patient had a PCN reaction that required hospitalization * *  YES  * * Has patient had a PCN reaction occurring within the last 10 years: No If all of the above answers are "NO", then may proceed with Cephalosporin use. *reaction occurred when he was 17        Medication List        Accurate as of January 18, 2021  6:42 PM. If you have any questions, ask your nurse or doctor.          acetaminophen 500 MG tablet Commonly known as: TYLENOL Take 1,000 mg by mouth every 6 (six) hours as needed for headache.   albuterol 108 (90 Base) MCG/ACT inhaler Commonly known as: VENTOLIN HFA Inhale 1-2 puffs into the lungs every 6 (six) hours as needed for wheezing or shortness of breath.   Blood Pressure Cuff Misc Use as directed   calcium carbonate 500 MG chewable  tablet Commonly known as: TUMS - dosed in mg elemental calcium Chew 3 tablets by mouth 3 (three) times daily as needed for indigestion or heartburn.   capecitabine 500 MG tablet Commonly known as: XELODA Take 3 tablets (1500 mg) in the morning and 2 tablets (1000 mg) in the evening. Take with food. Take on days 1 - 14 of each 28 day cycle. Start taking on: January 22, 2021 What changed:  additional instructions These instructions start on January 22, 2021. If you are unsure what to do until then, ask your doctor or other care provider. Changed by: Darl Pikes, RPH-CPP   clindamycin 300 MG capsule Commonly known as: CLEOCIN Take 2 capsules by mouth one hour prior to dental appointment  docusate sodium 100 MG capsule Commonly known as: COLACE Take 100 mg by mouth daily as needed for mild constipation.   gabapentin 300 MG capsule Commonly known as: NEURONTIN TAKE ONE CAPSULE BY MOUTH THREE TIMES A DAY   START AFRER 100 MG INTIAL RX   ibuprofen 200 MG tablet Commonly known as: ADVIL Take 400 mg by mouth every 6 (six) hours as needed for headache or mild pain.   Lancet Device Misc Use as instructed to test blood sugar daily E11.65   losartan-hydrochlorothiazide 100-25 MG tablet Commonly known as: HYZAAR TAKE ONE TABLET BY MOUTH DAILY   metFORMIN 500 MG 24 hr tablet Commonly known as: GLUCOPHAGE-XR Take 1 tablet (500 mg total) by mouth daily with breakfast.   ondansetron 8 MG tablet Commonly known as: ZOFRAN Take 1 tablet (8 mg total) by mouth as directed. Take one tablet 30 minutes prior to each Temodar dose and every 8 hours as needed for nausea   OneTouch Delica Lancets 54O Misc Use as instructed to test blood sugar once daily E11.65   OneTouch Verio test strip Generic drug: glucose blood 1 each by Other route daily in the afternoon. Use as instructed to test blood sugar one time daily E11.65 What changed:  how much to take how to take this when to take this Changed  by: Dorita Sciara, MD   predniSONE 10 MG tablet Commonly known as: DELTASONE 2 tabs by mouth per day for 5 days   SANDOSTATIN IJ Inject 1 each as directed every 30 (thirty) days.   sildenafil 100 MG tablet Commonly known as: VIAGRA TAKE 1/2 TO 1 TABLET BY MOUTH DAILY AS NEEDED FOR FOR ERECTILE DYSFUNCTION   simvastatin 20 MG tablet Commonly known as: ZOCOR TAKE ONE TABLET BY MOUTH DAILY AT 6PM   temozolomide 180 MG capsule Commonly known as: TEMODAR Take 2 capsules (360 mg total) by mouth daily. Take on days 10-14 of each 28 day cycle. May take on an empty stomach or at bedtime to decrease nausea & vomiting. Start taking on: January 31, 2021 What changed:  additional instructions These instructions start on January 31, 2021. If you are unsure what to do until then, ask your doctor or other care provider. Changed by: Darl Pikes, RPH-CPP         OBJECTIVE:   Vital Signs: BP 132/88   Pulse 64   Ht 5\' 10"  (1.778 m)   Wt 168 lb (76.2 kg)   SpO2 96%   BMI 24.11 kg/m   Wt Readings from Last 3 Encounters:  01/18/21 168 lb (76.2 kg)  01/17/21 168 lb 9.6 oz (76.5 kg)  12/19/20 169 lb (76.7 kg)     Exam: General: Pt appears well and is in NAD  Lungs: Clear with good BS bilat with no rales, rhonchi, or wheezes  Heart: RRR   Abdomen: Normoactive bowel sounds, soft, nontender, without masses or organomegaly palpable  Extremities: No pretibial edema.   Neuro: MS is good with appropriate affect, pt is alert and Ox3     DM foot exam: 01/17/2020   The skin of the feet is intact without sores or ulcerations. The pedal pulses are 2+ on right and 2+ on left. The sensation is intact to a screening 5.07, 10 gram monofilament bilaterally    DATA REVIEWED:  Lab Results  Component Value Date   HGBA1C 5.7 (A) 01/18/2021   HGBA1C 6.1 10/19/2020   HGBA1C 5.5 07/20/2020   Lab Results  Component Value Date  MICROALBUR 1.9 01/17/2020   LDLCALC 73 10/19/2020    CREATININE 1.23 01/17/2021     Lab Results  Component Value Date   CHOL 136 10/19/2020   HDL 51.10 10/19/2020   LDLCALC 73 10/19/2020   LDLDIRECT 139.2 10/10/2008   TRIG 60.0 10/19/2020   CHOLHDL 3 10/19/2020       Results for Pokorny, Rodgers "ED" (MRN [id number]) as of 01/18/2020 07:31  Ref. Range 01/17/2020 11:53  Creatinine,U Latest Units: mg/dL 144.8  Microalb, Ur Latest Ref Range: 0.0 - 1.9 mg/dL 1.9  MICROALB/CREAT RATIO Latest Ref Range: 0.0 - 30.0 mg/g 1.3    ASSESSMENT / PLAN / RECOMMENDATIONS:   1) Type 2 Diabetes Mellitus, In remission , Without complications - Most recent A1c of 5.7 %. Goal A1c < 7.0 %.    - He continues to do well with lifestyle changes  - He has been off Metformin since 07/2020 . I have encouraged him to keep closer on his glucose especially the first few days after starting Chemotherapy to monitor for evidence of hyperglycemia , will consider restarting anti-glycemic agents if BG's are consistently over 180 mg/dL  - Pt expressed understanding     MEDICATIONS: N/   EDUCATION / INSTRUCTIONS: BG monitoring instructions: Patient is instructed to check his blood sugars 2- 3 times a week.     2) Diabetic complications:  Eye: Does not have known diabetic retinopathy.  Neuro/ Feet: Does not have known diabetic peripheral neuropathy .  Renal: Patient does not have known baseline CKD. Microalbuminuria is normal    F/U in 6 months   Signed electronically by: Mack Guise, MD  [facility]  [facility] [address] [place], [place] [address] Phone: [phone] FAX: [phone]   CC: [name], [place] [address] Phone: [phone]  Fax: [phone]  Return to Endocrinology clinic as below: Future Appointments  Date Time Provider [place]  02/06/2021 10:45 AM [facility] PHLEBOTOMIST [facility] None  02/06/2021 11:15 AM Owens Shark, NP [facility] None   02/14/2021 10:00 AM [facility] FLUSH ROOM [facility] None  08/01/2021 10:30 AM Jobany Montellano, Melanie Crazier, MD [facility] None  10/23/2021 10:40 AM [name], MD [facility] None

## 2021-01-18 NOTE — Telephone Encounter (Signed)
Oral Oncology Patient Advocate Encounter  I spoke with Mr Ronald Thompson this afternoon to schedule the first fills of Xeloda and Temodar.  Xeloda and Temodar will be filled through [facility] and the patient will pick up on 01/19/21.    [facility] will call 7-10 days before next refill is due to complete adherence call and set up next refills.     [place] Patient [place] Phone [phone] Fax [phone] 01/18/2021 4:25 PM

## 2021-01-18 NOTE — Telephone Encounter (Signed)
Oral Oncology Pharmacist Encounter  Received new prescription for Temodar (temozolomide) and Xeloda (capecitabine) for the treatment of metastatic pancreatic neuroendocrine tumor, planned duration until disease progression or unacceptable drug toxicity.  CMP/CBC from 01/17/21 assessed, no relevant lab abnormalities. Prescription dose and frequency assessed.   Current medication list in Epic reviewed, no relevant DDIs with capecitabine or temozolomide identified.  Evaluated chart and  no patient barriers to medication adherence identified.   Prescription has been e-scribed to the [facility] for benefits analysis and approval.  Oral Oncology Clinic will continue to follow for insurance authorization, copayment issues, initial counseling and start date.  Patient agreed to treatment on 01/17/21 per MD documentation.  Darl Pikes, PharmD, BCPS, BCOP, CPP Hematology/Oncology Clinical Pharmacist Practitioner [facility] [phone]  01/18/2021 2:06 PM

## 2021-01-18 NOTE — Progress Notes (Signed)
Received fax from [facility] that Temodar and Xeloda have been denied. Forwarded this information to [place] with Oral Oncology program.

## 2021-01-18 NOTE — Telephone Encounter (Signed)
Oral Chemotherapy Pharmacist Encounter  Patient Education I spoke with patient and his wife for overview of new oral chemotherapy medication: Temodar (temozolomide) and Xeloda (capecitabine) for the treatment of metastatic pancreatic neuroendocrine tumor, planned duration until disease progression or unacceptable drug toxicity.   Pt is doing well. Counseled patient on administration, dosing, side effects, monitoring, drug-food interactions, safe handling, storage, and disposal. Patient will take  Temodar: Take 2 capsules (360 mg total) by mouth daily. Take on days 10-14 of each 28 day cycle. May take on an empty stomach or at bedtime to decrease nausea & vomiting Xeloda: Take 3 tablets (1500 mg) in the morning and 2 tablets (1000 mg) in the evening. Take with food. Take on days 1 - 14 of each 28 day cycle. Ondansetron: Take 1 tablet (8 mg total) by mouth as directed. Take one tablet 30 minutes prior to each Temodar dose and every 8 hours as needed for nausea  Side effects include but not limited to:  Temodar: N/V, constipation, and decreased wbc  Xeloda:  hand-foot syndrome, diarrhea, fatigue, and decreased wbc  Reviewed with patient importance of keeping a medication schedule and plan for any missed doses.  After discussion with patient no patient barriers to medication adherence identified.   [facility] voiced understanding and appreciation. All questions answered. Medication handout provided.  Provided patient with [facility] phone number. Patient knows to call the office with questions or concerns. Oral Chemotherapy Navigation Clinic will continue to follow.  Darl Pikes, PharmD, BCPS, BCOP, CPP Hematology/Oncology Clinical Pharmacist Practitioner [facility] [phone]  01/18/2021 3:59 PM

## 2021-01-18 NOTE — Patient Instructions (Signed)
-   Keep Up the good Work !

## 2021-01-19 ENCOUNTER — Other Ambulatory Visit (HOSPITAL_COMMUNITY): Payer: Self-pay

## 2021-02-01 ENCOUNTER — Telehealth (INDEPENDENT_AMBULATORY_CARE_PROVIDER_SITE_OTHER): Payer: Medicare Other | Admitting: Family Medicine

## 2021-02-01 DIAGNOSIS — R059 Cough, unspecified: Secondary | ICD-10-CM | POA: Diagnosis not present

## 2021-02-01 MED ORDER — AZITHROMYCIN 250 MG PO TABS: ORAL_TABLET | ORAL | 0 refills | Status: DC

## 2021-02-01 NOTE — Progress Notes (Signed)
Virtual Visit via Telephone Note  I connected with Ronald Thompson on 02/01/21 at  3:40 PM EDT by telephone and verified that I am speaking with the correct person using two identifiers.   I discussed the limitations, risks, security and privacy concerns of performing an evaluation and management service by telephone and the availability of in person appointments. I also discussed with the patient that there may be a patient responsible charge related to this service. The patient expressed understanding and agreed to proceed.  Location patient: home, [place] Location provider: work or home office Participants present for the call: patient, provider Patient did not have a visit with me in the prior 7 days to address this/these issue(s).   History of Present Illness:  Acute telemedicine visit for cough: -Onset: 2-3 days ago -Symptoms include: cough, bad at night, hurts when coughs, reports very susceptible to resp infections as has hx lung cancer and scarring, also has neo-endocrine cancer reports is on 2 chemo drug - reports any time he get this his doctor gives him a zpack -Denies:fevers, sinus issues, CP other than when he coughs, change in baseline SOB, NVD -Pertinent past medical history:see below -Pertinent medication allergies: Allergies  Allergen Reactions   Penicillins Hives    ENTIRE BODY Has patient had a PCN reaction causing immediate rash, facial/tongue/throat swelling, SOB or lightheadedness with hypotension: No Has patient had a PCN reaction causing severe rash involving mucus membranes or skin necrosis: No Has patient had a PCN reaction that required hospitalization * *  YES  * * Has patient had a PCN reaction occurring within the last 10 years: No If all of the above answers are "NO", then may proceed with Cephalosporin use. 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 25 yrs ago-during cancer tx.     Observations/Objective: Patient sounds cheerful and well on the phone. I do not appreciate any SOB. Speech and thought processing are grossly intact. Patient reported vitals:  Assessment and Plan:  Cough  -we discussed possible serious and likely etiologies, options for evaluation and workup, limitations of telemedicine visit vs in person visit, treatment, treatment risks and precautions. Pt prefers to treat via telemedicine empirically rather than in person at this moment. Query VURI, COVID19, bronchitis, resp infection vs other. He feels very strongly that this is a bacterial resp illness and that he requires a zpack due to his prior hx.  He finally agreed to pursue covid testing and advised follow up video visit with his PCP or [facility] if positive. He insisted on an abx. Discussed risks/benefit/indications. Sent the requested medication due to underlying higher risk for bacterial resp infections and his requests.  Advised to seek prompt follow up with PCP or in person care if worsening, new  symptoms arise, or if is not improving with treatment. Advised of options for inperson care in case PCP office not available. Did let the patient know that I only do telemedicine shifts for [facility] on Tuesdays and Thursdays and advised a follow up visit with PCP or at an [facility] if has further questions or concerns.   Follow Up Instructions:  I did not refer this patient for an OV with me in the next 24 hours for this/these issue(s).  I discussed the assessment and treatment plan with the patient. The patient was provided an opportunity to ask questions and all were answered. The patient agreed with the plan and demonstrated an understanding of the instructions.   I spent 18 minutes on the date of this visit in the care of this patient. See summary of tasks completed to properly care for this patient in the detailed notes above which also included counseling of above, review of PMH, medications, allergies, evaluation of the patient and ordering and/or  instructing patient on testing and care options.     Lucretia Kern, DO

## 2021-02-01 NOTE — Patient Instructions (Signed)
  HOME CARE TIPS:  -[place] testing information: https://[email or web address].org/ OR [phone] Most pharmacies also offer testing and home test kits. If the Covid19 test is positive, please make a prompt follow up visit with your primary care office or with [facility] to discuss treatment options. Treatments for Covid19 are best given early in the course of the illness.   -I sent the medication(s) we discussed to your pharmacy: Meds ordered this encounter  Medications   azithromycin (ZITHROMAX) 250 MG tablet    Sig: 2 tabs day 1, then one tab daily    Dispense:  6 tablet    Refill:  0      -can use nasal saline a few times per day if you have nasal congestion  -stay hydrated, drink plenty of fluids and eat small healthy meals - avoid dairy  -can take 1000 IU (74mcg) Vit D3 and 100-500 mg of Vit C daily per instructions  -follow up with your doctor in 2-3 days unless improving and feeling better    It was nice to meet you today, and I really hope you are feeling better soon. I help [facility] out with telemedicine visits on Tuesdays and Thursdays and am available for visits on those days. If you have any concerns or questions following this visit please schedule a follow up visit with your Primary Care doctor or seek care at a local urgent care clinic to avoid delays in care.    Seek in person care or schedule a follow up video visit promptly if your symptoms worsen, new concerns arise or you are not improving with treatment. Call 911 and/or seek emergency care if your symptoms are severe or life threatening.

## 2021-02-06 ENCOUNTER — Encounter: Payer: Self-pay | Admitting: Nurse Practitioner

## 2021-02-06 ENCOUNTER — Other Ambulatory Visit: Payer: Self-pay

## 2021-02-06 ENCOUNTER — Inpatient Hospital Stay: Payer: Medicare Other | Attending: Oncology

## 2021-02-06 ENCOUNTER — Inpatient Hospital Stay (HOSPITAL_BASED_OUTPATIENT_CLINIC_OR_DEPARTMENT_OTHER): Payer: Medicare Other | Admitting: Nurse Practitioner

## 2021-02-06 VITALS — BP 111/71 | HR 73 | Temp 97.8°F | Resp 20 | Ht 70.0 in | Wt 168.6 lb

## 2021-02-06 DIAGNOSIS — D3A8 Other benign neuroendocrine tumors: Secondary | ICD-10-CM

## 2021-02-06 DIAGNOSIS — C7B8 Other secondary neuroendocrine tumors: Secondary | ICD-10-CM | POA: Insufficient documentation

## 2021-02-06 DIAGNOSIS — C7A8 Other malignant neuroendocrine tumors: Secondary | ICD-10-CM | POA: Diagnosis not present

## 2021-02-06 DIAGNOSIS — Z20822 Contact with and (suspected) exposure to covid-19: Secondary | ICD-10-CM | POA: Diagnosis not present

## 2021-02-06 LAB — CBC WITH DIFFERENTIAL (CANCER CENTER ONLY)
Abs Immature Granulocytes: 0.01 10*3/uL (ref 0.00–0.07)
Basophils Absolute: 0 10*3/uL (ref 0.0–0.1)
Basophils Relative: 0 %
Eosinophils Absolute: 0.1 10*3/uL (ref 0.0–0.5)
Eosinophils Relative: 2 %
HCT: 37.7 % — ABNORMAL LOW (ref 39.0–52.0)
Hemoglobin: 12.8 g/dL — ABNORMAL LOW (ref 13.0–17.0)
Immature Granulocytes: 0 %
Lymphocytes Relative: 12 %
Lymphs Abs: 0.4 10*3/uL — ABNORMAL LOW (ref 0.7–4.0)
MCH: 33.2 pg (ref 26.0–34.0)
MCHC: 34 g/dL (ref 30.0–36.0)
MCV: 97.9 fL (ref 80.0–100.0)
Monocytes Absolute: 0.4 10*3/uL (ref 0.1–1.0)
Monocytes Relative: 12 %
Neutro Abs: 2.4 10*3/uL (ref 1.7–7.7)
Neutrophils Relative %: 74 %
Platelet Count: 124 10*3/uL — ABNORMAL LOW (ref 150–400)
RBC: 3.85 MIL/uL — ABNORMAL LOW (ref 4.22–5.81)
RDW: 12.9 % (ref 11.5–15.5)
WBC Count: 3.3 10*3/uL — ABNORMAL LOW (ref 4.0–10.5)
nRBC: 0 % (ref 0.0–0.2)

## 2021-02-06 LAB — CMP (CANCER CENTER ONLY)
ALT: 26 U/L (ref 0–44)
AST: 37 U/L (ref 15–41)
Albumin: 4.1 g/dL (ref 3.5–5.0)
Alkaline Phosphatase: 64 U/L (ref 38–126)
Anion gap: 8 (ref 5–15)
BUN: 35 mg/dL — ABNORMAL HIGH (ref 8–23)
CO2: 32 mmol/L (ref 22–32)
Calcium: 9.6 mg/dL (ref 8.9–10.3)
Chloride: 97 mmol/L — ABNORMAL LOW (ref 98–111)
Creatinine: 1.33 mg/dL — ABNORMAL HIGH (ref 0.61–1.24)
GFR, Estimated: 59 mL/min — ABNORMAL LOW (ref >60)
Glucose, Bld: 155 mg/dL — ABNORMAL HIGH (ref 70–99)
Potassium: 4.2 mmol/L (ref 3.5–5.1)
Sodium: 137 mmol/L (ref 135–145)
Total Bilirubin: 0.9 mg/dL (ref 0.3–1.2)
Total Protein: 6.8 g/dL (ref 6.5–8.1)

## 2021-02-06 NOTE — Progress Notes (Signed)
[facility] OFFICE PROGRESS NOTE   Diagnosis: Pancreas neuroendocrine tumor  INTERVAL HISTORY:   Mr. Gras returns as scheduled.  He began cycle 1 Xeloda/temozolomide 01/22/2021.  Around day 10 he developed fatigue, body aches, mild nausea and dizziness.  No mouth sores.  Some constipation.  No hand or foot pain or redness.  He had a video visit with Dr. Maudie Mercury on 02/01/2021 for onset of a cough 2 to 3 days prior.  He was prescribed a Z-Pak.  He reports initial improvement in the cough.  The cough recurred last night.  He describes the cough as "deep" and nonproductive.  Some wheezing.  He denies shortness of breath.  No fever.  No nasal congestion or sinus drainage.  He has not completed a COVID test.  Objective:  Vital signs in last 24 hours:  Blood pressure 111/71, pulse 73, temperature 97.8 F (36.6 C), temperature source Oral, resp. rate 20, height 5\' 10"  (1.778 m), weight 168 lb 9.6 oz (76.5 kg), SpO2 98 %.    HEENT: No thrush or ulcers. Resp: Lungs clear bilaterally. Cardio: Regular rate and rhythm. GI: Fullness right upper abdomen, no definite hepatomegaly. Vascular: No leg edema. Skin: Palms without erythema.   Lab Results:  Lab Results  Component Value Date   WBC 3.3 (L) 02/06/2021   HGB 12.8 (L) 02/06/2021   HCT 37.7 (L) 02/06/2021   MCV 97.9 02/06/2021   PLT 124 (L) 02/06/2021   NEUTROABS 2.4 02/06/2021    Imaging:  No results found.  Medications: I have reviewed the patient's current medications.  Assessment/Plan: Pancreatic neuroendocrine tumor, WHO grade 2, pancreatic head mass and small peripancreatic/celiac nodes on a CT 02/04/2014 EUS revealed evidence of multiple liver metastases-status post an FNA biopsy of a left liver lesion 02/10/2014 confirming a neuroendocrine tumor   Octreotide scan 04/01/2014 with multiple foci of metastatic neuroendocrine tumor in the liver Monthly Sandostatin started 03/16/2014 Restaging CT 07/04/2014 with  resolution of a previously noted pancreas head lesion and probable progression of liver metastases Restaging CT 10/31/2014-stable liver lesions, no evidence of a pancreas mass, stable. Restaging CT 02/28/2015-stable hepatic metastases, stable pancreas lesion unchanged Restaging CT 08/30/2015-stable pancreas mass, slight enlargement of hepatic metastases Monthly Sandostatin continued Restaging CTs 01/17/2016 revealed enlargement of liver lesions, no new lesions, enlargement of the pancreas head mass Gallium DOTATATE scan 10/01/2016 confirmed uptake in the liver metastases, pancreas primary, peripancreatic adenopathy, and left iliac bone Cycle 1 Lutathera 03/05/2017 Cycle 2 Lutathera 04/30/2017 Cycle 3 Lutathera 07/02/2017 Cycle 4 Lutathera 08/27/2017 Netspot scan 09/29/2017-decreased radiotracer activity within the pancreas head, peripancreatic lymph node, and solitary skeletal lesion.  Liver lesions have increased in size with a decrease in radiotracer activity Monthly Sandostatin continued Netspot scan 05/20/2018-stable to decreased size of liver lesions, most have decreased SUV, similar uptake in the pancreas lesion, previously noted peripancreatic lymph node has resolved him a mild decrease in uptake associated with a left iliac metastasis, no evidence of disease progression Monthly Sandostatin continued Netspot scan 01/29/2019- overall stable to improved, 1 lesion left hepatic lobe has increased tracer activity-unchanged in size, majority of hepatic lesions have reduced activity compared to the pretreatment scan, decreased activity left iliac bone lesion, stable activity in the head of the pancreas lesion, no new lesions Monthly Sandostatin continued Netspot on September 24, 2019-increase in size of 3 liver lesions with associated stable radiotracer activity, no new lesions, stable small left iliac lesion Cycle 1 salvage Lutathera 10/27/2019 Cycle 2 salvage Lutathera 12/22/2019 Netspot on  02/15/2020-decrease in radiotracer activity in the liver metastases with a decrease in size of several lesions, no new lesions, no progressive disease in the bowel or mesentery, decreased activity in the left iliac bone lesion Monthly Sandostatin continued  Netspot 10/13/2020-decrease in radiotracer activity in the left and right liver, decreased size of liver lesions, one lesion in the superior right liver has increased in size and has no radiotracer activity, no evidence of metastatic disease outside the liver PET 11/15/2020- moderate hypermetabolic activity associated with several enlarging liver lesions that had limited activity on the dotatate PET, a lesion in the left lateral hepatic lobe with mild persistent dotatate activity has mild peripheral FDG activity and is decreased in size from a dotatate PET 1 year ago.  No hypermetabolic activity in the pancreas or bowel.  No hypermetabolic activity in the previous dotatate avid left iliac lesion Monthly Sandostatin continued Cycle 1 capecitabine/temozolomide 01/22/2021 (capecitabine twice daily days 1 through 14 of each 28-day cycle/temozolomide days 10 through 14)    Chest Seminoma in 1990 treated with BEP and chest [facility] at [facility] 3. 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 chronic exertional dyspnea following treatment for the seminoma   5. aortic stenosis on an echocardiogram December 2011, severe aortic stenosis on echocardiogram 12/06/2015, status post TAVR on 04/16/2016 6. lumbar disc surgery 2014   7. acute abdominal pain 02/04/2014-resolved   8.  Diabetes  Disposition: Mr. Siegmann appears stable.  He began cycle 1 capecitabine/temozolomide beginning 01/22/2021.  Overall he tolerated well.  We reviewed the CBC from today.  Blood counts overall stable.  He reports a continued cough despite  recent completion of a Z-Pak.  We recommend he complete a COVID test.  He agrees and will contact the office with the result.  He will follow-up with his PCP if the cough persists or he develops new symptoms.  He will return for lab, follow-up, Sandostatin on 02/19/2021.  He will contact the office in the interim as outlined above or with any other problems.  Patient seen with Dr. [name].    Ned Card ANP/GNP-BC   02/06/2021  11:54 AM  This was a shared visit with Ned Card.  Mr. Bauernfeind has completed 1 cycle of capecitabine/temozolomide.  He tolerated the chemotherapy well.  We recommended he obtain a COVID test and follow-up with his primary provider for persistent symptoms of an upper respiratory infection.  I was present for greater than 50% of today's visit.  I performed medical decision making.  [name], MD

## 2021-02-07 ENCOUNTER — Other Ambulatory Visit (HOSPITAL_COMMUNITY): Payer: Self-pay

## 2021-02-07 ENCOUNTER — Other Ambulatory Visit: Payer: Self-pay | Admitting: *Deleted

## 2021-02-07 DIAGNOSIS — D3A8 Other benign neuroendocrine tumors: Secondary | ICD-10-CM

## 2021-02-07 MED ORDER — CAPECITABINE 500 MG PO TABS
ORAL_TABLET | ORAL | 0 refills | Status: DC
Start: 2021-02-09 — End: 2021-02-07
  Filled 2021-02-07: qty 70, fill #0

## 2021-02-07 MED ORDER — CAPECITABINE 500 MG PO TABS
ORAL_TABLET | ORAL | 0 refills | Status: DC
  Filled 2021-02-07: qty 70, fill #0
  Filled 2021-02-12: qty 70, 28d supply, fill #0

## 2021-02-07 MED ORDER — TEMOZOLOMIDE 180 MG PO CAPS
360.0000 mg | ORAL_CAPSULE | Freq: Every day | ORAL | 0 refills | Status: DC
  Filled 2021-02-07 – 2021-02-12 (×2): qty 10, 5d supply, fill #0

## 2021-02-08 ENCOUNTER — Other Ambulatory Visit: Payer: Self-pay | Admitting: Nurse Practitioner

## 2021-02-08 ENCOUNTER — Telehealth: Payer: Self-pay | Admitting: Nurse Practitioner

## 2021-02-08 ENCOUNTER — Telehealth: Payer: Self-pay | Admitting: Internal Medicine

## 2021-02-08 NOTE — Telephone Encounter (Signed)
   Patient called back and said that he tested positive for Covid 19 this morning. Please advise

## 2021-02-08 NOTE — Telephone Encounter (Signed)
Patient calling to report he is still coughing. He had a virtual visit 6/30 with Dr Maudie Mercury. Patient requesting another round of azithromycin (ZITHROMAX) 250 MG tablet   [facility] [phone] - [place], [place]

## 2021-02-08 NOTE — Telephone Encounter (Signed)
I called Mr. Quinlivan to follow-up on the cough he was experiencing at the time of his visit earlier this week.  He reports the cough has persisted.  He performed a COVID test this morning and it returned positive.  I reviewed the symptom onset with Wilber Bihari, nurse practitioner.  She does not feel he is a candidate for paxlovid or antibody infusion due to symptom onset approximately 6/28.  I reviewed the CDC recommendations for isolation with Mr. Eckmann.  He has contacted his PCP regarding the persistent cough and positive COVID test, currently awaiting a return call.  He understands to seek emergency evaluation if current symptoms worsen or he develops shortness of breath.

## 2021-02-09 ENCOUNTER — Encounter: Payer: Self-pay | Admitting: Internal Medicine

## 2021-02-09 NOTE — Telephone Encounter (Signed)
Unfortunately in controlled randomized trials, the zpack has not been found to be helpful in COVID infection, so this would not be needed, thanks

## 2021-02-09 NOTE — Telephone Encounter (Signed)
Since covid is the cause of the bronchitis, this is not needed.  It would be extremely unlikely he would have a bacterial bronchitis at the exact same time he has a viral bronchitis, so I would have to decline this request

## 2021-02-09 NOTE — Telephone Encounter (Signed)
Sent pt a mychart message. 

## 2021-02-09 NOTE — Telephone Encounter (Signed)
Follow up message  Patient requesting call to discuss additional antibiotic

## 2021-02-09 NOTE — Telephone Encounter (Signed)
Called and spoke with pt about providers recommendations, pt states that he dose not want antibiotic for COVID, but for bronchitis. Pt states he dose not have any COVID symptoms, just a cough.

## 2021-02-11 DIAGNOSIS — Z20822 Contact with and (suspected) exposure to covid-19: Secondary | ICD-10-CM | POA: Diagnosis not present

## 2021-02-12 ENCOUNTER — Other Ambulatory Visit (HOSPITAL_COMMUNITY): Payer: Self-pay

## 2021-02-14 ENCOUNTER — Inpatient Hospital Stay: Payer: Medicare Other

## 2021-02-19 ENCOUNTER — Inpatient Hospital Stay: Payer: Medicare Other

## 2021-02-19 ENCOUNTER — Other Ambulatory Visit: Payer: Self-pay

## 2021-02-19 ENCOUNTER — Inpatient Hospital Stay (HOSPITAL_BASED_OUTPATIENT_CLINIC_OR_DEPARTMENT_OTHER): Payer: Medicare Other | Admitting: Oncology

## 2021-02-19 VITALS — BP 116/74 | HR 68 | Temp 97.2°F | Resp 18 | Wt 164.0 lb

## 2021-02-19 DIAGNOSIS — D3A8 Other benign neuroendocrine tumors: Secondary | ICD-10-CM

## 2021-02-19 DIAGNOSIS — C7A8 Other malignant neuroendocrine tumors: Secondary | ICD-10-CM | POA: Diagnosis not present

## 2021-02-19 DIAGNOSIS — C7B8 Other secondary neuroendocrine tumors: Secondary | ICD-10-CM | POA: Diagnosis not present

## 2021-02-19 LAB — CMP (CANCER CENTER ONLY)
ALT: 18 U/L (ref 0–44)
AST: 24 U/L (ref 15–41)
Albumin: 4.4 g/dL (ref 3.5–5.0)
Alkaline Phosphatase: 58 U/L (ref 38–126)
Anion gap: 8 (ref 5–15)
BUN: 36 mg/dL — ABNORMAL HIGH (ref 8–23)
CO2: 29 mmol/L (ref 22–32)
Calcium: 9.4 mg/dL (ref 8.9–10.3)
Chloride: 102 mmol/L (ref 98–111)
Creatinine: 1.41 mg/dL — ABNORMAL HIGH (ref 0.61–1.24)
GFR, Estimated: 55 mL/min — ABNORMAL LOW (ref >60)
Glucose, Bld: 92 mg/dL (ref 70–99)
Potassium: 4.2 mmol/L (ref 3.5–5.1)
Sodium: 139 mmol/L (ref 135–145)
Total Bilirubin: 1.3 mg/dL — ABNORMAL HIGH (ref 0.3–1.2)
Total Protein: 6.7 g/dL (ref 6.5–8.1)

## 2021-02-19 LAB — CBC WITH DIFFERENTIAL (CANCER CENTER ONLY)
Abs Immature Granulocytes: 0.01 10*3/uL (ref 0.00–0.07)
Basophils Absolute: 0 10*3/uL (ref 0.0–0.1)
Basophils Relative: 1 %
Eosinophils Absolute: 0.1 10*3/uL (ref 0.0–0.5)
Eosinophils Relative: 2 %
HCT: 38.9 % — ABNORMAL LOW (ref 39.0–52.0)
Hemoglobin: 13.3 g/dL (ref 13.0–17.0)
Immature Granulocytes: 0 %
Lymphocytes Relative: 11 %
Lymphs Abs: 0.5 10*3/uL — ABNORMAL LOW (ref 0.7–4.0)
MCH: 33.8 pg (ref 26.0–34.0)
MCHC: 34.2 g/dL (ref 30.0–36.0)
MCV: 99 fL (ref 80.0–100.0)
Monocytes Absolute: 0.6 10*3/uL (ref 0.1–1.0)
Monocytes Relative: 14 %
Neutro Abs: 3 10*3/uL (ref 1.7–7.7)
Neutrophils Relative %: 72 %
Platelet Count: 114 10*3/uL — ABNORMAL LOW (ref 150–400)
RBC: 3.93 MIL/uL — ABNORMAL LOW (ref 4.22–5.81)
RDW: 14 % (ref 11.5–15.5)
WBC Count: 4.2 10*3/uL (ref 4.0–10.5)
nRBC: 0 % (ref 0.0–0.2)

## 2021-02-19 MED ORDER — OCTREOTIDE ACETATE 30 MG IM KIT
30.0000 mg | PACK | Freq: Once | INTRAMUSCULAR | Status: AC
Start: 2021-02-19 — End: 2021-02-19
  Administered 2021-02-19: 30 mg via INTRAMUSCULAR

## 2021-02-19 NOTE — Progress Notes (Signed)
[facility] OFFICE PROGRESS NOTE   Diagnosis: Neuroendocrine tumor  INTERVAL HISTORY:   Ronald Thompson returns as scheduled.  The "bronchitis "symptoms have resolved.  He tested positive for COVID-19 on 02/08/2021.  He did not receive specific therapy.  He reports mild nausea following temozolomide.  He has altered taste following chemotherapy.  He does not have significant diarrhea or hand/foot pain.  Objective:  Vital signs in last 24 hours:  Blood pressure 116/74, pulse 68, temperature (!) 97.2 F (36.2 C), temperature source Tympanic, resp. rate 18, weight 164 lb (74.4 kg), SpO2 98 %.    HEENT: No thrush or ulcers Resp: Lungs clear bilaterally, no respiratory distress Cardio: Regular rate and rhythm GI: No hepatosplenomegaly Vascular: No leg edema  Skin: Palms without erythema   Lab Results:  Lab Results  Component Value Date   WBC 4.2 02/19/2021   HGB 13.3 02/19/2021   HCT 38.9 (L) 02/19/2021   MCV 99.0 02/19/2021   PLT 114 (L) 02/19/2021   NEUTROABS 3.0 02/19/2021    CMP  Lab Results  Component Value Date   NA 137 02/06/2021   K 4.2 02/06/2021   CL 97 (L) 02/06/2021   CO2 32 02/06/2021   GLUCOSE 155 (H) 02/06/2021   BUN 35 (H) 02/06/2021   CREATININE 1.33 (H) 02/06/2021   CALCIUM 9.6 02/06/2021   PROT 6.8 02/06/2021   ALBUMIN 4.1 02/06/2021   AST 37 02/06/2021   ALT 26 02/06/2021   ALKPHOS 64 02/06/2021   BILITOT 0.9 02/06/2021   GFRNONAA 59 (L) 02/06/2021   GFRAA >60 01/19/2020     Medications: I have reviewed the patient's current medications.   Assessment/Plan: Pancreatic neuroendocrine tumor, WHO grade 2, pancreatic head mass and small peripancreatic/celiac nodes on a CT 02/04/2014 EUS revealed evidence of multiple liver metastases-status post an FNA biopsy of a left liver lesion 02/10/2014 confirming a neuroendocrine tumor   Octreotide scan 04/01/2014 with multiple foci of metastatic neuroendocrine tumor in the liver Monthly  Sandostatin started 03/16/2014 Restaging CT 07/04/2014 with resolution of a previously noted pancreas head lesion and probable progression of liver metastases Restaging CT 10/31/2014-stable liver lesions, no evidence of a pancreas mass, stable. Restaging CT 02/28/2015-stable hepatic metastases, stable pancreas lesion unchanged Restaging CT 08/30/2015-stable pancreas mass, slight enlargement of hepatic metastases Monthly Sandostatin continued Restaging CTs 01/17/2016 revealed enlargement of liver lesions, no new lesions, enlargement of the pancreas head mass Gallium DOTATATE scan 10/01/2016 confirmed uptake in the liver metastases, pancreas primary, peripancreatic adenopathy, and left iliac bone Cycle 1 Lutathera 03/05/2017 Cycle 2 Lutathera 04/30/2017 Cycle 3 Lutathera 07/02/2017 Cycle 4 Lutathera 08/27/2017 Netspot scan 09/29/2017-decreased radiotracer activity within the pancreas head, peripancreatic lymph node, and solitary skeletal lesion.  Liver lesions have increased in size with a decrease in radiotracer activity Monthly Sandostatin continued Netspot scan 05/20/2018-stable to decreased size of liver lesions, most have decreased SUV, similar uptake in the pancreas lesion, previously noted peripancreatic lymph node has resolved him a mild decrease in uptake associated with a left iliac metastasis, no evidence of disease progression Monthly Sandostatin continued Netspot scan 01/29/2019- overall stable to improved, 1 lesion left hepatic lobe has increased tracer activity-unchanged in size, majority of hepatic lesions have reduced activity compared to the pretreatment scan, decreased activity left iliac bone lesion, stable activity in the head of the pancreas lesion, no new lesions Monthly Sandostatin continued Netspot on September 24, 2019-increase in size of 3 liver lesions with associated stable radiotracer activity, no new lesions, stable small left  iliac lesion Cycle 1 salvage Lutathera  10/27/2019 Cycle 2 salvage Lutathera 12/22/2019 Netspot on 02/15/2020-decrease in radiotracer activity in the liver metastases with a decrease in size of several lesions, no new lesions, no progressive disease in the bowel or mesentery, decreased activity in the left iliac bone lesion Monthly Sandostatin continued  Netspot 10/13/2020-decrease in radiotracer activity in the left and right liver, decreased size of liver lesions, one lesion in the superior right liver has increased in size and has no radiotracer activity, no evidence of metastatic disease outside the liver PET 11/15/2020- moderate hypermetabolic activity associated with several enlarging liver lesions that had limited activity on the dotatate PET, a lesion in the left lateral hepatic lobe with mild persistent dotatate activity has mild peripheral FDG activity and is decreased in size from a dotatate PET 1 year ago.  No hypermetabolic activity in the pancreas or bowel.  No hypermetabolic activity in the previous dotatate avid left iliac lesion Monthly Sandostatin continued Cycle 1 capecitabine/temozolomide 01/22/2021 (capecitabine twice daily days 1 through 14 of each 28-day cycle/temozolomide days 10 through 14) Cycle 2 capecitabine/temozolomide 02/19/2021    Chest Seminoma in 1990 treated with BEP and chest radiation at [facility] 3. 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 chronic exertional dyspnea following treatment for the seminoma   5. aortic stenosis on an echocardiogram December 2011, severe aortic stenosis on echocardiogram 12/06/2015, status post TAVR on 04/16/2016 6. lumbar disc surgery 2014   7. acute abdominal pain 02/04/2014-resolved   8.  Diabetes 9.  COVID-19 infection July 2022    Disposition: Ronald Thompson appears stable.  He has recovered from the recent COVID-19 infection.  He  will begin cycle 2 capecitabine/temozolomide today.  He will return for a CBC in 2 weeks and an office visit in 4 weeks.  We will plan for repeat imaging after 3-4 cycles of capecitabine/temozolomide.  We will repeat the chromogranin a level when he returns next month.    Betsy Coder, MD  02/19/2021  10:58 AM

## 2021-02-19 NOTE — Patient Instructions (Signed)
Octreotide injection solution What is this medication? OCTREOTIDE (ok TREE oh tide) is used to reduce blood levels of growth hormone in patients with a condition called acromegaly. This medicine also reduces flushing and watery diarrhea caused by certain types of cancer. This medicine may be used for other purposes; ask your health care provider or pharmacist if you have questions. COMMON BRAND NAME(S): Bynfezia, Sandostatin What should I tell my care team before I take this medication? They need to know if you have any of these conditions: diabetes gallbladder disease kidney disease liver disease thyroid disease an unusual or allergic reaction to octreotide, other medicines, foods, dyes, or preservatives pregnant or trying to get pregnant breast-feeding How should I use this medication? This medicine is for injection under the skin or into a vein (only in emergency situations). It is usually given by a health care professional in a hospital or clinic setting. If you get this medicine at home, you will be taught how to prepare and give this medicine. Allow the injection solution to come to room temperature before use. Do not warm it artificially. Use exactly as directed. Take your medicine at regular intervals. Do not take your medicine more often than directed. It is important that you put your used needles and syringes in a special sharps container. Do not put them in a trash can. If you do not have a sharps container, call your pharmacist or healthcare provider to get one. Talk to your pediatrician regarding the use of this medicine in children. Special care may be needed. Overdosage: If you think you have taken too much of this medicine contact a poison control center or emergency room at once. NOTE: This medicine is only for you. Do not share this medicine with others. What if I miss a dose? If you miss a dose, take it as soon as you can. If it is almost time for your next dose, take only  that dose. Do not take double or extra doses. What may interact with this medication? bromocriptine certain medicines for blood pressure, heart disease, irregular heartbeat cyclosporine diuretics medicines for diabetes, including insulin quinidine This list may not describe all possible interactions. Give your health care provider a list of all the medicines, herbs, non-prescription drugs, or dietary supplements you use. Also tell them if you smoke, drink alcohol, or use illegal drugs. Some items may interact with your medicine. What should I watch for while using this medication? Visit your doctor or health care professional for regular checks on your progress. To help reduce irritation at the injection site, use a different site for each injection and make sure the solution is at room temperature before use. This medicine may cause decreases in blood sugar. Signs of low blood sugar include chills, cool, pale skin or cold sweats, drowsiness, extreme hunger, fast heartbeat, headache, nausea, nervousness or anxiety, shakiness, trembling, unsteadiness, tiredness, or weakness. Contact your doctor or health care professional right away if you experience any of these symptoms. This medicine may increase blood sugar. Ask your healthcare provider if changes in diet or medicines are needed if you have diabetes. This medicine may cause a decrease in vitamin B12. You should make sure that you get enough vitamin B12 while you are taking this medicine. Discuss the foods you eat and the vitamins you take with your health care professional. What side effects may I notice from receiving this medication? Side effects that you should report to your doctor or health care professional as soon as   possible: allergic reactions like skin rash, itching or hives, swelling of the face, lips, or tongue fast, slow, or irregular heartbeat right upper belly pain severe stomach pain signs and symptoms of high blood sugar such  as being more thirsty or hungry or having to urinate more than normal. You may also feel very tired or have blurry vision. signs and symptoms of low blood sugar such as feeling anxious; confusion; dizziness; increased hunger; unusually weak or tired; increased sweating; shakiness; cold, clammy skin; irritable; headache; blurred vision; fast heartbeat; loss of consciousness unusually weak or tired Side effects that usually do not require medical attention (report to your doctor or health care professional if they continue or are bothersome): diarrhea dizziness gas headache nausea, vomiting pain, redness, or irritation at site where injected upset stomach This list may not describe all possible side effects. Call your doctor for medical advice about side effects. You may report side effects to FDA at 1-800-FDA-1088. Where should I keep my medication? Keep out of the reach of children. Store in a refrigerator between 2 and 8 degrees C (36 and 46 degrees F). Protect from light. Allow to come to room temperature naturally. Do not use artificial heat. If protected from light, the injection may be stored at room temperature between 20 and 30 degrees C (70 and 86 degrees F) for 14 days. After the initial use, throw away any unused portion of a multiple dose vial after 14 days. Throw away unused portions of the ampules after use. NOTE: This sheet is a summary. It may not cover all possible information. If you have questions about this medicine, talk to your doctor, pharmacist, or health care provider.  2022 Elsevier/Gold Standard (2019-02-18 13:33:09)  

## 2021-03-05 ENCOUNTER — Inpatient Hospital Stay: Payer: Medicare Other | Attending: Oncology

## 2021-03-05 ENCOUNTER — Other Ambulatory Visit: Payer: Self-pay

## 2021-03-05 DIAGNOSIS — C7A8 Other malignant neuroendocrine tumors: Secondary | ICD-10-CM | POA: Insufficient documentation

## 2021-03-05 DIAGNOSIS — C7B8 Other secondary neuroendocrine tumors: Secondary | ICD-10-CM | POA: Diagnosis not present

## 2021-03-05 DIAGNOSIS — D3A8 Other benign neuroendocrine tumors: Secondary | ICD-10-CM

## 2021-03-05 LAB — CBC WITH DIFFERENTIAL (CANCER CENTER ONLY)
Abs Immature Granulocytes: 0.01 10*3/uL (ref 0.00–0.07)
Basophils Absolute: 0 10*3/uL (ref 0.0–0.1)
Basophils Relative: 0 %
Eosinophils Absolute: 0.1 10*3/uL (ref 0.0–0.5)
Eosinophils Relative: 2 %
HCT: 37.4 % — ABNORMAL LOW (ref 39.0–52.0)
Hemoglobin: 12.8 g/dL — ABNORMAL LOW (ref 13.0–17.0)
Immature Granulocytes: 0 %
Lymphocytes Relative: 9 %
Lymphs Abs: 0.4 10*3/uL — ABNORMAL LOW (ref 0.7–4.0)
MCH: 34.4 pg — ABNORMAL HIGH (ref 26.0–34.0)
MCHC: 34.2 g/dL (ref 30.0–36.0)
MCV: 100.5 fL — ABNORMAL HIGH (ref 80.0–100.0)
Monocytes Absolute: 0.5 10*3/uL (ref 0.1–1.0)
Monocytes Relative: 12 %
Neutro Abs: 3.3 10*3/uL (ref 1.7–7.7)
Neutrophils Relative %: 77 %
Platelet Count: 109 10*3/uL — ABNORMAL LOW (ref 150–400)
RBC: 3.72 MIL/uL — ABNORMAL LOW (ref 4.22–5.81)
RDW: 14.6 % (ref 11.5–15.5)
WBC Count: 4.2 10*3/uL (ref 4.0–10.5)
nRBC: 0 % (ref 0.0–0.2)

## 2021-03-13 ENCOUNTER — Other Ambulatory Visit: Payer: Self-pay | Admitting: Oncology

## 2021-03-13 ENCOUNTER — Other Ambulatory Visit (HOSPITAL_COMMUNITY): Payer: Self-pay

## 2021-03-13 DIAGNOSIS — D3A8 Other benign neuroendocrine tumors: Secondary | ICD-10-CM

## 2021-03-14 ENCOUNTER — Other Ambulatory Visit (HOSPITAL_COMMUNITY): Payer: Self-pay

## 2021-03-14 MED ORDER — CAPECITABINE 500 MG PO TABS
ORAL_TABLET | ORAL | 0 refills | Status: DC
  Filled 2021-03-19: qty 70, 28d supply, fill #0

## 2021-03-14 MED ORDER — TEMOZOLOMIDE 180 MG PO CAPS
360.0000 mg | ORAL_CAPSULE | Freq: Every day | ORAL | 0 refills | Status: DC
  Filled 2021-03-14: qty 10, 28d supply, fill #0

## 2021-03-16 ENCOUNTER — Other Ambulatory Visit (HOSPITAL_COMMUNITY): Payer: Self-pay

## 2021-03-19 ENCOUNTER — Inpatient Hospital Stay: Payer: Medicare Other

## 2021-03-19 ENCOUNTER — Other Ambulatory Visit: Payer: Self-pay

## 2021-03-19 ENCOUNTER — Other Ambulatory Visit (HOSPITAL_COMMUNITY): Payer: Self-pay

## 2021-03-19 ENCOUNTER — Inpatient Hospital Stay (HOSPITAL_BASED_OUTPATIENT_CLINIC_OR_DEPARTMENT_OTHER): Payer: Medicare Other | Admitting: Oncology

## 2021-03-19 VITALS — BP 123/68 | HR 82 | Temp 97.8°F | Resp 20 | Ht 70.0 in | Wt 166.6 lb

## 2021-03-19 DIAGNOSIS — C7A8 Other malignant neuroendocrine tumors: Secondary | ICD-10-CM | POA: Diagnosis not present

## 2021-03-19 DIAGNOSIS — C7B8 Other secondary neuroendocrine tumors: Secondary | ICD-10-CM | POA: Diagnosis not present

## 2021-03-19 DIAGNOSIS — D3A8 Other benign neuroendocrine tumors: Secondary | ICD-10-CM

## 2021-03-19 LAB — CBC WITH DIFFERENTIAL (CANCER CENTER ONLY)
Abs Immature Granulocytes: 0.01 10*3/uL (ref 0.00–0.07)
Basophils Absolute: 0 10*3/uL (ref 0.0–0.1)
Basophils Relative: 1 %
Eosinophils Absolute: 0.1 10*3/uL (ref 0.0–0.5)
Eosinophils Relative: 2 %
HCT: 37.7 % — ABNORMAL LOW (ref 39.0–52.0)
Hemoglobin: 13.3 g/dL (ref 13.0–17.0)
Immature Granulocytes: 0 %
Lymphocytes Relative: 8 %
Lymphs Abs: 0.4 10*3/uL — ABNORMAL LOW (ref 0.7–4.0)
MCH: 35.4 pg — ABNORMAL HIGH (ref 26.0–34.0)
MCHC: 35.3 g/dL (ref 30.0–36.0)
MCV: 100.3 fL — ABNORMAL HIGH (ref 80.0–100.0)
Monocytes Absolute: 0.6 10*3/uL (ref 0.1–1.0)
Monocytes Relative: 14 %
Neutro Abs: 3.3 10*3/uL (ref 1.7–7.7)
Neutrophils Relative %: 75 %
Platelet Count: 119 10*3/uL — ABNORMAL LOW (ref 150–400)
RBC: 3.76 MIL/uL — ABNORMAL LOW (ref 4.22–5.81)
RDW: 15.9 % — ABNORMAL HIGH (ref 11.5–15.5)
WBC Count: 4.3 10*3/uL (ref 4.0–10.5)
nRBC: 0 % (ref 0.0–0.2)

## 2021-03-19 LAB — CMP (CANCER CENTER ONLY)
ALT: 15 U/L (ref 0–44)
AST: 26 U/L (ref 15–41)
Albumin: 4.3 g/dL (ref 3.5–5.0)
Alkaline Phosphatase: 52 U/L (ref 38–126)
Anion gap: 8 (ref 5–15)
BUN: 29 mg/dL — ABNORMAL HIGH (ref 8–23)
CO2: 29 mmol/L (ref 22–32)
Calcium: 9.5 mg/dL (ref 8.9–10.3)
Chloride: 101 mmol/L (ref 98–111)
Creatinine: 1.18 mg/dL (ref 0.61–1.24)
GFR, Estimated: >60 mL/min (ref >60)
Glucose, Bld: 128 mg/dL — ABNORMAL HIGH (ref 70–99)
Potassium: 4.1 mmol/L (ref 3.5–5.1)
Sodium: 138 mmol/L (ref 135–145)
Total Bilirubin: 1.4 mg/dL — ABNORMAL HIGH (ref 0.3–1.2)
Total Protein: 6.6 g/dL (ref 6.5–8.1)

## 2021-03-19 MED ORDER — OCTREOTIDE ACETATE 30 MG IM KIT
30.0000 mg | PACK | Freq: Once | INTRAMUSCULAR | Status: AC
  Administered 2021-03-19: 30 mg via INTRAMUSCULAR

## 2021-03-19 NOTE — Patient Instructions (Signed)
Octreotide injection solution What is this medication? OCTREOTIDE (ok TREE oh tide) is used to reduce blood levels of growth hormone in patients with a condition called acromegaly. This medicine also reduces flushing and watery diarrhea caused by certain types of cancer. This medicine may be used for other purposes; ask your health care provider or pharmacist if you have questions. COMMON BRAND NAME(S): Bynfezia, Sandostatin What should I tell my care team before I take this medication? They need to know if you have any of these conditions: diabetes gallbladder disease kidney disease liver disease thyroid disease an unusual or allergic reaction to octreotide, other medicines, foods, dyes, or preservatives pregnant or trying to get pregnant breast-feeding How should I use this medication? This medicine is for injection under the skin or into a vein (only in emergency situations). It is usually given by a health care professional in a hospital or clinic setting. If you get this medicine at home, you will be taught how to prepare and give this medicine. Allow the injection solution to come to room temperature before use. Do not warm it artificially. Use exactly as directed. Take your medicine at regular intervals. Do not take your medicine more often than directed. It is important that you put your used needles and syringes in a special sharps container. Do not put them in a trash can. If you do not have a sharps container, call your pharmacist or healthcare provider to get one. Talk to your pediatrician regarding the use of this medicine in children. Special care may be needed. Overdosage: If you think you have taken too much of this medicine contact a poison control center or emergency room at once. NOTE: This medicine is only for you. Do not share this medicine with others. What if I miss a dose? If you miss a dose, take it as soon as you can. If it is almost time for your next dose, take only  that dose. Do not take double or extra doses. What may interact with this medication? bromocriptine certain medicines for blood pressure, heart disease, irregular heartbeat cyclosporine diuretics medicines for diabetes, including insulin quinidine This list may not describe all possible interactions. Give your health care provider a list of all the medicines, herbs, non-prescription drugs, or dietary supplements you use. Also tell them if you smoke, drink alcohol, or use illegal drugs. Some items may interact with your medicine. What should I watch for while using this medication? Visit your doctor or health care professional for regular checks on your progress. To help reduce irritation at the injection site, use a different site for each injection and make sure the solution is at room temperature before use. This medicine may cause decreases in blood sugar. Signs of low blood sugar include chills, cool, pale skin or cold sweats, drowsiness, extreme hunger, fast heartbeat, headache, nausea, nervousness or anxiety, shakiness, trembling, unsteadiness, tiredness, or weakness. Contact your doctor or health care professional right away if you experience any of these symptoms. This medicine may increase blood sugar. Ask your healthcare provider if changes in diet or medicines are needed if you have diabetes. This medicine may cause a decrease in vitamin B12. You should make sure that you get enough vitamin B12 while you are taking this medicine. Discuss the foods you eat and the vitamins you take with your health care professional. What side effects may I notice from receiving this medication? Side effects that you should report to your doctor or health care professional as soon as   possible: allergic reactions like skin rash, itching or hives, swelling of the face, lips, or tongue fast, slow, or irregular heartbeat right upper belly pain severe stomach pain signs and symptoms of high blood sugar such  as being more thirsty or hungry or having to urinate more than normal. You may also feel very tired or have blurry vision. signs and symptoms of low blood sugar such as feeling anxious; confusion; dizziness; increased hunger; unusually weak or tired; increased sweating; shakiness; cold, clammy skin; irritable; headache; blurred vision; fast heartbeat; loss of consciousness unusually weak or tired Side effects that usually do not require medical attention (report to your doctor or health care professional if they continue or are bothersome): diarrhea dizziness gas headache nausea, vomiting pain, redness, or irritation at site where injected upset stomach This list may not describe all possible side effects. Call your doctor for medical advice about side effects. You may report side effects to FDA at 1-800-FDA-1088. Where should I keep my medication? Keep out of the reach of children. Store in a refrigerator between 2 and 8 degrees C (36 and 46 degrees F). Protect from light. Allow to come to room temperature naturally. Do not use artificial heat. If protected from light, the injection may be stored at room temperature between 20 and 30 degrees C (70 and 86 degrees F) for 14 days. After the initial use, throw away any unused portion of a multiple dose vial after 14 days. Throw away unused portions of the ampules after use. NOTE: This sheet is a summary. It may not cover all possible information. If you have questions about this medicine, talk to your doctor, pharmacist, or health care provider.  2022 Elsevier/Gold Standard (2019-02-18 13:33:09)  

## 2021-03-19 NOTE — Progress Notes (Signed)
[place] OFFICE PROGRESS NOTE   Diagnosis: Pancreas neuroendocrine tumor  INTERVAL HISTORY:   Ronald Thompson completed another cycle of capecitabine/temozolomide beginning 02/19/2021.  He reports mild nausea relieved with Zofran.  No mouth sores, diarrhea, or hand/foot pain.  He had malaise during the week following temozolomide.  He is exercising.  Objective:  Vital signs in last 24 hours:  Blood pressure 123/68, pulse 82, temperature 97.8 F (36.6 C), temperature source Oral, resp. rate 20, height '5\' 10"'$  (1.778 m), weight 166 lb 9.6 oz (75.6 kg), SpO2 97 %.    HEENT: No thrush or ulcers Resp: Lungs clear bilaterally Cardio: Regular rate and rhythm GI: No hepatosplenomegaly, nontender Vascular: No leg edema  Skin: Palms without erythema   Lab Results:  Lab Results  Component Value Date   WBC 4.3 03/19/2021   HGB 13.3 03/19/2021   HCT 37.7 (L) 03/19/2021   MCV 100.3 (H) 03/19/2021   PLT 119 (L) 03/19/2021   NEUTROABS 3.3 03/19/2021    CMP  Lab Results  Component Value Date   NA 138 03/19/2021   K 4.1 03/19/2021   CL 101 03/19/2021   CO2 29 03/19/2021   GLUCOSE 128 (H) 03/19/2021   BUN 29 (H) 03/19/2021   CREATININE 1.18 03/19/2021   CALCIUM 9.5 03/19/2021   PROT 6.6 03/19/2021   ALBUMIN 4.3 03/19/2021   AST 26 03/19/2021   ALT 15 03/19/2021   ALKPHOS 52 03/19/2021   BILITOT 1.4 (H) 03/19/2021   GFRNONAA >60 03/19/2021   GFRAA >60 01/19/2020   Medications: I have reviewed the patient's current medications.   Assessment/Plan: Pancreatic neuroendocrine tumor, WHO grade 2, pancreatic head mass and small peripancreatic/celiac nodes on a CT 02/04/2014 EUS revealed evidence of multiple liver metastases-status post an FNA biopsy of a left liver lesion 02/10/2014 confirming a neuroendocrine tumor   Octreotide scan 04/01/2014 with multiple foci of metastatic neuroendocrine tumor in the liver Monthly Sandostatin started 03/16/2014 Restaging CT  07/04/2014 with resolution of a previously noted pancreas head lesion and probable progression of liver metastases Restaging CT 10/31/2014-stable liver lesions, no evidence of a pancreas mass, stable. Restaging CT 02/28/2015-stable hepatic metastases, stable pancreas lesion unchanged Restaging CT 08/30/2015-stable pancreas mass, slight enlargement of hepatic metastases Monthly Sandostatin continued Restaging CTs 01/17/2016 revealed enlargement of liver lesions, no new lesions, enlargement of the pancreas head mass Gallium DOTATATE scan 10/01/2016 confirmed uptake in the liver metastases, pancreas primary, peripancreatic adenopathy, and left iliac bone Cycle 1 Lutathera 03/05/2017 Cycle 2 Lutathera 04/30/2017 Cycle 3 Lutathera 07/02/2017 Cycle 4 Lutathera 08/27/2017 Netspot scan 09/29/2017-decreased radiotracer activity within the pancreas head, peripancreatic lymph node, and solitary skeletal lesion.  Liver lesions have increased in size with a decrease in radiotracer activity Monthly Sandostatin continued Netspot scan 05/20/2018-stable to decreased size of liver lesions, most have decreased SUV, similar uptake in the pancreas lesion, previously noted peripancreatic lymph node has resolved him a mild decrease in uptake associated with a left iliac metastasis, no evidence of disease progression Monthly Sandostatin continued Netspot scan 01/29/2019- overall stable to improved, 1 lesion left hepatic lobe has increased tracer activity-unchanged in size, majority of hepatic lesions have reduced activity compared to the pretreatment scan, decreased activity left iliac bone lesion, stable activity in the head of the pancreas lesion, no new lesions Monthly Sandostatin continued Netspot on September 24, 2019-increase in size of 3 liver lesions with associated stable radiotracer activity, no new lesions, stable small left iliac lesion Cycle 1 salvage Lutathera 10/27/2019 Cycle 2 salvage  Lutathera  12/22/2019 Netspot on 02/15/2020-decrease in radiotracer activity in the liver metastases with a decrease in size of several lesions, no new lesions, no progressive disease in the bowel or mesentery, decreased activity in the left iliac bone lesion Monthly Sandostatin continued  Netspot 10/13/2020-decrease in radiotracer activity in the left and right liver, decreased size of liver lesions, one lesion in the superior right liver has increased in size and has no radiotracer activity, no evidence of metastatic disease outside the liver PET 11/15/2020- moderate hypermetabolic activity associated with several enlarging liver lesions that had limited activity on the dotatate PET, a lesion in the left lateral hepatic lobe with mild persistent dotatate activity has mild peripheral FDG activity and is decreased in size from a dotatate PET 1 year ago.  No hypermetabolic activity in the pancreas or bowel.  No hypermetabolic activity in the previous dotatate avid left iliac lesion Monthly Sandostatin continued Cycle 1 capecitabine/temozolomide 01/22/2021 (capecitabine twice daily days 1 through 14 of each 28-day cycle/temozolomide days 10 through 14) Cycle 2 capecitabine/temozolomide 02/19/2021 Cycle 3 capecitabine/temozolomide 03/19/2021    Chest Seminoma in 1990 treated with BEP and chest radiation at [facility] 3. 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 chronic exertional dyspnea following treatment for the seminoma   5. aortic stenosis on an echocardiogram December 2011, severe aortic stenosis on echocardiogram 12/06/2015, status post TAVR on 04/16/2016 6. lumbar disc surgery 2014   7. acute abdominal pain 02/04/2014-resolved   8.  Diabetes 9.  COVID-19 infection July 2022     Disposition: Ronald Thompson appears stable.  He is tolerating the chemotherapy well.  He  will begin another cycle today.  He is stable from a hematologic standpoint.  He will return for an office and lab visit in 4 weeks.  He continues monthly Sandostatin.  We will plan for a restaging evaluation after cycle 4.  Betsy Coder, MD  03/19/2021  12:18 PM

## 2021-03-20 LAB — CHROMOGRANIN A: Chromogranin A (ng/mL): 8459 ng/mL — ABNORMAL HIGH (ref 0.0–101.8)

## 2021-04-12 ENCOUNTER — Other Ambulatory Visit: Payer: Self-pay | Admitting: Oncology

## 2021-04-12 ENCOUNTER — Other Ambulatory Visit (HOSPITAL_COMMUNITY): Payer: Self-pay

## 2021-04-12 DIAGNOSIS — D3A8 Other benign neuroendocrine tumors: Secondary | ICD-10-CM

## 2021-04-13 ENCOUNTER — Other Ambulatory Visit (HOSPITAL_COMMUNITY): Payer: Self-pay

## 2021-04-13 MED ORDER — CAPECITABINE 500 MG PO TABS
ORAL_TABLET | ORAL | 0 refills | Status: DC
  Filled 2021-04-13: qty 70, 14d supply, fill #0

## 2021-04-13 MED ORDER — TEMOZOLOMIDE 180 MG PO CAPS
360.0000 mg | ORAL_CAPSULE | Freq: Every day | ORAL | 0 refills | Status: DC
  Filled 2021-04-13: qty 10, 5d supply, fill #0

## 2021-04-14 ENCOUNTER — Other Ambulatory Visit (HOSPITAL_COMMUNITY): Payer: Self-pay

## 2021-04-16 ENCOUNTER — Inpatient Hospital Stay: Payer: Medicare Other

## 2021-04-16 ENCOUNTER — Inpatient Hospital Stay: Payer: Medicare Other | Attending: Oncology | Admitting: Oncology

## 2021-04-16 ENCOUNTER — Other Ambulatory Visit (HOSPITAL_COMMUNITY): Payer: Self-pay

## 2021-04-16 DIAGNOSIS — C7B8 Other secondary neuroendocrine tumors: Secondary | ICD-10-CM | POA: Insufficient documentation

## 2021-04-16 DIAGNOSIS — C7A8 Other malignant neuroendocrine tumors: Secondary | ICD-10-CM | POA: Insufficient documentation

## 2021-04-18 ENCOUNTER — Inpatient Hospital Stay (HOSPITAL_BASED_OUTPATIENT_CLINIC_OR_DEPARTMENT_OTHER): Payer: Medicare Other | Admitting: Oncology

## 2021-04-18 ENCOUNTER — Other Ambulatory Visit: Payer: Self-pay

## 2021-04-18 ENCOUNTER — Inpatient Hospital Stay: Payer: Medicare Other

## 2021-04-18 VITALS — BP 124/72 | HR 78 | Temp 98.1°F | Resp 18 | Ht 70.0 in | Wt 166.8 lb

## 2021-04-18 DIAGNOSIS — D3A8 Other benign neuroendocrine tumors: Secondary | ICD-10-CM

## 2021-04-18 DIAGNOSIS — C7B8 Other secondary neuroendocrine tumors: Secondary | ICD-10-CM | POA: Diagnosis not present

## 2021-04-18 DIAGNOSIS — C7A8 Other malignant neuroendocrine tumors: Secondary | ICD-10-CM | POA: Diagnosis not present

## 2021-04-18 LAB — CBC WITH DIFFERENTIAL (CANCER CENTER ONLY)
Abs Immature Granulocytes: 0 10*3/uL (ref 0.00–0.07)
Basophils Absolute: 0 10*3/uL (ref 0.0–0.1)
Basophils Relative: 0 %
Eosinophils Absolute: 0.1 10*3/uL (ref 0.0–0.5)
Eosinophils Relative: 2 %
HCT: 39.2 % (ref 39.0–52.0)
Hemoglobin: 13.4 g/dL (ref 13.0–17.0)
Immature Granulocytes: 0 %
Lymphocytes Relative: 10 %
Lymphs Abs: 0.4 10*3/uL — ABNORMAL LOW (ref 0.7–4.0)
MCH: 35 pg — ABNORMAL HIGH (ref 26.0–34.0)
MCHC: 34.2 g/dL (ref 30.0–36.0)
MCV: 102.3 fL — ABNORMAL HIGH (ref 80.0–100.0)
Monocytes Absolute: 0.7 10*3/uL (ref 0.1–1.0)
Monocytes Relative: 17 %
Neutro Abs: 2.6 10*3/uL (ref 1.7–7.7)
Neutrophils Relative %: 71 %
Platelet Count: 115 10*3/uL — ABNORMAL LOW (ref 150–400)
RBC: 3.83 MIL/uL — ABNORMAL LOW (ref 4.22–5.81)
RDW: 15.5 % (ref 11.5–15.5)
WBC Count: 3.8 10*3/uL — ABNORMAL LOW (ref 4.0–10.5)
nRBC: 0 % (ref 0.0–0.2)

## 2021-04-18 LAB — CMP (CANCER CENTER ONLY)
ALT: 16 U/L (ref 0–44)
AST: 25 U/L (ref 15–41)
Albumin: 4.5 g/dL (ref 3.5–5.0)
Alkaline Phosphatase: 66 U/L (ref 38–126)
Anion gap: 8 (ref 5–15)
BUN: 28 mg/dL — ABNORMAL HIGH (ref 8–23)
CO2: 29 mmol/L (ref 22–32)
Calcium: 9.8 mg/dL (ref 8.9–10.3)
Chloride: 101 mmol/L (ref 98–111)
Creatinine: 1.19 mg/dL (ref 0.61–1.24)
GFR, Estimated: >60 mL/min (ref >60)
Glucose, Bld: 106 mg/dL — ABNORMAL HIGH (ref 70–99)
Potassium: 4.1 mmol/L (ref 3.5–5.1)
Sodium: 138 mmol/L (ref 135–145)
Total Bilirubin: 1.4 mg/dL — ABNORMAL HIGH (ref 0.3–1.2)
Total Protein: 7 g/dL (ref 6.5–8.1)

## 2021-04-18 MED ORDER — OCTREOTIDE ACETATE 30 MG IM KIT
30.0000 mg | PACK | Freq: Once | INTRAMUSCULAR | Status: AC
  Administered 2021-04-18: 30 mg via INTRAMUSCULAR

## 2021-04-18 NOTE — Progress Notes (Signed)
[place] OFFICE PROGRESS NOTE   Diagnosis: Neuroendocrine tumor  INTERVAL HISTORY:   Mr. Madole returns for scheduled visit.  He completed another cycle of capecitabine/temozolomide beginning 03/19/2021.  No mouth sores, nausea, diarrhea, or hand/foot pain.  He feels well.  He began another cycle of capecitabine yesterday.  Objective:  Vital signs in last 24 hours:  Blood pressure 124/72, pulse 78, temperature 98.1 F (36.7 C), temperature source Oral, resp. rate 18, height '5\' 10"'$  (1.778 m), weight 166 lb 12.8 oz (75.7 kg), SpO2 98 %.    HEENT: No thrush or ulcers Resp: Decreased breath sounds throughout the left chest compared to the right, no respiratory distress Cardio: Regular rate and rhythm, 2/6 diastolic murmur GI: No hepatosplenomegaly Vascular: No leg edema  Skin: Mild hyperpigmentation of the hands   Lab Results:  Lab Results  Component Value Date   WBC 3.8 (L) 04/18/2021   HGB 13.4 04/18/2021   HCT 39.2 04/18/2021   MCV 102.3 (H) 04/18/2021   PLT 115 (L) 04/18/2021   NEUTROABS 2.6 04/18/2021    CMP  Lab Results  Component Value Date   NA 138 03/19/2021   K 4.1 03/19/2021   CL 101 03/19/2021   CO2 29 03/19/2021   GLUCOSE 128 (H) 03/19/2021   BUN 29 (H) 03/19/2021   CREATININE 1.18 03/19/2021   CALCIUM 9.5 03/19/2021   PROT 6.6 03/19/2021   ALBUMIN 4.3 03/19/2021   AST 26 03/19/2021   ALT 15 03/19/2021   ALKPHOS 52 03/19/2021   BILITOT 1.4 (H) 03/19/2021   GFRNONAA >60 03/19/2021   GFRAA >60 01/19/2020     Medications: I have reviewed the patient's current medications.   Assessment/Plan:  Pancreatic neuroendocrine tumor, WHO grade 2, pancreatic head mass and small peripancreatic/celiac nodes on a CT 02/04/2014 EUS revealed evidence of multiple liver metastases-status post an FNA biopsy of a left liver lesion 02/10/2014 confirming a neuroendocrine tumor   Octreotide scan 04/01/2014 with multiple foci of metastatic  neuroendocrine tumor in the liver Monthly Sandostatin started 03/16/2014 Restaging CT 07/04/2014 with resolution of a previously noted pancreas head lesion and probable progression of liver metastases Restaging CT 10/31/2014-stable liver lesions, no evidence of a pancreas mass, stable. Restaging CT 02/28/2015-stable hepatic metastases, stable pancreas lesion unchanged Restaging CT 08/30/2015-stable pancreas mass, slight enlargement of hepatic metastases Monthly Sandostatin continued Restaging CTs 01/17/2016 revealed enlargement of liver lesions, no new lesions, enlargement of the pancreas head mass Gallium DOTATATE scan 10/01/2016 confirmed uptake in the liver metastases, pancreas primary, peripancreatic adenopathy, and left iliac bone Cycle 1 Lutathera 03/05/2017 Cycle 2 Lutathera 04/30/2017 Cycle 3 Lutathera 07/02/2017 Cycle 4 Lutathera 08/27/2017 Netspot scan 09/29/2017-decreased radiotracer activity within the pancreas head, peripancreatic lymph node, and solitary skeletal lesion.  Liver lesions have increased in size with a decrease in radiotracer activity Monthly Sandostatin continued Netspot scan 05/20/2018-stable to decreased size of liver lesions, most have decreased SUV, similar uptake in the pancreas lesion, previously noted peripancreatic lymph node has resolved him a mild decrease in uptake associated with a left iliac metastasis, no evidence of disease progression Monthly Sandostatin continued Netspot scan 01/29/2019- overall stable to improved, 1 lesion left hepatic lobe has increased tracer activity-unchanged in size, majority of hepatic lesions have reduced activity compared to the pretreatment scan, decreased activity left iliac bone lesion, stable activity in the head of the pancreas lesion, no new lesions Monthly Sandostatin continued Netspot on September 24, 2019-increase in size of 3 liver lesions with associated stable radiotracer activity, no  new lesions, stable small left  iliac lesion Cycle 1 salvage Lutathera 10/27/2019 Cycle 2 salvage Lutathera 12/22/2019 Netspot on 02/15/2020-decrease in radiotracer activity in the liver metastases with a decrease in size of several lesions, no new lesions, no progressive disease in the bowel or mesentery, decreased activity in the left iliac bone lesion Monthly Sandostatin continued  Netspot 10/13/2020-decrease in radiotracer activity in the left and right liver, decreased size of liver lesions, one lesion in the superior right liver has increased in size and has no radiotracer activity, no evidence of metastatic disease outside the liver PET 11/15/2020- moderate hypermetabolic activity associated with several enlarging liver lesions that had limited activity on the dotatate PET, a lesion in the left lateral hepatic lobe with mild persistent dotatate activity has mild peripheral FDG activity and is decreased in size from a dotatate PET 1 year ago.  No hypermetabolic activity in the pancreas or bowel.  No hypermetabolic activity in the previous dotatate avid left iliac lesion Monthly Sandostatin continued Cycle 1 capecitabine/temozolomide 01/22/2021 (capecitabine twice daily days 1 through 14 of each 28-day cycle/temozolomide days 10 through 14) Cycle 2 capecitabine/temozolomide 02/19/2021 Cycle 3 capecitabine/temozolomide 03/19/2021 Cycle 4 capecitabine/temozolomide 04/17/2021    Chest Seminoma in 1990 treated with BEP and chest radiation at [facility] 3. 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 chronic exertional dyspnea following treatment for the seminoma   5. aortic stenosis on an echocardiogram December 2011, severe aortic stenosis on echocardiogram 12/06/2015, status post TAVR on 04/16/2016 6. lumbar disc surgery 2014   7. acute abdominal pain 02/04/2014-resolved   8.  Diabetes 9.   COVID-19 infection July 2022     Disposition: Mr. Krus appears stable.  He will complete cycle 4 capecitabine/temozolomide over the next 2 weeks.  He continues monthly Sandostatin.  He will undergo a restaging evaluation after this cycle.  He will call for a fever or bleeding.  He will return for an office visit on 05/14/2021.  Betsy Coder, MD  04/18/2021  8:37 AM

## 2021-04-18 NOTE — Patient Instructions (Signed)
Octreotide injection solution What is this medication? OCTREOTIDE (ok TREE oh tide) is used to reduce blood levels of growth hormone in patients with a condition called acromegaly. This medicine also reduces flushing and watery diarrhea caused by certain types of cancer. This medicine may be used for other purposes; ask your health care provider or pharmacist if you have questions. COMMON BRAND NAME(S): Bynfezia, Sandostatin What should I tell my care team before I take this medication? They need to know if you have any of these conditions: diabetes gallbladder disease kidney disease liver disease thyroid disease an unusual or allergic reaction to octreotide, other medicines, foods, dyes, or preservatives pregnant or trying to get pregnant breast-feeding How should I use this medication? This medicine is for injection under the skin or into a vein (only in emergency situations). It is usually given by a health care professional in a hospital or clinic setting. If you get this medicine at home, you will be taught how to prepare and give this medicine. Allow the injection solution to come to room temperature before use. Do not warm it artificially. Use exactly as directed. Take your medicine at regular intervals. Do not take your medicine more often than directed. It is important that you put your used needles and syringes in a special sharps container. Do not put them in a trash can. If you do not have a sharps container, call your pharmacist or healthcare provider to get one. Talk to your pediatrician regarding the use of this medicine in children. Special care may be needed. Overdosage: If you think you have taken too much of this medicine contact a poison control center or emergency room at once. NOTE: This medicine is only for you. Do not share this medicine with others. What if I miss a dose? If you miss a dose, take it as soon as you can. If it is almost time for your next dose, take only  that dose. Do not take double or extra doses. What may interact with this medication? bromocriptine certain medicines for blood pressure, heart disease, irregular heartbeat cyclosporine diuretics medicines for diabetes, including insulin quinidine This list may not describe all possible interactions. Give your health care provider a list of all the medicines, herbs, non-prescription drugs, or dietary supplements you use. Also tell them if you smoke, drink alcohol, or use illegal drugs. Some items may interact with your medicine. What should I watch for while using this medication? Visit your doctor or health care professional for regular checks on your progress. To help reduce irritation at the injection site, use a different site for each injection and make sure the solution is at room temperature before use. This medicine may cause decreases in blood sugar. Signs of low blood sugar include chills, cool, pale skin or cold sweats, drowsiness, extreme hunger, fast heartbeat, headache, nausea, nervousness or anxiety, shakiness, trembling, unsteadiness, tiredness, or weakness. Contact your doctor or health care professional right away if you experience any of these symptoms. This medicine may increase blood sugar. Ask your healthcare provider if changes in diet or medicines are needed if you have diabetes. This medicine may cause a decrease in vitamin B12. You should make sure that you get enough vitamin B12 while you are taking this medicine. Discuss the foods you eat and the vitamins you take with your health care professional. What side effects may I notice from receiving this medication? Side effects that you should report to your doctor or health care professional as soon as   possible: allergic reactions like skin rash, itching or hives, swelling of the face, lips, or tongue fast, slow, or irregular heartbeat right upper belly pain severe stomach pain signs and symptoms of high blood sugar such  as being more thirsty or hungry or having to urinate more than normal. You may also feel very tired or have blurry vision. signs and symptoms of low blood sugar such as feeling anxious; confusion; dizziness; increased hunger; unusually weak or tired; increased sweating; shakiness; cold, clammy skin; irritable; headache; blurred vision; fast heartbeat; loss of consciousness unusually weak or tired Side effects that usually do not require medical attention (report to your doctor or health care professional if they continue or are bothersome): diarrhea dizziness gas headache nausea, vomiting pain, redness, or irritation at site where injected upset stomach This list may not describe all possible side effects. Call your doctor for medical advice about side effects. You may report side effects to FDA at 1-800-FDA-1088. Where should I keep my medication? Keep out of the reach of children. Store in a refrigerator between 2 and 8 degrees C (36 and 46 degrees F). Protect from light. Allow to come to room temperature naturally. Do not use artificial heat. If protected from light, the injection may be stored at room temperature between 20 and 30 degrees C (70 and 86 degrees F) for 14 days. After the initial use, throw away any unused portion of a multiple dose vial after 14 days. Throw away unused portions of the ampules after use. NOTE: This sheet is a summary. It may not cover all possible information. If you have questions about this medicine, talk to your doctor, pharmacist, or health care provider.  2022 Elsevier/Gold Standard (2019-02-18 13:33:09)  

## 2021-05-04 ENCOUNTER — Encounter: Payer: Self-pay | Admitting: *Deleted

## 2021-05-04 NOTE — Progress Notes (Signed)
Left VM for nuclear med requesting to schedule NETspot by 05/10/21. It has been authorized. Last octreodide injection 04/18/21.

## 2021-05-07 ENCOUNTER — Other Ambulatory Visit (HOSPITAL_COMMUNITY): Payer: Self-pay

## 2021-05-11 ENCOUNTER — Other Ambulatory Visit: Payer: Self-pay

## 2021-05-11 ENCOUNTER — Encounter (HOSPITAL_COMMUNITY)
Admission: RE | Admit: 2021-05-11 | Discharge: 2021-05-11 | Disposition: A | Discharge status: Home / Self Care | Payer: Medicare Other | Source: Ambulatory Visit | Attending: Oncology | Admitting: Oncology

## 2021-05-11 DIAGNOSIS — C7A8 Other malignant neuroendocrine tumors: Secondary | ICD-10-CM | POA: Diagnosis not present

## 2021-05-11 DIAGNOSIS — J9811 Atelectasis: Secondary | ICD-10-CM | POA: Diagnosis not present

## 2021-05-11 DIAGNOSIS — C787 Secondary malignant neoplasm of liver and intrahepatic bile duct: Secondary | ICD-10-CM | POA: Insufficient documentation

## 2021-05-11 DIAGNOSIS — J984 Other disorders of lung: Secondary | ICD-10-CM | POA: Diagnosis not present

## 2021-05-11 DIAGNOSIS — D3A8 Other benign neuroendocrine tumors: Secondary | ICD-10-CM | POA: Diagnosis not present

## 2021-05-11 LAB — GLUCOSE, CAPILLARY: Glucose-Capillary: 98 mg/dL (ref 70–99)

## 2021-05-11 MED ORDER — FLUDEOXYGLUCOSE F - 18 (FDG) INJECTION
8.3000 | Freq: Once | INTRAVENOUS | Status: AC | PRN
  Administered 2021-05-11: 8.3 via INTRAVENOUS

## 2021-05-14 ENCOUNTER — Other Ambulatory Visit: Payer: Self-pay

## 2021-05-14 ENCOUNTER — Other Ambulatory Visit (HOSPITAL_COMMUNITY): Payer: Self-pay

## 2021-05-14 ENCOUNTER — Inpatient Hospital Stay: Payer: Medicare Other

## 2021-05-14 ENCOUNTER — Inpatient Hospital Stay: Payer: Medicare Other | Attending: Oncology | Admitting: Oncology

## 2021-05-14 VITALS — BP 130/78 | HR 82 | Temp 97.8°F | Resp 18 | Ht 70.0 in | Wt 165.8 lb

## 2021-05-14 DIAGNOSIS — C7A8 Other malignant neuroendocrine tumors: Secondary | ICD-10-CM | POA: Insufficient documentation

## 2021-05-14 DIAGNOSIS — C7B8 Other secondary neuroendocrine tumors: Secondary | ICD-10-CM | POA: Diagnosis not present

## 2021-05-14 DIAGNOSIS — D3A8 Other benign neuroendocrine tumors: Secondary | ICD-10-CM | POA: Diagnosis not present

## 2021-05-14 LAB — CMP (CANCER CENTER ONLY)
ALT: 14 U/L (ref 0–44)
AST: 26 U/L (ref 15–41)
Albumin: 4.4 g/dL (ref 3.5–5.0)
Alkaline Phosphatase: 66 U/L (ref 38–126)
Anion gap: 6 (ref 5–15)
BUN: 35 mg/dL — ABNORMAL HIGH (ref 8–23)
CO2: 32 mmol/L (ref 22–32)
Calcium: 9.9 mg/dL (ref 8.9–10.3)
Chloride: 99 mmol/L (ref 98–111)
Creatinine: 1.42 mg/dL — ABNORMAL HIGH (ref 0.61–1.24)
GFR, Estimated: 54 mL/min — ABNORMAL LOW (ref >60)
Glucose, Bld: 100 mg/dL — ABNORMAL HIGH (ref 70–99)
Potassium: 4 mmol/L (ref 3.5–5.1)
Sodium: 137 mmol/L (ref 135–145)
Total Bilirubin: 1.4 mg/dL — ABNORMAL HIGH (ref 0.3–1.2)
Total Protein: 7 g/dL (ref 6.5–8.1)

## 2021-05-14 LAB — CBC WITH DIFFERENTIAL (CANCER CENTER ONLY)
Abs Immature Granulocytes: 0.01 K/uL (ref 0.00–0.07)
Basophils Absolute: 0 K/uL (ref 0.0–0.1)
Basophils Relative: 0 %
Eosinophils Absolute: 0.1 K/uL (ref 0.0–0.5)
Eosinophils Relative: 2 %
HCT: 36.9 % — ABNORMAL LOW (ref 39.0–52.0)
Hemoglobin: 12.7 g/dL — ABNORMAL LOW (ref 13.0–17.0)
Immature Granulocytes: 0 %
Lymphocytes Relative: 8 %
Lymphs Abs: 0.4 K/uL — ABNORMAL LOW (ref 0.7–4.0)
MCH: 36.6 pg — ABNORMAL HIGH (ref 26.0–34.0)
MCHC: 34.4 g/dL (ref 30.0–36.0)
MCV: 106.3 fL — ABNORMAL HIGH (ref 80.0–100.0)
Monocytes Absolute: 0.9 K/uL (ref 0.1–1.0)
Monocytes Relative: 17 %
Neutro Abs: 3.7 K/uL (ref 1.7–7.7)
Neutrophils Relative %: 73 %
Platelet Count: 122 K/uL — ABNORMAL LOW (ref 150–400)
RBC: 3.47 MIL/uL — ABNORMAL LOW (ref 4.22–5.81)
RDW: 15.1 % (ref 11.5–15.5)
WBC Count: 5.1 K/uL (ref 4.0–10.5)
nRBC: 0 % (ref 0.0–0.2)

## 2021-05-14 MED ORDER — OCTREOTIDE ACETATE 30 MG IM KIT
30.0000 mg | PACK | Freq: Once | INTRAMUSCULAR | Status: AC
  Administered 2021-05-14: 30 mg via INTRAMUSCULAR

## 2021-05-14 NOTE — Progress Notes (Addendum)
[place] OFFICE PROGRESS NOTE   Diagnosis: Neuroendocrine tumor  INTERVAL HISTORY:   Ronald Thompson completed another cycle of Xeloda/temozolomide beginning 04/17/2021.  No mouth sores, nausea, or hand/foot pain.  He reports a few episodes of diarrhea.  He has malaise beginning at the end of treatment and lasting for up to 2 weeks.  He feels well at present.  No dyspnea.  He is exercising.  Objective:  Vital signs in last 24 hours:  Blood pressure 130/78, pulse 82, temperature 97.8 F (36.6 C), temperature source Oral, resp. rate 18, height 5\' 10"  (1.778 m), weight 165 lb 12.8 oz (75.2 kg), SpO2 97 %.    HEENT: No thrush or ulcers Resp: Lungs clear bilaterally Cardio: Regular rate and rhythm GI: No hepatomegaly Vascular: No leg edema Skin: Mild skin thickening and hyperpigmentation of the palms    Lab Results:  Lab Results  Component Value Date   WBC 5.1 05/14/2021   HGB 12.7 (L) 05/14/2021   HCT 36.9 (L) 05/14/2021   MCV 106.3 (H) 05/14/2021   PLT 122 (L) 05/14/2021   NEUTROABS 3.7 05/14/2021    CMP  Lab Results  Component Value Date   NA 137 05/14/2021   K 4.0 05/14/2021   CL 99 05/14/2021   CO2 32 05/14/2021   GLUCOSE 100 (H) 05/14/2021   BUN 35 (H) 05/14/2021   CREATININE 1.42 (H) 05/14/2021   CALCIUM 9.9 05/14/2021   PROT 7.0 05/14/2021   ALBUMIN 4.4 05/14/2021   AST 26 05/14/2021   ALT 14 05/14/2021   ALKPHOS 66 05/14/2021   BILITOT 1.4 (H) 05/14/2021   GFRNONAA 54 (L) 05/14/2021   GFRAA >60 01/19/2020   Medications: I have reviewed the patient's current medications.   Assessment/Plan: Pancreatic neuroendocrine tumor, WHO grade 2, pancreatic head mass and small peripancreatic/celiac nodes on a CT 02/04/2014 EUS revealed evidence of multiple liver metastases-status post an FNA biopsy of a left liver lesion 02/10/2014 confirming a neuroendocrine tumor   Octreotide scan 04/01/2014 with multiple foci of metastatic neuroendocrine tumor in  the liver Monthly Sandostatin started 03/16/2014 Restaging CT 07/04/2014 with resolution of a previously noted pancreas head lesion and probable progression of liver metastases Restaging CT 10/31/2014-stable liver lesions, no evidence of a pancreas mass, stable. Restaging CT 02/28/2015-stable hepatic metastases, stable pancreas lesion unchanged Restaging CT 08/30/2015-stable pancreas mass, slight enlargement of hepatic metastases Monthly Sandostatin continued Restaging CTs 01/17/2016 revealed enlargement of liver lesions, no new lesions, enlargement of the pancreas head mass Gallium DOTATATE scan 10/01/2016 confirmed uptake in the liver metastases, pancreas primary, peripancreatic adenopathy, and left iliac bone Cycle 1 Lutathera 03/05/2017 Cycle 2 Lutathera 04/30/2017 Cycle 3 Lutathera 07/02/2017 Cycle 4 Lutathera 08/27/2017 Netspot scan 09/29/2017-decreased radiotracer activity within the pancreas head, peripancreatic lymph node, and solitary skeletal lesion.  Liver lesions have increased in size with a decrease in radiotracer activity Monthly Sandostatin continued Netspot scan 05/20/2018-stable to decreased size of liver lesions, most have decreased SUV, similar uptake in the pancreas lesion, previously noted peripancreatic lymph node has resolved him a mild decrease in uptake associated with a left iliac metastasis, no evidence of disease progression Monthly Sandostatin continued Netspot scan 01/29/2019- overall stable to improved, 1 lesion left hepatic lobe has increased tracer activity-unchanged in size, majority of hepatic lesions have reduced activity compared to the pretreatment scan, decreased activity left iliac bone lesion, stable activity in the head of the pancreas lesion, no new lesions Monthly Sandostatin continued Netspot on September 24, 2019-increase in size of 3  liver lesions with associated stable radiotracer activity, no new lesions, stable small left iliac lesion Cycle 1  salvage Lutathera 10/27/2019 Cycle 2 salvage Lutathera 12/22/2019 Netspot on 02/15/2020-decrease in radiotracer activity in the liver metastases with a decrease in size of several lesions, no new lesions, no progressive disease in the bowel or mesentery, decreased activity in the left iliac bone lesion Monthly Sandostatin continued  Netspot 10/13/2020-decrease in radiotracer activity in the left and right liver, decreased size of liver lesions, one lesion in the superior right liver has increased in size and has no radiotracer activity, no evidence of metastatic disease outside the liver PET 11/15/2020- moderate hypermetabolic activity associated with several enlarging liver lesions that had limited activity on the dotatate PET, a lesion in the left lateral hepatic lobe with mild persistent dotatate activity has mild peripheral FDG activity and is decreased in size from a dotatate PET 1 year ago.  No hypermetabolic activity in the pancreas or bowel.  No hypermetabolic activity in the previous dotatate avid left iliac lesion Monthly Sandostatin continued Cycle 1 capecitabine/temozolomide 01/22/2021 (capecitabine twice daily days 1 through 14 of each 28-day cycle/temozolomide days 10 through 14) Cycle 2 capecitabine/temozolomide 02/19/2021 Cycle 3 capecitabine/temozolomide 03/19/2021 Cycle 4 capecitabine/temozolomide 04/17/2021    Chest Seminoma in 1990 treated with BEP and chest radiation at [facility] 3. 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 chronic exertional dyspnea following treatment for the seminoma   5. aortic stenosis on an echocardiogram December 2011, severe aortic stenosis on echocardiogram 12/06/2015, status post TAVR on 04/16/2016 6. lumbar disc surgery 2014   7. acute abdominal pain 02/04/2014-resolved   8.  Diabetes 9.  COVID-19 infection July  2022    Disposition: Ronald Thompson appears stable.  He has completed 4 cycles of capecitabine/temozolomide.  He underwent a restaging PET on 05/11/2021.  I reviewed the PET images with Ronald Thompson.  The final reading from the PET is pending.  The plan is to continue capecitabine/temozolomide if the PET reveals stable or improved disease.  He will complete another treatment with Sandostatin today.  He will begin the next cycle of capecitabine/temozolomide on 05/16/2021.  Ronald Thompson will return for an office and lab visit in 1 month.  We will add a chromogranin a level to the lab from today.  Betsy Coder, MD  05/14/2021  8:32 AM Dr. Leonia Reeves reviewed the PET images.  He confirms there has been slight enlargement of several liver lesions, the SUV is slightly higher.  No new lesions.  1 lesion is smaller.  I discussed the PET findings with Ronald Thompson by telephone on 05/21/2021.  We discussed treatment options including continuation of Xeloda/Temodar, changing to everolimus, Y 90, and referral back to Dr. Leamon Arnt.  There was a greater than 28-month gap between the baseline PET and initiating the current treatment regimen.  It is possible the progression seen on PET occurred during the interval off of treatment.  He is comfortable continuing Xeloda/Temodar for now.  We will plan for a restaging PET after 3 more cycles.  He will begin another cycle of Xeloda/Temodar on 05/23/2021.  He will return for an office visit and Sandostatin on 06/18/2021.

## 2021-05-14 NOTE — Patient Instructions (Signed)
[place] [facility]  Discharge Instructions: Thank you for choosing [facility] to provide your oncology and hematology care.   If you have a lab appointment with the [facility], please go directly to the [facility] and check in at the registration area.   Wear comfortable clothing and clothing appropriate for easy access to any Portacath or PICC line.   We strive to give you quality time with your provider. You may need to reschedule your appointment if you arrive late (15 or more minutes).  Arriving late affects you and other patients whose appointments are after yours.  Also, if you miss three or more appointments without notifying the office, you may be dismissed from the clinic at the provider's discretion.      For prescription refill requests, have your pharmacy contact our office and allow 72 hours for refills to be completed.    Today you received the following chemotherapy and/or immunotherapy agents SANDOSTATIN       To help prevent nausea and vomiting after your treatment, we encourage you to take your nausea medication as directed.  BELOW ARE SYMPTOMS THAT SHOULD BE REPORTED IMMEDIATELY: *FEVER GREATER THAN 100.4 F (38 C) OR HIGHER *CHILLS OR SWEATING *NAUSEA AND VOMITING THAT IS NOT CONTROLLED WITH YOUR NAUSEA MEDICATION *UNUSUAL SHORTNESS OF BREATH *UNUSUAL BRUISING OR BLEEDING *URINARY PROBLEMS (pain or burning when urinating, or frequent urination) *BOWEL PROBLEMS (unusual diarrhea, constipation, pain near the anus) TENDERNESS IN MOUTH AND THROAT WITH OR WITHOUT PRESENCE OF ULCERS (sore throat, sores in mouth, or a toothache) UNUSUAL RASH, SWELLING OR PAIN  UNUSUAL VAGINAL DISCHARGE OR ITCHING   Items with * indicate a potential emergency and should be followed up as soon as possible or go to the Emergency Department if any problems should occur.  Please show the CHEMOTHERAPY ALERT CARD or IMMUNOTHERAPY ALERT CARD at check-in to  the Emergency Department and triage nurse.  Should you have questions after your visit or need to cancel or reschedule your appointment, please contact [facility]  Dept: [phone]  and follow the prompts.  Office hours are 8:00 a.m. to 4:30 p.m. Monday - Friday. Please note that voicemails left after 4:00 p.m. may not be returned until the following business day.  We are closed weekends and major holidays. You have access to a nurse at all times for urgent questions. Please call the main number to the clinic Dept: [phone] and follow the prompts.   For any non-urgent questions, you may also contact your provider using MyChart. We now offer e-Visits for anyone 62 and older to request care online for non-urgent symptoms. For details visit [email or web address].   Also download the MyChart app! Go to the app store, search "MyChart", open the app, select [facility], and log in with your MyChart username and password.  Due to Covid, a mask is required upon entering the hospital/clinic. If you do not have a mask, one will be given to you upon arrival. For doctor visits, patients may have 1 support person aged 27 or older with them. For treatment visits, patients cannot have anyone with them due to current Covid guidelines and our immunocompromised population.   Octreotide injection solution What is this medication? OCTREOTIDE (ok TREE oh tide) is used to reduce blood levels of growth hormone in patients with a condition called acromegaly. This medicine also reduces flushing and watery diarrhea caused by certain types of cancer. This medicine may be used for  other purposes; ask your health care provider or pharmacist if you have questions. COMMON BRAND NAME(S): Leatha Gilding, Sandostatin What should I tell my care team before I take this medication? They need to know if you have any of these conditions: diabetes gallbladder disease kidney disease liver disease thyroid  disease an unusual or allergic reaction to octreotide, other medicines, foods, dyes, or preservatives pregnant or trying to get pregnant breast-feeding How should I use this medication? This medicine is for injection under the skin or into a vein (only in emergency situations). It is usually given by a health care professional in a hospital or clinic setting. If you get this medicine at home, you will be taught how to prepare and give this medicine. Allow the injection solution to come to room temperature before use. Do not warm it artificially. Use exactly as directed. Take your medicine at regular intervals. Do not take your medicine more often than directed. It is important that you put your used needles and syringes in a special sharps container. Do not put them in a trash can. If you do not have a sharps container, call your pharmacist or healthcare provider to get one. Talk to your pediatrician regarding the use of this medicine in children. Special care may be needed. Overdosage: If you think you have taken too much of this medicine contact a poison control center or emergency room at once. NOTE: This medicine is only for you. Do not share this medicine with others. What if I miss a dose? If you miss a dose, take it as soon as you can. If it is almost time for your next dose, take only that dose. Do not take double or extra doses. What may interact with this medication? bromocriptine certain medicines for blood pressure, heart disease, irregular heartbeat cyclosporine diuretics medicines for diabetes, including insulin quinidine This list may not describe all possible interactions. Give your health care provider a list of all the medicines, herbs, non-prescription drugs, or dietary supplements you use. Also tell them if you smoke, drink alcohol, or use illegal drugs. Some items may interact with your medicine. What should I watch for while using this medication? Visit your doctor or  health care professional for regular checks on your progress. To help reduce irritation at the injection site, use a different site for each injection and make sure the solution is at room temperature before use. This medicine may cause decreases in blood sugar. Signs of low blood sugar include chills, cool, pale skin or cold sweats, drowsiness, extreme hunger, fast heartbeat, headache, nausea, nervousness or anxiety, shakiness, trembling, unsteadiness, tiredness, or weakness. Contact your doctor or health care professional right away if you experience any of these symptoms. This medicine may increase blood sugar. Ask your healthcare provider if changes in diet or medicines are needed if you have diabetes. This medicine may cause a decrease in vitamin B12. You should make sure that you get enough vitamin B12 while you are taking this medicine. Discuss the foods you eat and the vitamins you take with your health care professional. What side effects may I notice from receiving this medication? Side effects that you should report to your doctor or health care professional as soon as possible: allergic reactions like skin rash, itching or hives, swelling of the face, lips, or tongue fast, slow, or irregular heartbeat right upper belly pain severe stomach pain signs and symptoms of high blood sugar such as being more thirsty or hungry or having to urinate more  than normal. You may also feel very tired or have blurry vision. signs and symptoms of low blood sugar such as feeling anxious; confusion; dizziness; increased hunger; unusually weak or tired; increased sweating; shakiness; cold, clammy skin; irritable; headache; blurred vision; fast heartbeat; loss of consciousness unusually weak or tired Side effects that usually do not require medical attention (report to your doctor or health care professional if they continue or are bothersome): diarrhea dizziness gas headache nausea, vomiting pain,  redness, or irritation at site where injected upset stomach This list may not describe all possible side effects. Call your doctor for medical advice about side effects. You may report side effects to FDA at 1-800-FDA-1088. Where should I keep my medication? Keep out of the reach of children. Store in a refrigerator between 2 and 8 degrees C (36 and 46 degrees F). Protect from light. Allow to come to room temperature naturally. Do not use artificial heat. If protected from light, the injection may be stored at room temperature between 20 and 30 degrees C (70 and 86 degrees F) for 14 days. After the initial use, throw away any unused portion of a multiple dose vial after 14 days. Throw away unused portions of the ampules after use. NOTE: This sheet is a summary. It may not cover all possible information. If you have questions about this medicine, talk to your doctor, pharmacist, or health care provider.  2022 Elsevier/Gold Standard (2019-02-18 13:33:09)

## 2021-05-15 ENCOUNTER — Other Ambulatory Visit: Payer: Self-pay | Admitting: Oncology

## 2021-05-15 ENCOUNTER — Other Ambulatory Visit (HOSPITAL_COMMUNITY): Payer: Self-pay

## 2021-05-15 ENCOUNTER — Telehealth: Payer: Self-pay

## 2021-05-15 DIAGNOSIS — D3A8 Other benign neuroendocrine tumors: Secondary | ICD-10-CM

## 2021-05-15 NOTE — Telephone Encounter (Signed)
TC from Pt inquiring about Pet scan report. Per Dr [name] informed Pt that Dr [name] is waiting for Dr Ilda Foil to review report and Pt should hold medication until he calls. Pt verbalized understanding. No further problems or concerns noted.

## 2021-05-17 ENCOUNTER — Other Ambulatory Visit: Payer: Self-pay | Admitting: Oncology

## 2021-05-17 ENCOUNTER — Other Ambulatory Visit (HOSPITAL_COMMUNITY): Payer: Self-pay

## 2021-05-17 ENCOUNTER — Telehealth: Payer: Self-pay

## 2021-05-17 DIAGNOSIS — D3A8 Other benign neuroendocrine tumors: Secondary | ICD-10-CM

## 2021-05-17 NOTE — Telephone Encounter (Signed)
On Hold per Dr [name]

## 2021-05-17 NOTE — Telephone Encounter (Signed)
TC to Ronald Thompson Per Dr [name] to inform Ronald Thompson that his medication xeloda and temodar on hold until he speaks with Dr. Ilda Foil informed Ronald Thompson Dr Ilda Foil is out of office until Monday and he should receive a call from Dr [name] on Monday. Ronald Thompson verbalized understanding

## 2021-05-21 MED ORDER — TEMOZOLOMIDE 180 MG PO CAPS
360.0000 mg | ORAL_CAPSULE | Freq: Every day | ORAL | 0 refills | Status: DC
  Filled 2021-05-21: qty 10, 28d supply, fill #0

## 2021-05-21 MED FILL — Capecitabine Tab 500 MG: ORAL | 28 days supply | Qty: 70 | Fill #0 | Status: AC

## 2021-05-22 ENCOUNTER — Other Ambulatory Visit (HOSPITAL_COMMUNITY): Payer: Self-pay

## 2021-05-23 ENCOUNTER — Other Ambulatory Visit (HOSPITAL_COMMUNITY): Payer: Self-pay

## 2021-05-24 ENCOUNTER — Ambulatory Visit: Payer: Medicare Other

## 2021-05-24 ENCOUNTER — Encounter: Payer: Self-pay | Admitting: Internal Medicine

## 2021-05-24 ENCOUNTER — Other Ambulatory Visit: Payer: Self-pay

## 2021-05-24 ENCOUNTER — Ambulatory Visit (INDEPENDENT_AMBULATORY_CARE_PROVIDER_SITE_OTHER): Payer: Medicare Other | Admitting: Internal Medicine

## 2021-05-24 VITALS — BP 128/74 | HR 86 | Ht 70.0 in | Wt 167.0 lb

## 2021-05-24 DIAGNOSIS — H6123 Impacted cerumen, bilateral: Secondary | ICD-10-CM

## 2021-05-24 DIAGNOSIS — M4802 Spinal stenosis, cervical region: Secondary | ICD-10-CM

## 2021-05-24 DIAGNOSIS — E7800 Pure hypercholesterolemia, unspecified: Secondary | ICD-10-CM

## 2021-05-24 DIAGNOSIS — I10 Essential (primary) hypertension: Secondary | ICD-10-CM

## 2021-05-24 DIAGNOSIS — H900 Conductive hearing loss, bilateral: Secondary | ICD-10-CM | POA: Diagnosis not present

## 2021-05-24 DIAGNOSIS — E559 Vitamin D deficiency, unspecified: Secondary | ICD-10-CM | POA: Diagnosis not present

## 2021-05-24 DIAGNOSIS — E1165 Type 2 diabetes mellitus with hyperglycemia: Secondary | ICD-10-CM | POA: Diagnosis not present

## 2021-05-24 NOTE — Progress Notes (Addendum)
Patient ID: Ronald Thompson, male   DOB: 04-23-1956, 65 y.o.   MRN: [id number]        Chief Complaint: follow up htn, dm, vit d deficiency, and bilateral ear wax with reduced hearing       HPI:  Ronald Thompson is a 65 y.o. male here overall doing ok, now taking Vit D.  Pt denies chest pain, increased sob or doe, wheezing, orthopnea, PND, increased LE swelling, palpitations, dizziness or syncope.  Pt denies polydipsia, polyuria, or new focal neuro s/s.  Followed per oncology closely.  Asks again for assurance he can call and have an antibiotic for bronchitis on demand.  Working on low chol diabetic diet.  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  Also has bialteral hearing reduced in the past wk, without pain, HA, or other new focal neuro s/s.   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 25 yrs ago-during cancer tx.   Past Surgical History:  Procedure Laterality Date   BACK SURGERY     '14- rupt. disc    CARDIAC CATHETERIZATION N/A 01/24/2016   Procedure: Right/Left Heart Cath and Coronary Angiography;  Surgeon: [name], MD;  Location: [facility];  Service: Cardiovascular;  Laterality: N/A;   COLONOSCOPY     EUS N/A 02/10/2014   Procedure: UPPER ENDOSCOPIC ULTRASOUND (EUS) LINEAR;  Surgeon: [name], MD;  Location: [facility];  Service: Endoscopy;  Laterality: N/A;   IR RADIOLOGIST EVAL & MGMT  02/11/2017   KNEE SURGERY Right    age 45 for Baker's cyst   NASAL SEPTUM SURGERY     Deviated septrum   TEE WITHOUT CARDIOVERSION N/A 04/16/2016   Procedure: TRANSESOPHAGEAL ECHOCARDIOGRAM (TEE);  Surgeon: [name], MD;  Location: [facility];  Service: Open Heart Surgery;  Laterality: N/A;   THORACOTOMY  1990   wedge biopsy of mediastinal mass   TRANSCATHETER AORTIC VALVE REPLACEMENT, TRANSFEMORAL N/A 04/16/2016   Procedure: TRANSCATHETER AORTIC VALVE REPLACEMENT, TRANSFEMORAL;  Surgeon: [name], MD;  Location: [facility];  Service: Open Heart Surgery;  Laterality: N/A;    reports that he has never smoked. He has never used  smokeless tobacco. He reports current alcohol use. He reports that he does not use drugs. family history includes Cancer in his maternal aunt and paternal grandfather; Dementia in his mother; Emphysema in his maternal aunt. Allergies  Allergen Reactions   Penicillins Hives    ENTIRE BODY Has patient had a PCN reaction causing immediate rash, facial/tongue/throat swelling, SOB or lightheadedness with hypotension: No Has patient had a PCN reaction causing severe rash involving mucus membranes or skin necrosis: No Has patient had a PCN reaction that required hospitalization * *  YES  * * Has patient had a PCN reaction occurring within the last 10 years: No If all of the above answers are "NO", then may proceed  with Cephalosporin use. *reaction occurred when he was 17   Current Outpatient Medications on File Prior to Visit  Medication Sig Dispense Refill   acetaminophen (TYLENOL) 500 MG tablet Take 1,000 mg by mouth every 6 (six) hours as needed for headache.     albuterol (VENTOLIN HFA) 108 (90 Base) MCG/ACT inhaler Inhale 1-2 puffs into the lungs every 6 (six) hours as needed for wheezing or shortness of breath. 1 each 4   Blood Pressure Monitoring (BLOOD PRESSURE CUFF) MISC Use as directed 1 each 0   calcium carbonate (TUMS - DOSED IN MG ELEMENTAL CALCIUM) 500 MG chewable tablet Chew 3 tablets by mouth 3 (three) times daily as needed for indigestion or heartburn.     capecitabine (XELODA) 500 MG tablet Take 3 tablets (1500 mg) in the morning and 2 tablets (1000 mg) in the evening. Take with food. Take on days 1 - 14 of each 28 day cycle. 70 tablet 0   clindamycin (CLEOCIN) 300 MG capsule Take 2 capsules by mouth one hour prior to dental appointment 6 capsule 2   docusate sodium (COLACE) 100 MG capsule Take 100 mg by mouth daily as needed for mild constipation.     gabapentin (NEURONTIN) 300 MG capsule TAKE ONE CAPSULE BY MOUTH THREE TIMES A DAY   START AFRER 100 MG INTIAL RX 90 capsule 1   glucose blood (ONETOUCH VERIO) test strip 1 each by Other route daily in the afternoon. Use as instructed to test blood sugar one time daily E11.65 100 each 3   ibuprofen (ADVIL,MOTRIN) 200 MG tablet Take 400 mg by mouth every 6 (six) hours as needed for headache or mild pain.      Lancet Device MISC Use as instructed to test blood sugar daily E11.65 1 each 2   losartan-hydrochlorothiazide (HYZAAR) 100-25 MG tablet TAKE ONE TABLET BY MOUTH DAILY 90 tablet 2   metFORMIN (GLUCOPHAGE-XR) 500 MG 24 hr tablet Take 1 tablet (500 mg total) by mouth daily with breakfast. 90 tablet 3   Octreotide Acetate (SANDOSTATIN IJ) Inject 1 each as directed every 30 (thirty) days.      ondansetron (ZOFRAN) 8 MG tablet Take 1 tablet (8  mg total) by mouth as directed. Take one tablet 30 minutes prior to each Temodar dose and every 8 hours as needed for nausea 30 tablet 1   OneTouch Delica Lancets 38V MISC Use as instructed to test blood sugar once daily E11.65 150 each 6   sildenafil (VIAGRA) 100 MG tablet TAKE 1/2 TO 1 TABLET BY MOUTH DAILY AS NEEDED FOR FOR ERECTILE DYSFUNCTION 5 tablet 10   simvastatin (ZOCOR) 20 MG tablet TAKE ONE TABLET BY MOUTH DAILY AT 6PM 90 tablet 3   temozolomide (TEMODAR) 180 MG capsule Take 2 capsules (360 mg total)  by mouth daily. Take on days 10-14 of each 28 day cycle. May take on an empty stomach or at bedtime to decrease nausea & vomiting. 10 capsule 0   Current Facility-Administered Medications on File Prior to Visit  Medication Dose Route Frequency Provider Last Rate Last Admin   heparin lock flush 100 unit/mL  500 Units Intravenous Once Ladell Pier, MD       octreotide (SANDOSTATIN LAR) 30 MG IM injection            sodium chloride flush (NS) 0.9 % injection 10 mL  10 mL Intracatheter PRN Ladell Pier, MD            ROS:  All others reviewed and negative.  Objective        PE:  BP 128/74 (BP Location: Left Arm, Patient Position: Sitting, Cuff Size: Normal)   Pulse 86   Ht 5\' 10"  (1.778 m)   Wt 167 lb (75.8 kg)   SpO2 95%   BMI 23.96 kg/m                 Constitutional: Pt appears in NAD               HENT: Head: NCAT.                Right Ear: External ear normal.                 Left Ear: External ear normal. Bilat tm's clear after bilat wax impactions resolved with irrigation, hearing improved               Eyes: . Pupils are equal, round, and reactive to light. Conjunctivae and EOM are normal               Nose: without d/c or deformity               Neck: Neck supple. Gross normal ROM               Cardiovascular: Normal rate and regular rhythm.                 Pulmonary/Chest: Effort normal and breath sounds without rales or wheezing.                Abd:  Soft, NT,  ND, + BS, no organomegaly               Neurological: Pt is alert. At baseline orientation, motor grossly intact               Skin: Skin is warm. No rashes, no other new lesions, LE edema - none               Psychiatric: Pt behavior is normal without agitation   Micro: none  Cardiac tracings I have personally interpreted today:  none  Pertinent Radiological findings (summarize): none   Lab Results  Component Value Date   WBC 5.1 05/14/2021   HGB 12.7 (L) 05/14/2021   HCT 36.9 (L) 05/14/2021   PLT 122 (L) 05/14/2021   GLUCOSE 100 (H) 05/14/2021   CHOL 136 10/19/2020   TRIG 60.0 10/19/2020   HDL 51.10 10/19/2020   LDLDIRECT 139.2 10/10/2008   LDLCALC 73 10/19/2020   ALT 14 05/14/2021   AST 26 05/14/2021   NA 137 05/14/2021   K 4.0 05/14/2021   CL 99 05/14/2021   CREATININE 1.42 (H) 05/14/2021   BUN 35 (H) 05/14/2021   CO2  32 05/14/2021   TSH 1.10 10/19/2020   PSA 1.88 10/19/2020   INR 1.30 04/16/2016   HGBA1C 5.7 (A) 01/18/2021   MICROALBUR 1.9 01/17/2020   Assessment/Plan:  Sair Faulcon is a 65 y.o. White or Caucasian [1] male with  has a past medical history of Baker's cyst, Gallstones, Heart murmur, Hypercholesteremia, Hypertension, Lesion of left lung, Liver metastasis ([facility]), Occlusion of left subclavian vein ([place]) (1990), Pericarditis, Pneumonia (1990), Primary neuroendocrine tumor of pancreas (02/14/2014), Pulmonary fibrosis ([facility]) (11/08/2015), S/P TAVR (transcatheter aortic valve replacement) (04/16/2016), Shortness of breath dyspnea, Testicular seminoma ([place]) (1990), and Transfusion history.  Type 2 diabetes mellitus with hyperglycemia, without long-term current use of insulin ([place]) . Lab Results  Component Value Date   HGBA1C 5.7 (A) 01/18/2021   Stable, pt to continue current medical treatment metformin   Vitamin D deficiency Last vitamin D Lab Results  Component Value Date   VD25OH 89.44 10/19/2020   Stable, cont oral replacement   Cervical spinal  stenosis Error - pt does not have this problem  HTN (hypertension) BP Readings from Last 3 Encounters:  05/24/21 128/74  05/14/21 130/78  04/18/21 124/72   Stable, pt to continue medical treatment hyzaar   Hyperlipidemia Lab Results  Component Value Date   LDLCALC 73 10/19/2020   Mild uncontrolled, goal ldl < 70,, pt to continue current statin zocor 20  - declines change for now   Bilateral hearing loss Improved with irrigation,  to f/u any worsening symptoms or concerns  Ceruminosis is noted.  Wax is removed by syringing and manual debridement. Instructions for home care to prevent wax buildup are given.  Followup: Return if symptoms worsen or fail to improve.  Cathlean Cower, MD 05/27/2021 2:23 PM [facility]

## 2021-05-24 NOTE — Patient Instructions (Signed)
Your ears were cleared of wax today  Please continue all other medications as before, and refills have been done if requested.  Please have the pharmacy call with any other refills you may need.  Please continue your efforts at being more active, low cholesterol diet  Please keep your appointments with your specialists as you may have planned

## 2021-05-24 NOTE — Progress Notes (Signed)
Patient consent obtained. Irrigation with water and peroxide performed. Full view of tympanic membranes after procedure.  Patient tolerated procedure well.   

## 2021-05-27 ENCOUNTER — Encounter: Payer: Self-pay | Admitting: Internal Medicine

## 2021-05-27 DIAGNOSIS — E559 Vitamin D deficiency, unspecified: Secondary | ICD-10-CM | POA: Insufficient documentation

## 2021-05-27 DIAGNOSIS — M4802 Spinal stenosis, cervical region: Secondary | ICD-10-CM | POA: Insufficient documentation

## 2021-05-27 NOTE — Assessment & Plan Note (Signed)
Last vitamin D Lab Results  Component Value Date   VD25OH 89.44 10/19/2020   Stable, cont oral replacement

## 2021-05-27 NOTE — Assessment & Plan Note (Signed)
Improved with irrigation,  to f/u any worsening symptoms or concerns  Ceruminosis is noted.  Wax is removed by syringing and manual debridement. Instructions for home care to prevent wax buildup are given.

## 2021-05-27 NOTE — Assessment & Plan Note (Signed)
Lab Results  Component Value Date   LDLCALC 73 10/19/2020   Mild uncontrolled, goal ldl < 70,, pt to continue current statin zocor 20  - declines change for now

## 2021-05-27 NOTE — Assessment & Plan Note (Addendum)
Error - pt does not have this problem

## 2021-05-27 NOTE — Assessment & Plan Note (Signed)
.   Lab Results  Component Value Date   HGBA1C 5.7 (A) 01/18/2021   Stable, pt to continue current medical treatment metformin

## 2021-05-27 NOTE — Assessment & Plan Note (Signed)
BP Readings from Last 3 Encounters:  05/24/21 128/74  05/14/21 130/78  04/18/21 124/72   Stable, pt to continue medical treatment hyzaar

## 2021-05-31 ENCOUNTER — Other Ambulatory Visit: Payer: Self-pay | Admitting: Internal Medicine

## 2021-05-31 NOTE — Telephone Encounter (Signed)
Please refill as per office routine med refill policy (all routine meds to be refilled for 3 mo or monthly (per pt preference) up to one year from last visit, then month to month grace period for 3 mo, then further med refills will have to be denied) ? ?

## 2021-06-06 ENCOUNTER — Other Ambulatory Visit: Payer: Self-pay

## 2021-06-06 NOTE — Progress Notes (Signed)
The proposed treatment discussed in conference is for discussion purpose only and is not a binding recommendation.  The patients have not been physically examined, or presented with their treatment options.  Therefore, final treatment plans cannot be decided.  

## 2021-06-07 ENCOUNTER — Other Ambulatory Visit: Payer: Self-pay | Admitting: Internal Medicine

## 2021-06-07 NOTE — Telephone Encounter (Signed)
Please refill as per office routine med refill policy (all routine meds to be refilled for 3 mo or monthly (per pt preference) up to one year from last visit, then month to month grace period for 3 mo, then further med refills will have to be denied) ? ?

## 2021-06-08 ENCOUNTER — Other Ambulatory Visit: Payer: Self-pay | Admitting: Internal Medicine

## 2021-06-12 ENCOUNTER — Ambulatory Visit: Payer: Medicare Other | Admitting: Oncology

## 2021-06-12 ENCOUNTER — Other Ambulatory Visit: Payer: Medicare Other

## 2021-06-12 ENCOUNTER — Ambulatory Visit: Payer: Medicare Other

## 2021-06-14 ENCOUNTER — Other Ambulatory Visit (HOSPITAL_COMMUNITY): Payer: Self-pay

## 2021-06-15 DIAGNOSIS — Z23 Encounter for immunization: Secondary | ICD-10-CM | POA: Diagnosis not present

## 2021-06-18 ENCOUNTER — Inpatient Hospital Stay (HOSPITAL_BASED_OUTPATIENT_CLINIC_OR_DEPARTMENT_OTHER): Payer: Medicare Other | Admitting: Oncology

## 2021-06-18 ENCOUNTER — Inpatient Hospital Stay: Payer: Medicare Other | Attending: Oncology

## 2021-06-18 ENCOUNTER — Other Ambulatory Visit: Payer: Self-pay

## 2021-06-18 ENCOUNTER — Inpatient Hospital Stay: Payer: Medicare Other

## 2021-06-18 ENCOUNTER — Telehealth: Payer: Self-pay | Admitting: Oncology

## 2021-06-18 ENCOUNTER — Encounter: Payer: Self-pay | Admitting: Cardiovascular Disease

## 2021-06-18 VITALS — BP 110/69 | HR 69 | Temp 97.8°F | Resp 20 | Ht 70.0 in | Wt 164.0 lb

## 2021-06-18 DIAGNOSIS — D3A8 Other benign neuroendocrine tumors: Secondary | ICD-10-CM | POA: Diagnosis not present

## 2021-06-18 DIAGNOSIS — C7B8 Other secondary neuroendocrine tumors: Secondary | ICD-10-CM | POA: Diagnosis not present

## 2021-06-18 DIAGNOSIS — C7A8 Other malignant neuroendocrine tumors: Secondary | ICD-10-CM | POA: Diagnosis not present

## 2021-06-18 LAB — CMP (CANCER CENTER ONLY)
ALT: 15 U/L (ref 0–44)
AST: 22 U/L (ref 15–41)
Albumin: 4.2 g/dL (ref 3.5–5.0)
Alkaline Phosphatase: 69 U/L (ref 38–126)
Anion gap: 6 (ref 5–15)
BUN: 29 mg/dL — ABNORMAL HIGH (ref 8–23)
CO2: 33 mmol/L — ABNORMAL HIGH (ref 22–32)
Calcium: 9.7 mg/dL (ref 8.9–10.3)
Chloride: 99 mmol/L (ref 98–111)
Creatinine: 1.31 mg/dL — ABNORMAL HIGH (ref 0.61–1.24)
GFR, Estimated: 60 mL/min — ABNORMAL LOW (ref >60)
Glucose, Bld: 103 mg/dL — ABNORMAL HIGH (ref 70–99)
Potassium: 4.1 mmol/L (ref 3.5–5.1)
Sodium: 138 mmol/L (ref 135–145)
Total Bilirubin: 1.2 mg/dL (ref 0.3–1.2)
Total Protein: 6.5 g/dL (ref 6.5–8.1)

## 2021-06-18 LAB — CBC (CANCER CENTER ONLY)
HCT: 35.3 % — ABNORMAL LOW (ref 39.0–52.0)
Hemoglobin: 12 g/dL — ABNORMAL LOW (ref 13.0–17.0)
MCH: 36.1 pg — ABNORMAL HIGH (ref 26.0–34.0)
MCHC: 34 g/dL (ref 30.0–36.0)
MCV: 106.3 fL — ABNORMAL HIGH (ref 80.0–100.0)
Platelet Count: 112 10*3/uL — ABNORMAL LOW (ref 150–400)
RBC: 3.32 MIL/uL — ABNORMAL LOW (ref 4.22–5.81)
RDW: 14.6 % (ref 11.5–15.5)
WBC Count: 4.8 10*3/uL (ref 4.0–10.5)
nRBC: 0 % (ref 0.0–0.2)

## 2021-06-18 MED ORDER — OCTREOTIDE ACETATE 30 MG IM KIT
30.0000 mg | PACK | Freq: Once | INTRAMUSCULAR | Status: AC
  Administered 2021-06-18: 30 mg via INTRAMUSCULAR

## 2021-06-18 NOTE — Progress Notes (Signed)
Cardiology Office Note   Date:  06/19/2021   ID:  Ronald Thompson, DOB 05/04/1956, MRN [id number]  PCP:  [name], MD  Cardiologist:   Mertie Moores, MD   Chief Complaint  Patient presents with   Aortic Stenosis   Problem list 1. Aortic stenosis - likely had a bicuspid AV  2. Essential hypertension 3. Permanent fibrosis 4. Hyperlipidemia 5. Metastatic seminoma - to his chest ,  S/p chemo and XRT  Took VP 16 - caused some pulmonary fibrosis  6. Neuroendocrine tumur - Pancreatic cancer with mets to Liver .    Notes from 2017:  Ronald Thompson is a 65 y.o. male who presents for evaluation of his aortic stenosis. He has a history of metastatic seminoma up to his chest. He received high-dose chemotherapy and XRT .   It also accompanied some of his heart and extended down to the diaphragm.  2 years ago he was diagnosed with a neuroendocrine tumor-likely to be pancreatic cancer.  Gets saldostatin monthly .  He has some metastases to his liver.   He sees [name]  Has been told that there is no cure, but the growth rate has been slowed dramaticlly .    Has had dyspnea for the past 25 years- since the seminoma was treated .  Has significant DOE but he is able to lift weights.  Does have DOE with walking up hills or climbing stairs.  Does ok on level ground .   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 He had TAVR on April 16, 2016.  He is overall done very well. He has a complex medical history including metastatic pancreatic neuroendocrine tumor.  Still has DOE. And fatigue  Has pulmonary fibrosis from the chemo ( VP 16, bleomycin, radiation therapy)   Still working through the neuroendocrine tumor PET scan in several week  No CP  Does not jog. Works  [employer] on a level surface without difficulities.   BP is typically well controlled.   Sept. 29, 2020  Doing well .    Chronically short of breath  Has pulmonary fibrosis from chemo   Nov. 9, 2021:  Ronald Thompson is doing well.   Has lost 10-12 lbs.  Was diagnosed with DM2 TAVR is working well No dyspnea.   Can walk several miles without difficulty  Had his TAVR 4 years ago .    Nov. 15, 2022: Seen with Wife, Webb Silversmith.  Ronald Thompson is doing well.    S/p TAVR 5 years ago .  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   Pericarditis    2nd to tumor   Pneumonia 1990   Primary neuroendocrine tumor of pancreas 02/14/2014   Stage IV with multiple mets to liver   Pulmonary fibrosis ([place]) 11/08/2015   S/P TAVR (transcatheter aortic valve replacement) 04/16/2016   26  mm Edwards Sapien 3 transcatheter heart valve placed via percutaneous right transfemoral approach    Shortness of breath dyspnea    with exertion and when tired   Testicular seminoma (Ronald Thompson) 1990   with metatstatic spread - good response to therapy-radiation and chemotherapy   Transfusion history    hx. 25 yrs ago-during cancer tx.    Past Surgical History:  Procedure Laterality Date   BACK SURGERY     '14- rupt. disc    CARDIAC CATHETERIZATION N/A 01/24/2016   Procedure: Right/Left Heart Cath and Coronary Angiography;  Surgeon: [name], MD;  Location: [facility];  Service: Cardiovascular;  Laterality: N/A;   COLONOSCOPY     EUS N/A 02/10/2014   Procedure: UPPER ENDOSCOPIC ULTRASOUND (EUS) LINEAR;  Surgeon: [name], MD;  Location: [facility];  Service: Endoscopy;  Laterality: N/A;   IR  RADIOLOGIST EVAL & MGMT  02/11/2017   KNEE SURGERY Right    age 65 for Baker's cyst   NASAL SEPTUM SURGERY     Deviated septrum   TEE WITHOUT CARDIOVERSION N/A 04/16/2016   Procedure: TRANSESOPHAGEAL ECHOCARDIOGRAM (TEE);  Surgeon: [name], MD;  Location: [facility];  Service: Open Heart Surgery;  Laterality: N/A;   THORACOTOMY  1990   wedge biopsy of mediastinal mass   TRANSCATHETER AORTIC VALVE REPLACEMENT, TRANSFEMORAL N/A 04/16/2016   Procedure: TRANSCATHETER AORTIC VALVE REPLACEMENT, TRANSFEMORAL;  Surgeon: [name], MD;  Location: [facility];  Service: Open Heart Surgery;  Laterality: N/A;     Current Outpatient Medications  Medication Sig Dispense Refill   acetaminophen (TYLENOL) 500 MG tablet Take 1,000 mg by mouth every 6 (six) hours as needed for headache.     albuterol (VENTOLIN HFA) 108 (90 Base) MCG/ACT inhaler Inhale 1-2 puffs into the lungs every 6 (six) hours as needed for wheezing or shortness of breath. 1 each 4   Blood Pressure Monitoring (BLOOD PRESSURE CUFF) MISC Use as directed 1 each 0   calcium carbonate (TUMS - DOSED IN MG ELEMENTAL CALCIUM) 500 MG chewable tablet Chew 3 tablets by mouth 3 (three) times daily as needed for indigestion or heartburn.     capecitabine (XELODA) 500 MG tablet Take 3 tablets (1500 mg) in the morning and 2 tablets (1000 mg) in the evening. Take with food. Take on days 1 - 14 of each 28 day cycle. 70 tablet 0   clindamycin (CLEOCIN) 300 MG capsule Take 2 capsules by mouth one hour prior to dental appointment 6 capsule 2   docusate sodium (COLACE) 100 MG capsule Take 100 mg by mouth daily as needed for mild constipation.     gabapentin (NEURONTIN) 300 MG capsule TAKE ONE CAPSULE BY MOUTH THREE TIMES A DAY   START AFRER 100 MG INTIAL RX 90 capsule 1   glucose blood (ONETOUCH VERIO) test strip 1 each by Other route daily in the afternoon. Use as instructed to test blood sugar one time daily E11.65 100 each 3   ibuprofen (ADVIL,MOTRIN) 200 MG  tablet Take 400 mg by mouth every 6 (six) hours as needed for headache or mild pain.      Lancet Device MISC Use as instructed to test blood sugar daily E11.65 1 each 2   losartan-hydrochlorothiazide (HYZAAR) 100-25 MG tablet TAKE ONE TABLET BY MOUTH DAILY 90 tablet 2   metFORMIN (GLUCOPHAGE-XR) 500 MG 24 hr tablet Take 1 tablet (500 mg total) by mouth daily with breakfast. 90 tablet 3   Octreotide Acetate (SANDOSTATIN IJ) Inject 1  each as directed every 30 (thirty) days.      ondansetron (ZOFRAN) 8 MG tablet Take 1 tablet (8 mg total) by mouth as directed. Take one tablet 30 minutes prior to each Temodar dose and every 8 hours as needed for nausea 30 tablet 1   OneTouch Delica Lancets 58I MISC Use as instructed to test blood sugar once daily E11.65 150 each 6   sildenafil (VIAGRA) 100 MG tablet TAKE ONE HALF TO ONE TABLET BY MOUTH DAILY AS NEEDED FOR FOR ERECTILE DYSFUNCTION 5 tablet 10   simvastatin (ZOCOR) 20 MG tablet TAKE ONE TABLET BY MOUTH DAILY AT 8PM 90 tablet 1   temozolomide (TEMODAR) 180 MG capsule Take 2 capsules (360 mg total) by mouth daily. Take on days 10-14 of each 28 day cycle. May take on an empty stomach or at bedtime to decrease nausea & vomiting. 10 capsule 0   No current facility-administered medications for this visit.   Facility-Administered Medications Ordered in Other Visits  Medication Dose Route Frequency Provider Last Rate Last Admin   heparin lock flush 100 unit/mL  500 Units Intravenous Once Ladell Pier, MD       octreotide (SANDOSTATIN LAR) 30 MG IM injection            sodium chloride flush (NS) 0.9 % injection 10 mL  10 mL Intracatheter PRN Ladell Pier, MD        Allergies:   Penicillins    Social History:  The patient  reports that he has never smoked. He has never used smokeless tobacco. He reports current alcohol use. He reports that he does not use drugs.   Family History:  The patient's family history includes Cancer in his maternal aunt and  paternal grandfather; Dementia in his mother; Emphysema in his maternal aunt.    ROS:  Please see the history of present illness.   Physical Exam: Blood pressure 130/76, pulse 82, height 5\' 10"  (1.778 m), weight 165 lb 9.6 oz (75.1 kg), SpO2 98 %.  GEN:  middle age male,   HEENT: Normal NECK: No JVD; No carotid bruits LYMPHATICS: No lymphadenopathy CARDIAC: RRR  soft diastolic murmur  RESPIRATORY:  Clear to auscultation without rales, wheezing or rhonchi  ABDOMEN: Soft, non-tender, non-distended MUSCULOSKELETAL:  No edema; No deformity  SKIN: Warm and dry NEUROLOGIC:  Alert and oriented x 3   EKG:     Recent Labs: 10/19/2020: TSH 1.10 06/18/2021: ALT 15; BUN 29; Creatinine 1.31; Hemoglobin 12.0; Platelet Count 112; Potassium 4.1; Sodium 138    Lipid Panel    Component Value Date/Time   CHOL 136 10/19/2020 1201   TRIG 60.0 10/19/2020 1201   HDL 51.10 10/19/2020 1201   CHOLHDL 3 10/19/2020 1201   VLDL 12.0 10/19/2020 1201   LDLCALC 73 10/19/2020 1201   LDLDIRECT 139.2 10/10/2008 0800      Wt Readings from Last 3 Encounters:  06/19/21 165 lb 9.6 oz (75.1 kg)  06/18/21 164 lb (74.4 kg)  05/24/21 167 lb (75.8 kg)      Other studies Reviewed: Additional studies/ records that were reviewed today include: . Review of the above records demonstrates:    ASSESSMENT AND PLAN:  1.  Aortic stenosis:   s/p TAVR.   Valve sounds great ,  will get an echo in 1 year  with an office visit several weeks later.     2.  Hypertension:   BP is well controlled.  Cont meds.     3.  Pulmonic insufficiency:  he has mod - severe pulmonary HTN with assoicated pulmonic insufficiency.  Echo next year with office visit several weeks later.    Current medicines are reviewed at length with the patient today.  The patient does not have concerns regarding medicines.  The following changes have been made:  no change  Labs/ tests ordered today include:   Orders Placed This Encounter   Procedures   ECHOCARDIOGRAM COMPLETE     Mertie Moores, MD  06/19/2021 11:18 AM    [address], [place], [place]  [address] Phone: [phone]; Fax: [phone]

## 2021-06-18 NOTE — Progress Notes (Signed)
[facility] OFFICE PROGRESS NOTE   Diagnosis: Neuroendocrine carcinoma  INTERVAL HISTORY:   Ronald Thompson returns as scheduled.  He completed another cycle of capecitabine/temozolomide beginning 05/16/2021.  No mouth sores, nausea, or diarrhea.  He reports malaise beginning during the second week of capecitabine.  The malaise persisted until last week.  He would like to delay the next cycle of chemotherapy for a week and consider a dose reduction.  No hand or foot pain. Objective:  Vital signs in last 24 hours:  Blood pressure 110/69, pulse 69, temperature 97.8 F (36.6 C), temperature source Oral, resp. rate 20, height 5\' 10"  (1.778 m), weight 164 lb (74.4 kg), SpO2 100 %.    HEENT: No thrush or ulcers Resp: Lungs with decreased breath sounds at the left chest, no respiratory distress Cardio: Regular rate and rhythm GI: No splenomegaly.  Liver edge in the right mid abdomen?  Nontender Vascular: No leg edema  Skin: Palms without erythema  Lab Results:  Lab Results  Component Value Date   WBC 4.8 06/18/2021   HGB 12.0 (L) 06/18/2021   HCT 35.3 (L) 06/18/2021   MCV 106.3 (H) 06/18/2021   PLT 112 (L) 06/18/2021   NEUTROABS 3.7 05/14/2021    CMP  Lab Results  Component Value Date   NA 137 05/14/2021   K 4.0 05/14/2021   CL 99 05/14/2021   CO2 32 05/14/2021   GLUCOSE 100 (H) 05/14/2021   BUN 35 (H) 05/14/2021   CREATININE 1.42 (H) 05/14/2021   CALCIUM 9.9 05/14/2021   PROT 7.0 05/14/2021   ALBUMIN 4.4 05/14/2021   AST 26 05/14/2021   ALT 14 05/14/2021   ALKPHOS 66 05/14/2021   BILITOT 1.4 (H) 05/14/2021   GFRNONAA 54 (L) 05/14/2021   GFRAA >60 01/19/2020    No results found for: CEA1, CEA, [id number], CA125  Lab Results  Component Value Date   INR 1.30 04/16/2016   LABPROT 16.3 (H) 04/16/2016    Imaging:  No results found.  Medications: I have reviewed the patient's current medications.   Assessment/Plan: Pancreatic neuroendocrine tumor, WHO  grade 2, pancreatic head mass and small peripancreatic/celiac nodes on a CT 02/04/2014 EUS revealed evidence of multiple liver metastases-status post an FNA biopsy of a left liver lesion 02/10/2014 confirming a neuroendocrine tumor   Octreotide scan 04/01/2014 with multiple foci of metastatic neuroendocrine tumor in the liver Monthly Sandostatin started 03/16/2014 Restaging CT 07/04/2014 with resolution of a previously noted pancreas head lesion and probable progression of liver metastases Restaging CT 10/31/2014-stable liver lesions, no evidence of a pancreas mass, stable. Restaging CT 02/28/2015-stable hepatic metastases, stable pancreas lesion unchanged Restaging CT 08/30/2015-stable pancreas mass, slight enlargement of hepatic metastases Monthly Sandostatin continued Restaging CTs 01/17/2016 revealed enlargement of liver lesions, no new lesions, enlargement of the pancreas head mass Gallium DOTATATE scan 10/01/2016 confirmed uptake in the liver metastases, pancreas primary, peripancreatic adenopathy, and left iliac bone Cycle 1 Lutathera 03/05/2017 Cycle 2 Lutathera 04/30/2017 Cycle 3 Lutathera 07/02/2017 Cycle 4 Lutathera 08/27/2017 Netspot scan 09/29/2017-decreased radiotracer activity within the pancreas head, peripancreatic lymph node, and solitary skeletal lesion.  Liver lesions have increased in size with a decrease in radiotracer activity Monthly Sandostatin continued Netspot scan 05/20/2018-stable to decreased size of liver lesions, most have decreased SUV, similar uptake in the pancreas lesion, previously noted peripancreatic lymph node has resolved him a mild decrease in uptake associated with a left iliac metastasis, no evidence of disease progression Monthly Sandostatin continued Netspot scan 01/29/2019- overall stable  to improved, 1 lesion left hepatic lobe has increased tracer activity-unchanged in size, majority of hepatic lesions have reduced activity compared to the pretreatment  scan, decreased activity left iliac bone lesion, stable activity in the head of the pancreas lesion, no new lesions Monthly Sandostatin continued Netspot on September 24, 2019-increase in size of 3 liver lesions with associated stable radiotracer activity, no new lesions, stable small left iliac lesion Cycle 1 salvage Lutathera 10/27/2019 Cycle 2 salvage Lutathera 12/22/2019 Netspot on 02/15/2020-decrease in radiotracer activity in the liver metastases with a decrease in size of several lesions, no new lesions, no progressive disease in the bowel or mesentery, decreased activity in the left iliac bone lesion Monthly Sandostatin continued  Netspot 10/13/2020-decrease in radiotracer activity in the left and right liver, decreased size of liver lesions, one lesion in the superior right liver has increased in size and has no radiotracer activity, no evidence of metastatic disease outside the liver PET 11/15/2020- moderate hypermetabolic activity associated with several enlarging liver lesions that had limited activity on the dotatate PET, a lesion in the left lateral hepatic lobe with mild persistent dotatate activity has mild peripheral FDG activity and is decreased in size from a dotatate PET 1 year ago.  No hypermetabolic activity in the pancreas or bowel.  No hypermetabolic activity in the previous dotatate avid left iliac lesion Monthly Sandostatin continued Cycle 1 capecitabine/temozolomide 01/22/2021 (capecitabine twice daily days 1 through 14 of each 28-day cycle/temozolomide days 10 through 14) Cycle 2 capecitabine/temozolomide 02/19/2021 Cycle 3 capecitabine/temozolomide 03/19/2021 Cycle 4 capecitabine/temozolomide 04/17/2021 PET 05/11/2021-slight increase in size of several FDG avid liver lesions, no new hepatic lesions, no evidence of metastatic disease to the chest or bones Cycle 5 capecitabine/temozolomide 05/16/2021 Cycle 6 capecitabine/temozolomide 06/25/2021    Chest Seminoma in 1990 treated with  BEP and chest radiation at [facility] 3. 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 chronic exertional dyspnea following treatment for the seminoma   5. aortic stenosis on an echocardiogram December 2011, severe aortic stenosis on echocardiogram 12/06/2015, status post TAVR on 04/16/2016 6. lumbar disc surgery 2014   7. acute abdominal pain 02/04/2014-resolved   8.  Diabetes 9.  COVID-19 infection July 2022     Disposition: Mr. Treadway appears stable.  He reports increased malaise during and following chemotherapy.  Chemotherapy will be delayed 1 week with this cycle.  The capecitabine will be dose reduced.  He will return for an office and lab visit on 07/23/2021.  Mr. Parham will receive Sandostatin today and on 07/23/2021.  His case was presented at the GI tumor conference.  The consensus recommendation was to proceed with capecitabine/temozolomide for 2-3 additional cycles and then schedule another restaging evaluation.    Betsy Coder, MD  06/18/2021  9:14 AM

## 2021-06-18 NOTE — Patient Instructions (Signed)
Octreotide injection solution What is this medication? OCTREOTIDE (ok TREE oh tide) is used to reduce blood levels of growth hormone in patients with a condition called acromegaly. This medicine also reduces flushing and watery diarrhea caused by certain types of cancer. This medicine may be used for other purposes; ask your health care provider or pharmacist if you have questions. COMMON BRAND NAME(S): Bynfezia, Sandostatin What should I tell my care team before I take this medication? They need to know if you have any of these conditions: diabetes gallbladder disease kidney disease liver disease thyroid disease an unusual or allergic reaction to octreotide, other medicines, foods, dyes, or preservatives pregnant or trying to get pregnant breast-feeding How should I use this medication? This medicine is for injection under the skin or into a vein (only in emergency situations). It is usually given by a health care professional in a hospital or clinic setting. If you get this medicine at home, you will be taught how to prepare and give this medicine. Allow the injection solution to come to room temperature before use. Do not warm it artificially. Use exactly as directed. Take your medicine at regular intervals. Do not take your medicine more often than directed. It is important that you put your used needles and syringes in a special sharps container. Do not put them in a trash can. If you do not have a sharps container, call your pharmacist or healthcare provider to get one. Talk to your pediatrician regarding the use of this medicine in children. Special care may be needed. Overdosage: If you think you have taken too much of this medicine contact a poison control center or emergency room at once. NOTE: This medicine is only for you. Do not share this medicine with others. What if I miss a dose? If you miss a dose, take it as soon as you can. If it is almost time for your next dose, take only  that dose. Do not take double or extra doses. What may interact with this medication? bromocriptine certain medicines for blood pressure, heart disease, irregular heartbeat cyclosporine diuretics medicines for diabetes, including insulin quinidine This list may not describe all possible interactions. Give your health care provider a list of all the medicines, herbs, non-prescription drugs, or dietary supplements you use. Also tell them if you smoke, drink alcohol, or use illegal drugs. Some items may interact with your medicine. What should I watch for while using this medication? Visit your doctor or health care professional for regular checks on your progress. To help reduce irritation at the injection site, use a different site for each injection and make sure the solution is at room temperature before use. This medicine may cause decreases in blood sugar. Signs of low blood sugar include chills, cool, pale skin or cold sweats, drowsiness, extreme hunger, fast heartbeat, headache, nausea, nervousness or anxiety, shakiness, trembling, unsteadiness, tiredness, or weakness. Contact your doctor or health care professional right away if you experience any of these symptoms. This medicine may increase blood sugar. Ask your healthcare provider if changes in diet or medicines are needed if you have diabetes. This medicine may cause a decrease in vitamin B12. You should make sure that you get enough vitamin B12 while you are taking this medicine. Discuss the foods you eat and the vitamins you take with your health care professional. What side effects may I notice from receiving this medication? Side effects that you should report to your doctor or health care professional as soon as   possible: allergic reactions like skin rash, itching or hives, swelling of the face, lips, or tongue fast, slow, or irregular heartbeat right upper belly pain severe stomach pain signs and symptoms of high blood sugar such  as being more thirsty or hungry or having to urinate more than normal. You may also feel very tired or have blurry vision. signs and symptoms of low blood sugar such as feeling anxious; confusion; dizziness; increased hunger; unusually weak or tired; increased sweating; shakiness; cold, clammy skin; irritable; headache; blurred vision; fast heartbeat; loss of consciousness unusually weak or tired Side effects that usually do not require medical attention (report to your doctor or health care professional if they continue or are bothersome): diarrhea dizziness gas headache nausea, vomiting pain, redness, or irritation at site where injected upset stomach This list may not describe all possible side effects. Call your doctor for medical advice about side effects. You may report side effects to FDA at 1-800-FDA-1088. Where should I keep my medication? Keep out of the reach of children. Store in a refrigerator between 2 and 8 degrees C (36 and 46 degrees F). Protect from light. Allow to come to room temperature naturally. Do not use artificial heat. If protected from light, the injection may be stored at room temperature between 20 and 30 degrees C (70 and 86 degrees F) for 14 days. After the initial use, throw away any unused portion of a multiple dose vial after 14 days. Throw away unused portions of the ampules after use. NOTE: This sheet is a summary. It may not cover all possible information. If you have questions about this medicine, talk to your doctor, pharmacist, or health care provider.  2022 Elsevier/Gold Standard (2019-02-18 00:00:00)  

## 2021-06-18 NOTE — Telephone Encounter (Signed)
Scheduled appt per 11/14 los - called patient and left message for patient with appt date and time

## 2021-06-19 ENCOUNTER — Other Ambulatory Visit: Payer: Self-pay | Admitting: Oncology

## 2021-06-19 ENCOUNTER — Other Ambulatory Visit: Payer: Self-pay | Admitting: *Deleted

## 2021-06-19 ENCOUNTER — Other Ambulatory Visit (HOSPITAL_COMMUNITY): Payer: Self-pay

## 2021-06-19 ENCOUNTER — Encounter: Payer: Self-pay | Admitting: Cardiovascular Disease

## 2021-06-19 ENCOUNTER — Ambulatory Visit (INDEPENDENT_AMBULATORY_CARE_PROVIDER_SITE_OTHER): Payer: Medicare Other | Admitting: Cardiovascular Disease

## 2021-06-19 VITALS — BP 130/76 | HR 82 | Ht 70.0 in | Wt 165.6 lb

## 2021-06-19 DIAGNOSIS — J8410 Pulmonary fibrosis, unspecified: Secondary | ICD-10-CM

## 2021-06-19 DIAGNOSIS — Z952 Presence of prosthetic heart valve: Secondary | ICD-10-CM | POA: Diagnosis not present

## 2021-06-19 DIAGNOSIS — D3A8 Other benign neuroendocrine tumors: Secondary | ICD-10-CM

## 2021-06-19 LAB — CHROMOGRANIN A: Chromogranin A (ng/mL): 17690 ng/mL — ABNORMAL HIGH (ref 0.0–101.8)

## 2021-06-19 MED ORDER — CAPECITABINE 500 MG PO TABS: ORAL_TABLET | ORAL | 0 refills | Status: DC

## 2021-06-19 MED ORDER — CAPECITABINE 500 MG PO TABS
ORAL_TABLET | ORAL | 0 refills | Status: DC
  Filled 2021-06-19: qty 42, 28d supply, fill #0

## 2021-06-19 MED ORDER — TEMOZOLOMIDE 180 MG PO CAPS
360.0000 mg | ORAL_CAPSULE | Freq: Every day | ORAL | 0 refills | Status: DC
  Filled 2021-06-19: qty 10, 5d supply, fill #0

## 2021-06-19 NOTE — Progress Notes (Signed)
Re-sent script to [facility] per pharmacy requestt

## 2021-06-19 NOTE — Patient Instructions (Signed)
Medication Instructions:  *If you need a refill on your cardiac medications before your next appointment, please call your pharmacy*  Lab Work: If you have labs (blood work) drawn today and your tests are completely normal, you will receive your results only by: [place] (if you have MyChart) OR A paper copy in the mail If you have any lab test that is abnormal or we need to change your treatment, we will call you to review the results.  Testing/Procedures: Your physician has requested that you have an echocardiogram in 1 year. Echocardiography is a painless test that uses sound waves to create images of your heart. It provides your doctor with information about the size and shape of your heart and how well your heart's chambers and valves are working. This procedure takes approximately one hour. There are no restrictions for this procedure.  Follow-Up: At [facility], you and your health needs are our priority.  As part of our continuing mission to provide you with exceptional heart care, we have created designated Provider Care Teams.  These Care Teams include your primary Cardiologist (physician) and Advanced Practice Providers (APPs -  Physician Assistants and Nurse Practitioners) who all work together to provide you with the care you need, when you need it.  We recommend signing up for the patient portal called "MyChart".  Sign up information is provided on this After Visit Summary.  MyChart is used to connect with patients for Virtual Visits (Telemedicine).  Patients are able to view lab/test results, encounter notes, upcoming appointments, etc.  Non-urgent messages can be sent to your provider as well.   To learn more about what you can do with MyChart, go to [email or web address].    Your next appointment:   1 year(s)  The format for your next appointment:   In Person  Provider:   Mertie Moores, MD

## 2021-06-21 ENCOUNTER — Other Ambulatory Visit (HOSPITAL_COMMUNITY): Payer: Self-pay

## 2021-06-22 ENCOUNTER — Other Ambulatory Visit (HOSPITAL_COMMUNITY): Payer: Self-pay

## 2021-07-09 ENCOUNTER — Other Ambulatory Visit: Payer: Self-pay

## 2021-07-09 ENCOUNTER — Ambulatory Visit (INDEPENDENT_AMBULATORY_CARE_PROVIDER_SITE_OTHER): Payer: Medicare Other

## 2021-07-09 DIAGNOSIS — Z0000 Encounter for general adult medical examination without abnormal findings: Secondary | ICD-10-CM | POA: Diagnosis not present

## 2021-07-09 NOTE — Progress Notes (Signed)
I connected with Bonney Leitz today by telephone and verified that I am speaking with the correct person using two identifiers. Location patient: home Location provider: work Persons participating in the virtual visit: patient, provider.   I discussed the limitations, risks, security and privacy concerns of performing an evaluation and management service by telephone and the availability of in person appointments. I also discussed with the patient that there may be a patient responsible charge related to this service. The patient expressed understanding and verbally consented to this telephonic visit.    Interactive audio and video telecommunications were attempted between this provider and patient, however failed, due to patient having technical difficulties OR patient did not have access to video capability.  We continued and completed visit with audio only.  Some vital signs may be absent or patient reported.   Time Spent with patient on telephone encounter: 40 minutes  Subjective:   Ronald Thompson is a 65 y.o. male who presents for Medicare Annual/Subsequent preventive examination.  Review of Systems     Cardiac Risk Factors include: advanced age (>88men, >10 women);diabetes mellitus;dyslipidemia;hypertension;male gender     Objective:    Today's Vitals   07/09/21 1044  PainSc: 0-No pain   There is no height or weight on file to calculate BMI.  Advanced Directives 07/09/2021 05/14/2021 02/19/2021 11/19/2020 01/27/2020 01/19/2020 09/29/2019  Does Patient Have a Medical Advance Directive? Yes Yes Yes Yes Yes Yes Yes  Type of Advance Directive Living will;Healthcare Power of [place];Living will [place];Living will - [place];Living will [place];Living will  Does patient want to make changes to medical advance directive? No - Patient declined No - Patient declined No - Patient declined - No - Patient  declined No - Patient declined No - Patient declined  Copy of [place] in Chart? No - copy requested - No - copy requested - No - copy requested No - copy requested No - copy requested  Would patient like information on creating a medical advance directive? - - - - - - -  Pre-existing out of facility DNR order (yellow form or pink MOST form) - - - - - - -    Current Medications (verified) Outpatient Encounter Medications as of 07/09/2021  Medication Sig   acetaminophen (TYLENOL) 500 MG tablet Take 1,000 mg by mouth every 6 (six) hours as needed for headache.   albuterol (VENTOLIN HFA) 108 (90 Base) MCG/ACT inhaler Inhale 1-2 puffs into the lungs every 6 (six) hours as needed for wheezing or shortness of breath.   Blood Pressure Monitoring (BLOOD PRESSURE CUFF) MISC Use as directed   calcium carbonate (TUMS - DOSED IN MG ELEMENTAL CALCIUM) 500 MG chewable tablet Chew 3 tablets by mouth 3 (three) times daily as needed for indigestion or heartburn.   capecitabine (XELODA) 500 MG tablet Take #2 tablets (1000 mg) in am and #1 tablet (500 mg) in pm daily on days 1-14 of each 28 day cycle. Start on 06/25/21   clindamycin (CLEOCIN) 300 MG capsule Take 2 capsules by mouth one hour prior to dental appointment   docusate sodium (COLACE) 100 MG capsule Take 100 mg by mouth daily as needed for mild constipation.   gabapentin (NEURONTIN) 300 MG capsule TAKE ONE CAPSULE BY MOUTH THREE TIMES A DAY   START AFRER 100 MG INTIAL RX   glucose blood (ONETOUCH VERIO) test strip 1 each by Other route daily in the afternoon.  Use as instructed to test blood sugar one time daily E11.65   ibuprofen (ADVIL,MOTRIN) 200 MG tablet Take 400 mg by mouth every 6 (six) hours as needed for headache or mild pain.    Lancet Device MISC Use as instructed to test blood sugar daily E11.65   losartan-hydrochlorothiazide (HYZAAR) 100-25 MG tablet TAKE ONE TABLET BY MOUTH DAILY   metFORMIN (GLUCOPHAGE-XR) 500 MG 24 hr  tablet Take 1 tablet (500 mg total) by mouth daily with breakfast.   Octreotide Acetate (SANDOSTATIN IJ) Inject 1 each as directed every 30 (thirty) days.    ondansetron (ZOFRAN) 8 MG tablet Take 1 tablet (8 mg total) by mouth as directed. Take one tablet 30 minutes prior to each Temodar dose and every 8 hours as needed for nausea   OneTouch Delica Lancets 26Z MISC Use as instructed to test blood sugar once daily E11.65   sildenafil (VIAGRA) 100 MG tablet TAKE ONE HALF TO ONE TABLET BY MOUTH DAILY AS NEEDED FOR FOR ERECTILE DYSFUNCTION   simvastatin (ZOCOR) 20 MG tablet TAKE ONE TABLET BY MOUTH DAILY AT 8PM   temozolomide (TEMODAR) 180 MG capsule Take 2 capsules (360 mg total) by mouth daily. Take on days 10-14 of each 28 day cycle. May take on an empty stomach or at bedtime to decrease nausea & vomiting.   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 25 yrs ago-during cancer tx.   Past Surgical History:  Procedure Laterality Date   BACK SURGERY     '14- rupt. disc    CARDIAC CATHETERIZATION N/A 01/24/2016   Procedure: Right/Left Heart Cath and Coronary Angiography;  Surgeon: [name], MD;  Location: [facility];  Service: Cardiovascular;  Laterality: N/A;   COLONOSCOPY     EUS N/A 02/10/2014   Procedure: UPPER ENDOSCOPIC ULTRASOUND (EUS) LINEAR;  Surgeon: [name], MD;  Location: [facility];  Service: Endoscopy;  Laterality: N/A;   IR RADIOLOGIST EVAL & MGMT  02/11/2017   KNEE SURGERY Right    age 18 for Baker's cyst   NASAL SEPTUM SURGERY     Deviated septrum   TEE WITHOUT CARDIOVERSION N/A 04/16/2016   Procedure: TRANSESOPHAGEAL ECHOCARDIOGRAM (TEE);  Surgeon: [name], MD;  Location: [facility];  Service: Open Heart Surgery;  Laterality: N/A;   THORACOTOMY  1990   wedge biopsy of mediastinal mass   TRANSCATHETER AORTIC VALVE REPLACEMENT, TRANSFEMORAL N/A 04/16/2016   Procedure: TRANSCATHETER AORTIC VALVE REPLACEMENT, TRANSFEMORAL;  Surgeon: [name], MD;  Location: [facility];  Service: Open Heart Surgery;  Laterality: N/A;   Family History  Problem Relation Age of Onset   Dementia Mother    Cancer Paternal Grandfather        esophagus   Cancer Maternal Aunt        colon   Emphysema Maternal Aunt    Social History   Socioeconomic History   Marital status: Married    Spouse name: Not on file   Number of children: 3   Years of education: 16   Highest education level: Not on file  Occupational History   Occupation: Administrator, sports: [employer]  Tobacco Use   Smoking status: Never   Smokeless tobacco: Never  Vaping Use   Vaping Use: Never used  Substance and Sexual Activity   Alcohol use: Yes    Comment: rare- social   Drug use: No   Sexual activity: Yes    Partners: Female  Other  Topics Concern   Not on file  Social History Narrative   [name]. 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 2 sons: 1979, 1985- married with 2 daughters 1 in route: Oldest in [place], younger [place], [place]. 1 daughter: 12 [place], married-1 granddaughter.Customer service manager- Nurse, mental health. SO- good health; marriage in good health   Enjoys watching movies, works out at home gym   Social Determinants of Radio broadcast assistant Strain: Low Risk    Difficulty of Paying Living Expenses: Not hard at all  Food Insecurity: No Food Insecurity   Worried About Charity fundraiser in the Last Year: Never true   Arboriculturist in the Last Year: Never true  Transportation Needs: No Transportation Needs   Lack of Transportation (Medical): No   Lack of Transportation (Non-Medical): No  Physical Activity: Sufficiently Active   Days of Exercise per Week: 7 days   Minutes of Exercise per Session: 30 min  Stress: No Stress Concern Present   Feeling of Stress : Not at all  Social Connections: Socially Integrated   Frequency of Communication with Friends and Family: More than three times a week   Frequency of Social Gatherings with Friends and Family: More than three times a week   Attends Religious Services: 1 to 4 times per year   Active Member of [employer] or Organizations: Yes   Attends Archivist Meetings: 1 to 4 times per year   Marital Status: Married    Tobacco Counseling Counseling given: Not Answered   Clinical Intake:  Pre-visit preparation completed: No  Pain : No/denies pain Pain Score: 0-No pain     Nutritional Risks: None Diabetes: No  How often do you need to have someone help you when you read instructions, pamphlets, or other written materials from your doctor or pharmacy?: 1 - Never What is the last grade level you completed in school?: Bachelor's Degree  Diabetic? no  Interpreter Needed?: No  Information entered by :: Lisette Abu, LPN   Activities of Daily Living In your  present state of health, do you have any difficulty performing the following activities: 07/09/2021 10/19/2020  Hearing? N N  Vision? N N  Difficulty concentrating or making decisions? N N  Walking or climbing stairs?  N N  Dressing or bathing? N N  Doing errands, shopping? N N  Preparing Food and eating ? N -  Using the Toilet? N -  In the past six months, have you accidently leaked urine? N -  Do you have problems with loss of bowel control? N -  Managing your Medications? N -  Managing your Finances? N -  Housekeeping or managing your Housekeeping? N -  Some recent data might be hidden    Patient Care Team: [name], MD as PCP - General (Internal Medicine) Nahser, Wonda Cheng, MD as PCP - Cardiology (Cardiology) Ladell Pier, MD as Consulting Physician (Oncology) [name], MD as Consulting Physician (Cardiology) Deneise Lever, MD as Consulting Physician (Pulmonary Disease)  Indicate any recent Medical Services you may have received from other than [facility] providers in the past year (date may be approximate).     Assessment:   This is a routine wellness examination for Ronald Thompson.  Hearing/Vision screen Hearing Screening - Comments:: Patient denied any hearing difficulty.   No hearing aids.  Vision Screening - Comments:: Patient wears corrective glasses/contacts.  Eye exam done annually: [facility]  Dietary issues and exercise activities discussed: Current Exercise Habits: Home exercise routine, Type of exercise: walking;strength training/weights, Time (Minutes): 30, Frequency (Times/Week): 7, Weekly Exercise (Minutes/Week): 210, Intensity: Mild, Exercise limited by: Other - see comments (in chemotherapy)   Goals Addressed               This Visit's Progress     Patient Stated (pt-stated)        My goal is to live and to take one day at a time.      Depression Screen PHQ 2/9 Scores 07/09/2021 11/20/2020 10/19/2020 10/19/2020 01/27/2020 09/22/2019 09/22/2019   PHQ - 2 Score 0 0 0 0 0 0 0  PHQ- 9 Score - - - - - - -    Fall Risk Fall Risk  07/09/2021 11/20/2020 10/19/2020 10/19/2020 01/27/2020  Falls in the past year? 0 0 0 0 0  Number falls in past yr: 0 0 - 0 0  Injury with Fall? 0 0 - 0 0  Risk for fall due to : No Fall Risks No Fall Risks - - No Fall Risks  Follow up - - - - Falls evaluation completed    FALL RISK PREVENTION PERTAINING TO THE HOME:  Any stairs in or around the home? Yes  If so, are there any without handrails? No  Home free of loose throw rugs in walkways, pet beds, electrical cords, etc? Yes  Adequate lighting in your home to reduce risk of falls? Yes   ASSISTIVE DEVICES UTILIZED TO PREVENT FALLS:  Life alert? No  Use of a cane, walker or w/c? No  Grab bars in the bathroom? Yes  Shower chair or bench in shower? Yes  Elevated toilet seat or a handicapped toilet? Yes   TIMED UP AND GO:  Was the test performed? No .  Length of time to ambulate 10 feet: n/a sec.   Gait steady and fast without use of assistive device  Cognitive Function: Normal cognitive status assessed by direct observation by this Nurse Health Advisor. No abnormalities found.       6CIT Screen 01/27/2020  What Year? 0 points  What month? 0 points  What time? 0 points  Count back from 20 0 points  Months in reverse 0 points  Repeat phrase 0 points  Total Score 0  Immunizations Immunization History  Administered Date(s) Administered   Fluad Quad(high Dose 65+) 06/09/2019, 06/20/2020   Influenza Whole 06/09/2010, 04/26/2012   Influenza,inj,Quad PF,6+ Mos 04/13/2014, 04/26/2016, 05/14/2017   Influenza-Unspecified 05/30/2015, 04/05/2018, 04/05/2021   PFIZER(Purple Top)SARS-COV-2 Vaccination 10/20/2019, 11/10/2019, 07/19/2020   Pneumococcal Conjugate-13 09/22/2019   Pneumococcal Polysaccharide-23 10/19/2020   Td 06/09/2010   Tdap 11/18/2011   Zoster Recombinat (Shingrix) 03/05/2018, 05/07/2018, 07/18/2018    TDAP status: Up to  date  Flu Vaccine status: Up to date  Pneumococcal vaccine status: Up to date  Covid-19 vaccine status: Completed vaccines  Qualifies for Shingles Vaccine? Yes   Zostavax completed Yes   Shingrix Completed?: Yes  Screening Tests Health Maintenance  Topic Date Due   OPHTHALMOLOGY EXAM  Never done   COVID-19 Vaccine (4 - Booster for Pfizer series) 09/13/2020   HEMOGLOBIN A1C  07/20/2021   FOOT EXAM  10/19/2021   TETANUS/TDAP  11/17/2021   COLONOSCOPY (Pts 45-22yrs Insurance coverage will need to be confirmed)  08/05/2024   Pneumonia Vaccine 77+ Years old  Completed   INFLUENZA VACCINE  Completed   Hepatitis C Screening  Completed   Zoster Vaccines- Shingrix  Completed   HPV VACCINES  Aged Out    Health Maintenance  Health Maintenance Due  Topic Date Due   OPHTHALMOLOGY EXAM  Never done   COVID-19 Vaccine (4 - Booster for [place] series) 09/13/2020    Colorectal cancer screening: Type of screening: Colonoscopy. Completed 08/05/2014. Repeat every 10 years  Lung Cancer Screening: (Low Dose CT Chest recommended if Age 37-80 years, 30 pack-year currently smoking OR have quit w/in 15years.) does not qualify.   Lung Cancer Screening Referral: no  Additional Screening:  Hepatitis C Screening: does qualify; Completed yes  Vision Screening: Recommended annual ophthalmology exams for early detection of glaucoma and other disorders of the eye. Is the patient up to date with their annual eye exam?  Yes  Who is the provider or what is the name of the office in which the patient attends annual eye exams? [facility] If pt is not established with a provider, would they like to be referred to a provider to establish care? No .   Dental Screening: Recommended annual dental exams for proper oral hygiene  Community Resource Referral / Chronic Care Management: CRR required this visit?  No   CCM required this visit?  No      Plan:     I have personally reviewed and noted the  following in the patient's chart:   Medical and social history Use of alcohol, tobacco or illicit drugs  Current medications and supplements including opioid prescriptions. Patient is not currently taking opioid prescriptions. Functional ability and status Nutritional status Physical activity Advanced directives List of other physicians Hospitalizations, surgeries, and ER visits in previous 12 months Vitals Screenings to include cognitive, depression, and falls Referrals and appointments  In addition, I have reviewed and discussed with patient certain preventive protocols, quality metrics, and best practice recommendations. A written personalized care plan for preventive services as well as general preventive health recommendations were provided to patient.     Sheral Flow, LPN   67/01/1951   Nurse Notes:  Patient is cogitatively intact. There were no vitals filed for this visit. There is no height or weight on file to calculate BMI. Patient stated that he has no issues with gait or balance; does not use any assistive devices. Hearing Screening - Comments:: Patient denied any hearing difficulty.   No  hearing aids.  Vision Screening - Comments:: Patient wears corrective glasses/contacts.  Eye exam done annually: Ronald Thompson

## 2021-07-12 ENCOUNTER — Other Ambulatory Visit: Payer: Self-pay | Admitting: Oncology

## 2021-07-12 ENCOUNTER — Other Ambulatory Visit (HOSPITAL_COMMUNITY): Payer: Self-pay

## 2021-07-12 DIAGNOSIS — Z20822 Contact with and (suspected) exposure to covid-19: Secondary | ICD-10-CM | POA: Diagnosis not present

## 2021-07-12 DIAGNOSIS — D3A8 Other benign neuroendocrine tumors: Secondary | ICD-10-CM

## 2021-07-12 MED ORDER — TEMOZOLOMIDE 180 MG PO CAPS
360.0000 mg | ORAL_CAPSULE | Freq: Every day | ORAL | 0 refills | Status: DC
  Filled 2021-07-20: qty 10, 28d supply, fill #0

## 2021-07-12 MED ORDER — CAPECITABINE 500 MG PO TABS
ORAL_TABLET | ORAL | 0 refills | Status: DC
  Filled 2021-07-20: qty 42, 28d supply, fill #0

## 2021-07-20 ENCOUNTER — Other Ambulatory Visit (HOSPITAL_COMMUNITY): Payer: Self-pay

## 2021-07-23 ENCOUNTER — Inpatient Hospital Stay: Payer: Medicare Other | Attending: Oncology | Admitting: Oncology

## 2021-07-23 ENCOUNTER — Inpatient Hospital Stay: Payer: Medicare Other

## 2021-07-23 ENCOUNTER — Other Ambulatory Visit: Payer: Self-pay

## 2021-07-23 VITALS — BP 134/88 | HR 85 | Temp 98.1°F | Resp 20 | Ht 70.0 in | Wt 163.6 lb

## 2021-07-23 DIAGNOSIS — C7A8 Other malignant neuroendocrine tumors: Secondary | ICD-10-CM | POA: Diagnosis not present

## 2021-07-23 DIAGNOSIS — D3A8 Other benign neuroendocrine tumors: Secondary | ICD-10-CM | POA: Diagnosis not present

## 2021-07-23 DIAGNOSIS — C7B8 Other secondary neuroendocrine tumors: Secondary | ICD-10-CM | POA: Diagnosis not present

## 2021-07-23 LAB — CMP (CANCER CENTER ONLY)
ALT: 19 U/L (ref 0–44)
AST: 27 U/L (ref 15–41)
Albumin: 4.4 g/dL (ref 3.5–5.0)
Alkaline Phosphatase: 67 U/L (ref 38–126)
Anion gap: 7 (ref 5–15)
BUN: 32 mg/dL — ABNORMAL HIGH (ref 8–23)
CO2: 33 mmol/L — ABNORMAL HIGH (ref 22–32)
Calcium: 9.8 mg/dL (ref 8.9–10.3)
Chloride: 99 mmol/L (ref 98–111)
Creatinine: 1.42 mg/dL — ABNORMAL HIGH (ref 0.61–1.24)
GFR, Estimated: 54 mL/min — ABNORMAL LOW (ref >60)
Glucose, Bld: 108 mg/dL — ABNORMAL HIGH (ref 70–99)
Potassium: 4 mmol/L (ref 3.5–5.1)
Sodium: 139 mmol/L (ref 135–145)
Total Bilirubin: 1.3 mg/dL — ABNORMAL HIGH (ref 0.3–1.2)
Total Protein: 6.9 g/dL (ref 6.5–8.1)

## 2021-07-23 LAB — CBC WITH DIFFERENTIAL (CANCER CENTER ONLY)
Abs Immature Granulocytes: 0.01 10*3/uL (ref 0.00–0.07)
Basophils Absolute: 0 10*3/uL (ref 0.0–0.1)
Basophils Relative: 0 %
Eosinophils Absolute: 0.1 10*3/uL (ref 0.0–0.5)
Eosinophils Relative: 3 %
HCT: 39.7 % (ref 39.0–52.0)
Hemoglobin: 13.4 g/dL (ref 13.0–17.0)
Immature Granulocytes: 0 %
Lymphocytes Relative: 7 %
Lymphs Abs: 0.3 10*3/uL — ABNORMAL LOW (ref 0.7–4.0)
MCH: 35.5 pg — ABNORMAL HIGH (ref 26.0–34.0)
MCHC: 33.8 g/dL (ref 30.0–36.0)
MCV: 105.3 fL — ABNORMAL HIGH (ref 80.0–100.0)
Monocytes Absolute: 0.7 10*3/uL (ref 0.1–1.0)
Monocytes Relative: 14 %
Neutro Abs: 3.4 10*3/uL (ref 1.7–7.7)
Neutrophils Relative %: 76 %
Platelet Count: 115 10*3/uL — ABNORMAL LOW (ref 150–400)
RBC: 3.77 MIL/uL — ABNORMAL LOW (ref 4.22–5.81)
RDW: 14.5 % (ref 11.5–15.5)
WBC Count: 4.6 10*3/uL (ref 4.0–10.5)
nRBC: 0 % (ref 0.0–0.2)

## 2021-07-23 MED ORDER — OCTREOTIDE ACETATE 30 MG IM KIT
30.0000 mg | PACK | Freq: Once | INTRAMUSCULAR | Status: AC
  Administered 2021-07-23: 09:00:00 30 mg via INTRAMUSCULAR

## 2021-07-23 NOTE — Patient Instructions (Signed)
Octreotide injection solution What is this medication? OCTREOTIDE (ok TREE oh tide) is used to reduce blood levels of growth hormone in patients with a condition called acromegaly. This medicine also reduces flushing and watery diarrhea caused by certain types of cancer. This medicine may be used for other purposes; ask your health care provider or pharmacist if you have questions. COMMON BRAND NAME(S): Bynfezia, Sandostatin What should I tell my care team before I take this medication? They need to know if you have any of these conditions: diabetes gallbladder disease kidney disease liver disease thyroid disease an unusual or allergic reaction to octreotide, other medicines, foods, dyes, or preservatives pregnant or trying to get pregnant breast-feeding How should I use this medication? This medicine is for injection under the skin or into a vein (only in emergency situations). It is usually given by a health care professional in a hospital or clinic setting. If you get this medicine at home, you will be taught how to prepare and give this medicine. Allow the injection solution to come to room temperature before use. Do not warm it artificially. Use exactly as directed. Take your medicine at regular intervals. Do not take your medicine more often than directed. It is important that you put your used needles and syringes in a special sharps container. Do not put them in a trash can. If you do not have a sharps container, call your pharmacist or healthcare provider to get one. Talk to your pediatrician regarding the use of this medicine in children. Special care may be needed. Overdosage: If you think you have taken too much of this medicine contact a poison control center or emergency room at once. NOTE: This medicine is only for you. Do not share this medicine with others. What if I miss a dose? If you miss a dose, take it as soon as you can. If it is almost time for your next dose, take only  that dose. Do not take double or extra doses. What may interact with this medication? bromocriptine certain medicines for blood pressure, heart disease, irregular heartbeat cyclosporine diuretics medicines for diabetes, including insulin quinidine This list may not describe all possible interactions. Give your health care provider a list of all the medicines, herbs, non-prescription drugs, or dietary supplements you use. Also tell them if you smoke, drink alcohol, or use illegal drugs. Some items may interact with your medicine. What should I watch for while using this medication? Visit your doctor or health care professional for regular checks on your progress. To help reduce irritation at the injection site, use a different site for each injection and make sure the solution is at room temperature before use. This medicine may cause decreases in blood sugar. Signs of low blood sugar include chills, cool, pale skin or cold sweats, drowsiness, extreme hunger, fast heartbeat, headache, nausea, nervousness or anxiety, shakiness, trembling, unsteadiness, tiredness, or weakness. Contact your doctor or health care professional right away if you experience any of these symptoms. This medicine may increase blood sugar. Ask your healthcare provider if changes in diet or medicines are needed if you have diabetes. This medicine may cause a decrease in vitamin B12. You should make sure that you get enough vitamin B12 while you are taking this medicine. Discuss the foods you eat and the vitamins you take with your health care professional. What side effects may I notice from receiving this medication? Side effects that you should report to your doctor or health care professional as soon as   possible: allergic reactions like skin rash, itching or hives, swelling of the face, lips, or tongue fast, slow, or irregular heartbeat right upper belly pain severe stomach pain signs and symptoms of high blood sugar such  as being more thirsty or hungry or having to urinate more than normal. You may also feel very tired or have blurry vision. signs and symptoms of low blood sugar such as feeling anxious; confusion; dizziness; increased hunger; unusually weak or tired; increased sweating; shakiness; cold, clammy skin; irritable; headache; blurred vision; fast heartbeat; loss of consciousness unusually weak or tired Side effects that usually do not require medical attention (report to your doctor or health care professional if they continue or are bothersome): diarrhea dizziness gas headache nausea, vomiting pain, redness, or irritation at site where injected upset stomach This list may not describe all possible side effects. Call your doctor for medical advice about side effects. You may report side effects to FDA at 1-800-FDA-1088. Where should I keep my medication? Keep out of the reach of children. Store in a refrigerator between 2 and 8 degrees C (36 and 46 degrees F). Protect from light. Allow to come to room temperature naturally. Do not use artificial heat. If protected from light, the injection may be stored at room temperature between 20 and 30 degrees C (70 and 86 degrees F) for 14 days. After the initial use, throw away any unused portion of a multiple dose vial after 14 days. Throw away unused portions of the ampules after use. NOTE: This sheet is a summary. It may not cover all possible information. If you have questions about this medicine, talk to your doctor, pharmacist, or health care provider.  2022 Elsevier/Gold Standard (2019-02-18 00:00:00)  

## 2021-07-23 NOTE — Progress Notes (Signed)
[facility] OFFICE PROGRESS NOTE   Diagnosis: Neuroendocrine tumor  INTERVAL HISTORY:   [name] completed another cycle of Xeloda/temozolomide beginning 06/25/2021.  He reports tolerating the chemotherapy better with a dose reduction.  Less malaise.  He takes Zofran for nausea on the days of temozolomide.  No mouth sores, hand/foot pain, or diarrhea.  Objective:  Vital signs in last 24 hours:  Blood pressure 134/88, pulse 85, temperature 98.1 F (36.7 C), temperature source Oral, resp. rate 20, height 5\' 10"  (1.778 m), weight 163 lb 9.6 oz (74.2 kg), SpO2 98 %.    HEENT: No thrush or ulcers Resp: Lungs clear bilaterally Cardio: Regular rate and rhythm GI: The liver edge is palpable in the right upper abdomen, nontender, no splenomegaly Vascular: No leg edema  Skin: Mild skin thickening at the palms, no breakdown   Lab Results:  Lab Results  Component Value Date   WBC 4.6 07/23/2021   HGB 13.4 07/23/2021   HCT 39.7 07/23/2021   MCV 105.3 (H) 07/23/2021   PLT 115 (L) 07/23/2021   NEUTROABS 3.4 07/23/2021    CMP  Lab Results  Component Value Date   NA 139 07/23/2021   K 4.0 07/23/2021   CL 99 07/23/2021   CO2 33 (H) 07/23/2021   GLUCOSE 108 (H) 07/23/2021   BUN 32 (H) 07/23/2021   CREATININE 1.42 (H) 07/23/2021   CALCIUM 9.8 07/23/2021   PROT 6.9 07/23/2021   ALBUMIN 4.4 07/23/2021   AST 27 07/23/2021   ALT 19 07/23/2021   ALKPHOS 67 07/23/2021   BILITOT 1.3 (H) 07/23/2021   GFRNONAA 54 (L) 07/23/2021   GFRAA >60 01/19/2020    Medications: I have reviewed the patient's current medications.   Assessment/Plan: Pancreatic neuroendocrine tumor, WHO grade 2, pancreatic head mass and small peripancreatic/celiac nodes on a CT 02/04/2014 EUS revealed evidence of multiple liver metastases-status post an FNA biopsy of a left liver lesion 02/10/2014 confirming a neuroendocrine tumor   Octreotide scan 04/01/2014 with multiple foci of metastatic  neuroendocrine tumor in the liver Monthly Sandostatin started 03/16/2014 Restaging CT 07/04/2014 with resolution of a previously noted pancreas head lesion and probable progression of liver metastases Restaging CT 10/31/2014-stable liver lesions, no evidence of a pancreas mass, stable. Restaging CT 02/28/2015-stable hepatic metastases, stable pancreas lesion unchanged Restaging CT 08/30/2015-stable pancreas mass, slight enlargement of hepatic metastases Monthly Sandostatin continued Restaging CTs 01/17/2016 revealed enlargement of liver lesions, no new lesions, enlargement of the pancreas head mass Gallium DOTATATE scan 10/01/2016 confirmed uptake in the liver metastases, pancreas primary, peripancreatic adenopathy, and left iliac bone Cycle 1 Lutathera 03/05/2017 Cycle 2 Lutathera 04/30/2017 Cycle 3 Lutathera 07/02/2017 Cycle 4 Lutathera 08/27/2017 Netspot scan 09/29/2017-decreased radiotracer activity within the pancreas head, peripancreatic lymph node, and solitary skeletal lesion.  Liver lesions have increased in size with a decrease in radiotracer activity Monthly Sandostatin continued Netspot scan 05/20/2018-stable to decreased size of liver lesions, most have decreased SUV, similar uptake in the pancreas lesion, previously noted peripancreatic lymph node has resolved him a mild decrease in uptake associated with a left iliac metastasis, no evidence of disease progression Monthly Sandostatin continued Netspot scan 01/29/2019- overall stable to improved, 1 lesion left hepatic lobe has increased tracer activity-unchanged in size, majority of hepatic lesions have reduced activity compared to the pretreatment scan, decreased activity left iliac bone lesion, stable activity in the head of the pancreas lesion, no new lesions Monthly Sandostatin continued Netspot on September 24, 2019-increase in size of 3 liver lesions  with associated stable radiotracer activity, no new lesions, stable small left  iliac lesion Cycle 1 salvage Lutathera 10/27/2019 Cycle 2 salvage Lutathera 12/22/2019 Netspot on 02/15/2020-decrease in radiotracer activity in the liver metastases with a decrease in size of several lesions, no new lesions, no progressive disease in the bowel or mesentery, decreased activity in the left iliac bone lesion Monthly Sandostatin continued  Netspot 10/13/2020-decrease in radiotracer activity in the left and right liver, decreased size of liver lesions, one lesion in the superior right liver has increased in size and has no radiotracer activity, no evidence of metastatic disease outside the liver PET 11/15/2020- moderate hypermetabolic activity associated with several enlarging liver lesions that had limited activity on the dotatate PET, a lesion in the left lateral hepatic lobe with mild persistent dotatate activity has mild peripheral FDG activity and is decreased in size from a dotatate PET 1 year ago.  No hypermetabolic activity in the pancreas or bowel.  No hypermetabolic activity in the previous dotatate avid left iliac lesion Monthly Sandostatin continued Cycle 1 capecitabine/temozolomide 01/22/2021 (capecitabine twice daily days 1 through 14 of each 28-day cycle/temozolomide days 10 through 14) Cycle 2 capecitabine/temozolomide 02/19/2021 Cycle 3 capecitabine/temozolomide 03/19/2021 Cycle 4 capecitabine/temozolomide 04/17/2021 PET 05/11/2021-slight increase in size of several FDG avid liver lesions, no new hepatic lesions, no evidence of metastatic disease to the chest or bones Cycle 5 capecitabine/temozolomide 05/16/2021 Cycle 6 capecitabine/temozolomide 06/25/2021, capecitabine dose reduced Cycle 7 capecitabine/temozolomide 07/23/2021    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 chronic exertional dyspnea following treatment for the seminoma   5. aortic stenosis on an echocardiogram December 2011, severe aortic stenosis on echocardiogram 12/06/2015, status post TAVR on 04/16/2016 6. lumbar disc surgery 2014   7. acute abdominal pain 02/04/2014-resolved   8.  Diabetes 9.  COVID-19 infection July 2022      Disposition: [name] appears stable.  He will complete another cycle of capecitabine/temozolomide beginning today.  He will return for an office visit in 1 month.  He continues monthly Sandostatin.  He will undergo a restaging evaluation after the next cycle of chemotherapy.  Betsy Coder, MD  07/23/2021  9:09 AM

## 2021-07-24 LAB — CHROMOGRANIN A: Chromogranin A (ng/mL): 20610 ng/mL — ABNORMAL HIGH (ref 0.0–101.8)

## 2021-08-01 ENCOUNTER — Ambulatory Visit: Payer: Medicare Other | Admitting: Internal Medicine

## 2021-08-08 ENCOUNTER — Ambulatory Visit (INDEPENDENT_AMBULATORY_CARE_PROVIDER_SITE_OTHER): Payer: Medicare Other | Admitting: Internal Medicine

## 2021-08-08 ENCOUNTER — Encounter: Payer: Self-pay | Admitting: Internal Medicine

## 2021-08-08 ENCOUNTER — Ambulatory Visit: Payer: Medicare Other | Admitting: Internal Medicine

## 2021-08-08 ENCOUNTER — Other Ambulatory Visit: Payer: Self-pay

## 2021-08-08 VITALS — BP 122/84 | HR 81 | Ht 70.0 in | Wt 167.0 lb

## 2021-08-08 DIAGNOSIS — E119 Type 2 diabetes mellitus without complications: Secondary | ICD-10-CM | POA: Diagnosis not present

## 2021-08-08 DIAGNOSIS — E1165 Type 2 diabetes mellitus with hyperglycemia: Secondary | ICD-10-CM

## 2021-08-08 LAB — MICROALBUMIN / CREATININE URINE RATIO
Creatinine,U: 78.2 mg/dL
Microalb Creat Ratio: 3.5 mg/g (ref 0.0–30.0)
Microalb, Ur: 2.7 mg/dL — ABNORMAL HIGH (ref 0.0–1.9)

## 2021-08-08 LAB — POCT GLYCOSYLATED HEMOGLOBIN (HGB A1C): Hemoglobin A1C: 5.7 % — AB (ref 4.0–5.6)

## 2021-08-08 NOTE — Patient Instructions (Signed)
Keep up the Good Work.    If you sugars  remain over 150 mg/dL, please let me know

## 2021-08-08 NOTE — Progress Notes (Signed)
Name: Ronald Thompson  Age/ Sex: 66 y.o., male   MRN/ DOB: [id number], Dec 14, 1955     PCP: [name], MD   Reason for Endocrinology Evaluation: Type 2 Diabetes Mellitus  Initial Endocrine Consultative Visit: 10/11/2019    PATIENT IDENTIFIER: Ronald Thompson is a 66 y.o. male with a past medical history of HTN and pancreatic neuroendocrine tumor Dx7/11/2013), Hx of lung seminoma 1990 and S/P TAVR 2017. The patient has followed with Endocrinology clinic since 10/11/2019 for consultative assistance with management of his diabetes.  DIABETIC HISTORY:  Ronald Thompson was diagnosed with DM in 09/2019. He was started on Metformin. His hemoglobin A1c was 9.7% on initial diagnosis.   Was diagnosed with neuroendocrine tumor in 2015, he is status post Lutathera , and has been on octreotide treatments since 2015.    Metformin stopped in 07/2020 SUBJECTIVE:   During the last visit (07/20/2020): A1c 5.6%. Stopped  metformin      Today (08/08/2021): Ronald Thompson is here for a follow up on diabetes management.  He checks his blood sugars 2 times a week. The patient has not had hypoglycemic episodes since the last clinic visit    Follows with Dr. Ammie Dalton for neuroendcrine tumor , there has been evidence of progression with dedifferentiation , continues on  chemo with Capecitabine/temozolomide 01/22/2021  Weight has been stable  Denies nausea, vomiting or diarrhea  Has constipation   Denies local neck symptoms   Wife has [place]:  N/A      METER DOWNLOAD SUMMARY: Date range evaluated:12/21-08/08/2020 Average Number Tests/Day = 0.3 Overall Mean FS Glucose = 108   BG Ranges: Low = 100 High = 111   Hypoglycemic Events/30 Days: BG < 50 = 0 Episodes of symptomatic severe hypoglycemia = 0      DIABETIC COMPLICATIONS: Microvascular complications:    Denies: CKD, retinopathy, neuropathy Last eye exam: Completed 2020   Macrovascular complications:  S/P Aortic  valve replacement.  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 25 yrs ago-during cancer tx.   Past Surgical History:  Past Surgical History:  Procedure Laterality Date   BACK SURGERY     '14- rupt. disc    CARDIAC CATHETERIZATION N/A 01/24/2016   Procedure: Right/Left Heart Cath and Coronary Angiography;  Surgeon: [name], MD;  Location: [facility];  Service: Cardiovascular;  Laterality: N/A;  COLONOSCOPY     EUS N/A 02/10/2014   Procedure: UPPER ENDOSCOPIC ULTRASOUND (EUS) LINEAR;  Surgeon: [name], MD;  Location: [facility];  Service: Endoscopy;  Laterality: N/A;   IR RADIOLOGIST EVAL & MGMT  02/11/2017   KNEE SURGERY Right    age 58 for Baker's cyst   NASAL SEPTUM SURGERY     Deviated septrum   TEE WITHOUT CARDIOVERSION N/A 04/16/2016   Procedure: TRANSESOPHAGEAL ECHOCARDIOGRAM (TEE);  Surgeon: [name], MD;  Location: [facility];  Service: Open Heart Surgery;  Laterality: N/A;   THORACOTOMY  1990   wedge  biopsy of mediastinal mass   TRANSCATHETER AORTIC VALVE REPLACEMENT, TRANSFEMORAL N/A 04/16/2016   Procedure: TRANSCATHETER AORTIC VALVE REPLACEMENT, TRANSFEMORAL;  Surgeon: [name], MD;  Location: [facility];  Service: Open Heart Surgery;  Laterality: N/A;   Social History:  reports that he has never smoked. He has never used smokeless tobacco. He reports current alcohol use. He reports that he does not use drugs. Family History:  Family History  Problem Relation Age of Onset   Dementia Mother    Cancer Paternal Grandfather        esophagus   Cancer Maternal Aunt        colon   Emphysema Maternal Aunt      HOME MEDICATIONS: Allergies as of 08/08/2021       Reactions   Penicillins Hives   ENTIRE BODY Has patient had a PCN reaction causing immediate rash, facial/tongue/throat swelling, SOB or lightheadedness with hypotension: No Has patient had a PCN reaction causing severe rash involving mucus membranes or skin necrosis: No Has patient had a PCN reaction that required hospitalization * *  YES  * * Has patient had a PCN reaction occurring within the last 10 years: No If all of the above answers are "NO", then may proceed with Cephalosporin use. *reaction occurred when he was 17        Medication List        Accurate as of August 08, 2021  2:45 PM. If you have any questions, ask your nurse or doctor.          acetaminophen 500 MG tablet Commonly known as: TYLENOL Take 1,000 mg by mouth every 6 (six) hours as needed for headache.   albuterol 108 (90 Base) MCG/ACT inhaler Commonly known as: VENTOLIN HFA Inhale 1-2 puffs into the lungs every 6 (six) hours as needed for wheezing or shortness of breath.   Blood Pressure Cuff Misc Use as directed   calcium carbonate 500 MG chewable tablet Commonly known as: TUMS - dosed in mg elemental calcium Chew 3 tablets by mouth 3 (three) times daily as needed for indigestion or heartburn.   capecitabine 500 MG tablet Commonly  known as: XELODA Take #2 tablets (1000 mg) in am and #1 tablet (500 mg) in pm daily on days 1-14 of each 28 day cycle. Start on 07/23/2021   clindamycin 300 MG capsule Commonly known as: CLEOCIN Take 2 capsules by mouth one hour prior to dental appointment   docusate sodium 100 MG capsule Commonly known as: COLACE Take 100 mg by mouth daily as needed for mild constipation.   gabapentin 300 MG capsule Commonly known as: NEURONTIN TAKE ONE CAPSULE BY MOUTH THREE TIMES A DAY   START AFRER 100 MG INTIAL RX   ibuprofen 200 MG tablet Commonly known as: ADVIL Take 400 mg by mouth every 6 (six) hours as needed for headache or mild pain.  Lancet Device Misc Use as instructed to test blood sugar daily E11.65   losartan-hydrochlorothiazide 100-25 MG tablet Commonly known as: HYZAAR TAKE ONE TABLET BY MOUTH DAILY   metFORMIN 500 MG 24 hr tablet Commonly known as: GLUCOPHAGE-XR Take 1 tablet (500 mg total) by mouth daily with breakfast.   ondansetron 8 MG tablet Commonly known as: ZOFRAN Take 1 tablet (8 mg total) by mouth as directed. Take one tablet 30 minutes prior to each Temodar dose and every 8 hours as needed for nausea   OneTouch Delica Lancets 86P Misc Use as instructed to test blood sugar once daily E11.65   OneTouch Verio test strip Generic drug: glucose blood 1 each by Other route daily in the afternoon. Use as instructed to test blood sugar one time daily E11.65   SANDOSTATIN IJ Inject 1 each as directed every 30 (thirty) days.   sildenafil 100 MG tablet Commonly known as: VIAGRA TAKE ONE HALF TO ONE TABLET BY MOUTH DAILY AS NEEDED FOR FOR ERECTILE DYSFUNCTION   simvastatin 20 MG tablet Commonly known as: ZOCOR TAKE ONE TABLET BY MOUTH DAILY AT 8PM   temozolomide 180 MG capsule Commonly known as: TEMODAR Take 2 capsules (360 mg total) by mouth daily. Take on days 10-14 of each 28 day cycle. May take on an empty stomach or at bedtime to decrease nausea &  vomiting. Start on 08/01/21         OBJECTIVE:   Vital Signs: BP 122/84 (BP Location: Left Arm, Patient Position: Sitting, Cuff Size: Small)    Pulse 81    Ht 5\' 10"  (1.778 m)    Wt 167 lb (75.8 kg)    SpO2 95%    BMI 23.96 kg/m   Wt Readings from Last 3 Encounters:  08/08/21 167 lb (75.8 kg)  07/23/21 163 lb 9.6 oz (74.2 kg)  06/19/21 165 lb 9.6 oz (75.1 kg)     Exam: General: Pt appears well and is in NAD  Lungs: Clear with good BS bilat with no rales, rhonchi, or wheezes  Heart: RRR   Abdomen: Normoactive bowel sounds, soft, nontender, without masses or organomegaly palpable  Extremities: No pretibial edema.   Neuro: MS is good with appropriate affect, pt is alert and Ox3     DM foot exam: 08/08/2021   The skin of the feet is intact without sores or ulcerations. The pedal pulses are 2+ on right and 2+ on left. The sensation is intact to a screening 5.07, 10 gram monofilament bilaterally    DATA REVIEWED:  Lab Results  Component Value Date   HGBA1C 5.7 (A) 08/08/2021   HGBA1C 5.7 (A) 01/18/2021   HGBA1C 6.1 10/19/2020    Latest Reference Range & Units 07/23/21 08:09  Sodium 135 - 145 mmol/L 139  Potassium 3.5 - 5.1 mmol/L 4.0  Chloride 98 - 111 mmol/L 99  CO2 22 - 32 mmol/L 33 (H)  Glucose 70 - 99 mg/dL 108 (H)  BUN 8 - 23 mg/dL 32 (H)  Creatinine 0.61 - 1.24 mg/dL 1.42 (H)  Calcium 8.9 - 10.3 mg/dL 9.8  Anion gap 5 - 15  7  Alkaline Phosphatase 38 - 126 U/L 67  Albumin 3.5 - 5.0 g/dL 4.4  AST 15 - 41 U/L 27  ALT 0 - 44 U/L 19  Total Protein 6.5 - 8.1 g/dL 6.9  Total Bilirubin 0.3 - 1.2 mg/dL 1.3 (H)  GFR, Est Non African American >60 mL/min 54 (L)    ASSESSMENT / PLAN /  RECOMMENDATIONS:   1) Type 2 Diabetes Mellitus, In remission , Without complications - Most recent A1c of 5.7 %. Goal A1c < 7.0 %.    - He continues to do well with lifestyle changes  - He has been off Metformin since 07/2020  -Patient encouraged to continue with glucose checks a  few times a week -Patient to contact our office if BG's are consistently >150 mg/dL    MEDICATIONS: N/A   EDUCATION / INSTRUCTIONS: BG monitoring instructions: Patient is instructed to check his blood sugars 2- 3 times a week.     2) Diabetic complications:  Eye: Does not have known diabetic retinopathy.  Neuro/ Feet: Does not have known diabetic peripheral neuropathy .  Renal: Patient does not have known baseline CKD. Microalbuminuria is normal    Recommend continuation of current Tx with primary MD and consultative f/u at [facility] in the future if pt's DM control becomes problematic.   Signed electronically by: Mack Guise, MD  [facility]  [facility] [address] [place],  [address] Phone: [phone] FAX: [phone]   CC: [name], [place] [address] Phone: [phone]  Fax: [phone]  Return to Endocrinology clinic as below: Future Appointments  Date Time Provider [place]  08/21/2021  8:30 AM [facility] PHLEBOTOMIST [facility] None  08/21/2021  9:00 AM Ladell Pier, MD [facility] None  08/21/2021  9:30 AM [facility] FLUSH ROOM [facility] None  10/23/2021 10:40 AM [name], MD [facility] None  06/12/2022 11:15 AM [facility] ECHO 3 [id number]

## 2021-08-13 ENCOUNTER — Other Ambulatory Visit (HOSPITAL_COMMUNITY): Payer: Self-pay

## 2021-08-13 ENCOUNTER — Other Ambulatory Visit: Payer: Self-pay | Admitting: Oncology

## 2021-08-13 DIAGNOSIS — D3A8 Other benign neuroendocrine tumors: Secondary | ICD-10-CM

## 2021-08-13 MED ORDER — CAPECITABINE 500 MG PO TABS
ORAL_TABLET | ORAL | 0 refills | Status: DC
  Filled 2021-08-20: qty 42, 28d supply, fill #0

## 2021-08-13 MED ORDER — TEMOZOLOMIDE 180 MG PO CAPS
360.0000 mg | ORAL_CAPSULE | Freq: Every day | ORAL | 0 refills | Status: DC
  Filled 2021-08-20: qty 10, 28d supply, fill #0

## 2021-08-20 ENCOUNTER — Other Ambulatory Visit: Payer: Self-pay | Admitting: Internal Medicine

## 2021-08-20 ENCOUNTER — Other Ambulatory Visit (HOSPITAL_COMMUNITY): Payer: Self-pay

## 2021-08-21 ENCOUNTER — Inpatient Hospital Stay: Payer: Medicare Other

## 2021-08-21 ENCOUNTER — Inpatient Hospital Stay: Payer: Medicare Other | Attending: Oncology | Admitting: Oncology

## 2021-08-21 ENCOUNTER — Other Ambulatory Visit: Payer: Self-pay

## 2021-08-21 VITALS — BP 132/78 | HR 80 | Temp 97.8°F | Resp 20 | Ht 70.0 in | Wt 164.8 lb

## 2021-08-21 DIAGNOSIS — D3A8 Other benign neuroendocrine tumors: Secondary | ICD-10-CM | POA: Diagnosis not present

## 2021-08-21 DIAGNOSIS — C7B8 Other secondary neuroendocrine tumors: Secondary | ICD-10-CM | POA: Insufficient documentation

## 2021-08-21 DIAGNOSIS — C7A8 Other malignant neuroendocrine tumors: Secondary | ICD-10-CM | POA: Diagnosis not present

## 2021-08-21 LAB — CBC WITH DIFFERENTIAL (CANCER CENTER ONLY)
Abs Immature Granulocytes: 0 10*3/uL (ref 0.00–0.07)
Basophils Absolute: 0 10*3/uL (ref 0.0–0.1)
Basophils Relative: 1 %
Eosinophils Absolute: 0.1 10*3/uL (ref 0.0–0.5)
Eosinophils Relative: 3 %
HCT: 39.4 % (ref 39.0–52.0)
Hemoglobin: 13.5 g/dL (ref 13.0–17.0)
Immature Granulocytes: 0 %
Lymphocytes Relative: 9 %
Lymphs Abs: 0.4 10*3/uL — ABNORMAL LOW (ref 0.7–4.0)
MCH: 35 pg — ABNORMAL HIGH (ref 26.0–34.0)
MCHC: 34.3 g/dL (ref 30.0–36.0)
MCV: 102.1 fL — ABNORMAL HIGH (ref 80.0–100.0)
Monocytes Absolute: 0.7 10*3/uL (ref 0.1–1.0)
Monocytes Relative: 18 %
Neutro Abs: 2.9 10*3/uL (ref 1.7–7.7)
Neutrophils Relative %: 69 %
Platelet Count: 119 10*3/uL — ABNORMAL LOW (ref 150–400)
RBC: 3.86 MIL/uL — ABNORMAL LOW (ref 4.22–5.81)
RDW: 14 % (ref 11.5–15.5)
WBC Count: 4.1 10*3/uL (ref 4.0–10.5)
nRBC: 0 % (ref 0.0–0.2)

## 2021-08-21 LAB — CMP (CANCER CENTER ONLY)
ALT: 18 U/L (ref 0–44)
AST: 25 U/L (ref 15–41)
Albumin: 4.3 g/dL (ref 3.5–5.0)
Alkaline Phosphatase: 68 U/L (ref 38–126)
Anion gap: 6 (ref 5–15)
BUN: 33 mg/dL — ABNORMAL HIGH (ref 8–23)
CO2: 33 mmol/L — ABNORMAL HIGH (ref 22–32)
Calcium: 9.8 mg/dL (ref 8.9–10.3)
Chloride: 99 mmol/L (ref 98–111)
Creatinine: 1.26 mg/dL — ABNORMAL HIGH (ref 0.61–1.24)
GFR, Estimated: >60 mL/min (ref >60)
Glucose, Bld: 105 mg/dL — ABNORMAL HIGH (ref 70–99)
Potassium: 4.3 mmol/L (ref 3.5–5.1)
Sodium: 138 mmol/L (ref 135–145)
Total Bilirubin: 1.2 mg/dL (ref 0.3–1.2)
Total Protein: 6.8 g/dL (ref 6.5–8.1)

## 2021-08-21 MED ORDER — OCTREOTIDE ACETATE 30 MG IM KIT
30.0000 mg | PACK | Freq: Once | INTRAMUSCULAR | Status: AC
  Administered 2021-08-21: 30 mg via INTRAMUSCULAR
  Filled 2021-08-21: qty 1

## 2021-08-21 NOTE — Patient Instructions (Signed)
Octreotide injection solution What is this medication? OCTREOTIDE (ok TREE oh tide) is used to reduce blood levels of growth hormone in patients with a condition called acromegaly. This medicine also reduces flushing and watery diarrhea caused by certain types of cancer. This medicine may be used for other purposes; ask your health care provider or pharmacist if you have questions. COMMON BRAND NAME(S): Bynfezia, Sandostatin What should I tell my care team before I take this medication? They need to know if you have any of these conditions: diabetes gallbladder disease kidney disease liver disease thyroid disease an unusual or allergic reaction to octreotide, other medicines, foods, dyes, or preservatives pregnant or trying to get pregnant breast-feeding How should I use this medication? This medicine is for injection under the skin or into a vein (only in emergency situations). It is usually given by a health care professional in a hospital or clinic setting. If you get this medicine at home, you will be taught how to prepare and give this medicine. Allow the injection solution to come to room temperature before use. Do not warm it artificially. Use exactly as directed. Take your medicine at regular intervals. Do not take your medicine more often than directed. It is important that you put your used needles and syringes in a special sharps container. Do not put them in a trash can. If you do not have a sharps container, call your pharmacist or healthcare provider to get one. Talk to your pediatrician regarding the use of this medicine in children. Special care may be needed. Overdosage: If you think you have taken too much of this medicine contact a poison control center or emergency room at once. NOTE: This medicine is only for you. Do not share this medicine with others. What if I miss a dose? If you miss a dose, take it as soon as you can. If it is almost time for your next dose, take only  that dose. Do not take double or extra doses. What may interact with this medication? bromocriptine certain medicines for blood pressure, heart disease, irregular heartbeat cyclosporine diuretics medicines for diabetes, including insulin quinidine This list may not describe all possible interactions. Give your health care provider a list of all the medicines, herbs, non-prescription drugs, or dietary supplements you use. Also tell them if you smoke, drink alcohol, or use illegal drugs. Some items may interact with your medicine. What should I watch for while using this medication? Visit your doctor or health care professional for regular checks on your progress. To help reduce irritation at the injection site, use a different site for each injection and make sure the solution is at room temperature before use. This medicine may cause decreases in blood sugar. Signs of low blood sugar include chills, cool, pale skin or cold sweats, drowsiness, extreme hunger, fast heartbeat, headache, nausea, nervousness or anxiety, shakiness, trembling, unsteadiness, tiredness, or weakness. Contact your doctor or health care professional right away if you experience any of these symptoms. This medicine may increase blood sugar. Ask your healthcare provider if changes in diet or medicines are needed if you have diabetes. This medicine may cause a decrease in vitamin B12. You should make sure that you get enough vitamin B12 while you are taking this medicine. Discuss the foods you eat and the vitamins you take with your health care professional. What side effects may I notice from receiving this medication? Side effects that you should report to your doctor or health care professional as soon as   possible: allergic reactions like skin rash, itching or hives, swelling of the face, lips, or tongue fast, slow, or irregular heartbeat right upper belly pain severe stomach pain signs and symptoms of high blood sugar such  as being more thirsty or hungry or having to urinate more than normal. You may also feel very tired or have blurry vision. signs and symptoms of low blood sugar such as feeling anxious; confusion; dizziness; increased hunger; unusually weak or tired; increased sweating; shakiness; cold, clammy skin; irritable; headache; blurred vision; fast heartbeat; loss of consciousness unusually weak or tired Side effects that usually do not require medical attention (report to your doctor or health care professional if they continue or are bothersome): diarrhea dizziness gas headache nausea, vomiting pain, redness, or irritation at site where injected upset stomach This list may not describe all possible side effects. Call your doctor for medical advice about side effects. You may report side effects to FDA at 1-800-FDA-1088. Where should I keep my medication? Keep out of the reach of children. Store in a refrigerator between 2 and 8 degrees C (36 and 46 degrees F). Protect from light. Allow to come to room temperature naturally. Do not use artificial heat. If protected from light, the injection may be stored at room temperature between 20 and 30 degrees C (70 and 86 degrees F) for 14 days. After the initial use, throw away any unused portion of a multiple dose vial after 14 days. Throw away unused portions of the ampules after use. NOTE: This sheet is a summary. It may not cover all possible information. If you have questions about this medicine, talk to your doctor, pharmacist, or health care provider.  2022 Elsevier/Gold Standard (2019-02-18 00:00:00)  

## 2021-08-21 NOTE — Progress Notes (Signed)
[place] OFFICE PROGRESS NOTE   Diagnosis: Pancreas neuroendocrine tumor  INTERVAL HISTORY:   Ronald Thompson returns as scheduled.  He continues monthly Sandostatin.  He completed another cycle of Xeloda/temozolomide beginning 07/23/2021.  He reports less fatigue with the dose reduction of Xeloda.  No mouth sores, nausea, or diarrhea.  He reports bilateral leg pain in the mornings.  No leg swelling.  Objective:  Vital signs in last 24 hours:  Blood pressure 132/78, pulse 80, temperature 97.8 F (36.6 C), temperature source Oral, resp. rate 20, height 5\' 10"  (1.778 m), weight 164 lb 12.8 oz (74.8 kg), SpO2 99 %.    HEENT: No thrush or ulcers Resp: Lungs clear bilaterally Cardio: Regular rate and rhythm GI: No splenomegaly.  Fullness in the right upper abdomen without a discrete liver edge.  Nontender Vascular: No leg edema  Skin: Mild hyperpigmentation and skin thickening of the palms   Lab Results:  Lab Results  Component Value Date   WBC 4.1 08/21/2021   HGB 13.5 08/21/2021   HCT 39.4 08/21/2021   MCV 102.1 (H) 08/21/2021   PLT 119 (L) 08/21/2021   NEUTROABS 2.9 08/21/2021    CMP  Lab Results  Component Value Date   NA 138 08/21/2021   K 4.3 08/21/2021   CL 99 08/21/2021   CO2 33 (H) 08/21/2021   GLUCOSE 105 (H) 08/21/2021   BUN 33 (H) 08/21/2021   CREATININE 1.26 (H) 08/21/2021   CALCIUM 9.8 08/21/2021   PROT 6.8 08/21/2021   ALBUMIN 4.3 08/21/2021   AST 25 08/21/2021   ALT 18 08/21/2021   ALKPHOS 68 08/21/2021   BILITOT 1.2 08/21/2021   GFRNONAA >60 08/21/2021   GFRAA >60 01/19/2020    No results found for: CEA1, CEA, CAN199, CA125  Lab Results  Component Value Date   INR 1.30 04/16/2016   LABPROT 16.3 (H) 04/16/2016    Imaging:  No results found.  Medications: I have reviewed the patient's current medications.   Assessment/Plan: Pancreatic neuroendocrine tumor, WHO grade 2, pancreatic head mass and small peripancreatic/celiac  nodes on a CT 02/04/2014 EUS revealed evidence of multiple liver metastases-status post an FNA biopsy of a left liver lesion 02/10/2014 confirming a neuroendocrine tumor   Octreotide scan 04/01/2014 with multiple foci of metastatic neuroendocrine tumor in the liver Monthly Sandostatin started 03/16/2014 Restaging CT 07/04/2014 with resolution of a previously noted pancreas head lesion and probable progression of liver metastases Restaging CT 10/31/2014-stable liver lesions, no evidence of a pancreas mass, stable. Restaging CT 02/28/2015-stable hepatic metastases, stable pancreas lesion unchanged Restaging CT 08/30/2015-stable pancreas mass, slight enlargement of hepatic metastases Monthly Sandostatin continued Restaging CTs 01/17/2016 revealed enlargement of liver lesions, no new lesions, enlargement of the pancreas head mass Gallium DOTATATE scan 10/01/2016 confirmed uptake in the liver metastases, pancreas primary, peripancreatic adenopathy, and left iliac bone Cycle 1 Lutathera 03/05/2017 Cycle 2 Lutathera 04/30/2017 Cycle 3 Lutathera 07/02/2017 Cycle 4 Lutathera 08/27/2017 Netspot scan 09/29/2017-decreased radiotracer activity within the pancreas head, peripancreatic lymph node, and solitary skeletal lesion.  Liver lesions have increased in size with a decrease in radiotracer activity Monthly Sandostatin continued Netspot scan 05/20/2018-stable to decreased size of liver lesions, most have decreased SUV, similar uptake in the pancreas lesion, previously noted peripancreatic lymph node has resolved him a mild decrease in uptake associated with a left iliac metastasis, no evidence of disease progression Monthly Sandostatin continued Netspot scan 01/29/2019- overall stable to improved, 1 lesion left hepatic lobe has increased tracer activity-unchanged in  size, majority of hepatic lesions have reduced activity compared to the pretreatment scan, decreased activity left iliac bone lesion, stable  activity in the head of the pancreas lesion, no new lesions Monthly Sandostatin continued Netspot on September 24, 2019-increase in size of 3 liver lesions with associated stable radiotracer activity, no new lesions, stable small left iliac lesion Cycle 1 salvage Lutathera 10/27/2019 Cycle 2 salvage Lutathera 12/22/2019 Netspot on 02/15/2020-decrease in radiotracer activity in the liver metastases with a decrease in size of several lesions, no new lesions, no progressive disease in the bowel or mesentery, decreased activity in the left iliac bone lesion Monthly Sandostatin continued  Netspot 10/13/2020-decrease in radiotracer activity in the left and right liver, decreased size of liver lesions, one lesion in the superior right liver has increased in size and has no radiotracer activity, no evidence of metastatic disease outside the liver PET 11/15/2020- moderate hypermetabolic activity associated with several enlarging liver lesions that had limited activity on the dotatate PET, a lesion in the left lateral hepatic lobe with mild persistent dotatate activity has mild peripheral FDG activity and is decreased in size from a dotatate PET 1 year ago.  No hypermetabolic activity in the pancreas or bowel.  No hypermetabolic activity in the previous dotatate avid left iliac lesion Monthly Sandostatin continued Cycle 1 capecitabine/temozolomide 01/22/2021 (capecitabine twice daily days 1 through 14 of each 28-day cycle/temozolomide days 10 through 14) Cycle 2 capecitabine/temozolomide 02/19/2021 Cycle 3 capecitabine/temozolomide 03/19/2021 Cycle 4 capecitabine/temozolomide 04/17/2021 PET 05/11/2021-slight increase in size of several FDG avid liver lesions, no new hepatic lesions, no evidence of metastatic disease to the chest or bones Cycle 5 capecitabine/temozolomide 05/16/2021 Cycle 6 capecitabine/temozolomide 06/25/2021, capecitabine dose reduced Cycle 7 capecitabine/temozolomide 07/23/2021 Cycle 8  capecitabine/temozolomide 08/20/2021    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 chronic exertional dyspnea following treatment for the seminoma   5. aortic stenosis on an echocardiogram December 2011, severe aortic stenosis on echocardiogram 12/06/2015, status post TAVR on 04/16/2016 6. lumbar disc surgery 2014   7. acute abdominal pain 02/04/2014-resolved   8.  Diabetes 9.  COVID-19 infection July 2022      Disposition: Ronald Thompson appears stable.  He began another cycle of Xeloda/temozolomide yesterday.  He will undergo a restaging PET scan prior to an office visit in 1 month.  He continues monthly Sandostatin.  Betsy Coder, MD  08/21/2021  9:29 AM

## 2021-08-23 ENCOUNTER — Telehealth: Payer: Self-pay | Admitting: *Deleted

## 2021-08-23 NOTE — Telephone Encounter (Signed)
Notified Ronald Thompson of PET on 29/23 at [facility] w/arrival at 0730 for 0800 scan. Low carb supper night before and NPO except water for 6 hours prior. Do not take insulin in am if you are on insulin. He verbalized understanding.

## 2021-09-10 ENCOUNTER — Other Ambulatory Visit: Payer: Self-pay | Admitting: Oncology

## 2021-09-10 ENCOUNTER — Other Ambulatory Visit (HOSPITAL_COMMUNITY): Payer: Self-pay

## 2021-09-10 DIAGNOSIS — D3A8 Other benign neuroendocrine tumors: Secondary | ICD-10-CM

## 2021-09-10 NOTE — Telephone Encounter (Signed)
Will discuss w/MD. Next cycle due 2/13. Having PET scan on 2/09

## 2021-09-11 NOTE — Telephone Encounter (Signed)
Per Dr. [name]: Hold on refill till PET scan results are seen

## 2021-09-12 ENCOUNTER — Other Ambulatory Visit: Payer: Self-pay | Admitting: Oncology

## 2021-09-12 ENCOUNTER — Other Ambulatory Visit (HOSPITAL_COMMUNITY): Payer: Self-pay

## 2021-09-12 DIAGNOSIS — D3A8 Other benign neuroendocrine tumors: Secondary | ICD-10-CM

## 2021-09-13 ENCOUNTER — Encounter (HOSPITAL_COMMUNITY)
Admission: RE | Admit: 2021-09-13 | Discharge: 2021-09-13 | Disposition: A | Discharge status: Home / Self Care | Payer: Medicare Other | Source: Ambulatory Visit | Attending: Oncology | Admitting: Oncology

## 2021-09-13 ENCOUNTER — Other Ambulatory Visit (HOSPITAL_COMMUNITY): Payer: Self-pay

## 2021-09-13 ENCOUNTER — Other Ambulatory Visit: Payer: Self-pay

## 2021-09-13 DIAGNOSIS — C254 Malignant neoplasm of endocrine pancreas: Secondary | ICD-10-CM | POA: Diagnosis not present

## 2021-09-13 DIAGNOSIS — C7A098 Malignant carcinoid tumors of other sites: Secondary | ICD-10-CM | POA: Diagnosis not present

## 2021-09-13 DIAGNOSIS — I059 Rheumatic mitral valve disease, unspecified: Secondary | ICD-10-CM | POA: Diagnosis not present

## 2021-09-13 DIAGNOSIS — I2510 Atherosclerotic heart disease of native coronary artery without angina pectoris: Secondary | ICD-10-CM | POA: Diagnosis not present

## 2021-09-13 DIAGNOSIS — J9811 Atelectasis: Secondary | ICD-10-CM | POA: Diagnosis not present

## 2021-09-13 DIAGNOSIS — D3A8 Other benign neuroendocrine tumors: Secondary | ICD-10-CM | POA: Diagnosis not present

## 2021-09-13 DIAGNOSIS — C787 Secondary malignant neoplasm of liver and intrahepatic bile duct: Secondary | ICD-10-CM | POA: Insufficient documentation

## 2021-09-13 DIAGNOSIS — Q6102 Congenital multiple renal cysts: Secondary | ICD-10-CM | POA: Insufficient documentation

## 2021-09-13 DIAGNOSIS — Z86012 Personal history of benign carcinoid tumor: Secondary | ICD-10-CM | POA: Diagnosis not present

## 2021-09-13 LAB — GLUCOSE, CAPILLARY: Glucose-Capillary: 110 mg/dL — ABNORMAL HIGH (ref 70–99)

## 2021-09-13 MED ORDER — FLUDEOXYGLUCOSE F - 18 (FDG) INJECTION
8.2000 | Freq: Once | INTRAVENOUS | Status: AC | PRN
  Administered 2021-09-13: 8 via INTRAVENOUS

## 2021-09-17 ENCOUNTER — Encounter: Payer: Self-pay | Admitting: *Deleted

## 2021-09-17 ENCOUNTER — Other Ambulatory Visit (HOSPITAL_COMMUNITY): Payer: Self-pay

## 2021-09-17 DIAGNOSIS — Z20822 Contact with and (suspected) exposure to covid-19: Secondary | ICD-10-CM | POA: Diagnosis not present

## 2021-09-17 NOTE — Progress Notes (Signed)
Xeloda/Temodar refills declined per Dr. [name] due to progression on PET. Will see patient in office on 2/14

## 2021-09-18 ENCOUNTER — Inpatient Hospital Stay: Payer: Medicare Other

## 2021-09-18 ENCOUNTER — Encounter: Payer: Self-pay | Admitting: Nurse Practitioner

## 2021-09-18 ENCOUNTER — Other Ambulatory Visit: Payer: Self-pay

## 2021-09-18 ENCOUNTER — Inpatient Hospital Stay (HOSPITAL_BASED_OUTPATIENT_CLINIC_OR_DEPARTMENT_OTHER): Payer: Medicare Other | Admitting: Nurse Practitioner

## 2021-09-18 ENCOUNTER — Inpatient Hospital Stay: Payer: Medicare Other | Attending: Oncology

## 2021-09-18 VITALS — BP 126/82 | HR 74 | Temp 98.7°F | Resp 20 | Ht 70.0 in | Wt 165.8 lb

## 2021-09-18 DIAGNOSIS — D3A8 Other benign neuroendocrine tumors: Secondary | ICD-10-CM

## 2021-09-18 DIAGNOSIS — C7A8 Other malignant neuroendocrine tumors: Secondary | ICD-10-CM | POA: Diagnosis not present

## 2021-09-18 DIAGNOSIS — C7B8 Other secondary neuroendocrine tumors: Secondary | ICD-10-CM | POA: Insufficient documentation

## 2021-09-18 LAB — CMP (CANCER CENTER ONLY)
ALT: 17 U/L (ref 0–44)
AST: 26 U/L (ref 15–41)
Albumin: 4.4 g/dL (ref 3.5–5.0)
Alkaline Phosphatase: 72 U/L (ref 38–126)
Anion gap: 7 (ref 5–15)
BUN: 35 mg/dL — ABNORMAL HIGH (ref 8–23)
CO2: 33 mmol/L — ABNORMAL HIGH (ref 22–32)
Calcium: 9.9 mg/dL (ref 8.9–10.3)
Chloride: 97 mmol/L — ABNORMAL LOW (ref 98–111)
Creatinine: 1.24 mg/dL (ref 0.61–1.24)
GFR, Estimated: >60 mL/min (ref >60)
Glucose, Bld: 104 mg/dL — ABNORMAL HIGH (ref 70–99)
Potassium: 4.1 mmol/L (ref 3.5–5.1)
Sodium: 137 mmol/L (ref 135–145)
Total Bilirubin: 1.3 mg/dL — ABNORMAL HIGH (ref 0.3–1.2)
Total Protein: 7 g/dL (ref 6.5–8.1)

## 2021-09-18 LAB — CBC WITH DIFFERENTIAL (CANCER CENTER ONLY)
Abs Immature Granulocytes: 0 10*3/uL (ref 0.00–0.07)
Basophils Absolute: 0 10*3/uL (ref 0.0–0.1)
Basophils Relative: 0 %
Eosinophils Absolute: 0.1 10*3/uL (ref 0.0–0.5)
Eosinophils Relative: 3 %
HCT: 39.8 % (ref 39.0–52.0)
Hemoglobin: 13.3 g/dL (ref 13.0–17.0)
Immature Granulocytes: 0 %
Lymphocytes Relative: 8 %
Lymphs Abs: 0.4 10*3/uL — ABNORMAL LOW (ref 0.7–4.0)
MCH: 34.4 pg — ABNORMAL HIGH (ref 26.0–34.0)
MCHC: 33.4 g/dL (ref 30.0–36.0)
MCV: 102.8 fL — ABNORMAL HIGH (ref 80.0–100.0)
Monocytes Absolute: 0.7 10*3/uL (ref 0.1–1.0)
Monocytes Relative: 15 %
Neutro Abs: 3.5 10*3/uL (ref 1.7–7.7)
Neutrophils Relative %: 74 %
Platelet Count: 104 10*3/uL — ABNORMAL LOW (ref 150–400)
RBC: 3.87 MIL/uL — ABNORMAL LOW (ref 4.22–5.81)
RDW: 14.4 % (ref 11.5–15.5)
WBC Count: 4.7 10*3/uL (ref 4.0–10.5)
nRBC: 0 % (ref 0.0–0.2)

## 2021-09-18 MED ORDER — OCTREOTIDE ACETATE 30 MG IM KIT
30.0000 mg | PACK | Freq: Once | INTRAMUSCULAR | Status: AC
  Administered 2021-09-18: 30 mg via INTRAMUSCULAR
  Filled 2021-09-18: qty 1

## 2021-09-18 NOTE — Progress Notes (Signed)
[place] OFFICE PROGRESS NOTE   Diagnosis: Pancreas neuroendocrine tumor  INTERVAL HISTORY:   Ronald Thompson returns as scheduled.  He continues monthly Sandostatin.  He completed another cycle of Xeloda/temozolomide beginning 08/20/2021.  He denies nausea/vomiting.  No mouth sores.  No diarrhea.  No rash.  No flushing episodes.  He denies pain.  He describes his appetite as "okay".  Weight is stable.  Objective:  Vital signs in last 24 hours:  Blood pressure 126/82, pulse 74, temperature 98.7 F (37.1 C), temperature source Oral, resp. rate 20, height 5\' 10"  (1.778 m), weight 165 lb 12.8 oz (75.2 kg), SpO2 100 %.    HEENT: No thrush or ulcers. Resp: Lungs clear bilaterally. Cardio: Regular rate and rhythm. GI: Liver is enlarged, palpable to the left of the umbilicus. Vascular: No leg edema. Skin: Palms with hyperpigmentation, skin thickening.  No skin breakdown.   Lab Results:  Lab Results  Component Value Date   WBC 4.7 09/18/2021   HGB 13.3 09/18/2021   HCT 39.8 09/18/2021   MCV 102.8 (H) 09/18/2021   PLT 104 (L) 09/18/2021   NEUTROABS 3.5 09/18/2021    Imaging:  No results found.  Medications: I have reviewed the patient's current medications.  Assessment/Plan: Pancreatic neuroendocrine tumor, WHO grade 2, pancreatic head mass and small peripancreatic/celiac nodes on a CT 02/04/2014 EUS revealed evidence of multiple liver metastases-status post an FNA biopsy of a left liver lesion 02/10/2014 confirming a neuroendocrine tumor   Octreotide scan 04/01/2014 with multiple foci of metastatic neuroendocrine tumor in the liver Monthly Sandostatin started 03/16/2014 Restaging CT 07/04/2014 with resolution of a previously noted pancreas head lesion and probable progression of liver metastases Restaging CT 10/31/2014-stable liver lesions, no evidence of a pancreas mass, stable. Restaging CT 02/28/2015-stable hepatic metastases, stable pancreas lesion  unchanged Restaging CT 08/30/2015-stable pancreas mass, slight enlargement of hepatic metastases Monthly Sandostatin continued Restaging CTs 01/17/2016 revealed enlargement of liver lesions, no new lesions, enlargement of the pancreas head mass Gallium DOTATATE scan 10/01/2016 confirmed uptake in the liver metastases, pancreas primary, peripancreatic adenopathy, and left iliac bone Cycle 1 Lutathera 03/05/2017 Cycle 2 Lutathera 04/30/2017 Cycle 3 Lutathera 07/02/2017 Cycle 4 Lutathera 08/27/2017 Netspot scan 09/29/2017-decreased radiotracer activity within the pancreas head, peripancreatic lymph node, and solitary skeletal lesion.  Liver lesions have increased in size with a decrease in radiotracer activity Monthly Sandostatin continued Netspot scan 05/20/2018-stable to decreased size of liver lesions, most have decreased SUV, similar uptake in the pancreas lesion, previously noted peripancreatic lymph node has resolved him a mild decrease in uptake associated with a left iliac metastasis, no evidence of disease progression Monthly Sandostatin continued Netspot scan 01/29/2019- overall stable to improved, 1 lesion left hepatic lobe has increased tracer activity-unchanged in size, majority of hepatic lesions have reduced activity compared to the pretreatment scan, decreased activity left iliac bone lesion, stable activity in the head of the pancreas lesion, no new lesions Monthly Sandostatin continued Netspot on September 24, 2019-increase in size of 3 liver lesions with associated stable radiotracer activity, no new lesions, stable small left iliac lesion Cycle 1 salvage Lutathera 10/27/2019 Cycle 2 salvage Lutathera 12/22/2019 Netspot on 02/15/2020-decrease in radiotracer activity in the liver metastases with a decrease in size of several lesions, no new lesions, no progressive disease in the bowel or mesentery, decreased activity in the left iliac bone lesion Monthly Sandostatin continued  Netspot  10/13/2020-decrease in radiotracer activity in the left and right liver, decreased size of liver lesions, one lesion in  the superior right liver has increased in size and has no radiotracer activity, no evidence of metastatic disease outside the liver PET 11/15/2020- moderate hypermetabolic activity associated with several enlarging liver lesions that had limited activity on the dotatate PET, a lesion in the left lateral hepatic lobe with mild persistent dotatate activity has mild peripheral FDG activity and is decreased in size from a dotatate PET 1 year ago.  No hypermetabolic activity in the pancreas or bowel.  No hypermetabolic activity in the previous dotatate avid left iliac lesion Monthly Sandostatin continued Cycle 1 capecitabine/temozolomide 01/22/2021 (capecitabine twice daily days 1 through 14 of each 28-day cycle/temozolomide days 10 through 14) Cycle 2 capecitabine/temozolomide 02/19/2021 Cycle 3 capecitabine/temozolomide 03/19/2021 Cycle 4 capecitabine/temozolomide 04/17/2021 PET 05/11/2021-slight increase in size of several FDG avid liver lesions, no new hepatic lesions, no evidence of metastatic disease to the chest or bones Cycle 5 capecitabine/temozolomide 05/16/2021 Cycle 6 capecitabine/temozolomide 06/25/2021, capecitabine dose reduced Cycle 7 capecitabine/temozolomide 07/23/2021 Cycle 8 capecitabine/temozolomide 08/20/2021 PET 09/13/2021-mildly progressive hepatic metastases    Chest Seminoma in 1990 treated with BEP and chest radiation at [facility] 3. 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 chronic exertional dyspnea following treatment for the seminoma   5. aortic stenosis on an echocardiogram December 2011, severe aortic stenosis on echocardiogram 12/06/2015, status post TAVR on 04/16/2016 6. lumbar disc surgery 2014   7. acute  abdominal pain 02/04/2014-resolved   8.  Diabetes 9.  COVID-19 infection July 2022    Disposition: Ronald Thompson appears unchanged.  He has completed 8 cycles of capecitabine/temozolomide.  Recent restaging PET scan shows progression of liver metastases.  Dr. [name] reviewed the results/images with Ronald Thompson at today's visit.  Capecitabine/temozolomide will be discontinued.  Dr. [name] discussed FOLFOX or a tyrosine kinase inhibitor.  Mr. Human would like to avoid having a Port-A-Cath placed if possible, prefers an oral regimen.  Dr. [name] will discuss further with Dr. Leamon Arnt.  He will continue monthly Sandostatin, injection today.  We scheduled a return visit in 4 weeks.  We will make arrangements for a sooner appointment pending Dr. Gearldine Shown discussion with Dr. Leamon Arnt.  Patient seen with Dr. Luberta Robertson ANP/GNP-BC   09/18/2021  8:42 AM  This was a shared visit with Ned Card.  Ronald Thompson was interviewed and examined.  We reviewed the restaging PET findings and images with him.  The chromogranin a level is higher.  We discussed salvage systemic treatment options including FOLFOX and tyrosine kinase inhibitor therapy.  Mr. Kowal does not wish to begin FOLFOX at present.  I will consult with Dr. Leamon Arnt regarding other treatment options.  We will plan to initiate salvage systemic therapy within the next 1-2 weeks.  He continues monthly Sandostatin.  I was present for greater than 50% of today's visit.  I performed medical decision making.  [name], MD

## 2021-09-18 NOTE — Patient Instructions (Signed)
Octreotide injection solution What is this medication? OCTREOTIDE (ok TREE oh tide) is used to reduce blood levels of growth hormone in patients with a condition called acromegaly. This medicine also reduces flushing and watery diarrhea caused by certain types of cancer. This medicine may be used for other purposes; ask your health care provider or pharmacist if you have questions. COMMON BRAND NAME(S): Bynfezia, Sandostatin What should I tell my care team before I take this medication? They need to know if you have any of these conditions: diabetes gallbladder disease kidney disease liver disease thyroid disease an unusual or allergic reaction to octreotide, other medicines, foods, dyes, or preservatives pregnant or trying to get pregnant breast-feeding How should I use this medication? This medicine is for injection under the skin or into a vein (only in emergency situations). It is usually given by a health care professional in a hospital or clinic setting. If you get this medicine at home, you will be taught how to prepare and give this medicine. Allow the injection solution to come to room temperature before use. Do not warm it artificially. Use exactly as directed. Take your medicine at regular intervals. Do not take your medicine more often than directed. It is important that you put your used needles and syringes in a special sharps container. Do not put them in a trash can. If you do not have a sharps container, call your pharmacist or healthcare provider to get one. Talk to your pediatrician regarding the use of this medicine in children. Special care may be needed. Overdosage: If you think you have taken too much of this medicine contact a poison control center or emergency room at once. NOTE: This medicine is only for you. Do not share this medicine with others. What if I miss a dose? If you miss a dose, take it as soon as you can. If it is almost time for your next dose, take only  that dose. Do not take double or extra doses. What may interact with this medication? bromocriptine certain medicines for blood pressure, heart disease, irregular heartbeat cyclosporine diuretics medicines for diabetes, including insulin quinidine This list may not describe all possible interactions. Give your health care provider a list of all the medicines, herbs, non-prescription drugs, or dietary supplements you use. Also tell them if you smoke, drink alcohol, or use illegal drugs. Some items may interact with your medicine. What should I watch for while using this medication? Visit your doctor or health care professional for regular checks on your progress. To help reduce irritation at the injection site, use a different site for each injection and make sure the solution is at room temperature before use. This medicine may cause decreases in blood sugar. Signs of low blood sugar include chills, cool, pale skin or cold sweats, drowsiness, extreme hunger, fast heartbeat, headache, nausea, nervousness or anxiety, shakiness, trembling, unsteadiness, tiredness, or weakness. Contact your doctor or health care professional right away if you experience any of these symptoms. This medicine may increase blood sugar. Ask your healthcare provider if changes in diet or medicines are needed if you have diabetes. This medicine may cause a decrease in vitamin B12. You should make sure that you get enough vitamin B12 while you are taking this medicine. Discuss the foods you eat and the vitamins you take with your health care professional. What side effects may I notice from receiving this medication? Side effects that you should report to your doctor or health care professional as soon as   possible: allergic reactions like skin rash, itching or hives, swelling of the face, lips, or tongue fast, slow, or irregular heartbeat right upper belly pain severe stomach pain signs and symptoms of high blood sugar such  as being more thirsty or hungry or having to urinate more than normal. You may also feel very tired or have blurry vision. signs and symptoms of low blood sugar such as feeling anxious; confusion; dizziness; increased hunger; unusually weak or tired; increased sweating; shakiness; cold, clammy skin; irritable; headache; blurred vision; fast heartbeat; loss of consciousness unusually weak or tired Side effects that usually do not require medical attention (report to your doctor or health care professional if they continue or are bothersome): diarrhea dizziness gas headache nausea, vomiting pain, redness, or irritation at site where injected upset stomach This list may not describe all possible side effects. Call your doctor for medical advice about side effects. You may report side effects to FDA at 1-800-FDA-1088. Where should I keep my medication? Keep out of the reach of children. Store in a refrigerator between 2 and 8 degrees C (36 and 46 degrees F). Protect from light. Allow to come to room temperature naturally. Do not use artificial heat. If protected from light, the injection may be stored at room temperature between 20 and 30 degrees C (70 and 86 degrees F) for 14 days. After the initial use, throw away any unused portion of a multiple dose vial after 14 days. Throw away unused portions of the ampules after use. NOTE: This sheet is a summary. It may not cover all possible information. If you have questions about this medicine, talk to your doctor, pharmacist, or health care provider.  2022 Elsevier/Gold Standard (2019-02-18 00:00:00)  

## 2021-09-18 NOTE — Progress Notes (Signed)
Patient seen by Lisa Thomas NP today  Vitals are within treatment parameters.  Labs reviewed by Lisa Thomas NP and are within treatment parameters.  Per physician team, patient is ready for treatment and there are NO modifications to the treatment plan.     

## 2021-09-19 ENCOUNTER — Other Ambulatory Visit: Payer: Self-pay | Admitting: *Deleted

## 2021-09-20 ENCOUNTER — Telehealth: Payer: Self-pay

## 2021-09-20 LAB — CHROMOGRANIN A: Chromogranin A (ng/mL): 21760 ng/mL — ABNORMAL HIGH (ref 0.0–101.8)

## 2021-09-20 NOTE — Telephone Encounter (Signed)
Patient called and left a message stated he  is waiting on the provider to call him to discuss his care. Place the remind on the provider desk to call the patient back.

## 2021-09-21 ENCOUNTER — Other Ambulatory Visit: Payer: Self-pay | Admitting: Oncology

## 2021-09-21 ENCOUNTER — Telehealth: Payer: Self-pay | Admitting: Oncology

## 2021-09-21 ENCOUNTER — Telehealth: Payer: Self-pay

## 2021-09-21 DIAGNOSIS — D3A8 Other benign neuroendocrine tumors: Secondary | ICD-10-CM

## 2021-09-21 DIAGNOSIS — Z5181 Encounter for therapeutic drug level monitoring: Secondary | ICD-10-CM

## 2021-09-21 MED ORDER — EVEROLIMUS 7.5 MG PO TABS
7.5000 mg | ORAL_TABLET | Freq: Every day | ORAL | 0 refills | Status: DC
Start: 2021-09-27 — End: 2021-11-23
  Filled 2021-09-21: qty 30, 30d supply, fill #0

## 2021-09-21 NOTE — Telephone Encounter (Signed)
TC to Pt per Dr [name] to inform Pt that he was in contact with Dr. Leamon Arnt and he suggested Everolimus to take everyday. Informed Pt that Dr [name] will call him later to go over the side effects of the drug. Pt verbalized understanding

## 2021-09-21 NOTE — Telephone Encounter (Signed)
I discussed the case with Dr. Leamon Arnt earlier today.  He recommends everolimus as the next treatment regimen.  I called Ronald Thompson.  I reviewed potential toxicities associated with everolimus.  He agrees to proceed.  The plan is to begin everolimus on 09/27/2021.  He will be contacted by [facility] pharmacy for further discussion regarding toxicities associated with everolimus and to arrange for drug delivery.

## 2021-09-22 ENCOUNTER — Other Ambulatory Visit (HOSPITAL_COMMUNITY): Payer: Self-pay

## 2021-09-24 ENCOUNTER — Other Ambulatory Visit (HOSPITAL_COMMUNITY): Payer: Self-pay

## 2021-09-24 ENCOUNTER — Telehealth: Payer: Self-pay | Admitting: Pharmacy Technician

## 2021-09-24 NOTE — Telephone Encounter (Signed)
Oral Oncology Patient Advocate Encounter  Prior Authorization for Everolimus has been approved.    PA# [id number] Effective dates: 09/24/21 through 08/04/22  Patients co-pay is $3652.41  Oral Oncology Clinic will continue to follow.   [place] Patient [place] Phone [phone] Fax [phone] 09/24/2021 3:15 PM

## 2021-09-24 NOTE — Telephone Encounter (Signed)
Oral Oncology Patient Advocate Encounter   Received notification from [facility] that prior authorization for Afinitor is required.   PA submitted on CoverMyMeds Key [id number] Status is pending   Oral Oncology Clinic will continue to follow.  [place] Patient [place] Phone [phone] Fax [phone] 09/24/2021 1:48 PM

## 2021-09-24 NOTE — Telephone Encounter (Signed)
Oral Oncology Patient Advocate Encounter  [employer] did not approve PA for Brand Afinitor.  I resubmitted the PA for the generic, everolimus.   PA submitted on CoverMyMeds Key [id number] Status is pending   Oral Oncology Clinic will continue to follow.  [place] Patient [place] Phone [phone] Fax [phone] 09/24/2021 3:15 PM

## 2021-09-25 ENCOUNTER — Telehealth: Payer: Self-pay | Admitting: Pharmacy Technician

## 2021-09-25 ENCOUNTER — Other Ambulatory Visit (HOSPITAL_COMMUNITY): Payer: Self-pay

## 2021-09-25 NOTE — Telephone Encounter (Signed)
Oral Oncology Patient Advocate Encounter  With the patients permission, I submitted an online application to Patient Assistance [facility]).  [employer]ANO will complete a benefits investigation and review the application submitted to determine if the patient qualifies for [employer] Patient [place].  Medication: Afinitor Confirmation number: [id number]  I will fax the completed Health Care Provider form to [phone] once signed by Dr [name].  PANO phone number is [phone].  [place] Patient [place] Phone [phone] Fax [phone] 09/25/2021 4:07 PM

## 2021-09-26 ENCOUNTER — Other Ambulatory Visit (HOSPITAL_COMMUNITY): Payer: Self-pay

## 2021-09-27 ENCOUNTER — Other Ambulatory Visit (HOSPITAL_COMMUNITY): Payer: Self-pay

## 2021-10-05 NOTE — Telephone Encounter (Signed)
Received fax notification that patient was approved for a free 14 day trial of Afinitor. Called patient and made him aware.  ? ?PANO application is still being processed.  I faxed over the initial denial and appeal determination to [facility] on 10/04/21. ? ?Dennison Nancy CPHT ?Specialty Pharmacy Patient Advocate ?[place] ?Phone [phone] ?Fax [phone] ?10/05/2021 10:23 AM ? ?

## 2021-10-16 ENCOUNTER — Other Ambulatory Visit: Payer: Self-pay

## 2021-10-16 ENCOUNTER — Inpatient Hospital Stay (HOSPITAL_BASED_OUTPATIENT_CLINIC_OR_DEPARTMENT_OTHER): Payer: Medicare Other | Admitting: Oncology

## 2021-10-16 ENCOUNTER — Encounter: Payer: Self-pay | Admitting: Oncology

## 2021-10-16 ENCOUNTER — Inpatient Hospital Stay: Payer: Medicare Other

## 2021-10-16 ENCOUNTER — Inpatient Hospital Stay: Payer: Medicare Other | Attending: Oncology

## 2021-10-16 VITALS — BP 137/80 | HR 84 | Temp 98.1°F | Resp 20 | Ht 70.0 in | Wt 165.8 lb

## 2021-10-16 DIAGNOSIS — C7A8 Other malignant neuroendocrine tumors: Secondary | ICD-10-CM | POA: Diagnosis not present

## 2021-10-16 DIAGNOSIS — Z5181 Encounter for therapeutic drug level monitoring: Secondary | ICD-10-CM

## 2021-10-16 DIAGNOSIS — C7B8 Other secondary neuroendocrine tumors: Secondary | ICD-10-CM | POA: Diagnosis not present

## 2021-10-16 DIAGNOSIS — D3A8 Other benign neuroendocrine tumors: Secondary | ICD-10-CM

## 2021-10-16 LAB — CMP (CANCER CENTER ONLY)
ALT: 20 U/L (ref 0–44)
AST: 34 U/L (ref 15–41)
Albumin: 4.7 g/dL (ref 3.5–5.0)
Alkaline Phosphatase: 79 U/L (ref 38–126)
Anion gap: 6 (ref 5–15)
BUN: 27 mg/dL — ABNORMAL HIGH (ref 8–23)
CO2: 36 mmol/L — ABNORMAL HIGH (ref 22–32)
Calcium: 10.5 mg/dL — ABNORMAL HIGH (ref 8.9–10.3)
Chloride: 97 mmol/L — ABNORMAL LOW (ref 98–111)
Creatinine: 1.27 mg/dL — ABNORMAL HIGH (ref 0.61–1.24)
GFR, Estimated: >60 mL/min (ref >60)
Glucose, Bld: 106 mg/dL — ABNORMAL HIGH (ref 70–99)
Potassium: 4.4 mmol/L (ref 3.5–5.1)
Sodium: 139 mmol/L (ref 135–145)
Total Bilirubin: 1 mg/dL (ref 0.3–1.2)
Total Protein: 7.3 g/dL (ref 6.5–8.1)

## 2021-10-16 LAB — CBC WITH DIFFERENTIAL (CANCER CENTER ONLY)
Abs Immature Granulocytes: 0.01 10*3/uL (ref 0.00–0.07)
Basophils Absolute: 0 10*3/uL (ref 0.0–0.1)
Basophils Relative: 1 %
Eosinophils Absolute: 0.1 10*3/uL (ref 0.0–0.5)
Eosinophils Relative: 3 %
HCT: 41.9 % (ref 39.0–52.0)
Hemoglobin: 14.1 g/dL (ref 13.0–17.0)
Immature Granulocytes: 0 %
Lymphocytes Relative: 11 %
Lymphs Abs: 0.5 10*3/uL — ABNORMAL LOW (ref 0.7–4.0)
MCH: 34.2 pg — ABNORMAL HIGH (ref 26.0–34.0)
MCHC: 33.7 g/dL (ref 30.0–36.0)
MCV: 101.7 fL — ABNORMAL HIGH (ref 80.0–100.0)
Monocytes Absolute: 0.8 10*3/uL (ref 0.1–1.0)
Monocytes Relative: 17 %
Neutro Abs: 3.1 10*3/uL (ref 1.7–7.7)
Neutrophils Relative %: 68 %
Platelet Count: 92 10*3/uL — ABNORMAL LOW (ref 150–400)
RBC: 4.12 MIL/uL — ABNORMAL LOW (ref 4.22–5.81)
RDW: 12.9 % (ref 11.5–15.5)
WBC Count: 4.5 10*3/uL (ref 4.0–10.5)
nRBC: 0 % (ref 0.0–0.2)

## 2021-10-16 LAB — MAGNESIUM: Magnesium: 2 mg/dL (ref 1.7–2.4)

## 2021-10-16 LAB — PHOSPHORUS: Phosphorus: 3.7 mg/dL (ref 2.5–4.6)

## 2021-10-16 LAB — PROTIME-INR
INR: 1 (ref 0.8–1.2)
Prothrombin Time: 13.2 seconds (ref 11.4–15.2)

## 2021-10-16 MED ORDER — OCTREOTIDE ACETATE 30 MG IM KIT
30.0000 mg | PACK | Freq: Once | INTRAMUSCULAR | Status: AC
  Administered 2021-10-16: 30 mg via INTRAMUSCULAR
  Filled 2021-10-16: qty 1

## 2021-10-16 NOTE — Patient Instructions (Signed)
Octreotide injection solution What is this medication? OCTREOTIDE (ok TREE oh tide) is used to reduce blood levels of growth hormone in patients with a condition called acromegaly. This medicine also reduces flushing and watery diarrhea caused by certain types of cancer. This medicine may be used for other purposes; ask your health care provider or pharmacist if you have questions. COMMON BRAND NAME(S): Bynfezia, Sandostatin What should I tell my care team before I take this medication? They need to know if you have any of these conditions: diabetes gallbladder disease kidney disease liver disease thyroid disease an unusual or allergic reaction to octreotide, other medicines, foods, dyes, or preservatives pregnant or trying to get pregnant breast-feeding How should I use this medication? This medicine is for injection under the skin or into a vein (only in emergency situations). It is usually given by a health care professional in a hospital or clinic setting. If you get this medicine at home, you will be taught how to prepare and give this medicine. Allow the injection solution to come to room temperature before use. Do not warm it artificially. Use exactly as directed. Take your medicine at regular intervals. Do not take your medicine more often than directed. It is important that you put your used needles and syringes in a special sharps container. Do not put them in a trash can. If you do not have a sharps container, call your pharmacist or healthcare provider to get one. Talk to your pediatrician regarding the use of this medicine in children. Special care may be needed. Overdosage: If you think you have taken too much of this medicine contact a poison control center or emergency room at once. NOTE: This medicine is only for you. Do not share this medicine with others. What if I miss a dose? If you miss a dose, take it as soon as you can. If it is almost time for your next dose, take only  that dose. Do not take double or extra doses. What may interact with this medication? bromocriptine certain medicines for blood pressure, heart disease, irregular heartbeat cyclosporine diuretics medicines for diabetes, including insulin quinidine This list may not describe all possible interactions. Give your health care provider a list of all the medicines, herbs, non-prescription drugs, or dietary supplements you use. Also tell them if you smoke, drink alcohol, or use illegal drugs. Some items may interact with your medicine. What should I watch for while using this medication? Visit your doctor or health care professional for regular checks on your progress. To help reduce irritation at the injection site, use a different site for each injection and make sure the solution is at room temperature before use. This medicine may cause decreases in blood sugar. Signs of low blood sugar include chills, cool, pale skin or cold sweats, drowsiness, extreme hunger, fast heartbeat, headache, nausea, nervousness or anxiety, shakiness, trembling, unsteadiness, tiredness, or weakness. Contact your doctor or health care professional right away if you experience any of these symptoms. This medicine may increase blood sugar. Ask your healthcare provider if changes in diet or medicines are needed if you have diabetes. This medicine may cause a decrease in vitamin B12. You should make sure that you get enough vitamin B12 while you are taking this medicine. Discuss the foods you eat and the vitamins you take with your health care professional. What side effects may I notice from receiving this medication? Side effects that you should report to your doctor or health care professional as soon as   possible: allergic reactions like skin rash, itching or hives, swelling of the face, lips, or tongue fast, slow, or irregular heartbeat right upper belly pain severe stomach pain signs and symptoms of high blood sugar such  as being more thirsty or hungry or having to urinate more than normal. You may also feel very tired or have blurry vision. signs and symptoms of low blood sugar such as feeling anxious; confusion; dizziness; increased hunger; unusually weak or tired; increased sweating; shakiness; cold, clammy skin; irritable; headache; blurred vision; fast heartbeat; loss of consciousness unusually weak or tired Side effects that usually do not require medical attention (report to your doctor or health care professional if they continue or are bothersome): diarrhea dizziness gas headache nausea, vomiting pain, redness, or irritation at site where injected upset stomach This list may not describe all possible side effects. Call your doctor for medical advice about side effects. You may report side effects to FDA at 1-800-FDA-1088. Where should I keep my medication? Keep out of the reach of children. Store in a refrigerator between 2 and 8 degrees C (36 and 46 degrees F). Protect from light. Allow to come to room temperature naturally. Do not use artificial heat. If protected from light, the injection may be stored at room temperature between 20 and 30 degrees C (70 and 86 degrees F) for 14 days. After the initial use, throw away any unused portion of a multiple dose vial after 14 days. Throw away unused portions of the ampules after use. NOTE: This sheet is a summary. It may not cover all possible information. If you have questions about this medicine, talk to your doctor, pharmacist, or health care provider.  2022 Elsevier/Gold Standard (2019-02-18 00:00:00)  

## 2021-10-16 NOTE — Progress Notes (Signed)
?[facility] ?OFFICE PROGRESS NOTE ? ? ?Diagnosis: Pancreas neuroendocrine tumor ? ?INTERVAL HISTORY:  ? ?Ronald Thompson returns as scheduled.  He began everolimus on 10/11/2021.  There was a delay due to lack of insurance coverage.  He was able to obtain a 2-week trial supply from the pharmaceutical company.  He is waiting to hear on an additional supply. ?No nausea, mouth sores, or rash.  He has light-colored stool. ? ?Objective: ? ?Vital signs in last 24 hours: ? ?Blood pressure 137/80, pulse 84, temperature 98.1 ?F (36.7 ?C), resp. rate 20, height '5\' 10"'$  (1.778 m), weight 165 lb 12.8 oz (75.2 kg), SpO2 98 %. ?  ? ?HEENT: No thrush or ulcers ?Resp: Lungs clear bilaterally ?Cardio: Regular rate and rhythm ?GI: Fullness in the right upper abdomen ?Vascular: No leg edema ? ? ?Lab Results: ? ?Lab Results  ?Component Value Date  ? WBC 4.5 10/16/2021  ? HGB 14.1 10/16/2021  ? HCT 41.9 10/16/2021  ? MCV 101.7 (H) 10/16/2021  ? PLT 92 (L) 10/16/2021  ? NEUTROABS 3.1 10/16/2021  ? ? ?CMP  ?Lab Results  ?Component Value Date  ? NA 137 09/18/2021  ? K 4.1 09/18/2021  ? CL 97 (L) 09/18/2021  ? CO2 33 (H) 09/18/2021  ? GLUCOSE 104 (H) 09/18/2021  ? BUN 35 (H) 09/18/2021  ? CREATININE 1.24 09/18/2021  ? CALCIUM 9.9 09/18/2021  ? PROT 7.0 09/18/2021  ? ALBUMIN 4.4 09/18/2021  ? AST 26 09/18/2021  ? ALT 17 09/18/2021  ? ALKPHOS 72 09/18/2021  ? BILITOT 1.3 (H) 09/18/2021  ? GFRNONAA >60 09/18/2021  ? GFRAA >60 01/19/2020  ? ? ?No results found for: CEA1, CEA, [id number], CA125 ? ?Lab Results  ?Component Value Date  ? INR 1.0 10/16/2021  ? LABPROT 13.2 10/16/2021  ? ? ?Imaging: ? ?No results found. ? ?Medications: I have reviewed the patient's current medications. ? ? ?Assessment/Plan: ?Pancreatic neuroendocrine tumor, WHO grade 2, pancreatic head mass and small peripancreatic/celiac nodes on a CT 02/04/2014 ?EUS revealed evidence of multiple liver metastases-status post an FNA biopsy of a left liver lesion 02/10/2014  confirming a neuroendocrine tumor   ?Octreotide scan 04/01/2014 with multiple foci of metastatic neuroendocrine tumor in the liver ?Monthly Sandostatin started 03/16/2014 ?Restaging CT 07/04/2014 with resolution of a previously noted pancreas head lesion and probable progression of liver metastases ?Restaging CT 10/31/2014-stable liver lesions, no evidence of a pancreas mass, stable. ?Restaging CT 02/28/2015-stable hepatic metastases, stable pancreas lesion unchanged ?Restaging CT 08/30/2015-stable pancreas mass, slight enlargement of hepatic metastases ?Monthly Sandostatin continued ?Restaging CTs 01/17/2016 revealed enlargement of liver lesions, no new lesions, enlargement of the pancreas head mass ?Gallium DOTATATE scan 10/01/2016 confirmed uptake in the liver metastases, pancreas primary, peripancreatic adenopathy, and left iliac bone ?Cycle 1 Lutathera 03/05/2017 ?Cycle 2 Lutathera 04/30/2017 ?Cycle 3 Lutathera 07/02/2017 ?Cycle 4 Lutathera 08/27/2017 ?Netspot scan 09/29/2017-decreased radiotracer activity within the pancreas head, peripancreatic lymph node, and solitary skeletal lesion.  Liver lesions have increased in size with a decrease in radiotracer activity ?Monthly Sandostatin continued ?Netspot scan 05/20/2018-stable to decreased size of liver lesions, most have decreased SUV, similar uptake in the pancreas lesion, previously noted peripancreatic lymph node has resolved him a mild decrease in uptake associated with a left iliac metastasis, no evidence of disease progression ?Monthly Sandostatin continued ?Netspot scan 01/29/2019- overall stable to improved, 1 lesion left hepatic lobe has increased tracer activity-unchanged in size, majority of hepatic lesions have reduced activity compared to the pretreatment scan, decreased activity left  iliac bone lesion, stable activity in the head of the pancreas lesion, no new lesions ?Monthly Sandostatin continued ?Netspot on September 24, 2019-increase in size of 3  liver lesions with associated stable radiotracer activity, no new lesions, stable small left iliac lesion ?Cycle 1 salvage Lutathera 10/27/2019 ?Cycle 2 salvage Lutathera 12/22/2019 ?Netspot on 02/15/2020-decrease in radiotracer activity in the liver metastases with a decrease in size of several lesions, no new lesions, no progressive disease in the bowel or mesentery, decreased activity in the left iliac bone lesion ?Monthly Sandostatin continued  ?Netspot 10/13/2020-decrease in radiotracer activity in the left and right liver, decreased size of liver lesions, one lesion in the superior right liver has increased in size and has no radiotracer activity, no evidence of metastatic disease outside the liver ?PET 11/15/2020- moderate hypermetabolic activity associated with several enlarging liver lesions that had limited activity on the dotatate PET, a lesion in the left lateral hepatic lobe with mild persistent dotatate activity has mild peripheral FDG activity and is decreased in size from a dotatate PET 1 year ago.  No hypermetabolic activity in the pancreas or bowel.  No hypermetabolic activity in the previous dotatate avid left iliac lesion ?Monthly Sandostatin continued ?Cycle 1 capecitabine/temozolomide 01/22/2021 (capecitabine twice daily days 1 through 14 of each 28-day cycle/temozolomide days 10 through 14) ?Cycle 2 capecitabine/temozolomide 02/19/2021 ?Cycle 3 capecitabine/temozolomide 03/19/2021 ?Cycle 4 capecitabine/temozolomide 04/17/2021 ?PET 05/11/2021-slight increase in size of several FDG avid liver lesions, no new hepatic lesions, no evidence of metastatic disease to the chest or bones ?Cycle 5 capecitabine/temozolomide 05/16/2021 ?Cycle 6 capecitabine/temozolomide 06/25/2021, capecitabine dose reduced ?Cycle 7 capecitabine/temozolomide 07/23/2021 ?Cycle 8 capecitabine/temozolomide 08/20/2021 ?PET 09/13/2021-mildly progressive hepatic metastases ?Everolimus 10/11/2021 ?   ?Chest Seminoma in 1990 treated with BEP and  chest radiation at [facility] ?3. 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 chronic exertional dyspnea following treatment for the seminoma   ?5. aortic stenosis on an echocardiogram December 2011, severe aortic stenosis on echocardiogram 12/06/2015, status post TAVR on 04/16/2016 ?6. lumbar disc surgery 2014   ?7. acute abdominal pain 02/04/2014-resolved   ?8.  Diabetes ?9.  COVID-19 infection July 2022 ? ? ?Disposition: ?Ronald Thompson appears unchanged.  He is tolerating the everolimus well today.  The plan is to continue everolimus.  We are checking with pharmacy regarding the cost to obtain additional everolimus.  If he is not able to obtain everolimus for a reasonable cost we will consider changing to sunitinib or FOLFOX. ? ?Ronald Thompson return for an office and lab visit in 2 weeks.  The calcium level is mildly elevated today.  We will check this again when he returns in 2 weeks. ? ?He will receive Sandostatin today. ? ?Betsy Coder, MD ? ?10/16/2021  ?8:47 AM ? ? ? ?

## 2021-10-16 NOTE — Progress Notes (Signed)
Patient seen by Dr. [name] today ? ?Vitals are within treatment parameters. ? ?Labs reviewed by Dr. [name] and are within treatment parameters. ? ?Per physician team, patient is ready for treatment and there are NO modifications to the treatment plan.  ? ? ? ?From [employer] [facility] in regard to Everolimus prescription ? ? We just got a fax yesterday that they benefits investigation step for their program was complete. It should move on to the next step after which would be approval ?

## 2021-10-17 NOTE — Telephone Encounter (Signed)
Oral Oncology Patient Advocate Encounter ? ?Received notification from [name] that patient has been denied enrollment into their program to receive Afinitor from the drug manufacturer due to income exceeding program limitations. ? ?I called and spoke with Ronald Thompson and he is aware of the determination. ? ?Patient knows to call the office with questions or concerns. ?  ?Oral Oncology Clinic will continue to follow. ? ?Dennison Nancy CPHT ?Specialty Pharmacy Patient Advocate ?[place] ?Phone [phone] ?Fax [phone] ?10/17/2021 4:03 PM ? ?

## 2021-10-17 NOTE — Telephone Encounter (Signed)
Called [facility] to check application status and patient has been referred to [employer]. I will follow up with [employer] to check application status. ? ?Dennison Nancy CPHT ?Specialty Pharmacy Patient Advocate ?[place] ?Phone [phone] ?Fax [phone] ?10/17/2021 12:14 PM ? ?

## 2021-10-18 ENCOUNTER — Other Ambulatory Visit (HOSPITAL_COMMUNITY): Payer: Self-pay

## 2021-10-18 ENCOUNTER — Encounter: Payer: Self-pay | Admitting: *Deleted

## 2021-10-18 ENCOUNTER — Telehealth: Payer: Self-pay | Admitting: Pharmacy Technician

## 2021-10-18 NOTE — Progress Notes (Signed)
PATIENT NAVIGATOR PROGRESS NOTE ? ?Name: Ronald Thompson ?Date: 10/18/2021 ?MRN: [id number]  ?DOB: [birth year] ? ? ?Reason for visit:  ?F/U after visit with Dr [name] ? ?Comments:  Called [name] to discuss prohibitive costs of Affinitor and sutent therapies. ?[name] would like to proceed with FOLFOX therapy that he and Dr [name] discussed. ?At his appt on 3/20 [name] will discuss FOLFOX therapy in detail and then we will proceed with getting PAC placed. ? ? ? ? ? ?Time spent counseling/coordinating care: 15-30 minutes ? ?

## 2021-10-18 NOTE — Telephone Encounter (Signed)
Oral Oncology Patient Advocate Encounter ? ?Prior Authorization for Sunitinib has been approved.   ? ?PA# [id number] ?Effective dates: 08/05/21 through 08/04/22 ? ?Patients co-pay is $4249.33.   ? ?Patient does not qualify for grants due to his income and there is no patient assistance available for brand or generic drug. ? ?Oral Oncology Clinic will continue to follow.  ? ?Dennison Nancy CPHT ?Specialty Pharmacy Patient Advocate ?[place] ?Phone [phone] ?Fax [phone] ?10/18/2021 11:02 AM ? ?

## 2021-10-18 NOTE — Telephone Encounter (Signed)
Oral Oncology Patient Advocate Encounter ?  ?Received notification from [facility] that prior authorization for Sunitinib is required. ?  ?PA submitted on CoverMyMeds ?Key [id number] ?Status is pending ?  ?Oral Oncology Clinic will continue to follow. ? ?Dennison Nancy CPHT ?Specialty Pharmacy Patient Advocate ?[place] ?Phone [phone] ?Fax [phone] ?10/18/2021 11:01 AM ? ?

## 2021-10-23 ENCOUNTER — Ambulatory Visit: Payer: Medicare Other | Admitting: Internal Medicine

## 2021-10-23 ENCOUNTER — Telehealth: Payer: Self-pay

## 2021-10-23 NOTE — Telephone Encounter (Signed)
Patient called and left a message to have the nurse to call him back. ?Called and spoke with the patient he stated he received a letter from his insurance company about been approved for treatment. Advise the patient to bring the letter with him to his next schedule appointment and we will go over with him. Patient gave verbal understanding and denied further questions ?

## 2021-10-29 DIAGNOSIS — Z20822 Contact with and (suspected) exposure to covid-19: Secondary | ICD-10-CM | POA: Diagnosis not present

## 2021-11-01 ENCOUNTER — Inpatient Hospital Stay (HOSPITAL_BASED_OUTPATIENT_CLINIC_OR_DEPARTMENT_OTHER): Payer: Medicare Other | Admitting: Oncology

## 2021-11-01 ENCOUNTER — Inpatient Hospital Stay: Payer: Medicare Other

## 2021-11-01 ENCOUNTER — Encounter: Payer: Self-pay | Admitting: *Deleted

## 2021-11-01 VITALS — BP 135/79 | HR 86 | Temp 97.8°F | Resp 19 | Ht 70.0 in | Wt 166.6 lb

## 2021-11-01 DIAGNOSIS — D3A8 Other benign neuroendocrine tumors: Secondary | ICD-10-CM | POA: Diagnosis not present

## 2021-11-01 DIAGNOSIS — C7A8 Other malignant neuroendocrine tumors: Secondary | ICD-10-CM | POA: Diagnosis not present

## 2021-11-01 DIAGNOSIS — C7B8 Other secondary neuroendocrine tumors: Secondary | ICD-10-CM | POA: Diagnosis not present

## 2021-11-01 DIAGNOSIS — Z7189 Other specified counseling: Secondary | ICD-10-CM

## 2021-11-01 LAB — CBC WITH DIFFERENTIAL (CANCER CENTER ONLY)
Abs Immature Granulocytes: 0 10*3/uL (ref 0.00–0.07)
Basophils Absolute: 0 10*3/uL (ref 0.0–0.1)
Basophils Relative: 1 %
Eosinophils Absolute: 0.1 10*3/uL (ref 0.0–0.5)
Eosinophils Relative: 2 %
HCT: 38.1 % — ABNORMAL LOW (ref 39.0–52.0)
Hemoglobin: 12.8 g/dL — ABNORMAL LOW (ref 13.0–17.0)
Immature Granulocytes: 0 %
Lymphocytes Relative: 10 %
Lymphs Abs: 0.4 10*3/uL — ABNORMAL LOW (ref 0.7–4.0)
MCH: 33.1 pg (ref 26.0–34.0)
MCHC: 33.6 g/dL (ref 30.0–36.0)
MCV: 98.4 fL (ref 80.0–100.0)
Monocytes Absolute: 0.8 10*3/uL (ref 0.1–1.0)
Monocytes Relative: 18 %
Neutro Abs: 3 10*3/uL (ref 1.7–7.7)
Neutrophils Relative %: 69 %
Platelet Count: 145 10*3/uL — ABNORMAL LOW (ref 150–400)
RBC: 3.87 MIL/uL — ABNORMAL LOW (ref 4.22–5.81)
RDW: 12.5 % (ref 11.5–15.5)
WBC Count: 4.3 10*3/uL (ref 4.0–10.5)
nRBC: 0 % (ref 0.0–0.2)

## 2021-11-01 LAB — CMP (CANCER CENTER ONLY)
ALT: 17 U/L (ref 0–44)
AST: 29 U/L (ref 15–41)
Albumin: 4.2 g/dL (ref 3.5–5.0)
Alkaline Phosphatase: 73 U/L (ref 38–126)
Anion gap: 8 (ref 5–15)
BUN: 28 mg/dL — ABNORMAL HIGH (ref 8–23)
CO2: 32 mmol/L (ref 22–32)
Calcium: 10.1 mg/dL (ref 8.9–10.3)
Chloride: 98 mmol/L (ref 98–111)
Creatinine: 1.36 mg/dL — ABNORMAL HIGH (ref 0.61–1.24)
GFR, Estimated: 57 mL/min — ABNORMAL LOW (ref >60)
Glucose, Bld: 103 mg/dL — ABNORMAL HIGH (ref 70–99)
Potassium: 4.1 mmol/L (ref 3.5–5.1)
Sodium: 138 mmol/L (ref 135–145)
Total Bilirubin: 0.9 mg/dL (ref 0.3–1.2)
Total Protein: 7.2 g/dL (ref 6.5–8.1)

## 2021-11-01 LAB — MAGNESIUM: Magnesium: 1.7 mg/dL (ref 1.7–2.4)

## 2021-11-01 LAB — PHOSPHORUS: Phosphorus: 3.8 mg/dL (ref 2.5–4.6)

## 2021-11-01 NOTE — Progress Notes (Signed)
START OFF PATHWAY REGIMEN - Neuroendocrine ? ? ?[id number] (Leucovorin IV D1 + Fluorouracil IV D1/CIV D1,2 + Oxaliplatin IV D1) q14 Days: ?  A cycle is every 14 days: ?    Oxaliplatin  ?    Leucovorin  ?    Fluorouracil  ?    Fluorouracil  ? ?**Always confirm dose/schedule in your pharmacy ordering system** ? ?Patient Characteristics: ?Pancreas, Poorly-differentiated, Second Line and Beyond ?Tumor Location: Pancreas ?AJCC M Category: Staged < 8th Ed. ?AJCC 8 Stage Grouping: Staged < 8th Ed. ?AJCC T Category: Staged < 8th Ed. ?AJCC N Category: Staged < 8th Ed. ?Line of therapy: Second Line and Beyond ? ?Intent of Therapy: ?Non-Curative / Palliative Intent, Discussed with Patient ?

## 2021-11-01 NOTE — Progress Notes (Signed)
Established pt of Dr Ronald Thompson and he will begin FOLFOX on May 10.  PAC insertion scheduled with IR Elvina Sidle on May 8 at 0730, instructions given to patient.   ? ?He will RTC on 4/18 for office visit and Patient education sessionPATIENT NAVIGATOR PROGRESS NOTE ? ?Name: Ronald Thompson ?Date: 11/01/2021 ?MRN: [id number]  ?DOB: 05-23-1956 ? ? ?Reason for visit:  ?Established pt with Dr Ronald Thompson ? ?Comments:  Established pt of Dr Ronald Thompson and he will begin FOLFOX on May 10.  PAC insertion scheduled with IR Elvina Sidle on May 8 at 0730, instructions given to patient.   ? ?He will RTC on 4/18 for office visit and Patient education session ? ? ? ?Time spent counseling/coordinating care: 30-45 minutes ? ?

## 2021-11-01 NOTE — Progress Notes (Signed)
?[place] ?OFFICE PROGRESS NOTE ? ? ?Diagnosis: Pancreas neuroendocrine tumor ? ?INTERVAL HISTORY:  ? ?[name] returns as scheduled.  He took everolimus for 2 weeks.  He then discontinued the everolimus secondary to lack of insurance coverage.  Sunitinib is also cost prohibitive. ? ?He reports malaise.  No new complaint. ? ?Objective: ? ?Vital signs in last 24 hours: ? ?Blood pressure 135/79, pulse 86, temperature 97.8 ?F (36.6 ?C), resp. rate 19, height '5\' 10"'$  (1.778 m), weight 166 lb 9.6 oz (75.6 kg), SpO2 98 %. ?  ? ?HEENT: No thrush or ulcers ?Resp: Lungs clear bilaterally, decreased breath sounds at the left compared to the right chest ?Cardio: Regular rate and rhythm ?GI: There is fullness in the right upper abdomen without a discrete liver edge, no splenomegaly ?Vascular: No leg edema ? ?Portacath/PICC-without erythema ? ?Lab Results: ? ?Lab Results  ?Component Value Date  ? WBC 4.3 11/01/2021  ? HGB 12.8 (L) 11/01/2021  ? HCT 38.1 (L) 11/01/2021  ? MCV 98.4 11/01/2021  ? PLT 145 (L) 11/01/2021  ? NEUTROABS 3.0 11/01/2021  ? ? ?CMP  ?Lab Results  ?Component Value Date  ? NA 138 11/01/2021  ? K 4.1 11/01/2021  ? CL 98 11/01/2021  ? CO2 32 11/01/2021  ? GLUCOSE 103 (H) 11/01/2021  ? BUN 28 (H) 11/01/2021  ? CREATININE 1.36 (H) 11/01/2021  ? CALCIUM 10.1 11/01/2021  ? PROT 7.2 11/01/2021  ? ALBUMIN 4.2 11/01/2021  ? AST 29 11/01/2021  ? ALT 17 11/01/2021  ? ALKPHOS 73 11/01/2021  ? BILITOT 0.9 11/01/2021  ? GFRNONAA 57 (L) 11/01/2021  ? GFRAA >60 01/19/2020  ? ? ? ?Medications: I have reviewed the patient's current medications. ? ? ?Assessment/Plan: ?Pancreatic neuroendocrine tumor, WHO grade 2, pancreatic head mass and small peripancreatic/celiac nodes on a CT 02/04/2014 ?EUS revealed evidence of multiple liver metastases-status post an FNA biopsy of a left liver lesion 02/10/2014 confirming a neuroendocrine tumor   ?Octreotide scan 04/01/2014 with multiple foci of metastatic neuroendocrine  tumor in the liver ?Monthly Sandostatin started 03/16/2014 ?Restaging CT 07/04/2014 with resolution of a previously noted pancreas head lesion and probable progression of liver metastases ?Restaging CT 10/31/2014-stable liver lesions, no evidence of a pancreas mass, stable. ?Restaging CT 02/28/2015-stable hepatic metastases, stable pancreas lesion unchanged ?Restaging CT 08/30/2015-stable pancreas mass, slight enlargement of hepatic metastases ?Monthly Sandostatin continued ?Restaging CTs 01/17/2016 revealed enlargement of liver lesions, no new lesions, enlargement of the pancreas head mass ?Gallium DOTATATE scan 10/01/2016 confirmed uptake in the liver metastases, pancreas primary, peripancreatic adenopathy, and left iliac bone ?Cycle 1 Lutathera 03/05/2017 ?Cycle 2 Lutathera 04/30/2017 ?Cycle 3 Lutathera 07/02/2017 ?Cycle 4 Lutathera 08/27/2017 ?Netspot scan 09/29/2017-decreased radiotracer activity within the pancreas head, peripancreatic lymph node, and solitary skeletal lesion.  Liver lesions have increased in size with a decrease in radiotracer activity ?Monthly Sandostatin continued ?Netspot scan 05/20/2018-stable to decreased size of liver lesions, most have decreased SUV, similar uptake in the pancreas lesion, previously noted peripancreatic lymph node has resolved him a mild decrease in uptake associated with a left iliac metastasis, no evidence of disease progression ?Monthly Sandostatin continued ?Netspot scan 01/29/2019- overall stable to improved, 1 lesion left hepatic lobe has increased tracer activity-unchanged in size, majority of hepatic lesions have reduced activity compared to the pretreatment scan, decreased activity left iliac bone lesion, stable activity in the head of the pancreas lesion, no new lesions ?Monthly Sandostatin continued ?Netspot on September 24, 2019-increase in size of 3 liver lesions with  associated stable radiotracer activity, no new lesions, stable small left iliac lesion ?Cycle  1 salvage Lutathera 10/27/2019 ?Cycle 2 salvage Lutathera 12/22/2019 ?Netspot on 02/15/2020-decrease in radiotracer activity in the liver metastases with a decrease in size of several lesions, no new lesions, no progressive disease in the bowel or mesentery, decreased activity in the left iliac bone lesion ?Monthly Sandostatin continued  ?Netspot 10/13/2020-decrease in radiotracer activity in the left and right liver, decreased size of liver lesions, one lesion in the superior right liver has increased in size and has no radiotracer activity, no evidence of metastatic disease outside the liver ?PET 11/15/2020- moderate hypermetabolic activity associated with several enlarging liver lesions that had limited activity on the dotatate PET, a lesion in the left lateral hepatic lobe with mild persistent dotatate activity has mild peripheral FDG activity and is decreased in size from a dotatate PET 1 year ago.  No hypermetabolic activity in the pancreas or bowel.  No hypermetabolic activity in the previous dotatate avid left iliac lesion ?Monthly Sandostatin continued ?Cycle 1 capecitabine/temozolomide 01/22/2021 (capecitabine twice daily days 1 through 14 of each 28-day cycle/temozolomide days 10 through 14) ?Cycle 2 capecitabine/temozolomide 02/19/2021 ?Cycle 3 capecitabine/temozolomide 03/19/2021 ?Cycle 4 capecitabine/temozolomide 04/17/2021 ?PET 05/11/2021-slight increase in size of several FDG avid liver lesions, no new hepatic lesions, no evidence of metastatic disease to the chest or bones ?Cycle 5 capecitabine/temozolomide 05/16/2021 ?Cycle 6 capecitabine/temozolomide 06/25/2021, capecitabine dose reduced ?Cycle 7 capecitabine/temozolomide 07/23/2021 ?Cycle 8 capecitabine/temozolomide 08/20/2021 ?PET 09/13/2021-mildly progressive hepatic metastases ?Everolimus 10/11/2021-discontinued after 2 weeks secondary to cost ?   ?Chest Seminoma in 1990 treated with BEP and chest radiation at [facility] ?3. 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 chronic exertional dyspnea following treatment for the seminoma   ?5. aortic stenosis on an echocardiogram December 2011, severe aortic stenosis on echocardiogram 12/06/2015, status post TAVR on 04/16/2016 ?6. lumbar disc surgery 2014   ?7. acute abdominal pain 02/04/2014-resolved   ?8.  Diabetes ?9.  COVID-19 infection July 2022 ? ? ? ?Disposition: ?[name] appears stable.  He has metastatic pancreas neuroendocrine carcinoma.  He completed a short course of everolimus.  This was discontinued secondary to lack of insurance coverage.  Everolimus and sunitinib are cost prohibitive for him.  The plan is to begin a trial of FOLFOX chemotherapy.  He will be referred for Port-A-Cath placement. ? ?We reviewed potential toxicities associated with the FOLFOX regimen including the chance of nausea/vomiting, mucositis, diarrhea, alopecia, and hematologic toxicity.  We discussed the sun sensitivity, hyperpigmentation, and hand/foot syndrome associated with 5-fluorouracil.  We reviewed the various types of neuropathy associated with oxaliplatin.  He will attend a chemotherapy teaching class.  He agrees to proceed. ? ?[name] has a busy schedule during the month of April.  He has a vacation planned.  He would like to begin treatment when he returns from vacation. ? ?He will be scheduled for Port-A-Cath placement and FOLFOX during the week of 12/10/2021. ? ?A chemotherapy plan was entered today. ? ?Betsy Coder, MD ? ?11/01/2021  ?9:02 AM ? ? ?

## 2021-11-05 ENCOUNTER — Other Ambulatory Visit (HOSPITAL_COMMUNITY): Payer: Self-pay

## 2021-11-05 ENCOUNTER — Telehealth: Payer: Self-pay | Admitting: Pharmacy Technician

## 2021-11-06 ENCOUNTER — Other Ambulatory Visit (HOSPITAL_COMMUNITY): Payer: Self-pay

## 2021-11-06 ENCOUNTER — Ambulatory Visit (INDEPENDENT_AMBULATORY_CARE_PROVIDER_SITE_OTHER): Payer: Medicare Other | Admitting: Internal Medicine

## 2021-11-06 ENCOUNTER — Encounter: Payer: Self-pay | Admitting: Internal Medicine

## 2021-11-06 VITALS — BP 126/70 | HR 80 | Temp 97.8°F | Ht 70.0 in | Wt 167.4 lb

## 2021-11-06 DIAGNOSIS — R972 Elevated prostate specific antigen [PSA]: Secondary | ICD-10-CM | POA: Diagnosis not present

## 2021-11-06 DIAGNOSIS — I10 Essential (primary) hypertension: Secondary | ICD-10-CM | POA: Diagnosis not present

## 2021-11-06 DIAGNOSIS — E119 Type 2 diabetes mellitus without complications: Secondary | ICD-10-CM | POA: Diagnosis not present

## 2021-11-06 DIAGNOSIS — E559 Vitamin D deficiency, unspecified: Secondary | ICD-10-CM | POA: Diagnosis not present

## 2021-11-06 DIAGNOSIS — E7800 Pure hypercholesterolemia, unspecified: Secondary | ICD-10-CM | POA: Diagnosis not present

## 2021-11-06 DIAGNOSIS — N1831 Chronic kidney disease, stage 3a: Secondary | ICD-10-CM

## 2021-11-06 MED ORDER — SIMVASTATIN 20 MG PO TABS
ORAL_TABLET | ORAL | 3 refills | Status: DC
Start: 2021-11-06 — End: 2024-04-17

## 2021-11-06 MED ORDER — LOSARTAN POTASSIUM 100 MG PO TABS: 100.0000 mg | ORAL_TABLET | Freq: Every day | ORAL | 3 refills | Status: DC

## 2021-11-06 NOTE — Patient Instructions (Addendum)
Ok to change the losartan HCT to losartan alone for now given the wt loss and the upcoming chemo ? ?Please continue all other medications as before, and refills have been done if requested. ? ?Please have the pharmacy call with any other refills you may need. ? ?Please continue your efforts at being more active, low cholesterol diet, and weight control. ? ?You are otherwise up to date with prevention measures today. ? ?Please keep your appointments with your specialists as you may have planned ? ?Please make an Appointment to return in 6 months, or sooner if needed ? ? ?

## 2021-11-06 NOTE — Progress Notes (Signed)
Patient ID: Ronald Thompson, male   DOB: 05/22/1956, 66 y.o.   MRN: [id number] ? ? ? ?     Chief Complaint:: yearly exam ? ?     HPI:  Ronald Thompson is a 66 y.o. male overall doing ok except for gradually worsening fatigue and general weakness.  Due to start every other wk chemo after port placement may 10 for persistent worsening malignancy.  Pt denies fever, night sweats, loss of appetite, or other constitutional symptoms, though has lost wt 20 lbs overall.  No longer seeing endo, peak wt has been about 183, now in mid 160s.  Had recent labs and declines further today despite not having lipids or a1c done.  Denies urinary symptoms such as dysuria, urgency, flank pain, hematuria or n/v, fever, chills, but does have some urinary frequency.  Does not want med for prostate or bladder for now.  ?Wt Readings from Last 3 Encounters:  ?11/06/21 167 lb 6.4 oz (75.9 kg)  ?11/01/21 166 lb 9.6 oz (75.6 kg)  ?10/16/21 165 lb 12.8 oz (75.2 kg)  ? ?BP Readings from Last 3 Encounters:  ?11/06/21 126/70  ?11/01/21 135/79  ?10/16/21 137/80  ? ?Immunization History  ?Administered Date(s) Administered  ? Fluad Quad(high Dose 65+) 06/09/2019, 06/20/2020  ? Influenza Whole 06/09/2010, 04/26/2012  ? Influenza,inj,Quad PF,6+ Mos 04/13/2014, 04/26/2016, 05/14/2017  ? Influenza-Unspecified 05/30/2015, 04/05/2018, 04/05/2021  ? PFIZER(Purple Top)SARS-COV-2 Vaccination 10/20/2019, 11/10/2019, 07/19/2020  ? Pneumococcal Conjugate-13 09/22/2019  ? Pneumococcal Polysaccharide-23 10/19/2020  ? Td 06/09/2010  ? Tdap 11/18/2011  ? Zoster Recombinat (Shingrix) 03/05/2018, 05/07/2018, 07/18/2018  ? ?There are no preventive care reminders to display for this patient. ? ?  ? ?Past Medical History:  ?Diagnosis Date  ? Baker's cyst   ? Gallstones   ? Heart murmur   ? Hypercholesteremia   ? Hypertension   ? Lesion of left lung   ? hx.25 yrs ago- testicilar cancer related- only scarring left after tx.-no problems now  ? Liver metastasis   ? Occlusion of left  subclavian vein ([place]) 1990  ? and Brachiocephalic- DVT   during chemo left subclavian are larger than right.  ? Pericarditis   ? 2nd to tumor  ? Pneumonia 1990  ? Primary neuroendocrine tumor of pancreas 02/14/2014  ? Stage IV with multiple mets to liver  ? Pulmonary fibrosis ([place]) 11/08/2015  ? S/P TAVR (transcatheter aortic valve replacement) 04/16/2016  ? 26 mm Edwards Sapien 3 transcatheter heart valve placed via percutaneous right transfemoral approach   ? Shortness of breath dyspnea   ? with exertion and when tired  ? 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 with metatstatic spread - good response to therapy-radiation and chemotherapy  ? Transfusion history   ? hx. 25 yrs ago-during cancer tx.  ? ?Past Surgical History:  ?Procedure Laterality Date  ? BACK SURGERY    ? '14- rupt. disc   ? CARDIAC CATHETERIZATION N/A 01/24/2016  ? Procedure: Right/Left Heart Cath and Coronary Angiography;  Surgeon: [name], MD;  Location: [facility];  Service: Cardiovascular;  Laterality: N/A;  ? COLONOSCOPY    ? EUS N/A 02/10/2014  ? Procedure: UPPER ENDOSCOPIC ULTRASOUND (EUS) LINEAR;  Surgeon: [name], MD;  Location: [facility];  Service: Endoscopy;  Laterality: N/A;  ? IR RADIOLOGIST EVAL & MGMT  02/11/2017  ? KNEE SURGERY Right   ? age 23 for Baker's cyst  ? NASAL SEPTUM SURGERY    ? Deviated septrum  ? TEE WITHOUT CARDIOVERSION N/A  04/16/2016  ? Procedure: TRANSESOPHAGEAL ECHOCARDIOGRAM (TEE);  Surgeon: [name], MD;  Location: [facility];  Service: Open Heart Surgery;  Laterality: N/A;  ? THORACOTOMY  1990  ? wedge biopsy of mediastinal mass  ? TRANSCATHETER AORTIC VALVE REPLACEMENT, TRANSFEMORAL N/A 04/16/2016  ? Procedure: TRANSCATHETER AORTIC VALVE REPLACEMENT, TRANSFEMORAL;  Surgeon: [name], MD;  Location: [facility];  Service: Open Heart Surgery;  Laterality: N/A;  ? ? reports that he has never smoked. He has never used smokeless tobacco. He reports current alcohol use. He reports that he does not use  drugs. ?family history includes Cancer in his maternal aunt and paternal grandfather; Dementia in his mother; Emphysema in his maternal aunt. ?Allergies  ?Allergen Reactions  ? Penicillins Hives  ?  ENTIRE BODY ?Has patient had a PCN reaction causing immediate rash, facial/tongue/throat swelling, SOB or lightheadedness with hypotension: No ?Has patient had a PCN reaction causing severe rash involving mucus membranes or skin necrosis: No ?Has patient had a PCN reaction that required hospitalization * *  YES  * * ?Has patient had a PCN reaction occurring within the last 10 years: No ?If all of the above answers are "NO", then may proceed with Cephalosporin use. ?*reaction occurred when he was 17  ? ?Current Outpatient Medications on File Prior to Visit  ?Medication Sig Dispense Refill  ? acetaminophen (TYLENOL) 500 MG tablet Take 1,000 mg by mouth every 6 (six) hours as needed for headache.    ? albuterol (VENTOLIN HFA) 108 (90 Base) MCG/ACT inhaler Inhale 1-2 puffs into the lungs every 6 (six) hours as needed for wheezing or shortness of breath. 1 each 4  ? calcium carbonate (TUMS - DOSED IN MG ELEMENTAL CALCIUM) 500 MG chewable tablet Chew 3 tablets by mouth 3 (three) times daily as needed for indigestion or heartburn.    ? clindamycin (CLEOCIN) 300 MG capsule Take 2 capsules by mouth one hour prior to dental appointment 6 capsule 2  ? docusate sodium (COLACE) 100 MG capsule Take 100 mg by mouth daily as needed for mild constipation.    ? everolimus (AFINITOR) 7.5 MG tablet Take 1 tablet (7.5 mg total) by mouth daily. 30 tablet 0  ? gabapentin (NEURONTIN) 300 MG capsule TAKE ONE CAPSULE BY MOUTH THREE TIMES A DAY   START AFRER 100 MG INTIAL RX 90 capsule 1  ? glucose blood (ONETOUCH VERIO) test strip 1 each by Other route daily in the afternoon. Use as instructed to test blood sugar one time daily E11.65 100 each 3  ? ibuprofen (ADVIL,MOTRIN) 200 MG tablet Take 400 mg by mouth every 6 (six) hours as needed for  headache or mild pain.     ? Lancet Device MISC Use as instructed to test blood sugar daily E11.65 1 each 2  ? Lancets (ONETOUCH DELICA PLUS TWSFKC12X) MISC TEST ONCE DAILY 100 each 2  ? Octreotide Acetate (SANDOSTATIN IJ) Inject 1 each as directed every 30 (thirty) days.     ? ondansetron (ZOFRAN) 8 MG tablet Take 1 tablet (8 mg total) by mouth as directed. Take one tablet 30 minutes prior to each Temodar dose and every 8 hours as needed for nausea 30 tablet 1  ? sildenafil (VIAGRA) 100 MG tablet TAKE ONE HALF TO ONE TABLET BY MOUTH DAILY AS NEEDED FOR FOR ERECTILE DYSFUNCTION 5 tablet 10  ? ?Current Facility-Administered Medications on File Prior to Visit  ?Medication Dose Route Frequency Provider Last Rate Last Admin  ? heparin lock flush 100 unit/mL  500 Units Intravenous Once Ladell Pier, MD      ? octreotide (SANDOSTATIN LAR) 30 MG IM injection           ? sodium chloride flush (NS) 0.9 % injection 10 mL  10 mL Intracatheter PRN Ladell Pier, MD      ? ?     ROS:  All others reviewed and negative. ? ?Objective  ? ?     PE:  BP 126/70 (BP Location: Right Arm, Patient Position: Sitting, Cuff Size: Normal)   Pulse 80   Temp 97.8 ?F (36.6 ?C) (Oral)   Ht '5\' 10"'$  (1.778 m)   Wt 167 lb 6.4 oz (75.9 kg)   SpO2 97%   BMI 24.02 kg/m?  ? ?              Constitutional: Pt appears in NAD ?              HENT: Head: NCAT.  ?              Right Ear: External ear normal.   ?              Left Ear: External ear normal.  ?              Eyes: . Pupils are equal, round, and reactive to light. Conjunctivae and EOM are normal ?              Nose: without d/c or deformity ?              Neck: Neck supple. Gross normal ROM ?              Cardiovascular: Normal rate and regular rhythm.   ?              Pulmonary/Chest: Effort normal and breath sounds without rales or wheezing.  ?              Abd:  Soft, NT, ND, + BS, no organomegaly ?              Neurological: Pt is alert. At baseline orientation, motor grossly  intact ?              Skin: Skin is warm. No rashes, no other new lesions, LE edema - none ?              Psychiatric: Pt behavior is normal without agitation  ? ?Micro: none ? ?Cardiac tracings I have personally interpreted t

## 2021-11-08 DIAGNOSIS — Z20822 Contact with and (suspected) exposure to covid-19: Secondary | ICD-10-CM | POA: Diagnosis not present

## 2021-11-11 ENCOUNTER — Encounter: Payer: Self-pay | Admitting: Internal Medicine

## 2021-11-11 NOTE — Assessment & Plan Note (Signed)
Last vitamin D ?Lab Results  ?Component Value Date  ? VD25OH 89.44 10/19/2020  ? ?Stable, cont oral replacement ? ?

## 2021-11-11 NOTE — Assessment & Plan Note (Signed)
Lab Results  ?Component Value Date  ? CREATININE 1.36 (H) 11/01/2021  ? ?Stable overall, cont to avoid nephrotoxins ? ?

## 2021-11-11 NOTE — Assessment & Plan Note (Signed)
Given coming chemo tx will proactively change losartan hct to losartan 100 qd,  to f/u any worsening symptoms or concerns ?

## 2021-11-11 NOTE — Assessment & Plan Note (Signed)
Lab Results  ?Component Value Date  ? HGBA1C 5.7 (A) 08/08/2021  ? ?Stable, pt to continue current medical treatment  - diet ? ?

## 2021-11-11 NOTE — Assessment & Plan Note (Signed)
Lab Results  ?Component Value Date  ? PSA 1.88 10/19/2020  ? PSA 1.80 09/22/2019  ? PSA 1.35 06/18/2018  ? ?overall stable,  to f/u any worsening symptoms or concerns ?

## 2021-11-11 NOTE — Assessment & Plan Note (Signed)
Lab Results  ?Component Value Date  ? [place] 73 10/19/2020  ? ?Mild uncontrolled, goal ldl < 70, pt to continue current statin zocor 20 as declines change for now ? ?

## 2021-11-14 ENCOUNTER — Telehealth: Payer: Self-pay | Admitting: Pharmacy Technician

## 2021-11-19 DIAGNOSIS — Z20822 Contact with and (suspected) exposure to covid-19: Secondary | ICD-10-CM | POA: Diagnosis not present

## 2021-11-20 ENCOUNTER — Encounter: Payer: Self-pay | Admitting: *Deleted

## 2021-11-20 ENCOUNTER — Inpatient Hospital Stay: Payer: Medicare Other | Attending: Oncology | Admitting: Nurse Practitioner

## 2021-11-20 ENCOUNTER — Inpatient Hospital Stay: Payer: Medicare Other

## 2021-11-20 ENCOUNTER — Encounter: Payer: Self-pay | Admitting: Nurse Practitioner

## 2021-11-20 VITALS — BP 125/68 | HR 89 | Temp 98.2°F | Resp 20 | Ht 70.0 in | Wt 166.4 lb

## 2021-11-20 DIAGNOSIS — D3A8 Other benign neuroendocrine tumors: Secondary | ICD-10-CM

## 2021-11-20 DIAGNOSIS — C7B8 Other secondary neuroendocrine tumors: Secondary | ICD-10-CM | POA: Diagnosis not present

## 2021-11-20 DIAGNOSIS — C7A8 Other malignant neuroendocrine tumors: Secondary | ICD-10-CM | POA: Diagnosis not present

## 2021-11-20 MED ORDER — OCTREOTIDE ACETATE 30 MG IM KIT
30.0000 mg | PACK | Freq: Once | INTRAMUSCULAR | Status: AC
  Administered 2021-11-20: 30 mg via INTRAMUSCULAR

## 2021-11-20 NOTE — Progress Notes (Signed)
PATIENT NAVIGATOR PROGRESS NOTE ? ?Name: Ronald Thompson ?Date: 11/20/2021 ?MRN: [id number]  ?DOB: [birth year] ? ? ?Reason for visit:  ?Patient education session ? ?Comments:  met with Mr Scheier and provided patient education on FOLFOX therapy which will begin in May ? ? ? ?Time spent counseling/coordinating care: > 60 minutes ? ?

## 2021-11-20 NOTE — Progress Notes (Signed)
?[facility] ?OFFICE PROGRESS NOTE ? ? ?Diagnosis: Pancreas neuroendocrine tumor ? ?INTERVAL HISTORY:  ? ?Ronald Thompson returns as scheduled.  He reports feeling weaker over the past few weeks.  He notes progressive dyspnea on exertion, associated chest heaviness.  No diarrhea.  No nausea or vomiting.  He reports his weight is stable. ? ?Objective: ? ?Vital signs in last 24 hours: ? ?Blood pressure 125/68, pulse 89, temperature 98.2 ?F (36.8 ?C), temperature source Oral, resp. rate 20, height '5\' 10"'$  (1.778 m), weight 166 lb 6.4 oz (75.5 kg), SpO2 98 %. ?  ? ?Resp: Lungs are clear.  Breath sounds diminished at the left compared to the right chest. ?Cardio: Regular rate and rhythm.  Neck veins are distended. ?GI: Palpable fullness/firmness throughout the right upper abdomen and extending across midline. ?Vascular: No leg edema. ? ?Lab Results: ? ?Lab Results  ?Component Value Date  ? WBC 4.3 11/01/2021  ? HGB 12.8 (L) 11/01/2021  ? HCT 38.1 (L) 11/01/2021  ? MCV 98.4 11/01/2021  ? PLT 145 (L) 11/01/2021  ? NEUTROABS 3.0 11/01/2021  ? ? ?Imaging: ? ?No results found. ? ?Medications: I have reviewed the patient's current medications. ? ?Assessment/Plan: ?Pancreatic neuroendocrine tumor, WHO grade 2, pancreatic head mass and small peripancreatic/celiac nodes on a CT 02/04/2014 ?EUS revealed evidence of multiple liver metastases-status post an FNA biopsy of a left liver lesion 02/10/2014 confirming a neuroendocrine tumor   ?Octreotide scan 04/01/2014 with multiple foci of metastatic neuroendocrine tumor in the liver ?Monthly Sandostatin started 03/16/2014 ?Restaging CT 07/04/2014 with resolution of a previously noted pancreas head lesion and probable progression of liver metastases ?Restaging CT 10/31/2014-stable liver lesions, no evidence of a pancreas mass, stable. ?Restaging CT 02/28/2015-stable hepatic metastases, stable pancreas lesion unchanged ?Restaging CT 08/30/2015-stable pancreas mass, slight  enlargement of hepatic metastases ?Monthly Sandostatin continued ?Restaging CTs 01/17/2016 revealed enlargement of liver lesions, no new lesions, enlargement of the pancreas head mass ?Gallium DOTATATE scan 10/01/2016 confirmed uptake in the liver metastases, pancreas primary, peripancreatic adenopathy, and left iliac bone ?Cycle 1 Lutathera 03/05/2017 ?Cycle 2 Lutathera 04/30/2017 ?Cycle 3 Lutathera 07/02/2017 ?Cycle 4 Lutathera 08/27/2017 ?Netspot scan 09/29/2017-decreased radiotracer activity within the pancreas head, peripancreatic lymph node, and solitary skeletal lesion.  Liver lesions have increased in size with a decrease in radiotracer activity ?Monthly Sandostatin continued ?Netspot scan 05/20/2018-stable to decreased size of liver lesions, most have decreased SUV, similar uptake in the pancreas lesion, previously noted peripancreatic lymph node has resolved him a mild decrease in uptake associated with a left iliac metastasis, no evidence of disease progression ?Monthly Sandostatin continued ?Netspot scan 01/29/2019- overall stable to improved, 1 lesion left hepatic lobe has increased tracer activity-unchanged in size, majority of hepatic lesions have reduced activity compared to the pretreatment scan, decreased activity left iliac bone lesion, stable activity in the head of the pancreas lesion, no new lesions ?Monthly Sandostatin continued ?Netspot on September 24, 2019-increase in size of 3 liver lesions with associated stable radiotracer activity, no new lesions, stable small left iliac lesion ?Cycle 1 salvage Lutathera 10/27/2019 ?Cycle 2 salvage Lutathera 12/22/2019 ?Netspot on 02/15/2020-decrease in radiotracer activity in the liver metastases with a decrease in size of several lesions, no new lesions, no progressive disease in the bowel or mesentery, decreased activity in the left iliac bone lesion ?Monthly Sandostatin continued  ?Netspot 10/13/2020-decrease in radiotracer activity in the left and right  liver, decreased size of liver lesions, one lesion in the superior right liver has increased in size and  has no radiotracer activity, no evidence of metastatic disease outside the liver ?PET 11/15/2020- moderate hypermetabolic activity associated with several enlarging liver lesions that had limited activity on the dotatate PET, a lesion in the left lateral hepatic lobe with mild persistent dotatate activity has mild peripheral FDG activity and is decreased in size from a dotatate PET 1 year ago.  No hypermetabolic activity in the pancreas or bowel.  No hypermetabolic activity in the previous dotatate avid left iliac lesion ?Monthly Sandostatin continued ?Cycle 1 capecitabine/temozolomide 01/22/2021 (capecitabine twice daily days 1 through 14 of each 28-day cycle/temozolomide days 10 through 14) ?Cycle 2 capecitabine/temozolomide 02/19/2021 ?Cycle 3 capecitabine/temozolomide 03/19/2021 ?Cycle 4 capecitabine/temozolomide 04/17/2021 ?PET 05/11/2021-slight increase in size of several FDG avid liver lesions, no new hepatic lesions, no evidence of metastatic disease to the chest or bones ?Cycle 5 capecitabine/temozolomide 05/16/2021 ?Cycle 6 capecitabine/temozolomide 06/25/2021, capecitabine dose reduced ?Cycle 7 capecitabine/temozolomide 07/23/2021 ?Cycle 8 capecitabine/temozolomide 08/20/2021 ?PET 09/13/2021-mildly progressive hepatic metastases ?Everolimus 10/11/2021-discontinued after 2 weeks secondary to cost ?   ?Chest Seminoma in 1990 treated with BEP and chest radiation at [facility] ?3. 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 chronic exertional dyspnea following treatment for the seminoma   ?5. aortic stenosis on an echocardiogram December 2011, severe aortic stenosis on echocardiogram 12/06/2015, status post TAVR on 04/16/2016 ?6. lumbar disc surgery 2014   ?7. acute  abdominal pain 02/04/2014-resolved   ?8.  Diabetes ?9.  COVID-19 infection July 2022 ?  ? ?Disposition: Ronald Thompson appears stable.  He is scheduled for Port-A-Cath placement 12/10/2021 and the first cycle of FOLFOX on 12/12/2021.  Chemotherapy education class today. ? ?At today's visit he reports an approximate 2-week history of progressive dyspnea on exertion.  We recommended he follow-up with Dr. Acie Fredrickson.  He will let us know if he is unable to secure an appointment in the near future. ? ?He will return for follow-up as scheduled on 12/12/2021.  We are available to see him sooner if needed. ? ?Patient seen with Dr. [name]. ? ? ? ? ?Ned Card ANP/GNP-BC  ? ?11/20/2021  ?9:27 AM ? ?This was a shared visit with Ned Card.  Ronald Thompson was interviewed and examined.  He has not been cleared to obtain cabozantinib.  The plan is to proceed with FOLFOX chemotherapy when he returns from vacation. ?He has increased dyspnea, potentially related to progression of the neuroendocrine tumor versus heart disease.  He will schedule an appointment with Dr. Acie Fredrickson. ? ?I was present for greater than 50% of today's visit.  I performed medical decision making. ? ?[name], MD ? ? ? ? ? ?

## 2021-11-20 NOTE — Patient Instructions (Signed)
Octreotide injection solution ?What is this medication? ?OCTREOTIDE (ok TREE oh tide) is used to reduce blood levels of growth hormone in patients with a condition called acromegaly. This medicine also reduces flushing and watery diarrhea caused by certain types of cancer. ?This medicine may be used for other purposes; ask your health care provider or pharmacist if you have questions. ?COMMON BRAND NAME(S): Bynfezia, Sandostatin ?What should I tell my care team before I take this medication? ?They need to know if you have any of these conditions: ?diabetes ?gallbladder disease ?kidney disease ?liver disease ?thyroid disease ?an unusual or allergic reaction to octreotide, other medicines, foods, dyes, or preservatives ?pregnant or trying to get pregnant ?breast-feeding ?How should I use this medication? ?This medication is injected under the skin or into a vein. It is usually given by your care team in a hospital or clinic setting. ?If you get this medication at home, you will be taught how to prepare and give it. Use exactly as directed. Take it as directed on the prescription label at the same time every day. Keep taking it unless your care team tells you to stop. ?Allow the injection solution to come to room temperature before use. Do not warm it artificially. ?It is important that you put your used needles and syringes in a special sharps container. Do not put them in a trash can. If you do not have a sharps container, call your pharmacist or care team to get one. ?Talk to your care team about the use of this medication in children. Special care may be needed. ?Overdosage: If you think you have taken too much of this medicine contact a poison control center or emergency room at once. ?NOTE: This medicine is only for you. Do not share this medicine with others. ?What if I miss a dose? ?If you miss a dose, take it as soon as you can. If it is almost time for your next dose, take only that dose. Do not take double  or extra doses. ?What may interact with this medication? ?bromocriptine ?certain medicines for blood pressure, heart disease, irregular heartbeat ?cyclosporine ?diuretics ?medicines for diabetes, including insulin ?quinidine ?This list may not describe all possible interactions. Give your health care provider a list of all the medicines, herbs, non-prescription drugs, or dietary supplements you use. Also tell them if you smoke, drink alcohol, or use illegal drugs. Some items may interact with your medicine. ?What should I watch for while using this medication? ?Visit your care team for regular checks on your progress. Tell your care team if your symptoms do not start to get better or if they get worse. ?To help reduce irritation at the injection site, use a different site for each injection and make sure the solution is at room temperature before use. ?This medication may cause decreases in blood sugar. Signs of low blood sugar include chills, cool, pale skin or cold sweats, drowsiness, extreme hunger, fast heartbeat, headache, nausea, nervousness or anxiety, shakiness, trembling, unsteadiness, tiredness, or weakness. Contact your care team right away if you experience any of these symptoms. ?This medication may increase blood sugar. The risk may be higher in patients who already have diabetes. Ask your care team what you can do to lower your risk of diabetes while taking this medication. ?You should make sure you get enough vitamin B12 while you are taking this medication. Discuss the foods you eat and the vitamins you take with your care team. ?What side effects may I notice from receiving   this medication? ?Side effects that you should report to your doctor or health care professional as soon as possible: ?allergic reactions like skin rash, itching or hives, swelling of the face, lips, or tongue ?fast, slow, or irregular heartbeat ?right upper belly pain ?severe stomach pain ?signs and symptoms of high blood sugar  such as being more thirsty or hungry or having to urinate more than normal. You may also feel very tired or have blurry vision. ?signs and symptoms of low blood sugar such as feeling anxious; confusion; dizziness; increased hunger; unusually weak or tired; increased sweating; shakiness; cold, clammy skin; irritable; headache; blurred vision; fast heartbeat; loss of consciousness ?unusually weak or tired ?Side effects that usually do not require medical attention (report to your doctor or health care professional if they continue or are bothersome): ?diarrhea ?dizziness ?gas ?headache ?nausea, vomiting ?pain, redness, or irritation at site where injected ?upset stomach ?This list may not describe all possible side effects. Call your doctor for medical advice about side effects. You may report side effects to FDA at 1-800-FDA-1088. ?Where should I keep my medication? ?Keep out of the reach of children and pets. ?Store in the refrigerator. Protect from light. Allow to come to room temperature naturally. Do not use artificial heat. If protected from light, the injection may be stored between 20 and 30 degrees C (70 and 86 degrees F) for 14 days. After the initial use, throw away any unused portion of a multiple dose vial after 14 days. Get rid of any unused portions of the ampules after use. ?To get rid of medications that are no longer needed or have expired: ?Take the medication to a medication take-back program. Ask your pharmacy or law enforcement to find a location. ?If you cannot return the medication, ask your pharmacist or care team how to get rid of the medication safely. ?NOTE: This sheet is a summary. It may not cover all possible information. If you have questions about this medicine, talk to your doctor, pharmacist, or health care provider. ?? 2023 Elsevier/Gold Standard (2021-07-13 00:00:00) ? ?

## 2021-11-23 ENCOUNTER — Other Ambulatory Visit: Payer: Self-pay | Admitting: *Deleted

## 2021-11-23 NOTE — Telephone Encounter (Addendum)
Oral Oncology Patient Advocate Encounter ? ?Received notification from [facility] that the request for prior authorization for Cabometyx has been denied due to: medication authorization requires a diagnosis of locally advanced or metastatic differentiated thyroid cancer (DTC) (follicular, paillary, or Hurthle cell).   ?  ?This determination is being appealed.  The appeal packet was faxed to [phone] on 11/09/21.  ? ?This encounter will continue to be updated until final determination.   ? ?Dennison Nancy CPHT ?Specialty Pharmacy Patient Advocate ?[place] ?Phone [phone] ?Fax [phone] ? ? ?

## 2021-11-23 NOTE — Telephone Encounter (Signed)
Oral Oncology Patient Advocate Encounter ? ?Coordinated with patient to stop by the [facility] center to complete application for [employer] in an effort to reduce patient's out of pocket expense for Cabometyx to $0.   ? ?Application completed and faxed to [phone] on 11/14/21.  ? ?EASE patient assistance phone number for follow up is [phone]. ? ?This encounter will be updated until final determination.  ? ?Dennison Nancy CPHT ?Specialty Pharmacy Patient Advocate ?[place] ?Phone [phone] ?Fax [phone] ?11/23/2021 2:46 PM ? ? ?

## 2021-11-23 NOTE — Telephone Encounter (Signed)
Received fax on 11/12/21 from [facility] [employer]) that appeal to overturn the PA denial for Cabometyx was denied.  The information submitted did not meet the requirements of a medically-accepted indication. ? ?Patient is aware of denial and we have started the process to apply for patient assistance through the manufacturer. ? ?Dennison Nancy CPHT ?Specialty Pharmacy Patient Advocate ?[place] ?Phone [phone] ?Fax [phone] ? ?

## 2021-11-23 NOTE — Telephone Encounter (Signed)
Oral Oncology Patient Advocate Encounter ? ?Received notification from [place] that patient has been successfully enrolled into their program to receive Cabometyx from the manufacturer at $0 out of pocket until 08/04/22.  ?  ?I called and spoke with patient.  He knows we will have to re-apply.  ? ?Specialty Pharmacy that will dispense medication is [facility] [phone]). ? ?Patient knows to call the office with questions or concerns. ?  ?Oral Oncology Clinic will continue to follow. ? ?Dennison Nancy CPHT ?Specialty Pharmacy Patient Advocate ?[place] ?Phone [phone] ?Fax [phone] ?11/23/2021 2:47 PM ? ?

## 2021-11-23 NOTE — Telephone Encounter (Signed)
Oral Oncology Patient Advocate Encounter ?  ?Received notification from [facility] that prior authorization for Cabometyx is required. ?  ?PA submitted on CoverMyMeds on 11/05/21 ?Key [id number] ?Status is pending ?  ?Oral Oncology Clinic will continue to follow. ? ?Dennison Nancy CPHT ?Specialty Pharmacy Patient Advocate ?[place] ?Phone [phone] ?Fax [phone] ? ? ?

## 2021-11-26 NOTE — Progress Notes (Signed)
? ?Cardiology Office Note   ? ?Date:  11/27/2021  ? ?ID:  Ronald Thompson, DOB July 06, 1956, MRN [id number] ? ? ?PCP:  [name], MD ?  ?[place]  ?Cardiologist:  Mertie Moores, MD   ?Advanced Practice Provider:  No care team member to display ?Electrophysiologist:  None  ? ?[id number]}  ? ?Chief Complaint  ?Patient presents with  ? Shortness of Breath  ? ? ?History of Present Illness:  ?Ronald Thompson is a 66 y.o. male with history of aortic stenosis status post TAVR 2018, hypertension, HLD, pulmonary fibrosis, neuroendocrine tumor pancreatic cancer with mets to the liver ? ?Patient was referred back to [place] by hematology because of 2-week history of progressive dyspnea on exertion.  He is getting ready to start a new round of chemo with FOLFOX 12/12/2021.  ? ?Patient comes in with his wife. Over 6 months he's had progressively worsening DOE and fatigue. Can't live weights anymore. But going up stairs completely out of breath. No swelling. Gets dizzy and out of breath when he bends over. Weight has been stable for 8 months. No chest pain. Does a lot of work around the house without dyspnea. Fatigue is the worst. It could be coming from metastatic cancer that has advanced. Trying to care for his wife with alzheimer's.  ? ?Past Medical History:  ?Diagnosis Date  ? Baker's cyst   ? Gallstones   ? Heart murmur   ? Hypercholesteremia   ? Hypertension   ? Lesion of left lung   ? hx.25 yrs ago- testicilar cancer related- only scarring left after tx.-no problems now  ? Liver metastasis   ? 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 and Brachiocephalic- DVT   during chemo left subclavian are larger than right.  ? Pericarditis   ? 2nd to tumor  ? Pneumonia 1990  ? Primary neuroendocrine tumor of pancreas 02/14/2014  ? Stage IV with multiple mets to liver  ? Pulmonary fibrosis ([place]) 11/08/2015  ? S/P TAVR (transcatheter aortic valve replacement) 04/16/2016  ? 26 mm Edwards Sapien 3 transcatheter heart valve  placed via percutaneous right transfemoral approach   ? Shortness of breath dyspnea   ? with exertion and when tired  ? 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 with metatstatic spread - good response to therapy-radiation and chemotherapy  ? Transfusion history   ? hx. 25 yrs ago-during cancer tx.  ? ? ?Past Surgical History:  ?Procedure Laterality Date  ? BACK SURGERY    ? '14- rupt. disc   ? CARDIAC CATHETERIZATION N/A 01/24/2016  ? Procedure: Right/Left Heart Cath and Coronary Angiography;  Surgeon: [name], MD;  Location: [facility];  Service: Cardiovascular;  Laterality: N/A;  ? COLONOSCOPY    ? EUS N/A 02/10/2014  ? Procedure: UPPER ENDOSCOPIC ULTRASOUND (EUS) LINEAR;  Surgeon: [name], MD;  Location: [facility];  Service: Endoscopy;  Laterality: N/A;  ? IR RADIOLOGIST EVAL & MGMT  02/11/2017  ? KNEE SURGERY Right   ? age 11 for Baker's cyst  ? NASAL SEPTUM SURGERY    ? Deviated septrum  ? TEE WITHOUT CARDIOVERSION N/A 04/16/2016  ? Procedure: TRANSESOPHAGEAL ECHOCARDIOGRAM (TEE);  Surgeon: [name], MD;  Location: [facility];  Service: Open Heart Surgery;  Laterality: N/A;  ? THORACOTOMY  1990  ? wedge biopsy of mediastinal mass  ? TRANSCATHETER AORTIC VALVE REPLACEMENT, TRANSFEMORAL N/A 04/16/2016  ? Procedure: TRANSCATHETER AORTIC VALVE REPLACEMENT, TRANSFEMORAL;  Surgeon: [name], MD;  Location: [facility];  Service: Open Heart Surgery;  Laterality: N/A;  ? ? ?Current Medications: ?Current Meds  ?Medication Sig  ? acetaminophen (TYLENOL) 500 MG tablet Take 1,000 mg by mouth every 6 (six) hours as needed for headache.  ? albuterol (VENTOLIN HFA) 108 (90 Base) MCG/ACT inhaler Inhale 1-2 puffs into the lungs every 6 (six) hours as needed for wheezing or shortness of breath.  ? calcium carbonate (TUMS - DOSED IN MG ELEMENTAL CALCIUM) 500 MG chewable tablet Chew 3 tablets by mouth 3 (three) times daily as needed for indigestion or heartburn.  ? clindamycin (CLEOCIN) 300 MG capsule Take 2 capsules  by mouth one hour prior to dental appointment  ? docusate sodium (COLACE) 100 MG capsule Take 100 mg by mouth daily as needed for mild constipation.  ? gabapentin (NEURONTIN) 300 MG capsule TAKE ONE CAPSULE BY MOUTH THREE TIMES A DAY   START AFRER 100 MG INTIAL RX  ? glucose blood (ONETOUCH VERIO) test strip 1 each by Other route daily in the afternoon. Use as instructed to test blood sugar one time daily E11.65  ? ibuprofen (ADVIL,MOTRIN) 200 MG tablet Take 400 mg by mouth every 6 (six) hours as needed for headache or mild pain.   ? Lancet Device MISC Use as instructed to test blood sugar daily E11.65  ? Lancets (ONETOUCH DELICA PLUS [id number]) MISC TEST ONCE DAILY  ? losartan (COZAAR) 100 MG tablet Take 1 tablet (100 mg total) by mouth daily.  ? Octreotide Acetate (SANDOSTATIN IJ) Inject 1 each as directed every 30 (thirty) days.   ? ondansetron (ZOFRAN) 8 MG tablet Take 1 tablet (8 mg total) by mouth as directed. Take one tablet 30 minutes prior to each Temodar dose and every 8 hours as needed for nausea  ? sildenafil (VIAGRA) 100 MG tablet TAKE ONE HALF TO ONE TABLET BY MOUTH DAILY AS NEEDED FOR FOR ERECTILE DYSFUNCTION  ? simvastatin (ZOCOR) 20 MG tablet TAKE ONE TABLET BY MOUTH DAILY AT 8PM  ?  ? ?Allergies:   Penicillins  ? ?Social History  ? ?Socioeconomic History  ? Marital status: Married  ?  Spouse name: Not on file  ? Number of children: 3  ? Years of education: 35  ? Highest education level: Not on file  ?Occupational History  ? Occupation: Customer service manager  ?  Employer: [employer]  ?Tobacco Use  ? Smoking status: Never  ? Smokeless tobacco: Never  ?Vaping Use  ? Vaping Use: Never used  ?Substance and Sexual Activity  ? Alcohol use: Yes  ?  Comment: rare- social  ? Drug use: No  ? Sexual activity: Yes  ?  Partners: Female  ?Other Topics Concern  ? Not on file  ?Social History Narrative  ? 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 2 sons: 1979, 1985- married with 2 daughters 1 in route: Oldest in [place], younger  [place], [place]. 1 daughter: 45 Ronald Thompson, married-1 granddaughter.Customer service manager- Nurse, mental health. SO- good health; marriage in good health  ? Enjoys watching movies, works out at home gym  ? ?Social Determinants of Health  ? ?Financial Resource Strain: Low Risk   ? Difficulty of Paying Living Expenses: Not hard at all  ?Food Insecurity: No Food Insecurity  ? Worried About Charity fundraiser in the Last Year: Never true  ? Ran Out of Food in the Last Year: Never true  ?Transportation Needs: No Transportation Needs  ? Lack of Transportation (Medical): No  ? Lack of Transportation (Non-Medical): No  ?Physical  Activity: Sufficiently Active  ? Days of Exercise per Week: 7 days  ? Minutes of Exercise per Session: 30 min  ?Stress: No Stress Concern Present  ? Feeling of Stress : Not at all  ?Social Connections: Socially Integrated  ? Frequency of Communication with Friends and Family: More than three times a week  ? Frequency of Social Gatherings with Friends and Family: More than three times a week  ? Attends Religious Services: 1 to 4 times per year  ? Active Member of Clubs or Organizations: Yes  ? Attends Archivist Meetings: 1 to 4 times per year  ? Marital Status: Married  ?  ? ?Family History:  The patient's  family history includes Cancer in his maternal aunt and paternal grandfather; Dementia in his mother; Emphysema in his maternal aunt.  ? ?ROS:   ?Please see the history of present illness.    ?ROS All other systems reviewed and are negative. ? ? ?PHYSICAL EXAM:   ?VS:  BP 140/85   Pulse 80   Ht '5\' 10"'$  (1.778 m)   Wt 167 lb (75.8 kg)   SpO2 97%   BMI 23.96 kg/m?   ?Physical Exam  ?GEN: Thin, in no acute distress  ?Neck: no JVD, carotid bruits, or masses ?Cardiac:RRR; 2/6 systolic murmur and diastolic murmur.  ?Respiratory:  decreased breath sounds left lung base otherwise clear to auscultation bilaterally, normal work of breathing ?GI: soft, nontender, nondistended, + BS ?Ext: without cyanosis, clubbing,  or edema, Good distal pulses bilaterally ?Neuro:  Alert and Oriented x 3,  ?Psych: euthymic mood, full affect ? ?Wt Readings from Last 3 Encounters:  ?11/27/21 167 lb (75.8 kg)  ?11/20/21 166 lb 6.4 oz (75.5 kg)

## 2021-11-27 ENCOUNTER — Ambulatory Visit (INDEPENDENT_AMBULATORY_CARE_PROVIDER_SITE_OTHER): Payer: Medicare Other | Admitting: Physician Assistant

## 2021-11-27 ENCOUNTER — Encounter: Payer: Self-pay | Admitting: Physician Assistant

## 2021-11-27 VITALS — BP 140/85 | HR 80 | Ht 70.0 in | Wt 167.0 lb

## 2021-11-27 DIAGNOSIS — I350 Nonrheumatic aortic (valve) stenosis: Secondary | ICD-10-CM | POA: Diagnosis not present

## 2021-11-27 DIAGNOSIS — I10 Essential (primary) hypertension: Secondary | ICD-10-CM | POA: Diagnosis not present

## 2021-11-27 DIAGNOSIS — Z952 Presence of prosthetic heart valve: Secondary | ICD-10-CM

## 2021-11-27 DIAGNOSIS — E785 Hyperlipidemia, unspecified: Secondary | ICD-10-CM

## 2021-11-27 DIAGNOSIS — J8410 Pulmonary fibrosis, unspecified: Secondary | ICD-10-CM

## 2021-11-27 DIAGNOSIS — D3A8 Other benign neuroendocrine tumors: Secondary | ICD-10-CM

## 2021-11-27 NOTE — Patient Instructions (Signed)
Medication Instructions:  ?Your physician recommends that you continue on your current medications as directed. Please refer to the Current Medication list given to you today. ? ?*If you need a refill on your cardiac medications before your next appointment, please call your pharmacy* ? ? ?Lab Work: ?None ordered  ? ?If you have labs (blood work) drawn today and your tests are completely normal, you will receive your results only by: ?MyChart Message (if you have MyChart) OR ?A paper copy in the mail ?If you have any lab test that is abnormal or we need to change your treatment, we will call you to review the results. ? ? ?Testing/Procedures: ?Your physician has requested that you have an echocardiogram. Echocardiography is a painless test that uses sound waves to create images of your heart. It provides your doctor with information about the size and shape of your heart and how well your heart?s chambers and valves are working. This procedure takes approximately one hour. There are no restrictions for this procedure. ? ? ?Follow-Up: ?At [facility], you and your health needs are our priority.  As part of our continuing mission to provide you with exceptional heart care, we have created designated Provider Care Teams.  These Care Teams include your primary Cardiologist (physician) and Advanced Practice Providers (APPs -  Physician Assistants and Nurse Practitioners) who all work together to provide you with the care you need, when you need it. ? ?We recommend signing up for the patient portal called "MyChart".  Sign up information is provided on this After Visit Summary.  MyChart is used to connect with patients for Virtual Visits (Telemedicine).  Patients are able to view lab/test results, encounter notes, upcoming appointments, etc.  Non-urgent messages can be sent to your provider as well.   ?To learn more about what you can do with MyChart, go to [email or web address].   ? ?Your next appointment:   ?6  month(s) ? ?The format for your next appointment:   ?In Person ? ?Provider:   ?Mertie Moores, MD   ? ? ?Other Instructions ? ? ?Important Information About Sugar ? ? ? ? ?  ?

## 2021-12-04 NOTE — Telephone Encounter (Signed)
First shipment of Cabometyx was delivered 11/28/21. ?

## 2021-12-08 ENCOUNTER — Other Ambulatory Visit: Payer: Self-pay | Admitting: Oncology

## 2021-12-08 DIAGNOSIS — Z20822 Contact with and (suspected) exposure to covid-19: Secondary | ICD-10-CM | POA: Diagnosis not present

## 2021-12-10 ENCOUNTER — Other Ambulatory Visit (HOSPITAL_COMMUNITY): Payer: Medicare Other

## 2021-12-10 ENCOUNTER — Ambulatory Visit (HOSPITAL_COMMUNITY): Payer: Medicare Other

## 2021-12-10 DIAGNOSIS — Z20822 Contact with and (suspected) exposure to covid-19: Secondary | ICD-10-CM | POA: Diagnosis not present

## 2021-12-12 ENCOUNTER — Ambulatory Visit: Payer: Medicare Other

## 2021-12-12 ENCOUNTER — Inpatient Hospital Stay: Payer: Medicare Other | Attending: Oncology

## 2021-12-12 ENCOUNTER — Other Ambulatory Visit: Payer: Medicare Other

## 2021-12-12 ENCOUNTER — Inpatient Hospital Stay (HOSPITAL_BASED_OUTPATIENT_CLINIC_OR_DEPARTMENT_OTHER): Payer: Medicare Other | Admitting: Oncology

## 2021-12-12 ENCOUNTER — Encounter: Payer: Self-pay | Admitting: Oncology

## 2021-12-12 VITALS — BP 134/77 | HR 75 | Temp 98.2°F | Resp 18 | Ht 70.0 in | Wt 163.8 lb

## 2021-12-12 DIAGNOSIS — D3A8 Other benign neuroendocrine tumors: Secondary | ICD-10-CM

## 2021-12-12 DIAGNOSIS — C7B8 Other secondary neuroendocrine tumors: Secondary | ICD-10-CM | POA: Insufficient documentation

## 2021-12-12 DIAGNOSIS — R519 Headache, unspecified: Secondary | ICD-10-CM | POA: Diagnosis not present

## 2021-12-12 DIAGNOSIS — C7A8 Other malignant neuroendocrine tumors: Secondary | ICD-10-CM | POA: Insufficient documentation

## 2021-12-12 DIAGNOSIS — R531 Weakness: Secondary | ICD-10-CM | POA: Diagnosis not present

## 2021-12-12 DIAGNOSIS — R410 Disorientation, unspecified: Secondary | ICD-10-CM | POA: Insufficient documentation

## 2021-12-12 LAB — CBC WITH DIFFERENTIAL (CANCER CENTER ONLY)
Abs Immature Granulocytes: 0.01 10*3/uL (ref 0.00–0.07)
Basophils Absolute: 0 10*3/uL (ref 0.0–0.1)
Basophils Relative: 1 %
Eosinophils Absolute: 0.1 10*3/uL (ref 0.0–0.5)
Eosinophils Relative: 2 %
HCT: 37.2 % — ABNORMAL LOW (ref 39.0–52.0)
Hemoglobin: 12 g/dL — ABNORMAL LOW (ref 13.0–17.0)
Immature Granulocytes: 0 %
Lymphocytes Relative: 8 %
Lymphs Abs: 0.4 10*3/uL — ABNORMAL LOW (ref 0.7–4.0)
MCH: 32.2 pg (ref 26.0–34.0)
MCHC: 32.3 g/dL (ref 30.0–36.0)
MCV: 99.7 fL (ref 80.0–100.0)
Monocytes Absolute: 0.8 10*3/uL (ref 0.1–1.0)
Monocytes Relative: 15 %
Neutro Abs: 4 10*3/uL (ref 1.7–7.7)
Neutrophils Relative %: 74 %
Platelet Count: 141 10*3/uL — ABNORMAL LOW (ref 150–400)
RBC: 3.73 MIL/uL — ABNORMAL LOW (ref 4.22–5.81)
RDW: 14.1 % (ref 11.5–15.5)
WBC Count: 5.3 10*3/uL (ref 4.0–10.5)
nRBC: 0 % (ref 0.0–0.2)

## 2021-12-12 LAB — CMP (CANCER CENTER ONLY)
ALT: 14 U/L (ref 0–44)
AST: 22 U/L (ref 15–41)
Albumin: 4.1 g/dL (ref 3.5–5.0)
Alkaline Phosphatase: 67 U/L (ref 38–126)
Anion gap: 8 (ref 5–15)
BUN: 25 mg/dL — ABNORMAL HIGH (ref 8–23)
CO2: 31 mmol/L (ref 22–32)
Calcium: 9.7 mg/dL (ref 8.9–10.3)
Chloride: 100 mmol/L (ref 98–111)
Creatinine: 1.3 mg/dL — ABNORMAL HIGH (ref 0.61–1.24)
GFR, Estimated: 60 mL/min — ABNORMAL LOW (ref >60)
Glucose, Bld: 104 mg/dL — ABNORMAL HIGH (ref 70–99)
Potassium: 4.5 mmol/L (ref 3.5–5.1)
Sodium: 139 mmol/L (ref 135–145)
Total Bilirubin: 1.3 mg/dL — ABNORMAL HIGH (ref 0.3–1.2)
Total Protein: 6.7 g/dL (ref 6.5–8.1)

## 2021-12-12 NOTE — Progress Notes (Signed)
?[facility] ?OFFICE PROGRESS NOTE ? ? ?Diagnosis: Pancreas neuroendocrine tumor ? ?INTERVAL HISTORY:  ? ?Ronald Thompson has returned from a vacation to the beach.  He has mild malaise.  No new complaint.  He has been approved for patient assistance with cabozantinib.  He has received the first shipment of cabozantinib. ? ?Objective: ? ?Vital signs in last 24 hours: ? ?Blood pressure 134/77, pulse 75, temperature 98.2 ?F (36.8 ?C), temperature source Oral, resp. rate 18, height '5\' 10"'$  (1.778 m), weight 163 lb 12.8 oz (74.3 kg), SpO2 98 %. ?  ? ?Resp: Decreased breath sounds throughout the left chest, no respiratory distress ?Cardio: Regular rate and rhythm ?GI: Fullness in the right upper abdomen, nontender ?Vascular: No leg edema ? ? ?Lab Results: ? ?Lab Results  ?Component Value Date  ? WBC 5.3 12/12/2021  ? HGB 12.0 (L) 12/12/2021  ? HCT 37.2 (L) 12/12/2021  ? MCV 99.7 12/12/2021  ? PLT 141 (L) 12/12/2021  ? NEUTROABS 4.0 12/12/2021  ? ? ?CMP  ?Lab Results  ?Component Value Date  ? NA 139 12/12/2021  ? K 4.5 12/12/2021  ? CL 100 12/12/2021  ? CO2 31 12/12/2021  ? GLUCOSE 104 (H) 12/12/2021  ? BUN 25 (H) 12/12/2021  ? CREATININE 1.30 (H) 12/12/2021  ? CALCIUM 9.7 12/12/2021  ? PROT 6.7 12/12/2021  ? ALBUMIN 4.1 12/12/2021  ? AST 22 12/12/2021  ? ALT 14 12/12/2021  ? ALKPHOS 67 12/12/2021  ? BILITOT 1.3 (H) 12/12/2021  ? GFRNONAA 60 (L) 12/12/2021  ? GFRAA >60 01/19/2020  ? ? ? ?Medications: I have reviewed the patient's current medications. ? ? ?Assessment/Plan: ?Pancreatic neuroendocrine tumor, WHO grade 2, pancreatic head mass and small peripancreatic/celiac nodes on a CT 02/04/2014 ?EUS revealed evidence of multiple liver metastases-status post an FNA biopsy of a left liver lesion 02/10/2014 confirming a neuroendocrine tumor   ?Octreotide scan 04/01/2014 with multiple foci of metastatic neuroendocrine tumor in the liver ?Monthly Sandostatin started 03/16/2014 ?Restaging CT 07/04/2014 with resolution of  a previously noted pancreas head lesion and probable progression of liver metastases ?Restaging CT 10/31/2014-stable liver lesions, no evidence of a pancreas mass, stable. ?Restaging CT 02/28/2015-stable hepatic metastases, stable pancreas lesion unchanged ?Restaging CT 08/30/2015-stable pancreas mass, slight enlargement of hepatic metastases ?Monthly Sandostatin continued ?Restaging CTs 01/17/2016 revealed enlargement of liver lesions, no new lesions, enlargement of the pancreas head mass ?Gallium DOTATATE scan 10/01/2016 confirmed uptake in the liver metastases, pancreas primary, peripancreatic adenopathy, and left iliac bone ?Cycle 1 Lutathera 03/05/2017 ?Cycle 2 Lutathera 04/30/2017 ?Cycle 3 Lutathera 07/02/2017 ?Cycle 4 Lutathera 08/27/2017 ?Netspot scan 09/29/2017-decreased radiotracer activity within the pancreas head, peripancreatic lymph node, and solitary skeletal lesion.  Liver lesions have increased in size with a decrease in radiotracer activity ?Monthly Sandostatin continued ?Netspot scan 05/20/2018-stable to decreased size of liver lesions, most have decreased SUV, similar uptake in the pancreas lesion, previously noted peripancreatic lymph node has resolved him a mild decrease in uptake associated with a left iliac metastasis, no evidence of disease progression ?Monthly Sandostatin continued ?Netspot scan 01/29/2019- overall stable to improved, 1 lesion left hepatic lobe has increased tracer activity-unchanged in size, majority of hepatic lesions have reduced activity compared to the pretreatment scan, decreased activity left iliac bone lesion, stable activity in the head of the pancreas lesion, no new lesions ?Monthly Sandostatin continued ?Netspot on September 24, 2019-increase in size of 3 liver lesions with associated stable radiotracer activity, no new lesions, stable small left iliac lesion ?Cycle 1 salvage  Lutathera 10/27/2019 ?Cycle 2 salvage Lutathera 12/22/2019 ?Netspot on 02/15/2020-decrease in  radiotracer activity in the liver metastases with a decrease in size of several lesions, no new lesions, no progressive disease in the bowel or mesentery, decreased activity in the left iliac bone lesion ?Monthly Sandostatin continued  ?Netspot 10/13/2020-decrease in radiotracer activity in the left and right liver, decreased size of liver lesions, one lesion in the superior right liver has increased in size and has no radiotracer activity, no evidence of metastatic disease outside the liver ?PET 11/15/2020- moderate hypermetabolic activity associated with several enlarging liver lesions that had limited activity on the dotatate PET, a lesion in the left lateral hepatic lobe with mild persistent dotatate activity has mild peripheral FDG activity and is decreased in size from a dotatate PET 1 year ago.  No hypermetabolic activity in the pancreas or bowel.  No hypermetabolic activity in the previous dotatate avid left iliac lesion ?Monthly Sandostatin continued ?Cycle 1 capecitabine/temozolomide 01/22/2021 (capecitabine twice daily days 1 through 14 of each 28-day cycle/temozolomide days 10 through 14) ?Cycle 2 capecitabine/temozolomide 02/19/2021 ?Cycle 3 capecitabine/temozolomide 03/19/2021 ?Cycle 4 capecitabine/temozolomide 04/17/2021 ?PET 05/11/2021-slight increase in size of several FDG avid liver lesions, no new hepatic lesions, no evidence of metastatic disease to the chest or bones ?Cycle 5 capecitabine/temozolomide 05/16/2021 ?Cycle 6 capecitabine/temozolomide 06/25/2021, capecitabine dose reduced ?Cycle 7 capecitabine/temozolomide 07/23/2021 ?Cycle 8 capecitabine/temozolomide 08/20/2021 ?PET 09/13/2021-mildly progressive hepatic metastases ?Everolimus 10/11/2021-discontinued after 2 weeks secondary to cost ?Cabozantinib 12/12/2021 ?   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 chronic exertional dyspnea following treatment for the seminoma   ?5. aortic stenosis on an echocardiogram December 2011, severe aortic stenosis on echocardiogram 12/06/2015, status post TAVR on 04/16/2016 ?6. lumbar disc surgery 2014   ?7. acute abdominal pain 02/04/2014-resolved   ?8.  Diabetes ?9.  COVID-19 infection July 2022 ?  ? ? ? ?Disposition: ?Ronald Thompson has metastatic pancreas neuroendocrine carcinoma.  His clinical and radiologic evidence of disease progression.  The chromogranin A level is higher. ? ?Ronald Thompson was unable to obtain everolimus or sunitinib due to cost issues.  He has been approved for patient assistance with cabozantinib.  The plan is to proceed with cabozantinib today.  We reviewed potential toxicities associated with cabozantinib including the chance of hypertension, skin toxicity, hyperglycemia, diarrhea, and hematologic toxicity.  He has received teaching from the cancer center pharmacy. ? ?Ronald Thompson will return for an office and lab visit in 3 weeks.  He continues monthly Sandostatin. ? ?Betsy Coder, MD ? ?12/12/2021  ?1:53 PM ? ? ?

## 2021-12-14 ENCOUNTER — Encounter: Payer: Self-pay | Admitting: Oncology

## 2021-12-14 LAB — CHROMOGRANIN A: Chromogranin A (ng/mL): 21470 ng/mL — ABNORMAL HIGH (ref 0.0–101.8)

## 2021-12-18 ENCOUNTER — Ambulatory Visit (HOSPITAL_COMMUNITY): Payer: Medicare Other | Attending: Internal Medicine

## 2021-12-18 DIAGNOSIS — Z952 Presence of prosthetic heart valve: Secondary | ICD-10-CM | POA: Diagnosis not present

## 2021-12-18 LAB — ECHOCARDIOGRAM COMPLETE
AR max vel: 2.44 cm2
AV Area VTI: 2.68 cm2
AV Area mean vel: 2.48 cm2
AV Mean grad: 3.8 mmHg
AV Peak grad: 7.2 mmHg
Ao pk vel: 1.35 m/s
Area-P 1/2: 1.86 cm2
MV VTI: 1.1 cm2
S' Lateral: 2.3 cm

## 2021-12-20 ENCOUNTER — Other Ambulatory Visit: Payer: Self-pay

## 2021-12-20 DIAGNOSIS — J8410 Pulmonary fibrosis, unspecified: Secondary | ICD-10-CM

## 2021-12-21 ENCOUNTER — Telehealth: Payer: Self-pay

## 2021-12-21 NOTE — Telephone Encounter (Signed)
Pt called stated he is on cabozantinib and is concerned about his blood pressure Pt stated he is taking losartan 100 mg daily. Pt stated his blood pressure readings were 157/90, 154/93, and 177/109 Pt was not at home at the time. Informed him when he gets home to retake BP sitting down with legs uncrossed and arm straight and retake his blood pressure. Asked Pt if he was dizzy or lightheaded Pt denied any of these symptoms. Pt stated he will take his blood pressure again  when he gets home.

## 2021-12-25 ENCOUNTER — Inpatient Hospital Stay: Payer: Medicare Other

## 2021-12-25 ENCOUNTER — Encounter: Payer: Self-pay | Admitting: Nurse Practitioner

## 2021-12-25 ENCOUNTER — Inpatient Hospital Stay (HOSPITAL_BASED_OUTPATIENT_CLINIC_OR_DEPARTMENT_OTHER): Payer: Medicare Other | Admitting: Nurse Practitioner

## 2021-12-25 VITALS — BP 176/86 | HR 84 | Temp 98.8°F | Resp 18 | Wt 167.4 lb

## 2021-12-25 DIAGNOSIS — R410 Disorientation, unspecified: Secondary | ICD-10-CM | POA: Diagnosis not present

## 2021-12-25 DIAGNOSIS — D3A8 Other benign neuroendocrine tumors: Secondary | ICD-10-CM

## 2021-12-25 DIAGNOSIS — C7B8 Other secondary neuroendocrine tumors: Secondary | ICD-10-CM | POA: Diagnosis not present

## 2021-12-25 DIAGNOSIS — R519 Headache, unspecified: Secondary | ICD-10-CM | POA: Diagnosis not present

## 2021-12-25 DIAGNOSIS — C7A8 Other malignant neuroendocrine tumors: Secondary | ICD-10-CM | POA: Diagnosis not present

## 2021-12-25 DIAGNOSIS — R531 Weakness: Secondary | ICD-10-CM | POA: Diagnosis not present

## 2021-12-25 LAB — CMP (CANCER CENTER ONLY)
ALT: 21 U/L (ref 0–44)
AST: 29 U/L (ref 15–41)
Albumin: 3.9 g/dL (ref 3.5–5.0)
Alkaline Phosphatase: 69 U/L (ref 38–126)
Anion gap: 10 (ref 5–15)
BUN: 22 mg/dL (ref 8–23)
CO2: 28 mmol/L (ref 22–32)
Calcium: 9.3 mg/dL (ref 8.9–10.3)
Chloride: 102 mmol/L (ref 98–111)
Creatinine: 1.01 mg/dL (ref 0.61–1.24)
GFR, Estimated: >60 mL/min (ref >60)
Glucose, Bld: 92 mg/dL (ref 70–99)
Potassium: 4 mmol/L (ref 3.5–5.1)
Sodium: 140 mmol/L (ref 135–145)
Total Bilirubin: 0.9 mg/dL (ref 0.3–1.2)
Total Protein: 6.6 g/dL (ref 6.5–8.1)

## 2021-12-25 LAB — CBC WITH DIFFERENTIAL (CANCER CENTER ONLY)
Abs Immature Granulocytes: 0.01 10*3/uL (ref 0.00–0.07)
Basophils Absolute: 0 10*3/uL (ref 0.0–0.1)
Basophils Relative: 0 %
Eosinophils Absolute: 0.1 10*3/uL (ref 0.0–0.5)
Eosinophils Relative: 1 %
HCT: 38.5 % — ABNORMAL LOW (ref 39.0–52.0)
Hemoglobin: 12.7 g/dL — ABNORMAL LOW (ref 13.0–17.0)
Immature Granulocytes: 0 %
Lymphocytes Relative: 9 %
Lymphs Abs: 0.5 10*3/uL — ABNORMAL LOW (ref 0.7–4.0)
MCH: 31.6 pg (ref 26.0–34.0)
MCHC: 33 g/dL (ref 30.0–36.0)
MCV: 95.8 fL (ref 80.0–100.0)
Monocytes Absolute: 0.4 10*3/uL (ref 0.1–1.0)
Monocytes Relative: 8 %
Neutro Abs: 4.1 10*3/uL (ref 1.7–7.7)
Neutrophils Relative %: 82 %
Platelet Count: 124 10*3/uL — ABNORMAL LOW (ref 150–400)
RBC: 4.02 MIL/uL — ABNORMAL LOW (ref 4.22–5.81)
RDW: 14 % (ref 11.5–15.5)
WBC Count: 5 10*3/uL (ref 4.0–10.5)
nRBC: 0 % (ref 0.0–0.2)

## 2021-12-25 NOTE — Progress Notes (Signed)
[place] OFFICE PROGRESS NOTE   Diagnosis: Pancreas neuroendocrine tumor  INTERVAL HISTORY:   Ronald Thompson returns prior to scheduled follow-up.  He began cabozantinib 12/13/2021.  He noticed his blood pressure was higher than typical.  He developed headaches and shortness of breath.  He feels weak.  Pain at the right lateral abdomen above the hip.  Yesterday he noticed he had "trouble getting words out".  This resolved over about an hour.  No fever or cough.  No rash.  Stools are loose but not watery.  No bleeding.  No hand or foot pain or redness.  He has not taken his blood pressure medication for the past few days.  Objective:  Vital signs in last 24 hours:  Blood pressure (!) 176/86, pulse 84, temperature 98.8 F (37.1 C), temperature source Tympanic, resp. rate 18, weight 167 lb 6.4 oz (75.9 kg), SpO2 95 %.    HEENT: No thrush or ulcers.  Sclera anicteric. Resp: Diminished breath sounds left lung field.  No respiratory distress. Cardio: Regular rate and rhythm. GI: Liver palpable throughout the right upper abdomen. Vascular: No leg edema. Neuro: ?  Mildly confused.  Follows commands.  Moves all extremities. Skin: No rash.   Lab Results:  Lab Results  Component Value Date   WBC 5.3 12/12/2021   HGB 12.0 (L) 12/12/2021   HCT 37.2 (L) 12/12/2021   MCV 99.7 12/12/2021   PLT 141 (L) 12/12/2021   NEUTROABS 4.0 12/12/2021    Imaging:  No results found.  Medications: I have reviewed the patient's current medications.  Assessment/Plan: Pancreatic neuroendocrine tumor, WHO grade 2, pancreatic head mass and small peripancreatic/celiac nodes on a CT 02/04/2014 EUS revealed evidence of multiple liver metastases-status post an FNA biopsy of a left liver lesion 02/10/2014 confirming a neuroendocrine tumor   Octreotide scan 04/01/2014 with multiple foci of metastatic neuroendocrine tumor in the liver Monthly Sandostatin started 03/16/2014 Restaging CT 07/04/2014  with resolution of a previously noted pancreas head lesion and probable progression of liver metastases Restaging CT 10/31/2014-stable liver lesions, no evidence of a pancreas mass, stable. Restaging CT 02/28/2015-stable hepatic metastases, stable pancreas lesion unchanged Restaging CT 08/30/2015-stable pancreas mass, slight enlargement of hepatic metastases Monthly Sandostatin continued Restaging CTs 01/17/2016 revealed enlargement of liver lesions, no new lesions, enlargement of the pancreas head mass Gallium DOTATATE scan 10/01/2016 confirmed uptake in the liver metastases, pancreas primary, peripancreatic adenopathy, and left iliac bone Cycle 1 Lutathera 03/05/2017 Cycle 2 Lutathera 04/30/2017 Cycle 3 Lutathera 07/02/2017 Cycle 4 Lutathera 08/27/2017 Netspot scan 09/29/2017-decreased radiotracer activity within the pancreas head, peripancreatic lymph node, and solitary skeletal lesion.  Liver lesions have increased in size with a decrease in radiotracer activity Monthly Sandostatin continued Netspot scan 05/20/2018-stable to decreased size of liver lesions, most have decreased SUV, similar uptake in the pancreas lesion, previously noted peripancreatic lymph node has resolved him a mild decrease in uptake associated with a left iliac metastasis, no evidence of disease progression Monthly Sandostatin continued Netspot scan 01/29/2019- overall stable to improved, 1 lesion left hepatic lobe has increased tracer activity-unchanged in size, majority of hepatic lesions have reduced activity compared to the pretreatment scan, decreased activity left iliac bone lesion, stable activity in the head of the pancreas lesion, no new lesions Monthly Sandostatin continued Netspot on September 24, 2019-increase in size of 3 liver lesions with associated stable radiotracer activity, no new lesions, stable small left iliac lesion Cycle 1 salvage Lutathera 10/27/2019 Cycle 2 salvage Lutathera 12/22/2019 Netspot on  02/15/2020-decrease in radiotracer activity in the liver metastases with a decrease in size of several lesions, no new lesions, no progressive disease in the bowel or mesentery, decreased activity in the left iliac bone lesion Monthly Sandostatin continued  Netspot 10/13/2020-decrease in radiotracer activity in the left and right liver, decreased size of liver lesions, one lesion in the superior right liver has increased in size and has no radiotracer activity, no evidence of metastatic disease outside the liver PET 11/15/2020- moderate hypermetabolic activity associated with several enlarging liver lesions that had limited activity on the dotatate PET, a lesion in the left lateral hepatic lobe with mild persistent dotatate activity has mild peripheral FDG activity and is decreased in size from a dotatate PET 1 year ago.  No hypermetabolic activity in the pancreas or bowel.  No hypermetabolic activity in the previous dotatate avid left iliac lesion Monthly Sandostatin continued Cycle 1 capecitabine/temozolomide 01/22/2021 (capecitabine twice daily days 1 through 14 of each 28-day cycle/temozolomide days 10 through 14) Cycle 2 capecitabine/temozolomide 02/19/2021 Cycle 3 capecitabine/temozolomide 03/19/2021 Cycle 4 capecitabine/temozolomide 04/17/2021 PET 05/11/2021-slight increase in size of several FDG avid liver lesions, no new hepatic lesions, no evidence of metastatic disease to the chest or bones Cycle 5 capecitabine/temozolomide 05/16/2021 Cycle 6 capecitabine/temozolomide 06/25/2021, capecitabine dose reduced Cycle 7 capecitabine/temozolomide 07/23/2021 Cycle 8 capecitabine/temozolomide 08/20/2021 PET 09/13/2021-mildly progressive hepatic metastases Everolimus 10/11/2021-discontinued after 2 weeks secondary to cost Cabozantinib 12/13/2021 Cabozantinib discontinued 12/24/2021    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 chronic left chest wall/arm venous engorgement-presumably  related to chronic occlusion of the left subclavian/brachiocephalic vein (he reports being diagnosed with a left chest DVT in 1990)   4. chronic exertional dyspnea following treatment for the seminoma   5. aortic stenosis on an echocardiogram December 2011, severe aortic stenosis on echocardiogram 12/06/2015, status post TAVR on 04/16/2016 6. lumbar disc surgery 2014   7. acute abdominal pain 02/04/2014-resolved   8.  Diabetes 9.  COVID-19 infection July 2022    Disposition: Ronald Thompson has metastatic pancreas neuroendocrine tumor.  He began cabozantinib on 12/13/2021.  He discontinued yesterday due to elevated blood pressure, headache, weakness.  He had an episode of confusion.  He notes some improvement today.  He will continue to hold cabozantinib, follow-up as scheduled next week.  He will resume his blood pressure medication.  If symptoms do not continue to improve he will contact the office prior to that visit.  CBC and chemistry panel reviewed.  Patient seen with Dr. [name].    Ned Card ANP/GNP-BC   12/25/2021  1:48 PM This was a shared visit with Ned Card.  Ronald Thompson was interviewed and examined.  It is unclear whether his symptoms are related to cabozantinib or progression of the neuroendocrine tumor.  Cabozantinib will be placed on hold.  I was present for greater than 50% of today's visit.  I performed medical decision making.  [name], MD

## 2022-01-02 ENCOUNTER — Inpatient Hospital Stay (HOSPITAL_BASED_OUTPATIENT_CLINIC_OR_DEPARTMENT_OTHER): Payer: Medicare Other | Admitting: Nurse Practitioner

## 2022-01-02 ENCOUNTER — Other Ambulatory Visit: Payer: Self-pay | Admitting: Pharmacist

## 2022-01-02 ENCOUNTER — Encounter: Payer: Self-pay | Admitting: Nurse Practitioner

## 2022-01-02 ENCOUNTER — Other Ambulatory Visit (HOSPITAL_COMMUNITY): Payer: Self-pay

## 2022-01-02 ENCOUNTER — Inpatient Hospital Stay: Payer: Medicare Other

## 2022-01-02 VITALS — BP 154/80 | HR 73 | Temp 98.2°F | Resp 73 | Ht 70.0 in | Wt 166.2 lb

## 2022-01-02 DIAGNOSIS — C7A8 Other malignant neuroendocrine tumors: Secondary | ICD-10-CM | POA: Diagnosis not present

## 2022-01-02 DIAGNOSIS — D3A8 Other benign neuroendocrine tumors: Secondary | ICD-10-CM

## 2022-01-02 DIAGNOSIS — R410 Disorientation, unspecified: Secondary | ICD-10-CM | POA: Diagnosis not present

## 2022-01-02 DIAGNOSIS — R531 Weakness: Secondary | ICD-10-CM | POA: Diagnosis not present

## 2022-01-02 DIAGNOSIS — R519 Headache, unspecified: Secondary | ICD-10-CM | POA: Diagnosis not present

## 2022-01-02 DIAGNOSIS — R5383 Other fatigue: Secondary | ICD-10-CM

## 2022-01-02 DIAGNOSIS — C7B8 Other secondary neuroendocrine tumors: Secondary | ICD-10-CM | POA: Diagnosis not present

## 2022-01-02 LAB — CBC WITH DIFFERENTIAL (CANCER CENTER ONLY)
Abs Immature Granulocytes: 0 10*3/uL (ref 0.00–0.07)
Basophils Absolute: 0 10*3/uL (ref 0.0–0.1)
Basophils Relative: 1 %
Eosinophils Absolute: 0.1 10*3/uL (ref 0.0–0.5)
Eosinophils Relative: 3 %
HCT: 37.9 % — ABNORMAL LOW (ref 39.0–52.0)
Hemoglobin: 12.6 g/dL — ABNORMAL LOW (ref 13.0–17.0)
Immature Granulocytes: 0 %
Lymphocytes Relative: 9 %
Lymphs Abs: 0.3 10*3/uL — ABNORMAL LOW (ref 0.7–4.0)
MCH: 32.5 pg (ref 26.0–34.0)
MCHC: 33.2 g/dL (ref 30.0–36.0)
MCV: 97.7 fL (ref 80.0–100.0)
Monocytes Absolute: 0.6 10*3/uL (ref 0.1–1.0)
Monocytes Relative: 14 %
Neutro Abs: 2.8 10*3/uL (ref 1.7–7.7)
Neutrophils Relative %: 73 %
Platelet Count: 99 10*3/uL — ABNORMAL LOW (ref 150–400)
RBC: 3.88 MIL/uL — ABNORMAL LOW (ref 4.22–5.81)
RDW: 14.6 % (ref 11.5–15.5)
WBC Count: 3.8 10*3/uL — ABNORMAL LOW (ref 4.0–10.5)
nRBC: 0 % (ref 0.0–0.2)

## 2022-01-02 LAB — CMP (CANCER CENTER ONLY)
ALT: 18 U/L (ref 0–44)
AST: 29 U/L (ref 15–41)
Albumin: 4 g/dL (ref 3.5–5.0)
Alkaline Phosphatase: 68 U/L (ref 38–126)
Anion gap: 9 (ref 5–15)
BUN: 26 mg/dL — ABNORMAL HIGH (ref 8–23)
CO2: 32 mmol/L (ref 22–32)
Calcium: 9.5 mg/dL (ref 8.9–10.3)
Chloride: 100 mmol/L (ref 98–111)
Creatinine: 1.15 mg/dL (ref 0.61–1.24)
GFR, Estimated: >60 mL/min (ref >60)
Glucose, Bld: 99 mg/dL (ref 70–99)
Potassium: 4.4 mmol/L (ref 3.5–5.1)
Sodium: 141 mmol/L (ref 135–145)
Total Bilirubin: 1.4 mg/dL — ABNORMAL HIGH (ref 0.3–1.2)
Total Protein: 6.8 g/dL (ref 6.5–8.1)

## 2022-01-02 LAB — MAGNESIUM: Magnesium: 1.9 mg/dL (ref 1.7–2.4)

## 2022-01-02 MED ORDER — CABOZANTINIB S-MALATE 20 MG PO TABS
20.0000 mg | ORAL_TABLET | Freq: Every day | ORAL | 1 refills | Status: DC
  Filled 2022-01-02: qty 30, 30d supply, fill #0

## 2022-01-02 MED ORDER — CABOZANTINIB S-MALATE 20 MG PO TABS: 20.0000 mg | ORAL_TABLET | Freq: Every day | ORAL | 1 refills | Status: DC

## 2022-01-02 MED ORDER — OCTREOTIDE ACETATE 30 MG IM KIT
30.0000 mg | PACK | Freq: Once | INTRAMUSCULAR | Status: AC
  Administered 2022-01-02: 30 mg via INTRAMUSCULAR
  Filled 2022-01-02: qty 1

## 2022-01-02 NOTE — Patient Instructions (Signed)
Octreotide injection solution ?What is this medication? ?OCTREOTIDE (ok TREE oh tide) is used to reduce blood levels of growth hormone in patients with a condition called acromegaly. This medicine also reduces flushing and watery diarrhea caused by certain types of cancer. ?This medicine may be used for other purposes; ask your health care provider or pharmacist if you have questions. ?COMMON BRAND NAME(S): Bynfezia, Sandostatin ?What should I tell my care team before I take this medication? ?They need to know if you have any of these conditions: ?diabetes ?gallbladder disease ?kidney disease ?liver disease ?thyroid disease ?an unusual or allergic reaction to octreotide, other medicines, foods, dyes, or preservatives ?pregnant or trying to get pregnant ?breast-feeding ?How should I use this medication? ?This medication is injected under the skin or into a vein. It is usually given by your care team in a hospital or clinic setting. ?If you get this medication at home, you will be taught how to prepare and give it. Use exactly as directed. Take it as directed on the prescription label at the same time every day. Keep taking it unless your care team tells you to stop. ?Allow the injection solution to come to room temperature before use. Do not warm it artificially. ?It is important that you put your used needles and syringes in a special sharps container. Do not put them in a trash can. If you do not have a sharps container, call your pharmacist or care team to get one. ?Talk to your care team about the use of this medication in children. Special care may be needed. ?Overdosage: If you think you have taken too much of this medicine contact a poison control center or emergency room at once. ?NOTE: This medicine is only for you. Do not share this medicine with others. ?What if I miss a dose? ?If you miss a dose, take it as soon as you can. If it is almost time for your next dose, take only that dose. Do not take double  or extra doses. ?What may interact with this medication? ?bromocriptine ?certain medicines for blood pressure, heart disease, irregular heartbeat ?cyclosporine ?diuretics ?medicines for diabetes, including insulin ?quinidine ?This list may not describe all possible interactions. Give your health care provider a list of all the medicines, herbs, non-prescription drugs, or dietary supplements you use. Also tell them if you smoke, drink alcohol, or use illegal drugs. Some items may interact with your medicine. ?What should I watch for while using this medication? ?Visit your care team for regular checks on your progress. Tell your care team if your symptoms do not start to get better or if they get worse. ?To help reduce irritation at the injection site, use a different site for each injection and make sure the solution is at room temperature before use. ?This medication may cause decreases in blood sugar. Signs of low blood sugar include chills, cool, pale skin or cold sweats, drowsiness, extreme hunger, fast heartbeat, headache, nausea, nervousness or anxiety, shakiness, trembling, unsteadiness, tiredness, or weakness. Contact your care team right away if you experience any of these symptoms. ?This medication may increase blood sugar. The risk may be higher in patients who already have diabetes. Ask your care team what you can do to lower your risk of diabetes while taking this medication. ?You should make sure you get enough vitamin B12 while you are taking this medication. Discuss the foods you eat and the vitamins you take with your care team. ?What side effects may I notice from receiving   this medication? ?Side effects that you should report to your doctor or health care professional as soon as possible: ?allergic reactions like skin rash, itching or hives, swelling of the face, lips, or tongue ?fast, slow, or irregular heartbeat ?right upper belly pain ?severe stomach pain ?signs and symptoms of high blood sugar  such as being more thirsty or hungry or having to urinate more than normal. You may also feel very tired or have blurry vision. ?signs and symptoms of low blood sugar such as feeling anxious; confusion; dizziness; increased hunger; unusually weak or tired; increased sweating; shakiness; cold, clammy skin; irritable; headache; blurred vision; fast heartbeat; loss of consciousness ?unusually weak or tired ?Side effects that usually do not require medical attention (report to your doctor or health care professional if they continue or are bothersome): ?diarrhea ?dizziness ?gas ?headache ?nausea, vomiting ?pain, redness, or irritation at site where injected ?upset stomach ?This list may not describe all possible side effects. Call your doctor for medical advice about side effects. You may report side effects to FDA at 1-800-FDA-1088. ?Where should I keep my medication? ?Keep out of the reach of children and pets. ?Store in the refrigerator. Protect from light. Allow to come to room temperature naturally. Do not use artificial heat. If protected from light, the injection may be stored between 20 and 30 degrees C (70 and 86 degrees F) for 14 days. After the initial use, throw away any unused portion of a multiple dose vial after 14 days. Get rid of any unused portions of the ampules after use. ?To get rid of medications that are no longer needed or have expired: ?Take the medication to a medication take-back program. Ask your pharmacy or law enforcement to find a location. ?If you cannot return the medication, ask your pharmacist or care team how to get rid of the medication safely. ?NOTE: This sheet is a summary. It may not cover all possible information. If you have questions about this medicine, talk to your doctor, pharmacist, or health care provider. ?? 2023 Elsevier/Gold Standard (2021-07-13 00:00:00) ? ?

## 2022-01-02 NOTE — Progress Notes (Unsigned)
[place] OFFICE PROGRESS NOTE   Diagnosis:  Pancreas neuroendocrine tumor  INTERVAL HISTORY:   [name] returns as scheduled.  Cabozantinib was placed on hold last week due to elevated blood pressure, headache, weakness.  He is feeling much better.  He is no longer having headaches.  He feels stronger.  No nausea or vomiting.  No diarrhea.  No rash.  No more episodes of confusion.  Objective:  Vital signs in last 24 hours:  Blood pressure (!) 154/80, pulse 73, temperature 98.2 F (36.8 C), temperature source Oral, resp. rate (!) 73, height '5\' 10"'$  (1.778 m), weight 166 lb 3.2 oz (75.4 kg), SpO2 100 %.    HEENT: No thrush or ulcers. Resp: Lungs clear bilaterally.  Stable diminished breath sounds left lung field.  No respiratory distress. Cardio: Regular rate and rhythm. GI: Liver palpable throughout the right upper abdomen. Vascular: No leg edema. Neuro: Alert and oriented. Skin: No rash.   Lab Results:  Lab Results  Component Value Date   WBC 3.8 (L) 01/02/2022   HGB 12.6 (L) 01/02/2022   HCT 37.9 (L) 01/02/2022   MCV 97.7 01/02/2022   PLT 99 (L) 01/02/2022   NEUTROABS 2.8 01/02/2022    Imaging:  No results found.  Medications: I have reviewed the patient's current medications.  Assessment/Plan: Pancreatic neuroendocrine tumor, WHO grade 2, pancreatic head mass and small peripancreatic/celiac nodes on a CT 02/04/2014 EUS revealed evidence of multiple liver metastases-status post an FNA biopsy of a left liver lesion 02/10/2014 confirming a neuroendocrine tumor   Octreotide scan 04/01/2014 with multiple foci of metastatic neuroendocrine tumor in the liver Monthly Sandostatin started 03/16/2014 Restaging CT 07/04/2014 with resolution of a previously noted pancreas head lesion and probable progression of liver metastases Restaging CT 10/31/2014-stable liver lesions, no evidence of a pancreas mass, stable. Restaging CT 02/28/2015-stable hepatic  metastases, stable pancreas lesion unchanged Restaging CT 08/30/2015-stable pancreas mass, slight enlargement of hepatic metastases Monthly Sandostatin continued Restaging CTs 01/17/2016 revealed enlargement of liver lesions, no new lesions, enlargement of the pancreas head mass Gallium DOTATATE scan 10/01/2016 confirmed uptake in the liver metastases, pancreas primary, peripancreatic adenopathy, and left iliac bone Cycle 1 Lutathera 03/05/2017 Cycle 2 Lutathera 04/30/2017 Cycle 3 Lutathera 07/02/2017 Cycle 4 Lutathera 08/27/2017 Netspot scan 09/29/2017-decreased radiotracer activity within the pancreas head, peripancreatic lymph node, and solitary skeletal lesion.  Liver lesions have increased in size with a decrease in radiotracer activity Monthly Sandostatin continued Netspot scan 05/20/2018-stable to decreased size of liver lesions, most have decreased SUV, similar uptake in the pancreas lesion, previously noted peripancreatic lymph node has resolved him a mild decrease in uptake associated with a left iliac metastasis, no evidence of disease progression Monthly Sandostatin continued Netspot scan 01/29/2019- overall stable to improved, 1 lesion left hepatic lobe has increased tracer activity-unchanged in size, majority of hepatic lesions have reduced activity compared to the pretreatment scan, decreased activity left iliac bone lesion, stable activity in the head of the pancreas lesion, no new lesions Monthly Sandostatin continued Netspot on September 24, 2019-increase in size of 3 liver lesions with associated stable radiotracer activity, no new lesions, stable small left iliac lesion Cycle 1 salvage Lutathera 10/27/2019 Cycle 2 salvage Lutathera 12/22/2019 Netspot on 02/15/2020-decrease in radiotracer activity in the liver metastases with a decrease in size of several lesions, no new lesions, no progressive disease in the bowel or mesentery, decreased activity in the left iliac bone lesion Monthly  Sandostatin continued  Netspot 10/13/2020-decrease in radiotracer activity in  the left and right liver, decreased size of liver lesions, one lesion in the superior right liver has increased in size and has no radiotracer activity, no evidence of metastatic disease outside the liver PET 11/15/2020- moderate hypermetabolic activity associated with several enlarging liver lesions that had limited activity on the dotatate PET, a lesion in the left lateral hepatic lobe with mild persistent dotatate activity has mild peripheral FDG activity and is decreased in size from a dotatate PET 1 year ago.  No hypermetabolic activity in the pancreas or bowel.  No hypermetabolic activity in the previous dotatate avid left iliac lesion Monthly Sandostatin continued Cycle 1 capecitabine/temozolomide 01/22/2021 (capecitabine twice daily days 1 through 14 of each 28-day cycle/temozolomide days 10 through 14) Cycle 2 capecitabine/temozolomide 02/19/2021 Cycle 3 capecitabine/temozolomide 03/19/2021 Cycle 4 capecitabine/temozolomide 04/17/2021 PET 05/11/2021-slight increase in size of several FDG avid liver lesions, no new hepatic lesions, no evidence of metastatic disease to the chest or bones Cycle 5 capecitabine/temozolomide 05/16/2021 Cycle 6 capecitabine/temozolomide 06/25/2021, capecitabine dose reduced Cycle 7 capecitabine/temozolomide 07/23/2021 Cycle 8 capecitabine/temozolomide 08/20/2021 PET 09/13/2021-mildly progressive hepatic metastases Everolimus 10/11/2021-discontinued after 2 weeks secondary to cost Cabozantinib 12/13/2021 Cabozantinib discontinued 12/24/2021 Cabozantinib resumed at a reduced dose of 20 mg daily beginning 01/03/2022    Chest Seminoma in 1990 treated with BEP and chest radiation at [facility] 3. 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 chronic exertional  dyspnea following treatment for the seminoma   5. aortic stenosis on an echocardiogram December 2011, severe aortic stenosis on echocardiogram 12/06/2015, status post TAVR on 04/16/2016 6. lumbar disc surgery 2014   7. acute abdominal pain 02/04/2014-resolved   8.  Diabetes 9.  COVID-19 infection July 2022  Disposition: Mr. Bhagat appears improved.  We discussed resuming cabozantinib at a reduced dose of 20 mg daily.  He agrees with this plan.  We will send a new prescription to the pharmacy with the plan to begin 01/03/2022.  He will discontinue cabozantinib and contact the office with recurrence of previous symptoms.  CBC and chemistry panel reviewed.  Labs adequate to resume cabozantinib.  He will receive a Sandostatin injection today.  Lab and follow-up in 2 weeks.  Patient seen with Dr. [name].    Ned Card ANP/GNP-BC   01/02/2022  9:03 AM  This was a shared visit with Ned Card.  Mr. Alcorn has experienced improvement in his clinical status while off of cabozantinib.  We discussed treatment options.  The plan is to resume cabozantinib at a reduced dose.  Treatment will be changed to FOLFOX if he is unable to tolerate the reduced dose cabozantinib.  [name], MD

## 2022-01-04 ENCOUNTER — Other Ambulatory Visit (HOSPITAL_COMMUNITY): Payer: Self-pay

## 2022-01-16 ENCOUNTER — Encounter: Payer: Self-pay | Admitting: Nurse Practitioner

## 2022-01-16 ENCOUNTER — Other Ambulatory Visit: Payer: Self-pay

## 2022-01-16 ENCOUNTER — Inpatient Hospital Stay: Payer: Medicare Other | Attending: Oncology

## 2022-01-16 ENCOUNTER — Inpatient Hospital Stay (HOSPITAL_BASED_OUTPATIENT_CLINIC_OR_DEPARTMENT_OTHER): Payer: Medicare Other | Admitting: Nurse Practitioner

## 2022-01-16 VITALS — BP 152/94 | HR 92 | Temp 98.2°F | Resp 18 | Ht 70.0 in | Wt 162.2 lb

## 2022-01-16 DIAGNOSIS — C7A8 Other malignant neuroendocrine tumors: Secondary | ICD-10-CM | POA: Diagnosis not present

## 2022-01-16 DIAGNOSIS — R630 Anorexia: Secondary | ICD-10-CM | POA: Diagnosis not present

## 2022-01-16 DIAGNOSIS — R5381 Other malaise: Secondary | ICD-10-CM | POA: Diagnosis not present

## 2022-01-16 DIAGNOSIS — C7B8 Other secondary neuroendocrine tumors: Secondary | ICD-10-CM | POA: Insufficient documentation

## 2022-01-16 DIAGNOSIS — D3A8 Other benign neuroendocrine tumors: Secondary | ICD-10-CM

## 2022-01-16 DIAGNOSIS — Z79899 Other long term (current) drug therapy: Secondary | ICD-10-CM | POA: Insufficient documentation

## 2022-01-16 DIAGNOSIS — R5383 Other fatigue: Secondary | ICD-10-CM

## 2022-01-16 LAB — CMP (CANCER CENTER ONLY)
ALT: 20 U/L (ref 0–44)
AST: 29 U/L (ref 15–41)
Albumin: 4.1 g/dL (ref 3.5–5.0)
Alkaline Phosphatase: 75 U/L (ref 38–126)
Anion gap: 10 (ref 5–15)
BUN: 21 mg/dL (ref 8–23)
CO2: 31 mmol/L (ref 22–32)
Calcium: 9.8 mg/dL (ref 8.9–10.3)
Chloride: 99 mmol/L (ref 98–111)
Creatinine: 1.2 mg/dL (ref 0.61–1.24)
GFR, Estimated: >60 mL/min (ref >60)
Glucose, Bld: 92 mg/dL (ref 70–99)
Potassium: 4.3 mmol/L (ref 3.5–5.1)
Sodium: 140 mmol/L (ref 135–145)
Total Bilirubin: 1 mg/dL (ref 0.3–1.2)
Total Protein: 6.7 g/dL (ref 6.5–8.1)

## 2022-01-16 LAB — CBC WITH DIFFERENTIAL (CANCER CENTER ONLY)
Abs Immature Granulocytes: 0 10*3/uL (ref 0.00–0.07)
Basophils Absolute: 0 10*3/uL (ref 0.0–0.1)
Basophils Relative: 1 %
Eosinophils Absolute: 0.1 10*3/uL (ref 0.0–0.5)
Eosinophils Relative: 2 %
HCT: 37.2 % — ABNORMAL LOW (ref 39.0–52.0)
Hemoglobin: 12.2 g/dL — ABNORMAL LOW (ref 13.0–17.0)
Immature Granulocytes: 0 %
Lymphocytes Relative: 9 %
Lymphs Abs: 0.4 10*3/uL — ABNORMAL LOW (ref 0.7–4.0)
MCH: 32.3 pg (ref 26.0–34.0)
MCHC: 32.8 g/dL (ref 30.0–36.0)
MCV: 98.4 fL (ref 80.0–100.0)
Monocytes Absolute: 0.5 10*3/uL (ref 0.1–1.0)
Monocytes Relative: 13 %
Neutro Abs: 3.1 10*3/uL (ref 1.7–7.7)
Neutrophils Relative %: 75 %
Platelet Count: 143 10*3/uL — ABNORMAL LOW (ref 150–400)
RBC: 3.78 MIL/uL — ABNORMAL LOW (ref 4.22–5.81)
RDW: 15 % (ref 11.5–15.5)
WBC Count: 4.1 10*3/uL (ref 4.0–10.5)
nRBC: 0 % (ref 0.0–0.2)

## 2022-01-16 LAB — TSH: TSH: 2.098 u[IU]/mL (ref 0.350–4.500)

## 2022-01-16 LAB — TOTAL PROTEIN, URINE DIPSTICK

## 2022-01-16 NOTE — Progress Notes (Signed)
[place] OFFICE PROGRESS NOTE   Diagnosis: Pancreas neuroendocrine tumor  INTERVAL HISTORY:   Ronald Thompson returns as scheduled.  He resumed cabozantinib at a reduced dose of 20 mg daily beginning 01/12/2022.  He denies nausea/vomiting.  No mouth sores.  No diarrhea.  No rash.  No hand or foot pain or redness.  No headaches.  No dizziness.  He is no longer having right-sided abdominal pain.  Objective:  Vital signs in last 24 hours:  Blood pressure (!) 152/94, pulse 92, temperature 98.2 F (36.8 C), temperature source Oral, resp. rate 18, height '5\' 10"'$  (1.778 m), weight 162 lb 3.2 oz (73.6 kg), SpO2 96 %.    HEENT: No thrush or ulcers. Resp: Decreased breath sounds about the left chest.  No respiratory distress. Cardio: Regular rate and rhythm. GI: Liver palpable throughout the right upper abdomen and crossing midline. Vascular: No leg edema. Skin: No rash.  Palms without erythema.   Lab Results:  Lab Results  Component Value Date   WBC 4.1 01/16/2022   HGB 12.2 (L) 01/16/2022   HCT 37.2 (L) 01/16/2022   MCV 98.4 01/16/2022   PLT 143 (L) 01/16/2022   NEUTROABS 3.1 01/16/2022    Imaging:  No results found.  Medications: I have reviewed the patient's current medications.  Assessment/Plan: Pancreatic neuroendocrine tumor, WHO grade 2, pancreatic head mass and small peripancreatic/celiac nodes on a CT 02/04/2014 EUS revealed evidence of multiple liver metastases-status post an FNA biopsy of a left liver lesion 02/10/2014 confirming a neuroendocrine tumor   Octreotide scan 04/01/2014 with multiple foci of metastatic neuroendocrine tumor in the liver Monthly Sandostatin started 03/16/2014 Restaging CT 07/04/2014 with resolution of a previously noted pancreas head lesion and probable progression of liver metastases Restaging CT 10/31/2014-stable liver lesions, no evidence of a pancreas mass, stable. Restaging CT 02/28/2015-stable hepatic metastases, stable  pancreas lesion unchanged Restaging CT 08/30/2015-stable pancreas mass, slight enlargement of hepatic metastases Monthly Sandostatin continued Restaging CTs 01/17/2016 revealed enlargement of liver lesions, no new lesions, enlargement of the pancreas head mass Gallium DOTATATE scan 10/01/2016 confirmed uptake in the liver metastases, pancreas primary, peripancreatic adenopathy, and left iliac bone Cycle 1 Lutathera 03/05/2017 Cycle 2 Lutathera 04/30/2017 Cycle 3 Lutathera 07/02/2017 Cycle 4 Lutathera 08/27/2017 Netspot scan 09/29/2017-decreased radiotracer activity within the pancreas head, peripancreatic lymph node, and solitary skeletal lesion.  Liver lesions have increased in size with a decrease in radiotracer activity Monthly Sandostatin continued Netspot scan 05/20/2018-stable to decreased size of liver lesions, most have decreased SUV, similar uptake in the pancreas lesion, previously noted peripancreatic lymph node has resolved him a mild decrease in uptake associated with a left iliac metastasis, no evidence of disease progression Monthly Sandostatin continued Netspot scan 01/29/2019- overall stable to improved, 1 lesion left hepatic lobe has increased tracer activity-unchanged in size, majority of hepatic lesions have reduced activity compared to the pretreatment scan, decreased activity left iliac bone lesion, stable activity in the head of the pancreas lesion, no new lesions Monthly Sandostatin continued Netspot on September 24, 2019-increase in size of 3 liver lesions with associated stable radiotracer activity, no new lesions, stable small left iliac lesion Cycle 1 salvage Lutathera 10/27/2019 Cycle 2 salvage Lutathera 12/22/2019 Netspot on 02/15/2020-decrease in radiotracer activity in the liver metastases with a decrease in size of several lesions, no new lesions, no progressive disease in the bowel or mesentery, decreased activity in the left iliac bone lesion Monthly Sandostatin  continued  Netspot 10/13/2020-decrease in radiotracer activity in the left  and right liver, decreased size of liver lesions, one lesion in the superior right liver has increased in size and has no radiotracer activity, no evidence of metastatic disease outside the liver PET 11/15/2020- moderate hypermetabolic activity associated with several enlarging liver lesions that had limited activity on the dotatate PET, a lesion in the left lateral hepatic lobe with mild persistent dotatate activity has mild peripheral FDG activity and is decreased in size from a dotatate PET 1 year ago.  No hypermetabolic activity in the pancreas or bowel.  No hypermetabolic activity in the previous dotatate avid left iliac lesion Monthly Sandostatin continued Cycle 1 capecitabine/temozolomide 01/22/2021 (capecitabine twice daily days 1 through 14 of each 28-day cycle/temozolomide days 10 through 14) Cycle 2 capecitabine/temozolomide 02/19/2021 Cycle 3 capecitabine/temozolomide 03/19/2021 Cycle 4 capecitabine/temozolomide 04/17/2021 PET 05/11/2021-slight increase in size of several FDG avid liver lesions, no new hepatic lesions, no evidence of metastatic disease to the chest or bones Cycle 5 capecitabine/temozolomide 05/16/2021 Cycle 6 capecitabine/temozolomide 06/25/2021, capecitabine dose reduced Cycle 7 capecitabine/temozolomide 07/23/2021 Cycle 8 capecitabine/temozolomide 08/20/2021 PET 09/13/2021-mildly progressive hepatic metastases Everolimus 10/11/2021-discontinued after 2 weeks secondary to cost Cabozantinib 12/13/2021 Cabozantinib discontinued 12/24/2021 Cabozantinib resumed at a reduced dose of 20 mg daily beginning 01/12/2022    Chest Seminoma in 1990 treated with BEP and chest radiation at [facility] 3. 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 chronic exertional dyspnea  following treatment for the seminoma   5. aortic stenosis on an echocardiogram December 2011, severe aortic stenosis on echocardiogram 12/06/2015, status post TAVR on 04/16/2016 6. lumbar disc surgery 2014   7. acute abdominal pain 02/04/2014-resolved   8.  Diabetes 9.  COVID-19 infection July 2022  Disposition: Ronald Thompson appears stable.  He resumed cabozantinib at a reduced dose of 20 mg daily on 01/12/2022.  So far he is tolerating well.  He will continue at the same dose.  We reviewed the CBC and chemistry panel from today.  Labs adequate to continue with treatment.  Urine with trace protein.  We will repeat in 2 weeks.  He will return for lab, follow-up, Sandostatin in 2 weeks.  We are available to see him sooner if needed    Ned Card ANP/GNP-BC   01/16/2022  9:32 AM

## 2022-01-17 ENCOUNTER — Ambulatory Visit (HOSPITAL_COMMUNITY)
Admission: RE | Admit: 2022-01-17 | Discharge: 2022-01-17 | Disposition: A | Discharge status: Home / Self Care | Payer: Medicare Other | Source: Ambulatory Visit | Attending: Physician Assistant | Admitting: Physician Assistant

## 2022-01-17 DIAGNOSIS — J90 Pleural effusion, not elsewhere classified: Secondary | ICD-10-CM | POA: Diagnosis not present

## 2022-01-17 DIAGNOSIS — J8410 Pulmonary fibrosis, unspecified: Secondary | ICD-10-CM | POA: Diagnosis not present

## 2022-01-17 DIAGNOSIS — J9811 Atelectasis: Secondary | ICD-10-CM | POA: Diagnosis not present

## 2022-01-17 DIAGNOSIS — R7989 Other specified abnormal findings of blood chemistry: Secondary | ICD-10-CM | POA: Diagnosis not present

## 2022-01-17 DIAGNOSIS — I270 Primary pulmonary hypertension: Secondary | ICD-10-CM | POA: Diagnosis not present

## 2022-01-17 MED ORDER — IOHEXOL 350 MG/ML SOLN
80.0000 mL | Freq: Once | INTRAVENOUS | Status: AC | PRN
  Administered 2022-01-17: 80 mL via INTRAVENOUS

## 2022-01-22 ENCOUNTER — Telehealth: Payer: Self-pay

## 2022-01-22 NOTE — Telephone Encounter (Signed)
TC from Pt stated he was having diarrhea. Asked Pt how many times did he go today and does it have form. Pt stated the stool is very soft, he went the three times this morning. Twice last night. Pt asked if he should take imodium. Informed Pt to start taking imodium and can take up to 6 a day. Discuss BRAT diet with Pt and informed him to stay away from dairy products.And if diarrhea should not resolve. Give a return call to the office. Pt verbalized understanding

## 2022-01-30 ENCOUNTER — Ambulatory Visit: Payer: Medicare Other | Admitting: Nutrition

## 2022-01-30 ENCOUNTER — Inpatient Hospital Stay: Payer: Medicare Other

## 2022-01-30 ENCOUNTER — Inpatient Hospital Stay (HOSPITAL_BASED_OUTPATIENT_CLINIC_OR_DEPARTMENT_OTHER): Payer: Medicare Other | Admitting: Oncology

## 2022-01-30 VITALS — BP 156/92 | HR 87 | Temp 98.1°F | Resp 18 | Ht 70.0 in | Wt 160.2 lb

## 2022-01-30 DIAGNOSIS — C7A8 Other malignant neuroendocrine tumors: Secondary | ICD-10-CM | POA: Diagnosis not present

## 2022-01-30 DIAGNOSIS — D3A8 Other benign neuroendocrine tumors: Secondary | ICD-10-CM

## 2022-01-30 DIAGNOSIS — Z79899 Other long term (current) drug therapy: Secondary | ICD-10-CM | POA: Diagnosis not present

## 2022-01-30 DIAGNOSIS — C7B8 Other secondary neuroendocrine tumors: Secondary | ICD-10-CM | POA: Diagnosis not present

## 2022-01-30 DIAGNOSIS — R5381 Other malaise: Secondary | ICD-10-CM | POA: Diagnosis not present

## 2022-01-30 DIAGNOSIS — R630 Anorexia: Secondary | ICD-10-CM | POA: Diagnosis not present

## 2022-01-30 LAB — CBC WITH DIFFERENTIAL (CANCER CENTER ONLY)
Abs Immature Granulocytes: 0 10*3/uL (ref 0.00–0.07)
Basophils Absolute: 0 10*3/uL (ref 0.0–0.1)
Basophils Relative: 1 %
Eosinophils Absolute: 0.1 10*3/uL (ref 0.0–0.5)
Eosinophils Relative: 2 %
HCT: 38.8 % — ABNORMAL LOW (ref 39.0–52.0)
Hemoglobin: 12.6 g/dL — ABNORMAL LOW (ref 13.0–17.0)
Immature Granulocytes: 0 %
Lymphocytes Relative: 11 %
Lymphs Abs: 0.5 10*3/uL — ABNORMAL LOW (ref 0.7–4.0)
MCH: 32 pg (ref 26.0–34.0)
MCHC: 32.5 g/dL (ref 30.0–36.0)
MCV: 98.5 fL (ref 80.0–100.0)
Monocytes Absolute: 0.4 10*3/uL (ref 0.1–1.0)
Monocytes Relative: 9 %
Neutro Abs: 3.2 10*3/uL (ref 1.7–7.7)
Neutrophils Relative %: 77 %
Platelet Count: 120 10*3/uL — ABNORMAL LOW (ref 150–400)
RBC: 3.94 MIL/uL — ABNORMAL LOW (ref 4.22–5.81)
RDW: 14.9 % (ref 11.5–15.5)
WBC Count: 4.1 10*3/uL (ref 4.0–10.5)
nRBC: 0 % (ref 0.0–0.2)

## 2022-01-30 LAB — CMP (CANCER CENTER ONLY)
ALT: 24 U/L (ref 0–44)
AST: 31 U/L (ref 15–41)
Albumin: 4.2 g/dL (ref 3.5–5.0)
Alkaline Phosphatase: 72 U/L (ref 38–126)
Anion gap: 8 (ref 5–15)
BUN: 23 mg/dL (ref 8–23)
CO2: 30 mmol/L (ref 22–32)
Calcium: 9.7 mg/dL (ref 8.9–10.3)
Chloride: 103 mmol/L (ref 98–111)
Creatinine: 1.16 mg/dL (ref 0.61–1.24)
GFR, Estimated: >60 mL/min (ref >60)
Glucose, Bld: 133 mg/dL — ABNORMAL HIGH (ref 70–99)
Potassium: 4.5 mmol/L (ref 3.5–5.1)
Sodium: 141 mmol/L (ref 135–145)
Total Bilirubin: 1.2 mg/dL (ref 0.3–1.2)
Total Protein: 6.6 g/dL (ref 6.5–8.1)

## 2022-01-30 LAB — TOTAL PROTEIN, URINE DIPSTICK: Protein, ur: NEGATIVE mg/dL

## 2022-01-30 MED ORDER — OCTREOTIDE ACETATE 30 MG IM KIT
30.0000 mg | PACK | Freq: Once | INTRAMUSCULAR | Status: AC
  Administered 2022-01-30: 30 mg via INTRAMUSCULAR

## 2022-01-30 NOTE — Progress Notes (Signed)
Patient/RN requested patient be added to RD schedule today.  66 year old male diagnosed with Pancreas neuroendocrine tumor and followed by Dr. [name].  PMH includes COVID 2, hypercholesterolemia, HTN, and DM.  Medications include: Tums, Colace, Sandostatin, and Zofran.  Labs include glucose of 133.  Height: 5'10". Weight: 160 pounds and 3 oz. UBW: 182 pounds per patient BMI: 22.99.  Patient complains of increased fatigue and weakness. He endorses continued weight loss Started drinking 1 carton original ensure about 4 days ago. States he used to lift weights but has been unable to do that recently. He is moving and has been very active and this had made him notice how tired he is. Reports his appetite is ok.  Nutrition Diagnosis: Unintended wt loss related to cancer and associated treatments as evidenced by 12% wt loss from UBW.  Intervention: Educated on strategies to increase calories and protein in small frequent meals and snacks. Reviewed ways to add calories to foods. Recommended change original ensure to Ensure Complete or equivalent and consume 2 daily. Can increase to 3 daily if no improvement in weight. Encouraged activity as tolerated. Provided nutrition fact sheets, coupons and contact information. Questions answered.  Monitoring, Evaluation, Goals: Tolerate increased calories and protein to minimize further wt loss.  No follow up scheduled. Patient will contact RD for needs.

## 2022-01-30 NOTE — Patient Instructions (Signed)
Octreotide injection solution ?What is this medication? ?OCTREOTIDE (ok TREE oh tide) is used to reduce blood levels of growth hormone in patients with a condition called acromegaly. This medicine also reduces flushing and watery diarrhea caused by certain types of cancer. ?This medicine may be used for other purposes; ask your health care provider or pharmacist if you have questions. ?COMMON BRAND NAME(S): Bynfezia, Sandostatin ?What should I tell my care team before I take this medication? ?They need to know if you have any of these conditions: ?diabetes ?gallbladder disease ?kidney disease ?liver disease ?thyroid disease ?an unusual or allergic reaction to octreotide, other medicines, foods, dyes, or preservatives ?pregnant or trying to get pregnant ?breast-feeding ?How should I use this medication? ?This medication is injected under the skin or into a vein. It is usually given by your care team in a hospital or clinic setting. ?If you get this medication at home, you will be taught how to prepare and give it. Use exactly as directed. Take it as directed on the prescription label at the same time every day. Keep taking it unless your care team tells you to stop. ?Allow the injection solution to come to room temperature before use. Do not warm it artificially. ?It is important that you put your used needles and syringes in a special sharps container. Do not put them in a trash can. If you do not have a sharps container, call your pharmacist or care team to get one. ?Talk to your care team about the use of this medication in children. Special care may be needed. ?Overdosage: If you think you have taken too much of this medicine contact a poison control center or emergency room at once. ?NOTE: This medicine is only for you. Do not share this medicine with others. ?What if I miss a dose? ?If you miss a dose, take it as soon as you can. If it is almost time for your next dose, take only that dose. Do not take double  or extra doses. ?What may interact with this medication? ?bromocriptine ?certain medicines for blood pressure, heart disease, irregular heartbeat ?cyclosporine ?diuretics ?medicines for diabetes, including insulin ?quinidine ?This list may not describe all possible interactions. Give your health care provider a list of all the medicines, herbs, non-prescription drugs, or dietary supplements you use. Also tell them if you smoke, drink alcohol, or use illegal drugs. Some items may interact with your medicine. ?What should I watch for while using this medication? ?Visit your care team for regular checks on your progress. Tell your care team if your symptoms do not start to get better or if they get worse. ?To help reduce irritation at the injection site, use a different site for each injection and make sure the solution is at room temperature before use. ?This medication may cause decreases in blood sugar. Signs of low blood sugar include chills, cool, pale skin or cold sweats, drowsiness, extreme hunger, fast heartbeat, headache, nausea, nervousness or anxiety, shakiness, trembling, unsteadiness, tiredness, or weakness. Contact your care team right away if you experience any of these symptoms. ?This medication may increase blood sugar. The risk may be higher in patients who already have diabetes. Ask your care team what you can do to lower your risk of diabetes while taking this medication. ?You should make sure you get enough vitamin B12 while you are taking this medication. Discuss the foods you eat and the vitamins you take with your care team. ?What side effects may I notice from receiving   this medication? ?Side effects that you should report to your doctor or health care professional as soon as possible: ?allergic reactions like skin rash, itching or hives, swelling of the face, lips, or tongue ?fast, slow, or irregular heartbeat ?right upper belly pain ?severe stomach pain ?signs and symptoms of high blood sugar  such as being more thirsty or hungry or having to urinate more than normal. You may also feel very tired or have blurry vision. ?signs and symptoms of low blood sugar such as feeling anxious; confusion; dizziness; increased hunger; unusually weak or tired; increased sweating; shakiness; cold, clammy skin; irritable; headache; blurred vision; fast heartbeat; loss of consciousness ?unusually weak or tired ?Side effects that usually do not require medical attention (report to your doctor or health care professional if they continue or are bothersome): ?diarrhea ?dizziness ?gas ?headache ?nausea, vomiting ?pain, redness, or irritation at site where injected ?upset stomach ?This list may not describe all possible side effects. Call your doctor for medical advice about side effects. You may report side effects to FDA at 1-800-FDA-1088. ?Where should I keep my medication? ?Keep out of the reach of children and pets. ?Store in the refrigerator. Protect from light. Allow to come to room temperature naturally. Do not use artificial heat. If protected from light, the injection may be stored between 20 and 30 degrees C (70 and 86 degrees F) for 14 days. After the initial use, throw away any unused portion of a multiple dose vial after 14 days. Get rid of any unused portions of the ampules after use. ?To get rid of medications that are no longer needed or have expired: ?Take the medication to a medication take-back program. Ask your pharmacy or law enforcement to find a location. ?If you cannot return the medication, ask your pharmacist or care team how to get rid of the medication safely. ?NOTE: This sheet is a summary. It may not cover all possible information. If you have questions about this medicine, talk to your doctor, pharmacist, or health care provider. ?? 2023 Elsevier/Gold Standard (2021-07-13 00:00:00) ? ?

## 2022-01-30 NOTE — Progress Notes (Signed)
[place] OFFICE PROGRESS NOTE   Diagnosis: Pancreas neuroendocrine tumor  INTERVAL HISTORY:   Ronald Thompson returns as scheduled.  He continues cabozantinib.  He reports no return of the headaches with the dose reduction.  He reports malaise and anorexia.  He attributes his symptoms to a recent move from [place] home to a retirement center. No rash or diarrhea.  Objective:  Vital signs in last 24 hours:  Blood pressure (!) 156/92, pulse 87, temperature 98.1 F (36.7 C), temperature source Oral, resp. rate 18, height '5\' 10"'$  (1.778 m), weight 160 lb 3.2 oz (72.7 kg), SpO2 98 %.    HEENT: No thrush or ulcers Resp: Decreased breath sounds at the left lower chest, inspiratory rales at the right posterior base, no respiratory distress Cardio: Regular rate and rhythm GI: Liver is palpable throughout the right abdomen Vascular: No leg edema    Lab Results:  Lab Results  Component Value Date   WBC 4.1 01/30/2022   HGB 12.6 (L) 01/30/2022   HCT 38.8 (L) 01/30/2022   MCV 98.5 01/30/2022   PLT 120 (L) 01/30/2022   NEUTROABS 3.2 01/30/2022    CMP  Lab Results  Component Value Date   NA 140 01/16/2022   K 4.3 01/16/2022   CL 99 01/16/2022   CO2 31 01/16/2022   GLUCOSE 92 01/16/2022   BUN 21 01/16/2022   CREATININE 1.20 01/16/2022   CALCIUM 9.8 01/16/2022   PROT 6.7 01/16/2022   ALBUMIN 4.1 01/16/2022   AST 29 01/16/2022   ALT 20 01/16/2022   ALKPHOS 75 01/16/2022   BILITOT 1.0 01/16/2022   GFRNONAA >60 01/16/2022   GFRAA >60 01/19/2020    Medications: I have reviewed the patient's current medications.   Assessment/Plan: Pancreatic neuroendocrine tumor, WHO grade 2, pancreatic head mass and small peripancreatic/celiac nodes on a CT 02/04/2014 EUS revealed evidence of multiple liver metastases-status post an FNA biopsy of a left liver lesion 02/10/2014 confirming a neuroendocrine tumor   Octreotide scan 04/01/2014 with multiple foci of metastatic  neuroendocrine tumor in the liver Monthly Sandostatin started 03/16/2014 Restaging CT 07/04/2014 with resolution of a previously noted pancreas head lesion and probable progression of liver metastases Restaging CT 10/31/2014-stable liver lesions, no evidence of a pancreas mass, stable. Restaging CT 02/28/2015-stable hepatic metastases, stable pancreas lesion unchanged Restaging CT 08/30/2015-stable pancreas mass, slight enlargement of hepatic metastases Monthly Sandostatin continued Restaging CTs 01/17/2016 revealed enlargement of liver lesions, no new lesions, enlargement of the pancreas head mass Gallium DOTATATE scan 10/01/2016 confirmed uptake in the liver metastases, pancreas primary, peripancreatic adenopathy, and left iliac bone Cycle 1 Lutathera 03/05/2017 Cycle 2 Lutathera 04/30/2017 Cycle 3 Lutathera 07/02/2017 Cycle 4 Lutathera 08/27/2017 Netspot scan 09/29/2017-decreased radiotracer activity within the pancreas head, peripancreatic lymph node, and solitary skeletal lesion.  Liver lesions have increased in size with a decrease in radiotracer activity Monthly Sandostatin continued Netspot scan 05/20/2018-stable to decreased size of liver lesions, most have decreased SUV, similar uptake in the pancreas lesion, previously noted peripancreatic lymph node has resolved him a mild decrease in uptake associated with a left iliac metastasis, no evidence of disease progression Monthly Sandostatin continued Netspot scan 01/29/2019- overall stable to improved, 1 lesion left hepatic lobe has increased tracer activity-unchanged in size, majority of hepatic lesions have reduced activity compared to the pretreatment scan, decreased activity left iliac bone lesion, stable activity in the head of the pancreas lesion, no new lesions Monthly Sandostatin continued Netspot on September 24, 2019-increase in size of 3  liver lesions with associated stable radiotracer activity, no new lesions, stable small left  iliac lesion Cycle 1 salvage Lutathera 10/27/2019 Cycle 2 salvage Lutathera 12/22/2019 Netspot on 02/15/2020-decrease in radiotracer activity in the liver metastases with a decrease in size of several lesions, no new lesions, no progressive disease in the bowel or mesentery, decreased activity in the left iliac bone lesion Monthly Sandostatin continued  Netspot 10/13/2020-decrease in radiotracer activity in the left and right liver, decreased size of liver lesions, one lesion in the superior right liver has increased in size and has no radiotracer activity, no evidence of metastatic disease outside the liver PET 11/15/2020- moderate hypermetabolic activity associated with several enlarging liver lesions that had limited activity on the dotatate PET, a lesion in the left lateral hepatic lobe with mild persistent dotatate activity has mild peripheral FDG activity and is decreased in size from a dotatate PET 1 year ago.  No hypermetabolic activity in the pancreas or bowel.  No hypermetabolic activity in the previous dotatate avid left iliac lesion Monthly Sandostatin continued Cycle 1 capecitabine/temozolomide 01/22/2021 (capecitabine twice daily days 1 through 14 of each 28-day cycle/temozolomide days 10 through 14) Cycle 2 capecitabine/temozolomide 02/19/2021 Cycle 3 capecitabine/temozolomide 03/19/2021 Cycle 4 capecitabine/temozolomide 04/17/2021 PET 05/11/2021-slight increase in size of several FDG avid liver lesions, no new hepatic lesions, no evidence of metastatic disease to the chest or bones Cycle 5 capecitabine/temozolomide 05/16/2021 Cycle 6 capecitabine/temozolomide 06/25/2021, capecitabine dose reduced Cycle 7 capecitabine/temozolomide 07/23/2021 Cycle 8 capecitabine/temozolomide 08/20/2021 PET 09/13/2021-mildly progressive hepatic metastases Everolimus 10/11/2021-discontinued after 2 weeks secondary to cost Cabozantinib 12/13/2021 Cabozantinib discontinued 12/24/2021 Cabozantinib resumed at a reduced  dose of 20 mg daily beginning 01/12/2022    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 chronic exertional dyspnea following treatment for the seminoma   5. aortic stenosis on an echocardiogram December 2011, severe aortic stenosis on echocardiogram 12/06/2015, status post TAVR on 04/16/2016 6. lumbar disc surgery 2014   7. acute abdominal pain 02/04/2014-resolved   8.  Diabetes 9.  COVID-19 infection July 2022    Disposition: Ronald Thompson is tolerating cabozantinib well at the current dose.  He will continue cabozantinib at a dose of 20 mg daily.  I am concerned the weight loss and malaise may be related to disease progression.  We will schedule him to meet with the Cancer center pharmacist.  He will return for an office and lab visit in 3 weeks.  He will call in the interim for new symptoms.  He will continue monthly Sandostatin.  Betsy Coder, MD  01/30/2022  12:11 PM

## 2022-02-01 LAB — CHROMOGRANIN A: Chromogranin A (ng/mL): 18160 ng/mL — ABNORMAL HIGH (ref 0.0–101.8)

## 2022-02-07 ENCOUNTER — Telehealth: Payer: Self-pay | Admitting: Nutrition

## 2022-02-07 NOTE — Telephone Encounter (Signed)
Patient contacted this RD asking about a multivitamin.  Patient is already taking 5000 IUs of vitamin D.  He has not had his vitamin D level drawn recently.  I recommended patient choose a multivitamin with no more than 100% RDI.  Encouraged patient to ask primary physician to draw vitamin D levels and adjust over-the-counter vitamin D as needed.

## 2022-02-19 ENCOUNTER — Inpatient Hospital Stay (HOSPITAL_BASED_OUTPATIENT_CLINIC_OR_DEPARTMENT_OTHER): Payer: Medicare Other | Admitting: Nurse Practitioner

## 2022-02-19 ENCOUNTER — Encounter: Payer: Self-pay | Admitting: Nurse Practitioner

## 2022-02-19 ENCOUNTER — Inpatient Hospital Stay: Payer: Medicare Other | Attending: Oncology

## 2022-02-19 VITALS — BP 149/89 | HR 74 | Temp 98.2°F | Resp 18 | Ht 70.0 in | Wt 159.8 lb

## 2022-02-19 DIAGNOSIS — D3A8 Other benign neuroendocrine tumors: Secondary | ICD-10-CM

## 2022-02-19 DIAGNOSIS — C7B8 Other secondary neuroendocrine tumors: Secondary | ICD-10-CM | POA: Insufficient documentation

## 2022-02-19 DIAGNOSIS — C7A8 Other malignant neuroendocrine tumors: Secondary | ICD-10-CM | POA: Diagnosis not present

## 2022-02-19 LAB — CBC WITH DIFFERENTIAL (CANCER CENTER ONLY)
Abs Immature Granulocytes: 0 10*3/uL (ref 0.00–0.07)
Basophils Absolute: 0 10*3/uL (ref 0.0–0.1)
Basophils Relative: 0 %
Eosinophils Absolute: 0.1 10*3/uL (ref 0.0–0.5)
Eosinophils Relative: 2 %
HCT: 38 % — ABNORMAL LOW (ref 39.0–52.0)
Hemoglobin: 12.9 g/dL — ABNORMAL LOW (ref 13.0–17.0)
Immature Granulocytes: 0 %
Lymphocytes Relative: 9 %
Lymphs Abs: 0.5 10*3/uL — ABNORMAL LOW (ref 0.7–4.0)
MCH: 33.5 pg (ref 26.0–34.0)
MCHC: 33.9 g/dL (ref 30.0–36.0)
MCV: 98.7 fL (ref 80.0–100.0)
Monocytes Absolute: 0.6 10*3/uL (ref 0.1–1.0)
Monocytes Relative: 12 %
Neutro Abs: 3.8 10*3/uL (ref 1.7–7.7)
Neutrophils Relative %: 77 %
Platelet Count: 110 10*3/uL — ABNORMAL LOW (ref 150–400)
RBC: 3.85 MIL/uL — ABNORMAL LOW (ref 4.22–5.81)
RDW: 15.4 % (ref 11.5–15.5)
WBC Count: 4.9 10*3/uL (ref 4.0–10.5)
nRBC: 0 % (ref 0.0–0.2)

## 2022-02-19 LAB — CMP (CANCER CENTER ONLY)
ALT: 21 U/L (ref 0–44)
AST: 30 U/L (ref 15–41)
Albumin: 4.3 g/dL (ref 3.5–5.0)
Alkaline Phosphatase: 70 U/L (ref 38–126)
Anion gap: 9 (ref 5–15)
BUN: 23 mg/dL (ref 8–23)
CO2: 30 mmol/L (ref 22–32)
Calcium: 9.7 mg/dL (ref 8.9–10.3)
Chloride: 100 mmol/L (ref 98–111)
Creatinine: 1.15 mg/dL (ref 0.61–1.24)
GFR, Estimated: >60 mL/min (ref >60)
Glucose, Bld: 98 mg/dL (ref 70–99)
Potassium: 4.4 mmol/L (ref 3.5–5.1)
Sodium: 139 mmol/L (ref 135–145)
Total Bilirubin: 1.2 mg/dL (ref 0.3–1.2)
Total Protein: 6.4 g/dL — ABNORMAL LOW (ref 6.5–8.1)

## 2022-02-19 LAB — TOTAL PROTEIN, URINE DIPSTICK: Protein, ur: NEGATIVE mg/dL

## 2022-02-19 NOTE — Progress Notes (Signed)
[place] OFFICE PROGRESS NOTE   Diagnosis: Pancreas neuroendocrine tumor  INTERVAL HISTORY:   Ronald Thompson returns as scheduled.  He continues cabozantinib.  He denies nausea/vomiting.  No mouth sores.  Periodic loose stools controlled with Imodium.  No significant abdominal pain.  No rash.  Appetite described as "okay".  He is drinking nutritional supplements.  He feels weak overall, notes longer recovery time after exerting himself.  Objective:  Vital signs in last 24 hours:  Blood pressure (!) 149/89, pulse 74, temperature 98.2 F (36.8 C), resp. rate 18, height '5\' 10"'$  (1.778 m), weight 159 lb 12.8 oz (72.5 kg), SpO2 98 %.    HEENT: No thrush or ulcers. Resp: Decreased breath sounds left lower chest.  No respiratory distress. Cardio: Regular rate and rhythm. GI: Liver palpable throughout the right abdomen. Vascular: No leg edema. Skin: No rash   Lab Results:  Lab Results  Component Value Date   WBC 4.9 02/19/2022   HGB 12.9 (L) 02/19/2022   HCT 38.0 (L) 02/19/2022   MCV 98.7 02/19/2022   PLT 110 (L) 02/19/2022   NEUTROABS 3.8 02/19/2022    Imaging:  No results found.  Medications: I have reviewed the patient's current medications.  Assessment/Plan: Pancreatic neuroendocrine tumor, WHO grade 2, pancreatic head mass and small peripancreatic/celiac nodes on a CT 02/04/2014 EUS revealed evidence of multiple liver metastases-status post an FNA biopsy of a left liver lesion 02/10/2014 confirming a neuroendocrine tumor   Octreotide scan 04/01/2014 with multiple foci of metastatic neuroendocrine tumor in the liver Monthly Sandostatin started 03/16/2014 Restaging CT 07/04/2014 with resolution of a previously noted pancreas head lesion and probable progression of liver metastases Restaging CT 10/31/2014-stable liver lesions, no evidence of a pancreas mass, stable. Restaging CT 02/28/2015-stable hepatic metastases, stable pancreas lesion unchanged Restaging  CT 08/30/2015-stable pancreas mass, slight enlargement of hepatic metastases Monthly Sandostatin continued Restaging CTs 01/17/2016 revealed enlargement of liver lesions, no new lesions, enlargement of the pancreas head mass Gallium DOTATATE scan 10/01/2016 confirmed uptake in the liver metastases, pancreas primary, peripancreatic adenopathy, and left iliac bone Cycle 1 Lutathera 03/05/2017 Cycle 2 Lutathera 04/30/2017 Cycle 3 Lutathera 07/02/2017 Cycle 4 Lutathera 08/27/2017 Netspot scan 09/29/2017-decreased radiotracer activity within the pancreas head, peripancreatic lymph node, and solitary skeletal lesion.  Liver lesions have increased in size with a decrease in radiotracer activity Monthly Sandostatin continued Netspot scan 05/20/2018-stable to decreased size of liver lesions, most have decreased SUV, similar uptake in the pancreas lesion, previously noted peripancreatic lymph node has resolved him a mild decrease in uptake associated with a left iliac metastasis, no evidence of disease progression Monthly Sandostatin continued Netspot scan 01/29/2019- overall stable to improved, 1 lesion left hepatic lobe has increased tracer activity-unchanged in size, majority of hepatic lesions have reduced activity compared to the pretreatment scan, decreased activity left iliac bone lesion, stable activity in the head of the pancreas lesion, no new lesions Monthly Sandostatin continued Netspot on September 24, 2019-increase in size of 3 liver lesions with associated stable radiotracer activity, no new lesions, stable small left iliac lesion Cycle 1 salvage Lutathera 10/27/2019 Cycle 2 salvage Lutathera 12/22/2019 Netspot on 02/15/2020-decrease in radiotracer activity in the liver metastases with a decrease in size of several lesions, no new lesions, no progressive disease in the bowel or mesentery, decreased activity in the left iliac bone lesion Monthly Sandostatin continued  Netspot 10/13/2020-decrease in  radiotracer activity in the left and right liver, decreased size of liver lesions, one lesion in the superior  right liver has increased in size and has no radiotracer activity, no evidence of metastatic disease outside the liver PET 11/15/2020- moderate hypermetabolic activity associated with several enlarging liver lesions that had limited activity on the dotatate PET, a lesion in the left lateral hepatic lobe with mild persistent dotatate activity has mild peripheral FDG activity and is decreased in size from a dotatate PET 1 year ago.  No hypermetabolic activity in the pancreas or bowel.  No hypermetabolic activity in the previous dotatate avid left iliac lesion Monthly Sandostatin continued Cycle 1 capecitabine/temozolomide 01/22/2021 (capecitabine twice daily days 1 through 14 of each 28-day cycle/temozolomide days 10 through 14) Cycle 2 capecitabine/temozolomide 02/19/2021 Cycle 3 capecitabine/temozolomide 03/19/2021 Cycle 4 capecitabine/temozolomide 04/17/2021 PET 05/11/2021-slight increase in size of several FDG avid liver lesions, no new hepatic lesions, no evidence of metastatic disease to the chest or bones Cycle 5 capecitabine/temozolomide 05/16/2021 Cycle 6 capecitabine/temozolomide 06/25/2021, capecitabine dose reduced Cycle 7 capecitabine/temozolomide 07/23/2021 Cycle 8 capecitabine/temozolomide 08/20/2021 PET 09/13/2021-mildly progressive hepatic metastases Everolimus 10/11/2021-discontinued after 2 weeks secondary to cost Cabozantinib 12/13/2021 Cabozantinib discontinued 12/24/2021 Cabozantinib resumed at a reduced dose of 20 mg daily beginning 01/12/2022    Chest Seminoma in 1990 treated with BEP and chest radiation at [facility] 3. 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 chronic exertional dyspnea following treatment for the seminoma   5.  aortic stenosis on an echocardiogram December 2011, severe aortic stenosis on echocardiogram 12/06/2015, status post TAVR on 04/16/2016 6. lumbar disc surgery 2014   7. acute abdominal pain 02/04/2014-resolved   8.  Diabetes 9.  COVID-19 infection July 2022    Disposition: Mr. Broadfoot appears stable.  He is tolerating cabozantinib well at the current dose.  Plan to continue the same.  Chromogranin A level was improved last month.  He continues monthly Sandostatin, next injection 02/28/2022.  He will return for lab and follow-up in 3 weeks.  We are available to see him sooner if needed.      Ned Card ANP/GNP-BC   02/19/2022  8:50 AM

## 2022-02-21 ENCOUNTER — Telehealth: Payer: Self-pay

## 2022-02-21 NOTE — Telephone Encounter (Signed)
Returned call from last night.Pt called access line for scheduling.

## 2022-02-28 ENCOUNTER — Inpatient Hospital Stay: Payer: Medicare Other

## 2022-03-04 ENCOUNTER — Inpatient Hospital Stay: Payer: Medicare Other

## 2022-03-04 VITALS — BP 139/86 | HR 82 | Temp 98.2°F | Resp 18 | Ht 70.0 in | Wt 160.2 lb

## 2022-03-04 DIAGNOSIS — C7B8 Other secondary neuroendocrine tumors: Secondary | ICD-10-CM | POA: Diagnosis not present

## 2022-03-04 DIAGNOSIS — C7A8 Other malignant neuroendocrine tumors: Secondary | ICD-10-CM | POA: Diagnosis not present

## 2022-03-04 DIAGNOSIS — D3A8 Other benign neuroendocrine tumors: Secondary | ICD-10-CM

## 2022-03-04 MED ORDER — OCTREOTIDE ACETATE 30 MG IM KIT
30.0000 mg | PACK | Freq: Once | INTRAMUSCULAR | Status: AC
  Administered 2022-03-04: 30 mg via INTRAMUSCULAR
  Filled 2022-03-04: qty 1

## 2022-03-04 NOTE — Patient Instructions (Signed)
Octreotide injection solution ?What is this medication? ?OCTREOTIDE (ok TREE oh tide) is used to reduce blood levels of growth hormone in patients with a condition called acromegaly. This medicine also reduces flushing and watery diarrhea caused by certain types of cancer. ?This medicine may be used for other purposes; ask your health care provider or pharmacist if you have questions. ?COMMON BRAND NAME(S): Bynfezia, Sandostatin ?What should I tell my care team before I take this medication? ?They need to know if you have any of these conditions: ?diabetes ?gallbladder disease ?kidney disease ?liver disease ?thyroid disease ?an unusual or allergic reaction to octreotide, other medicines, foods, dyes, or preservatives ?pregnant or trying to get pregnant ?breast-feeding ?How should I use this medication? ?This medication is injected under the skin or into a vein. It is usually given by your care team in a hospital or clinic setting. ?If you get this medication at home, you will be taught how to prepare and give it. Use exactly as directed. Take it as directed on the prescription label at the same time every day. Keep taking it unless your care team tells you to stop. ?Allow the injection solution to come to room temperature before use. Do not warm it artificially. ?It is important that you put your used needles and syringes in a special sharps container. Do not put them in a trash can. If you do not have a sharps container, call your pharmacist or care team to get one. ?Talk to your care team about the use of this medication in children. Special care may be needed. ?Overdosage: If you think you have taken too much of this medicine contact a poison control center or emergency room at once. ?NOTE: This medicine is only for you. Do not share this medicine with others. ?What if I miss a dose? ?If you miss a dose, take it as soon as you can. If it is almost time for your next dose, take only that dose. Do not take double  or extra doses. ?What may interact with this medication? ?bromocriptine ?certain medicines for blood pressure, heart disease, irregular heartbeat ?cyclosporine ?diuretics ?medicines for diabetes, including insulin ?quinidine ?This list may not describe all possible interactions. Give your health care provider a list of all the medicines, herbs, non-prescription drugs, or dietary supplements you use. Also tell them if you smoke, drink alcohol, or use illegal drugs. Some items may interact with your medicine. ?What should I watch for while using this medication? ?Visit your care team for regular checks on your progress. Tell your care team if your symptoms do not start to get better or if they get worse. ?To help reduce irritation at the injection site, use a different site for each injection and make sure the solution is at room temperature before use. ?This medication may cause decreases in blood sugar. Signs of low blood sugar include chills, cool, pale skin or cold sweats, drowsiness, extreme hunger, fast heartbeat, headache, nausea, nervousness or anxiety, shakiness, trembling, unsteadiness, tiredness, or weakness. Contact your care team right away if you experience any of these symptoms. ?This medication may increase blood sugar. The risk may be higher in patients who already have diabetes. Ask your care team what you can do to lower your risk of diabetes while taking this medication. ?You should make sure you get enough vitamin B12 while you are taking this medication. Discuss the foods you eat and the vitamins you take with your care team. ?What side effects may I notice from receiving   this medication? ?Side effects that you should report to your doctor or health care professional as soon as possible: ?allergic reactions like skin rash, itching or hives, swelling of the face, lips, or tongue ?fast, slow, or irregular heartbeat ?right upper belly pain ?severe stomach pain ?signs and symptoms of high blood sugar  such as being more thirsty or hungry or having to urinate more than normal. You may also feel very tired or have blurry vision. ?signs and symptoms of low blood sugar such as feeling anxious; confusion; dizziness; increased hunger; unusually weak or tired; increased sweating; shakiness; cold, clammy skin; irritable; headache; blurred vision; fast heartbeat; loss of consciousness ?unusually weak or tired ?Side effects that usually do not require medical attention (report to your doctor or health care professional if they continue or are bothersome): ?diarrhea ?dizziness ?gas ?headache ?nausea, vomiting ?pain, redness, or irritation at site where injected ?upset stomach ?This list may not describe all possible side effects. Call your doctor for medical advice about side effects. You may report side effects to FDA at 1-800-FDA-1088. ?Where should I keep my medication? ?Keep out of the reach of children and pets. ?Store in the refrigerator. Protect from light. Allow to come to room temperature naturally. Do not use artificial heat. If protected from light, the injection may be stored between 20 and 30 degrees C (70 and 86 degrees F) for 14 days. After the initial use, throw away any unused portion of a multiple dose vial after 14 days. Get rid of any unused portions of the ampules after use. ?To get rid of medications that are no longer needed or have expired: ?Take the medication to a medication take-back program. Ask your pharmacy or law enforcement to find a location. ?If you cannot return the medication, ask your pharmacist or care team how to get rid of the medication safely. ?NOTE: This sheet is a summary. It may not cover all possible information. If you have questions about this medicine, talk to your doctor, pharmacist, or health care provider. ?? 2023 Elsevier/Gold Standard (2021-07-13 00:00:00) ? ?

## 2022-03-06 ENCOUNTER — Other Ambulatory Visit: Payer: Self-pay | Admitting: *Deleted

## 2022-03-06 DIAGNOSIS — D3A8 Other benign neuroendocrine tumors: Secondary | ICD-10-CM

## 2022-03-06 MED ORDER — CABOZANTINIB S-MALATE 20 MG PO TABS: 20.0000 mg | ORAL_TABLET | Freq: Every day | ORAL | 1 refills | Status: DC

## 2022-03-06 NOTE — Telephone Encounter (Signed)
Received faxed refill request from [employer] for cabometyx.

## 2022-03-12 ENCOUNTER — Inpatient Hospital Stay: Payer: Medicare Other | Attending: Oncology

## 2022-03-12 ENCOUNTER — Inpatient Hospital Stay (HOSPITAL_BASED_OUTPATIENT_CLINIC_OR_DEPARTMENT_OTHER): Payer: Medicare Other | Admitting: Oncology

## 2022-03-12 VITALS — BP 151/87 | HR 90 | Temp 98.2°F | Resp 18 | Ht 70.0 in | Wt 160.4 lb

## 2022-03-12 DIAGNOSIS — C7B8 Other secondary neuroendocrine tumors: Secondary | ICD-10-CM | POA: Insufficient documentation

## 2022-03-12 DIAGNOSIS — D3A8 Other benign neuroendocrine tumors: Secondary | ICD-10-CM | POA: Diagnosis not present

## 2022-03-12 DIAGNOSIS — C7A8 Other malignant neuroendocrine tumors: Secondary | ICD-10-CM | POA: Insufficient documentation

## 2022-03-12 DIAGNOSIS — R197 Diarrhea, unspecified: Secondary | ICD-10-CM | POA: Diagnosis not present

## 2022-03-12 DIAGNOSIS — R110 Nausea: Secondary | ICD-10-CM | POA: Insufficient documentation

## 2022-03-12 LAB — CMP (CANCER CENTER ONLY)
ALT: 22 U/L (ref 0–44)
AST: 32 U/L (ref 15–41)
Albumin: 4.2 g/dL (ref 3.5–5.0)
Alkaline Phosphatase: 93 U/L (ref 38–126)
Anion gap: 10 (ref 5–15)
BUN: 22 mg/dL (ref 8–23)
CO2: 30 mmol/L (ref 22–32)
Calcium: 9.6 mg/dL (ref 8.9–10.3)
Chloride: 100 mmol/L (ref 98–111)
Creatinine: 1.09 mg/dL (ref 0.61–1.24)
GFR, Estimated: >60 mL/min (ref >60)
Glucose, Bld: 98 mg/dL (ref 70–99)
Potassium: 4.2 mmol/L (ref 3.5–5.1)
Sodium: 140 mmol/L (ref 135–145)
Total Bilirubin: 1 mg/dL (ref 0.3–1.2)
Total Protein: 6.9 g/dL (ref 6.5–8.1)

## 2022-03-12 LAB — CBC WITH DIFFERENTIAL (CANCER CENTER ONLY)
Abs Immature Granulocytes: 0.01 10*3/uL (ref 0.00–0.07)
Basophils Absolute: 0 10*3/uL (ref 0.0–0.1)
Basophils Relative: 0 %
Eosinophils Absolute: 0.2 10*3/uL (ref 0.0–0.5)
Eosinophils Relative: 3 %
HCT: 38.3 % — ABNORMAL LOW (ref 39.0–52.0)
Hemoglobin: 12.8 g/dL — ABNORMAL LOW (ref 13.0–17.0)
Immature Granulocytes: 0 %
Lymphocytes Relative: 9 %
Lymphs Abs: 0.5 10*3/uL — ABNORMAL LOW (ref 0.7–4.0)
MCH: 33.1 pg (ref 26.0–34.0)
MCHC: 33.4 g/dL (ref 30.0–36.0)
MCV: 99 fL (ref 80.0–100.0)
Monocytes Absolute: 0.7 10*3/uL (ref 0.1–1.0)
Monocytes Relative: 13 %
Neutro Abs: 4 10*3/uL (ref 1.7–7.7)
Neutrophils Relative %: 75 %
Platelet Count: 105 10*3/uL — ABNORMAL LOW (ref 150–400)
RBC: 3.87 MIL/uL — ABNORMAL LOW (ref 4.22–5.81)
RDW: 15.1 % (ref 11.5–15.5)
WBC Count: 5.3 10*3/uL (ref 4.0–10.5)
nRBC: 0 % (ref 0.0–0.2)

## 2022-03-12 LAB — TOTAL PROTEIN, URINE DIPSTICK

## 2022-03-12 NOTE — Progress Notes (Signed)
[place] OFFICE PROGRESS NOTE   Diagnosis: Pancreas neuroendocrine tumor  INTERVAL HISTORY:   Ronald Thompson returns as scheduled.  He continues cabozantinib.  He has intermittent nausea.  No rash.  He has occasional diarrhea, controlled with Imodium.  No new complaint.  Objective:  Vital signs in last 24 hours:  Blood pressure (!) 151/87, pulse 90, temperature 98.2 F (36.8 C), temperature source Oral, resp. rate 18, height '5\' 10"'$  (1.778 m), weight 160 lb 6.4 oz (72.8 kg), SpO2 100 %.    HEENT: No thrush or ulcers Resp: Lungs clear bilaterally Cardio: Regular rate and rhythm GI: The liver is palpable in the right abdomen extending below the umbilicus Vascular: No leg edema   Lab Results:  Lab Results  Component Value Date   WBC 5.3 03/12/2022   HGB 12.8 (L) 03/12/2022   HCT 38.3 (L) 03/12/2022   MCV 99.0 03/12/2022   PLT 105 (L) 03/12/2022   NEUTROABS 4.0 03/12/2022    CMP  Lab Results  Component Value Date   NA 140 03/12/2022   K 4.2 03/12/2022   CL 100 03/12/2022   CO2 30 03/12/2022   GLUCOSE 98 03/12/2022   BUN 22 03/12/2022   CREATININE 1.09 03/12/2022   CALCIUM 9.6 03/12/2022   PROT 6.9 03/12/2022   ALBUMIN 4.2 03/12/2022   AST 32 03/12/2022   ALT 22 03/12/2022   ALKPHOS 93 03/12/2022   BILITOT 1.0 03/12/2022   GFRNONAA >60 03/12/2022   GFRAA >60 01/19/2020     Medications: I have reviewed the patient's current medications.   Assessment/Plan: Pancreatic neuroendocrine tumor, WHO grade 2, pancreatic head mass and small peripancreatic/celiac nodes on a CT 02/04/2014 EUS revealed evidence of multiple liver metastases-status post an FNA biopsy of a left liver lesion 02/10/2014 confirming a neuroendocrine tumor   Octreotide scan 04/01/2014 with multiple foci of metastatic neuroendocrine tumor in the liver Monthly Sandostatin started 03/16/2014 Restaging CT 07/04/2014 with resolution of a previously noted pancreas head lesion and  probable progression of liver metastases Restaging CT 10/31/2014-stable liver lesions, no evidence of a pancreas mass, stable. Restaging CT 02/28/2015-stable hepatic metastases, stable pancreas lesion unchanged Restaging CT 08/30/2015-stable pancreas mass, slight enlargement of hepatic metastases Monthly Sandostatin continued Restaging CTs 01/17/2016 revealed enlargement of liver lesions, no new lesions, enlargement of the pancreas head mass Gallium DOTATATE scan 10/01/2016 confirmed uptake in the liver metastases, pancreas primary, peripancreatic adenopathy, and left iliac bone Cycle 1 Lutathera 03/05/2017 Cycle 2 Lutathera 04/30/2017 Cycle 3 Lutathera 07/02/2017 Cycle 4 Lutathera 08/27/2017 Netspot scan 09/29/2017-decreased radiotracer activity within the pancreas head, peripancreatic lymph node, and solitary skeletal lesion.  Liver lesions have increased in size with a decrease in radiotracer activity Monthly Sandostatin continued Netspot scan 05/20/2018-stable to decreased size of liver lesions, most have decreased SUV, similar uptake in the pancreas lesion, previously noted peripancreatic lymph node has resolved him a mild decrease in uptake associated with a left iliac metastasis, no evidence of disease progression Monthly Sandostatin continued Netspot scan 01/29/2019- overall stable to improved, 1 lesion left hepatic lobe has increased tracer activity-unchanged in size, majority of hepatic lesions have reduced activity compared to the pretreatment scan, decreased activity left iliac bone lesion, stable activity in the head of the pancreas lesion, no new lesions Monthly Sandostatin continued Netspot on September 24, 2019-increase in size of 3 liver lesions with associated stable radiotracer activity, no new lesions, stable small left iliac lesion Cycle 1 salvage Lutathera 10/27/2019 Cycle 2 salvage Lutathera 12/22/2019 Netspot on 02/15/2020-decrease  in radiotracer activity in the liver metastases  with a decrease in size of several lesions, no new lesions, no progressive disease in the bowel or mesentery, decreased activity in the left iliac bone lesion Monthly Sandostatin continued  Netspot 10/13/2020-decrease in radiotracer activity in the left and right liver, decreased size of liver lesions, one lesion in the superior right liver has increased in size and has no radiotracer activity, no evidence of metastatic disease outside the liver PET 11/15/2020- moderate hypermetabolic activity associated with several enlarging liver lesions that had limited activity on the dotatate PET, a lesion in the left lateral hepatic lobe with mild persistent dotatate activity has mild peripheral FDG activity and is decreased in size from a dotatate PET 1 year ago.  No hypermetabolic activity in the pancreas or bowel.  No hypermetabolic activity in the previous dotatate avid left iliac lesion Monthly Sandostatin continued Cycle 1 capecitabine/temozolomide 01/22/2021 (capecitabine twice daily days 1 through 14 of each 28-day cycle/temozolomide days 10 through 14) Cycle 2 capecitabine/temozolomide 02/19/2021 Cycle 3 capecitabine/temozolomide 03/19/2021 Cycle 4 capecitabine/temozolomide 04/17/2021 PET 05/11/2021-slight increase in size of several FDG avid liver lesions, no new hepatic lesions, no evidence of metastatic disease to the chest or bones Cycle 5 capecitabine/temozolomide 05/16/2021 Cycle 6 capecitabine/temozolomide 06/25/2021, capecitabine dose reduced Cycle 7 capecitabine/temozolomide 07/23/2021 Cycle 8 capecitabine/temozolomide 08/20/2021 PET 09/13/2021-mildly progressive hepatic metastases Everolimus 10/11/2021-discontinued after 2 weeks secondary to cost Cabozantinib 12/13/2021 Cabozantinib discontinued 12/24/2021 Cabozantinib resumed at a reduced dose of 20 mg daily beginning 01/12/2022    Chest Seminoma in 1990 treated with BEP and chest radiation at [facility] 3. 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 chronic exertional dyspnea following treatment for the seminoma   5. aortic stenosis on an echocardiogram December 2011, severe aortic stenosis on echocardiogram 12/06/2015, status post TAVR on 04/16/2016 6. lumbar disc surgery 2014   7. acute abdominal pain 02/04/2014-resolved   8.  Diabetes 9.  COVID-19 infection July 2022     Disposition: Mr. Ngo appears unchanged.  We will follow-up on the chromogranin a level from today.  He is tolerating the cabozantinib well.  He will return for an office visit on 03/25/2022.  He will be scheduled for a restaging PET on 04/03/2022.  He continues monthly Sandostatin.  Betsy Coder, MD  03/12/2022  9:17 AM

## 2022-03-13 LAB — CHROMOGRANIN A: Chromogranin A (ng/mL): 29950 ng/mL — ABNORMAL HIGH (ref 0.0–101.8)

## 2022-03-19 ENCOUNTER — Telehealth: Payer: Self-pay | Admitting: *Deleted

## 2022-03-19 NOTE — Telephone Encounter (Signed)
Ronald Thompson notified of PET scan on 8/24 at 0630/0700 at [facility]. NPO except water only after midnight and no candy or gum

## 2022-03-28 ENCOUNTER — Inpatient Hospital Stay: Payer: Medicare Other

## 2022-03-28 ENCOUNTER — Encounter (HOSPITAL_COMMUNITY)
Admission: RE | Admit: 2022-03-28 | Discharge: 2022-03-28 | Disposition: A | Discharge status: Home / Self Care | Payer: Medicare Other | Source: Ambulatory Visit | Attending: Oncology | Admitting: Oncology

## 2022-03-28 VITALS — BP 132/77 | HR 76 | Temp 98.2°F | Resp 18

## 2022-03-28 DIAGNOSIS — C787 Secondary malignant neoplasm of liver and intrahepatic bile duct: Secondary | ICD-10-CM | POA: Diagnosis not present

## 2022-03-28 DIAGNOSIS — D3A8 Other benign neuroendocrine tumors: Secondary | ICD-10-CM | POA: Insufficient documentation

## 2022-03-28 DIAGNOSIS — C254 Malignant neoplasm of endocrine pancreas: Secondary | ICD-10-CM | POA: Diagnosis not present

## 2022-03-28 DIAGNOSIS — C7A8 Other malignant neuroendocrine tumors: Secondary | ICD-10-CM | POA: Diagnosis not present

## 2022-03-28 LAB — GLUCOSE, CAPILLARY: Glucose-Capillary: 109 mg/dL — ABNORMAL HIGH (ref 70–99)

## 2022-03-28 MED ORDER — FLUDEOXYGLUCOSE F - 18 (FDG) INJECTION: 8.0000 | Freq: Once | INTRAVENOUS | Status: DC | PRN

## 2022-03-28 MED ORDER — OCTREOTIDE ACETATE 30 MG IM KIT
30.0000 mg | PACK | Freq: Once | INTRAMUSCULAR | Status: AC
  Administered 2022-03-28: 30 mg via INTRAMUSCULAR
  Filled 2022-03-28: qty 1

## 2022-03-28 NOTE — Patient Instructions (Signed)
Octreotide Injection Solution What is this medication? OCTREOTIDE (ok TREE oh tide) treats high levels of growth hormone (acromegaly). It works by reducing the amount of growth hormone your body makes. This reduces symptoms and the risk of health problems caused by too much growth hormone, such as diabetes and heart disease. It may also be used to treat diarrhea caused by neuroendocrine tumors. It works by slowing down the release of serotonin from the tumor cells. This reduces the number of bowel movements you have. This medicine may be used for other purposes; ask your health care provider or pharmacist if you have questions. COMMON BRAND NAME(S): Bynfezia, Sandostatin What should I tell my care team before I take this medication? They need to know if you have any of these conditions: Diabetes Gallbladder disease Kidney disease Liver disease Thyroid disease An unusual or allergic reaction to octreotide, other medications, foods, dyes, or preservatives Pregnant or trying to get pregnant Breast-feeding How should I use this medication? This medication is injected under the skin or into a vein. It is usually given by your care team in a hospital or clinic setting. If you get this medication at home, you will be taught how to prepare and give it. Use exactly as directed. Take it as directed on the prescription label at the same time every day. Keep taking it unless your care team tells you to stop. Allow the injection solution to come to room temperature before use. Do not warm it artificially. It is important that you put your used needles and syringes in a special sharps container. Do not put them in a trash can. If you do not have a sharps container, call your pharmacist or care team to get one. Talk to your care team about the use of this medication in children. Special care may be needed. Overdosage: If you think you have taken too much of this medicine contact a poison control center or  emergency room at once. NOTE: This medicine is only for you. Do not share this medicine with others. What if I miss a dose? If you miss a dose, take it as soon as you can. If it is almost time for your next dose, take only that dose. Do not take double or extra doses. What may interact with this medication? Bromocriptine Certain medications for blood pressure, heart disease, irregular heartbeat Cyclosporine Diuretics Medications for diabetes, including insulin Quinidine This list may not describe all possible interactions. Give your health care provider a list of all the medicines, herbs, non-prescription drugs, or dietary supplements you use. Also tell them if you smoke, drink alcohol, or use illegal drugs. Some items may interact with your medicine. What should I watch for while using this medication? Visit your care team for regular checks on your progress. Tell your care team if your symptoms do not start to get better or if they get worse. To help reduce irritation at the injection site, use a different site for each injection and make sure the solution is at room temperature before use. This medication may cause decreases in blood sugar. Signs of low blood sugar include chills, cool, pale skin or cold sweats, drowsiness, extreme hunger, fast heartbeat, headache, nausea, nervousness or anxiety, shakiness, trembling, unsteadiness, tiredness, or weakness. Contact your care team right away if you experience any of these symptoms. This medication may increase blood sugar. The risk may be higher in patients who already have diabetes. Ask your care team what you can do to lower your   risk of diabetes while taking this medication. You should make sure you get enough vitamin B12 while you are taking this medication. Discuss the foods you eat and the vitamins you take with your care team. What side effects may I notice from receiving this medication? Side effects that you should report to your care  team as soon as possible: Allergic reactions--skin rash, itching, hives, swelling of the face, lips, tongue, or throat Gallbladder problems--severe stomach pain, nausea, vomiting, fever Heart rhythm changes--fast or irregular heartbeat, dizziness, feeling faint or lightheaded, chest pain, trouble breathing High blood sugar (hyperglycemia)--increased thirst or amount of urine, unusual weakness or fatigue, blurry vision Low blood sugar (hypoglycemia)--tremors or shaking, anxiety, sweating, cold or clammy skin, confusion, dizziness, rapid heartbeat Low thyroid levels (hypothyroidism)--unusual weakness or fatigue, increased sensitivity to cold, constipation, hair loss, dry skin, weight gain, feelings of depression Low vitamin B12 level--pain, tingling, or numbness in the hands or feet, muscle weakness, dizziness, confusion, trouble concentrating Pancreatitis--severe stomach pain that spreads to your back or gets worse after eating or when touched, fever, nausea, vomiting Side effects that usually do not require medical attention (report to your care team if they continue or are bothersome): Diarrhea Dizziness Gas Headache Pain, redness, or irritation at injection site Stomach pain This list may not describe all possible side effects. Call your doctor for medical advice about side effects. You may report side effects to FDA at 1-800-FDA-1088. Where should I keep my medication? Keep out of the reach of children and pets. Store in the refrigerator. Protect from light. Allow to come to room temperature naturally. Do not use artificial heat. If protected from light, the injection may be stored between 20 and 30 degrees C (70 and 86 degrees F) for 14 days. After the initial use, throw away any unused portion of a multiple dose vial after 14 days. Get rid of any unused portions of the ampules after use. To get rid of medications that are no longer needed or have expired: Take the medication to a medication  take-back program. Ask your pharmacy or law enforcement to find a location. If you cannot return the medication, ask your pharmacist or care team how to get rid of the medication safely. NOTE: This sheet is a summary. It may not cover all possible information. If you have questions about this medicine, talk to your doctor, pharmacist, or health care provider.  2023 Elsevier/Gold Standard (2007-09-12 00:00:00)  

## 2022-04-04 ENCOUNTER — Inpatient Hospital Stay (HOSPITAL_BASED_OUTPATIENT_CLINIC_OR_DEPARTMENT_OTHER): Payer: Medicare Other | Admitting: Oncology

## 2022-04-04 ENCOUNTER — Inpatient Hospital Stay: Payer: Medicare Other

## 2022-04-04 ENCOUNTER — Other Ambulatory Visit (HOSPITAL_COMMUNITY): Payer: Self-pay

## 2022-04-04 VITALS — BP 144/86 | HR 87 | Temp 98.3°F | Resp 16 | Ht 70.0 in | Wt 161.2 lb

## 2022-04-04 DIAGNOSIS — C7A8 Other malignant neuroendocrine tumors: Secondary | ICD-10-CM | POA: Diagnosis not present

## 2022-04-04 DIAGNOSIS — R197 Diarrhea, unspecified: Secondary | ICD-10-CM | POA: Diagnosis not present

## 2022-04-04 DIAGNOSIS — D3A8 Other benign neuroendocrine tumors: Secondary | ICD-10-CM

## 2022-04-04 DIAGNOSIS — R110 Nausea: Secondary | ICD-10-CM | POA: Diagnosis not present

## 2022-04-04 DIAGNOSIS — C7B8 Other secondary neuroendocrine tumors: Secondary | ICD-10-CM | POA: Diagnosis not present

## 2022-04-04 LAB — CBC WITH DIFFERENTIAL (CANCER CENTER ONLY)
Abs Immature Granulocytes: 0.01 10*3/uL (ref 0.00–0.07)
Basophils Absolute: 0 10*3/uL (ref 0.0–0.1)
Basophils Relative: 1 %
Eosinophils Absolute: 0.2 10*3/uL (ref 0.0–0.5)
Eosinophils Relative: 3 %
HCT: 38.3 % — ABNORMAL LOW (ref 39.0–52.0)
Hemoglobin: 13 g/dL (ref 13.0–17.0)
Immature Granulocytes: 0 %
Lymphocytes Relative: 9 %
Lymphs Abs: 0.5 10*3/uL — ABNORMAL LOW (ref 0.7–4.0)
MCH: 33.7 pg (ref 26.0–34.0)
MCHC: 33.9 g/dL (ref 30.0–36.0)
MCV: 99.2 fL (ref 80.0–100.0)
Monocytes Absolute: 0.6 10*3/uL (ref 0.1–1.0)
Monocytes Relative: 13 %
Neutro Abs: 3.7 10*3/uL (ref 1.7–7.7)
Neutrophils Relative %: 74 %
Platelet Count: 113 10*3/uL — ABNORMAL LOW (ref 150–400)
RBC: 3.86 MIL/uL — ABNORMAL LOW (ref 4.22–5.81)
RDW: 14.9 % (ref 11.5–15.5)
WBC Count: 5 10*3/uL (ref 4.0–10.5)
nRBC: 0 % (ref 0.0–0.2)

## 2022-04-04 LAB — CMP (CANCER CENTER ONLY)
ALT: 21 U/L (ref 0–44)
AST: 32 U/L (ref 15–41)
Albumin: 4.2 g/dL (ref 3.5–5.0)
Alkaline Phosphatase: 93 U/L (ref 38–126)
Anion gap: 8 (ref 5–15)
BUN: 27 mg/dL — ABNORMAL HIGH (ref 8–23)
CO2: 32 mmol/L (ref 22–32)
Calcium: 9.3 mg/dL (ref 8.9–10.3)
Chloride: 97 mmol/L — ABNORMAL LOW (ref 98–111)
Creatinine: 1.11 mg/dL (ref 0.61–1.24)
GFR, Estimated: >60 mL/min (ref >60)
Glucose, Bld: 95 mg/dL (ref 70–99)
Potassium: 4.5 mmol/L (ref 3.5–5.1)
Sodium: 137 mmol/L (ref 135–145)
Total Bilirubin: 1.4 mg/dL — ABNORMAL HIGH (ref 0.3–1.2)
Total Protein: 6.8 g/dL (ref 6.5–8.1)

## 2022-04-04 NOTE — Progress Notes (Signed)
[facility] OFFICE PROGRESS NOTE   Diagnosis: Pancreas neuroendocrine tumor  INTERVAL HISTORY:   Ronald Thompson returns as scheduled.  He continues cabozantinib.  He reports improvement in his energy level.  No rash or diarrhea.  No new complaint.  His activity level has increased.  Objective:  Vital signs in last 24 hours:  Blood pressure (!) 144/86, pulse 87, temperature 98.3 F (36.8 C), temperature source Oral, resp. rate 16, height '5\' 10"'$  (1.778 m), weight 161 lb 3.2 oz (73.1 kg), SpO2 95 %.    HEENT: No thrush or ulcers Resp: Lungs clear bilaterally Cardio: Regular rate and rhythm, 2/6 diastolic murmur GI: Liver is palpable throughout the upper abdomen Vascular: No leg edema   Lab Results:  Lab Results  Component Value Date   WBC 5.0 04/04/2022   HGB 13.0 04/04/2022   HCT 38.3 (L) 04/04/2022   MCV 99.2 04/04/2022   PLT 113 (L) 04/04/2022   NEUTROABS 3.7 04/04/2022    CMP  Lab Results  Component Value Date   NA 137 04/04/2022   K 4.5 04/04/2022   CL 97 (L) 04/04/2022   CO2 32 04/04/2022   GLUCOSE 95 04/04/2022   BUN 27 (H) 04/04/2022   CREATININE 1.11 04/04/2022   CALCIUM 9.3 04/04/2022   PROT 6.8 04/04/2022   ALBUMIN 4.2 04/04/2022   AST 32 04/04/2022   ALT 21 04/04/2022   ALKPHOS 93 04/04/2022   BILITOT 1.4 (H) 04/04/2022   GFRNONAA >60 04/04/2022   GFRAA >60 01/19/2020    Medications: I have reviewed the patient's current medications.   Assessment/Plan: Pancreatic neuroendocrine tumor, WHO grade 2, pancreatic head mass and small peripancreatic/celiac nodes on a CT 02/04/2014 EUS revealed evidence of multiple liver metastases-status post an FNA biopsy of a left liver lesion 02/10/2014 confirming a neuroendocrine tumor   Octreotide scan 04/01/2014 with multiple foci of metastatic neuroendocrine tumor in the liver Monthly Sandostatin started 03/16/2014 Restaging CT 07/04/2014 with resolution of a previously noted pancreas head lesion  and probable progression of liver metastases Restaging CT 10/31/2014-stable liver lesions, no evidence of a pancreas mass, stable. Restaging CT 02/28/2015-stable hepatic metastases, stable pancreas lesion unchanged Restaging CT 08/30/2015-stable pancreas mass, slight enlargement of hepatic metastases Monthly Sandostatin continued Restaging CTs 01/17/2016 revealed enlargement of liver lesions, no new lesions, enlargement of the pancreas head mass Gallium DOTATATE scan 10/01/2016 confirmed uptake in the liver metastases, pancreas primary, peripancreatic adenopathy, and left iliac bone Cycle 1 Lutathera 03/05/2017 Cycle 2 Lutathera 04/30/2017 Cycle 3 Lutathera 07/02/2017 Cycle 4 Lutathera 08/27/2017 Netspot scan 09/29/2017-decreased radiotracer activity within the pancreas head, peripancreatic lymph node, and solitary skeletal lesion.  Liver lesions have increased in size with a decrease in radiotracer activity Monthly Sandostatin continued Netspot scan 05/20/2018-stable to decreased size of liver lesions, most have decreased SUV, similar uptake in the pancreas lesion, previously noted peripancreatic lymph node has resolved him a mild decrease in uptake associated with a left iliac metastasis, no evidence of disease progression Monthly Sandostatin continued Netspot scan 01/29/2019- overall stable to improved, 1 lesion left hepatic lobe has increased tracer activity-unchanged in size, majority of hepatic lesions have reduced activity compared to the pretreatment scan, decreased activity left iliac bone lesion, stable activity in the head of the pancreas lesion, no new lesions Monthly Sandostatin continued Netspot on September 24, 2019-increase in size of 3 liver lesions with associated stable radiotracer activity, no new lesions, stable small left iliac lesion Cycle 1 salvage Lutathera 10/27/2019 Cycle 2 salvage Lutathera 12/22/2019 Netspot  on 02/15/2020-decrease in radiotracer activity in the liver  metastases with a decrease in size of several lesions, no new lesions, no progressive disease in the bowel or mesentery, decreased activity in the left iliac bone lesion Monthly Sandostatin continued  Netspot 10/13/2020-decrease in radiotracer activity in the left and right liver, decreased size of liver lesions, one lesion in the superior right liver has increased in size and has no radiotracer activity, no evidence of metastatic disease outside the liver PET 11/15/2020- moderate hypermetabolic activity associated with several enlarging liver lesions that had limited activity on the dotatate PET, a lesion in the left lateral hepatic lobe with mild persistent dotatate activity has mild peripheral FDG activity and is decreased in size from a dotatate PET 1 year ago.  No hypermetabolic activity in the pancreas or bowel.  No hypermetabolic activity in the previous dotatate avid left iliac lesion Monthly Sandostatin continued Cycle 1 capecitabine/temozolomide 01/22/2021 (capecitabine twice daily days 1 through 14 of each 28-day cycle/temozolomide days 10 through 14) Cycle 2 capecitabine/temozolomide 02/19/2021 Cycle 3 capecitabine/temozolomide 03/19/2021 Cycle 4 capecitabine/temozolomide 04/17/2021 PET 05/11/2021-slight increase in size of several FDG avid liver lesions, no new hepatic lesions, no evidence of metastatic disease to the chest or bones Cycle 5 capecitabine/temozolomide 05/16/2021 Cycle 6 capecitabine/temozolomide 06/25/2021, capecitabine dose reduced Cycle 7 capecitabine/temozolomide 07/23/2021 Cycle 8 capecitabine/temozolomide 08/20/2021 PET 09/13/2021-mildly progressive hepatic metastases Everolimus 10/11/2021-discontinued after 2 weeks secondary to cost Cabozantinib 12/13/2021 Cabozantinib discontinued 12/24/2021 Cabozantinib resumed at a reduced dose of 20 mg daily beginning 01/12/2022 PET 03/28/2022-multifocal tracer avid liver metastases, mild increase in size and able to mild increase in tracer  uptake, no extrahepatic disease identified Cabozantinib continued at a dose of 20 mg daily    Chest Seminoma in 1990 treated with BEP and chest radiation at [facility] 3. 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 chronic exertional dyspnea following treatment for the seminoma   5. aortic stenosis on an echocardiogram December 2011, severe aortic stenosis on echocardiogram 12/06/2015, status post TAVR on 04/16/2016 6. lumbar disc surgery 2014   7. acute abdominal pain 02/04/2014-resolved   8.  Diabetes 9.  COVID-19 infection July 2022       Disposition: Ronald Thompson has been treated with cabozantinib for the past 3 months.  He is tolerating cabozantinib at a reduced dose.  His performance status has improved over the past few weeks.  The chromogranin a was slightly higher on 03/12/2022, but has not changed significantly over the past 6 months.  The restaging PET reveals evidence of mild disease progression.  I reviewed the PET findings and images with Ronald Thompson.  There was a 34-monthinterval between the baseline PET and initiation of cabozantinib.  It is difficult to know whether the disease is progressing on cabozantinib.  He prefers continuing cabozantinib for now.  He will return for a lab visit 04/25/2022 and an office visit on 05/23/2022.  I will recommend changing to FOLFOX if there is clear evidence of disease progression.  GBetsy Coder MD  04/04/2022  5:28 PM

## 2022-04-25 ENCOUNTER — Inpatient Hospital Stay: Payer: Medicare Other | Attending: Oncology

## 2022-04-25 ENCOUNTER — Inpatient Hospital Stay: Payer: Medicare Other

## 2022-04-25 VITALS — BP 138/95 | HR 79 | Temp 97.6°F | Resp 18 | Ht 70.0 in | Wt 157.4 lb

## 2022-04-25 DIAGNOSIS — C7B8 Other secondary neuroendocrine tumors: Secondary | ICD-10-CM | POA: Insufficient documentation

## 2022-04-25 DIAGNOSIS — D3A8 Other benign neuroendocrine tumors: Secondary | ICD-10-CM

## 2022-04-25 DIAGNOSIS — C7A8 Other malignant neuroendocrine tumors: Secondary | ICD-10-CM | POA: Diagnosis not present

## 2022-04-25 LAB — CBC WITH DIFFERENTIAL (CANCER CENTER ONLY)
Abs Immature Granulocytes: 0.01 10*3/uL (ref 0.00–0.07)
Basophils Absolute: 0 10*3/uL (ref 0.0–0.1)
Basophils Relative: 0 %
Eosinophils Absolute: 0.1 10*3/uL (ref 0.0–0.5)
Eosinophils Relative: 3 %
HCT: 40.1 % (ref 39.0–52.0)
Hemoglobin: 13.5 g/dL (ref 13.0–17.0)
Immature Granulocytes: 0 %
Lymphocytes Relative: 10 %
Lymphs Abs: 0.5 10*3/uL — ABNORMAL LOW (ref 0.7–4.0)
MCH: 33.8 pg (ref 26.0–34.0)
MCHC: 33.7 g/dL (ref 30.0–36.0)
MCV: 100.5 fL — ABNORMAL HIGH (ref 80.0–100.0)
Monocytes Absolute: 0.5 10*3/uL (ref 0.1–1.0)
Monocytes Relative: 10 %
Neutro Abs: 3.6 10*3/uL (ref 1.7–7.7)
Neutrophils Relative %: 77 %
Platelet Count: 103 10*3/uL — ABNORMAL LOW (ref 150–400)
RBC: 3.99 MIL/uL — ABNORMAL LOW (ref 4.22–5.81)
RDW: 15 % (ref 11.5–15.5)
WBC Count: 4.8 10*3/uL (ref 4.0–10.5)
nRBC: 0 % (ref 0.0–0.2)

## 2022-04-25 LAB — CMP (CANCER CENTER ONLY)
ALT: 23 U/L (ref 0–44)
AST: 36 U/L (ref 15–41)
Albumin: 4.3 g/dL (ref 3.5–5.0)
Alkaline Phosphatase: 89 U/L (ref 38–126)
Anion gap: 8 (ref 5–15)
BUN: 25 mg/dL — ABNORMAL HIGH (ref 8–23)
CO2: 29 mmol/L (ref 22–32)
Calcium: 9.1 mg/dL (ref 8.9–10.3)
Chloride: 102 mmol/L (ref 98–111)
Creatinine: 1.17 mg/dL (ref 0.61–1.24)
GFR, Estimated: >60 mL/min (ref >60)
Glucose, Bld: 98 mg/dL (ref 70–99)
Potassium: 4.3 mmol/L (ref 3.5–5.1)
Sodium: 139 mmol/L (ref 135–145)
Total Bilirubin: 1.5 mg/dL — ABNORMAL HIGH (ref 0.3–1.2)
Total Protein: 6.7 g/dL (ref 6.5–8.1)

## 2022-04-25 MED ORDER — OCTREOTIDE ACETATE 30 MG IM KIT
30.0000 mg | PACK | Freq: Once | INTRAMUSCULAR | Status: AC
  Administered 2022-04-25: 30 mg via INTRAMUSCULAR
  Filled 2022-04-25: qty 1

## 2022-04-25 NOTE — Patient Instructions (Signed)
Octreotide Injection Solution What is this medication? OCTREOTIDE (ok TREE oh tide) treats high levels of growth hormone (acromegaly). It works by reducing the amount of growth hormone your body makes. This reduces symptoms and the risk of health problems caused by too much growth hormone, such as diabetes and heart disease. It may also be used to treat diarrhea caused by neuroendocrine tumors. It works by slowing down the release of serotonin from the tumor cells. This reduces the number of bowel movements you have. This medicine may be used for other purposes; ask your health care provider or pharmacist if you have questions. COMMON BRAND NAME(S): Bynfezia, Sandostatin What should I tell my care team before I take this medication? They need to know if you have any of these conditions: Diabetes Gallbladder disease Kidney disease Liver disease Thyroid disease An unusual or allergic reaction to octreotide, other medications, foods, dyes, or preservatives Pregnant or trying to get pregnant Breast-feeding How should I use this medication? This medication is injected under the skin or into a vein. It is usually given by your care team in a hospital or clinic setting. If you get this medication at home, you will be taught how to prepare and give it. Use exactly as directed. Take it as directed on the prescription label at the same time every day. Keep taking it unless your care team tells you to stop. Allow the injection solution to come to room temperature before use. Do not warm it artificially. It is important that you put your used needles and syringes in a special sharps container. Do not put them in a trash can. If you do not have a sharps container, call your pharmacist or care team to get one. Talk to your care team about the use of this medication in children. Special care may be needed. Overdosage: If you think you have taken too much of this medicine contact a poison control center or  emergency room at once. NOTE: This medicine is only for you. Do not share this medicine with others. What if I miss a dose? If you miss a dose, take it as soon as you can. If it is almost time for your next dose, take only that dose. Do not take double or extra doses. What may interact with this medication? Bromocriptine Certain medications for blood pressure, heart disease, irregular heartbeat Cyclosporine Diuretics Medications for diabetes, including insulin Quinidine This list may not describe all possible interactions. Give your health care provider a list of all the medicines, herbs, non-prescription drugs, or dietary supplements you use. Also tell them if you smoke, drink alcohol, or use illegal drugs. Some items may interact with your medicine. What should I watch for while using this medication? Visit your care team for regular checks on your progress. Tell your care team if your symptoms do not start to get better or if they get worse. To help reduce irritation at the injection site, use a different site for each injection and make sure the solution is at room temperature before use. This medication may cause decreases in blood sugar. Signs of low blood sugar include chills, cool, pale skin or cold sweats, drowsiness, extreme hunger, fast heartbeat, headache, nausea, nervousness or anxiety, shakiness, trembling, unsteadiness, tiredness, or weakness. Contact your care team right away if you experience any of these symptoms. This medication may increase blood sugar. The risk may be higher in patients who already have diabetes. Ask your care team what you can do to lower your   risk of diabetes while taking this medication. You should make sure you get enough vitamin B12 while you are taking this medication. Discuss the foods you eat and the vitamins you take with your care team. What side effects may I notice from receiving this medication? Side effects that you should report to your care  team as soon as possible: Allergic reactions--skin rash, itching, hives, swelling of the face, lips, tongue, or throat Gallbladder problems--severe stomach pain, nausea, vomiting, fever Heart rhythm changes--fast or irregular heartbeat, dizziness, feeling faint or lightheaded, chest pain, trouble breathing High blood sugar (hyperglycemia)--increased thirst or amount of urine, unusual weakness or fatigue, blurry vision Low blood sugar (hypoglycemia)--tremors or shaking, anxiety, sweating, cold or clammy skin, confusion, dizziness, rapid heartbeat Low thyroid levels (hypothyroidism)--unusual weakness or fatigue, increased sensitivity to cold, constipation, hair loss, dry skin, weight gain, feelings of depression Low vitamin B12 level--pain, tingling, or numbness in the hands or feet, muscle weakness, dizziness, confusion, trouble concentrating Pancreatitis--severe stomach pain that spreads to your back or gets worse after eating or when touched, fever, nausea, vomiting Side effects that usually do not require medical attention (report to your care team if they continue or are bothersome): Diarrhea Dizziness Gas Headache Pain, redness, or irritation at injection site Stomach pain This list may not describe all possible side effects. Call your doctor for medical advice about side effects. You may report side effects to FDA at 1-800-FDA-1088. Where should I keep my medication? Keep out of the reach of children and pets. Store in the refrigerator. Protect from light. Allow to come to room temperature naturally. Do not use artificial heat. If protected from light, the injection may be stored between 20 and 30 degrees C (70 and 86 degrees F) for 14 days. After the initial use, throw away any unused portion of a multiple dose vial after 14 days. Get rid of any unused portions of the ampules after use. To get rid of medications that are no longer needed or have expired: Take the medication to a medication  take-back program. Ask your pharmacy or law enforcement to find a location. If you cannot return the medication, ask your pharmacist or care team how to get rid of the medication safely. NOTE: This sheet is a summary. It may not cover all possible information. If you have questions about this medicine, talk to your doctor, pharmacist, or health care provider.  2023 Elsevier/Gold Standard (2007-09-12 00:00:00)  

## 2022-05-07 ENCOUNTER — Other Ambulatory Visit: Payer: Self-pay

## 2022-05-07 ENCOUNTER — Telehealth: Payer: Self-pay

## 2022-05-07 DIAGNOSIS — D3A8 Other benign neuroendocrine tumors: Secondary | ICD-10-CM

## 2022-05-07 MED ORDER — CABOZANTINIB S-MALATE 20 MG PO TABS: 20.0000 mg | ORAL_TABLET | Freq: Every day | ORAL | 1 refills | Status: DC

## 2022-05-07 NOTE — Telephone Encounter (Signed)
Mr. Fei called in and request a refill of his Cabometyx. Per [name] its ok to refill

## 2022-05-09 ENCOUNTER — Ambulatory Visit (INDEPENDENT_AMBULATORY_CARE_PROVIDER_SITE_OTHER): Payer: Medicare Other | Admitting: Internal Medicine

## 2022-05-09 VITALS — BP 120/82 | HR 87 | Temp 98.2°F | Ht 70.0 in | Wt 161.0 lb

## 2022-05-09 DIAGNOSIS — E538 Deficiency of other specified B group vitamins: Secondary | ICD-10-CM

## 2022-05-09 DIAGNOSIS — R351 Nocturia: Secondary | ICD-10-CM

## 2022-05-09 DIAGNOSIS — N1831 Chronic kidney disease, stage 3a: Secondary | ICD-10-CM | POA: Diagnosis not present

## 2022-05-09 DIAGNOSIS — E7800 Pure hypercholesterolemia, unspecified: Secondary | ICD-10-CM | POA: Diagnosis not present

## 2022-05-09 DIAGNOSIS — E559 Vitamin D deficiency, unspecified: Secondary | ICD-10-CM

## 2022-05-09 DIAGNOSIS — Z23 Encounter for immunization: Secondary | ICD-10-CM

## 2022-05-09 DIAGNOSIS — N320 Bladder-neck obstruction: Secondary | ICD-10-CM

## 2022-05-09 DIAGNOSIS — I10 Essential (primary) hypertension: Secondary | ICD-10-CM | POA: Diagnosis not present

## 2022-05-09 DIAGNOSIS — E119 Type 2 diabetes mellitus without complications: Secondary | ICD-10-CM

## 2022-05-09 LAB — LIPID PANEL
Cholesterol: 135 mg/dL (ref 0–200)
HDL: 40.6 mg/dL (ref >39.00)
LDL Cholesterol: 80 mg/dL (ref 0–99)
NonHDL: 94.12
Total CHOL/HDL Ratio: 3
Triglycerides: 71 mg/dL (ref 0.0–149.0)
VLDL: 14.2 mg/dL (ref 0.0–40.0)

## 2022-05-09 LAB — PSA: PSA: 0.97 ng/mL (ref 0.10–4.00)

## 2022-05-09 LAB — VITAMIN D 25 HYDROXY (VIT D DEFICIENCY, FRACTURES): VITD: 72.86 ng/mL (ref 30.00–100.00)

## 2022-05-09 LAB — TSH: TSH: 2.7 u[IU]/mL (ref 0.35–5.50)

## 2022-05-09 LAB — HEMOGLOBIN A1C: Hgb A1c MFr Bld: 6.2 % (ref 4.6–6.5)

## 2022-05-09 LAB — VITAMIN B12: Vitamin B-12: 443 pg/mL (ref 211–911)

## 2022-05-09 MED ORDER — TAMSULOSIN HCL 0.4 MG PO CAPS: 0.4000 mg | ORAL_CAPSULE | Freq: Every day | ORAL | 3 refills | Status: DC

## 2022-05-09 NOTE — Progress Notes (Signed)
Patient ID: Ronald Thompson, male   DOB: [birth year], 66 y.o.   MRN: [id number]        Chief Complaint: follow up HTN, HLD and hyperglycemia. nocturia       HPI:  Ronald Thompson is a 66 y.o. male here overall ok, Has regained some wt, has declines nutrition referral, now liberlizing diet.  But still concerned about overall sugar. Denies urinary symptoms such as dysuria, frequency, urgency, flank pain, hematuria or n/v, fever, chills, but Does have nocturia up to 2-3 times per night.  Moved to 2 br apartment [place] in [place] area - independent.  Wife continues to slow with dementia.  Plans to see optho soon  - Dr Gershon Crane, and Tdap at the pharmacy.  Pt denies chest pain, increased sob or doe, wheezing, orthopnea, PND, increased LE swelling, palpitations, dizziness or syncope.   Pt denies polydipsia, polyuria, or new focal neuro s/s.   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 25 yrs ago-during cancer tx.   Past Surgical History:  Procedure Laterality Date   BACK SURGERY     '14- rupt. disc    CARDIAC CATHETERIZATION N/A 01/24/2016   Procedure: Right/Left Heart Cath and Coronary Angiography;  Surgeon: [name], MD;  Location: [facility];  Service: Cardiovascular;  Laterality: N/A;   COLONOSCOPY     EUS N/A 02/10/2014   Procedure: UPPER ENDOSCOPIC ULTRASOUND (EUS) LINEAR;  Surgeon: [name], MD;  Location: [facility];  Service: Endoscopy;  Laterality: N/A;   IR RADIOLOGIST EVAL & MGMT  02/11/2017   KNEE SURGERY Right    age 30 for Baker's cyst   NASAL SEPTUM SURGERY     Deviated septrum   TEE WITHOUT CARDIOVERSION N/A 04/16/2016   Procedure: TRANSESOPHAGEAL ECHOCARDIOGRAM (TEE);  Surgeon: [name], MD;  Location: [facility];  Service: Open Heart Surgery;  Laterality: N/A;   THORACOTOMY  1990   wedge biopsy of mediastinal mass   TRANSCATHETER AORTIC VALVE REPLACEMENT, TRANSFEMORAL N/A 04/16/2016   Procedure: TRANSCATHETER AORTIC VALVE REPLACEMENT, TRANSFEMORAL;  Surgeon: [name], MD;  Location: [facility];  Service: Open Heart Surgery;  Laterality: N/A;  reports that he has never smoked. He has never used smokeless tobacco. He reports current alcohol use. He reports that he does not use drugs. family history includes Cancer in his maternal aunt and paternal grandfather; Dementia in his mother; Emphysema in his maternal aunt. Allergies  Allergen Reactions   Penicillins Hives    ENTIRE BODY Has patient had a PCN reaction causing immediate rash, facial/tongue/throat swelling, SOB or lightheadedness with hypotension: No Has patient had a PCN reaction causing severe rash involving mucus membranes or skin necrosis: No Has patient had a PCN reaction that required hospitalization * *  YES  * * Has patient had a PCN reaction occurring  within the last 10 years: No If all of the above answers are "NO", then may proceed with Cephalosporin use. *reaction occurred when he was 17   Current Outpatient Medications on File Prior to Visit  Medication Sig Dispense Refill   acetaminophen (TYLENOL) 500 MG tablet Take 1,000 mg by mouth every 6 (six) hours as needed for headache.     cabozantinib (CABOMETYX) 20 MG tablet Take 1 tablet (20 mg total) by mouth daily. Take on an empty stomach, 1 hour before or 2 hours after meals. 30 tablet 1   calcium carbonate (TUMS - DOSED IN MG ELEMENTAL CALCIUM) 500 MG chewable tablet Chew 3 tablets by mouth 3 (three) times daily as needed for indigestion or heartburn.     Cholecalciferol (VITAMIN D3) 1.25 MG (50000 UT) CAPS Take 1 capsule by mouth daily.     clindamycin (CLEOCIN) 300 MG capsule Take 2 capsules by mouth one hour prior to dental appointment 6 capsule 2   docusate sodium (COLACE) 100 MG capsule Take 100 mg by mouth daily as needed for mild constipation.     Lancet Device MISC Use as instructed to test blood sugar daily E11.65 1 each 2   Lancets (ONETOUCH DELICA PLUS [id number]) MISC TEST ONCE DAILY 100 each 2   losartan (COZAAR) 100 MG tablet Take 1 tablet (100 mg total) by mouth daily. 90 tablet 3   Octreotide Acetate (SANDOSTATIN IJ) Inject 1 each as directed every 30 (thirty) days.      ondansetron (ZOFRAN) 8 MG tablet Take 1 tablet (8 mg total) by mouth as directed. Take one tablet 30 minutes prior to each Temodar dose and every 8 hours as needed for nausea 30 tablet 1   sildenafil (VIAGRA) 100 MG tablet TAKE ONE HALF TO ONE TABLET BY MOUTH DAILY AS NEEDED FOR FOR ERECTILE DYSFUNCTION 5 tablet 10   simvastatin (ZOCOR) 20 MG tablet TAKE ONE TABLET BY MOUTH DAILY AT 8PM 90 tablet 3   albuterol (VENTOLIN HFA) 108 (90 Base) MCG/ACT inhaler Inhale 1-2 puffs into the lungs every 6 (six) hours as needed for wheezing or shortness of breath. (Patient not taking: Reported on 04/04/2022) 1 each 4    Current Facility-Administered Medications on File Prior to Visit  Medication Dose Route Frequency Provider Last Rate Last Admin   heparin lock flush 100 unit/mL  500 Units Intravenous Once Ladell Pier, MD       octreotide (SANDOSTATIN LAR) 30 MG IM injection                 ROS:  All others reviewed and negative.  Objective        PE:  BP 120/82   Pulse 87   Temp 98.2 F (36.8 C) (Oral)   Ht '5\' 10"'$  (1.778 m)   Wt 161 lb (73 kg)  SpO2 91%   BMI 23.10 kg/m                 Constitutional: Pt appears in NAD               HENT: Head: NCAT.                Right Ear: External ear normal.                 Left Ear: External ear normal.                Eyes: . Pupils are equal, round, and reactive to light. Conjunctivae and EOM are normal               Nose: without d/c or deformity               Neck: Neck supple. Gross normal ROM               Cardiovascular: Normal rate and regular rhythm.                 Pulmonary/Chest: Effort normal and breath sounds without rales or wheezing.                Abd:  Soft, NT, ND, + BS, no organomegaly               Neurological: Pt is alert. At baseline orientation, motor grossly intact               Skin: Skin is warm. No rashes, no other new lesions, LE edema - none               Psychiatric: Pt behavior is normal without agitation   Micro: none  Cardiac tracings I have personally interpreted today:  none  Pertinent Radiological findings (summarize): none   Lab Results  Component Value Date   WBC 4.8 04/25/2022   HGB 13.5 04/25/2022   HCT 40.1 04/25/2022   PLT 103 (L) 04/25/2022   GLUCOSE 98 04/25/2022   CHOL 135 05/09/2022   TRIG 71.0 05/09/2022   HDL 40.60 05/09/2022   LDLDIRECT 139.2 10/10/2008   LDLCALC 80 05/09/2022   ALT 23 04/25/2022   AST 36 04/25/2022   NA 139 04/25/2022   K 4.3 04/25/2022   CL 102 04/25/2022   CREATININE 1.17 04/25/2022   BUN 25 (H) 04/25/2022   CO2 29 04/25/2022   TSH 2.70 05/09/2022   PSA  0.97 05/09/2022   INR 1.0 10/16/2021   HGBA1C 6.2 05/09/2022   MICROALBUR 2.7 (H) 08/08/2021   Assessment/Plan:  Ronald Thompson is a 66 y.o. White or Caucasian [1] male with  has a past medical history of Baker's cyst, Gallstones, Heart murmur, Hypercholesteremia, Hypertension, Lesion of left lung, Liver metastasis, Occlusion of left subclavian vein ([place]) (1990), Pericarditis, Pneumonia (1990), Primary neuroendocrine tumor of pancreas (02/14/2014), Pulmonary fibrosis ([place]) (11/08/2015), S/P TAVR (transcatheter aortic valve replacement) (04/16/2016), Shortness of breath dyspnea, Testicular seminoma ([place]) (1990), and Transfusion history.  Type 2 diabetes mellitus without complication, without long-term current use of insulin (HCC) Lab Results  Component Value Date   HGBA1C 6.2 05/09/2022   Stable, pt to continue current medical treatment  - diet, wt control, excercise   Hyperlipidemia Lab Results  Component Value Date   [place] 80 05/09/2022   Uncontrolled, goal ldl < 70,, pt to continue current statin zocor 20 mg qd as decilnes change   HTN (hypertension) BP Readings from Last  3 Encounters:  05/09/22 120/82  04/25/22 (!) 138/95  04/04/22 (!) 144/86   Stable, pt to continue medical treatment losartan 100 mg qd   CKD (chronic kidney disease) stage 3, GFR 30-59 ml/min (HCC) Lab Results  Component Value Date   CREATININE 1.17 04/25/2022   Stable overall, cont to avoid nephrotoxins   Vitamin D deficiency Last vitamin D Lab Results  Component Value Date   VD25OH 72.86 05/09/2022   Stable, cont oral replacement   Nocturia ? bph related, for trial flomax 0.4 mg qd, consider oab med trial but seems less likely, consider urology referral if worsening  Followup: No follow-ups on file.  Cathlean Cower, MD 05/11/2022 9:16 PM [facility]

## 2022-05-09 NOTE — Patient Instructions (Addendum)
Please remember to see your eye doctor for yearly appt.   You had the flu shot today  Please take all new medication as prescribed - the flomax (but OK to STOP if you dont seem any improvement)  Please continue all other medications as before, and refills have been done if requested.  Please have the pharmacy call with any other refills you may need.  Please continue your efforts at being more active, low cholesterol diet, and weight control.  You are otherwise up to date with prevention measures today.  Please keep your appointments with your specialists as you may have planned  Please go to the LAB at the blood drawing area for the tests to be done  You will be contacted by phone if any changes need to be made immediately.  Otherwise, you will receive a letter about your results with an explanation, but please check with MyChart first.  Please make an Appointment to return in 6 months, or sooner if needed

## 2022-05-11 NOTE — Assessment & Plan Note (Signed)
Last vitamin D Lab Results  Component Value Date   VD25OH 72.86 05/09/2022   Stable, cont oral replacement

## 2022-05-11 NOTE — Assessment & Plan Note (Signed)
Lab Results  Component Value Date   LDLCALC 80 05/09/2022   Uncontrolled, goal ldl < 70,, pt to continue current statin zocor 20 mg qd as decilnes change

## 2022-05-11 NOTE — Assessment & Plan Note (Signed)
BP Readings from Last 3 Encounters:  05/09/22 120/82  04/25/22 (!) 138/95  04/04/22 (!) 144/86   Stable, pt to continue medical treatment losartan 100 mg qd

## 2022-05-11 NOTE — Assessment & Plan Note (Signed)
Lab Results  Component Value Date   HGBA1C 6.2 05/09/2022   Stable, pt to continue current medical treatment  - diet, wt control, excercise

## 2022-05-11 NOTE — Assessment & Plan Note (Signed)
Lab Results  Component Value Date   CREATININE 1.17 04/25/2022   Stable overall, cont to avoid nephrotoxins

## 2022-05-11 NOTE — Assessment & Plan Note (Signed)
?   bph related, for trial flomax 0.4 mg qd, consider oab med trial but seems less likely, consider urology referral if worsening

## 2022-05-23 ENCOUNTER — Inpatient Hospital Stay: Payer: Medicare Other

## 2022-05-23 ENCOUNTER — Inpatient Hospital Stay: Payer: Medicare Other | Attending: Oncology | Admitting: Oncology

## 2022-05-23 VITALS — BP 127/78 | HR 83 | Temp 98.1°F | Resp 18 | Ht 70.0 in | Wt 159.6 lb

## 2022-05-23 DIAGNOSIS — C7B8 Other secondary neuroendocrine tumors: Secondary | ICD-10-CM | POA: Diagnosis not present

## 2022-05-23 DIAGNOSIS — D3A8 Other benign neuroendocrine tumors: Secondary | ICD-10-CM

## 2022-05-23 DIAGNOSIS — C7A8 Other malignant neuroendocrine tumors: Secondary | ICD-10-CM | POA: Insufficient documentation

## 2022-05-23 LAB — CMP (CANCER CENTER ONLY)
ALT: 27 U/L (ref 0–44)
AST: 37 U/L (ref 15–41)
Albumin: 4.3 g/dL (ref 3.5–5.0)
Alkaline Phosphatase: 94 U/L (ref 38–126)
Anion gap: 7 (ref 5–15)
BUN: 25 mg/dL — ABNORMAL HIGH (ref 8–23)
CO2: 29 mmol/L (ref 22–32)
Calcium: 9.5 mg/dL (ref 8.9–10.3)
Chloride: 99 mmol/L (ref 98–111)
Creatinine: 1.19 mg/dL (ref 0.61–1.24)
GFR, Estimated: >60 mL/min (ref >60)
Glucose, Bld: 97 mg/dL (ref 70–99)
Potassium: 4.3 mmol/L (ref 3.5–5.1)
Sodium: 135 mmol/L (ref 135–145)
Total Bilirubin: 1.5 mg/dL — ABNORMAL HIGH (ref 0.3–1.2)
Total Protein: 6.8 g/dL (ref 6.5–8.1)

## 2022-05-23 LAB — CBC WITH DIFFERENTIAL (CANCER CENTER ONLY)
Abs Immature Granulocytes: 0.01 10*3/uL (ref 0.00–0.07)
Basophils Absolute: 0 10*3/uL (ref 0.0–0.1)
Basophils Relative: 1 %
Eosinophils Absolute: 0.1 10*3/uL (ref 0.0–0.5)
Eosinophils Relative: 3 %
HCT: 39 % (ref 39.0–52.0)
Hemoglobin: 13.1 g/dL (ref 13.0–17.0)
Immature Granulocytes: 0 %
Lymphocytes Relative: 10 %
Lymphs Abs: 0.4 10*3/uL — ABNORMAL LOW (ref 0.7–4.0)
MCH: 34.1 pg — ABNORMAL HIGH (ref 26.0–34.0)
MCHC: 33.6 g/dL (ref 30.0–36.0)
MCV: 101.6 fL — ABNORMAL HIGH (ref 80.0–100.0)
Monocytes Absolute: 0.6 10*3/uL (ref 0.1–1.0)
Monocytes Relative: 13 %
Neutro Abs: 3.4 10*3/uL (ref 1.7–7.7)
Neutrophils Relative %: 73 %
Platelet Count: 117 10*3/uL — ABNORMAL LOW (ref 150–400)
RBC: 3.84 MIL/uL — ABNORMAL LOW (ref 4.22–5.81)
RDW: 15 % (ref 11.5–15.5)
WBC Count: 4.6 10*3/uL (ref 4.0–10.5)
nRBC: 0 % (ref 0.0–0.2)

## 2022-05-23 MED ORDER — OCTREOTIDE ACETATE 30 MG IM KIT
30.0000 mg | PACK | Freq: Once | INTRAMUSCULAR | Status: AC
  Administered 2022-05-23: 30 mg via INTRAMUSCULAR
  Filled 2022-05-23: qty 1

## 2022-05-23 NOTE — Progress Notes (Signed)
[facility] OFFICE PROGRESS NOTE   Diagnosis: Pancreas neuroendocrine tumor  INTERVAL HISTORY:   Ronald Thompson returns as scheduled.  He continues cabozantinib.  He has intermittent diarrhea.  He has exertional dyspnea.  He reports malaise.  He fatigues easily.  No pain.  Objective:  Vital signs in last 24 hours:  Blood pressure 127/78, pulse 83, temperature 98.1 F (36.7 C), temperature source Oral, resp. rate 18, height '5\' 10"'$  (1.778 m), weight 159 lb 9.6 oz (72.4 kg), SpO2 98 %.    HEENT: No thrush, 1-2 mm healing ulcer at the distal left side of the tongue Resp: Decreased breath sounds at the left compared to the right chest, no respiratory distress Cardio: Regular rate and rhythm GI: The liver is palpable throughout the upper abdomen extending below the umbilicus and to the left of the midline Vascular: No leg edema   Lab Results:  Lab Results  Component Value Date   WBC 4.6 05/23/2022   HGB 13.1 05/23/2022   HCT 39.0 05/23/2022   MCV 101.6 (H) 05/23/2022   PLT 117 (L) 05/23/2022   NEUTROABS 3.4 05/23/2022    CMP  Lab Results  Component Value Date   NA 135 05/23/2022   K 4.3 05/23/2022   CL 99 05/23/2022   CO2 29 05/23/2022   GLUCOSE 97 05/23/2022   BUN 25 (H) 05/23/2022   CREATININE 1.19 05/23/2022   CALCIUM 9.5 05/23/2022   PROT 6.8 05/23/2022   ALBUMIN 4.3 05/23/2022   AST 37 05/23/2022   ALT 27 05/23/2022   ALKPHOS 94 05/23/2022   BILITOT 1.5 (H) 05/23/2022   GFRNONAA >60 05/23/2022   GFRAA >60 01/19/2020    Medications: I have reviewed the patient's current medications.   Assessment/Plan: Pancreatic neuroendocrine tumor, WHO grade 2, pancreatic head mass and small peripancreatic/celiac nodes on a CT 02/04/2014 EUS revealed evidence of multiple liver metastases-status post an FNA biopsy of a left liver lesion 02/10/2014 confirming a neuroendocrine tumor   Octreotide scan 04/01/2014 with multiple foci of metastatic neuroendocrine tumor  in the liver Monthly Sandostatin started 03/16/2014 Restaging CT 07/04/2014 with resolution of a previously noted pancreas head lesion and probable progression of liver metastases Restaging CT 10/31/2014-stable liver lesions, no evidence of a pancreas mass, stable. Restaging CT 02/28/2015-stable hepatic metastases, stable pancreas lesion unchanged Restaging CT 08/30/2015-stable pancreas mass, slight enlargement of hepatic metastases Monthly Sandostatin continued Restaging CTs 01/17/2016 revealed enlargement of liver lesions, no new lesions, enlargement of the pancreas head mass Gallium DOTATATE scan 10/01/2016 confirmed uptake in the liver metastases, pancreas primary, peripancreatic adenopathy, and left iliac bone Cycle 1 Lutathera 03/05/2017 Cycle 2 Lutathera 04/30/2017 Cycle 3 Lutathera 07/02/2017 Cycle 4 Lutathera 08/27/2017 Netspot scan 09/29/2017-decreased radiotracer activity within the pancreas head, peripancreatic lymph node, and solitary skeletal lesion.  Liver lesions have increased in size with a decrease in radiotracer activity Monthly Sandostatin continued Netspot scan 05/20/2018-stable to decreased size of liver lesions, most have decreased SUV, similar uptake in the pancreas lesion, previously noted peripancreatic lymph node has resolved him a mild decrease in uptake associated with a left iliac metastasis, no evidence of disease progression Monthly Sandostatin continued Netspot scan 01/29/2019- overall stable to improved, 1 lesion left hepatic lobe has increased tracer activity-unchanged in size, majority of hepatic lesions have reduced activity compared to the pretreatment scan, decreased activity left iliac bone lesion, stable activity in the head of the pancreas lesion, no new lesions Monthly Sandostatin continued Netspot on September 24, 2019-increase in size of 3  liver lesions with associated stable radiotracer activity, no new lesions, stable small left iliac lesion Cycle 1  salvage Lutathera 10/27/2019 Cycle 2 salvage Lutathera 12/22/2019 Netspot on 02/15/2020-decrease in radiotracer activity in the liver metastases with a decrease in size of several lesions, no new lesions, no progressive disease in the bowel or mesentery, decreased activity in the left iliac bone lesion Monthly Sandostatin continued  Netspot 10/13/2020-decrease in radiotracer activity in the left and right liver, decreased size of liver lesions, one lesion in the superior right liver has increased in size and has no radiotracer activity, no evidence of metastatic disease outside the liver PET 11/15/2020- moderate hypermetabolic activity associated with several enlarging liver lesions that had limited activity on the dotatate PET, a lesion in the left lateral hepatic lobe with mild persistent dotatate activity has mild peripheral FDG activity and is decreased in size from a dotatate PET 1 year ago.  No hypermetabolic activity in the pancreas or bowel.  No hypermetabolic activity in the previous dotatate avid left iliac lesion Monthly Sandostatin continued Cycle 1 capecitabine/temozolomide 01/22/2021 (capecitabine twice daily days 1 through 14 of each 28-day cycle/temozolomide days 10 through 14) Cycle 2 capecitabine/temozolomide 02/19/2021 Cycle 3 capecitabine/temozolomide 03/19/2021 Cycle 4 capecitabine/temozolomide 04/17/2021 PET 05/11/2021-slight increase in size of several FDG avid liver lesions, no new hepatic lesions, no evidence of metastatic disease to the chest or bones Cycle 5 capecitabine/temozolomide 05/16/2021 Cycle 6 capecitabine/temozolomide 06/25/2021, capecitabine dose reduced Cycle 7 capecitabine/temozolomide 07/23/2021 Cycle 8 capecitabine/temozolomide 08/20/2021 PET 09/13/2021-mildly progressive hepatic metastases Everolimus 10/11/2021-discontinued after 2 weeks secondary to cost Cabozantinib 12/13/2021 Cabozantinib discontinued 12/24/2021 Cabozantinib resumed at a reduced dose of 20 mg daily  beginning 01/12/2022 PET 03/28/2022-multifocal tracer avid liver metastases, mild increase in size and able to mild increase in tracer uptake, no extrahepatic disease identified Cabozantinib continued at a dose of 20 mg daily    Chest Seminoma in 1990 treated with BEP and chest radiation at [facility] 3. 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 chronic exertional dyspnea following treatment for the seminoma   5. aortic stenosis on an echocardiogram December 2011, severe aortic stenosis on echocardiogram 12/06/2015, status post TAVR on 04/16/2016 6. lumbar disc surgery 2014   7. acute abdominal pain 02/04/2014-resolved   8.  Diabetes 9.  COVID-19 infection July 2022       Disposition: Ronald Thompson appears stable.  He will continue cabozantinib and monthly Sandostatin.  We will follow-up on the chromogranin A level from today.  The plan is to obtain a restaging PET within the next few months.  We discussed the plan to proceed with salvage FOLFOX chemotherapy when there is clear evidence of disease progression.  He will complete a Sandostatin injection today.  He will return for an office visit in 4 weeks.  Betsy Coder, MD  05/23/2022  9:28 AM

## 2022-05-23 NOTE — Progress Notes (Signed)
Patient seen by Dr. Sherrill today ? ?Vitals are within treatment parameters. ? ?Labs reviewed by Dr. Sherrill and are within treatment parameters. ? ?Per physician team, patient is ready for treatment and there are NO modifications to the treatment plan.  ?

## 2022-05-24 LAB — CHROMOGRANIN A: Chromogranin A (ng/mL): 32660 ng/mL — ABNORMAL HIGH (ref 0.0–101.8)

## 2022-06-12 ENCOUNTER — Other Ambulatory Visit (HOSPITAL_COMMUNITY): Payer: Medicare Other

## 2022-06-15 DIAGNOSIS — Z923 Personal history of irradiation: Secondary | ICD-10-CM | POA: Diagnosis not present

## 2022-06-15 DIAGNOSIS — J40 Bronchitis, not specified as acute or chronic: Secondary | ICD-10-CM | POA: Diagnosis not present

## 2022-06-15 DIAGNOSIS — J029 Acute pharyngitis, unspecified: Secondary | ICD-10-CM | POA: Diagnosis not present

## 2022-06-15 DIAGNOSIS — R059 Cough, unspecified: Secondary | ICD-10-CM | POA: Diagnosis not present

## 2022-06-15 DIAGNOSIS — R030 Elevated blood-pressure reading, without diagnosis of hypertension: Secondary | ICD-10-CM | POA: Diagnosis not present

## 2022-06-15 DIAGNOSIS — Z85118 Personal history of other malignant neoplasm of bronchus and lung: Secondary | ICD-10-CM | POA: Diagnosis not present

## 2022-06-15 DIAGNOSIS — Z0130 Encounter for examination of blood pressure without abnormal findings: Secondary | ICD-10-CM | POA: Diagnosis not present

## 2022-06-17 ENCOUNTER — Encounter: Payer: Self-pay | Admitting: Cardiovascular Disease

## 2022-06-17 NOTE — Progress Notes (Unsigned)
Cardiology Office Note   Date:  06/18/2022   ID:  Ronald Thompson, DOB 02-14-1956, MRN [id number]  PCP:  [name], MD  Cardiologist:   Mertie Moores, MD   Chief Complaint  Patient presents with   Aortic Stenosis   TAVR   Problem list 1. Aortic stenosis - likely had a bicuspid AV  2. Essential hypertension 3. Permanent fibrosis 4. Hyperlipidemia 5. Metastatic seminoma - to his chest ,  S/p chemo and XRT  Took VP 16 - caused some pulmonary fibrosis  6. Neuroendocrine tumur - Pancreatic cancer with mets to Liver .    Notes from 2017:  Ronald Thompson is a 66 y.o. male who presents for evaluation of his aortic stenosis. He has a history of metastatic seminoma up to his chest. He received high-dose chemotherapy and XRT .   It also accompanied some of his heart and extended down to the diaphragm.  2 years ago he was diagnosed with a neuroendocrine tumor-likely to be pancreatic cancer.  Gets saldostatin monthly .  He has some metastases to his liver.   He sees Ronald Thompson  Has been told that there is no cure, but the growth rate has been slowed dramaticlly .    Has had dyspnea for the past 25 years- since the seminoma was treated .  Has significant DOE but he is able to lift weights.  Does have DOE with walking up hills or climbing stairs.  Does ok on level ground .   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 He had TAVR on April 16, 2016.  He is overall done very well. He has a complex medical history including metastatic pancreatic neuroendocrine tumor.  Still has DOE. And fatigue  Has pulmonary fibrosis from the chemo ( VP 16, bleomycin, radiation therapy)   Still working through the neuroendocrine tumor PET scan in several week  No CP  Does not jog. Works  [employer] on a level surface without difficulities.   BP is typically well controlled.   Sept. 29, 2020  Doing well .    Chronically short of breath  Has pulmonary fibrosis from chemo   Nov. 9, 2021:  Ronald Thompson is doing well.   Has lost 10-12 lbs.  Was diagnosed with DM2 TAVR is working well No dyspnea.   Can walk several miles without difficulty  Had his TAVR 4 years ago .    Nov. 15, 2022: Seen with Wife, Ronald Thompson.  Ronald Thompson is doing well.    S/p TAVR 5 years ago .  Still getting chemo every 2 weeks  No CP, Chronic dyspnea from pulmonary fibrosis from the chemo   Nov. 14, 2023 Ronald Thompson is seen  for follow up of his TAVR  S/p TAVR 6 years ago  Hx of a neuroendocrine tumor,  the chemo has caused some pulmonary fibrosis  Admits that the cancer is taking its toll now, liver lesions are growing .  Sees Dr. [name] for his cancer  Unable to exercise because of weakness ( Cabometyx) and dyspnea   Wife, Ronald Thompson has alzheimers  Wt is 157 lbs     Past Medical History:  Diagnosis Date   Baker's cyst    Gallstones    Heart murmur    Hypercholesteremia    Hypertension    Lesion of left lung    hx.25 yrs ago- testicilar cancer related- only scarring  left after tx.-no problems now   Liver metastasis    Occlusion of left subclavian vein ([place]) 1990   and Brachiocephalic- DVT   during chemo left subclavian are larger than right.   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 25 yrs ago-during cancer tx.    Past Surgical History:  Procedure Laterality Date   BACK SURGERY     '14- rupt. disc    CARDIAC CATHETERIZATION  N/A 01/24/2016   Procedure: Right/Left Heart Cath and Coronary Angiography;  Surgeon: [name], MD;  Location: [facility];  Service: Cardiovascular;  Laterality: N/A;   COLONOSCOPY     EUS N/A 02/10/2014   Procedure: UPPER ENDOSCOPIC ULTRASOUND (EUS) LINEAR;  Surgeon: [name], MD;  Location: [facility];  Service: Endoscopy;  Laterality: N/A;   IR RADIOLOGIST EVAL & MGMT  02/11/2017   KNEE SURGERY Right    age 35 for Baker's cyst   NASAL SEPTUM SURGERY     Deviated septrum   TEE WITHOUT CARDIOVERSION N/A 04/16/2016   Procedure: TRANSESOPHAGEAL ECHOCARDIOGRAM (TEE);  Surgeon: [name], MD;  Location: [facility];  Service: Open Heart Surgery;  Laterality: N/A;   THORACOTOMY  1990   wedge biopsy of mediastinal mass   TRANSCATHETER AORTIC VALVE REPLACEMENT, TRANSFEMORAL N/A 04/16/2016   Procedure: TRANSCATHETER AORTIC VALVE REPLACEMENT, TRANSFEMORAL;  Surgeon: [name], MD;  Location: [facility];  Service: Open Heart Surgery;  Laterality: N/A;     Current Outpatient Medications  Medication Sig Dispense Refill   acetaminophen (TYLENOL) 500 MG tablet Take 1,000 mg by mouth every 6 (six) hours as needed for headache.     albuterol (VENTOLIN HFA) 108 (90 Base) MCG/ACT inhaler Inhale 1-2 puffs into the lungs every 6 (six) hours as needed for wheezing or shortness of breath. 1 each 4   cabozantinib (CABOMETYX) 20 MG tablet Take 1 tablet (20 mg total) by mouth daily. Take on an empty stomach, 1 hour before or 2 hours after meals. 30 tablet 1   calcium carbonate (TUMS - DOSED IN MG ELEMENTAL CALCIUM) 500 MG chewable tablet Chew 3 tablets by mouth 3 (three) times daily as needed for indigestion or heartburn.     Cholecalciferol (VITAMIN D-3) 125 MCG (5000 UT) TABS Take 1 tablet by mouth daily in the afternoon.     clindamycin (CLEOCIN) 300 MG capsule Take 2 capsules by mouth one hour prior to dental appointment 6 capsule 2   diphenhydrAMINE (BENADRYL) 50 MG capsule Take 50 mg by mouth  at bedtime as needed for sleep.     docusate sodium (COLACE) 100 MG capsule Take 100 mg by mouth daily as needed for mild constipation.     Lancet Device MISC Use as instructed to test blood sugar daily E11.65 1 each 2   Lancets (ONETOUCH DELICA PLUS [id number]) MISC TEST ONCE DAILY 100 each 2   loperamide (IMODIUM) 2 MG capsule Take 2 mg by mouth as needed for diarrhea or  loose stools.     losartan (COZAAR) 100 MG tablet Take 1 tablet (100 mg total) by mouth daily. 90 tablet 3   NONFORMULARY OR COMPOUNDED ITEM Take 1 tablet by mouth daily as needed. CBD gummie as needed     ondansetron (ZOFRAN) 8 MG tablet Take 1 tablet (8 mg total) by mouth as directed. Take one tablet 30 minutes prior to each Temodar dose and every 8 hours as needed for nausea 30 tablet 1   sildenafil (VIAGRA) 100 MG tablet TAKE ONE HALF TO ONE TABLET BY MOUTH DAILY AS NEEDED FOR FOR ERECTILE DYSFUNCTION 5 tablet 10   simvastatin (ZOCOR) 20 MG tablet TAKE ONE TABLET BY MOUTH DAILY AT 8PM 90 tablet 3   tamsulosin (FLOMAX) 0.4 MG CAPS capsule Take 1 capsule (0.4 mg total) by mouth daily. 90 capsule 3   No current facility-administered medications for this visit.   Facility-Administered Medications Ordered in Other Visits  Medication Dose Route Frequency Provider Last Rate Last Admin   heparin lock flush 100 unit/mL  500 Units Intravenous Once Ladell Pier, MD       octreotide (SANDOSTATIN LAR) 30 MG IM injection             Allergies:   Penicillins    Social History:  The patient  reports that he has never smoked. He has never used smokeless tobacco. He reports current alcohol use. He reports that he does not use drugs.   Family History:  The patient's family history includes Cancer in his maternal aunt and paternal grandfather; Dementia in his mother; Emphysema in his maternal aunt.    ROS:  Please see the history of present illness.   Physical Exam: Blood pressure 124/78, pulse 86, height '5\' 10"'$  (1.778 m), weight  157 lb 9.6 oz (71.5 kg), SpO2 96 %.       GEN:  thin, middle age male,   in no acute distress HEENT: Normal NECK: No JVD; No carotid bruits LYMPHATICS: No lymphadenopathy CARDIAC: RRR , soft diastolic murmur  RESPIRATORY:  Clear to auscultation without rales, wheezing or rhonchi  ABDOMEN: Soft, non-tender, non-distended MUSCULOSKELETAL:  No edema; No deformity  SKIN: Warm and dry NEUROLOGIC:  Alert and oriented x 3    EKG:     Recent Labs: 01/02/2022: Magnesium 1.9 05/09/2022: TSH 2.70 05/23/2022: ALT 27; BUN 25; Creatinine 1.19; Hemoglobin 13.1; Platelet Count 117; Potassium 4.3; Sodium 135    Lipid Panel    Component Value Date/Time   CHOL 135 05/09/2022 0857   TRIG 71.0 05/09/2022 0857   HDL 40.60 05/09/2022 0857   CHOLHDL 3 05/09/2022 0857   VLDL 14.2 05/09/2022 0857   LDLCALC 80 05/09/2022 0857   LDLDIRECT 139.2 10/10/2008 0800      Wt Readings from Last 3 Encounters:  06/18/22 157 lb 9.6 oz (71.5 kg)  05/23/22 159 lb 9.6 oz (72.4 kg)  05/09/22 161 lb (73 kg)      Other studies Reviewed: Additional studies/ records that were reviewed today include: . Review of the above records demonstrates:    ASSESSMENT AND PLAN:  1.  Aortic stenosis:   s/p TAVR.   He has mild aortic insufficiency by exam.  Overall his TAVR seems to be functioning very well.    2.  Hypertension:   Blood pressures well controlled.    3.  Pulmonic insufficiency:  stable     Current medicines are reviewed at length with the patient today.  The patient does not have concerns  regarding medicines.  The following changes have been made:  no change  Labs/ tests ordered today include:   No orders of the defined types were placed in this encounter.    Mertie Moores, MD  06/18/2022 11:28 AM    [facility] [address], [place], [place]  [address] Phone: [phone]; Fax: [phone]

## 2022-06-18 ENCOUNTER — Ambulatory Visit: Payer: Medicare Other | Attending: Cardiovascular Disease | Admitting: Cardiovascular Disease

## 2022-06-18 ENCOUNTER — Encounter: Payer: Self-pay | Admitting: Cardiovascular Disease

## 2022-06-18 VITALS — BP 124/78 | HR 86 | Ht 70.0 in | Wt 157.6 lb

## 2022-06-18 DIAGNOSIS — Z952 Presence of prosthetic heart valve: Secondary | ICD-10-CM

## 2022-06-18 NOTE — Patient Instructions (Signed)
Medication Instructions:  Your physician recommends that you continue on your current medications as directed. Please refer to the Current Medication list given to you today.  *If you need a refill on your cardiac medications before your next appointment, please call your pharmacy*   Lab Work: NONE If you have labs (blood work) drawn today and your tests are completely normal, you will receive your results only by: MyChart Message (if you have MyChart) OR A paper copy in the mail If you have any lab test that is abnormal or we need to change your treatment, we will call you to review the results.   Testing/Procedures: NONE   Follow-Up: At [facility], you and your health needs are our priority.  As part of our continuing mission to provide you with exceptional heart care, we have created designated Provider Care Teams.  These Care Teams include your primary Cardiologist (physician) and Advanced Practice Providers (APPs -  Physician Assistants and Nurse Practitioners) who all work together to provide you with the care you need, when you need it.  Your next appointment:   1 year(s)  The format for your next appointment:   In Person  Provider:   Philip Nahser, MD    Important Information About Sugar       

## 2022-06-20 ENCOUNTER — Inpatient Hospital Stay (HOSPITAL_BASED_OUTPATIENT_CLINIC_OR_DEPARTMENT_OTHER): Payer: Medicare Other | Admitting: Nurse Practitioner

## 2022-06-20 ENCOUNTER — Inpatient Hospital Stay: Payer: Medicare Other

## 2022-06-20 ENCOUNTER — Inpatient Hospital Stay: Payer: Medicare Other | Attending: Oncology

## 2022-06-20 ENCOUNTER — Encounter: Payer: Self-pay | Admitting: Nurse Practitioner

## 2022-06-20 VITALS — BP 139/84 | HR 86 | Temp 98.1°F | Resp 20 | Ht 70.0 in | Wt 156.8 lb

## 2022-06-20 DIAGNOSIS — D3A8 Other benign neuroendocrine tumors: Secondary | ICD-10-CM

## 2022-06-20 DIAGNOSIS — C7B8 Other secondary neuroendocrine tumors: Secondary | ICD-10-CM | POA: Insufficient documentation

## 2022-06-20 DIAGNOSIS — C7A8 Other malignant neuroendocrine tumors: Secondary | ICD-10-CM | POA: Diagnosis not present

## 2022-06-20 LAB — CMP (CANCER CENTER ONLY)
ALT: 25 U/L (ref 0–44)
AST: 36 U/L (ref 15–41)
Albumin: 4.3 g/dL (ref 3.5–5.0)
Alkaline Phosphatase: 101 U/L (ref 38–126)
Anion gap: 9 (ref 5–15)
BUN: 23 mg/dL (ref 8–23)
CO2: 31 mmol/L (ref 22–32)
Calcium: 9.5 mg/dL (ref 8.9–10.3)
Chloride: 100 mmol/L (ref 98–111)
Creatinine: 1.14 mg/dL (ref 0.61–1.24)
GFR, Estimated: >60 mL/min (ref >60)
Glucose, Bld: 99 mg/dL (ref 70–99)
Potassium: 4.3 mmol/L (ref 3.5–5.1)
Sodium: 140 mmol/L (ref 135–145)
Total Bilirubin: 1.7 mg/dL — ABNORMAL HIGH (ref 0.3–1.2)
Total Protein: 7 g/dL (ref 6.5–8.1)

## 2022-06-20 LAB — CBC WITH DIFFERENTIAL (CANCER CENTER ONLY)
Abs Immature Granulocytes: 0.01 10*3/uL (ref 0.00–0.07)
Basophils Absolute: 0 10*3/uL (ref 0.0–0.1)
Basophils Relative: 1 %
Eosinophils Absolute: 0.1 10*3/uL (ref 0.0–0.5)
Eosinophils Relative: 3 %
HCT: 38.3 % — ABNORMAL LOW (ref 39.0–52.0)
Hemoglobin: 12.8 g/dL — ABNORMAL LOW (ref 13.0–17.0)
Immature Granulocytes: 0 %
Lymphocytes Relative: 10 %
Lymphs Abs: 0.4 10*3/uL — ABNORMAL LOW (ref 0.7–4.0)
MCH: 34.6 pg — ABNORMAL HIGH (ref 26.0–34.0)
MCHC: 33.4 g/dL (ref 30.0–36.0)
MCV: 103.5 fL — ABNORMAL HIGH (ref 80.0–100.0)
Monocytes Absolute: 0.6 10*3/uL (ref 0.1–1.0)
Monocytes Relative: 15 %
Neutro Abs: 2.9 10*3/uL (ref 1.7–7.7)
Neutrophils Relative %: 71 %
Platelet Count: 109 10*3/uL — ABNORMAL LOW (ref 150–400)
RBC: 3.7 MIL/uL — ABNORMAL LOW (ref 4.22–5.81)
RDW: 14.5 % (ref 11.5–15.5)
WBC Count: 4.1 10*3/uL (ref 4.0–10.5)
nRBC: 0 % (ref 0.0–0.2)

## 2022-06-20 LAB — TOTAL PROTEIN, URINE DIPSTICK: Protein, ur: 30 mg/dL — AB

## 2022-06-20 MED ORDER — OCTREOTIDE ACETATE 30 MG IM KIT
30.0000 mg | PACK | Freq: Once | INTRAMUSCULAR | Status: AC
  Administered 2022-06-20: 30 mg via INTRAMUSCULAR

## 2022-06-20 NOTE — Patient Instructions (Signed)
Octreotide Delayed-Release Capsules What is this medication? OCTREOTIDE (ok TREE oh tide) treats high levels of growth hormone (acromegaly). It works by reducing the amount of growth hormone your body makes. This reduces symptoms and the risk of health problems caused by too much growth hormone, such as diabetes and heart disease. This medicine may be used for other purposes; ask your health care provider or pharmacist if you have questions. COMMON BRAND NAME(S): Mycapssa What should I tell my care team before I take this medication? They need to know if you have any of these conditions: Diabetes Gallbladder disease Kidney disease Liver disease Thyroid disease An unusual or allergic reaction to octreotide, other medications, foods, dyes, or preservatives Pregnant or trying to get pregnant Breast-feeding How should I use this medication? Take this medication by mouth with water. Take it as directed on the prescription label at the same time every day. Do not cut, crush, or chew this medication. Swallow the capsules whole. Take it on an empty stomach, at least 1 hour before or 2 hours after food. Keep taking it unless your care team tells you to stop. Talk to your care team about the use of this medication in children. Special care may be needed. Overdosage: If you think you have taken too much of this medicine contact a poison control center or emergency room at once. NOTE: This medicine is only for you. Do not share this medicine with others. What if I miss a dose? If you miss a dose, take it as soon as you can. If it is almost time for your next dose, take only that dose. Do not take double or extra doses. What may interact with this medication? This medication may also interact with the following: Antacids Bromocriptine Certain medications for blood pressure, heart disease, irregular heartbeat Cyclosporine Diuretics Estrogen or progestin hormones Medications for diabetes, including  insulin Stomach acid blockers, such as cimetidine, famotidine, nizatidine, lansoprazole, esomeprazole, omeprazole, pantoprazole, rabeprazole This list may not describe all possible interactions. Give your health care provider a list of all the medicines, herbs, non-prescription drugs, or dietary supplements you use. Also tell them if you smoke, drink alcohol, or use illegal drugs. Some items may interact with your medicine. What should I watch for while using this medication? Visit your care team for regular checks on your progress. Tell your care team if your symptoms do not start to get better or if they get worse. This medication may cause decreases in blood sugar. Signs of low blood sugar include chills, cool, pale skin or cold sweats, drowsiness, extreme hunger, fast heartbeat, headache, nausea, nervousness or anxiety, shakiness, trembling, unsteadiness, tiredness, or weakness. Contact your care team right away if you experience any of these symptoms. This medication may increase blood sugar. The risk may be higher in patients who already have diabetes. Ask your care team what you can do to lower your risk of diabetes while taking this medication. You should make sure you get enough vitamin B12 while you are taking this medication. Discuss the foods you eat and the vitamins you take with your care team. Estrogen and/or progestin hormones may not work as well while you are taking this medication. If you are using these hormones for contraception, talk to your care team about using a second type of contraception. A barrier contraceptive, such as a condom or diaphragm, is recommended. What side effects may I notice from receiving this medication? Side effects that you should report to your care team as soon  as possible: Allergic reactions--skin rash, itching, hives, swelling of the face, lips, tongue, or throat Gallbladder problems--severe stomach pain, nausea, vomiting, fever Heart rhythm  changes--fast or irregular heartbeat, dizziness, feeling faint or lightheaded, chest pain, trouble breathing High blood sugar (hyperglycemia)--increased thirst or amount of urine, unusual weakness or fatigue, blurry vision Low blood sugar (hypoglycemia)--tremors or shaking, anxiety, sweating, cold or clammy skin, confusion, dizziness, rapid heartbeat Low thyroid levels (hypothyroidism)--unusual weakness or fatigue, increased sensitivity to cold, constipation, hair loss, dry skin, weight gain, feelings of depression Low vitamin B12 level--pain, tingling, or numbness in the hands or feet, muscle weakness, dizziness, confusion, trouble concentrating Pancreatitis--severe stomach pain that spreads to your back or gets worse after eating or when touched, fever, nausea, vomiting Side effects that usually do not require medical attention (report to your care team if they continue or are bothersome): Diarrhea Excessive sweating Joint pain Headache Nausea Stomach pain Swelling of the ankles, hands, or feet This list may not describe all possible side effects. Call your doctor for medical advice about side effects. You may report side effects to FDA at 1-800-FDA-1088. Where should I keep my medication? Keep out of the reach of children and pets. Store unopened medication wallets in the refrigerator. Do not freeze. Get rid of any unused medication after the expiration date. After the first use, store the opened wallet at room temperature between 20 and 25 degrees C (68 and 77 degrees F) for up to 1 month. Get rid of it after 1 month, even if it still contains capsules. NOTE: This sheet is a summary. It may not cover all possible information. If you have questions about this medicine, talk to your doctor, pharmacist, or health care provider.  2023 Elsevier/Gold Standard (2021-12-13 00:00:00)

## 2022-06-20 NOTE — Progress Notes (Signed)
[facility] OFFICE PROGRESS NOTE   Diagnosis: Pancreas neuroendocrine tumor  INTERVAL HISTORY:   Ronald Thompson returns as scheduled.  He continues cabozantinib.  He continues monthly Sandostatin.  Diarrhea is controlled with Imodium.  No nausea or vomiting.  No mouth sores.  He denies pain.  No flushing episodes.  Main complaint is a lack of energy.  Objective:  Vital signs in last 24 hours:  Blood pressure 139/84, pulse 86, temperature 98.1 F (36.7 C), temperature source Oral, resp. rate 20, height '5\' 10"'$  (1.778 m), weight 156 lb 12.8 oz (71.1 kg), SpO2 98 %.    HEENT: No thrush or ulcers. Resp: Lungs are clear.  Breath sounds diminished left lower lung field.  No respiratory distress. Cardio: Regular rate and rhythm. GI: Liver is palpable throughout the upper abdomen extending to below the umbilicus and to the left of midline. Vascular: No leg edema. Skin: No rash.  Alert and oriented.   Lab Results:  Lab Results  Component Value Date   WBC 4.1 06/20/2022   HGB 12.8 (L) 06/20/2022   HCT 38.3 (L) 06/20/2022   MCV 103.5 (H) 06/20/2022   PLT 109 (L) 06/20/2022   NEUTROABS 2.9 06/20/2022    Imaging:  No results found.  Medications: I have reviewed the patient's current medications.  Assessment/Plan: Pancreatic neuroendocrine tumor, WHO grade 2, pancreatic head mass and small peripancreatic/celiac nodes on a CT 02/04/2014 EUS revealed evidence of multiple liver metastases-status post an FNA biopsy of a left liver lesion 02/10/2014 confirming a neuroendocrine tumor   Octreotide scan 04/01/2014 with multiple foci of metastatic neuroendocrine tumor in the liver Monthly Sandostatin started 03/16/2014 Restaging CT 07/04/2014 with resolution of a previously noted pancreas head lesion and probable progression of liver metastases Restaging CT 10/31/2014-stable liver lesions, no evidence of a pancreas mass, stable. Restaging CT 02/28/2015-stable hepatic metastases,  stable pancreas lesion unchanged Restaging CT 08/30/2015-stable pancreas mass, slight enlargement of hepatic metastases Monthly Sandostatin continued Restaging CTs 01/17/2016 revealed enlargement of liver lesions, no new lesions, enlargement of the pancreas head mass Gallium DOTATATE scan 10/01/2016 confirmed uptake in the liver metastases, pancreas primary, peripancreatic adenopathy, and left iliac bone Cycle 1 Lutathera 03/05/2017 Cycle 2 Lutathera 04/30/2017 Cycle 3 Lutathera 07/02/2017 Cycle 4 Lutathera 08/27/2017 Netspot scan 09/29/2017-decreased radiotracer activity within the pancreas head, peripancreatic lymph node, and solitary skeletal lesion.  Liver lesions have increased in size with a decrease in radiotracer activity Monthly Sandostatin continued Netspot scan 05/20/2018-stable to decreased size of liver lesions, most have decreased SUV, similar uptake in the pancreas lesion, previously noted peripancreatic lymph node has resolved him a mild decrease in uptake associated with a left iliac metastasis, no evidence of disease progression Monthly Sandostatin continued Netspot scan 01/29/2019- overall stable to improved, 1 lesion left hepatic lobe has increased tracer activity-unchanged in size, majority of hepatic lesions have reduced activity compared to the pretreatment scan, decreased activity left iliac bone lesion, stable activity in the head of the pancreas lesion, no new lesions Monthly Sandostatin continued Netspot on September 24, 2019-increase in size of 3 liver lesions with associated stable radiotracer activity, no new lesions, stable small left iliac lesion Cycle 1 salvage Lutathera 10/27/2019 Cycle 2 salvage Lutathera 12/22/2019 Netspot on 02/15/2020-decrease in radiotracer activity in the liver metastases with a decrease in size of several lesions, no new lesions, no progressive disease in the bowel or mesentery, decreased activity in the left iliac bone lesion Monthly Sandostatin  continued  Netspot 10/13/2020-decrease in radiotracer activity in the  left and right liver, decreased size of liver lesions, one lesion in the superior right liver has increased in size and has no radiotracer activity, no evidence of metastatic disease outside the liver PET 11/15/2020- moderate hypermetabolic activity associated with several enlarging liver lesions that had limited activity on the dotatate PET, a lesion in the left lateral hepatic lobe with mild persistent dotatate activity has mild peripheral FDG activity and is decreased in size from a dotatate PET 1 year ago.  No hypermetabolic activity in the pancreas or bowel.  No hypermetabolic activity in the previous dotatate avid left iliac lesion Monthly Sandostatin continued Cycle 1 capecitabine/temozolomide 01/22/2021 (capecitabine twice daily days 1 through 14 of each 28-day cycle/temozolomide days 10 through 14) Cycle 2 capecitabine/temozolomide 02/19/2021 Cycle 3 capecitabine/temozolomide 03/19/2021 Cycle 4 capecitabine/temozolomide 04/17/2021 PET 05/11/2021-slight increase in size of several FDG avid liver lesions, no new hepatic lesions, no evidence of metastatic disease to the chest or bones Cycle 5 capecitabine/temozolomide 05/16/2021 Cycle 6 capecitabine/temozolomide 06/25/2021, capecitabine dose reduced Cycle 7 capecitabine/temozolomide 07/23/2021 Cycle 8 capecitabine/temozolomide 08/20/2021 PET 09/13/2021-mildly progressive hepatic metastases Everolimus 10/11/2021-discontinued after 2 weeks secondary to cost Cabozantinib 12/13/2021 Cabozantinib discontinued 12/24/2021 Cabozantinib resumed at a reduced dose of 20 mg daily beginning 01/12/2022 PET 03/28/2022-multifocal tracer avid liver metastases, mild increase in size and able to mild increase in tracer uptake, no extrahepatic disease identified Cabozantinib continued at a dose of 20 mg daily    Chest Seminoma in 1990 treated with BEP and chest radiation at [facility] 3. 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 chronic exertional dyspnea following treatment for the seminoma   5. aortic stenosis on an echocardiogram December 2011, severe aortic stenosis on echocardiogram 12/06/2015, status post TAVR on 04/16/2016 6. lumbar disc surgery 2014   7. acute abdominal pain 02/04/2014-resolved   8.  Diabetes 9.  COVID-19 infection July 2022    Disposition: Ronald Thompson appears stable.  He will continue cabozantinib and monthly Sandostatin.  We will follow-up on the chromogranin A level from today.  He will have a restaging PET scan prior to his next office visit.  We again reviewed the plan for salvage FOLFOX chemotherapy with disease progression.  CBC and chemistry panel reviewed.  Labs adequate to proceed as above.  He will return for a follow-up visit in 4 weeks.    Ned Card ANP/GNP-BC   06/20/2022  8:53 AM

## 2022-06-21 LAB — CHROMOGRANIN A: Chromogranin A (ng/mL): 39420 ng/mL — ABNORMAL HIGH (ref 0.0–101.8)

## 2022-06-30 ENCOUNTER — Other Ambulatory Visit: Payer: Self-pay | Admitting: Internal Medicine

## 2022-07-08 DIAGNOSIS — Z23 Encounter for immunization: Secondary | ICD-10-CM | POA: Diagnosis not present

## 2022-07-10 ENCOUNTER — Other Ambulatory Visit: Payer: Self-pay | Admitting: *Deleted

## 2022-07-10 DIAGNOSIS — D3A8 Other benign neuroendocrine tumors: Secondary | ICD-10-CM

## 2022-07-10 MED ORDER — CABOZANTINIB S-MALATE 20 MG PO TABS: 20.0000 mg | ORAL_TABLET | Freq: Every day | ORAL | 2 refills | Status: DC

## 2022-07-10 NOTE — Telephone Encounter (Signed)
Mr. Plouff called needing refill on his cabozantinib. [facility] report they have tried to reach office. Left VM for him that it will be refilled now and we have no record of pharmacy reaching out to [place].

## 2022-07-15 ENCOUNTER — Encounter (HOSPITAL_COMMUNITY)
Admission: RE | Admit: 2022-07-15 | Discharge: 2022-07-15 | Disposition: A | Discharge status: Home / Self Care | Payer: Medicare Other | Source: Ambulatory Visit | Attending: Nurse Practitioner | Admitting: Nurse Practitioner

## 2022-07-15 DIAGNOSIS — I700 Atherosclerosis of aorta: Secondary | ICD-10-CM | POA: Diagnosis not present

## 2022-07-15 DIAGNOSIS — K769 Liver disease, unspecified: Secondary | ICD-10-CM | POA: Diagnosis not present

## 2022-07-15 DIAGNOSIS — R160 Hepatomegaly, not elsewhere classified: Secondary | ICD-10-CM | POA: Insufficient documentation

## 2022-07-15 DIAGNOSIS — I2510 Atherosclerotic heart disease of native coronary artery without angina pectoris: Secondary | ICD-10-CM | POA: Insufficient documentation

## 2022-07-15 DIAGNOSIS — C7A8 Other malignant neuroendocrine tumors: Secondary | ICD-10-CM | POA: Diagnosis not present

## 2022-07-15 DIAGNOSIS — D3A8 Other benign neuroendocrine tumors: Secondary | ICD-10-CM | POA: Insufficient documentation

## 2022-07-15 DIAGNOSIS — C787 Secondary malignant neoplasm of liver and intrahepatic bile duct: Secondary | ICD-10-CM | POA: Diagnosis not present

## 2022-07-15 LAB — GLUCOSE, CAPILLARY: Glucose-Capillary: 106 mg/dL — ABNORMAL HIGH (ref 70–99)

## 2022-07-15 MED ORDER — FLUDEOXYGLUCOSE F - 18 (FDG) INJECTION
7.7000 | Freq: Once | INTRAVENOUS | Status: AC
  Administered 2022-07-15: 7.79 via INTRAVENOUS

## 2022-07-18 ENCOUNTER — Inpatient Hospital Stay: Payer: Medicare Other

## 2022-07-18 ENCOUNTER — Inpatient Hospital Stay: Payer: Medicare Other | Attending: Oncology

## 2022-07-18 ENCOUNTER — Inpatient Hospital Stay (HOSPITAL_BASED_OUTPATIENT_CLINIC_OR_DEPARTMENT_OTHER): Payer: Medicare Other | Admitting: Oncology

## 2022-07-18 VITALS — BP 116/67 | HR 85 | Temp 98.1°F | Resp 20 | Ht 70.0 in | Wt 159.0 lb

## 2022-07-18 DIAGNOSIS — D3A8 Other benign neuroendocrine tumors: Secondary | ICD-10-CM

## 2022-07-18 DIAGNOSIS — C7B8 Other secondary neuroendocrine tumors: Secondary | ICD-10-CM | POA: Insufficient documentation

## 2022-07-18 DIAGNOSIS — C7A8 Other malignant neuroendocrine tumors: Secondary | ICD-10-CM | POA: Insufficient documentation

## 2022-07-18 DIAGNOSIS — R978 Other abnormal tumor markers: Secondary | ICD-10-CM | POA: Insufficient documentation

## 2022-07-18 LAB — CBC WITH DIFFERENTIAL (CANCER CENTER ONLY)
Abs Immature Granulocytes: 0.01 10*3/uL (ref 0.00–0.07)
Basophils Absolute: 0 10*3/uL (ref 0.0–0.1)
Basophils Relative: 1 %
Eosinophils Absolute: 0.1 10*3/uL (ref 0.0–0.5)
Eosinophils Relative: 3 %
HCT: 35.7 % — ABNORMAL LOW (ref 39.0–52.0)
Hemoglobin: 11.8 g/dL — ABNORMAL LOW (ref 13.0–17.0)
Immature Granulocytes: 0 %
Lymphocytes Relative: 8 %
Lymphs Abs: 0.3 10*3/uL — ABNORMAL LOW (ref 0.7–4.0)
MCH: 34.1 pg — ABNORMAL HIGH (ref 26.0–34.0)
MCHC: 33.1 g/dL (ref 30.0–36.0)
MCV: 103.2 fL — ABNORMAL HIGH (ref 80.0–100.0)
Monocytes Absolute: 0.5 10*3/uL (ref 0.1–1.0)
Monocytes Relative: 13 %
Neutro Abs: 3.1 10*3/uL (ref 1.7–7.7)
Neutrophils Relative %: 75 %
Platelet Count: 131 10*3/uL — ABNORMAL LOW (ref 150–400)
RBC: 3.46 MIL/uL — ABNORMAL LOW (ref 4.22–5.81)
RDW: 13.9 % (ref 11.5–15.5)
WBC Count: 4.1 10*3/uL (ref 4.0–10.5)
nRBC: 0 % (ref 0.0–0.2)

## 2022-07-18 LAB — CMP (CANCER CENTER ONLY)
ALT: 22 U/L (ref 0–44)
AST: 38 U/L (ref 15–41)
Albumin: 3.9 g/dL (ref 3.5–5.0)
Alkaline Phosphatase: 100 U/L (ref 38–126)
Anion gap: 9 (ref 5–15)
BUN: 21 mg/dL (ref 8–23)
CO2: 28 mmol/L (ref 22–32)
Calcium: 9.2 mg/dL (ref 8.9–10.3)
Chloride: 101 mmol/L (ref 98–111)
Creatinine: 1.09 mg/dL (ref 0.61–1.24)
GFR, Estimated: >60 mL/min (ref >60)
Glucose, Bld: 103 mg/dL — ABNORMAL HIGH (ref 70–99)
Potassium: 4.4 mmol/L (ref 3.5–5.1)
Sodium: 138 mmol/L (ref 135–145)
Total Bilirubin: 1.3 mg/dL — ABNORMAL HIGH (ref 0.3–1.2)
Total Protein: 6.5 g/dL (ref 6.5–8.1)

## 2022-07-18 LAB — TOTAL PROTEIN, URINE DIPSTICK: Protein, ur: 30 mg/dL — AB

## 2022-07-18 MED ORDER — OCTREOTIDE ACETATE 30 MG IM KIT
30.0000 mg | PACK | Freq: Once | INTRAMUSCULAR | Status: AC
  Administered 2022-07-18: 30 mg via INTRAMUSCULAR
  Filled 2022-07-18: qty 1

## 2022-07-18 NOTE — Progress Notes (Signed)
[place] OFFICE PROGRESS NOTE   Diagnosis: Neuroendocrine tumor  INTERVAL HISTORY:   Ronald Thompson returns as scheduled.  He continues to have diarrhea.  He reports a good appetite.  He has increased abdominal distention and discomfort.  He is not taking pain medication.  Objective:  Vital signs in last 24 hours:  Blood pressure 116/67, pulse 85, temperature 98.1 F (36.7 C), temperature source Oral, resp. rate (!) 85, height '5\' 10"'$  (1.778 m), weight 159 lb (72.1 kg), SpO2 100 %.    HEENT: No thrush or ulcers Resp: Lungs clear bilaterally Cardio: Regular rate and rhythm GI: The liver is palpable throughout the right and left upper abdomen Vascular: No leg edema     Lab Results:  Lab Results  Component Value Date   WBC 4.1 07/18/2022   HGB 11.8 (L) 07/18/2022   HCT 35.7 (L) 07/18/2022   MCV 103.2 (H) 07/18/2022   PLT 131 (L) 07/18/2022   NEUTROABS 3.1 07/18/2022    CMP  Lab Results  Component Value Date   NA 140 06/20/2022   K 4.3 06/20/2022   CL 100 06/20/2022   CO2 31 06/20/2022   GLUCOSE 99 06/20/2022   BUN 23 06/20/2022   CREATININE 1.14 06/20/2022   CALCIUM 9.5 06/20/2022   PROT 7.0 06/20/2022   ALBUMIN 4.3 06/20/2022   AST 36 06/20/2022   ALT 25 06/20/2022   ALKPHOS 101 06/20/2022   BILITOT 1.7 (H) 06/20/2022   GFRNONAA >60 06/20/2022   GFRAA >60 01/19/2020    No results found for: "CEA1", "CEA", "GEX528", "CA125"  Lab Results  Component Value Date   INR 1.0 10/16/2021   LABPROT 13.2 10/16/2021    Imaging:  NM PET Image Restag (PS) Skull Base To Thigh  Result Date: 07/15/2022 CLINICAL DATA:  Subsequent treatment strategy for neuroendocrine tumor with liver metastases. EXAM: NUCLEAR MEDICINE PET SKULL BASE TO THIGH TECHNIQUE: 7.79 mCi F-18 FDG was injected intravenously. Full-ring PET imaging was performed from the skull base to thigh after the radiotracer. CT data was obtained and used for attenuation correction and anatomic  localization. Fasting blood glucose: 106 mg/dl COMPARISON:  March 28, 2022 FINDINGS: Mediastinal blood pool activity: SUV max 1.72 Liver activity: SUV max NA NECK: No hypermetabolic lymph nodes in the neck. Incidental CT findings: None. CHEST: No hypermetabolic mediastinal or hilar nodes. No suspicious pulmonary nodules on the CT scan. Incidental CT findings: Aortic atherosclerosis. Post aortic valve replacement. Mitral annular calcifications. Signs of coronary artery calcifications. No pericardial effusion. No adenopathy by size criteria in the chest. Mild pleural and parenchymal scarring in the LEFT upper lobe. No sign of consolidation, pleural effusion or suspicious nodule. ABDOMEN/PELVIS: Large hepatic masses with increased metabolic activity. (Image 106/4) 10 x 10 cm LEFT hepatic lobe mass previously 9.3 x 0.6 cm. Current maximum SUV of 7.46 as compared to 8.80. Large 15 cm RIGHT hepatic lobe mass previously approximately 14 cm greatest axial dimension with a maximum SUV currently of 7.26 as compared to 8.78. Inferior LEFT hepatic lobe mass extending from the medial segment of the LEFT hepatic lobe and also likely from the RIGHT hepatic lobe. Two masses in this location on the prior study, as measured as a single area on the current exam given that no discrete boundary exist between these 2 lesions currently there is a 15.8 cm maximal axial dimension as compared to 14 cm, maximum SUV is 5.68 as compared to 6.55. Central hepatic lesion (image 118/4) 3.5 cm as compared to  2.7 cm. Slight increase in size also of a lateral segment LEFT hepatic lobe mass (image 135/4 up to 11.1 cm as compared to 10.0 cm. This lesion arising from the inferior aspect of the lateral segment LEFT hepatic lobe is also similarly diminished in terms of maximum SUV with a maximum SUV of 6.48 as compared to 8.66. No signs of new adenopathy or solid organ disease in the abdomen or pelvis. Bowel is displaced inferiorly due to enlarging  hepatic masses. Incidental CT findings: Continued mass-effect, slightly increased upon adjacent structures secondary to enlarging hepatic masses. Increase in pelvic ascites now small to moderate volume No acute findings relative to pancreas, spleen, adrenal glands or kidneys. Urinary bladder is collapsed. Aortic atherosclerosis without aneurysm. No adenopathy by size criteria in the abdomen or in the pelvis. SKELETON: No focal hypermetabolic activity to suggest skeletal metastasis. Incidental CT findings: Unchanged small sclerotic foci in the LEFT pelvis without signs of increased metabolic activity. IMPRESSION: 1. Slight interval increase in size of hepatic masses with slight decrease in metabolic activity. Enlarging hepatic lesions appears to exert increased mass effect upon structures in the upper abdomen and mid abdomen. 2. No new sites of disease. 3. Slight increase in pelvic ascites now small to moderate volume. 4. Aortic atherosclerosis and coronary artery calcifications. Aortic Atherosclerosis (ICD10-I70.0). Electronically Signed   By: Zetta Bills M.D.   On: 07/15/2022 14:42    Medications: I have reviewed the patient's current medications.   Assessment/Plan:  Pancreatic neuroendocrine tumor, WHO grade 2, pancreatic head mass and small peripancreatic/celiac nodes on a CT 02/04/2014 EUS revealed evidence of multiple liver metastases-status post an FNA biopsy of a left liver lesion 02/10/2014 confirming a neuroendocrine tumor   Octreotide scan 04/01/2014 with multiple foci of metastatic neuroendocrine tumor in the liver Monthly Sandostatin started 03/16/2014 Restaging CT 07/04/2014 with resolution of a previously noted pancreas head lesion and probable progression of liver metastases Restaging CT 10/31/2014-stable liver lesions, no evidence of a pancreas mass, stable. Restaging CT 02/28/2015-stable hepatic metastases, stable pancreas lesion unchanged Restaging CT 08/30/2015-stable pancreas  mass, slight enlargement of hepatic metastases Monthly Sandostatin continued Restaging CTs 01/17/2016 revealed enlargement of liver lesions, no new lesions, enlargement of the pancreas head mass Gallium DOTATATE scan 10/01/2016 confirmed uptake in the liver metastases, pancreas primary, peripancreatic adenopathy, and left iliac bone Cycle 1 Lutathera 03/05/2017 Cycle 2 Lutathera 04/30/2017 Cycle 3 Lutathera 07/02/2017 Cycle 4 Lutathera 08/27/2017 Netspot scan 09/29/2017-decreased radiotracer activity within the pancreas head, peripancreatic lymph node, and solitary skeletal lesion.  Liver lesions have increased in size with a decrease in radiotracer activity Monthly Sandostatin continued Netspot scan 05/20/2018-stable to decreased size of liver lesions, most have decreased SUV, similar uptake in the pancreas lesion, previously noted peripancreatic lymph node has resolved him a mild decrease in uptake associated with a left iliac metastasis, no evidence of disease progression Monthly Sandostatin continued Netspot scan 01/29/2019- overall stable to improved, 1 lesion left hepatic lobe has increased tracer activity-unchanged in size, majority of hepatic lesions have reduced activity compared to the pretreatment scan, decreased activity left iliac bone lesion, stable activity in the head of the pancreas lesion, no new lesions Monthly Sandostatin continued Netspot on September 24, 2019-increase in size of 3 liver lesions with associated stable radiotracer activity, no new lesions, stable small left iliac lesion Cycle 1 salvage Lutathera 10/27/2019 Cycle 2 salvage Lutathera 12/22/2019 Netspot on 02/15/2020-decrease in radiotracer activity in the liver metastases with a decrease in size of several lesions, no new lesions, no  progressive disease in the bowel or mesentery, decreased activity in the left iliac bone lesion Monthly Sandostatin continued  Netspot 10/13/2020-decrease in radiotracer activity in the left  and right liver, decreased size of liver lesions, one lesion in the superior right liver has increased in size and has no radiotracer activity, no evidence of metastatic disease outside the liver PET 11/15/2020- moderate hypermetabolic activity associated with several enlarging liver lesions that had limited activity on the dotatate PET, a lesion in the left lateral hepatic lobe with mild persistent dotatate activity has mild peripheral FDG activity and is decreased in size from a dotatate PET 1 year ago.  No hypermetabolic activity in the pancreas or bowel.  No hypermetabolic activity in the previous dotatate avid left iliac lesion Monthly Sandostatin continued Cycle 1 capecitabine/temozolomide 01/22/2021 (capecitabine twice daily days 1 through 14 of each 28-day cycle/temozolomide days 10 through 14) Cycle 2 capecitabine/temozolomide 02/19/2021 Cycle 3 capecitabine/temozolomide 03/19/2021 Cycle 4 capecitabine/temozolomide 04/17/2021 PET 05/11/2021-slight increase in size of several FDG avid liver lesions, no new hepatic lesions, no evidence of metastatic disease to the chest or bones Cycle 5 capecitabine/temozolomide 05/16/2021 Cycle 6 capecitabine/temozolomide 06/25/2021, capecitabine dose reduced Cycle 7 capecitabine/temozolomide 07/23/2021 Cycle 8 capecitabine/temozolomide 08/20/2021 PET 09/13/2021-mildly progressive hepatic metastases Everolimus 10/11/2021-discontinued after 2 weeks secondary to cost Cabozantinib 12/13/2021 Cabozantinib discontinued 12/24/2021 Cabozantinib resumed at a reduced dose of 20 mg daily beginning 01/12/2022 PET 03/28/2022-multifocal tracer avid liver metastases, mild increase in size and able to mild increase in tracer uptake, no extrahepatic disease identified Cabozantinib continued at a dose of 20 mg daily PET 07/15/2022-slight interval increase in size of hepatic masses with slight decrease in metabolic activity slight increase in pelvic ascites, no new site of disease     Chest Seminoma in 1990 treated with BEP and chest radiation at [facility] 3. 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 chronic exertional dyspnea following treatment for the seminoma   5. aortic stenosis on an echocardiogram December 2011, severe aortic stenosis on echocardiogram 12/06/2015, status post TAVR on 04/16/2016 6. lumbar disc surgery 2014   7. acute abdominal pain 02/04/2014-resolved   8.  Diabetes 9.  COVID-19 infection July 2022    Disposition: Ronald Thompson has metastatic neuroendocrine tumor likely of pancreas origin.  He has clinical and radiologic evidence of disease progression.  The chromogranin A is elevated.  I reviewed the PET findings and images with him.  I recommend discontinuing cabozantinib.  I recommend a trial of FOLFOX chemotherapy.  We have previously discussed the FOLFOX regimen and associated toxicities.  We reviewed potential toxicities associated with this regimen again today.  He agrees to proceed.  He will be referred for Port-A-Cath placement.  The plan is to begin FOLFOX on 08/07/2021.  He will call for increased pain.  Betsy Coder, MD  07/18/2022  8:37 AM

## 2022-07-18 NOTE — Patient Instructions (Signed)
Octreotide Injection Solution What is this medication? OCTREOTIDE (ok TREE oh tide) treats high levels of growth hormone (acromegaly). It works by reducing the amount of growth hormone your body makes. This reduces symptoms and the risk of health problems caused by too much growth hormone, such as diabetes and heart disease. It may also be used to treat diarrhea caused by neuroendocrine tumors. It works by slowing down the release of serotonin from the tumor cells. This reduces the number of bowel movements you have. This medicine may be used for other purposes; ask your health care provider or pharmacist if you have questions. COMMON BRAND NAME(S): Bynfezia, Sandostatin What should I tell my care team before I take this medication? They need to know if you have any of these conditions: Diabetes Gallbladder disease Kidney disease Liver disease Thyroid disease An unusual or allergic reaction to octreotide, other medications, foods, dyes, or preservatives Pregnant or trying to get pregnant Breast-feeding How should I use this medication? This medication is injected under the skin or into a vein. It is usually given by your care team in a hospital or clinic setting. If you get this medication at home, you will be taught how to prepare and give it. Use exactly as directed. Take it as directed on the prescription label at the same time every day. Keep taking it unless your care team tells you to stop. Allow the injection solution to come to room temperature before use. Do not warm it artificially. It is important that you put your used needles and syringes in a special sharps container. Do not put them in a trash can. If you do not have a sharps container, call your pharmacist or care team to get one. Talk to your care team about the use of this medication in children. Special care may be needed. Overdosage: If you think you have taken too much of this medicine contact a poison control center or  emergency room at once. NOTE: This medicine is only for you. Do not share this medicine with others. What if I miss a dose? If you miss a dose, take it as soon as you can. If it is almost time for your next dose, take only that dose. Do not take double or extra doses. What may interact with this medication? Bromocriptine Certain medications for blood pressure, heart disease, irregular heartbeat Cyclosporine Diuretics Medications for diabetes, including insulin Quinidine This list may not describe all possible interactions. Give your health care provider a list of all the medicines, herbs, non-prescription drugs, or dietary supplements you use. Also tell them if you smoke, drink alcohol, or use illegal drugs. Some items may interact with your medicine. What should I watch for while using this medication? Visit your care team for regular checks on your progress. Tell your care team if your symptoms do not start to get better or if they get worse. To help reduce irritation at the injection site, use a different site for each injection and make sure the solution is at room temperature before use. This medication may cause decreases in blood sugar. Signs of low blood sugar include chills, cool, pale skin or cold sweats, drowsiness, extreme hunger, fast heartbeat, headache, nausea, nervousness or anxiety, shakiness, trembling, unsteadiness, tiredness, or weakness. Contact your care team right away if you experience any of these symptoms. This medication may increase blood sugar. The risk may be higher in patients who already have diabetes. Ask your care team what you can do to lower your   risk of diabetes while taking this medication. You should make sure you get enough vitamin B12 while you are taking this medication. Discuss the foods you eat and the vitamins you take with your care team. What side effects may I notice from receiving this medication? Side effects that you should report to your care  team as soon as possible: Allergic reactions--skin rash, itching, hives, swelling of the face, lips, tongue, or throat Gallbladder problems--severe stomach pain, nausea, vomiting, fever Heart rhythm changes--fast or irregular heartbeat, dizziness, feeling faint or lightheaded, chest pain, trouble breathing High blood sugar (hyperglycemia)--increased thirst or amount of urine, unusual weakness or fatigue, blurry vision Low blood sugar (hypoglycemia)--tremors or shaking, anxiety, sweating, cold or clammy skin, confusion, dizziness, rapid heartbeat Low thyroid levels (hypothyroidism)--unusual weakness or fatigue, increased sensitivity to cold, constipation, hair loss, dry skin, weight gain, feelings of depression Low vitamin B12 level--pain, tingling, or numbness in the hands or feet, muscle weakness, dizziness, confusion, trouble concentrating Pancreatitis--severe stomach pain that spreads to your back or gets worse after eating or when touched, fever, nausea, vomiting Side effects that usually do not require medical attention (report to your care team if they continue or are bothersome): Diarrhea Dizziness Gas Headache Pain, redness, or irritation at injection site Stomach pain This list may not describe all possible side effects. Call your doctor for medical advice about side effects. You may report side effects to FDA at 1-800-FDA-1088. Where should I keep my medication? Keep out of the reach of children and pets. Store in the refrigerator. Protect from light. Allow to come to room temperature naturally. Do not use artificial heat. If protected from light, the injection may be stored between 20 and 30 degrees C (70 and 86 degrees F) for 14 days. After the initial use, throw away any unused portion of a multiple dose vial after 14 days. Get rid of any unused portions of the ampules after use. To get rid of medications that are no longer needed or have expired: Take the medication to a medication  take-back program. Ask your pharmacy or law enforcement to find a location. If you cannot return the medication, ask your pharmacist or care team how to get rid of the medication safely. NOTE: This sheet is a summary. It may not cover all possible information. If you have questions about this medicine, talk to your doctor, pharmacist, or health care provider.  2023 Elsevier/Gold Standard (2021-10-24 00:00:00)  

## 2022-07-18 NOTE — Progress Notes (Signed)
OFF PATHWAY REGIMEN - Neuroendocrine  No Change  Continue With Treatment as Ordered.  Original Decision Date/Time: 11/01/2021 09:13   [id number] (Leucovorin IV D1 + Fluorouracil IV D1/CIV D1,2 + Oxaliplatin IV D1) q14 Days:   A cycle is every 14 days:     Oxaliplatin      Leucovorin      Fluorouracil      Fluorouracil   **Always confirm dose/schedule in your pharmacy ordering system**  Patient Characteristics: Pancreas, Poorly-differentiated, Second Line and Beyond Tumor Location: Pancreas AJCC M Category: Staged < 8th Ed. AJCC 8 Stage Grouping: Staged < 8th Ed. AJCC T Category: Staged < 8th Ed. AJCC N Category: Staged < 8th Ed. Line of therapy: Second Line and Beyond  Intent of Therapy: Non-Curative / Palliative Intent, Discussed with Patient

## 2022-07-19 ENCOUNTER — Other Ambulatory Visit: Payer: Self-pay

## 2022-07-19 LAB — CHROMOGRANIN A: Chromogranin A (ng/mL): 46900 ng/mL — ABNORMAL HIGH (ref 0.0–101.8)

## 2022-07-23 ENCOUNTER — Telehealth: Payer: Self-pay

## 2022-07-23 ENCOUNTER — Ambulatory Visit (INDEPENDENT_AMBULATORY_CARE_PROVIDER_SITE_OTHER): Payer: Medicare Other

## 2022-07-23 VITALS — Ht 70.0 in | Wt 159.0 lb

## 2022-07-23 DIAGNOSIS — Z0000 Encounter for general adult medical examination without abnormal findings: Secondary | ICD-10-CM

## 2022-07-23 NOTE — Patient Instructions (Addendum)
Ronald Thompson , Thank you for taking time to come for your Medicare Wellness Visit. I appreciate your ongoing commitment to your health goals. Please review the following plan we discussed and let me know if I can assist you in the future.   These are the goals we discussed:  Goals       Patient Stated (pt-stated)      My goal is to stay alive and to take one day at a time.        This is a list of the screening recommended for you and due dates:  Health Maintenance  Topic Date Due   DTaP/Tdap/Td vaccine (3 - Td or Tdap) 11/17/2021   Eye exam for diabetics  12/04/2021   COVID-19 Vaccine (4 - 2023-24 season) 04/05/2022   Yearly kidney health urinalysis for diabetes  08/08/2022   Complete foot exam   11/07/2022   Hemoglobin A1C  11/08/2022   Yearly kidney function blood test for diabetes  07/19/2023   Medicare Annual Wellness Visit  07/24/2023   Colon Cancer Screening  08/05/2024   Pneumonia Vaccine  Completed   Flu Shot  Completed   Hepatitis C Screening: USPSTF Recommendation to screen - Ages 18-79 yo.  Completed   Zoster (Shingles) Vaccine  Completed   HPV Vaccine  Aged Out    Advanced directives: Yes; Please bring a copy of your health care power of attorney and living will to the office at your convenience.  Conditions/risks identified: Yes  Next appointment: Follow up in one year for your annual wellness visit.   Preventive Care 47 Years and Older, Male  Preventive care refers to lifestyle choices and visits with your health care provider that can promote health and wellness. What does preventive care include? A yearly physical exam. This is also called an annual well check. Dental exams once or twice a year. Routine eye exams. Ask your health care provider how often you should have your eyes checked. Personal lifestyle choices, including: Daily care of your teeth and gums. Regular physical activity. Eating a healthy diet. Avoiding tobacco and drug use. Limiting  alcohol use. Practicing safe sex. Taking low doses of aspirin every day. Taking vitamin and mineral supplements as recommended by your health care provider. What happens during an annual well check? The services and screenings done by your health care provider during your annual well check will depend on your age, overall health, lifestyle risk factors, and family history of disease. Counseling  Your health care provider may ask you questions about your: Alcohol use. Tobacco use. Drug use. Emotional well-being. Home and relationship well-being. Sexual activity. Eating habits. History of falls. Memory and ability to understand (cognition). Work and work Statistician. Screening  You may have the following tests or measurements: Height, weight, and BMI. Blood pressure. Lipid and cholesterol levels. These may be checked every 5 years, or more frequently if you are over 32 years old. Skin check. Lung cancer screening. You may have this screening every year starting at age 43 if you have a 30-pack-year history of smoking and currently smoke or have quit within the past 15 years. Fecal occult blood test (FOBT) of the stool. You may have this test every year starting at age 84. Flexible sigmoidoscopy or colonoscopy. You may have a sigmoidoscopy every 5 years or a colonoscopy every 10 years starting at age 55. Prostate cancer screening. Recommendations will vary depending on your family history and other risks. Hepatitis C blood test. Hepatitis B blood test.  Sexually transmitted disease (STD) testing. Diabetes screening. This is done by checking your blood sugar (glucose) after you have not eaten for a while (fasting). You may have this done every 1-3 years. Abdominal aortic aneurysm (AAA) screening. You may need this if you are a current or former smoker. Osteoporosis. You may be screened starting at age 64 if you are at high risk. Talk with your health care provider about your test results,  treatment options, and if necessary, the need for more tests. Vaccines  Your health care provider may recommend certain vaccines, such as: Influenza vaccine. This is recommended every year. Tetanus, diphtheria, and acellular pertussis (Tdap, Td) vaccine. You may need a Td booster every 10 years. Zoster vaccine. You may need this after age 56. Pneumococcal 13-valent conjugate (PCV13) vaccine. One dose is recommended after age 28. Pneumococcal polysaccharide (PPSV23) vaccine. One dose is recommended after age 31. Talk to your health care provider about which screenings and vaccines you need and how often you need them. This information is not intended to replace advice given to you by your health care provider. Make sure you discuss any questions you have with your health care provider. Document Released: 08/18/2015 Document Revised: 04/10/2016 Document Reviewed: 05/23/2015 Elsevier Interactive Patient Education  2017 Comfort Prevention in the Home Falls can cause injuries. They can happen to people of all ages. There are many things you can do to make your home safe and to help prevent falls. What can I do on the outside of my home? Regularly fix the edges of walkways and driveways and fix any cracks. Remove anything that might make you trip as you walk through a door, such as a raised step or threshold. Trim any bushes or trees on the path to your home. Use bright outdoor lighting. Clear any walking paths of anything that might make someone trip, such as rocks or tools. Regularly check to see if handrails are loose or broken. Make sure that both sides of any steps have handrails. Any raised decks and porches should have guardrails on the edges. Have any leaves, snow, or ice cleared regularly. Use sand or salt on walking paths during winter. Clean up any spills in your garage right away. This includes oil or grease spills. What can I do in the bathroom? Use night  lights. Install grab bars by the toilet and in the tub and shower. Do not use towel bars as grab bars. Use non-skid mats or decals in the tub or shower. If you need to sit down in the shower, use a plastic, non-slip stool. Keep the floor dry. Clean up any water that spills on the floor as soon as it happens. Remove soap buildup in the tub or shower regularly. Attach bath mats securely with double-sided non-slip rug tape. Do not have throw rugs and other things on the floor that can make you trip. What can I do in the bedroom? Use night lights. Make sure that you have a light by your bed that is easy to reach. Do not use any sheets or blankets that are too big for your bed. They should not hang down onto the floor. Have a firm chair that has side arms. You can use this for support while you get dressed. Do not have throw rugs and other things on the floor that can make you trip. What can I do in the kitchen? Clean up any spills right away. Avoid walking on wet floors. Keep items that you  use a lot in easy-to-reach places. If you need to reach something above you, use a strong step stool that has a grab bar. Keep electrical cords out of the way. Do not use floor polish or wax that makes floors slippery. If you must use wax, use non-skid floor wax. Do not have throw rugs and other things on the floor that can make you trip. What can I do with my stairs? Do not leave any items on the stairs. Make sure that there are handrails on both sides of the stairs and use them. Fix handrails that are broken or loose. Make sure that handrails are as long as the stairways. Check any carpeting to make sure that it is firmly attached to the stairs. Fix any carpet that is loose or worn. Avoid having throw rugs at the top or bottom of the stairs. If you do have throw rugs, attach them to the floor with carpet tape. Make sure that you have a light switch at the top of the stairs and the bottom of the stairs. If  you do not have them, ask someone to add them for you. What else can I do to help prevent falls? Wear shoes that: Do not have high heels. Have rubber bottoms. Are comfortable and fit you well. Are closed at the toe. Do not wear sandals. If you use a stepladder: Make sure that it is fully opened. Do not climb a closed stepladder. Make sure that both sides of the stepladder are locked into place. Ask someone to hold it for you, if possible. Clearly mark and make sure that you can see: Any grab bars or handrails. First and last steps. Where the edge of each step is. Use tools that help you move around (mobility aids) if they are needed. These include: Canes. Walkers. Scooters. Crutches. Turn on the lights when you go into a dark area. Replace any light bulbs as soon as they burn out. Set up your furniture so you have a clear path. Avoid moving your furniture around. If any of your floors are uneven, fix them. If there are any pets around you, be aware of where they are. Review your medicines with your doctor. Some medicines can make you feel dizzy. This can increase your chance of falling. Ask your doctor what other things that you can do to help prevent falls. This information is not intended to replace advice given to you by your health care provider. Make sure you discuss any questions you have with your health care provider. Document Released: 05/18/2009 Document Revised: 12/28/2015 Document Reviewed: 08/26/2014 Elsevier Interactive Patient Education  2017 [employer].

## 2022-07-23 NOTE — Progress Notes (Signed)
Pharmacist Chemotherapy Monitoring - Initial Assessment    Anticipated start date: 08/07/22   The following has been reviewed per standard work regarding the patient's treatment regimen: The patient's diagnosis, treatment plan and drug doses, and organ/hematologic function Lab orders and baseline tests specific to treatment regimen  The treatment plan start date, drug sequencing, and pre-medications Prior authorization status  Patient's documented medication list, including drug-drug interaction screen and prescriptions for anti-emetics and supportive care specific to the treatment regimen The drug concentrations, fluid compatibility, administration routes, and timing of the medications to be used The patient's access for treatment and lifetime cumulative dose history, if applicable  The patient's medication allergies and previous infusion related reactions, if applicable   Changes made to treatment plan:  treatment plan date  Follow up needed:  N/A   Judge Stall, [facility], 07/23/2022  12:18 PM

## 2022-07-23 NOTE — Progress Notes (Signed)
Virtual Visit via Telephone Note  I connected with  Ronald Thompson on 07/23/22 at 10:30 AM EST by telephone and verified that I am speaking with the correct person using two identifiers.  Location: Patient: Home Provider: [place] Persons participating in the virtual visit: [place]   I discussed the limitations, risks, security and privacy concerns of performing an evaluation and management service by telephone and the availability of in person appointments. The patient expressed understanding and agreed to proceed.  Interactive audio and video telecommunications were attempted between this nurse and patient, however failed, due to patient having technical difficulties OR patient did not have access to video capability.  We continued and completed visit with audio only.  Some vital signs may be absent or patient reported.   Sheral Flow, LPN  Subjective:   Ronald Thompson is a 66 y.o. male who presents for Medicare Annual/Subsequent preventive examination.  Review of Systems     Cardiac Risk Factors include: advanced age (>71mn, >>13women);diabetes mellitus;dyslipidemia;hypertension;male gender     Objective:    Today's Vitals   07/23/22 1033 07/23/22 1047  Weight: 159 lb (72.1 kg)   Height: _0  (1.778 m)   PainSc:  0-No pain   Body mass index is 22.81 kg/m.     07/23/2022   10:34 AM 07/18/2022    8:34 AM 06/20/2022    8:45 AM 05/23/2022    8:56 AM 04/25/2022    8:35 AM 04/04/2022    8:03 AM 03/04/2022    8:26 AM  Advanced Directives  Does Patient Have a Medical Advance Directive? Yes Yes No Yes Yes No No  Type of [place]aramed[place]of [place]Living will [place]Living will  [place] will [place] will    Does patient want to make changes to medical advance directive?  No - Patient declined No - Patient declined No - Patient declined No - Patient  declined No - Patient declined No - Patient declined  Copy of HOmarin Chart? No - copy requested   No - copy requested     Would patient like information on creating a medical advance directive?   No - Patient declined        Current Medications (verified) Outpatient Encounter Medications as of 07/23/2022  Medication Sig   acetaminophen (TYLENOL) 500 MG tablet Take 1,000 mg by mouth every 6 (six) hours as needed for headache.   albuterol (VENTOLIN HFA) 108 (90 Base) MCG/ACT inhaler Inhale 1-2 puffs into the lungs every 6 (six) hours as needed for wheezing or shortness of breath. (Patient not taking: Reported on 07/18/2022)   cabozantinib (CABOMETYX) 20 MG tablet Take 1 tablet (20 mg total) by mouth daily. Take on an empty stomach, 1 hour before or 2 hours after meals.   calcium carbonate (TUMS - DOSED IN MG ELEMENTAL CALCIUM) 500 MG chewable tablet Chew 3 tablets by mouth 3 (three) times daily as needed for indigestion or heartburn.   Cholecalciferol (VITAMIN D-3) 125 MCG (5000 UT) TABS Take 1 tablet by mouth daily in the afternoon.   clindamycin (CLEOCIN) 300 MG capsule Take 2 capsules by mouth one hour prior to dental appointment (Patient not taking: Reported on 07/18/2022)   diphenhydrAMINE (BENADRYL) 50 MG capsule Take 50 mg by mouth at bedtime as needed for sleep.   docusate sodium (COLACE) 100 MG capsule Take 100 mg by mouth daily as needed for mild constipation. (Patient not  taking: Reported on 07/18/2022)   Lancet Device MISC Use as instructed to test blood sugar daily E11.65   Lancets (ONETOUCH DELICA PLUS [id number]) MISC TEST ONCE DAILY   loperamide (IMODIUM) 2 MG capsule Take 2 mg by mouth as needed for diarrhea or loose stools.   losartan (COZAAR) 100 MG tablet Take 1 tablet (100 mg total) by mouth daily.   NONFORMULARY OR COMPOUNDED ITEM Take 1 tablet by mouth daily as needed. CBD gummie as needed (Patient not taking: Reported on 07/18/2022)   ondansetron  (ZOFRAN) 8 MG tablet Take 1 tablet (8 mg total) by mouth as directed. Take one tablet 30 minutes prior to each Temodar dose and every 8 hours as needed for nausea (Patient not taking: Reported on 07/18/2022)   sildenafil (VIAGRA) 100 MG tablet TAKE ONE-HALF TO ONE TABLET BY MOUTH DAILY AS NEEDED FOR ERECTILE DYSFUNCTION   simvastatin (ZOCOR) 20 MG tablet TAKE ONE TABLET BY MOUTH DAILY AT 8PM   tamsulosin (FLOMAX) 0.4 MG CAPS capsule Take 1 capsule (0.4 mg total) by mouth daily.   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 25 yrs ago-during cancer tx.   Past Surgical History:  Procedure Laterality Date   BACK SURGERY     '14- rupt. disc    CARDIAC CATHETERIZATION N/A 01/24/2016   Procedure: Right/Left Heart Cath and Coronary Angiography;  Surgeon: [name], MD;  Location: [facility];   Service: Cardiovascular;  Laterality: N/A;   COLONOSCOPY     EUS N/A 02/10/2014   Procedure: UPPER ENDOSCOPIC ULTRASOUND (EUS) LINEAR;  Surgeon: [name], MD;  Location: [facility];  Service: Endoscopy;  Laterality: N/A;   IR RADIOLOGIST EVAL & MGMT  02/11/2017   KNEE SURGERY Right    age 13 for Baker's cyst   NASAL SEPTUM SURGERY     Deviated septrum   TEE WITHOUT CARDIOVERSION N/A 04/16/2016   Procedure: TRANSESOPHAGEAL ECHOCARDIOGRAM (TEE);  Surgeon: [name], MD;  Location: [facility];  Service: Open Heart Surgery;  Laterality: N/A;   THORACOTOMY  1990   wedge biopsy of mediastinal mass   TRANSCATHETER AORTIC VALVE REPLACEMENT, TRANSFEMORAL N/A 04/16/2016   Procedure: TRANSCATHETER AORTIC VALVE REPLACEMENT, TRANSFEMORAL;  Surgeon: [name], MD;  Location: [facility];  Service: Open Heart Surgery;  Laterality: N/A;  Family History  Problem Relation Age of Onset   Dementia Mother    Cancer Paternal Grandfather        esophagus   Cancer Maternal Aunt        colon   Emphysema Maternal Aunt    Social History   Socioeconomic History   Marital status: Married    Spouse name: Not on file   Number of children: 3   Years of education: 16   Highest education level: Not on file  Occupational History   Occupation: Administrator, sports: [employer]  Tobacco Use   Smoking status: Never   Smokeless tobacco: Never  Vaping Use   Vaping Use: Never used  Substance and Sexual Activity   Alcohol use: Yes    Comment: rare- social   Drug use: No   Sexual activity: Yes    Partners: Female  Other Topics Concern   Not on file  Social History Narrative   [name]. 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 2 sons: 1979, 1985- married with 2 daughters 1 in route: Oldest in [place], younger [place], [place]. 1 daughter: 5 [place], married-1 granddaughter.Customer service manager- Nurse, mental health. SO- good health; marriage in good health   Enjoys watching movies, works out at home gym   Social Determinants of Health    Financial Resource Strain: Low Risk  (07/23/2022)   Overall Financial Resource Strain (CARDIA)    Difficulty of Paying Living Expenses: Not hard at all  Food Insecurity: No Food Insecurity (07/23/2022)   Hunger Vital Sign    Worried About Running Out of Food in the Last Year: Never true    [place] in the Last Year: Never true  Transportation Needs: No Transportation Needs (07/23/2022)   PRAPARE - Hydrologist (Medical): No    Lack of Transportation (Non-Medical): No  Physical Activity: Inactive (07/23/2022)   Exercise Vital Sign    Days of Exercise per Week: 0 days    Minutes of Exercise per Session: 0 min  Stress: No Stress Concern Present (07/23/2022)   [place]    Feeling of Stress : Not at all  Social Connections: [place] (07/23/2022)   Social Connection and Isolation Panel [NHANES]    Frequency of Communication with Friends and Family: More than three times a week    Frequency of Social Gatherings with Friends and Family: More than three times a week    Attends Religious Services: 1 to 4 times per year    Active Member of [employer] or Organizations: Yes    Attends Archivist Meetings: 1 to 4 times per year    Marital Status: Married    Tobacco Counseling Counseling given: Not Answered   Clinical Intake:  Pre-visit preparation completed: Yes  Pain : No/denies pain Pain Score: 0-No pain     BMI - recorded: 22.81 Nutritional Status: BMI of 19-24  Normal Nutritional Risks: None Diabetes: Yes CBG done?: No Did pt. bring in CBG monitor from home?: No  How often do you need to have someone help you when you read instructions, pamphlets, or other written materials from your doctor or pharmacy?: 1 - Never What is the last grade level you completed in school?: HSG; Bachelor's Degree  Nutrition Risk Assessment:  Has the patient had any N/V/D  within the last 2 months?  No  Does the patient have any non-healing wounds?  No  Has the patient had any  unintentional weight loss or weight gain?  Yes   Diabetes:  Is the patient diabetic?  Yes  If diabetic, was a CBG obtained today?  No  Did the patient bring in their glucometer from home?  No  How often do you monitor your CBG's? Once daily.   Financial Strains and Diabetes Management:  Are you having any financial strains with the device, your supplies or your medication? No .  Does the patient want to be seen by Chronic Care Management for management of their diabetes?  No  Would the patient like to be referred to a Nutritionist or for Diabetic Management?  No   Diabetic Exams:  Diabetic Eye Exam: Overdue for diabetic eye exam. Pt has been advised about the importance in completing this exam. Patient advised to call and schedule an eye exam. Diabetic Foot Exam: Completed 11/06/2021   Interpreter Needed?: No  Information entered by :: Lisette Abu, LPN.   Activities of Daily Living    07/23/2022   10:42 AM  In your present state of health, do you have any difficulty performing the following activities:  Hearing? 0  Vision? 0  Difficulty concentrating or making decisions? 0  Walking or climbing stairs? 0  Dressing or bathing? 0  Doing errands, shopping? 0  Preparing Food and eating ? N  Using the Toilet? N  In the past six months, have you accidently leaked urine? N  Do you have problems with loss of bowel control? N  Managing your Medications? N  Managing your Finances? N  Housekeeping or managing your Housekeeping? N    Patient Care Team: [name], MD as PCP - General (Internal Medicine) Nahser, Wonda Cheng, MD as PCP - Cardiology (Cardiology) Ladell Pier, MD as Consulting Physician (Oncology) [name], MD as Consulting Physician (Cardiology) Deneise Lever, MD as Consulting Physician (Pulmonary Disease)  Indicate any recent Medical  Services you may have received from other than [facility] providers in the past year (date may be approximate).     Assessment:   This is a routine wellness examination for [place].  Hearing/Vision screen Hearing Screening - Comments:: Denies hearing difficulties.  Vision Screening - Comments:: Wears rx glasses - up to date with routine eye exams with Rutherford Guys, MD.   Dietary issues and exercise activities discussed: Current Exercise Habits: The patient does not participate in regular exercise at present, Exercise limited by: Other - see comments (terminal cancer)   Goals Addressed   None   Depression Screen    07/23/2022   10:38 AM 05/09/2022    8:10 AM 11/06/2021    8:40 AM 11/06/2021    8:19 AM 07/09/2021   10:50 AM 11/20/2020   10:38 AM 10/19/2020   11:36 AM  PHQ 2/9 Scores  PHQ - 2 Score 0 0 1 0 0 0 0    Fall Risk    07/23/2022   10:36 AM 05/09/2022    8:10 AM 11/06/2021    8:39 AM 11/06/2021    8:19 AM 07/09/2021   10:47 AM  Fall Risk   Falls in the past year? 0 0 0 0 0  Number falls in past yr: 0 0 0 0 0  Injury with Fall? 0 0 0 0 0  Risk for fall due to : No Fall Risks No Fall Risks   No Fall Risks  Follow up Falls prevention discussed Falls evaluation completed       FALL RISK PREVENTION PERTAINING TO THE HOME:  Any stairs in or around the home? Yes  If so, are there any without handrails? No  Home free of loose throw rugs in walkways, pet beds, electrical cords, etc? Yes  Adequate lighting in your home to reduce risk of falls? Yes   ASSISTIVE DEVICES UTILIZED TO PREVENT FALLS:  Life alert? Yes  Use of a cane, walker or w/c? No  Grab bars in the bathroom? Yes  Shower chair or bench in shower? No  Elevated toilet seat or a handicapped toilet? Yes   TIMED UP AND GO:  Was the test performed? No . Phone Visit  Cognitive Function:        07/23/2022   10:41 AM 01/27/2020    9:01 AM  6CIT Screen  What Year? 0 points 0 points  What month? 0 points 0 points   What time? 0 points 0 points  Count back from 20 0 points 0 points  Months in reverse 0 points 0 points  Repeat phrase 0 points 0 points  Total Score 0 points 0 points    Immunizations Immunization History  Administered Date(s) Administered   Fluad Quad(high Dose 65+) 06/09/2019, 06/20/2020, 05/09/2022   Influenza Whole 06/09/2010, 04/26/2012   Influenza,inj,Quad PF,6+ Mos 04/13/2014, 04/26/2016, 05/14/2017   Influenza-Unspecified 05/30/2015, 04/05/2018, 04/05/2021   PFIZER(Purple Top)SARS-COV-2 Vaccination 10/20/2019, 11/10/2019, 07/19/2020   Pneumococcal Conjugate-13 09/22/2019   Pneumococcal Polysaccharide-23 10/19/2020   Td 06/09/2010   Tdap 11/18/2011   Zoster Recombinat (Shingrix) 03/05/2018, 05/07/2018, 07/18/2018    TDAP status: Due, Education has been provided regarding the importance of this vaccine. Advised may receive this vaccine at local pharmacy or Health Dept. Aware to provide a copy of the vaccination record if obtained from local pharmacy or Health Dept. Verbalized acceptance and understanding.  Flu Vaccine status: Up to date  Pneumococcal vaccine status: Up to date  Covid-19 vaccine status: Completed vaccines  Qualifies for Shingles Vaccine? Yes   Zostavax completed No   Shingrix Completed?: Yes  Screening Tests Health Maintenance  Topic Date Due   DTaP/Tdap/Td (3 - Td or Tdap) 11/17/2021   OPHTHALMOLOGY EXAM  12/04/2021   COVID-19 Vaccine (4 - 2023-24 season) 04/05/2022   Diabetic kidney evaluation - Urine ACR  08/08/2022   FOOT EXAM  11/07/2022   HEMOGLOBIN A1C  11/08/2022   Diabetic kidney evaluation - eGFR measurement  07/19/2023   Medicare Annual Wellness (AWV)  07/24/2023   COLONOSCOPY (Pts 45-18yr Insurance coverage will need to be confirmed)  08/05/2024   Pneumonia Vaccine 66 Years old  Completed   INFLUENZA VACCINE  Completed   Hepatitis C Screening  Completed   Zoster Vaccines- Shingrix  Completed   HPV VACCINES  Aged Out    Health  Maintenance  Health Maintenance Due  Topic Date Due   DTaP/Tdap/Td (3 - Td or Tdap) 11/17/2021   OPHTHALMOLOGY EXAM  12/04/2021   COVID-19 Vaccine (4 - 2023-24 season) 04/05/2022   Diabetic kidney evaluation - Urine ACR  08/08/2022    Colorectal cancer screening: Type of screening: Colonoscopy. Completed 08/05/2014. Repeat every 10 years  Lung Cancer Screening: (Low Dose CT Chest recommended if Age 68-80years, 30 pack-year currently smoking OR have quit w/in 15years.) does not qualify.   Lung Cancer Screening Referral: no  Additional Screening:  Hepatitis C Screening: does qualify; Completed 11/08/2015  Vision Screening: Recommended annual ophthalmology exams for early detection of glaucoma and other disorders of the eye. Is the patient up to date with their annual eye exam?  Yes  Who is the provider or what is the name of the office in which the patient attends annual eye exams? [facility]; will be transferring to [place] Guys, [place] in 2024. If pt is not established with a provider, would they like to be referred to a provider to establish care? No .   Dental Screening: Recommended annual dental exams for proper oral hygiene  Community Resource Referral / Chronic Care Management: CRR required this visit?  No   CCM required this visit?  No      Plan:     I have personally reviewed and noted the following in the patient's chart:   Medical and social history Use of alcohol, tobacco or illicit drugs  Current medications and supplements including opioid prescriptions. Patient is not currently taking opioid prescriptions. Functional ability and status Nutritional status Physical activity Advanced directives List of other physicians Hospitalizations, surgeries, and ER visits in previous 12 months Vitals Screenings to include cognitive, depression, and falls Referrals and appointments  In addition, I have reviewed and discussed with patient certain preventive  protocols, quality metrics, and best practice recommendations. A written personalized care plan for preventive services as well as general preventive health recommendations were provided to patient.     Sheral Flow, LPN   12/13/209   Nurse Notes: N/A

## 2022-07-23 NOTE — Telephone Encounter (Signed)
Oral Oncology Patient Advocate Encounter   Received notification that prior authorization for Cabometyx is due for renewal.   Patient is no longer taking so no new Prior Auth is needed at this time.     Berdine Addison, [facility] Patient [place]  [phone] (phone) [phone] (fax) 07/23/2022 12:08 PM

## 2022-08-01 ENCOUNTER — Other Ambulatory Visit: Payer: Self-pay | Admitting: Radiology

## 2022-08-02 ENCOUNTER — Other Ambulatory Visit: Payer: Self-pay

## 2022-08-02 ENCOUNTER — Other Ambulatory Visit: Payer: Self-pay | Admitting: Oncology

## 2022-08-02 ENCOUNTER — Ambulatory Visit (HOSPITAL_COMMUNITY)
Admission: RE | Admit: 2022-08-02 | Discharge: 2022-08-02 | Disposition: A | Discharge status: Home / Self Care | Payer: Medicare Other | Source: Ambulatory Visit | Attending: Oncology | Admitting: Oncology

## 2022-08-02 DIAGNOSIS — D3A8 Other benign neuroendocrine tumors: Secondary | ICD-10-CM | POA: Insufficient documentation

## 2022-08-02 DIAGNOSIS — C7A1 Malignant poorly differentiated neuroendocrine tumors: Secondary | ICD-10-CM | POA: Diagnosis not present

## 2022-08-02 DIAGNOSIS — Z452 Encounter for adjustment and management of vascular access device: Secondary | ICD-10-CM | POA: Diagnosis not present

## 2022-08-02 DIAGNOSIS — C7A00 Malignant carcinoid tumor of unspecified site: Secondary | ICD-10-CM | POA: Diagnosis not present

## 2022-08-02 HISTORY — PX: IR IMAGING GUIDED PORT INSERTION: IMG5740

## 2022-08-02 LAB — GLUCOSE, CAPILLARY
Glucose-Capillary: 90 mg/dL (ref 70–99)
Glucose-Capillary: 103 mg/dL — ABNORMAL HIGH (ref 70–99)

## 2022-08-02 MED ORDER — MIDAZOLAM HCL 2 MG/2ML IJ SOLN
INTRAMUSCULAR | Status: AC | PRN
  Administered 2022-08-02: .5 mg via INTRAVENOUS
  Administered 2022-08-02: 1 mg via INTRAVENOUS

## 2022-08-02 MED ORDER — HEPARIN SOD (PORK) LOCK FLUSH 100 UNIT/ML IV SOLN
INTRAVENOUS | Status: AC
  Filled 2022-08-02: qty 5

## 2022-08-02 MED ORDER — FENTANYL CITRATE (PF) 100 MCG/2ML IJ SOLN
INTRAMUSCULAR | Status: AC
  Filled 2022-08-02: qty 4

## 2022-08-02 MED ORDER — FENTANYL CITRATE (PF) 100 MCG/2ML IJ SOLN
INTRAMUSCULAR | Status: AC | PRN
  Administered 2022-08-02: 50 ug via INTRAVENOUS
  Administered 2022-08-02: 25 ug via INTRAVENOUS

## 2022-08-02 MED ORDER — MIDAZOLAM HCL 2 MG/2ML IJ SOLN
INTRAMUSCULAR | Status: AC
  Filled 2022-08-02: qty 4

## 2022-08-02 MED ORDER — LIDOCAINE-EPINEPHRINE 1 %-1:100000 IJ SOLN
INTRAMUSCULAR | Status: AC
  Filled 2022-08-02: qty 1

## 2022-08-02 NOTE — Procedures (Signed)
Interventional Radiology Procedure Note  Procedure: RT IJ POWER PORT    Complications: None  Estimated Blood Loss:  MIN  Findings: TIP SVCRA    M. TREVOR Soundra Lampley, MD    

## 2022-08-02 NOTE — H&P (Signed)
Chief Complaint: Chemotherapy access. Request is for portacath placement  Referring Physician(s): Betsy Coder B  Supervising Physician: Daryll Brod  Patient Status: [facility] - Out-pt  History of Present Illness: Ronald Thompson is a 66 y.o. male with medical history significant for chest seminoma (1990), left chest DVT and metastatic neuroendocrine tumor with origin of pancreas diagnosed 2015. Pt has received treatment and is followed by oncology. Dr [name] has referred pt to IR for tunneled catheter with port placement for chemotherapy.   Patient alert and laying in bed,calm. Endorses "pain radiating from my kidney" that he states is baseline.  Denies any fevers, headache, chest pain, SOB, cough, nausea, vomiting or bleeding. Return precautions and treatment recommendations and follow-up discussed with the patient  who is agreeable with the plan.    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 25 yrs ago-during cancer tx.    Past Surgical History:  Procedure Laterality Date   BACK SURGERY     '14-  rupt. disc    CARDIAC CATHETERIZATION N/A 01/24/2016   Procedure: Right/Left Heart Cath and Coronary Angiography;  Surgeon: [name], MD;  Location: [facility];  Service: Cardiovascular;  Laterality: N/A;   COLONOSCOPY     EUS N/A 02/10/2014   Procedure: UPPER ENDOSCOPIC ULTRASOUND (EUS) LINEAR;  Surgeon: [name], MD;  Location: [facility];  Service: Endoscopy;  Laterality: N/A;   IR RADIOLOGIST EVAL & MGMT  02/11/2017   KNEE SURGERY Right    age 15 for Baker's cyst   NASAL SEPTUM SURGERY     Deviated septrum   TEE WITHOUT CARDIOVERSION N/A 04/16/2016   Procedure: TRANSESOPHAGEAL ECHOCARDIOGRAM (TEE);  Surgeon: [name], MD;  Location: [place];  Service: Open Heart Surgery;  Laterality: N/A;   THORACOTOMY  1990   wedge biopsy of mediastinal mass   TRANSCATHETER AORTIC VALVE REPLACEMENT, TRANSFEMORAL N/A 04/16/2016   Procedure: TRANSCATHETER AORTIC VALVE REPLACEMENT, TRANSFEMORAL;  Surgeon: [name], MD;  Location: [place];  Service: Open Heart Surgery;  Laterality: N/A;    Allergies: Penicillins  Medications: Prior to Admission medications   Medication Sig Start Date End Date Taking? Authorizing Provider  acetaminophen (TYLENOL) 500 MG tablet Take 1,000 mg by mouth every 6 (six) hours as needed for headache.   Yes [provider]  Aromatic Inhalants (VICKS VAPOR INHALER IN) Inhale 1 spray into the lungs daily as needed (congestion).   Yes [provider]  Cholecalciferol (VITAMIN D-3) 125 MCG (5000 UT) TABS Take  1 tablet by mouth daily in the afternoon.   Yes [provider]  diphenhydrAMINE (BENADRYL) 25 MG tablet Take 25 mg by mouth at bedtime.   Yes [provider]  loperamide (IMODIUM) 2 MG capsule Take 2 mg by mouth as needed for diarrhea or loose stools.   Yes [provider]  losartan (COZAAR) 100 MG tablet Take 1 tablet (100 mg total) by mouth daily. 11/06/21  Yes [name], MD  octreotide (SANDOSTATIN LAR)  30 MG injection Inject 30 mg into the muscle every 28 (twenty-eight) days.   Yes [provider]  oxymetazoline (AFRIN) 0.05 % nasal spray Place 2 sprays into both nostrils at bedtime as needed for congestion.   Yes [provider]  simvastatin (ZOCOR) 20 MG tablet TAKE ONE TABLET BY MOUTH DAILY AT 8PM 11/06/21  Yes [name], MD  tamsulosin (FLOMAX) 0.4 MG CAPS capsule Take 1 capsule (0.4 mg total) by mouth daily. 05/09/22  Yes [name], MD  albuterol (VENTOLIN HFA) 108 (90 Base) MCG/ACT inhaler Inhale 1-2 puffs into the lungs every 6 (six) hours as needed for wheezing or shortness of breath. Patient not taking: Reported on 07/18/2022 12/26/20   [name], MD  cabozantinib (CABOMETYX) 20 MG tablet Take 1 tablet (20 mg total) by mouth daily. Take on an empty stomach, 1 hour before or 2 hours after meals. Patient not taking: Reported on 07/30/2022 07/10/22   Ladell Pier, MD  clindamycin (CLEOCIN) 300 MG capsule Take 2 capsules by mouth one hour prior to dental appointment 05/13/16   [name], MD  Lancet Device MISC Use as instructed to test blood sugar daily E11.65 10/13/19   Shamleffer, Melanie Crazier, MD  Lancets (ONETOUCH DELICA PLUS [id number]) [place] TEST ONCE DAILY 08/20/21   Shamleffer, Melanie Crazier, MD  sildenafil (VIAGRA) 100 MG tablet TAKE ONE-HALF TO ONE TABLET BY MOUTH DAILY AS NEEDED FOR ERECTILE DYSFUNCTION 06/30/22   [name], MD     Family History  Problem Relation Age of Onset   Dementia Mother    Cancer Paternal Grandfather        esophagus   Cancer Maternal Aunt        colon   Emphysema Maternal Aunt     Social History   Socioeconomic History   Marital status: Married    Spouse name: Not on file   Number of children: 3   Years of education: 16   Highest education level: Not on file  Occupational History   Occupation: Administrator, sports: [employer]  Tobacco Use   Smoking status: Never   Smokeless tobacco: Never  Vaping  Use   Vaping Use: Never used  Substance and Sexual Activity   Alcohol use: Yes    Comment: rare- social   Drug use: No   Sexual activity: Yes    Partners: Female  Other Topics Concern   Not on file  Social History Narrative   [name]. 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 2 sons: 1979, 1985- married with 2 daughters 1 in route: Oldest in [place], younger [place], [place]. 1 daughter: 6 [place], married-1 granddaughter.Customer service manager- Nurse, mental health. SO- good health; marriage in good health   Enjoys watching movies, works out at home gym   Social Determinants of Radio broadcast assistant Strain: [place]  (07/23/2022)   Overall Financial Resource Strain (CARDIA)    Difficulty of Paying Living Expenses: Not hard at all  Food Insecurity: No Food Insecurity (  07/23/2022)   Hunger Vital Sign    Worried About Running Out of Food in the Last Year: Never true    [place] in the Last Year: Never true  Transportation Needs: No Transportation Needs (07/23/2022)   PRAPARE - Hydrologist (Medical): No    Lack of Transportation (Non-Medical): No  Physical Activity: Inactive (07/23/2022)   Exercise Vital Sign    Days of Exercise per Week: 0 days    Minutes of Exercise per Session: 0 min  Stress: No Stress Concern Present (07/23/2022)   [place]    Feeling of Stress : Not at all  Social Connections: [place] (07/23/2022)   Social Connection and Isolation Panel [NHANES]    Frequency of Communication with Friends and Family: More than three times a week    Frequency of Social Gatherings with Friends and Family: More than three times a week    Attends Religious Services: 1 to 4 times per year    Active Member of [employer] or Organizations: Yes    Attends Archivist Meetings: 1 to 4 times per year    Marital Status: Married    Review of Systems: A 12 point ROS discussed and pertinent positives  are indicated in the HPI above.  All other systems are negative.  Review of Systems  Constitutional:  Negative for fever.  HENT:  Negative for congestion.   Respiratory:  Negative for cough and shortness of breath.   Cardiovascular:  Negative for chest pain.  Gastrointestinal:  Positive for abdominal pain.  Neurological:  Negative for headaches.  Psychiatric/Behavioral:  Negative for behavioral problems and confusion.     Vital Signs: BP 131/72   Pulse 84   Temp 97.7 F (36.5 C) (Temporal)   Resp 18   Ht '5\' 10"'$  (1.778 m)   Wt 156 lb (70.8 kg)   SpO2 93%   BMI 22.38 kg/m    Physical Exam Vitals and nursing note reviewed.  Constitutional:      Appearance: He is well-developed.  HENT:     Head: Normocephalic.  Cardiovascular:     Rate and Rhythm: Normal rate and regular rhythm.     Heart sounds: Normal heart sounds.  Pulmonary:     Effort: Pulmonary effort is normal.     Breath sounds: Normal breath sounds.  Musculoskeletal:        General: Normal range of motion.     Cervical back: Normal range of motion.  Skin:    General: Skin is dry.  Neurological:     Mental Status: He is alert and oriented to person, place, and time.     Imaging: NM PET Image Restag (PS) Skull Base To Thigh  Result Date: 07/15/2022 CLINICAL DATA:  Subsequent treatment strategy for neuroendocrine tumor with liver metastases. EXAM: NUCLEAR MEDICINE PET SKULL BASE TO THIGH TECHNIQUE: 7.79 mCi F-18 FDG was injected intravenously. Full-ring PET imaging was performed from the skull base to thigh after the radiotracer. CT data was obtained and used for attenuation correction and anatomic localization. Fasting blood glucose: 106 mg/dl COMPARISON:  March 28, 2022 FINDINGS: Mediastinal blood pool activity: SUV max 1.72 Liver activity: SUV max NA NECK: No hypermetabolic lymph nodes in the neck. Incidental CT findings: None. CHEST: No hypermetabolic mediastinal or hilar nodes. No suspicious pulmonary  nodules on the CT scan. Incidental CT findings: Aortic atherosclerosis. Post aortic valve replacement. Mitral annular calcifications. Signs of  coronary artery calcifications. No pericardial effusion. No adenopathy by size criteria in the chest. Mild pleural and parenchymal scarring in the LEFT upper lobe. No sign of consolidation, pleural effusion or suspicious nodule. ABDOMEN/PELVIS: Large hepatic masses with increased metabolic activity. (Image 106/4) 10 x 10 cm LEFT hepatic lobe mass previously 9.3 x 0.6 cm. Current maximum SUV of 7.46 as compared to 8.80. Large 15 cm RIGHT hepatic lobe mass previously approximately 14 cm greatest axial dimension with a maximum SUV currently of 7.26 as compared to 8.78. Inferior LEFT hepatic lobe mass extending from the medial segment of the LEFT hepatic lobe and also likely from the RIGHT hepatic lobe. Two masses in this location on the prior study, as measured as a single area on the current exam given that no discrete boundary exist between these 2 lesions currently there is a 15.8 cm maximal axial dimension as compared to 14 cm, maximum SUV is 5.68 as compared to 6.55. Central hepatic lesion (image 118/4) 3.5 cm as compared to 2.7 cm. Slight increase in size also of a lateral segment LEFT hepatic lobe mass (image 135/4 up to 11.1 cm as compared to 10.0 cm. This lesion arising from the inferior aspect of the lateral segment LEFT hepatic lobe is also similarly diminished in terms of maximum SUV with a maximum SUV of 6.48 as compared to 8.66. No signs of new adenopathy or solid organ disease in the abdomen or pelvis. Bowel is displaced inferiorly due to enlarging hepatic masses. Incidental CT findings: Continued mass-effect, slightly increased upon adjacent structures secondary to enlarging hepatic masses. Increase in pelvic ascites now small to moderate volume No acute findings relative to pancreas, spleen, adrenal glands or kidneys. Urinary bladder is collapsed. Aortic  atherosclerosis without aneurysm. No adenopathy by size criteria in the abdomen or in the pelvis. SKELETON: No focal hypermetabolic activity to suggest skeletal metastasis. Incidental CT findings: Unchanged small sclerotic foci in the LEFT pelvis without signs of increased metabolic activity. IMPRESSION: 1. Slight interval increase in size of hepatic masses with slight decrease in metabolic activity. Enlarging hepatic lesions appears to exert increased mass effect upon structures in the upper abdomen and mid abdomen. 2. No new sites of disease. 3. Slight increase in pelvic ascites now small to moderate volume. 4. Aortic atherosclerosis and coronary artery calcifications. Aortic Atherosclerosis (ICD10-I70.0). Electronically Signed   By: Zetta Bills M.D.   On: 07/15/2022 14:42    Labs:  CBC: Recent Labs    04/25/22 0758 05/23/22 0831 06/20/22 0824 07/18/22 0812  WBC 4.8 4.6 4.1 4.1  HGB 13.5 13.1 12.8* 11.8*  HCT 40.1 39.0 38.3* 35.7*  PLT 103* 117* 109* 131*    COAGS: Recent Labs    10/16/21 0821  INR 1.0    BMP: Recent Labs    04/25/22 0758 05/23/22 0831 06/20/22 0824 07/18/22 0812  NA 139 135 140 138  K 4.3 4.3 4.3 4.4  CL 102 99 100 101  CO2 '29 29 31 28  '$ GLUCOSE 98 97 99 103*  BUN 25* 25* 23 21  CALCIUM 9.1 9.5 9.5 9.2  CREATININE 1.17 1.19 1.14 1.09  GFRNONAA >60 >60 >60 >60    LIVER FUNCTION TESTS: Recent Labs    04/25/22 0758 05/23/22 0831 06/20/22 0824 07/18/22 0812  BILITOT 1.5* 1.5* 1.7* 1.3*  AST 36 37 36 38  ALT '23 27 25 22  '$ ALKPHOS 89 94 101 100  PROT 6.7 6.8 7.0 6.5  ALBUMIN 4.3 4.3 4.3 3.9   Assessment and  Plan:  66 yo male diagnosed with metastatic neuroendocrine tumor presents to IR for tunneled catheter with port placement.   Labs from 12.14.23 unremarkable. All medications are within acceptable parameters. Allergies include PCN. Patient has been NPO since midnight.   Risks and benefits of image guided port-a-catheter placement was  discussed with the patient including, but not limited to bleeding, infection, pneumothorax, or fibrin sheath development and need for additional procedures.  All of the patient's questions were answered, patient is agreeable to proceed. Consent signed and in chart.  Thank you for this interesting consult.  I greatly enjoyed meeting Gottlieb Zuercher and look forward to participating in their care.  A copy of this report was sent to the requesting provider on this date.  Electronically Signed: Tyson Alias, NP 08/02/2022, 8:54 AM   I spent a total of 30 minutes in face to face in clinical consultation, greater than 50% of which was counseling/coordinating care for portacath placement

## 2022-08-05 ENCOUNTER — Other Ambulatory Visit: Payer: Self-pay | Admitting: Oncology

## 2022-08-06 ENCOUNTER — Other Ambulatory Visit: Payer: Self-pay | Admitting: Oncology

## 2022-08-06 ENCOUNTER — Inpatient Hospital Stay (HOSPITAL_BASED_OUTPATIENT_CLINIC_OR_DEPARTMENT_OTHER): Payer: Medicare Other | Admitting: Nurse Practitioner

## 2022-08-06 ENCOUNTER — Inpatient Hospital Stay: Payer: Medicare Other | Attending: Oncology

## 2022-08-06 ENCOUNTER — Inpatient Hospital Stay: Payer: Medicare Other

## 2022-08-06 ENCOUNTER — Encounter: Payer: Self-pay | Admitting: Nurse Practitioner

## 2022-08-06 VITALS — BP 127/86 | HR 80 | Temp 98.1°F | Resp 18 | Ht 70.0 in | Wt 158.0 lb

## 2022-08-06 DIAGNOSIS — D3A8 Other benign neuroendocrine tumors: Secondary | ICD-10-CM

## 2022-08-06 DIAGNOSIS — Z95828 Presence of other vascular implants and grafts: Secondary | ICD-10-CM

## 2022-08-06 DIAGNOSIS — C7B8 Other secondary neuroendocrine tumors: Secondary | ICD-10-CM | POA: Insufficient documentation

## 2022-08-06 DIAGNOSIS — C7A8 Other malignant neuroendocrine tumors: Secondary | ICD-10-CM | POA: Diagnosis not present

## 2022-08-06 DIAGNOSIS — Z5111 Encounter for antineoplastic chemotherapy: Secondary | ICD-10-CM | POA: Diagnosis not present

## 2022-08-06 DIAGNOSIS — Z452 Encounter for adjustment and management of vascular access device: Secondary | ICD-10-CM | POA: Insufficient documentation

## 2022-08-06 LAB — CBC WITH DIFFERENTIAL (CANCER CENTER ONLY)
Abs Immature Granulocytes: 0.01 10*3/uL (ref 0.00–0.07)
Basophils Absolute: 0 10*3/uL (ref 0.0–0.1)
Basophils Relative: 1 %
Eosinophils Absolute: 0.1 10*3/uL (ref 0.0–0.5)
Eosinophils Relative: 1 %
HCT: 33.3 % — ABNORMAL LOW (ref 39.0–52.0)
Hemoglobin: 10.9 g/dL — ABNORMAL LOW (ref 13.0–17.0)
Immature Granulocytes: 0 %
Lymphocytes Relative: 5 %
Lymphs Abs: 0.3 10*3/uL — ABNORMAL LOW (ref 0.7–4.0)
MCH: 34.3 pg — ABNORMAL HIGH (ref 26.0–34.0)
MCHC: 32.7 g/dL (ref 30.0–36.0)
MCV: 104.7 fL — ABNORMAL HIGH (ref 80.0–100.0)
Monocytes Absolute: 0.9 10*3/uL (ref 0.1–1.0)
Monocytes Relative: 15 %
Neutro Abs: 4.5 10*3/uL (ref 1.7–7.7)
Neutrophils Relative %: 78 %
Platelet Count: 134 10*3/uL — ABNORMAL LOW (ref 150–400)
RBC: 3.18 MIL/uL — ABNORMAL LOW (ref 4.22–5.81)
RDW: 14.4 % (ref 11.5–15.5)
WBC Count: 5.8 10*3/uL (ref 4.0–10.5)
nRBC: 0 % (ref 0.0–0.2)

## 2022-08-06 LAB — CMP (CANCER CENTER ONLY)
ALT: 18 U/L (ref 0–44)
AST: 32 U/L (ref 15–41)
Albumin: 4 g/dL (ref 3.5–5.0)
Alkaline Phosphatase: 104 U/L (ref 38–126)
Anion gap: 9 (ref 5–15)
BUN: 23 mg/dL (ref 8–23)
CO2: 28 mmol/L (ref 22–32)
Calcium: 9.3 mg/dL (ref 8.9–10.3)
Chloride: 104 mmol/L (ref 98–111)
Creatinine: 0.85 mg/dL (ref 0.61–1.24)
GFR, Estimated: >60 mL/min (ref >60)
Glucose, Bld: 102 mg/dL — ABNORMAL HIGH (ref 70–99)
Potassium: 4.1 mmol/L (ref 3.5–5.1)
Sodium: 141 mmol/L (ref 135–145)
Total Bilirubin: 1.8 mg/dL — ABNORMAL HIGH (ref 0.3–1.2)
Total Protein: 6.6 g/dL (ref 6.5–8.1)

## 2022-08-06 MED ORDER — SODIUM CHLORIDE 0.9% FLUSH
10.0000 mL | INTRAVENOUS | Status: DC | PRN
  Administered 2022-08-06: 10 mL via INTRAVENOUS

## 2022-08-06 MED ORDER — HEPARIN SOD (PORK) LOCK FLUSH 100 UNIT/ML IV SOLN
500.0000 [IU] | Freq: Once | INTRAVENOUS | Status: AC
  Administered 2022-08-06: 500 [IU] via INTRAVENOUS

## 2022-08-06 MED ORDER — LIDOCAINE-PRILOCAINE 2.5-2.5 % EX CREA: TOPICAL_CREAM | CUTANEOUS | 2 refills | Status: DC

## 2022-08-06 MED ORDER — PROCHLORPERAZINE MALEATE 10 MG PO TABS: 10.0000 mg | ORAL_TABLET | Freq: Four times a day (QID) | ORAL | 3 refills | Status: DC | PRN

## 2022-08-06 NOTE — Progress Notes (Signed)
Ronald Thompson OFFICE PROGRESS NOTE   Diagnosis: Neuroendocrine tumor  INTERVAL HISTORY:   Ronald Thompson returns as scheduled.  He is scheduled to begin treatment with FOLFOX today.  He had a Port-A-Cath placed 08/02/2022.  He feels weak.  He has 3-4 loose stools a day.  He takes Imodium as needed.  No nausea.  Objective:  Vital signs in last 24 hours:  Blood pressure 127/86, pulse 80, temperature 98.1 F (36.7 C), resp. rate 18, height '5\' 10"'$  (1.778 m), weight 158 lb (71.7 kg), SpO2 100 %.    HEENT: No thrush or ulcers. Resp: Lungs are clear.  Breath sounds diminished left lower lung field.  No respiratory distress. Cardio: Regular rate and rhythm. GI: Liver palpable throughout the right abdomen and left upper abdomen. Vascular: Trace bilateral lower leg edema. Neuro: Alert and oriented. Port-A-Cath with resolving ecchymosis.  No erythema.  Lab Results:  Lab Results  Component Value Date   WBC 4.1 07/18/2022   HGB 11.8 (L) 07/18/2022   HCT 35.7 (L) 07/18/2022   MCV 103.2 (H) 07/18/2022   PLT 131 (L) 07/18/2022   NEUTROABS 3.1 07/18/2022    Imaging:  No results found.  Medications: I have reviewed the patient's current medications.  Assessment/Plan: Pancreatic neuroendocrine tumor, WHO grade 2, pancreatic head mass and small peripancreatic/celiac nodes on a CT 02/04/2014 EUS revealed evidence of multiple liver metastases-status post an FNA biopsy of a left liver lesion 02/10/2014 confirming a neuroendocrine tumor   Octreotide scan 04/01/2014 with multiple foci of metastatic neuroendocrine tumor in the liver Monthly Sandostatin started 03/16/2014 Restaging CT 07/04/2014 with resolution of a previously noted pancreas head lesion and probable progression of liver metastases Restaging CT 10/31/2014-stable liver lesions, no evidence of a pancreas mass, stable. Restaging CT 02/28/2015-stable hepatic metastases, stable pancreas lesion unchanged Restaging CT  08/30/2015-stable pancreas mass, slight enlargement of hepatic metastases Monthly Sandostatin continued Restaging CTs 01/17/2016 revealed enlargement of liver lesions, no new lesions, enlargement of the pancreas head mass Gallium DOTATATE scan 10/01/2016 confirmed uptake in the liver metastases, pancreas primary, peripancreatic adenopathy, and left iliac bone Cycle 1 Lutathera 03/05/2017 Cycle 2 Lutathera 04/30/2017 Cycle 3 Lutathera 07/02/2017 Cycle 4 Lutathera 08/27/2017 Netspot scan 09/29/2017-decreased radiotracer activity within the pancreas head, peripancreatic lymph node, and solitary skeletal lesion.  Liver lesions have increased in size with a decrease in radiotracer activity Monthly Sandostatin continued Netspot scan 05/20/2018-stable to decreased size of liver lesions, most have decreased SUV, similar uptake in the pancreas lesion, previously noted peripancreatic lymph node has resolved him a mild decrease in uptake associated with a left iliac metastasis, no evidence of disease progression Monthly Sandostatin continued Netspot scan 01/29/2019- overall stable to improved, 1 lesion left hepatic lobe has increased tracer activity-unchanged in size, majority of hepatic lesions have reduced activity compared to the pretreatment scan, decreased activity left iliac bone lesion, stable activity in the head of the pancreas lesion, no new lesions Monthly Sandostatin continued Netspot on September 24, 2019-increase in size of 3 liver lesions with associated stable radiotracer activity, no new lesions, stable small left iliac lesion Cycle 1 salvage Lutathera 10/27/2019 Cycle 2 salvage Lutathera 12/22/2019 Netspot on 02/15/2020-decrease in radiotracer activity in the liver metastases with a decrease in size of several lesions, no new lesions, no progressive disease in the bowel or mesentery, decreased activity in the left iliac bone lesion Monthly Sandostatin continued  Netspot 10/13/2020-decrease in  radiotracer activity in the left and right liver, decreased size of liver lesions, one lesion  in the superior right liver has increased in size and has no radiotracer activity, no evidence of metastatic disease outside the liver PET 11/15/2020- moderate hypermetabolic activity associated with several enlarging liver lesions that had limited activity on the dotatate PET, a lesion in the left lateral hepatic lobe with mild persistent dotatate activity has mild peripheral FDG activity and is decreased in size from a dotatate PET 1 year ago.  No hypermetabolic activity in the pancreas or bowel.  No hypermetabolic activity in the previous dotatate avid left iliac lesion Monthly Sandostatin continued Cycle 1 capecitabine/temozolomide 01/22/2021 (capecitabine twice daily days 1 through 14 of each 28-day cycle/temozolomide days 10 through 14) Cycle 2 capecitabine/temozolomide 02/19/2021 Cycle 3 capecitabine/temozolomide 03/19/2021 Cycle 4 capecitabine/temozolomide 04/17/2021 PET 05/11/2021-slight increase in size of several FDG avid liver lesions, no new hepatic lesions, no evidence of metastatic disease to the chest or bones Cycle 5 capecitabine/temozolomide 05/16/2021 Cycle 6 capecitabine/temozolomide 06/25/2021, capecitabine dose reduced Cycle 7 capecitabine/temozolomide 07/23/2021 Cycle 8 capecitabine/temozolomide 08/20/2021 PET 09/13/2021-mildly progressive hepatic metastases Everolimus 10/11/2021-discontinued after 2 weeks secondary to cost Cabozantinib 12/13/2021 Cabozantinib discontinued 12/24/2021 Cabozantinib resumed at a reduced dose of 20 mg daily beginning 01/12/2022 PET 03/28/2022-multifocal tracer avid liver metastases, mild increase in size and able to mild increase in tracer uptake, no extrahepatic disease identified Cabozantinib continued at a dose of 20 mg daily PET 07/15/2022-slight interval increase in size of hepatic masses with slight decrease in metabolic activity slight increase in pelvic  ascites, no new site of disease Cycle 1 FOLFOX 08/07/2022    Chest Seminoma in 1990 treated with BEP and chest radiation at [facility] 3. 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 chronic exertional dyspnea following treatment for the seminoma   5. aortic stenosis on an echocardiogram December 2011, severe aortic stenosis on echocardiogram 12/06/2015, status post TAVR on 04/16/2016 6. lumbar disc surgery 2014   7. acute abdominal pain 02/04/2014-resolved   8.  Diabetes 9.  COVID-19 infection July 2022    Disposition: Ronald Thompson appears unchanged.  He is scheduled to begin chemotherapy with cycle 1 FOLFOX 08/07/2022.  We again reviewed potential toxicities.  He agrees to proceed.  Scription sent to his pharmacy for Emla cream and Compazine.  He understands he should not use the Emla cream for treatment tomorrow, okay to use with cycle 2.  He further understands to contact the office with poorly controlled nausea.  CBC reviewed.  Counts adequate to proceed with cycle 1 FOLFOX on 08/07/2022.  Chemistry panel pending.  He will return for lab, follow-up, cycle 2 FOLFOX on 08/20/2022.  We are available to see him sooner if needed.    Ned Card ANP/GNP-BC   08/06/2022  9:16 AM

## 2022-08-06 NOTE — Progress Notes (Signed)
Patient seen by Ned Card NP today  Vitals are within treatment parameters.  Labs reviewed by Ned Card NP and are not all within treatment parameters. Bilirubin is 1.8   Per physician team, patient is ready for treatment. Please note that modifications are being made to the treatment plan including dose reduction of oxaliplatin and 5FU bolus . Ok to treat on 08/07/22

## 2022-08-07 ENCOUNTER — Inpatient Hospital Stay: Payer: Medicare Other

## 2022-08-07 VITALS — BP 127/76 | HR 82 | Temp 98.1°F | Resp 18

## 2022-08-07 DIAGNOSIS — C7A8 Other malignant neuroendocrine tumors: Secondary | ICD-10-CM | POA: Diagnosis not present

## 2022-08-07 DIAGNOSIS — Z452 Encounter for adjustment and management of vascular access device: Secondary | ICD-10-CM | POA: Diagnosis not present

## 2022-08-07 DIAGNOSIS — Z5111 Encounter for antineoplastic chemotherapy: Secondary | ICD-10-CM | POA: Diagnosis not present

## 2022-08-07 DIAGNOSIS — D3A8 Other benign neuroendocrine tumors: Secondary | ICD-10-CM

## 2022-08-07 DIAGNOSIS — C7B8 Other secondary neuroendocrine tumors: Secondary | ICD-10-CM | POA: Diagnosis not present

## 2022-08-07 MED ORDER — OXALIPLATIN CHEMO INJECTION 100 MG/20ML
65.0000 mg/m2 | Freq: Once | INTRAVENOUS | Status: AC
  Administered 2022-08-07: 125 mg via INTRAVENOUS
  Filled 2022-08-07: qty 10

## 2022-08-07 MED ORDER — SODIUM CHLORIDE 0.9 % IV SOLN
2000.0000 mg/m2 | INTRAVENOUS | Status: DC
  Administered 2022-08-07: 3800 mg via INTRAVENOUS
  Filled 2022-08-07: qty 76

## 2022-08-07 MED ORDER — LEUCOVORIN CALCIUM INJECTION 350 MG
300.0000 mg/m2 | Freq: Once | INTRAVENOUS | Status: AC
  Administered 2022-08-07: 568 mg via INTRAVENOUS
  Filled 2022-08-07: qty 28.4

## 2022-08-07 MED ORDER — PALONOSETRON HCL INJECTION 0.25 MG/5ML
0.2500 mg | Freq: Once | INTRAVENOUS | Status: AC
  Administered 2022-08-07: 0.25 mg via INTRAVENOUS
  Filled 2022-08-07: qty 5

## 2022-08-07 MED ORDER — DEXTROSE 5 % IV SOLN: Freq: Once | INTRAVENOUS | Status: AC

## 2022-08-07 MED ORDER — SODIUM CHLORIDE 0.9 % IV SOLN
10.0000 mg | Freq: Once | INTRAVENOUS | Status: AC
  Administered 2022-08-07: 10 mg via INTRAVENOUS
  Filled 2022-08-07: qty 10

## 2022-08-07 MED ORDER — FLUOROURACIL CHEMO INJECTION 2.5 GM/50ML
300.0000 mg/m2 | Freq: Once | INTRAVENOUS | Status: AC
  Administered 2022-08-07: 550 mg via INTRAVENOUS
  Filled 2022-08-07: qty 11

## 2022-08-07 NOTE — Patient Instructions (Addendum)
[facility]   The chemotherapy medication bag should finish at 46 hours, 96 hours, or 7 days. For example, if your pump is scheduled for 46 hours and it was put on at 4:00 p.m., it should finish at 2:00 p.m. the day it is scheduled to come off regardless of your appointment time.     Estimated time to finish at 10:45 Friday, August 09, 2022.   If the display on your pump reads "Low Volume" and it is beeping, take the batteries out of the pump and come to the cancer center for it to be taken off.   If the pump alarms go off prior to the pump reading "Low Volume" then call [phone] and someone can assist you.  If the plunger comes out and the chemotherapy medication is leaking out, please use your home chemo spill kit to clean up the spill. Do NOT use paper towels or other household products.  If you have problems or questions regarding your pump, please call either [phone] (24 hours a day) or the cancer center Monday-Friday 8:00 a.m.- 4:30 p.m. at the clinic number and we will assist you. If you are unable to get assistance, then go to the nearest Emergency Department and ask the staff to contact the IV team for assistance.  Discharge Instructions: Thank you for choosing [facility] to provide your oncology and hematology care.   If you have a lab appointment with the [facility], please go directly to the [facility] and check in at the registration area.   Wear comfortable clothing and clothing appropriate for easy access to any Portacath or PICC line.   We strive to give you quality time with your provider. You may need to reschedule your appointment if you arrive late (15 or more minutes).  Arriving late affects you and other patients whose appointments are after yours.  Also, if you miss three or more appointments without notifying the office, you may be dismissed from the clinic at the provider's discretion.      For prescription  refill requests, have your pharmacy contact our office and allow 72 hours for refills to be completed.    Today you received the following chemotherapy and/or immunotherapy agents Oxaliplatin, Leucovorin, Fluorouracil.      To help prevent nausea and vomiting after your treatment, we encourage you to take your nausea medication as directed.  BELOW ARE SYMPTOMS THAT SHOULD BE REPORTED IMMEDIATELY: *FEVER GREATER THAN 100.4 F (38 C) OR HIGHER *CHILLS OR SWEATING *NAUSEA AND VOMITING THAT IS NOT CONTROLLED WITH YOUR NAUSEA MEDICATION *UNUSUAL SHORTNESS OF BREATH *UNUSUAL BRUISING OR BLEEDING *URINARY PROBLEMS (pain or burning when urinating, or frequent urination) *BOWEL PROBLEMS (unusual diarrhea, constipation, pain near the anus) TENDERNESS IN MOUTH AND THROAT WITH OR WITHOUT PRESENCE OF ULCERS (sore throat, sores in mouth, or a toothache) UNUSUAL RASH, SWELLING OR PAIN  UNUSUAL VAGINAL DISCHARGE OR ITCHING   Items with * indicate a potential emergency and should be followed up as soon as possible or go to the Emergency Department if any problems should occur.  Please show the CHEMOTHERAPY ALERT CARD or IMMUNOTHERAPY ALERT CARD at check-in to the Emergency Department and triage nurse.  Should you have questions after your visit or need to cancel or reschedule your appointment, please contact [facility]  Dept: [phone]  and follow the prompts.  Office hours are 8:00 a.m. to 4:30 p.m. Monday - Friday. Please note that voicemails  left after 4:00 p.m. may not be returned until the following business day.  We are closed weekends and major holidays. You have access to a nurse at all times for urgent questions. Please call the main number to the clinic Dept: [phone] and follow the prompts.   For any non-urgent questions, you may also contact your provider using MyChart. We now offer e-Visits for anyone 55 and older to request care online for non-urgent  symptoms. For details visit [email or web address].   Also download the MyChart app! Go to the app store, search "MyChart", open the app, select [facility], and log in with your MyChart username and password.  Oxaliplatin Injection What is this medication? OXALIPLATIN (ox AL i PLA tin) treats some types of cancer. It works by slowing down the growth of cancer cells. This medicine may be used for other purposes; ask your health care provider or pharmacist if you have questions. COMMON BRAND NAME(S): Eloxatin What should I tell my care team before I take this medication? They need to know if you have any of these conditions: Heart disease History of irregular heartbeat or rhythm Liver disease Low blood cell levels (white cells, red cells, and platelets) Lung or breathing disease, such as asthma Take medications that treat or prevent blood clots Tingling of the fingers, toes, or other nerve disorder An unusual or allergic reaction to oxaliplatin, other medications, foods, dyes, or preservatives If you or your partner are pregnant or trying to get pregnant Breast-feeding How should I use this medication? This medication is injected into a vein. It is given by your care team in a hospital or clinic setting. Talk to your care team about the use of this medication in children. Special care may be needed. Overdosage: If you think you have taken too much of this medicine contact a poison control center or emergency room at once. NOTE: This medicine is only for you. Do not share this medicine with others. What if I miss a dose? Keep appointments for follow-up doses. It is important not to miss a dose. Call your care team if you are unable to keep an appointment. What may interact with this medication? Do not take this medication with any of the following: Cisapride Dronedarone Pimozide Thioridazine This medication may also interact with the following: Aspirin and aspirin-like  medications Certain medications that treat or prevent blood clots, such as warfarin, apixaban, dabigatran, and rivaroxaban Cisplatin Cyclosporine Diuretics Medications for infection, such as acyclovir, adefovir, amphotericin B, bacitracin, cidofovir, foscarnet, ganciclovir, gentamicin, pentamidine, vancomycin NSAIDs, medications for pain and inflammation, such as ibuprofen or naproxen Other medications that cause heart rhythm changes Pamidronate Zoledronic acid This list may not describe all possible interactions. Give your health care provider a list of all the medicines, herbs, non-prescription drugs, or dietary supplements you use. Also tell them if you smoke, drink alcohol, or use illegal drugs. Some items may interact with your medicine. What should I watch for while using this medication? Your condition will be monitored carefully while you are receiving this medication. You may need blood work while taking this medication. This medication may make you feel generally unwell. This is not uncommon as chemotherapy can affect healthy cells as well as cancer cells. Report any side effects. Continue your course of treatment even though you feel ill unless your care team tells you to stop. This medication may increase your risk of getting an infection. Call your care team for advice if you get a fever, chills, sore throat, or  other symptoms of a cold or flu. Do not treat yourself. Try to avoid being around people who are sick. Avoid taking medications that contain aspirin, acetaminophen, ibuprofen, naproxen, or ketoprofen unless instructed by your care team. These medications may hide a fever. Be careful brushing or flossing your teeth or using a toothpick because you may get an infection or bleed more easily. If you have any dental work done, tell your dentist you are receiving this medication. This medication can make you more sensitive to cold. Do not drink cold drinks or use ice. Cover exposed  skin before coming in contact with cold temperatures or cold objects. When out in cold weather wear warm clothing and cover your mouth and nose to warm the air that goes into your lungs. Tell your care team if you get sensitive to the cold. Talk to your care team if you or your partner are pregnant or think either of you might be pregnant. This medication can cause serious birth defects if taken during pregnancy and for 9 months after the last dose. A negative pregnancy test is required before starting this medication. A reliable form of contraception is recommended while taking this medication and for 9 months after the last dose. Talk to your care team about effective forms of contraception. Do not father a child while taking this medication and for 6 months after the last dose. Use a condom while having sex during this time period. Do not breastfeed while taking this medication and for 3 months after the last dose. This medication may cause infertility. Talk to your care team if you are concerned about your fertility. What side effects may I notice from receiving this medication? Side effects that you should report to your care team as soon as possible: Allergic reactions--skin rash, itching, hives, swelling of the face, lips, tongue, or throat Bleeding--bloody or black, tar-like stools, vomiting blood or brown material that looks like coffee grounds, red or dark brown urine, small red or purple spots on skin, unusual bruising or bleeding Dry cough, shortness of breath or trouble breathing Heart rhythm changes--fast or irregular heartbeat, dizziness, feeling faint or lightheaded, chest pain, trouble breathing Infection--fever, chills, cough, sore throat, wounds that don't heal, pain or trouble when passing urine, general feeling of discomfort or being unwell Liver injury--right upper belly pain, loss of appetite, nausea, light-colored stool, dark yellow or brown urine, yellowing skin or eyes, unusual  weakness or fatigue Low red blood cell level--unusual weakness or fatigue, dizziness, headache, trouble breathing Muscle injury--unusual weakness or fatigue, muscle pain, dark yellow or brown urine, decrease in amount of urine Pain, tingling, or numbness in the hands or feet Sudden and severe headache, confusion, change in vision, seizures, which may be signs of posterior reversible encephalopathy syndrome (PRES) Unusual bruising or bleeding Side effects that usually do not require medical attention (report to your care team if they continue or are bothersome): Diarrhea Nausea Pain, redness, or swelling with sores inside the mouth or throat Unusual weakness or fatigue Vomiting This list may not describe all possible side effects. Call your doctor for medical advice about side effects. You may report side effects to FDA at 1-800-FDA-1088. Where should I keep my medication? This medication is given in a hospital or clinic. It will not be stored at home. NOTE: This sheet is a summary. It may not cover all possible information. If you have questions about this medicine, talk to your doctor, pharmacist, or health care provider.  2023 Elsevier/Gold Standard (  2007-09-12 00:00:00)  Leucovorin Injection What is this medication? LEUCOVORIN (loo koe VOR in) prevents side effects from certain medications, such as methotrexate. It works by increasing folate levels. This helps protect healthy cells in your body. It may also be used to treat anemia caused by low levels of folate. It can also be used with fluorouracil, a type of chemotherapy, to treat colorectal cancer. It works by increasing the effects of fluorouracil in the body. This medicine may be used for other purposes; ask your health care provider or pharmacist if you have questions. What should I tell my care team before I take this medication? They need to know if you have any of these conditions: Anemia from low levels of vitamin B12 in the  blood An unusual or allergic reaction to leucovorin, folic acid, other medications, foods, dyes, or preservatives Pregnant or trying to get pregnant Breastfeeding How should I use this medication? This medication is injected into a vein or a muscle. It is given by your care team in a hospital or clinic setting. Talk to your care team about the use of this medication in children. Special care may be needed. Overdosage: If you think you have taken too much of this medicine contact a poison control center or emergency room at once. NOTE: This medicine is only for you. Do not share this medicine with others. What if I miss a dose? Keep appointments for follow-up doses. It is important not to miss your dose. Call your care team if you are unable to keep an appointment. What may interact with this medication? Capecitabine Fluorouracil Phenobarbital Phenytoin Primidone Trimethoprim;sulfamethoxazole This list may not describe all possible interactions. Give your health care provider a list of all the medicines, herbs, non-prescription drugs, or dietary supplements you use. Also tell them if you smoke, drink alcohol, or use illegal drugs. Some items may interact with your medicine. What should I watch for while using this medication? Your condition will be monitored carefully while you are receiving this medication. This medication may increase the side effects of 5-fluorouracil. Tell your care team if you have diarrhea or mouth sores that do not get better or that get worse. What side effects may I notice from receiving this medication? Side effects that you should report to your care team as soon as possible: Allergic reactions--skin rash, itching, hives, swelling of the face, lips, tongue, or throat This list may not describe all possible side effects. Call your doctor for medical advice about side effects. You may report side effects to FDA at 1-800-FDA-1088. Where should I keep my  medication? This medication is given in a hospital or clinic. It will not be stored at home. NOTE: This sheet is a summary. It may not cover all possible information. If you have questions about this medicine, talk to your doctor, pharmacist, or health care provider.  2023 Elsevier/Gold Standard (2021-12-25 00:00:00)  Fluorouracil Injection What is this medication? FLUOROURACIL (flure oh YOOR a sil) treats some types of cancer. It works by slowing down the growth of cancer cells. This medicine may be used for other purposes; ask your health care provider or pharmacist if you have questions. COMMON BRAND NAME(S): Adrucil What should I tell my care team before I take this medication? They need to know if you have any of these conditions: Blood disorders Dihydropyrimidine dehydrogenase (DPD) deficiency Infection, such as chickenpox, cold sores, herpes Kidney disease Liver disease Poor nutrition Recent or ongoing radiation therapy An unusual or allergic reaction to  fluorouracil, other medications, foods, dyes, or preservatives If you or your partner are pregnant or trying to get pregnant Breast-feeding How should I use this medication? This medication is injected into a vein. It is administered by your care team in a hospital or clinic setting. Talk to your care team about the use of this medication in children. Special care may be needed. Overdosage: If you think you have taken too much of this medicine contact a poison control center or emergency room at once. NOTE: This medicine is only for you. Do not share this medicine with others. What if I miss a dose? Keep appointments for follow-up doses. It is important not to miss your dose. Call your care team if you are unable to keep an appointment. What may interact with this medication? Do not take this medication with any of the following: Live virus vaccines This medication may also interact with the following: Medications that treat  or prevent blood clots, such as warfarin, enoxaparin, dalteparin This list may not describe all possible interactions. Give your health care provider a list of all the medicines, herbs, non-prescription drugs, or dietary supplements you use. Also tell them if you smoke, drink alcohol, or use illegal drugs. Some items may interact with your medicine. What should I watch for while using this medication? Your condition will be monitored carefully while you are receiving this medication. This medication may make you feel generally unwell. This is not uncommon as chemotherapy can affect healthy cells as well as cancer cells. Report any side effects. Continue your course of treatment even though you feel ill unless your care team tells you to stop. In some cases, you may be given additional medications to help with side effects. Follow all directions for their use. This medication may increase your risk of getting an infection. Call your care team for advice if you get a fever, chills, sore throat, or other symptoms of a cold or flu. Do not treat yourself. Try to avoid being around people who are sick. This medication may increase your risk to bruise or bleed. Call your care team if you notice any unusual bleeding. Be careful brushing or flossing your teeth or using a toothpick because you may get an infection or bleed more easily. If you have any dental work done, tell your dentist you are receiving this medication. Avoid taking medications that contain aspirin, acetaminophen, ibuprofen, naproxen, or ketoprofen unless instructed by your care team. These medications may hide a fever. Do not treat diarrhea with over the counter products. Contact your care team if you have diarrhea that lasts more than 2 days or if it is severe and watery. This medication can make you more sensitive to the sun. Keep out of the sun. If you cannot avoid being in the sun, wear protective clothing and sunscreen. Do not use sun lamps,  tanning beds, or tanning booths. Talk to your care team if you or your partner wish to become pregnant or think you might be pregnant. This medication can cause serious birth defects if taken during pregnancy and for 3 months after the last dose. A reliable form of contraception is recommended while taking this medication and for 3 months after the last dose. Talk to your care team about effective forms of contraception. Do not father a child while taking this medication and for 3 months after the last dose. Use a condom while having sex during this time period. Do not breastfeed while taking this medication. This medication  may cause infertility. Talk to your care team if you are concerned about your fertility. What side effects may I notice from receiving this medication? Side effects that you should report to your care team as soon as possible: Allergic reactions--skin rash, itching, hives, swelling of the face, lips, tongue, or throat Heart attack--pain or tightness in the chest, shoulders, arms, or jaw, nausea, shortness of breath, cold or clammy skin, feeling faint or lightheaded Heart failure--shortness of breath, swelling of the ankles, feet, or hands, sudden weight gain, unusual weakness or fatigue Heart rhythm changes--fast or irregular heartbeat, dizziness, feeling faint or lightheaded, chest pain, trouble breathing High ammonia level--unusual weakness or fatigue, confusion, loss of appetite, nausea, vomiting, seizures Infection--fever, chills, cough, sore throat, wounds that don't heal, pain or trouble when passing urine, general feeling of discomfort or being unwell Low red blood cell level--unusual weakness or fatigue, dizziness, headache, trouble breathing Pain, tingling, or numbness in the hands or feet, muscle weakness, change in vision, confusion or trouble speaking, loss of balance or coordination, trouble walking, seizures Redness, swelling, and blistering of the skin over hands and  feet Severe or prolonged diarrhea Unusual bruising or bleeding Side effects that usually do not require medical attention (report to your care team if they continue or are bothersome): Dry skin Headache Increased tears Nausea Pain, redness, or swelling with sores inside the mouth or throat Sensitivity to light Vomiting This list may not describe all possible side effects. Call your doctor for medical advice about side effects. You may report side effects to FDA at 1-800-FDA-1088. Where should I keep my medication? This medication is given in a hospital or clinic. It will not be stored at home. NOTE: This sheet is a summary. It may not cover all possible information. If you have questions about this medicine, talk to your doctor, pharmacist, or health care provider.  2023 Elsevier/Gold Standard (2021-11-20 00:00:00)

## 2022-08-08 ENCOUNTER — Telehealth: Payer: Self-pay | Admitting: Emergency Medicine

## 2022-08-08 LAB — CHROMOGRANIN A: Chromogranin A (ng/mL): 62390 ng/mL — ABNORMAL HIGH (ref 0.0–101.8)

## 2022-08-08 NOTE — Telephone Encounter (Signed)
24 Hour Callback 24 hour callback post 1st time [facility]fox.  Pt reports no side effects. No cold sensitivity noted. Pt reports that he is actually feeling much better than he has been. Eating and drinking well. Pt had no questions or concerns. Will call with any questions or concerns.

## 2022-08-09 ENCOUNTER — Inpatient Hospital Stay: Payer: Medicare Other

## 2022-08-09 VITALS — BP 125/70 | HR 91 | Temp 97.6°F | Resp 18

## 2022-08-09 DIAGNOSIS — Z5111 Encounter for antineoplastic chemotherapy: Secondary | ICD-10-CM | POA: Diagnosis not present

## 2022-08-09 DIAGNOSIS — C7B8 Other secondary neuroendocrine tumors: Secondary | ICD-10-CM | POA: Diagnosis not present

## 2022-08-09 DIAGNOSIS — Z452 Encounter for adjustment and management of vascular access device: Secondary | ICD-10-CM | POA: Diagnosis not present

## 2022-08-09 DIAGNOSIS — D3A8 Other benign neuroendocrine tumors: Secondary | ICD-10-CM

## 2022-08-09 DIAGNOSIS — C7A8 Other malignant neuroendocrine tumors: Secondary | ICD-10-CM | POA: Diagnosis not present

## 2022-08-09 MED ORDER — HEPARIN SOD (PORK) LOCK FLUSH 100 UNIT/ML IV SOLN
500.0000 [IU] | Freq: Once | INTRAVENOUS | Status: AC | PRN
  Administered 2022-08-09: 500 [IU]

## 2022-08-09 MED ORDER — SODIUM CHLORIDE 0.9% FLUSH
10.0000 mL | INTRAVENOUS | Status: DC | PRN
  Administered 2022-08-09: 10 mL

## 2022-08-09 NOTE — Patient Instructions (Signed)

## 2022-08-12 ENCOUNTER — Telehealth: Payer: Self-pay

## 2022-08-12 NOTE — Telephone Encounter (Signed)
Patient report he is having difficult breathing at night when laying in the bed. Shortness of breath during the day with exertion. The SOB seem to get worse  laying in the bed.  -legs are aching, feet  and ankles are swelling -not sleeping well -fatigue  These symptoms started a couple days after his treatment.  I ask the patient did he try to sleep with his head  elevated in the bed. Patient states it very uncomfortable to sleep with his head elevated. He would like for the provider to prescript  him with something to help with breathing at night. I advice the patient if his SOB get worse he needs to go to the ED. Patient gave verbal understanding and had no further question or concern at this time.

## 2022-08-13 ENCOUNTER — Inpatient Hospital Stay (HOSPITAL_BASED_OUTPATIENT_CLINIC_OR_DEPARTMENT_OTHER)
Admission: EM | Admit: 2022-08-13 | Discharge: 2022-08-15 | DRG: 291 | Disposition: A | Discharge status: Home / Self Care | Payer: Medicare Other | Attending: Family Medicine | Admitting: Family Medicine

## 2022-08-13 ENCOUNTER — Other Ambulatory Visit: Payer: Self-pay

## 2022-08-13 ENCOUNTER — Encounter (HOSPITAL_COMMUNITY): Payer: Self-pay

## 2022-08-13 ENCOUNTER — Emergency Department (HOSPITAL_BASED_OUTPATIENT_CLINIC_OR_DEPARTMENT_OTHER): Payer: Medicare Other

## 2022-08-13 DIAGNOSIS — I5033 Acute on chronic diastolic (congestive) heart failure: Secondary | ICD-10-CM | POA: Diagnosis not present

## 2022-08-13 DIAGNOSIS — I3139 Other pericardial effusion (noninflammatory): Secondary | ICD-10-CM | POA: Diagnosis not present

## 2022-08-13 DIAGNOSIS — N179 Acute kidney failure, unspecified: Secondary | ICD-10-CM | POA: Diagnosis present

## 2022-08-13 DIAGNOSIS — N400 Enlarged prostate without lower urinary tract symptoms: Secondary | ICD-10-CM | POA: Diagnosis present

## 2022-08-13 DIAGNOSIS — R0902 Hypoxemia: Secondary | ICD-10-CM | POA: Diagnosis not present

## 2022-08-13 DIAGNOSIS — E7800 Pure hypercholesterolemia, unspecified: Secondary | ICD-10-CM | POA: Diagnosis present

## 2022-08-13 DIAGNOSIS — I110 Hypertensive heart disease with heart failure: Secondary | ICD-10-CM | POA: Diagnosis not present

## 2022-08-13 DIAGNOSIS — C7A8 Other malignant neuroendocrine tumors: Secondary | ICD-10-CM | POA: Diagnosis present

## 2022-08-13 DIAGNOSIS — J45909 Unspecified asthma, uncomplicated: Secondary | ICD-10-CM | POA: Diagnosis present

## 2022-08-13 DIAGNOSIS — R188 Other ascites: Secondary | ICD-10-CM | POA: Diagnosis present

## 2022-08-13 DIAGNOSIS — Y842 Radiological procedure and radiotherapy as the cause of abnormal reaction of the patient, or of later complication, without mention of misadventure at the time of the procedure: Secondary | ICD-10-CM | POA: Diagnosis present

## 2022-08-13 DIAGNOSIS — Z86718 Personal history of other venous thrombosis and embolism: Secondary | ICD-10-CM | POA: Diagnosis not present

## 2022-08-13 DIAGNOSIS — Z8547 Personal history of malignant neoplasm of testis: Secondary | ICD-10-CM | POA: Diagnosis not present

## 2022-08-13 DIAGNOSIS — I088 Other rheumatic multiple valve diseases: Secondary | ICD-10-CM | POA: Diagnosis present

## 2022-08-13 DIAGNOSIS — Z8701 Personal history of pneumonia (recurrent): Secondary | ICD-10-CM

## 2022-08-13 DIAGNOSIS — I342 Nonrheumatic mitral (valve) stenosis: Secondary | ICD-10-CM | POA: Diagnosis not present

## 2022-08-13 DIAGNOSIS — Z825 Family history of asthma and other chronic lower respiratory diseases: Secondary | ICD-10-CM

## 2022-08-13 DIAGNOSIS — Q256 Stenosis of pulmonary artery: Secondary | ICD-10-CM | POA: Diagnosis not present

## 2022-08-13 DIAGNOSIS — Z953 Presence of xenogenic heart valve: Secondary | ICD-10-CM | POA: Diagnosis not present

## 2022-08-13 DIAGNOSIS — I272 Pulmonary hypertension, unspecified: Secondary | ICD-10-CM | POA: Diagnosis present

## 2022-08-13 DIAGNOSIS — J90 Pleural effusion, not elsewhere classified: Secondary | ICD-10-CM | POA: Diagnosis not present

## 2022-08-13 DIAGNOSIS — C787 Secondary malignant neoplasm of liver and intrahepatic bile duct: Secondary | ICD-10-CM

## 2022-08-13 DIAGNOSIS — Z923 Personal history of irradiation: Secondary | ICD-10-CM | POA: Diagnosis not present

## 2022-08-13 DIAGNOSIS — Z1152 Encounter for screening for COVID-19: Secondary | ICD-10-CM | POA: Diagnosis not present

## 2022-08-13 DIAGNOSIS — C7B8 Other secondary neuroendocrine tumors: Secondary | ICD-10-CM | POA: Diagnosis present

## 2022-08-13 DIAGNOSIS — D3A8 Other benign neuroendocrine tumors: Secondary | ICD-10-CM | POA: Diagnosis not present

## 2022-08-13 DIAGNOSIS — E119 Type 2 diabetes mellitus without complications: Secondary | ICD-10-CM

## 2022-08-13 DIAGNOSIS — I700 Atherosclerosis of aorta: Secondary | ICD-10-CM | POA: Diagnosis present

## 2022-08-13 DIAGNOSIS — J9601 Acute respiratory failure with hypoxia: Secondary | ICD-10-CM | POA: Diagnosis not present

## 2022-08-13 DIAGNOSIS — I10 Essential (primary) hypertension: Secondary | ICD-10-CM | POA: Diagnosis present

## 2022-08-13 DIAGNOSIS — Z79899 Other long term (current) drug therapy: Secondary | ICD-10-CM

## 2022-08-13 DIAGNOSIS — Z952 Presence of prosthetic heart valve: Secondary | ICD-10-CM

## 2022-08-13 DIAGNOSIS — R0602 Shortness of breath: Secondary | ICD-10-CM | POA: Diagnosis not present

## 2022-08-13 DIAGNOSIS — Z8719 Personal history of other diseases of the digestive system: Secondary | ICD-10-CM

## 2022-08-13 DIAGNOSIS — I370 Nonrheumatic pulmonary valve stenosis: Secondary | ICD-10-CM | POA: Diagnosis not present

## 2022-08-13 DIAGNOSIS — J8410 Pulmonary fibrosis, unspecified: Secondary | ICD-10-CM | POA: Diagnosis not present

## 2022-08-13 DIAGNOSIS — I2510 Atherosclerotic heart disease of native coronary artery without angina pectoris: Secondary | ICD-10-CM | POA: Diagnosis present

## 2022-08-13 DIAGNOSIS — I509 Heart failure, unspecified: Secondary | ICD-10-CM | POA: Diagnosis not present

## 2022-08-13 DIAGNOSIS — R7989 Other specified abnormal findings of blood chemistry: Secondary | ICD-10-CM | POA: Diagnosis not present

## 2022-08-13 DIAGNOSIS — R609 Edema, unspecified: Secondary | ICD-10-CM | POA: Diagnosis not present

## 2022-08-13 DIAGNOSIS — Z880 Allergy status to penicillin: Secondary | ICD-10-CM

## 2022-08-13 DIAGNOSIS — Z800 Family history of malignant neoplasm of digestive organs: Secondary | ICD-10-CM

## 2022-08-13 DIAGNOSIS — I379 Nonrheumatic pulmonary valve disorder, unspecified: Secondary | ICD-10-CM | POA: Diagnosis not present

## 2022-08-13 DIAGNOSIS — R197 Diarrhea, unspecified: Secondary | ICD-10-CM | POA: Diagnosis not present

## 2022-08-13 DIAGNOSIS — E785 Hyperlipidemia, unspecified: Secondary | ICD-10-CM | POA: Diagnosis not present

## 2022-08-13 DIAGNOSIS — K769 Liver disease, unspecified: Secondary | ICD-10-CM | POA: Diagnosis not present

## 2022-08-13 LAB — LIPASE, BLOOD: Lipase: 38 U/L (ref 11–51)

## 2022-08-13 LAB — COMPREHENSIVE METABOLIC PANEL
ALT: 20 U/L (ref 0–44)
AST: 29 U/L (ref 15–41)
Albumin: 3.6 g/dL (ref 3.5–5.0)
Alkaline Phosphatase: 128 U/L — ABNORMAL HIGH (ref 38–126)
Anion gap: 8 (ref 5–15)
BUN: 21 mg/dL (ref 8–23)
CO2: 31 mmol/L (ref 22–32)
Calcium: 9 mg/dL (ref 8.9–10.3)
Chloride: 101 mmol/L (ref 98–111)
Creatinine, Ser: 0.85 mg/dL (ref 0.61–1.24)
GFR, Estimated: >60 mL/min (ref >60)
Glucose, Bld: 131 mg/dL — ABNORMAL HIGH (ref 70–99)
Potassium: 4.3 mmol/L (ref 3.5–5.1)
Sodium: 140 mmol/L (ref 135–145)
Total Bilirubin: 1.5 mg/dL — ABNORMAL HIGH (ref 0.3–1.2)
Total Protein: 6.2 g/dL — ABNORMAL LOW (ref 6.5–8.1)

## 2022-08-13 LAB — RESP PANEL BY RT-PCR (RSV, FLU A&B, COVID)  RVPGX2
Influenza A by PCR: NEGATIVE
Influenza B by PCR: NEGATIVE
Resp Syncytial Virus by PCR: NEGATIVE
SARS Coronavirus 2 by RT PCR: NEGATIVE

## 2022-08-13 LAB — CBC
HCT: 30.1 % — ABNORMAL LOW (ref 39.0–52.0)
Hemoglobin: 9.8 g/dL — ABNORMAL LOW (ref 13.0–17.0)
MCH: 33.9 pg (ref 26.0–34.0)
MCHC: 32.6 g/dL (ref 30.0–36.0)
MCV: 104.2 fL — ABNORMAL HIGH (ref 80.0–100.0)
Platelets: 120 10*3/uL — ABNORMAL LOW (ref 150–400)
RBC: 2.89 MIL/uL — ABNORMAL LOW (ref 4.22–5.81)
RDW: 14.2 % (ref 11.5–15.5)
WBC: 5.5 10*3/uL (ref 4.0–10.5)
nRBC: 0.4 % — ABNORMAL HIGH (ref 0.0–0.2)

## 2022-08-13 LAB — BRAIN NATRIURETIC PEPTIDE: B Natriuretic Peptide: 1164.3 pg/mL — ABNORMAL HIGH (ref 0.0–100.0)

## 2022-08-13 LAB — PROTIME-INR
INR: 1.1 (ref 0.8–1.2)
Prothrombin Time: 14.5 seconds (ref 11.4–15.2)

## 2022-08-13 MED ORDER — ACETAMINOPHEN 650 MG RE SUPP: 650.0000 mg | Freq: Four times a day (QID) | RECTAL | Status: DC | PRN

## 2022-08-13 MED ORDER — SIMVASTATIN 20 MG PO TABS
10.0000 mg | ORAL_TABLET | Freq: Every day | ORAL | Status: DC
  Administered 2022-08-14: 10 mg via ORAL
  Filled 2022-08-13: qty 1

## 2022-08-13 MED ORDER — CHLORHEXIDINE GLUCONATE CLOTH 2 % EX PADS
6.0000 | MEDICATED_PAD | Freq: Every day | CUTANEOUS | Status: DC
  Administered 2022-08-14 – 2022-08-15 (×2): 6 via TOPICAL

## 2022-08-13 MED ORDER — LEVOFLOXACIN IN D5W 750 MG/150ML IV SOLN
750.0000 mg | Freq: Once | INTRAVENOUS | Status: AC
  Administered 2022-08-13: 750 mg via INTRAVENOUS
  Filled 2022-08-13: qty 150

## 2022-08-13 MED ORDER — FUROSEMIDE 10 MG/ML IJ SOLN
40.0000 mg | Freq: Once | INTRAMUSCULAR | Status: AC
  Administered 2022-08-13: 40 mg via INTRAVENOUS
  Filled 2022-08-13: qty 4

## 2022-08-13 MED ORDER — SODIUM CHLORIDE 0.9% FLUSH
3.0000 mL | Freq: Two times a day (BID) | INTRAVENOUS | Status: DC
  Administered 2022-08-14 – 2022-08-15 (×4): 3 mL via INTRAVENOUS

## 2022-08-13 MED ORDER — SODIUM CHLORIDE 0.9 % IV SOLN: 250.0000 mL | INTRAVENOUS | Status: DC | PRN

## 2022-08-13 MED ORDER — INSULIN ASPART 100 UNIT/ML IJ SOLN: 0.0000 [IU] | INTRAMUSCULAR | Status: DC

## 2022-08-13 MED ORDER — HYDROCODONE-ACETAMINOPHEN 5-325 MG PO TABS: 1.0000 | ORAL_TABLET | ORAL | Status: DC | PRN

## 2022-08-13 MED ORDER — SODIUM CHLORIDE 0.9% FLUSH: 3.0000 mL | INTRAVENOUS | Status: DC | PRN

## 2022-08-13 MED ORDER — IOHEXOL 350 MG/ML SOLN
100.0000 mL | Freq: Once | INTRAVENOUS | Status: AC | PRN
  Administered 2022-08-13: 100 mL via INTRAVENOUS

## 2022-08-13 MED ORDER — TAMSULOSIN HCL 0.4 MG PO CAPS
0.4000 mg | ORAL_CAPSULE | Freq: Every day | ORAL | Status: DC
  Administered 2022-08-14 – 2022-08-15 (×2): 0.4 mg via ORAL
  Filled 2022-08-13 (×2): qty 1

## 2022-08-13 MED ORDER — ALBUTEROL SULFATE (2.5 MG/3ML) 0.083% IN NEBU: 3.0000 mL | INHALATION_SOLUTION | Freq: Four times a day (QID) | RESPIRATORY_TRACT | Status: DC | PRN

## 2022-08-13 MED ORDER — ACETAMINOPHEN 325 MG PO TABS: 650.0000 mg | ORAL_TABLET | Freq: Four times a day (QID) | ORAL | Status: DC | PRN

## 2022-08-13 NOTE — Assessment & Plan Note (Signed)
This seems to be new and acquired compared to prior CT scan discussed case with pulmonology who feels that patient would be better served with IR consult versus CT surgery consult. IR consult entered.  Please consult to CT surgery in the morning if not amendable to IR intervention. Keep n.p.o. postmidnight .  Gently fluid manage Suspect acute respiratory failure and worsening shortness of breath could be contributed to this new finding. Appreciate help from cardiology with fluid management

## 2022-08-13 NOTE — ED Notes (Signed)
Patient transported to CT 

## 2022-08-13 NOTE — ED Notes (Signed)
Patient transported back from CT 

## 2022-08-13 NOTE — Assessment & Plan Note (Signed)
Followed by cardiology chronic stable repeat echogram

## 2022-08-13 NOTE — H&P (Signed)
Ronald Thompson AST:[id number] DOB: 10-05-1955 DOA: 08/13/2022     PCP: [name], MD   Outpatient Specialists:  CARDS:   Dr.Nasher    Oncology  Dr. Ammie Dalton    Patient arrived to ER on 08/13/22 at 1517 Referred by Attending Marcelyn Bruins, MD   Patient coming from:    home Lives   With family Independent living in [place]    Chief Complaint:   Chief Complaint  Patient presents with   Shortness of Breath    HPI: Ronald Thompson is a 67 y.o. male with medical history significant of neuroendocrine tumor with metastases to the liver, asthma, hypertension, hyperlipidemia, BPH, chest seminoma (1990)    Presented with  increased shortness of breath Patient lives at independent living [name] presents with shortness of breath for the past few days was found to have sats in the 87% on room air improved to 96% on 3 L of EMS.  Has known history of cancer neuroendocrine tumor of the pancreas.  with liver metastases.  Presents with abdominal swelling as well as ankle swelling Last chemotherapy was January 3 he had infusion of leucovorin and oxaliplatin At baseline patient has trouble laying down flat however lately has gotten much worse and he feels more fluid overloaded.  And more short of breath he is abdomen is seems to be more swollen him had any fevers or chills no chest pain but his leg swelling off more. Recent PET scan done on December 11 showed increase in size of his hepatic mass which causing mass effect in the abdomen and small to moderate ascites   No fever no chills     Initial COVID TEST  NEGATIVE   Lab Results  Component Value Date   [place] NEGATIVE 08/13/2022   [place] NEGATIVE 11/19/2020   [place] Not Detected 06/25/2019     Regarding pertinent Chronic problems:     Hyperlipidemia -  on statins Zocor Lipid Panel     Component Value Date/Time   CHOL 135 05/09/2022 0857   TRIG 71.0 05/09/2022 0857   HDL 40.60 05/09/2022 0857   CHOLHDL 3  05/09/2022 0857   VLDL 14.2 05/09/2022 0857   LDLCALC 80 05/09/2022 0857   LDLDIRECT 139.2 10/10/2008 0800     HTN on  cozaar 100   chronic CHF diastolic/systolic/ combined - last echo 12/2021 May 2023 severe mitral annular calcification and mild to mod mitral stenosis, also elevated pulmonary systolic pressures could be due to pulmonary fibrosis  TAVR   DM 2 -  Lab Results  Component Value Date   HGBA1C 6.2 05/09/2022    diet controlled     Hx of DVT/PE on - not on anticoagulation  DVT in the neck in the left side     Liver disease MELD 3.0: 9 at 08/13/2022      BPH - on Flomax,     Chronic anemia - baseline hg Hemoglobin & Hematocrit  Recent Labs    07/18/22 0812 08/06/22 0916 08/13/22 1546  HGB 11.8* 10.9* 9.8*    While in ER: Clinical Course as of 08/13/22 2300  Tue Aug 13, 2022  1623 Brain natriuretic peptide(!) Elevated at 1164 [JK]  1625 Comprehensive metabolic panel(!) Bili elevated [JK]  1626 CBC(!) Hgb trending downward [JK]  1627 Cxr suggest atelectasis or infection, no effusion [JK]  1813 Chest x-ray shows cardiomegaly.  No PE.  Possible mild atelectasis versus pneumonia [JK]  1813 CT angio without PE, dilated pulmonary arterial vessels noted,  stenosis of left main pulm artery, large liver mets noted, no large ascites [JK]    Clinical Course User Index [JK] Dorie Rank, MD   CT angio without PE, dilated pulmonary arterial vessels noted,  stenosis of left main pulm artery, large liver mets noted, no large ascites    CXR - atelectasis  CTA chest - without PE, dilated pulmonary arterial vessels noted, stenosis of left main pulm artery, large liver mets noted, no large ascites   There is calcifications along the main pulmonary artery with dilated pulmonary arterial vessels diffusely. There is also what appears to be a high-grade stricture or stenosis involving the origin of the left main pulmonary artery with variant enhancement of the pulmonary artery  distal to the stenosis.  Following Medications were ordered in ER: Medications  iohexol (OMNIPAQUE) 350 MG/ML injection 100 mL (100 mLs Intravenous Contrast Given 08/13/22 1713)  furosemide (LASIX) injection 40 mg (40 mg Intravenous Given 08/13/22 1851)  levofloxacin (LEVAQUIN) IVPB 750 mg (0 mg Intravenous Stopped 08/13/22 2107)       ED Triage Vitals  Enc Vitals Group     BP 08/13/22 1518 114/78     Pulse Rate 08/13/22 1518 100     Resp 08/13/22 1518 (!) 24     Temp 08/13/22 1518 98.1 F (36.7 C)     Temp Source 08/13/22 1518 Oral     SpO2 08/13/22 1518 (!) 88 %     Weight 08/13/22 1523 156 lb (70.8 kg)     Height 08/13/22 1523 '5\' 10"'$  (1.778 m)     Head Circumference --      Peak Flow --      Pain Score --      Pain Loc --      Pain Edu? --      Excl. in [place]? --   TMAX(24)@     _________________________________________ Significant initial  Findings: Abnormal Labs Reviewed  COMPREHENSIVE METABOLIC PANEL - Abnormal; Notable for the following components:      Result Value   Glucose, Bld 131 (*)    Total Protein 6.2 (*)    Alkaline Phosphatase 128 (*)    Total Bilirubin 1.5 (*)    All other components within normal limits  CBC - Abnormal; Notable for the following components:   RBC 2.89 (*)    Hemoglobin 9.8 (*)    HCT 30.1 (*)    MCV 104.2 (*)    Platelets 120 (*)    nRBC 0.4 (*)    All other components within normal limits  BRAIN NATRIURETIC PEPTIDE - Abnormal; Notable for the following components:   B Natriuretic Peptide 1,164.3 (*)    All other components within normal limits    _________________________ Troponin 33 ECG: Ordered Personally reviewed and interpreted by me showing: HR : 96 Rhythm:  NSR,     no evidence of ischemic changes QTC 469   The recent clinical data is shown below. Vitals:   08/13/22 2100 08/13/22 2115 08/13/22 2204 08/13/22 2214  BP: 105/74 112/71  121/81  Pulse: 91 89  94  Resp:    18  Temp:    97.6 F (36.4 C)  TempSrc:    Oral   SpO2: 95% 98%  95%  Weight:   73.1 kg   Height:   '5\' 10"'$  (1.778 m)     WBC     Component Value Date/Time   WBC 5.5 08/13/2022 1546   LYMPHSABS 0.3 (L) 08/06/2022 0916   LYMPHSABS 0.2 (  L) 07/25/2017 0923   MONOABS 0.9 08/06/2022 0916   MONOABS 0.7 07/25/2017 0923   EOSABS 0.1 08/06/2022 0916   EOSABS 0.1 07/25/2017 0923   BASOSABS 0.0 08/06/2022 0916   BASOSABS 0.0 07/25/2017 0923     Lactic Acid, Venous No results found for: "LATICACIDVEN"   Procalcitonin   Ordered     UA   ordered     Results for orders placed or performed during the hospital encounter of 08/13/22  Resp panel by RT-PCR (RSV, Flu A&B, Covid) Anterior Nasal Swab     Status: None   Collection Time: 08/13/22  4:00 PM   Specimen: Anterior Nasal Swab  Result Value Ref Range Status   SARS Coronavirus 2 by RT PCR NEGATIVE NEGATIVE Final         Influenza A by PCR NEGATIVE NEGATIVE Final   Influenza B by PCR NEGATIVE NEGATIVE Final         Resp Syncytial Virus by PCR NEGATIVE NEGATIVE Final           _______________________________________________ Hospitalist was called for admission for    Pulmonary artery stenosis    Malignant neoplasm metastatic to liver [facility])    The following Work up has been ordered so far:  Orders Placed This Encounter  Procedures   Resp panel by RT-PCR (RSV, Flu A&B, Covid) Anterior Nasal Swab   DG Chest Port 1 View   CT Angio Chest PE W and/or Wo Contrast   CT ABDOMEN PELVIS W CONTRAST   Comprehensive metabolic panel   CBC   Brain natriuretic peptide   Lipase, blood   Protime-INR   Cardiac monitoring   Initiate Carrier Fluid Protocol   Cardiac Monitoring Continuous x 24 hours Indications for use: Sub-acute heart failure   Consult to hospitalist   Pulse oximetry, continuous   ED EKG   Saline lock IV   Saline lock IV   Place in observation (patient's expected length of stay will be less than 2 midnights)     OTHER Significant initial  Findings:  labs  showing:    Recent Labs  Lab 08/13/22 1546  NA 140  K 4.3  CO2 31  GLUCOSE 131*  BUN 21  CREATININE 0.85  CALCIUM 9.0    Cr stable,  Up from baseline see below Lab Results  Component Value Date   CREATININE 0.85 08/13/2022   CREATININE 0.85 08/06/2022   CREATININE 1.09 07/18/2022    Recent Labs  Lab 08/13/22 1546 08/13/22 2315  AST 29 37  ALT 20 24  ALKPHOS 128* 145*  BILITOT 1.5* 1.8*  PROT 6.2* 6.3*  ALBUMIN 3.6 3.3*   Lab Results  Component Value Date   CALCIUM 9.1 08/13/2022   PHOS 3.5 08/13/2022        Plt: Lab Results  Component Value Date   PLT 120 (L) 08/13/2022         Recent Labs  Lab 08/13/22 1546 08/13/22 2315  WBC 5.5 5.2  NEUTROABS  --  4.1  HGB 9.8* 10.2*  HCT 30.1* 30.0*  MCV 104.2* 102.0*  PLT 120* 121*    HG/HCT  stable,      Component Value Date/Time   HGB 9.8 (L) 08/13/2022 1546   HGB 10.9 (L) 08/06/2022 0916   HGB 15.2 07/25/2017 0923   HCT 30.1 (L) 08/13/2022 1546   HCT 45.9 07/25/2017 0923   MCV 104.2 (H) 08/13/2022 1546   MCV 96.4 07/25/2017 0923     Recent Labs  Lab  08/13/22 1546  LIPASE 38   No results for input(s): "AMMONIA" in the last 168 hours.    Cardiac Panel (last 3 results) No results for input(s): "CKTOTAL", "CKMB", "TROPONINI", "RELINDX" in the last 72 hours.  .car BNP (last 3 results) Recent Labs    08/13/22 1546  BNP 1,164.3*    DM  labs:  HbA1C: Recent Labs    05/09/22 0857  HGBA1C 6.2       CBG (last 3)  No results for input(s): "GLUCAP" in the last 72 hours.        Cultures: No results found for: "SDES", "SPECREQUEST", "CULT", "REPTSTATUS"   Radiological Exams on Admission: CT ABDOMEN PELVIS W CONTRAST  Result Date: 08/13/2022 CLINICAL DATA:  History of liver Mets and cancer EXAM: CT ABDOMEN AND PELVIS WITH CONTRAST TECHNIQUE: Multidetector CT imaging of the abdomen and pelvis was performed using the standard protocol following bolus administration of intravenous  contrast. RADIATION DOSE REDUCTION: This exam was performed according to the departmental dose-optimization program which includes automated exposure control, adjustment of the mA and/or kV according to patient size and/or use of iterative reconstruction technique. CONTRAST:  145m OMNIPAQUE IOHEXOL 350 MG/ML SOLN COMPARISON:  PET CT 07/15/2022, 03/28/2022, CT 02/29/2016 FINDINGS: Lower chest: Lung bases demonstrate small bilateral pleural effusions. Partial atelectasis or pneumonia in the right lower lobe clear cardiomegaly with aortic valve prosthesis. Similar elevation left diaphragm. Hepatobiliary: Markedly enlarged liver. Numerous hepatic metastatic lesions. Left hepatic lobe mass measures 10.1 x 10.2 cm, previously 10.1 x 10.2 cm. Dominant right hepatic mass measures 15.1 x 12.3 cm, previous 15.1 cm maximum. Inferior left hepatic mass measuring 11.2 cm compared with 11.2 cm previously. Numerous additional large hepatic masses. Pancreas: Displaced to the left upper quadrant by hepatic masses. No acute inflammatory process. Spleen: Normal in size without focal abnormality. Adrenals/Urinary Tract: Adrenal glands are within normal limits. The kidneys show no hydronephrosis. The bladder is unremarkable Stomach/Bowel: The stomach is nonenlarged. No dilated small bowel. Bowel is displaced to the left upper quadrant by enlarged liver. No acute bowel wall thickening. Vascular/Lymphatic: Moderate aortic atherosclerosis. No aneurysm. No suspicious lymph nodes. Mass effect on hepatic vessels by multiple large liver masses. Reproductive: Prostate is slightly enlarged Other: No free air.  No significant pelvic effusion. Musculoskeletal: No acute osseous abnormality. Small foci of sclerosis within the left iliac bone without significant change IMPRESSION: 1. Markedly enlarged liver with numerous large hepatic metastatic lesions without gross interval change in size as compared with PET CT from December. 2. Small bilateral  pleural effusions with partial atelectasis or pneumonia in the right lower lobe. Cardiomegaly. 3. Aortic atherosclerosis. Aortic Atherosclerosis (ICD10-I70.0). Electronically Signed   By: KDonavan FoilM.D.   On: 08/13/2022 17:52   CT Angio Chest PE W and/or Wo Contrast  Result Date: 08/13/2022 CLINICAL DATA:  Shortness of breath for several days. Hypoxic. No neoplasm with liver metastases. Neuroendocrine tumor. On chemotherapy. EXAM: CT ANGIOGRAPHY CHEST WITH CONTRAST TECHNIQUE: Multidetector CT imaging of the chest was performed using the standard protocol during bolus administration of intravenous contrast. Multiplanar CT image reconstructions and MIPs were obtained to evaluate the vascular anatomy. RADIATION DOSE REDUCTION: This exam was performed according to the departmental dose-optimization program which includes automated exposure control, adjustment of the mA and/or kV according to patient size and/or use of iterative reconstruction technique. CONTRAST:  1053mOMNIPAQUE IOHEXOL 350 MG/ML SOLN COMPARISON:  Chest x-ray earlier 08/13/2022. CT angiogram 01/17/22 older exams well. FINDINGS: Cardiovascular: The heart is enlarged. Particularly involving  the right atrium with some reflux of contrast into the hepatic veins. Status post TAVR. Normal caliber thoracic aorta otherwise with some scattered mild plaque. Small pericardial effusion. Right IJ chest port. Some enlargement of the pulmonary artery. Please correlate for any evidence of pulmonary arterial hypertension. There is a focal stricture along the proximal left main pulmonary artery. There is differences in opacification from the contrast injection proximal and distal to the stricture. Appearance suggests a high-grade stenosis. Please correlate for any known history. There are areas of peripheral arterial branches in the left lung which have suboptimal enhancement. Calcifications are seen along the main pulmonary artery origin. With the motion and the  contrast enhancement no large, central pulmonary embolism identified. Mediastinum/Nodes: No specific abnormal lymph node enlargement present in the axillary region, hilum. A few small mediastinal nodes are seen, not pathologic by size criteria. Lungs/Pleura: Tiny bilateral pleural effusions are seen, right greater than left. Adjacent parenchymal opacity is identified. There are scattered ground-glass. Areas of interstitial septal thickening. More bandlike areas in the lingula likely again atelectatic or scar. The right effusion is new from previous. The adjacent opacity is also new. Diffuse breathing motion. Upper Abdomen: Please see separate dictation of the abdomen and pelvis CT from the same day. Musculoskeletal: Anasarca. Scattered degenerative changes along the spine. Review of the MIP images confirms the above findings. IMPRESSION: Status post TAVR.  Enlarged heart. There is calcifications along the main pulmonary artery with dilated pulmonary arterial vessels diffusely. There is also what appears to be a high-grade stricture or stenosis involving the origin of the left main pulmonary artery with variant enhancement of the pulmonary artery distal to the stenosis. Please correlate with any known history or further workup is recommended. Development of a small right pleural effusion with adjacent parenchymal opacity. Atelectasis versus infiltrate. Stable tiny left effusion. Areas of interstitial septal thickening. Chest port. Please see separate dictation of abdomen and pelvis CT. Aortic Atherosclerosis (ICD10-I70.0). Electronically Signed   By: Jill Side M.D.   On: 08/13/2022 17:39   DG Chest Port 1 View  Result Date: 08/13/2022 CLINICAL DATA:  Shortness of breath. EXAM: PORTABLE CHEST 1 VIEW COMPARISON:  Chest radiograph November 19, 2020 FINDINGS: Port-A-Cath tip projects over the superior vena cava. Monitoring leads overlie the patient. Stable cardiac and mediastinal contours. Elevation left  hemidiaphragm. Scattered predominately lower lung heterogeneous opacities. No pleural effusion or pneumothorax. Thoracic spine degenerative changes. IMPRESSION: Scattered lower lung heterogeneous opacities may represent atelectasis or infection. Electronically Signed   By: Lovey Newcomer M.D.   On: 08/13/2022 16:19   _______________________________________________________________________________________________________ Latest  Blood pressure 121/81, pulse 94, temperature 97.6 F (36.4 C), temperature source Oral, resp. rate 18, height '5\' 10"'$  (1.778 m), weight 73.1 kg, SpO2 95 %.   Vitals  labs and radiology finding personally reviewed  Review of Systems:    Pertinent positives include:   fatigue, Bilateral lower extremity swelling shortness of breath at rest.   dyspnea on exertion,  Constitutional:  No weight loss, night sweats, Fevers, chills, weight loss  HEENT:  No headaches, Difficulty swallowing,Tooth/dental problems,Sore throat,  No sneezing, itching, ear ache, nasal congestion, post nasal drip,  Cardio-vascular:  No chest pain, Orthopnea, PND, anasarca, dizziness, palpitations.no  GI:  No heartburn, indigestion, abdominal pain, nausea, vomiting, diarrhea, change in bowel habits, loss of appetite, melena, blood in stool, hematemesis Resp:  no No excess mucus, no productive cough, No non-productive cough, No coughing up of blood.No change in color of mucus.No wheezing. Skin:  no rash or lesions. No jaundice GU:  no dysuria, change in color of urine, no urgency or frequency. No straining to urinate.  No flank pain.  Musculoskeletal:  No joint pain or no joint swelling. No decreased range of motion. No back pain.  Psych:  No change in mood or affect. No depression or anxiety. No memory loss.  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 25 yrs ago-during cancer tx.      Past Surgical History:  Procedure Laterality Date   BACK SURGERY     '14- rupt. disc    CARDIAC CATHETERIZATION N/A 01/24/2016   Procedure: Right/Left Heart Cath and Coronary Angiography;  Surgeon: [name], MD;  Location: [facility];  Service: Cardiovascular;  Laterality: N/A;   COLONOSCOPY     EUS N/A 02/10/2014   Procedure: UPPER ENDOSCOPIC ULTRASOUND (EUS) LINEAR;  Surgeon: [name], MD;  Location: [facility];  Service: Endoscopy;  Laterality: N/A;   IR IMAGING GUIDED PORT INSERTION  08/02/2022   IR RADIOLOGIST EVAL & MGMT  02/11/2017   KNEE SURGERY Right    age 60 for Baker's cyst   NASAL SEPTUM SURGERY     Deviated septrum   TEE WITHOUT CARDIOVERSION N/A 04/16/2016   Procedure: TRANSESOPHAGEAL ECHOCARDIOGRAM (TEE);  Surgeon: [name], MD;  Location: [facility];  Service:  Open Heart Surgery;  Laterality: N/A;   THORACOTOMY  1990   wedge biopsy of mediastinal mass   TRANSCATHETER AORTIC VALVE REPLACEMENT, TRANSFEMORAL N/A 04/16/2016   Procedure: TRANSCATHETER AORTIC VALVE REPLACEMENT, TRANSFEMORAL;  Surgeon: [name], MD;  Location: [facility];  Service: Open Heart Surgery;  Laterality: N/A;    Social History:  Ambulatory   independently       reports that he has never smoked. He has never used smokeless tobacco. He reports current alcohol use. He reports that he does not use drugs.   Family History:  Family History  Problem Relation Age of Onset   Dementia Mother    Cancer Paternal Grandfather        esophagus   Cancer Maternal Aunt        colon   Emphysema Maternal Aunt    ____________________________________ Allergies: Allergies  Allergen Reactions   Penicillins Hives  ENTIRE BODY Has patient had a PCN reaction causing immediate rash, facial/tongue/throat swelling, SOB or lightheadedness with hypotension: No Has patient had a PCN reaction causing severe rash involving mucus membranes or skin necrosis: No Has patient had a PCN reaction that required hospitalization * *  YES  * * Has patient had a PCN reaction occurring within the last 10 years: No If all of the above answers are "NO", then may proceed with Cephalosporin use. *reaction occurred when he was 17     Prior to Admission medications   Medication Sig Start Date End Date Taking? Authorizing Provider  acetaminophen (TYLENOL) 500 MG tablet Take 1,000 mg by mouth every 6 (six) hours as needed for headache.    [provider]  albuterol (VENTOLIN HFA) 108 (90 Base) MCG/ACT inhaler Inhale 1-2 puffs into the lungs every 6 (six) hours as needed for wheezing or shortness of breath. 12/26/20   [name], MD  Aromatic Inhalants (VICKS VAPOR INHALER IN) Inhale 1 spray into the lungs daily as needed (congestion).    [provider]  Cholecalciferol (VITAMIN D-3) 125 MCG  (5000 UT) TABS Take 1 tablet by mouth daily in the afternoon.    [provider]  clindamycin (CLEOCIN) 300 MG capsule Take 2 capsules by mouth one hour prior to dental appointment 05/13/16   [name], MD  diphenhydrAMINE (BENADRYL) 25 MG tablet Take 25 mg by mouth at bedtime.    [provider]  Lancet Device MISC Use as instructed to test blood sugar daily E11.65 10/13/19   Shamleffer, Melanie Crazier, MD  Lancets [facility] DELICA PLUS [id number]) May TEST ONCE DAILY 08/20/21   Shamleffer, Melanie Crazier, MD  lidocaine-prilocaine (EMLA) cream Apply to portacath site 1 hour prior to use 08/06/22   Owens Shark, NP  loperamide (IMODIUM) 2 MG capsule Take 2 mg by mouth as needed for diarrhea or loose stools.    [provider]  losartan (COZAAR) 100 MG tablet Take 1 tablet (100 mg total) by mouth daily. 11/06/21   [name], MD  octreotide (SANDOSTATIN LAR) 30 MG injection Inject 30 mg into the muscle every 28 (twenty-eight) days.    [provider]  oxymetazoline (AFRIN) 0.05 % nasal spray Place 2 sprays into both nostrils at bedtime as needed for congestion.    [provider]  prochlorperazine (COMPAZINE) 10 MG tablet Take 1 tablet (10 mg total) by mouth every 6 (six) hours as needed for nausea or vomiting. 08/06/22   Owens Shark, NP  sildenafil (VIAGRA) 100 MG tablet TAKE ONE-HALF TO ONE TABLET BY MOUTH DAILY AS NEEDED FOR ERECTILE DYSFUNCTION 06/30/22   [name], MD  simvastatin (ZOCOR) 20 MG tablet TAKE ONE TABLET BY MOUTH DAILY AT 8PM 11/06/21   [name], MD  tamsulosin (FLOMAX) 0.4 MG CAPS capsule Take 1 capsule (0.4 mg total) by mouth daily. 05/09/22   [name], MD    ___________________________________________________________________________________________________ Physical Exam:    08/13/2022   10:14 PM 08/13/2022   10:04 PM 08/13/2022    9:15 PM  Vitals with BMI  Height  '5\' 10"'$    Weight  161 lbs 3 oz   BMI  01.74    Systolic 944  967  Diastolic 81  71  Pulse 94  89     1. General:  in No  Acute distress   Chronically ill  -appearing 2. Psychological: Alert and   Oriented 3. Head/ENT:   Moist  Mucous Membranes                          Head Non traumatic, neck supple                            Poor Dentition 4. SKIN: decreased Skin turgor,  Skin clean Dry and intact no rash 5. Heart: Regular rate and rhythm systolic Murmur, no Rub or gallop 6. Lungs: no wheezes or crackles   7. Abdomen: non-tender distended firm, bowel sounds present 8. Lower extremities: no clubbing, cyanosis,  1+ edema 9. Neurologically Grossly intact, moving all 4 extremities equally   10. MSK: Normal range of motion    Chart has been reviewed  ______________________________________________________________________________________________  Assessment/Plan  67 y.o. male with medical history significant of neuroendocrine tumor with metastases to the liver, asthma, hypertension, hyperlipidemia, BPH, chest seminoma (1990)   Admitted for    Pulmonary artery stenosis that Is symptomatic    Malignant neoplasm metastatic to liver [facility] [facility])     Present on Admission:  Acute on chronic diastolic CHF (congestive heart failure) ([place])  Hyperlipidemia  Primary neuroendocrine tumor of pancreas  HTN (hypertension)  Pulmonary fibrosis ([place])  Acute respiratory failure with hypoxia (HCC)  Elevated troponin    S/P TAVR (transcatheter aortic valve replacement) Followed by cardiology chronic stable repeat echogram  Hyperlipidemia Chronic stable continue Zocor 10 mg p.o. daily  Primary neuroendocrine tumor of pancreas Will let oncology know the patient has been admitted given progression of his disease.  HTN (hypertension) Allow permissive hypertension for tonight monitor blood pressure defer to cardiology regarding diuresis given pulmonary artery stenosis  Pulmonary fibrosis (HCC) Chronic in the setting of history of chest  irradiation.  But could be contributing to currently shortness of breath  Type 2 diabetes mellitus without complication, without long-term current use of insulin (HCC) Mild diet controlled monitor sliding scale  Pulmonary artery stenosis This seems to be new and acquired compared to prior CT scan discussed case with pulmonology who feels that patient would be better served with IR consult versus CT surgery consult. IR consult entered.  Please consult to CT surgery in the morning if not amendable to IR intervention. Keep n.p.o. postmidnight .  Gently fluid manage Suspect acute respiratory failure and worsening shortness of breath could be contributed to this new finding. Appreciate help from cardiology with fluid management  Acute respiratory failure with hypoxia ([place])  this patient has acute respiratory failure with Hypoxia  as documented by the presence of following: O2 saturatio< 90% on RA  Likely due to: pulmonary stenosis, pulmonary HTN Provide O2 therapy and titrate as needed  Continuous pulse ox   check Pulse ox with ambulation prior to discharge   may need  TC consult for home O2 set up     Elevated troponin Continue to cycle  Order echo in AM      Other plan as per orders.  DVT prophylaxis:  SCD     Code Status:    Code Status: Prior FULL CODE as per patient   I had personally discussed CODE STATUS with patient     Family Communication:   Family not at  Bedside    Disposition Plan:                             Back to current facility when stable  Following barriers for discharge:                                                     Will likely need home health, home O2, set up                           Will need consultants to evaluate patient prior to discharge                     Would benefit from PT/OT eval prior to DC  Ordered                     Consults called: IR is aware  Admission status:  ED Disposition     ED  Disposition  Admit   Condition  --   [place]: Story [100100]  Level of Care: Telemetry Cardiac [103]  Interfacility transfer: Yes  May place patient in observation at [facility] or [place] if equivalent level of care is available:: No  Covid Evaluation: Confirmed COVID Negative  Diagnosis: Acute on chronic diastolic CHF (congestive heart failure) [facility]) [[id number]]  Admitting Physician: Marcelyn Bruins [[id number]]  Attending Physician: Marcelyn Bruins [[id number]]           Obs    Level of care     tele  For 12H     Lab Results  Component Value Date   [place] 08/13/2022      Precautions: admitted as   Covid Negative      Neveah Bang 08/14/2022, 2:31 AM    [facility]     after 2 AM please page floor coverage PA If 7AM-7PM, please contact the day team taking care of the patient using [email or web address]   Patient was evaluated in the context of the global COVID-19 pandemic, which necessitated consideration that the patient might be at risk for infection with the SARS-CoV-2 virus that causes COVID-19. Institutional protocols and algorithms that pertain to the evaluation of patients at risk for COVID-19 are in a state of rapid change based on information released by regulatory bodies including the CDC and federal and state organizations. These policies and algorithms were followed during the patient's care.

## 2022-08-13 NOTE — ED Triage Notes (Signed)
BIB [facility] from independent living at [facility]. Complains of SOB x several days. 87% on RA with EMS-->96$ on 3LPM via [place]. HX cancer with liver mets. Swelling to ankles and abdomen. Aox4. Received chemo here at [facility].

## 2022-08-13 NOTE — Assessment & Plan Note (Signed)
Allow permissive hypertension for tonight monitor blood pressure defer to cardiology regarding diuresis given pulmonary artery stenosis

## 2022-08-13 NOTE — Subjective & Objective (Signed)
Patient lives at independent living [facility] presents with shortness of breath for the past few days was found to have sats in the 87% on room air improved to 96% on 3 L of EMS.  Has known history of cancer neuroendocrine tumor of the pancreas.  with liver metastases.  Presents with abdominal swelling as well as ankle swelling Last chemotherapy was January 3 he had infusion of leucovorin and oxaliplatin At baseline patient has trouble laying down flat however lately has gotten much worse and he feels more fluid overloaded.  And more short of breath he is abdomen is seems to be more swollen him had any fevers or chills no chest pain but his leg swelling off more. Recent PET scan done on December 11 showed increase in size of his hepatic mass which causing mass effect in the abdomen and small to moderate ascites

## 2022-08-13 NOTE — Assessment & Plan Note (Signed)
Chronic in the setting of history of chest irradiation.  But could be contributing to currently shortness of breath

## 2022-08-13 NOTE — Assessment & Plan Note (Signed)
Chronic stable continue Zocor 10 mg p.o. daily

## 2022-08-13 NOTE — H&P (Incomplete)
Ronald Thompson IRJ:[id number] DOB: 1955/09/01 DOA: 08/13/2022     PCP: [name], MD   Outpatient Specialists:  CARDS:   Dr.Nasher    Oncology  Dr. Ammie Dalton    Patient arrived to ER on 08/13/22 at 1517 Referred by Attending Marcelyn Bruins, MD   Patient coming from:    home Lives   With family Independent living in [place]    Chief Complaint:   Chief Complaint  Patient presents with  . Shortness of Breath    HPI: Charlis Thompson is a 67 y.o. male with medical history significant of neuroendocrine tumor with metastases to the liver, asthma, hypertension, hyperlipidemia, BPH, chest seminoma (1990)    Presented with  increased shortness of breath Patient lives at independent living Ronald Thompson presents with shortness of breath for the past few days was found to have sats in the 87% on room air improved to 96% on 3 L of EMS.  Has known history of cancer neuroendocrine tumor of the pancreas.  with liver metastases.  Presents with abdominal swelling as well as ankle swelling Last chemotherapy was January 3 he had infusion of leucovorin and oxaliplatin At baseline patient has trouble laying down flat however lately has gotten much worse and he feels more fluid overloaded.  And more short of breath he is abdomen is seems to be more swollen him had any fevers or chills no chest pain but his leg swelling off more. Recent PET scan done on December 11 showed increase in size of his hepatic mass which causing mass effect in the abdomen and small to moderate ascites   No fever no chills     Initial COVID TEST  NEGATIVE   Lab Results  Component Value Date   [place] NEGATIVE 08/13/2022   [place] NEGATIVE 11/19/2020   [place] Not Detected 06/25/2019     Regarding pertinent Chronic problems:     Hyperlipidemia -  on statins Zocor Lipid Panel     Component Value Date/Time   CHOL 135 05/09/2022 0857   TRIG 71.0 05/09/2022 0857   HDL 40.60 05/09/2022 0857   CHOLHDL 3  05/09/2022 0857   VLDL 14.2 05/09/2022 0857   LDLCALC 80 05/09/2022 0857   LDLDIRECT 139.2 10/10/2008 0800     HTN on  cozaar 100   chronic CHF diastolic/systolic/ combined - last echo 12/2021 May 2023 severe mitral annular calcification and mild to mod mitral stenosis, also elevated pulmonary systolic pressures could be due to pulmonary fibrosis  TAVR   DM 2 -  Lab Results  Component Value Date   HGBA1C 6.2 05/09/2022    diet controlled     Hx of DVT/PE on - not on anticoagulation  DVT in the neck in the left side     Liver disease MELD 3.0: 9 at 08/13/2022      BPH - on Flomax,     Chronic anemia - baseline hg Hemoglobin & Hematocrit  Recent Labs    07/18/22 0812 08/06/22 0916 08/13/22 1546  HGB 11.8* 10.9* 9.8*    While in ER: Clinical Course as of 08/13/22 2300  Tue Aug 13, 2022  1623 Brain natriuretic peptide(!) Elevated at 1164 [JK]  1625 Comprehensive metabolic panel(!) Bili elevated [JK]  1626 CBC(!) Hgb trending downward [JK]  1627 Cxr suggest atelectasis or infection, no effusion [JK]  1813 Chest x-ray shows cardiomegaly.  No PE.  Possible mild atelectasis versus pneumonia [JK]  1813 CT angio without PE, dilated pulmonary arterial vessels noted,  stenosis of left main pulm artery, large liver mets noted, no large ascites [JK]    Clinical Course User Index [JK] Dorie Rank, MD   CT angio without PE, dilated pulmonary arterial vessels noted,  stenosis of left main pulm artery, large liver mets noted, no large ascites    CXR - atelectasis  CTA chest - without PE, dilated pulmonary arterial vessels noted, stenosis of left main pulm artery, large liver mets noted, no large ascites   There is calcifications along the main pulmonary artery with dilated pulmonary arterial vessels diffusely. There is also what appears to be a high-grade stricture or stenosis involving the origin of the left main pulmonary artery with variant enhancement of the pulmonary artery  distal to the stenosis.  Following Medications were ordered in ER: Medications  iohexol (OMNIPAQUE) 350 MG/ML injection 100 mL (100 mLs Intravenous Contrast Given 08/13/22 1713)  furosemide (LASIX) injection 40 mg (40 mg Intravenous Given 08/13/22 1851)  levofloxacin (LEVAQUIN) IVPB 750 mg (0 mg Intravenous Stopped 08/13/22 2107)    _______________________________________________________ ER Provider Called:     DrMarland Kitchen  They Recommend admit to medicine *** Will see in AM  ***SEEN in ER   ED Triage Vitals  Enc Vitals Group     BP 08/13/22 1518 114/78     Pulse Rate 08/13/22 1518 100     Resp 08/13/22 1518 (!) 24     Temp 08/13/22 1518 98.1 F (36.7 C)     Temp Source 08/13/22 1518 Oral     SpO2 08/13/22 1518 (!) 88 %     Weight 08/13/22 1523 156 lb (70.8 kg)     Height 08/13/22 1523 '5\' 10"'$  (1.778 m)     Head Circumference --      Peak Flow --      Pain Score --      Pain Loc --      Pain Edu? --      Excl. in [place]? --   TMAX(24)@     _________________________________________ Significant initial  Findings: Abnormal Labs Reviewed  COMPREHENSIVE METABOLIC PANEL - Abnormal; Notable for the following components:      Result Value   Glucose, Bld 131 (*)    Total Protein 6.2 (*)    Alkaline Phosphatase 128 (*)    Total Bilirubin 1.5 (*)    All other components within normal limits  CBC - Abnormal; Notable for the following components:   RBC 2.89 (*)    Hemoglobin 9.8 (*)    HCT 30.1 (*)    MCV 104.2 (*)    Platelets 120 (*)    nRBC 0.4 (*)    All other components within normal limits  BRAIN NATRIURETIC PEPTIDE - Abnormal; Notable for the following components:   B Natriuretic Peptide 1,164.3 (*)    All other components within normal limits     _________________________ Troponin ***ordered ECG: Ordered Personally reviewed and interpreted by me showing: HR : 96 Rhythm:  NSR,     no evidence of ischemic changes QTC 469   The recent clinical data is shown below. Vitals:    08/13/22 2100 08/13/22 2115 08/13/22 2204 08/13/22 2214  BP: 105/74 112/71  121/81  Pulse: 91 89  94  Resp:    18  Temp:    97.6 F (36.4 C)  TempSrc:    Oral  SpO2: 95% 98%  95%  Weight:   73.1 kg   Height:   '5\' 10"'$  (1.778 m)     WBC  Component Value Date/Time   WBC 5.5 08/13/2022 1546   LYMPHSABS 0.3 (L) 08/06/2022 0916   LYMPHSABS 0.2 (L) 07/25/2017 0923   MONOABS 0.9 08/06/2022 0916   MONOABS 0.7 07/25/2017 0923   EOSABS 0.1 08/06/2022 0916   EOSABS 0.1 07/25/2017 0923   BASOSABS 0.0 08/06/2022 0916   BASOSABS 0.0 07/25/2017 0923     Lactic Acid, Venous No results found for: "LATICACIDVEN"   Procalcitonin *** Ordered       UA   ordered     Results for orders placed or performed during the hospital encounter of 08/13/22  Resp panel by RT-PCR (RSV, Flu A&B, Covid) Anterior Nasal Swab     Status: None   Collection Time: 08/13/22  4:00 PM   Specimen: Anterior Nasal Swab  Result Value Ref Range Status   SARS Coronavirus 2 by RT PCR NEGATIVE NEGATIVE Final         Influenza A by PCR NEGATIVE NEGATIVE Final   Influenza B by PCR NEGATIVE NEGATIVE Final         Resp Syncytial Virus by PCR NEGATIVE NEGATIVE Final           _______________________________________________ Hospitalist was called for admission for    Pulmonary artery stenosis    Malignant neoplasm metastatic to liver [facility])    The following Work up has been ordered so far:  Orders Placed This Encounter  Procedures  . Resp panel by RT-PCR (RSV, Flu A&B, Covid) Anterior Nasal Swab  . DG Chest Port 1 View  . CT Angio Chest PE W and/or Wo Contrast  . CT ABDOMEN PELVIS W CONTRAST  . Comprehensive metabolic panel  . CBC  . Brain natriuretic peptide  . Lipase, blood  . Protime-INR  . Cardiac monitoring  . Initiate Carrier Fluid Protocol  . Cardiac Monitoring Continuous x 24 hours Indications for use: Sub-acute heart failure  . Consult to hospitalist  . Pulse oximetry, continuous  . ED EKG   . Saline lock IV  . Saline lock IV  . Place in observation (patient's expected length of stay will be less than 2 midnights)     OTHER Significant initial  Findings:  labs showing:    Recent Labs  Lab 08/13/22 1546  NA 140  K 4.3  CO2 31  GLUCOSE 131*  BUN 21  CREATININE 0.85  CALCIUM 9.0    Cr  * stable,  Up from baseline see below Lab Results  Component Value Date   CREATININE 0.85 08/13/2022   CREATININE 0.85 08/06/2022   CREATININE 1.09 07/18/2022    Recent Labs  Lab 08/13/22 1546  AST 29  ALT 20  ALKPHOS 128*  BILITOT 1.5*  PROT 6.2*  ALBUMIN 3.6   Lab Results  Component Value Date   CALCIUM 9.0 08/13/2022   PHOS 3.8 11/01/2021          Plt: Lab Results  Component Value Date   PLT 120 (L) 08/13/2022       COVID-19 Labs  No results for input(s): "DDIMER", "FERRITIN", "LDH", "CRP" in the last 72 hours.  Lab Results  Component Value Date   SARSCOV2NAA NEGATIVE 08/13/2022   [place] NEGATIVE 11/19/2020   [place] Not Detected 06/25/2019     Arterial ***Venous  Blood Gas result:  pH *** pCO2 ***; pO2 ***;     %O2 Sat ***.  ABG    Component Value Date/Time   PHART 7.345 (L) 04/16/2016 1055   PCO2ART 54.9 (H) 04/16/2016 1055  PO2ART 86.0 04/16/2016 1055   HCO3 30.0 (H) 04/16/2016 1055   TCO2 32 04/16/2016 1055   O2SAT 96.0 04/16/2016 1055         Recent Labs  Lab 08/13/22 1546  WBC 5.5  HGB 9.8*  HCT 30.1*  MCV 104.2*  PLT 120*    HG/HCT * stable,  Down *Up from baseline see below    Component Value Date/Time   HGB 9.8 (L) 08/13/2022 1546   HGB 10.9 (L) 08/06/2022 0916   HGB 15.2 07/25/2017 0923   HCT 30.1 (L) 08/13/2022 1546   HCT 45.9 07/25/2017 0923   MCV 104.2 (H) 08/13/2022 1546   MCV 96.4 07/25/2017 0923      Recent Labs  Lab 08/13/22 1546  LIPASE 38   No results for input(s): "AMMONIA" in the last 168 hours.    Cardiac Panel (last 3 results) No results for input(s): "CKTOTAL", "CKMB",  "TROPONINI", "RELINDX" in the last 72 hours.  .car BNP (last 3 results) Recent Labs    08/13/22 1546  BNP 1,164.3*      DM  labs:  HbA1C: Recent Labs    05/09/22 0857  HGBA1C 6.2       CBG (last 3)  No results for input(s): "GLUCAP" in the last 72 hours.        Cultures: No results found for: "SDES", "SPECREQUEST", "CULT", "REPTSTATUS"   Radiological Exams on Admission: CT ABDOMEN PELVIS W CONTRAST  Result Date: 08/13/2022 CLINICAL DATA:  History of liver Mets and cancer EXAM: CT ABDOMEN AND PELVIS WITH CONTRAST TECHNIQUE: Multidetector CT imaging of the abdomen and pelvis was performed using the standard protocol following bolus administration of intravenous contrast. RADIATION DOSE REDUCTION: This exam was performed according to the departmental dose-optimization program which includes automated exposure control, adjustment of the mA and/or kV according to patient size and/or use of iterative reconstruction technique. CONTRAST:  130m OMNIPAQUE IOHEXOL 350 MG/ML SOLN COMPARISON:  PET CT 07/15/2022, 03/28/2022, CT 02/29/2016 FINDINGS: Lower chest: Lung bases demonstrate small bilateral pleural effusions. Partial atelectasis or pneumonia in the right lower lobe clear cardiomegaly with aortic valve prosthesis. Similar elevation left diaphragm. Hepatobiliary: Markedly enlarged liver. Numerous hepatic metastatic lesions. Left hepatic lobe mass measures 10.1 x 10.2 cm, previously 10.1 x 10.2 cm. Dominant right hepatic mass measures 15.1 x 12.3 cm, previous 15.1 cm maximum. Inferior left hepatic mass measuring 11.2 cm compared with 11.2 cm previously. Numerous additional large hepatic masses. Pancreas: Displaced to the left upper quadrant by hepatic masses. No acute inflammatory process. Spleen: Normal in size without focal abnormality. Adrenals/Urinary Tract: Adrenal glands are within normal limits. The kidneys show no hydronephrosis. The bladder is unremarkable Stomach/Bowel: The stomach is  nonenlarged. No dilated small bowel. Bowel is displaced to the left upper quadrant by enlarged liver. No acute bowel wall thickening. Vascular/Lymphatic: Moderate aortic atherosclerosis. No aneurysm. No suspicious lymph nodes. Mass effect on hepatic vessels by multiple large liver masses. Reproductive: Prostate is slightly enlarged Other: No free air.  No significant pelvic effusion. Musculoskeletal: No acute osseous abnormality. Small foci of sclerosis within the left iliac bone without significant change IMPRESSION: 1. Markedly enlarged liver with numerous large hepatic metastatic lesions without gross interval change in size as compared with PET CT from December. 2. Small bilateral pleural effusions with partial atelectasis or pneumonia in the right lower lobe. Cardiomegaly. 3. Aortic atherosclerosis. Aortic Atherosclerosis (ICD10-I70.0). Electronically Signed   By: KDonavan FoilM.D.   On: 08/13/2022 17:52   CT Angio Chest  PE W and/or Wo Contrast  Result Date: 08/13/2022 CLINICAL DATA:  Shortness of breath for several days. Hypoxic. No neoplasm with liver metastases. Neuroendocrine tumor. On chemotherapy. EXAM: CT ANGIOGRAPHY CHEST WITH CONTRAST TECHNIQUE: Multidetector CT imaging of the chest was performed using the standard protocol during bolus administration of intravenous contrast. Multiplanar CT image reconstructions and MIPs were obtained to evaluate the vascular anatomy. RADIATION DOSE REDUCTION: This exam was performed according to the departmental dose-optimization program which includes automated exposure control, adjustment of the mA and/or kV according to patient size and/or use of iterative reconstruction technique. CONTRAST:  131m OMNIPAQUE IOHEXOL 350 MG/ML SOLN COMPARISON:  Chest x-ray earlier 08/13/2022. CT angiogram 01/17/22 older exams well. FINDINGS: Cardiovascular: The heart is enlarged. Particularly involving the right atrium with some reflux of contrast into the hepatic veins. Status  post TAVR. Normal caliber thoracic aorta otherwise with some scattered mild plaque. Small pericardial effusion. Right IJ chest port. Some enlargement of the pulmonary artery. Please correlate for any evidence of pulmonary arterial hypertension. There is a focal stricture along the proximal left main pulmonary artery. There is differences in opacification from the contrast injection proximal and distal to the stricture. Appearance suggests a high-grade stenosis. Please correlate for any known history. There are areas of peripheral arterial branches in the left lung which have suboptimal enhancement. Calcifications are seen along the main pulmonary artery origin. With the motion and the contrast enhancement no large, central pulmonary embolism identified. Mediastinum/Nodes: No specific abnormal lymph node enlargement present in the axillary region, hilum. A few small mediastinal nodes are seen, not pathologic by size criteria. Lungs/Pleura: Tiny bilateral pleural effusions are seen, right greater than left. Adjacent parenchymal opacity is identified. There are scattered ground-glass. Areas of interstitial septal thickening. More bandlike areas in the lingula likely again atelectatic or scar. The right effusion is new from previous. The adjacent opacity is also new. Diffuse breathing motion. Upper Abdomen: Please see separate dictation of the abdomen and pelvis CT from the same day. Musculoskeletal: Anasarca. Scattered degenerative changes along the spine. Review of the MIP images confirms the above findings. IMPRESSION: Status post TAVR.  Enlarged heart. There is calcifications along the main pulmonary artery with dilated pulmonary arterial vessels diffusely. There is also what appears to be a high-grade stricture or stenosis involving the origin of the left main pulmonary artery with variant enhancement of the pulmonary artery distal to the stenosis. Please correlate with any known history or further workup is  recommended. Development of a small right pleural effusion with adjacent parenchymal opacity. Atelectasis versus infiltrate. Stable tiny left effusion. Areas of interstitial septal thickening. Chest port. Please see separate dictation of abdomen and pelvis CT. Aortic Atherosclerosis (ICD10-I70.0). Electronically Signed   By: AJill SideM.D.   On: 08/13/2022 17:39   DG Chest Port 1 View  Result Date: 08/13/2022 CLINICAL DATA:  Shortness of breath. EXAM: PORTABLE CHEST 1 VIEW COMPARISON:  Chest radiograph November 19, 2020 FINDINGS: Port-A-Cath tip projects over the superior vena cava. Monitoring leads overlie the patient. Stable cardiac and mediastinal contours. Elevation left hemidiaphragm. Scattered predominately lower lung heterogeneous opacities. No pleural effusion or pneumothorax. Thoracic spine degenerative changes. IMPRESSION: Scattered lower lung heterogeneous opacities may represent atelectasis or infection. Electronically Signed   By: DLovey NewcomerM.D.   On: 08/13/2022 16:19   _______________________________________________________________________________________________________ Latest  Blood pressure 121/81, pulse 94, temperature 97.6 F (36.4 C), temperature source Oral, resp. rate 18, height '5\' 10"'$  (1.778 m), weight 73.1 kg, SpO2 95 %.  Vitals  labs and radiology finding personally reviewed  Review of Systems:    Pertinent positives include: ***  Constitutional:  No weight loss, night sweats, Fevers, chills, fatigue, weight loss  HEENT:  No headaches, Difficulty swallowing,Tooth/dental problems,Sore throat,  No sneezing, itching, ear ache, nasal congestion, post nasal drip,  Cardio-vascular:  No chest pain, Orthopnea, PND, anasarca, dizziness, palpitations.no Bilateral lower extremity swelling  GI:  No heartburn, indigestion, abdominal pain, nausea, vomiting, diarrhea, change in bowel habits, loss of appetite, melena, blood in stool, hematemesis Resp:  no shortness of breath at  rest. No dyspnea on exertion, No excess mucus, no productive cough, No non-productive cough, No coughing up of blood.No change in color of mucus.No wheezing. Skin:  no rash or lesions. No jaundice GU:  no dysuria, change in color of urine, no urgency or frequency. No straining to urinate.  No flank pain.  Musculoskeletal:  No joint pain or no joint swelling. No decreased range of motion. No back pain.  Psych:  No change in mood or affect. No depression or anxiety. No memory loss.  Neuro: no localizing neurological complaints, no tingling, no weakness, no double vision, no gait abnormality, no slurred speech, no confusion  All systems reviewed and apart from [place] all are negative _______________________________________________________________________________________________ Past Medical History:   Past Medical History:  Diagnosis Date  . Baker's cyst   . Gallstones   . Heart murmur   . Hypercholesteremia   . Hypertension   . Lesion of left lung    hx.25 yrs ago- testicilar cancer related- only scarring left after tx.-no problems now  . Liver metastasis   . 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  . Pericarditis    2nd to tumor  . Pneumonia 1990  . Primary neuroendocrine tumor of pancreas 02/14/2014   Stage IV with multiple mets to liver  . Pulmonary fibrosis ([place]) 11/08/2015  . S/P TAVR (transcatheter aortic valve replacement) 04/16/2016   26 mm Edwards Sapien 3 transcatheter heart valve placed via percutaneous right transfemoral approach   . Shortness of breath dyspnea    with exertion and when tired  . 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 Transfusion history    hx. 25 yrs ago-during cancer tx.      Past Surgical History:  Procedure Laterality Date  . BACK SURGERY     '14- rupt. disc   . CARDIAC CATHETERIZATION  N/A 01/24/2016   Procedure: Right/Left Heart Cath and Coronary Angiography;  Surgeon: [name], MD;  Location: [facility];  Service: Cardiovascular;  Laterality: N/A;  . COLONOSCOPY    . EUS N/A 02/10/2014   Procedure: UPPER ENDOSCOPIC ULTRASOUND (EUS) LINEAR;  Surgeon: [name], MD;  Location: [facility];  Service: Endoscopy;  Laterality: N/A;  . IR IMAGING GUIDED PORT INSERTION  08/02/2022  . IR RADIOLOGIST EVAL & MGMT  02/11/2017  . KNEE SURGERY Right    age 64 for Baker's cyst  . NASAL SEPTUM SURGERY     Deviated septrum  . TEE WITHOUT CARDIOVERSION N/A 04/16/2016   Procedure: TRANSESOPHAGEAL ECHOCARDIOGRAM (TEE);  Surgeon: [name], MD;  Location: [facility];  Service: Open Heart Surgery;  Laterality: N/A;  . THORACOTOMY  1990   wedge biopsy of mediastinal mass  . TRANSCATHETER AORTIC VALVE REPLACEMENT, TRANSFEMORAL N/A 04/16/2016   Procedure: TRANSCATHETER AORTIC VALVE  REPLACEMENT, TRANSFEMORAL;  Surgeon: [name], MD;  Location: [facility];  Service: Open Heart Surgery;  Laterality: N/A;    Social History:  Ambulatory   independently       reports that he has never smoked. He has never used smokeless tobacco. He reports current alcohol use. He reports that he does not use drugs.     Family History:  Family History  Problem Relation Age of Onset  . Dementia Mother   . Cancer Paternal Grandfather        esophagus  . Cancer Maternal Aunt        colon  . Emphysema Maternal Aunt    ______________________________________________________________________________________________ Allergies: Allergies  Allergen Reactions  . Penicillins Hives    ENTIRE BODY Has patient had a PCN reaction causing immediate rash, facial/tongue/throat swelling, SOB or lightheadedness with hypotension: No Has patient had a PCN reaction causing severe rash involving mucus membranes or skin necrosis: No Has patient had a PCN reaction that required hospitalization * *  YES  *  * Has patient had a PCN reaction occurring within the last 10 years: No If all of the above answers are "NO", then may proceed with Cephalosporin use. *reaction occurred when he was 17     Prior to Admission medications   Medication Sig Start Date End Date Taking? Authorizing Provider  acetaminophen (TYLENOL) 500 MG tablet Take 1,000 mg by mouth every 6 (six) hours as needed for headache.    [provider]  albuterol (VENTOLIN HFA) 108 (90 Base) MCG/ACT inhaler Inhale 1-2 puffs into the lungs every 6 (six) hours as needed for wheezing or shortness of breath. 12/26/20   [name], MD  Aromatic Inhalants (VICKS VAPOR INHALER IN) Inhale 1 spray into the lungs daily as needed (congestion).    [provider]  Cholecalciferol (VITAMIN D-3) 125 MCG (5000 UT) TABS Take 1 tablet by mouth daily in the afternoon.    [provider]  clindamycin (CLEOCIN) 300 MG capsule Take 2 capsules by mouth one hour prior to dental appointment 05/13/16   [name], MD  diphenhydrAMINE (BENADRYL) 25 MG tablet Take 25 mg by mouth at bedtime.    [provider]  Lancet Device MISC Use as instructed to test blood sugar daily E11.65 10/13/19   Shamleffer, Melanie Crazier, MD  Lancets [facility] DELICA PLUS [id number]) [place] TEST ONCE DAILY 08/20/21   Shamleffer, Melanie Crazier, MD  lidocaine-prilocaine (EMLA) cream Apply to portacath site 1 hour prior to use 08/06/22   Owens Shark, NP  loperamide (IMODIUM) 2 MG capsule Take 2 mg by mouth as needed for diarrhea or loose stools.    [provider]  losartan (COZAAR) 100 MG tablet Take 1 tablet (100 mg total) by mouth daily. 11/06/21   [name], MD  octreotide (SANDOSTATIN LAR) 30 MG injection Inject 30 mg into the muscle every 28 (twenty-eight) days.    [provider]  oxymetazoline (AFRIN) 0.05 % nasal spray Place 2 sprays into both nostrils at bedtime as needed for congestion.    [provider]   prochlorperazine (COMPAZINE) 10 MG tablet Take 1 tablet (10 mg total) by mouth every 6 (six) hours as needed for nausea or vomiting. 08/06/22   Owens Shark, NP  sildenafil (VIAGRA) 100 MG tablet TAKE ONE-HALF TO ONE TABLET BY MOUTH DAILY AS NEEDED FOR ERECTILE DYSFUNCTION 06/30/22   [name], MD  simvastatin (ZOCOR) 20 MG tablet TAKE ONE TABLET BY MOUTH DAILY  AT 8PM 11/06/21   [name], MD  tamsulosin (FLOMAX) 0.4 MG CAPS capsule Take 1 capsule (0.4 mg total) by mouth daily. 05/09/22   [name], MD    ___________________________________________________________________________________________________ Physical Exam:    08/13/2022   10:14 PM 08/13/2022   10:04 PM 08/13/2022    9:15 PM  Vitals with BMI  Height  '5\' 10"'$    Weight  161 lbs 3 oz   BMI  78.67   Systolic 672  094  Diastolic 81  71  Pulse 94  89     1. General:  in No  Acute distress   Chronically ill  -appearing 2. Psychological: Alert and   Oriented 3. Head/ENT:   Moist   Mucous Membranes                          Head Non traumatic, neck supple                            Poor Dentition 4. SKIN: decreased Skin turgor,  Skin clean Dry and intact no rash 5. Heart: Regular rate and rhythm no*** Murmur, no Rub or gallop 6. Lungs: no wheezes or crackles   7. Abdomen: Soft, ***non-tender distended bowel sounds present 8. Lower extremities: no clubbing, cyanosis,  2+ edema 9. Neurologically Grossly intact, moving all 4 extremities equally   10. MSK: Normal range of motion    Chart has been reviewed  ______________________________________________________________________________________________  Assessment/Plan  67 y.o. male with medical history significant of neuroendocrine tumor with metastases to the liver, asthma, hypertension, hyperlipidemia, BPH, chest seminoma (1990)   Admitted for    Pulmonary artery stenosis that Is symptomatic    Malignant neoplasm metastatic to liver [facility])     Present on  Admission: . Acute on chronic diastolic CHF (congestive heart failure) ([place]) . Hyperlipidemia . Primary neuroendocrine tumor of pancreas . HTN (hypertension) . Pulmonary fibrosis (HCC)    S/P TAVR (transcatheter aortic valve replacement) Followed by cardiology chronic stable repeat echogram  Hyperlipidemia Chronic stable continue Zocor 10 mg p.o. daily  Primary neuroendocrine tumor of pancreas Will let oncology know the patient has been admitted given progression of his disease.  HTN (hypertension) Allow permissive hypertension for tonight monitor blood pressure defer to cardiology regarding diuresis given pulmonary artery stenosis  Pulmonary fibrosis (HCC) Chronic in the setting of history of chest irradiation.  But could be contributing to currently shortness of breath  Type 2 diabetes mellitus without complication, without long-term current use of insulin (HCC) Mild diet controlled monitor sliding scale  Pulmonary artery stenosis This seems to be new and acquired compared to prior CT scan discussed case with pulmonology who feels that patient would be better served with IR consult versus CT surgery consult. IR consult entered.  Please consult to CT surgery in the morning if not amendable to IR intervention. Keep n.p.o. postmidnight .  Gently fluid manage Suspect acute respiratory failure and worsening shortness of breath could be contributed to this new finding. Appreciate help from cardiology with fluid management   Other plan as per orders.  DVT prophylaxis:  SCD     Code Status:    Code Status: Prior FULL CODE as per patient   I had personally discussed CODE STATUS with patient     Family Communication:   Family not at  Bedside    Disposition Plan:  Back to current facility when stable                             Following barriers for discharge:                                                     Will likely need home health, home O2,  set up                           Will need consultants to evaluate patient prior to discharge                     Would benefit from PT/OT eval prior to DC  Ordered                     Consults called: IR is aware  Admission status:  ED Disposition     ED Disposition  Admit   Condition  --   [place]: [facility] [100100]  Level of Care: Telemetry Cardiac [103]  Interfacility transfer: Yes  May place patient in observation at [facility] or [place] if equivalent level of care is available:: No  Covid Evaluation: Confirmed COVID Negative  Diagnosis: Acute on chronic diastolic CHF (congestive heart failure) [facility]) [[id number]]  Admitting Physician: Marcelyn Bruins [[id number]]  Attending Physician: Marcelyn Bruins [[id number]]           Obs    Level of care     tele  For 12H     Lab Results  Component Value Date   [place] 08/13/2022      Precautions: admitted as   Covid Negative      Jansen Sciuto 08/13/2022, 11:58 PM    [facility]     after 2 AM please page floor coverage PA If 7AM-7PM, please contact the day team taking care of the patient using [email or web address]   Patient was evaluated in the context of the global COVID-19 pandemic, which necessitated consideration that the patient might be at risk for infection with the SARS-CoV-2 virus that causes COVID-19. Institutional protocols and algorithms that pertain to the evaluation of patients at risk for COVID-19 are in a state of rapid change based on information released by regulatory bodies including the CDC and federal and state organizations. These policies and algorithms were followed during the patient's care.

## 2022-08-13 NOTE — ED Notes (Signed)
Phone report given to Janetta Hora.

## 2022-08-13 NOTE — Assessment & Plan Note (Signed)
Will let oncology know the patient has been admitted given progression of his disease.

## 2022-08-13 NOTE — Assessment & Plan Note (Signed)
Mild diet controlled monitor sliding scale

## 2022-08-13 NOTE — ED Provider Notes (Signed)
[facility] EMERGENCY DEPT Provider Note   CSN: [id number] Arrival date & time: 08/13/22  1517     History  Chief Complaint  Patient presents with   Shortness of Breath    Ronald Thompson is a 67 y.o. male.   Shortness of Breath    Patient has metastatic neuroendocrine tumor of the pancreas.  Patient states he has had it for 8 years now.  He has been told it is terminal.  Patient had an infusion of leucovorin, oxaliplatin on January 3.  Patient states chronically he has trouble where he cannot lie flat.  He has not really mentioned that to his doctors.  However in the last day or so its gotten much worse and he was not able to sleep at all.  He is feeling short of breath.  He also feels like his abdomen is becoming more swollen.  He denies any fevers.  No chest pain.  He has noticed more leg swelling. Previous records reviewed.  Last imaging in June 2023 showed multiple large liver metastases but no evidence of pulmonary embolism and a just a trace left pleural effusion.  Patient's PET scan on December 11 showed increase in size of his hepatic mass Testa-C's with decreased in metabolic activity.  The enlarging lesions are causing mass effect in the abdomen.  He also had small to moderate ascites. Home Medications Prior to Admission medications   Medication Sig Start Date End Date Taking? Authorizing Provider  acetaminophen (TYLENOL) 500 MG tablet Take 1,000 mg by mouth every 6 (six) hours as needed for headache.    [provider]  albuterol (VENTOLIN HFA) 108 (90 Base) MCG/ACT inhaler Inhale 1-2 puffs into the lungs every 6 (six) hours as needed for wheezing or shortness of breath. 12/26/20   [name], MD  Aromatic Inhalants (VICKS VAPOR INHALER IN) Inhale 1 spray into the lungs daily as needed (congestion).    [provider]  Cholecalciferol (VITAMIN D-3) 125 MCG (5000 UT) TABS Take 1 tablet by mouth daily in the afternoon.    [provider]   clindamycin (CLEOCIN) 300 MG capsule Take 2 capsules by mouth one hour prior to dental appointment 05/13/16   [name], MD  diphenhydrAMINE (BENADRYL) 25 MG tablet Take 25 mg by mouth at bedtime.    [provider]  Lancet Device MISC Use as instructed to test blood sugar daily E11.65 10/13/19   Shamleffer, Melanie Crazier, MD  Lancets [facility] DELICA PLUS [id number]) [place] TEST ONCE DAILY 08/20/21   Shamleffer, Melanie Crazier, MD  lidocaine-prilocaine (EMLA) cream Apply to portacath site 1 hour prior to use 08/06/22   Owens Shark, NP  loperamide (IMODIUM) 2 MG capsule Take 2 mg by mouth as needed for diarrhea or loose stools.    [provider]  losartan (COZAAR) 100 MG tablet Take 1 tablet (100 mg total) by mouth daily. 11/06/21   [name], MD  octreotide (SANDOSTATIN LAR) 30 MG injection Inject 30 mg into the muscle every 28 (twenty-eight) days.    [provider]  oxymetazoline (AFRIN) 0.05 % nasal spray Place 2 sprays into both nostrils at bedtime as needed for congestion.    [provider]  prochlorperazine (COMPAZINE) 10 MG tablet Take 1 tablet (10 mg total) by mouth every 6 (six) hours as needed for nausea or vomiting. 08/06/22   Owens Shark, NP  sildenafil (VIAGRA) 100 MG tablet TAKE ONE-HALF TO ONE TABLET BY MOUTH DAILY AS NEEDED FOR  ERECTILE DYSFUNCTION 06/30/22   [name], MD  simvastatin (ZOCOR) 20 MG tablet TAKE ONE TABLET BY MOUTH DAILY AT 8PM 11/06/21   [name], MD  tamsulosin (FLOMAX) 0.4 MG CAPS capsule Take 1 capsule (0.4 mg total) by mouth daily. 05/09/22   [name], MD      Allergies    Penicillins    Review of Systems   Review of Systems  Respiratory:  Positive for shortness of breath.     Physical Exam Updated Vital Signs BP 126/74   Pulse 100   Temp 98.1 F (36.7 C) (Oral)   Resp 16   Ht 1.778 m ('5\' 10"'$ )   Wt 70.8 kg   SpO2 94%   BMI 22.38 kg/m  Physical Exam Vitals and nursing note reviewed.   Constitutional:      General: He is not in acute distress.    Appearance: He is well-developed.  HENT:     Head: Normocephalic and atraumatic.     Right Ear: External ear normal.     Left Ear: External ear normal.  Eyes:     General: No scleral icterus.       Right eye: No discharge.        Left eye: No discharge.     Conjunctiva/sclera: Conjunctivae normal.  Neck:     Trachea: No tracheal deviation.  Cardiovascular:     Rate and Rhythm: Normal rate and regular rhythm.  Pulmonary:     Effort: Pulmonary effort is normal. No respiratory distress.     Breath sounds: No stridor. Rhonchi and rales present. No wheezing.  Abdominal:     General: Bowel sounds are normal. There is distension.     Palpations: Abdomen is soft.     Tenderness: There is no abdominal tenderness. There is no guarding or rebound.  Musculoskeletal:        General: No tenderness or deformity.     Cervical back: Neck supple.     Right lower leg: Edema present.     Left lower leg: Edema present.  Skin:    General: Skin is warm and dry.     Findings: No rash.  Neurological:     General: No focal deficit present.     Mental Status: He is alert.     Cranial Nerves: No cranial nerve deficit, dysarthria or facial asymmetry.     Sensory: No sensory deficit.     Motor: No abnormal muscle tone or seizure activity.     Coordination: Coordination normal.  Psychiatric:        Mood and Affect: Mood normal.     ED Results / Procedures / Treatments   Labs (all labs ordered are listed, but only abnormal results are displayed) Labs Reviewed  COMPREHENSIVE METABOLIC PANEL - Abnormal; Notable for the following components:      Result Value   Glucose, Bld 131 (*)    Total Protein 6.2 (*)    Alkaline Phosphatase 128 (*)    Total Bilirubin 1.5 (*)    All other components within normal limits  CBC - Abnormal; Notable for the following components:   RBC 2.89 (*)    Hemoglobin 9.8 (*)    HCT 30.1 (*)    MCV 104.2 (*)     Platelets 120 (*)    nRBC 0.4 (*)    All other components within normal limits  BRAIN NATRIURETIC PEPTIDE - Abnormal; Notable for the following components:   B Natriuretic Peptide 1,164.3 (*)  All other components within normal limits  RESP PANEL BY RT-PCR (RSV, FLU A&B, COVID)  RVPGX2  LIPASE, BLOOD  PROTIME-INR    EKG EKG Interpretation  Date/Time:  Tuesday August 13 2022 16:09:47 EST Ventricular Rate:  96 PR Interval:  150 QRS Duration: 88 QT Interval:  371 QTC Calculation: 469 R Axis:   90 Text Interpretation: Sinus rhythm Consider right atrial enlargement Borderline right axis deviation No significant change since last tracing Confirmed by Dorie Rank [phone]) on 08/13/2022 4:15:45 PM  Radiology CT ABDOMEN PELVIS W CONTRAST  Result Date: 08/13/2022 CLINICAL DATA:  History of liver Mets and cancer EXAM: CT ABDOMEN AND PELVIS WITH CONTRAST TECHNIQUE: Multidetector CT imaging of the abdomen and pelvis was performed using the standard protocol following bolus administration of intravenous contrast. RADIATION DOSE REDUCTION: This exam was performed according to the departmental dose-optimization program which includes automated exposure control, adjustment of the mA and/or kV according to patient size and/or use of iterative reconstruction technique. CONTRAST:  129m OMNIPAQUE IOHEXOL 350 MG/ML SOLN COMPARISON:  PET CT 07/15/2022, 03/28/2022, CT 02/29/2016 FINDINGS: Lower chest: Lung bases demonstrate small bilateral pleural effusions. Partial atelectasis or pneumonia in the right lower lobe clear cardiomegaly with aortic valve prosthesis. Similar elevation left diaphragm. Hepatobiliary: Markedly enlarged liver. Numerous hepatic metastatic lesions. Left hepatic lobe mass measures 10.1 x 10.2 cm, previously 10.1 x 10.2 cm. Dominant right hepatic mass measures 15.1 x 12.3 cm, previous 15.1 cm maximum. Inferior left hepatic mass measuring 11.2 cm compared with 11.2 cm previously. Numerous  additional large hepatic masses. Pancreas: Displaced to the left upper quadrant by hepatic masses. No acute inflammatory process. Spleen: Normal in size without focal abnormality. Adrenals/Urinary Tract: Adrenal glands are within normal limits. The kidneys show no hydronephrosis. The bladder is unremarkable Stomach/Bowel: The stomach is nonenlarged. No dilated small bowel. Bowel is displaced to the left upper quadrant by enlarged liver. No acute bowel wall thickening. Vascular/Lymphatic: Moderate aortic atherosclerosis. No aneurysm. No suspicious lymph nodes. Mass effect on hepatic vessels by multiple large liver masses. Reproductive: Prostate is slightly enlarged Other: No free air.  No significant pelvic effusion. Musculoskeletal: No acute osseous abnormality. Small foci of sclerosis within the left iliac bone without significant change IMPRESSION: 1. Markedly enlarged liver with numerous large hepatic metastatic lesions without gross interval change in size as compared with PET CT from December. 2. Small bilateral pleural effusions with partial atelectasis or pneumonia in the right lower lobe. Cardiomegaly. 3. Aortic atherosclerosis. Aortic Atherosclerosis (ICD10-I70.0). Electronically Signed   By: KDonavan FoilM.D.   On: 08/13/2022 17:52   CT Angio Chest PE W and/or Wo Contrast  Result Date: 08/13/2022 CLINICAL DATA:  Shortness of breath for several days. Hypoxic. No neoplasm with liver metastases. Neuroendocrine tumor. On chemotherapy. EXAM: CT ANGIOGRAPHY CHEST WITH CONTRAST TECHNIQUE: Multidetector CT imaging of the chest was performed using the standard protocol during bolus administration of intravenous contrast. Multiplanar CT image reconstructions and MIPs were obtained to evaluate the vascular anatomy. RADIATION DOSE REDUCTION: This exam was performed according to the departmental dose-optimization program which includes automated exposure control, adjustment of the mA and/or kV according to patient  size and/or use of iterative reconstruction technique. CONTRAST:  1069mOMNIPAQUE IOHEXOL 350 MG/ML SOLN COMPARISON:  Chest x-ray earlier 08/13/2022. CT angiogram 01/17/22 older exams well. FINDINGS: Cardiovascular: The heart is enlarged. Particularly involving the right atrium with some reflux of contrast into the hepatic veins. Status post TAVR. Normal caliber thoracic aorta otherwise with some scattered mild plaque.  Small pericardial effusion. Right IJ chest port. Some enlargement of the pulmonary artery. Please correlate for any evidence of pulmonary arterial hypertension. There is a focal stricture along the proximal left main pulmonary artery. There is differences in opacification from the contrast injection proximal and distal to the stricture. Appearance suggests a high-grade stenosis. Please correlate for any known history. There are areas of peripheral arterial branches in the left lung which have suboptimal enhancement. Calcifications are seen along the main pulmonary artery origin. With the motion and the contrast enhancement no large, central pulmonary embolism identified. Mediastinum/Nodes: No specific abnormal lymph node enlargement present in the axillary region, hilum. A few small mediastinal nodes are seen, not pathologic by size criteria. Lungs/Pleura: Tiny bilateral pleural effusions are seen, right greater than left. Adjacent parenchymal opacity is identified. There are scattered ground-glass. Areas of interstitial septal thickening. More bandlike areas in the lingula likely again atelectatic or scar. The right effusion is new from previous. The adjacent opacity is also new. Diffuse breathing motion. Upper Abdomen: Please see separate dictation of the abdomen and pelvis CT from the same day. Musculoskeletal: Anasarca. Scattered degenerative changes along the spine. Review of the MIP images confirms the above findings. IMPRESSION: Status post TAVR.  Enlarged heart. There is calcifications along  the main pulmonary artery with dilated pulmonary arterial vessels diffusely. There is also what appears to be a high-grade stricture or stenosis involving the origin of the left main pulmonary artery with variant enhancement of the pulmonary artery distal to the stenosis. Please correlate with any known history or further workup is recommended. Development of a small right pleural effusion with adjacent parenchymal opacity. Atelectasis versus infiltrate. Stable tiny left effusion. Areas of interstitial septal thickening. Chest port. Please see separate dictation of abdomen and pelvis CT. Aortic Atherosclerosis (ICD10-I70.0). Electronically Signed   By: Jill Side M.D.   On: 08/13/2022 17:39   DG Chest Port 1 View  Result Date: 08/13/2022 CLINICAL DATA:  Shortness of breath. EXAM: PORTABLE CHEST 1 VIEW COMPARISON:  Chest radiograph November 19, 2020 FINDINGS: Port-A-Cath tip projects over the superior vena cava. Monitoring leads overlie the patient. Stable cardiac and mediastinal contours. Elevation left hemidiaphragm. Scattered predominately lower lung heterogeneous opacities. No pleural effusion or pneumothorax. Thoracic spine degenerative changes. IMPRESSION: Scattered lower lung heterogeneous opacities may represent atelectasis or infection. Electronically Signed   By: Lovey Newcomer M.D.   On: 08/13/2022 16:19    Procedures Procedures    Medications Ordered in ED Medications  levofloxacin (LEVAQUIN) IVPB 750 mg (750 mg Intravenous New Bag/Given 08/13/22 1857)  iohexol (OMNIPAQUE) 350 MG/ML injection 100 mL (100 mLs Intravenous Contrast Given 08/13/22 1713)  furosemide (LASIX) injection 40 mg (40 mg Intravenous Given 08/13/22 1851)    ED Course/ Medical Decision Making/ A&P Clinical Course as of 08/13/22 1932  Tue Aug 13, 2022  1623 Brain natriuretic peptide(!) Elevated at 1164 [JK]  1625 Comprehensive metabolic panel(!) Bili elevated [JK]  1626 CBC(!) Hgb trending downward [JK]  1627 Cxr suggest  atelectasis or infection, no effusion [JK]  1813 Chest x-ray shows cardiomegaly.  No PE.  Possible mild atelectasis versus pneumonia [JK]  1813 CT angio without PE, dilated pulmonary arterial vessels noted,  stenosis of left main pulm artery, large liver mets noted, no large ascites [JK]    Clinical Course User Index [JK] Dorie Rank, MD  Medical Decision Making Differential diagnosis includes but not limited to pleural effusions, pneumonia, CHF, pneumonia, pulmonary embolism  Problems Addressed: Acute on chronic congestive heart failure, unspecified heart failure type [facility]): acute illness or injury that poses a threat to life or bodily functions Malignant neoplasm metastatic to liver [facility]): chronic illness or injury with exacerbation, progression, or side effects of treatment Pulmonary artery stenosis: chronic illness or injury  Amount and/or Complexity of Data Reviewed Labs: ordered. Decision-making details documented in ED Course. Radiology: ordered.  Risk Prescription drug management. Decision regarding hospitalization.   Patient presented to the ED for evaluation of worsening shortness of breath.  Patient describes orthopnea and does have a new oxygen requirement.  He has known metastatic cancer with liver mets.  Labs plain films and CT scans ordered.  No signs of pulmonary embolism.  There is questionable pneumonia versus atelectasis.  Patient also has cardiomegaly and laboratory test suggesting possible CHF exacerbation with elevated BNP.  Clinically he does have evidence of peripheral edema and his symptoms of orthopnea and paroxysmal dyspnea suggest more of a CHF exacerbation rather than acute infection.  However I will cover him with antibiotics and I have also ordered diuretics.  CT scan also shows findings of possible stricture at the left pulmonary artery.  Patient believes this may be residual from his prior valvular surgery.    Patient will require  admission to the hospital for further treatment and evaluation.  I will consult the medical service to arrange transfer and admission.  Case discussed with Dr Trilby Drummer        Final Clinical Impression(s) / ED Diagnoses Final diagnoses:  Acute on chronic congestive heart failure, unspecified heart failure type [facility])  Pulmonary artery stenosis  Malignant neoplasm metastatic to liver [facility])    Rx / DC Orders ED Discharge Orders     None         Dorie Rank, MD 08/13/22 1932

## 2022-08-14 ENCOUNTER — Observation Stay (HOSPITAL_BASED_OUTPATIENT_CLINIC_OR_DEPARTMENT_OTHER): Payer: Medicare Other

## 2022-08-14 DIAGNOSIS — I370 Nonrheumatic pulmonary valve stenosis: Secondary | ICD-10-CM | POA: Diagnosis not present

## 2022-08-14 DIAGNOSIS — I5033 Acute on chronic diastolic (congestive) heart failure: Secondary | ICD-10-CM | POA: Diagnosis present

## 2022-08-14 DIAGNOSIS — J9601 Acute respiratory failure with hypoxia: Secondary | ICD-10-CM | POA: Diagnosis present

## 2022-08-14 DIAGNOSIS — R7989 Other specified abnormal findings of blood chemistry: Secondary | ICD-10-CM | POA: Diagnosis present

## 2022-08-14 DIAGNOSIS — Q256 Stenosis of pulmonary artery: Secondary | ICD-10-CM | POA: Diagnosis not present

## 2022-08-14 DIAGNOSIS — R188 Other ascites: Secondary | ICD-10-CM | POA: Diagnosis present

## 2022-08-14 DIAGNOSIS — I379 Nonrheumatic pulmonary valve disorder, unspecified: Secondary | ICD-10-CM

## 2022-08-14 DIAGNOSIS — I3139 Other pericardial effusion (noninflammatory): Secondary | ICD-10-CM | POA: Diagnosis present

## 2022-08-14 DIAGNOSIS — J8410 Pulmonary fibrosis, unspecified: Secondary | ICD-10-CM | POA: Diagnosis present

## 2022-08-14 DIAGNOSIS — E7800 Pure hypercholesterolemia, unspecified: Secondary | ICD-10-CM | POA: Diagnosis present

## 2022-08-14 DIAGNOSIS — I272 Pulmonary hypertension, unspecified: Secondary | ICD-10-CM | POA: Diagnosis present

## 2022-08-14 DIAGNOSIS — J45909 Unspecified asthma, uncomplicated: Secondary | ICD-10-CM | POA: Diagnosis present

## 2022-08-14 DIAGNOSIS — I342 Nonrheumatic mitral (valve) stenosis: Secondary | ICD-10-CM | POA: Diagnosis not present

## 2022-08-14 DIAGNOSIS — N179 Acute kidney failure, unspecified: Secondary | ICD-10-CM | POA: Diagnosis present

## 2022-08-14 DIAGNOSIS — Z86718 Personal history of other venous thrombosis and embolism: Secondary | ICD-10-CM | POA: Diagnosis not present

## 2022-08-14 DIAGNOSIS — I509 Heart failure, unspecified: Secondary | ICD-10-CM | POA: Diagnosis not present

## 2022-08-14 DIAGNOSIS — N400 Enlarged prostate without lower urinary tract symptoms: Secondary | ICD-10-CM | POA: Diagnosis present

## 2022-08-14 DIAGNOSIS — C7A8 Other malignant neuroendocrine tumors: Secondary | ICD-10-CM | POA: Diagnosis present

## 2022-08-14 DIAGNOSIS — R0602 Shortness of breath: Secondary | ICD-10-CM | POA: Diagnosis present

## 2022-08-14 DIAGNOSIS — I700 Atherosclerosis of aorta: Secondary | ICD-10-CM | POA: Diagnosis present

## 2022-08-14 DIAGNOSIS — Z79899 Other long term (current) drug therapy: Secondary | ICD-10-CM | POA: Diagnosis not present

## 2022-08-14 DIAGNOSIS — E119 Type 2 diabetes mellitus without complications: Secondary | ICD-10-CM | POA: Diagnosis present

## 2022-08-14 DIAGNOSIS — Z1152 Encounter for screening for COVID-19: Secondary | ICD-10-CM | POA: Diagnosis not present

## 2022-08-14 DIAGNOSIS — Y842 Radiological procedure and radiotherapy as the cause of abnormal reaction of the patient, or of later complication, without mention of misadventure at the time of the procedure: Secondary | ICD-10-CM | POA: Diagnosis present

## 2022-08-14 DIAGNOSIS — I110 Hypertensive heart disease with heart failure: Secondary | ICD-10-CM | POA: Diagnosis present

## 2022-08-14 DIAGNOSIS — Z8547 Personal history of malignant neoplasm of testis: Secondary | ICD-10-CM | POA: Diagnosis not present

## 2022-08-14 DIAGNOSIS — Z953 Presence of xenogenic heart valve: Secondary | ICD-10-CM | POA: Diagnosis not present

## 2022-08-14 DIAGNOSIS — I2510 Atherosclerotic heart disease of native coronary artery without angina pectoris: Secondary | ICD-10-CM | POA: Diagnosis present

## 2022-08-14 DIAGNOSIS — I088 Other rheumatic multiple valve diseases: Secondary | ICD-10-CM | POA: Diagnosis present

## 2022-08-14 DIAGNOSIS — Z923 Personal history of irradiation: Secondary | ICD-10-CM | POA: Diagnosis not present

## 2022-08-14 DIAGNOSIS — C7B8 Other secondary neuroendocrine tumors: Secondary | ICD-10-CM | POA: Diagnosis present

## 2022-08-14 LAB — ECHOCARDIOGRAM COMPLETE
AR max vel: 3.11 cm2
AV Area VTI: 3.1 cm2
AV Area mean vel: 3.06 cm2
AV Mean grad: 5.2 mmHg
AV Peak grad: 10.2 mmHg
Ao pk vel: 1.6 m/s
Area-P 1/2: 2.91 cm2
Height: 70 in
MV VTI: 1.57 cm2
S' Lateral: 2.3 cm
Weight: 2579.2 oz

## 2022-08-14 LAB — TROPONIN I (HIGH SENSITIVITY)
Troponin I (High Sensitivity): 33 ng/L — ABNORMAL HIGH (ref <18)
Troponin I (High Sensitivity): 30 ng/L — ABNORMAL HIGH (ref <18)
Troponin I (High Sensitivity): 30 ng/L — ABNORMAL HIGH (ref <18)

## 2022-08-14 LAB — BASIC METABOLIC PANEL
Anion gap: 10 (ref 5–15)
BUN: 22 mg/dL (ref 8–23)
CO2: 33 mmol/L — ABNORMAL HIGH (ref 22–32)
Calcium: 9.2 mg/dL (ref 8.9–10.3)
Chloride: 96 mmol/L — ABNORMAL LOW (ref 98–111)
Creatinine, Ser: 1.43 mg/dL — ABNORMAL HIGH (ref 0.61–1.24)
GFR, Estimated: 53 mL/min — ABNORMAL LOW (ref >60)
Glucose, Bld: 135 mg/dL — ABNORMAL HIGH (ref 70–99)
Potassium: 4.4 mmol/L (ref 3.5–5.1)
Sodium: 139 mmol/L (ref 135–145)

## 2022-08-14 LAB — GLUCOSE, CAPILLARY
Glucose-Capillary: 110 mg/dL — ABNORMAL HIGH (ref 70–99)
Glucose-Capillary: 99 mg/dL (ref 70–99)
Glucose-Capillary: 88 mg/dL (ref 70–99)
Glucose-Capillary: 141 mg/dL — ABNORMAL HIGH (ref 70–99)

## 2022-08-14 LAB — COMPREHENSIVE METABOLIC PANEL
ALT: 24 U/L (ref 0–44)
AST: 37 U/L (ref 15–41)
Albumin: 3.3 g/dL — ABNORMAL LOW (ref 3.5–5.0)
Alkaline Phosphatase: 145 U/L — ABNORMAL HIGH (ref 38–126)
Anion gap: 10 (ref 5–15)
BUN: 17 mg/dL (ref 8–23)
CO2: 32 mmol/L (ref 22–32)
Calcium: 9.1 mg/dL (ref 8.9–10.3)
Chloride: 96 mmol/L — ABNORMAL LOW (ref 98–111)
Creatinine, Ser: 1.09 mg/dL (ref 0.61–1.24)
GFR, Estimated: >60 mL/min (ref >60)
Glucose, Bld: 110 mg/dL — ABNORMAL HIGH (ref 70–99)
Potassium: 4.1 mmol/L (ref 3.5–5.1)
Sodium: 138 mmol/L (ref 135–145)
Total Bilirubin: 1.8 mg/dL — ABNORMAL HIGH (ref 0.3–1.2)
Total Protein: 6.3 g/dL — ABNORMAL LOW (ref 6.5–8.1)

## 2022-08-14 LAB — LACTIC ACID, PLASMA
Lactic Acid, Venous: 1.4 mmol/L (ref 0.5–1.9)
Lactic Acid, Venous: 1.5 mmol/L (ref 0.5–1.9)

## 2022-08-14 LAB — CBC WITH DIFFERENTIAL/PLATELET
Abs Immature Granulocytes: 0.03 10*3/uL (ref 0.00–0.07)
Basophils Absolute: 0 10*3/uL (ref 0.0–0.1)
Basophils Relative: 1 %
Eosinophils Absolute: 0 10*3/uL (ref 0.0–0.5)
Eosinophils Relative: 1 %
HCT: 30 % — ABNORMAL LOW (ref 39.0–52.0)
Hemoglobin: 10.2 g/dL — ABNORMAL LOW (ref 13.0–17.0)
Immature Granulocytes: 1 %
Lymphocytes Relative: 6 %
Lymphs Abs: 0.3 10*3/uL — ABNORMAL LOW (ref 0.7–4.0)
MCH: 34.7 pg — ABNORMAL HIGH (ref 26.0–34.0)
MCHC: 34 g/dL (ref 30.0–36.0)
MCV: 102 fL — ABNORMAL HIGH (ref 80.0–100.0)
Monocytes Absolute: 0.7 10*3/uL (ref 0.1–1.0)
Monocytes Relative: 13 %
Neutro Abs: 4.1 10*3/uL (ref 1.7–7.7)
Neutrophils Relative %: 78 %
Platelets: 121 10*3/uL — ABNORMAL LOW (ref 150–400)
RBC: 2.94 MIL/uL — ABNORMAL LOW (ref 4.22–5.81)
RDW: 13.9 % (ref 11.5–15.5)
WBC: 5.2 10*3/uL (ref 4.0–10.5)
nRBC: 0.4 % — ABNORMAL HIGH (ref 0.0–0.2)

## 2022-08-14 LAB — APTT: aPTT: 36 seconds (ref 24–36)

## 2022-08-14 LAB — PROTIME-INR
INR: 1.2 (ref 0.8–1.2)
Prothrombin Time: 15.2 seconds (ref 11.4–15.2)

## 2022-08-14 LAB — PROCALCITONIN: Procalcitonin: 0.22 ng/mL

## 2022-08-14 LAB — TSH: TSH: 1.98 u[IU]/mL (ref 0.350–4.500)

## 2022-08-14 LAB — PHOSPHORUS: Phosphorus: 3.5 mg/dL (ref 2.5–4.6)

## 2022-08-14 LAB — MAGNESIUM: Magnesium: 1.8 mg/dL (ref 1.7–2.4)

## 2022-08-14 LAB — HIV ANTIBODY (ROUTINE TESTING W REFLEX): HIV Screen 4th Generation wRfx: NONREACTIVE

## 2022-08-14 MED ORDER — FUROSEMIDE 10 MG/ML IJ SOLN
40.0000 mg | Freq: Two times a day (BID) | INTRAMUSCULAR | Status: DC
  Administered 2022-08-14: 40 mg via INTRAVENOUS
  Filled 2022-08-14: qty 4

## 2022-08-14 MED ORDER — OXYMETAZOLINE HCL 0.05 % NA SOLN
1.0000 | Freq: Two times a day (BID) | NASAL | Status: DC
  Administered 2022-08-14 – 2022-08-15 (×2): 1 via NASAL
  Filled 2022-08-14: qty 30

## 2022-08-14 MED ORDER — ADULT MULTIVITAMIN W/MINERALS CH
1.0000 | ORAL_TABLET | Freq: Every day | ORAL | Status: DC
  Administered 2022-08-15: 1 via ORAL
  Filled 2022-08-14: qty 1

## 2022-08-14 MED ORDER — ENSURE ENLIVE PO LIQD
237.0000 mL | Freq: Two times a day (BID) | ORAL | Status: DC
  Administered 2022-08-14 – 2022-08-15 (×2): 237 mL via ORAL

## 2022-08-14 NOTE — Assessment & Plan Note (Signed)
this patient has acute respiratory failure with Hypoxia  as documented by the presence of following: O2 saturatio< 90% on RA  Likely due to: pulmonary stenosis, pulmonary HTN Provide O2 therapy and titrate as needed  Continuous pulse ox   check Pulse ox with ambulation prior to discharge   may need  TC consult for home O2 set up

## 2022-08-14 NOTE — Progress Notes (Signed)
PROGRESS NOTE    Ronald Thompson  FYB:[id number] DOB: August 19, 1955 DOA: 08/13/2022 PCP: [name], MD  Chief Complaint  Patient presents with   Shortness of Breath    Brief Narrative:  Ronald Thompson is Ronald Thompson 67 y.o. male with medical history significant of neuroendocrine tumor with metastases to the liver, asthma, hypertension, hyperlipidemia, BPH, chest seminoma (1990) presenting with worsening SOB.  Assessment & Plan:   Principal Problem:   Acute on chronic diastolic CHF (congestive heart failure) (HCC) Active Problems:   S/P TAVR (transcatheter aortic valve replacement)   Hyperlipidemia   Primary neuroendocrine tumor of pancreas   Pulmonary fibrosis (HCC)   HTN (hypertension)   Pulmonary artery stenosis   Acute respiratory failure with hypoxia (HCC)   Elevated troponin   Pulmonary hypertension (HCC)  Acute on chronic diastolic heart failure Pulmonary Hypertension Improved with lasix CXR at presentation with lower lung heterogenous opacities CT PE protocol with high grade stenosis or stricture involving origin of L main pulm artery, interstitial septal thickening Additional dose of lasix given today - will follow up renal function, volume status prior to ordering lasix 1/11 Cards c/s, appreciate recs Echo with preserved EF, flatted IV septum in systole, RVSF mildly reduced, severely elevated PASP, grade III atheroma  Pulmonary Artery Stenosis Thought post radiation, appreciate IR eval, he's high risk for angioplasty and/or stenting No plans for any procedure at this time  Acute Kidney Injury Worsened in setting of diarrhea Trend   Metastatic Neuroendocrine Tumor Chest Seminoma 1990 treated with BEP and chest radiation at [facility] Cycle 1 folfox 08/07/2022 Oncology added to treatment team Of note, has had chronic exertional dyspnea since treatment for seminoma  S/p TAVR Per cards  Dyslipidemia Continue simvastatin  BPH Flomax     DVT prophylaxis: SCD Code Status:  full Family Communication: none Disposition:   Status is: Inpatient Remains inpatient appropriate because: continued need for diuresis   Consultants:  Cardiology Oncology added to treatment team  Procedures:  Echo IMPRESSIONS     1. Left ventricular ejection fraction, by estimation, is 65 to 70%. The  left ventricle has normal function. The left ventricle has no regional  wall motion abnormalities. There is moderate concentric left ventricular  hypertrophy. Left ventricular  diastolic parameters are indeterminate. There is the interventricular  septum is flattened in systole, consistent with right ventricular pressure  overload.   2. Right ventricular systolic function is mildly reduced. The right  ventricular size is severely enlarged. There is severely elevated  pulmonary artery systolic pressure. The estimated right ventricular  systolic pressure is 52.7 mmHg.   3. Right atrial size was mild to moderately dilated.   4. The mitral valve is degenerative. Mild mitral valve regurgitation.  Severe mitral stenosis. The mean mitral valve gradient is 10.0 mmHg with  average heart rate of 89 bpm. Severe mitral annular calcification.   5. Tricuspid valve regurgitation is severe.   6. The aortic valve has been repaired/replaced. Aortic valve  regurgitation is trivial, valvular and paravalvular. There is Envy Meno 26 mm  Sapien prosthetic (TAVR) valve present in the aortic position. Procedure  Date: 04/16/2016. Aortic valve area, by VTI  measures 3.10 cm. Aortic valve mean gradient measures 5.2 mmHg. Aortic  valve Vmax measures 1.60 m/s.   7. Pulmonic valve regurgitation is moderate.   8. There is Moderate (Grade III) atheroma plaque involving the ascending  aorta.   9. The inferior vena cava is normal in size with <50% respiratory  variability, suggesting right atrial  pressure of 8 mmHg.   Antimicrobials:  Anti-infectives (From admission, onward)    Start     Dose/Rate Route Frequency  Ordered Stop   08/13/22 1830  levofloxacin (LEVAQUIN) IVPB 750 mg        750 mg 100 mL/hr over 90 Minutes Intravenous  Once 08/13/22 1829 08/13/22 2107       Subjective: Feels better after dose of lasix  Objective: Vitals:   08/14/22 0432 08/14/22 0816 08/14/22 1213 08/14/22 1603  BP: 122/89 134/74 121/76 110/68  Pulse: 95 91 93 (!) 106  Resp: '18 17 18 16  '$ Temp: 98.5 F (36.9 C) 97.8 F (36.6 C) 98 F (36.7 C) 97.6 F (36.4 C)  TempSrc: Oral Oral Oral Oral  SpO2: 97% 92%  95%  Weight:      Height:        Intake/Output Summary (Last 24 hours) at 08/14/2022 1816 Last data filed at 08/14/2022 1604 Gross per 24 hour  Intake 150 ml  Output 2700 ml  Net -2550 ml   Filed Weights   08/13/22 1523 08/13/22 2204  Weight: 70.8 kg 73.1 kg    Examination:  General exam: Appears calm and comfortable  Respiratory system: bibasilar crackles Cardiovascular system: RRR Gastrointestinal system: Abdomen is nondistended, soft and nontender. Central nervous system: Alert and oriented. No focal neurological deficits. Extremities: trace LE edema  Data Reviewed: I have personally reviewed following labs and imaging studies  CBC: Recent Labs  Lab 08/13/22 1546 08/13/22 2315  WBC 5.5 5.2  NEUTROABS  --  4.1  HGB 9.8* 10.2*  HCT 30.1* 30.0*  MCV 104.2* 102.0*  PLT 120* 121*    Basic Metabolic Panel: Recent Labs  Lab 08/13/22 1546 08/13/22 2315 08/14/22 1610  NA 140 138 139  K 4.3 4.1 4.4  CL 101 96* 96*  CO2 31 32 33*  GLUCOSE 131* 110* 135*  BUN '21 17 22  '$ CREATININE 0.85 1.09 1.43*  CALCIUM 9.0 9.1 9.2  MG  --  1.8  --   PHOS  --  3.5  --     GFR: Estimated Creatinine Clearance: 51 mL/min (Jermani Eberlein) (by C-G formula based on SCr of 1.43 mg/dL (H)).  Liver Function Tests: Recent Labs  Lab 08/13/22 1546 08/13/22 2315  AST 29 37  ALT 20 24  ALKPHOS 128* 145*  BILITOT 1.5* 1.8*  PROT 6.2* 6.3*  ALBUMIN 3.6 3.3*    CBG: Recent Labs  Lab 08/14/22 0434  08/14/22 0813 08/14/22 1212 08/14/22 1601  GLUCAP 110* 99 88 141*     Recent Results (from the past 240 hour(s))  Resp panel by RT-PCR (RSV, Flu Ayriel Texidor&B, Covid) Anterior Nasal Swab     Status: None   Collection Time: 08/13/22  4:00 PM   Specimen: Anterior Nasal Swab  Result Value Ref Range Status   SARS Coronavirus 2 by RT PCR NEGATIVE NEGATIVE Final    Comment: (NOTE) SARS-CoV-2 target nucleic acids are NOT DETECTED.  The SARS-CoV-2 RNA is generally detectable in upper respiratory specimens during the acute phase of infection. The lowest concentration of SARS-CoV-2 viral copies this assay can detect is 138 copies/mL. Fortino Haag negative result does not preclude SARS-Cov-2 infection and should not be used as the sole basis for treatment or other patient management decisions. Bleu Minerd negative result may occur with  improper specimen collection/handling, submission of specimen other than nasopharyngeal swab, presence of viral mutation(s) within the areas targeted by this assay, and inadequate number of viral copies(<138 copies/mL). Vivi Piccirilli negative result  must be combined with clinical observations, patient history, and epidemiological information. The expected result is Negative.  Fact Sheet for Patients:  EntrepreneurPulse.com.au  Fact Sheet for Healthcare Providers:  IncredibleEmployment.be  This test is no t yet approved or cleared by the [place] FDA and  has been authorized for detection and/or diagnosis of SARS-CoV-2 by FDA under an Emergency Use Authorization (EUA). This EUA will remain  in effect (meaning this test can be used) for the duration of the COVID-19 declaration under Section 564(b)(1) of the Act, 21 U.S.C.section 360bbb-3(b)(1), unless the authorization is terminated  or revoked sooner.       Influenza Estill Llerena by PCR NEGATIVE NEGATIVE Final   Influenza B by PCR NEGATIVE NEGATIVE Final    Comment: (NOTE) The Xpert Xpress SARS-CoV-2/FLU/RSV  plus assay is intended as an aid in the diagnosis of influenza from Nasopharyngeal swab specimens and should not be used as Abishai Viegas sole basis for treatment. Nasal washings and aspirates are unacceptable for Xpert Xpress SARS-CoV-2/FLU/RSV testing.  Fact Sheet for Patients: EntrepreneurPulse.com.au  Fact Sheet for Healthcare Providers: IncredibleEmployment.be  This test is not yet approved or cleared by the [place] FDA and has been authorized for detection and/or diagnosis of SARS-CoV-2 by FDA under an Emergency Use Authorization (EUA). This EUA will remain in effect (meaning this test can be used) for the duration of the COVID-19 declaration under Section 564(b)(1) of the Act, 21 U.S.C. section 360bbb-3(b)(1), unless the authorization is terminated or revoked.     Resp Syncytial Virus by PCR NEGATIVE NEGATIVE Final    Comment: (NOTE) Fact Sheet for Patients: EntrepreneurPulse.com.au  Fact Sheet for Healthcare Providers: IncredibleEmployment.be  This test is not yet approved or cleared by the [place] FDA and has been authorized for detection and/or diagnosis of SARS-CoV-2 by FDA under an Emergency Use Authorization (EUA). This EUA will remain in effect (meaning this test can be used) for the duration of the COVID-19 declaration under Section 564(b)(1) of the Act, 21 U.S.C. section 360bbb-3(b)(1), unless the authorization is terminated or revoked.  Performed at [facility], [address], [place],  [address]          Radiology Studies: ECHOCARDIOGRAM COMPLETE  Result Date: 08/14/2022    ECHOCARDIOGRAM REPORT   Patient Name:   Ronald Thompson Date of Exam: 08/14/2022 Medical Rec #:  [id number]   Height:       70.0 in Accession #:    [id number]  Weight:       161.2 lb Date of Birth:  1955/09/07   BSA:          1.905 m Patient Age:    58 years    BP:           134/74 mmHg  Patient Gender: M           HR:           89 bpm. Exam Location:  Inpatient Procedure: 2D Echo, Cardiac Doppler and Color Doppler Indications:    Pulmonic valve disease I37.9  History:        Patient has prior history of Echocardiogram examinations, most                 recent 12/18/2021. Pulmonary fibrosis, Aortic Valve Disease and                 Mitral Valve Disease, Signs/Symptoms:Murmur; Risk                 Factors:Hypertension and Dyslipidemia.  Cancer.                 Aortic Valve: 26 mm Sapien prosthetic, stented (TAVR) valve is                 present in the aortic position. Procedure Date: 04/16/2016.  Sonographer:    Darlina Sicilian RDCS Referring Phys: [place]  1. Left ventricular ejection fraction, by estimation, is 65 to 70%. The left ventricle has normal function. The left ventricle has no regional wall motion abnormalities. There is moderate concentric left ventricular hypertrophy. Left ventricular diastolic parameters are indeterminate. There is the interventricular septum is flattened in systole, consistent with right ventricular pressure overload.  2. Right ventricular systolic function is mildly reduced. The right ventricular size is severely enlarged. There is severely elevated pulmonary artery systolic pressure. The estimated right ventricular systolic pressure is 89.2 mmHg.  3. Right atrial size was mild to moderately dilated.  4. The mitral valve is degenerative. Mild mitral valve regurgitation. Severe mitral stenosis. The mean mitral valve gradient is 10.0 mmHg with average heart rate of 89 bpm. Severe mitral annular calcification.  5. Tricuspid valve regurgitation is severe.  6. The aortic valve has been repaired/replaced. Aortic valve regurgitation is trivial, valvular and paravalvular. There is Arav Bannister 26 mm Sapien prosthetic (TAVR) valve present in the aortic position. Procedure Date: 04/16/2016. Aortic valve area, by VTI measures 3.10 cm. Aortic valve mean gradient  measures 5.2 mmHg. Aortic valve Vmax measures 1.60 m/s.  7. Pulmonic valve regurgitation is moderate.  8. There is Moderate (Grade III) atheroma plaque involving the ascending aorta.  9. The inferior vena cava is normal in size with <50% respiratory variability, suggesting right atrial pressure of 8 mmHg. FINDINGS  Left Ventricle: Left ventricular ejection fraction, by estimation, is 65 to 70%. The left ventricle has normal function. The left ventricle has no regional wall motion abnormalities. The left ventricular internal cavity size was small. There is moderate  concentric left ventricular hypertrophy. The interventricular septum is flattened in systole, consistent with right ventricular pressure overload. Left ventricular diastolic parameters are indeterminate. Right Ventricle: The right ventricular size is severely enlarged. No increase in right ventricular wall thickness. Right ventricular systolic function is mildly reduced. There is severely elevated pulmonary artery systolic pressure. The tricuspid regurgitant velocity is 4.14 m/s, and with an assumed right atrial pressure of 8 mmHg, the estimated right ventricular systolic pressure is 11.9 mmHg. Left Atrium: Left atrial size was normal in size. Right Atrium: Right atrial size was mild to moderately dilated. Pericardium: There is no evidence of pericardial effusion. Mitral Valve: The mitral valve is degenerative in appearance. There is severe thickening of the mitral valve leaflet(s). There is severe calcification of the mitral valve leaflet(s). Severe mitral annular calcification. Mild mitral valve regurgitation. Severe mitral valve stenosis. MV peak gradient, 19.2 mmHg. The mean mitral valve gradient is 10.0 mmHg with average heart rate of 89 bpm. Tricuspid Valve: The tricuspid valve is grossly normal. Tricuspid valve regurgitation is severe. The flow in the hepatic veins is reversed during ventricular systole. Aortic Valve: The aortic valve has been  repaired/replaced. Aortic valve regurgitation is trivial. Aortic valve mean gradient measures 5.2 mmHg. Aortic valve peak gradient measures 10.2 mmHg. Aortic valve area, by VTI measures 3.10 cm. There is Sofhia Ulibarri 26 mm Sapien prosthetic, stented (TAVR) valve present in the aortic position. Procedure Date: 04/16/2016. Pulmonic Valve: The pulmonic valve was grossly normal. Pulmonic valve regurgitation is moderate. No evidence of  pulmonic stenosis. Aorta: The aortic root and ascending aorta are structurally normal, with no evidence of dilitation. There is moderate (Grade III) atheroma plaque involving the ascending aorta. Venous: The inferior vena cava is normal in size with less than 50% respiratory variability, suggesting right atrial pressure of 8 mmHg. IAS/Shunts: No atrial level shunt detected by color flow Doppler.  LEFT VENTRICLE PLAX 2D LVIDd:         3.30 cm   Diastology LVIDs:         2.30 cm   LV e' medial:    5.33 cm/s LV PW:         1.30 cm   LV E/e' medial:  33.2 LV IVS:        1.50 cm   LV e' lateral:   6.96 cm/s LVOT diam:     2.50 cm   LV E/e' lateral: 25.4 LV SV:         86 LV SV Index:   45 LVOT Area:     4.91 cm  RIGHT VENTRICLE RV Basal diam:  5.10 cm RV Mid diam:    3.90 cm RV S prime:     10.90 cm/s TAPSE (M-mode): 1.8 cm LEFT ATRIUM           Index        RIGHT ATRIUM           Index LA diam:      3.80 cm 2.00 cm/m   RA Area:     20.00 cm LA Vol (A4C): 59.6 ml 31.29 ml/m  RA Volume:   57.40 ml  30.14 ml/m  AORTIC VALVE                     PULMONIC VALVE AV Area (Vmax):    3.11 cm      PR End Diast Vel: 2.16 msec AV Area (Vmean):   3.06 cm AV Area (VTI):     3.10 cm AV Vmax:           159.64 cm/s AV Vmean:          105.119 cm/s AV VTI:            0.278 m AV Peak Grad:      10.2 mmHg AV Mean Grad:      5.2 mmHg LVOT Vmax:         101.00 cm/s LVOT Vmean:        65.600 cm/s LVOT VTI:          0.176 m LVOT/AV VTI ratio: 0.63  AORTA Ao Root diam: 2.80 cm Ao Asc diam:  3.20 cm MITRAL VALVE                 TRICUSPID VALVE MV Area (PHT): 2.91 cm     TR Peak grad:   68.6 mmHg MV Area VTI:   1.57 cm     TR Vmax:        414.00 cm/s MV Peak grad:  19.2 mmHg MV Mean grad:  10.0 mmHg    SHUNTS MV Vmax:       2.19 m/s     Systemic VTI:  0.18 m MV Vmean:      147.0 cm/s   Systemic Diam: 2.50 cm MV Decel Time: 261 msec MV E velocity: 177.00 cm/s MV Elisama Thissen velocity: 209.00 cm/s MV E/Jylian Pappalardo ratio:  0.85 Cherlynn Kaiser MD Electronically signed by Cherlynn Kaiser MD Signature Date/Time: 08/14/2022/11:07:56 AM    Final  CT ABDOMEN PELVIS W CONTRAST  Result Date: 08/13/2022 CLINICAL DATA:  History of liver Mets and cancer EXAM: CT ABDOMEN AND PELVIS WITH CONTRAST TECHNIQUE: Multidetector CT imaging of the abdomen and pelvis was performed using the standard protocol following bolus administration of intravenous contrast. RADIATION DOSE REDUCTION: This exam was performed according to the departmental dose-optimization program which includes automated exposure control, adjustment of the mA and/or kV according to patient size and/or use of iterative reconstruction technique. CONTRAST:  133m OMNIPAQUE IOHEXOL 350 MG/ML SOLN COMPARISON:  PET CT 07/15/2022, 03/28/2022, CT 02/29/2016 FINDINGS: Lower chest: Lung bases demonstrate small bilateral pleural effusions. Partial atelectasis or pneumonia in the right lower lobe clear cardiomegaly with aortic valve prosthesis. Similar elevation left diaphragm. Hepatobiliary: Markedly enlarged liver. Numerous hepatic metastatic lesions. Left hepatic lobe mass measures 10.1 x 10.2 cm, previously 10.1 x 10.2 cm. Dominant right hepatic mass measures 15.1 x 12.3 cm, previous 15.1 cm maximum. Inferior left hepatic mass measuring 11.2 cm compared with 11.2 cm previously. Numerous additional large hepatic masses. Pancreas: Displaced to the left upper quadrant by hepatic masses. No acute inflammatory process. Spleen: Normal in size without focal abnormality. Adrenals/Urinary Tract: Adrenal glands are within  normal limits. The kidneys show no hydronephrosis. The bladder is unremarkable Stomach/Bowel: The stomach is nonenlarged. No dilated small bowel. Bowel is displaced to the left upper quadrant by enlarged liver. No acute bowel wall thickening. Vascular/Lymphatic: Moderate aortic atherosclerosis. No aneurysm. No suspicious lymph nodes. Mass effect on hepatic vessels by multiple large liver masses. Reproductive: Prostate is slightly enlarged Other: No free air.  No significant pelvic effusion. Musculoskeletal: No acute osseous abnormality. Small foci of sclerosis within the left iliac bone without significant change IMPRESSION: 1. Markedly enlarged liver with numerous large hepatic metastatic lesions without gross interval change in size as compared with PET CT from December. 2. Small bilateral pleural effusions with partial atelectasis or pneumonia in the right lower lobe. Cardiomegaly. 3. Aortic atherosclerosis. Aortic Atherosclerosis (ICD10-I70.0). Electronically Signed   By: KDonavan FoilM.D.   On: 08/13/2022 17:52   CT Angio Chest PE W and/or Wo Contrast  Result Date: 08/13/2022 CLINICAL DATA:  Shortness of breath for several days. Hypoxic. No neoplasm with liver metastases. Neuroendocrine tumor. On chemotherapy. EXAM: CT ANGIOGRAPHY CHEST WITH CONTRAST TECHNIQUE: Multidetector CT imaging of the chest was performed using the standard protocol during bolus administration of intravenous contrast. Multiplanar CT image reconstructions and MIPs were obtained to evaluate the vascular anatomy. RADIATION DOSE REDUCTION: This exam was performed according to the departmental dose-optimization program which includes automated exposure control, adjustment of the mA and/or kV according to patient size and/or use of iterative reconstruction technique. CONTRAST:  1083mOMNIPAQUE IOHEXOL 350 MG/ML SOLN COMPARISON:  Chest x-ray earlier 08/13/2022. CT angiogram 01/17/22 older exams well. FINDINGS: Cardiovascular: The heart is  enlarged. Particularly involving the right atrium with some reflux of contrast into the hepatic veins. Status post TAVR. Normal caliber thoracic aorta otherwise with some scattered mild plaque. Small pericardial effusion. Right IJ chest port. Some enlargement of the pulmonary artery. Please correlate for any evidence of pulmonary arterial hypertension. There is Stephfon Bovey focal stricture along the proximal left main pulmonary artery. There is differences in opacification from the contrast injection proximal and distal to the stricture. Appearance suggests Cyndi Montejano high-grade stenosis. Please correlate for any known history. There are areas of peripheral arterial branches in the left lung which have suboptimal enhancement. Calcifications are seen along the main pulmonary artery origin. With the motion and the  contrast enhancement no large, central pulmonary embolism identified. Mediastinum/Nodes: No specific abnormal lymph node enlargement present in the axillary region, hilum. Shaneisha Burkel few small mediastinal nodes are seen, not pathologic by size criteria. Lungs/Pleura: Tiny bilateral pleural effusions are seen, right greater than left. Adjacent parenchymal opacity is identified. There are scattered ground-glass. Areas of interstitial septal thickening. More bandlike areas in the lingula likely again atelectatic or scar. The right effusion is new from previous. The adjacent opacity is also new. Diffuse breathing motion. Upper Abdomen: Please see separate dictation of the abdomen and pelvis CT from the same day. Musculoskeletal: Anasarca. Scattered degenerative changes along the spine. Review of the MIP images confirms the above findings. IMPRESSION: Status post TAVR.  Enlarged heart. There is calcifications along the main pulmonary artery with dilated pulmonary arterial vessels diffusely. There is also what appears to be Aymen Widrig high-grade stricture or stenosis involving the origin of the left main pulmonary artery with variant enhancement of  the pulmonary artery distal to the stenosis. Please correlate with any known history or further workup is recommended. Development of Brinson Tozzi small right pleural effusion with adjacent parenchymal opacity. Atelectasis versus infiltrate. Stable tiny left effusion. Areas of interstitial septal thickening. Chest port. Please see separate dictation of abdomen and pelvis CT. Aortic Atherosclerosis (ICD10-I70.0). Electronically Signed   By: Jill Side M.D.   On: 08/13/2022 17:39   DG Chest Port 1 View  Result Date: 08/13/2022 CLINICAL DATA:  Shortness of breath. EXAM: PORTABLE CHEST 1 VIEW COMPARISON:  Chest radiograph November 19, 2020 FINDINGS: Port-Belina Mandile-Cath tip projects over the superior vena cava. Monitoring leads overlie the patient. Stable cardiac and mediastinal contours. Elevation left hemidiaphragm. Scattered predominately lower lung heterogeneous opacities. No pleural effusion or pneumothorax. Thoracic spine degenerative changes. IMPRESSION: Scattered lower lung heterogeneous opacities may represent atelectasis or infection. Electronically Signed   By: Lovey Newcomer M.D.   On: 08/13/2022 16:19        Scheduled Meds:  Chlorhexidine Gluconate Cloth  6 each Topical Q0600   feeding supplement  237 mL Oral BID BM   furosemide  40 mg Intravenous BID   [START ON 08/15/2022] multivitamin with minerals  1 tablet Oral Daily   oxymetazoline  1 spray Each Nare BID   simvastatin  10 mg Oral q1800   sodium chloride flush  3 mL Intravenous Q12H   tamsulosin  0.4 mg Oral Daily   Continuous Infusions:  sodium chloride       LOS: 0 days    Time spent: over 30 min    Fayrene Helper, MD [facility]   To contact the attending provider between 7A-7P or the covering provider during after hours 7P-7A, please log into the web site [email or web address] and access using universal [place] password for that web site. If you do not have the password, please call the hospital operator.  08/14/2022, 6:16 PM

## 2022-08-14 NOTE — Consult Note (Signed)
Cardiology Consultation   Patient ID: Ronald Thompson MRN: [id number]; DOB: May 25, 1956  Admit date: 08/13/2022 Date of Consult: 08/14/2022  PCP:  [name], MD   [facility] Cardiologist:  Mertie Moores, MD     Patient Profile:   Ronald Thompson is a 67 y.o. male with a hx of aortic stenosis s/p TAVR,  neuroendocrine tumor/pancreatic cancer with mets to liver, HTN, HLD, BPH, asthma, chest seminoma who is being seen 08/14/2022 for the evaluation of CHF at the request of Dr. Florene Glen.  History of Present Illness:   Ronald Thompson is a 67 year old male with above medical history who is followed by Dr. Acie Fredrickson. Per chart review, patient established care with Dr. Acie Fredrickson in 2017 for evaluation of aortic stenosis. Echocardiogram on 12/2015 showed EF 60-65%, mild LVH, severe aortic stenosis with mean gradient 39 mmHg. Patient was referred for evaluation of TAVR and seen by Dr. Burt Knack in 01/2016. CT coronary on 01/17/2016 showed that the aortic valve was trileaflet with extensive calcifications of all three leaflets, mild non-obstructive CAD in the LAD. R/L heart catheterization on 01/24/16 showed severe aortic stenosis, moderate aortic regurgitation (3+), widely patent coronary arteries. Underwent TAVR on 04/16/2016. Most recent echocardiogram from 12/18/21 showed EF 65-70%, no regional wall motion abnormalities, grade 1 diastolic dysfunction, normal RV systolic function, mild-moderate mitral valve stenosis, mild-moderate tricuspid valve regurgitation.  TAVR valve in place, findings consistent with normal structure and function of the aortic valve prosthesis.  Patient was last seen by cardiology on 06/18/2022.  At that time, patient admitted that the cancer was taking its toll.  Liver lesions were growing.  He was unable to exercise due to weakness.  Had some chronic dyspnea due to pulmonary fibrosis from chemotherapy.  Patient was brought into the ED at [facility] on 1/9 from independent living at  [facility] complaining of SOB for several days. Also complained of swelling to the abdomen and ankles. Labs in the ED showed Na 140, K 4.3, creatinine 0.85, WBC 5.5, hemoglobin 9.8, platelets 120. BNP elevated to 1164.3. hsTn 33>30>30. Lactic acid 1.4. Procalcitonin 0.22.   CXR showed scattered lower lung heterogeneous opacities that may represent atelectasis or infection. CTA chest showed s/p TAVR, enlarged heart, a high-grade stricture or stenosis involving the origin of the left main pulmonary artery with variant enhancement of the pulmonary artery distal to the stenosis. CT abdomen pelvis showed markedly enlarged liver with numerous large hepatic metastatic lesions, small bilateral pleural effusions with partial atelectasis or pneumonia of the right lower lobe.   Echocardiogram showed EF 60-70%, no regional wall motion abnormalities, moderate LVH, mildly reduced RV systolic function.  The interventricular septum flattened in systole consistent with right ventricular pressure overload.  Severe mitral stenosis, severe tricuspid valve regurgitation.  TAVR valve present.   On interview, patient reports that on Friday 1/5, he began to develop SOB. SOB continued to worsen until he came into the ED yesterday. SOB was most noticeable on exertion and when laying flat on his back. Denies cough, nasal congestion, fever, chills, body aches. He also had noticed progressive ankle edema for the past week, and has had progressive abdominal distention for the past month. Since coming to the hospital, his breathing has improved and orthopnea is less noticeable. Continues to have abdominal distention, ankle edema. 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 25 yrs ago-during cancer tx.    Past Surgical History:  Procedure Laterality Date   BACK SURGERY     '14- rupt. disc    CARDIAC CATHETERIZATION N/A 01/24/2016   Procedure: Right/Left Heart Cath and Coronary Angiography;  Surgeon: [name], MD;  Location: [facility];  Service: Cardiovascular;  Laterality: N/A;   COLONOSCOPY     EUS N/A 02/10/2014   Procedure: UPPER ENDOSCOPIC ULTRASOUND (EUS) LINEAR;  Surgeon: [name], MD;  Location: [facility];  Service: Endoscopy;  Laterality: N/A;   IR IMAGING GUIDED PORT INSERTION  08/02/2022   IR RADIOLOGIST EVAL & MGMT  02/11/2017   KNEE SURGERY Right    age 70 for Baker's cyst   NASAL SEPTUM SURGERY     Deviated septrum   TEE WITHOUT CARDIOVERSION N/A 04/16/2016   Procedure: TRANSESOPHAGEAL ECHOCARDIOGRAM (TEE);  Surgeon: [name], MD;  Location: [facility];  Service: Open Heart Surgery;  Laterality: N/A;   THORACOTOMY  1990   wedge biopsy of mediastinal mass   TRANSCATHETER AORTIC VALVE REPLACEMENT, TRANSFEMORAL N/A 04/16/2016   Procedure: TRANSCATHETER AORTIC VALVE REPLACEMENT, TRANSFEMORAL;  Surgeon: [name], MD;  Location: [facility];  Service: Open Heart Surgery;  Laterality: N/A;     Home Medications:  Prior to Admission medications   Medication Sig Start Date End  Date Taking? Authorizing Provider  Aromatic Inhalants (VICKS VAPOR INHALER IN) Inhale 1 spray into the lungs daily as needed (congestion).   Yes [provider]  Cholecalciferol (VITAMIN D-3) 125 MCG (5000 UT) TABS Take 1 tablet by mouth daily in the afternoon.   Yes [provider]  clindamycin (CLEOCIN) 300 MG capsule Take 2 capsules by mouth one hour prior to dental appointment 05/13/16  Yes [name], MD  diphenhydrAMINE (BENADRYL) 25 MG tablet Take 25-50 mg by mouth at bedtime.   Yes [provider]  lidocaine-prilocaine (EMLA) cream Apply to portacath site 1 hour prior to use 08/06/22  Yes Owens Shark, NP  loperamide (IMODIUM) 2 MG capsule Take 2 mg by mouth as needed for diarrhea or loose stools.   Yes [provider]  losartan (COZAAR) 100 MG tablet Take 1 tablet (100 mg total) by mouth daily. 11/06/21  Yes [name], MD  prochlorperazine (COMPAZINE) 10 MG tablet Take 1 tablet (10 mg total) by mouth every 6 (six) hours as needed for nausea or vomiting. 08/06/22  Yes Owens Shark, NP  simvastatin (ZOCOR) 20 MG tablet TAKE ONE TABLET BY MOUTH DAILY AT 8PM Patient taking differently: Take 20 mg by mouth daily at  6 PM. TAKE ONE TABLET BY MOUTH DAILY AT 8PM 11/06/21  Yes [name], MD  acetaminophen (TYLENOL) 500 MG tablet Take 1,000 mg by mouth every 6 (six) hours as needed for headache. Patient not taking: Reported on 08/14/2022    [provider]  albuterol (VENTOLIN HFA) 108 (90 Base) MCG/ACT inhaler Inhale 1-2 puffs into the lungs every 6 (six) hours as needed for wheezing or shortness of breath. Patient not taking: Reported on 08/14/2022 12/26/20   [name], MD  Lancet Device MISC Use as instructed to test blood sugar daily E11.65 10/13/19   Shamleffer, Melanie Crazier, MD  Lancets [facility] DELICA PLUS [id number]) [place] TEST ONCE DAILY 08/20/21   Shamleffer, Melanie Crazier, MD  octreotide (SANDOSTATIN LAR) 30 MG injection Inject 30 mg  into the muscle every 28 (twenty-eight) days. Patient not taking: Reported on 08/14/2022    [provider]  oxymetazoline (AFRIN) 0.05 % nasal spray Place 2 sprays into both nostrils at bedtime as needed for congestion. Patient not taking: Reported on 08/14/2022    [provider]  tamsulosin (FLOMAX) 0.4 MG CAPS capsule Take 1 capsule (0.4 mg total) by mouth daily. Patient not taking: Reported on 08/14/2022 05/09/22   [name], MD    Inpatient Medications: Scheduled Meds:  Chlorhexidine Gluconate Cloth  6 each Topical Q0600   simvastatin  10 mg Oral q1800   sodium chloride flush  3 mL Intravenous Q12H   tamsulosin  0.4 mg Oral Daily   Continuous Infusions:  sodium chloride     PRN Meds: sodium chloride, acetaminophen **OR** acetaminophen, albuterol, HYDROcodone-acetaminophen, sodium chloride flush  Allergies:    Allergies  Allergen Reactions   Penicillins Hives    ENTIRE BODY Has patient had a PCN reaction causing immediate rash, facial/tongue/throat swelling, SOB or lightheadedness with hypotension: No Has patient had a PCN reaction causing severe rash involving mucus membranes or skin necrosis: No Has patient had a PCN reaction that required hospitalization * *  YES  * * Has patient had a PCN reaction occurring within the last 10 years: No If all of the above answers are "NO", then may proceed with Cephalosporin use. *reaction occurred when he was 17    Social History:   Social History   Socioeconomic History   Marital status: Married    Spouse name: Not on file   Number of children: 3   Years of education: 16   Highest education level: Not on file  Occupational History   Occupation: Administrator, sports: [employer]  Tobacco Use   Smoking status: Never   Smokeless tobacco: Never  Vaping Use   Vaping Use: Never used  Substance and Sexual Activity   Alcohol use: Yes    Comment: rare- social   Drug use: No   Sexual activity: Yes     Partners: Female  Other Topics Concern   Not on file  Social History Narrative   [name]. 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 2 sons: 1979, 1985- married with 2 daughters 1 in route: Oldest in [place], younger [place], [place]. 1 daughter: 10 [place], married-1 granddaughter.Customer service manager- Nurse, mental health. SO- good health; marriage in good health   Enjoys watching movies, works out at home gym   Social Determinants of Health   Financial Resource Strain: Low Risk  (07/23/2022)   Overall Financial Resource Strain (CARDIA)    Difficulty of Paying Living Expenses: Not hard at all  Food Insecurity: No Food Insecurity (07/23/2022)   Hunger  Vital Sign    Worried About Charity fundraiser in the Last Year: Never true    Ran Out of Food in the Last Year: Never true  Transportation Needs: No Transportation Needs (07/23/2022)   PRAPARE - Hydrologist (Medical): No    Lack of Transportation (Non-Medical): No  Physical Activity: Inactive (07/23/2022)   Exercise Vital Sign    Days of Exercise per Week: 0 days    Minutes of Exercise per Session: 0 min  Stress: No Stress Concern Present (07/23/2022)   [place]    Feeling of Stress : Not at all  Social Connections: [place] (07/23/2022)   Social Connection and Isolation Panel [NHANES]    Frequency of Communication with Friends and Family: More than three times a week    Frequency of Social Gatherings with Friends and Family: More than three times a week    Attends Religious Services: 1 to 4 times per year    Active Member of [employer] or Organizations: Yes    Attends Archivist Meetings: 1 to 4 times per year    Marital Status: Married  Human resources officer Violence: Not At Risk (07/23/2022)   Humiliation, Afraid, Rape, and Kick questionnaire    Fear of Current or Ex-Partner: No    Emotionally Abused: No    Physically Abused: No    Sexually Abused: No     Family History:    Family History  Problem Relation Age of Onset   Dementia Mother    Cancer Paternal Grandfather        esophagus   Cancer Maternal Aunt        colon   Emphysema Maternal Aunt      ROS:  Please see the history of present illness.   All other ROS reviewed and negative.     Physical Exam/Data:   Vitals:   08/13/22 2214 08/14/22 0232 08/14/22 0432 08/14/22 0816  BP: 121/81 117/75 122/89 134/74  Pulse: 94 93 95 91  Resp: '18 18 18 17  '$ Temp: 97.6 F (36.4 C) 98.5 F (36.9 C) 98.5 F (36.9 C) 97.8 F (36.6 C)  TempSrc: Oral Oral Oral Oral  SpO2: 95% 98% 97% 92%  Weight:      Height:        Intake/Output Summary (Last 24 hours) at 08/14/2022 1058 Last data filed at 08/14/2022 0713 Gross per 24 hour  Intake 150 ml  Output 1500 ml  Net -1350 ml      08/13/2022   10:04 PM 08/13/2022    3:23 PM 08/06/2022    8:50 AM  Last 3 Weights  Weight (lbs) 161 lb 3.2 oz 156 lb 158 lb  Weight (kg) 73.12 kg 70.761 kg 71.668 kg     Body mass index is 23.13 kg/m.  General:  Chronically ill male, laying comfortably in the bed with head elevated. No acute distress  HEENT: normal Neck: + JVD Vascular: Radial pulses 2+ bilaterally Cardiac:  normal S1, S2; RRR. Faint systolic murmur at sternal boarder. Mechanical S2 click present  Lungs:  diminished breath sounds in bilateral lung bases  Abd: distended Ext: 2+ pitting edema in BLE  Musculoskeletal:  No deformities, BUE and BLE strength normal and equal Skin: warm and dry  Neuro:  CNs 2-12 intact, no focal abnormalities noted Psych:  Normal affect   EKG:  The EKG was personally reviewed and demonstrates:  sinus rhythm, possible RAE  Telemetry:  Telemetry was personally reviewed and demonstrates:  Normal Sinus rhythm   Relevant CV Studies:  Echocardiogram 08/14/22 1. Left ventricular ejection fraction, by estimation, is 65 to 70%. The  left ventricle has normal function. The left ventricle has no regional  wall motion  abnormalities. There is moderate concentric left ventricular  hypertrophy. Left ventricular  diastolic parameters are indeterminate. There is the interventricular  septum is flattened in systole, consistent with right ventricular pressure  overload.   2. Right ventricular systolic function is mildly reduced. The right  ventricular size is severely enlarged. There is severely elevated  pulmonary artery systolic pressure. The estimated right ventricular  systolic pressure is 24.5 mmHg.   3. Right atrial size was mild to moderately dilated.   4. The mitral valve is degenerative. Mild mitral valve regurgitation.  Severe mitral stenosis. The mean mitral valve gradient is 10.0 mmHg with  average heart rate of 89 bpm. Severe mitral annular calcification.   5. Tricuspid valve regurgitation is severe.   6. The aortic valve has been repaired/replaced. Aortic valve  regurgitation is trivial, valvular and paravalvular. There is a 26 mm  Sapien prosthetic (TAVR) valve present in the aortic position. Procedure  Date: 04/16/2016. Aortic valve area, by VTI  measures 3.10 cm. Aortic valve mean gradient measures 5.2 mmHg. Aortic  valve Vmax measures 1.60 m/s.   7. Pulmonic valve regurgitation is moderate.   8. There is Moderate (Grade III) atheroma plaque involving the ascending  aorta.   9. The inferior vena cava is normal in size with <50% respiratory  variability, suggesting right atrial pressure of 8 mmHg.   Laboratory Data:  High Sensitivity Troponin:   Recent Labs  Lab 08/13/22 2315 08/14/22 0344 08/14/22 0612  TROPONINIHS 33* 30* 30*     Chemistry Recent Labs  Lab 08/13/22 1546 08/13/22 2315  NA 140 138  K 4.3 4.1  CL 101 96*  CO2 31 32  GLUCOSE 131* 110*  BUN 21 17  CREATININE 0.85 1.09  CALCIUM 9.0 9.1  MG  --  1.8  GFRNONAA >60 >60  ANIONGAP 8 10    Recent Labs  Lab 08/13/22 1546 08/13/22 2315  PROT 6.2* 6.3*  ALBUMIN 3.6 3.3*  AST 29 37  ALT 20 24  ALKPHOS 128*  145*  BILITOT 1.5* 1.8*   Lipids No results for input(s): "CHOL", "TRIG", "HDL", "LABVLDL", "LDLCALC", "CHOLHDL" in the last 168 hours.  Hematology Recent Labs  Lab 08/13/22 1546 08/13/22 2315  WBC 5.5 5.2  RBC 2.89* 2.94*  HGB 9.8* 10.2*  HCT 30.1* 30.0*  MCV 104.2* 102.0*  MCH 33.9 34.7*  MCHC 32.6 34.0  RDW 14.2 13.9  PLT 120* 121*   Thyroid  Recent Labs  Lab 08/13/22 2315  TSH 1.980    BNP Recent Labs  Lab 08/13/22 1546  BNP 1,164.3*    DDimer No results for input(s): "DDIMER" in the last 168 hours.   Radiology/Studies:  CT ABDOMEN PELVIS W CONTRAST  Result Date: 08/13/2022 CLINICAL DATA:  History of liver Mets and cancer EXAM: CT ABDOMEN AND PELVIS WITH CONTRAST TECHNIQUE: Multidetector CT imaging of the abdomen and pelvis was performed using the standard protocol following bolus administration of intravenous contrast. RADIATION DOSE REDUCTION: This exam was performed according to the departmental dose-optimization program which includes automated exposure control, adjustment of the mA and/or kV according to patient size and/or use of iterative reconstruction technique. CONTRAST:  159m OMNIPAQUE IOHEXOL 350 MG/ML SOLN COMPARISON:  PET CT 07/15/2022,  03/28/2022, CT 02/29/2016 FINDINGS: Lower chest: Lung bases demonstrate small bilateral pleural effusions. Partial atelectasis or pneumonia in the right lower lobe clear cardiomegaly with aortic valve prosthesis. Similar elevation left diaphragm. Hepatobiliary: Markedly enlarged liver. Numerous hepatic metastatic lesions. Left hepatic lobe mass measures 10.1 x 10.2 cm, previously 10.1 x 10.2 cm. Dominant right hepatic mass measures 15.1 x 12.3 cm, previous 15.1 cm maximum. Inferior left hepatic mass measuring 11.2 cm compared with 11.2 cm previously. Numerous additional large hepatic masses. Pancreas: Displaced to the left upper quadrant by hepatic masses. No acute inflammatory process. Spleen: Normal in size without focal  abnormality. Adrenals/Urinary Tract: Adrenal glands are within normal limits. The kidneys show no hydronephrosis. The bladder is unremarkable Stomach/Bowel: The stomach is nonenlarged. No dilated small bowel. Bowel is displaced to the left upper quadrant by enlarged liver. No acute bowel wall thickening. Vascular/Lymphatic: Moderate aortic atherosclerosis. No aneurysm. No suspicious lymph nodes. Mass effect on hepatic vessels by multiple large liver masses. Reproductive: Prostate is slightly enlarged Other: No free air.  No significant pelvic effusion. Musculoskeletal: No acute osseous abnormality. Small foci of sclerosis within the left iliac bone without significant change IMPRESSION: 1. Markedly enlarged liver with numerous large hepatic metastatic lesions without gross interval change in size as compared with PET CT from December. 2. Small bilateral pleural effusions with partial atelectasis or pneumonia in the right lower lobe. Cardiomegaly. 3. Aortic atherosclerosis. Aortic Atherosclerosis (ICD10-I70.0). Electronically Signed   By: Donavan Foil M.D.   On: 08/13/2022 17:52   CT Angio Chest PE W and/or Wo Contrast  Result Date: 08/13/2022 CLINICAL DATA:  Shortness of breath for several days. Hypoxic. No neoplasm with liver metastases. Neuroendocrine tumor. On chemotherapy. EXAM: CT ANGIOGRAPHY CHEST WITH CONTRAST TECHNIQUE: Multidetector CT imaging of the chest was performed using the standard protocol during bolus administration of intravenous contrast. Multiplanar CT image reconstructions and MIPs were obtained to evaluate the vascular anatomy. RADIATION DOSE REDUCTION: This exam was performed according to the departmental dose-optimization program which includes automated exposure control, adjustment of the mA and/or kV according to patient size and/or use of iterative reconstruction technique. CONTRAST:  126m OMNIPAQUE IOHEXOL 350 MG/ML SOLN COMPARISON:  Chest x-ray earlier 08/13/2022. CT angiogram  01/17/22 older exams well. FINDINGS: Cardiovascular: The heart is enlarged. Particularly involving the right atrium with some reflux of contrast into the hepatic veins. Status post TAVR. Normal caliber thoracic aorta otherwise with some scattered mild plaque. Small pericardial effusion. Right IJ chest port. Some enlargement of the pulmonary artery. Please correlate for any evidence of pulmonary arterial hypertension. There is a focal stricture along the proximal left main pulmonary artery. There is differences in opacification from the contrast injection proximal and distal to the stricture. Appearance suggests a high-grade stenosis. Please correlate for any known history. There are areas of peripheral arterial branches in the left lung which have suboptimal enhancement. Calcifications are seen along the main pulmonary artery origin. With the motion and the contrast enhancement no large, central pulmonary embolism identified. Mediastinum/Nodes: No specific abnormal lymph node enlargement present in the axillary region, hilum. A few small mediastinal nodes are seen, not pathologic by size criteria. Lungs/Pleura: Tiny bilateral pleural effusions are seen, right greater than left. Adjacent parenchymal opacity is identified. There are scattered ground-glass. Areas of interstitial septal thickening. More bandlike areas in the lingula likely again atelectatic or scar. The right effusion is new from previous. The adjacent opacity is also new. Diffuse breathing motion. Upper Abdomen: Please see separate dictation of the  abdomen and pelvis CT from the same day. Musculoskeletal: Anasarca. Scattered degenerative changes along the spine. Review of the MIP images confirms the above findings. IMPRESSION: Status post TAVR.  Enlarged heart. There is calcifications along the main pulmonary artery with dilated pulmonary arterial vessels diffusely. There is also what appears to be a high-grade stricture or stenosis involving the origin  of the left main pulmonary artery with variant enhancement of the pulmonary artery distal to the stenosis. Please correlate with any known history or further workup is recommended. Development of a small right pleural effusion with adjacent parenchymal opacity. Atelectasis versus infiltrate. Stable tiny left effusion. Areas of interstitial septal thickening. Chest port. Please see separate dictation of abdomen and pelvis CT. Aortic Atherosclerosis (ICD10-I70.0). Electronically Signed   By: Jill Side M.D.   On: 08/13/2022 17:39   DG Chest Port 1 View  Result Date: 08/13/2022 CLINICAL DATA:  Shortness of breath. EXAM: PORTABLE CHEST 1 VIEW COMPARISON:  Chest radiograph November 19, 2020 FINDINGS: Port-A-Cath tip projects over the superior vena cava. Monitoring leads overlie the patient. Stable cardiac and mediastinal contours. Elevation left hemidiaphragm. Scattered predominately lower lung heterogeneous opacities. No pleural effusion or pneumothorax. Thoracic spine degenerative changes. IMPRESSION: Scattered lower lung heterogeneous opacities may represent atelectasis or infection. Electronically Signed   By: Lovey Newcomer M.D.   On: 08/13/2022 16:19     Assessment and Plan:   Acute on Chronic Diastolic Heart Failure  Pulmonary HTN  - Echocardiogram this admission showed EF 65-70%, no regional wall motion abnormalities, moderate LVH, mildly reduced RV systolic function. Interventricular septum is flattened in systole, consistent with RV pressure overload. Severe mitral valve stenosis and severe tricuspid valve regurgitation, severely elevated pulmonary artery systolic pressure  - Patient presented complaining of SOB, ankle edema, abdominal distention. BNP elevated to 1164 - So far, patient has received 1 dose of IV lasix 40 mg. Output 1.5 L urine so far  - Patient continues to have JVD, ankle edema, abdominal distention. Increase lasix to 40 mg IV BID  - Given patient's valvular disease, likely will need  PO lasix at discharge for maintenance  - Suspect pulmonary HTN is due to pulmonary fibrosis. Recommend pulmonology evaluation   Severe mitral stenosis Severe tricuspid valve regurgitation - Noted on echocardiogram this admission.  Echocardiogram from 12/2021 noted mild-moderate mitral stenosis, mild-moderate tricuspid valve regurgitation - Unfortunately, given patient's advanced neuroendocrine tumor of pancreas with mets to the liver, he is a poor candidate for valve repair/replacement.  Will have to manage symptoms medically with diuretics   AS s/p TAVR - Echo this admission with TAVR valve present, mean gradient 5.2 mmHg, trivial aortic valve regurgitation (valvular and paravalvular)   Pulmonary Artery Stenosis -Patient seen and evaluated by IR-Per their note, studies dating back to 2015 demonstrated the presence of pulmonary artery stenosis.  As stenosis is chronic and symptoms were acutely onset, they believe it is unlikely that symptoms are directly related to stenosis -Additionally, patient has a history of radiation to the chest.  Any attempt at balloon angioplasty would be high risk. 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

## 2022-08-14 NOTE — Progress Notes (Signed)
Heart Failure Navigator Progress Note  Assessed for Heart & Vascular TOC clinic readiness.  Unfortunately, patient does not meet criteria due to metastatic cancer, currently under chemotherapy treatment. Hospitalization related more to oncological issues.   Pending updated ECHO.   Navigation team available for reassessment as needed.   Pricilla Holm, MSN, RN Heart Failure Nurse Navigator

## 2022-08-14 NOTE — Progress Notes (Signed)
Initial Nutrition Assessment  DOCUMENTATION CODES:   Not applicable  INTERVENTION:   Ensure Enlive po BID, each supplement provides 350 kcal and 20 grams of protein.  MVI po daily   Liberalize diet   Daily weights   Pt at moderate refeed risk; recommend monitor potassium, magnesium and phosphorus labs daily until stable  NUTRITION DIAGNOSIS:   Inadequate oral intake related to acute illness as evidenced by per patient/family report.  GOAL:   Patient will meet greater than or equal to 90% of their needs  MONITOR:   PO intake, Supplement acceptance, Labs, Weight trends, I & O's, Skin  REASON FOR ASSESSMENT:   Consult Assessment of nutrition requirement/status  ASSESSMENT:   67 y.o. male with medical history significant of neuroendocrine tumor with metastases to the liver on chemotherapy, asthma, pulmonary fibrosis, hypertension, hyperlipidemia, BPH, seminoma (1990), DM, CKD III, aortic stenosis, severe mitral stenosis, severe tricuspid valve regurgitation, s/p TAVR and pulmonary hypertension who is admitted with CHF.  RD working remotely.  Spoke with pt via phone. Pt reports decreased appetite and oral intake for ~3 days pta. Pt reports that today, his appetite and oral intake has started to improve. Pt reports that his weight is stable at baseline but reports that he went from 156 to 161lbs pta r/t CHF. Per chart, pt does appear weight stable at baseline. RD discussed with pt the importance of adequate nutrition needed to preserve lean muscle. Pt is willing to drink chocolate Ensure in hospital. RD will add supplements and MVI to help pt meet his estimated needs. RD will also liberalize pt's diet. Pt is at refeed risk. RD will obtain nutrition related exam at follow-up.   Medications reviewed and include: lasix, MVI  Labs reviewed: K 4.1 wnl, P 3.5 wnl, Mg 1.8 wnl BNP- 1164(H)- 1/9 Hgb 10.2(L), Hct 30.0(L), MCV 102.0(H), MCH 34.7(H) Cbgs- 88, 99, 110 x 24 hrs AIC 6.2-  10/5  NUTRITION - FOCUSED PHYSICAL EXAM: Unable to perform at this time   Diet Order:   Diet Order             Diet 2 gram sodium Room service appropriate? Yes; Fluid consistency: Thin; Fluid restriction: 1800 mL Fluid  Diet effective now                  EDUCATION NEEDS:   Education needs have been addressed  Skin:  Skin Assessment: Reviewed RN Assessment  Last BM:  1/9  Height:   Ht Readings from Last 1 Encounters:  08/13/22 '5\' 10"'$  (1.778 m)    Weight:   Wt Readings from Last 1 Encounters:  08/13/22 73.1 kg    Ideal Body Weight:  75.45 kg  BMI:  Body mass index is 23.13 kg/m.  Estimated Nutritional Needs:   Kcal:  2000-2300kcal/day  Protein:  100-115g/day  Fluid:  1.5-1.8L/day  Koleen Distance MS, RD, LDN Please refer to [facility] for RD and/or RD on-call/weekend/after hours pager

## 2022-08-14 NOTE — Evaluation (Signed)
Occupational Therapy Evaluation Patient Details Name: Ronald Thompson MRN: [id number] DOB: [birth year] Today's Date: 08/14/2022   History of Present Illness Pt is a 67 y/o M presenting to ED from [place] on 1/9 with SOB x several days, admitted for acute on chronic CHF. PMH includes neuroendocrine tomur with mets to liver, asthma, HLD, HTN, and BPH.   Clinical Impression   Pt reports independence at baseline with ADLs and functional mobility, lives in [place] with spouse (who has Alzheimer's) and assists her as needed. Pt currently supervision-min A for ADLs, supervision for bed mobility, and supervision for transfers without AD. VSS on RA throughout session. Pt presenting with impairments listed below, will follow acutely. Recommend HHOT at d/c pending progression.      Recommendations for follow up therapy are one component of a multi-disciplinary discharge planning process, led by the attending physician.  Recommendations may be updated based on patient status, additional functional criteria and insurance authorization.   Follow Up Recommendations  Home health OT (may progress to no OT needs)     Assistance Recommended at Discharge Set up Supervision/Assistance  Patient can return home with the following A little help with bathing/dressing/bathroom;Assistance with cooking/housework;Assist for transportation;Help with stairs or ramp for entrance    Functional Status Assessment  Patient has had a recent decline in their functional status and demonstrates the ability to make significant improvements in function in a reasonable and predictable amount of time.  Equipment Recommendations  Tub/shower seat    Recommendations for Other Services PT consult     Precautions / Restrictions Precautions Precautions: Fall Restrictions Weight Bearing Restrictions: No      Mobility Bed Mobility Overal bed mobility: Needs Assistance Bed Mobility: Supine to Sit, Sit to Supine     Supine  to sit: Supervision Sit to supine: Supervision        Transfers Overall transfer level: Needs assistance Equipment used: None Transfers: Sit to/from Stand Sit to Stand: Supervision                  Balance Overall balance assessment: Mild deficits observed, not formally tested                                         ADL either performed or assessed with clinical judgement   ADL Overall ADL's : Needs assistance/impaired Eating/Feeding: NPO   Grooming: Supervision/safety   Upper Body Bathing: Supervision/ safety   Lower Body Bathing: Minimal assistance   Upper Body Dressing : Supervision/safety   Lower Body Dressing: Minimal assistance   Toilet Transfer: Supervision/safety;Ambulation;Regular Museum/gallery exhibitions officer and Hygiene: Min guard       Functional mobility during ADLs: Supervision/safety       Vision   Vision Assessment?: No apparent visual deficits     Perception Perception Perception Tested?: No   Praxis Praxis Praxis tested?: Not tested    Pertinent Vitals/Pain Pain Assessment Pain Assessment: No/denies pain     Hand Dominance Right   Extremity/Trunk Assessment Upper Extremity Assessment Upper Extremity Assessment: Generalized weakness   Lower Extremity Assessment Lower Extremity Assessment: Defer to PT evaluation   Cervical / Trunk Assessment Cervical / Trunk Assessment: Normal   Communication Communication Communication: No difficulties   Cognition Arousal/Alertness: Awake/alert Behavior During Therapy: WFL for tasks assessed/performed Overall Cognitive Status: Within Functional Limits for tasks assessed  General Comments  VSS on RA    Exercises     Shoulder Instructions      Home Living Family/patient expects to be discharged to:: Private residence Living Arrangements: Spouse/significant other Available Help at Discharge:  Family;Available 24 hours/day Type of Home: House Home Access: Level entry;Elevator     Home Layout: One level     Bathroom Shower/Tub: Chief Strategy Officer: None          Prior Functioning/Environment Prior Level of Function : Independent/Modified Independent;Driving             Mobility Comments: no AD ADLs Comments: ind, assists wife who has alzeheimer's        OT Problem List: Decreased strength;Decreased range of motion;Decreased activity tolerance      OT Treatment/Interventions: Self-care/ADL training;Therapeutic exercise;Energy conservation;DME and/or AE instruction;Therapeutic activities;Patient/family education;Balance training    OT Goals(Current goals can be found in the care plan section) Acute Rehab OT Goals Patient Stated Goal: to rest and eat OT Goal Formulation: With patient Time For Goal Achievement: 08/28/22 Potential to Achieve Goals: Good ADL Goals Pt Will Perform Upper Body Dressing: Independently;sitting Pt Will Perform Lower Body Dressing: Independently;sitting/lateral leans;sit to/from stand Pt Will Perform Toileting - Clothing Manipulation and hygiene: Independently;sit to/from stand;sitting/lateral leans Pt Will Perform Tub/Shower Transfer: Shower transfer;Independently;ambulating;shower seat Additional ADL Goal #1: pt will be able to stand x10 min for functional task in order to improve activity tolerance for ADLs.  OT Frequency: Min 2X/week    Co-evaluation              AM-PAC OT "6 Clicks" Daily Activity     Outcome Measure Help from another person eating meals?: None (has ROM but is NPO at time of OT eval) Help from another person taking care of personal grooming?: A Little Help from another person toileting, which includes using toliet, bedpan, or urinal?: A Little Help from another person bathing (including washing, rinsing, drying)?: A Little Help from another person to put on and taking off regular  upper body clothing?: A Little Help from another person to put on and taking off regular lower body clothing?: A Little 6 Click Score: 19   End of Session Nurse Communication: Mobility status  Activity Tolerance: Patient tolerated treatment well Patient left: in bed;with call bell/phone within reach;with bed alarm set  OT Visit Diagnosis: Unsteadiness on feet (R26.81);Other abnormalities of gait and mobility (R26.89);Muscle weakness (generalized) (M62.81)                Time: 6945-0388 OT Time Calculation (min): 14 min Charges:  OT General Charges $OT Visit: 1 Visit OT Evaluation $OT Eval Moderate Complexity: 1 Mod  Natalyia Innes K, OTD, OTR/L SecureChat Preferred Acute Rehab [phone]   Dannya Pitkin K Koonce 08/14/2022, 10:14 AM

## 2022-08-14 NOTE — Consult Note (Incomplete)
NAME:  Ronald Thompson, MRN:  [id number], DOB:  [birth year], LOS: 0 ADMISSION DATE:  08/13/2022, CONSULTATION DATE:  08/14/22 REFERRING MD:  Dr. Florene Glen - [facility], CHIEF COMPLAINT: Pulm consult  History of Present Illness:  Ronald Thompson is a 67 year old male with a past medical history significant for neuroendocrine tumor of the pancreas with mets to the liver, asthma, aortic stenosis status post TAVR hypertension, hyperlipidemia, BPH, and chest seroma who presented to the emergency department 1/9 with complaints of increased shortness of breath.  Of note recent PET scan done December 2023 revealed increased size of hepatic tumor causing mass effect in the abdomen and small to moderate ascites.  Patient was admitted per hospitalist service for management of hypoxia.  PCCM consulted 1/10 for additional assistance in management  Pertinent  Medical History  Neuroendocrine tumor of the pancreas with mets to the liver, asthma, hypertension, hyperlipidemia, BPH, and chest seroma  Significant Hospital Events: Including procedures, antibiotic start and stop dates in addition to other pertinent events   1/9 admitted with complaints of shortness of breath found to be hypoxic at SNF 1/10 PCCM consulted Echocardiogram EF 65 to 70% with no regional wall motion abnormality and moderate concentric LV hypertrophy, severely enlarged right ventricular size and severely elevated pulmonary artery systolic pressure, estimated RV systolic pressure 62.8  Interim History / Subjective:  ***  Objective   Blood pressure 121/76, pulse 93, temperature 98 F (36.7 C), temperature source Oral, resp. rate 18, height '5\' 10"'$  (1.778 m), weight 73.1 kg, SpO2 92 %.        Intake/Output Summary (Last 24 hours) at 08/14/2022 1417 Last data filed at 08/14/2022 3151 Gross per 24 hour  Intake 150 ml  Output 1500 ml  Net -1350 ml   Filed Weights   08/13/22 1523 08/13/22 2204  Weight: 70.8 kg 73.1 kg    Examination: General:  *** HENT: *** Lungs: *** Cardiovascular: *** Abdomen: *** Extremities: *** Neuro: *** GU: ***  Resolved Hospital Problem list   ***  Assessment & Plan:  ***  Best Practice (right click and "Reselect all SmartList Selections" daily)   Diet/type: {diet type:25684} DVT prophylaxis: {anticoagulation (Optional):25687} GI prophylaxis: {VO:16073} Lines: {Central Venous Access:25771} Foley:  {Central Venous Access:25691} Code Status:  {Code Status:26939} Last date of multidisciplinary goals of care discussion [***]  Labs   CBC: Recent Labs  Lab 08/13/22 1546 08/13/22 2315  WBC 5.5 5.2  NEUTROABS  --  4.1  HGB 9.8* 10.2*  HCT 30.1* 30.0*  MCV 104.2* 102.0*  PLT 120* 121*    Basic Metabolic Panel: Recent Labs  Lab 08/13/22 1546 08/13/22 2315  NA 140 138  K 4.3 4.1  CL 101 96*  CO2 31 32  GLUCOSE 131* 110*  BUN 21 17  CREATININE 0.85 1.09  CALCIUM 9.0 9.1  MG  --  1.8  PHOS  --  3.5   GFR: Estimated Creatinine Clearance: 67 mL/min (by C-G formula based on SCr of 1.09 mg/dL). Recent Labs  Lab 08/13/22 1546 08/13/22 2315 08/14/22 0130  PROCALCITON  --  0.22  --   WBC 5.5 5.2  --   LATICACIDVEN  --  1.4 1.5    Liver Function Tests: Recent Labs  Lab 08/13/22 1546 08/13/22 2315  AST 29 37  ALT 20 24  ALKPHOS 128* 145*  BILITOT 1.5* 1.8*  PROT 6.2* 6.3*  ALBUMIN 3.6 3.3*   Recent Labs  Lab 08/13/22 1546  LIPASE 38   No results for input(s): "AMMONIA"  in the last 168 hours.  ABG    Component Value Date/Time   PHART 7.345 (L) 04/16/2016 1055   PCO2ART 54.9 (H) 04/16/2016 1055   PO2ART 86.0 04/16/2016 1055   HCO3 30.0 (H) 04/16/2016 1055   TCO2 32 04/16/2016 1055   O2SAT 96.0 04/16/2016 1055     Coagulation Profile: Recent Labs  Lab 08/13/22 1546 08/13/22 2315  INR 1.1 1.2    Cardiac Enzymes: No results for input(s): "CKTOTAL", "CKMB", "CKMBINDEX", "TROPONINI" in the last 168 hours.  HbA1C: Hemoglobin A1C  Date/Time Value Ref  Range Status  08/08/2021 02:43 PM 5.7 (A) 4.0 - 5.6 % Final  01/18/2021 10:48 AM 5.7 (A) 4.0 - 5.6 % Final   Hgb A1c MFr Bld  Date/Time Value Ref Range Status  05/09/2022 08:57 AM 6.2 4.6 - 6.5 % Final    Comment:    Glycemic Control Guidelines for People with Diabetes:Non Diabetic:  <6%Goal of Therapy: <7%Additional Action Suggested:  >8%   10/19/2020 12:01 PM 6.1 4.6 - 6.5 % Final    Comment:    Glycemic Control Guidelines for People with Diabetes:Non Diabetic:  <6%Goal of Therapy: <7%Additional Action Suggested:  >8%     CBG: Recent Labs  Lab 08/14/22 0434 08/14/22 0813 08/14/22 1212  GLUCAP 110* 99 88    Review of Systems:   ***  Past Medical History:  He,  has a past medical history of Baker's cyst, Gallstones, Heart murmur, Hypercholesteremia, Hypertension, Lesion of left lung, Liver metastasis, Occlusion of left subclavian vein (HCC) (1990), Pericarditis, Pneumonia (1990), Primary neuroendocrine tumor of pancreas (02/14/2014), Pulmonary fibrosis ([place]) (11/08/2015), S/P TAVR (transcatheter aortic valve replacement) (04/16/2016), Shortness of breath dyspnea, 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   Surgical History:   Past Surgical History:  Procedure Laterality Date   BACK SURGERY     '14- rupt. disc    CARDIAC CATHETERIZATION N/A 01/24/2016   Procedure: Right/Left Heart Cath and Coronary Angiography;  Surgeon: [name], MD;  Location: [place] [facility];  Service: Cardiovascular;  Laterality: N/A;   COLONOSCOPY     EUS N/A 02/10/2014   Procedure: UPPER ENDOSCOPIC ULTRASOUND (EUS) LINEAR;  Surgeon: [name], MD;  Location: [facility];  Service: Endoscopy;  Laterality: N/A;   IR IMAGING GUIDED PORT INSERTION  08/02/2022   IR RADIOLOGIST EVAL & MGMT  02/11/2017   KNEE SURGERY Right    age 88 for Baker's cyst   NASAL SEPTUM SURGERY     Deviated septrum   TEE WITHOUT CARDIOVERSION N/A 04/16/2016   Procedure: TRANSESOPHAGEAL ECHOCARDIOGRAM  (TEE);  Surgeon: [name], MD;  Location: [facility];  Service: Open Heart Surgery;  Laterality: N/A;   THORACOTOMY  1990   wedge biopsy of mediastinal mass   TRANSCATHETER AORTIC VALVE REPLACEMENT, TRANSFEMORAL N/A 04/16/2016   Procedure: TRANSCATHETER AORTIC VALVE REPLACEMENT, TRANSFEMORAL;  Surgeon: [name], MD;  Location: [place];  Service: Open Heart Surgery;  Laterality: N/A;     Social History:   reports that he has never smoked. He has never used smokeless tobacco. He reports current alcohol use. He reports that he does not use drugs.   Family History:  His family history includes Cancer in his maternal aunt and paternal grandfather; Dementia in his mother; Emphysema in his maternal aunt.   Allergies Allergies  Allergen Reactions   Penicillins Hives    ENTIRE BODY Has patient had a PCN reaction causing immediate rash, facial/tongue/throat swelling, SOB or lightheadedness with hypotension: No Has patient had  a PCN reaction causing severe rash involving mucus membranes or skin necrosis: No Has patient had a PCN reaction that required hospitalization * *  YES  * * Has patient had a PCN reaction occurring within the last 10 years: No If all of the above answers are "NO", then may proceed with Cephalosporin use. *reaction occurred when he was 17     Home Medications  Prior to Admission medications   Medication Sig Start Date End Date Taking? Authorizing Provider  Aromatic Inhalants (VICKS VAPOR INHALER IN) Inhale 1 spray into the lungs daily as needed (congestion).   Yes [provider]  Cholecalciferol (VITAMIN D-3) 125 MCG (5000 UT) TABS Take 1 tablet by mouth daily in the afternoon.   Yes [provider]  clindamycin (CLEOCIN) 300 MG capsule Take 2 capsules by mouth one hour prior to dental appointment 05/13/16  Yes [name], MD  diphenhydrAMINE (BENADRYL) 25 MG tablet Take 25-50 mg by mouth at bedtime.   Yes [provider]   lidocaine-prilocaine (EMLA) cream Apply to portacath site 1 hour prior to use 08/06/22  Yes Owens Shark, NP  loperamide (IMODIUM) 2 MG capsule Take 2 mg by mouth as needed for diarrhea or loose stools.   Yes [provider]  losartan (COZAAR) 100 MG tablet Take 1 tablet (100 mg total) by mouth daily. 11/06/21  Yes [name], MD  prochlorperazine (COMPAZINE) 10 MG tablet Take 1 tablet (10 mg total) by mouth every 6 (six) hours as needed for nausea or vomiting. 08/06/22  Yes Owens Shark, NP  simvastatin (ZOCOR) 20 MG tablet TAKE ONE TABLET BY MOUTH DAILY AT 8PM Patient taking differently: Take 20 mg by mouth daily at 6 PM. TAKE ONE TABLET BY MOUTH DAILY AT 8PM 11/06/21  Yes [name], MD  acetaminophen (TYLENOL) 500 MG tablet Take 1,000 mg by mouth every 6 (six) hours as needed for headache. Patient not taking: Reported on 08/14/2022    [provider]  albuterol (VENTOLIN HFA) 108 (90 Base) MCG/ACT inhaler Inhale 1-2 puffs into the lungs every 6 (six) hours as needed for wheezing or shortness of breath. Patient not taking: Reported on 08/14/2022 12/26/20   [name], MD  Lancet Device MISC Use as instructed to test blood sugar daily E11.65 10/13/19   Shamleffer, Melanie Crazier, MD  Lancets [facility] DELICA PLUS [id number]) [place] TEST ONCE DAILY 08/20/21   Shamleffer, Melanie Crazier, MD  octreotide (SANDOSTATIN LAR) 30 MG injection Inject 30 mg into the muscle every 28 (twenty-eight) days. Patient not taking: Reported on 08/14/2022    [provider]  oxymetazoline (AFRIN) 0.05 % nasal spray Place 2 sprays into both nostrils at bedtime as needed for congestion. Patient not taking: Reported on 08/14/2022    [provider]  tamsulosin (FLOMAX) 0.4 MG CAPS capsule Take 1 capsule (0.4 mg total) by mouth daily. Patient not taking: Reported on 08/14/2022 05/09/22   [name], MD     Critical care time: ***

## 2022-08-14 NOTE — Consult Note (Signed)
Chief Complaint: Patient was seen in consultation today for  Chief Complaint  Patient presents with   Shortness of Breath   at the request of Toy Baker, MD  Supervising Physician: Ruthann Cancer  Patient Status: [facility] - In-pt  History of Present Illness: Ronald Thompson is a 67 y.o. 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 During work up, CT revealed left pulmonary artery stenosis.  IR consulted for possible intervention.  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   Pericarditis    2nd to tumor   Pneumonia 1990   Primary neuroendocrine tumor of pancreas 02/14/2014   Stage IV with multiple mets to liver   Pulmonary fibrosis ([place]) 11/08/2015   S/P TAVR (transcatheter aortic valve replacement) 04/16/2016   26 mm Edwards Sapien 3 transcatheter heart valve placed via percutaneous right transfemoral approach    Shortness of breath dyspnea    with exertion and when tired   Testicular seminoma ([place]) 1990   with metatstatic spread - good response to therapy-radiation and chemotherapy   Transfusion history    hx. 25 yrs ago-during cancer tx.    Past Surgical History:  Procedure Laterality Date   BACK SURGERY     '14- rupt. disc    CARDIAC CATHETERIZATION N/A 01/24/2016   Procedure: Right/Left Heart Cath and Coronary Angiography;  Surgeon: [name], MD;  Location: [place] [facility];  Service: Cardiovascular;  Laterality:  N/A;   COLONOSCOPY     EUS N/A 02/10/2014   Procedure: UPPER ENDOSCOPIC ULTRASOUND (EUS) LINEAR;  Surgeon: [name], MD;  Location: [facility];  Service: Endoscopy;  Laterality: N/A;   IR IMAGING GUIDED PORT INSERTION  08/02/2022   IR RADIOLOGIST EVAL & MGMT  02/11/2017   KNEE SURGERY Right    age 89 for Baker's cyst   NASAL SEPTUM SURGERY     Deviated septrum   TEE WITHOUT CARDIOVERSION N/A 04/16/2016   Procedure: TRANSESOPHAGEAL ECHOCARDIOGRAM (TEE);  Surgeon: [name], MD;  Location: [place];  Service: Open Heart Surgery;  Laterality: N/A;   THORACOTOMY  1990   wedge biopsy of mediastinal mass   TRANSCATHETER AORTIC VALVE REPLACEMENT, TRANSFEMORAL N/A 04/16/2016   Procedure: TRANSCATHETER AORTIC VALVE REPLACEMENT, TRANSFEMORAL;  Surgeon: [name], MD;  Location: [place];  Service: Open Heart Surgery;  Laterality: N/A;    Allergies: Penicillins  Medications: Prior to Admission medications   Medication Sig Start Date End Date Taking? Authorizing Provider  Aromatic Inhalants (VICKS VAPOR INHALER IN) Inhale 1 spray into the lungs daily as needed (congestion).   Yes [provider]  Cholecalciferol (VITAMIN D-3) 125 MCG (5000 UT) TABS Take 1 tablet by mouth daily in the afternoon.   Yes [provider]  clindamycin (CLEOCIN) 300 MG capsule Take 2 capsules by mouth one hour prior to dental appointment 05/13/16  Yes [name], MD  diphenhydrAMINE (BENADRYL) 25 MG tablet Take  25-50 mg by mouth at bedtime.   Yes [provider]  lidocaine-prilocaine (EMLA) cream Apply to portacath site 1 hour prior to use 08/06/22  Yes Owens Shark, NP  loperamide (IMODIUM) 2 MG capsule Take 2 mg by mouth as needed for diarrhea or loose stools.   Yes [provider]  losartan (COZAAR) 100 MG tablet Take 1 tablet (100 mg total) by mouth daily. 11/06/21  Yes [name], MD  prochlorperazine (COMPAZINE) 10 MG tablet Take 1 tablet (10 mg total) by mouth every  6 (six) hours as needed for nausea or vomiting. 08/06/22  Yes Owens Shark, NP  simvastatin (ZOCOR) 20 MG tablet TAKE ONE TABLET BY MOUTH DAILY AT 8PM Patient taking differently: Take 20 mg by mouth daily at 6 PM. TAKE ONE TABLET BY MOUTH DAILY AT 8PM 11/06/21  Yes [name], MD  acetaminophen (TYLENOL) 500 MG tablet Take 1,000 mg by mouth every 6 (six) hours as needed for headache. Patient not taking: Reported on 08/14/2022    [provider]  albuterol (VENTOLIN HFA) 108 (90 Base) MCG/ACT inhaler Inhale 1-2 puffs into the lungs every 6 (six) hours as needed for wheezing or shortness of breath. Patient not taking: Reported on 08/14/2022 12/26/20   [name], MD  Lancet Device MISC Use as instructed to test blood sugar daily E11.65 10/13/19   Shamleffer, Melanie Crazier, MD  Lancets [facility] DELICA PLUS [id number]) [place] TEST ONCE DAILY 08/20/21   Shamleffer, Melanie Crazier, MD  octreotide (SANDOSTATIN LAR) 30 MG injection Inject 30 mg into the muscle every 28 (twenty-eight) days. Patient not taking: Reported on 08/14/2022    [provider]  oxymetazoline (AFRIN) 0.05 % nasal spray Place 2 sprays into both nostrils at bedtime as needed for congestion. Patient not taking: Reported on 08/14/2022    [provider]  tamsulosin (FLOMAX) 0.4 MG CAPS capsule Take 1 capsule (0.4 mg total) by mouth daily. Patient not taking: Reported on 08/14/2022 05/09/22   [name], MD     Family History  Problem Relation Age of Onset   Dementia Mother    Cancer Paternal Grandfather        esophagus   Cancer Maternal Aunt        colon   Emphysema Maternal Aunt     Social History   Socioeconomic History   Marital status: Married    Spouse name: Not on file   Number of children: 3   Years of education: 16   Highest education level: Not on file  Occupational History   Occupation: Administrator, sports: [employer]  Tobacco Use   Smoking status: Never   Smokeless tobacco:  Never  Vaping Use   Vaping Use: Never used  Substance and Sexual Activity   Alcohol use: Yes    Comment: rare- social   Drug use: No   Sexual activity: Yes    Partners: Female  Other Topics Concern   Not on file  Social History Narrative   [name]. 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 2 sons: 1979, 1985- married with 2 daughters 1 in route: Oldest in [place], younger [place], [place]. 1 daughter: 46 [place], married-1 granddaughter.Customer service manager- Nurse, mental health. SO- good health; marriage in good health   Enjoys watching movies, works out at home gym   Social Determinants of Health   Financial Resource Strain: Low Risk  (07/23/2022)   Overall Financial Resource Strain (CARDIA)    Difficulty of Paying Living Expenses: Not  hard at all  Food Insecurity: No Food Insecurity (07/23/2022)   Hunger Vital Sign    Worried About Running Out of Food in the Last Year: Never true    Ran Out of Food in the Last Year: Never true  Transportation Needs: No Transportation Needs (07/23/2022)   PRAPARE - Hydrologist (Medical): No    Lack of Transportation (Non-Medical): No  Physical Activity: Inactive (07/23/2022)   Exercise Vital Sign    Days of Exercise per Week: 0 days    Minutes of Exercise per Session: 0 min  Stress: No Stress Concern Present (07/23/2022)   [place]    Feeling of Stress : Not at all  Social Connections: [place] (07/23/2022)   Social Connection and Isolation Panel [NHANES]    Frequency of Communication with Friends and Family: More than three times a week    Frequency of Social Gatherings with Friends and Family: More than three times a week    Attends Religious Services: 1 to 4 times per year    Active Member of [employer] or Organizations: Yes    Attends Archivist Meetings: 1 to 4 times per year    Marital Status: Married    Review of Systems  Constitutional:  Positive for  activity change and fatigue.  Respiratory:  Positive for shortness of breath.   Cardiovascular:  Positive for chest pain and leg swelling.  Gastrointestinal:  Positive for abdominal distention.    Vital Signs: BP 134/74 (BP Location: Right Arm)   Pulse 91   Temp 97.8 F (36.6 C) (Oral)   Resp 17   Ht '5\' 10"'$  (1.778 m)   Wt 161 lb 3.2 oz (73.1 kg)   SpO2 92%   BMI 23.13 kg/m   Physical Exam Constitutional:      Appearance: He is ill-appearing.  HENT:     Head: Normocephalic and atraumatic.  Cardiovascular:     Rate and Rhythm: Normal rate.  Pulmonary:     Comments: Mildly labored breathing at rest with talking Neurological:     General: No focal deficit present.     Mental Status: He is alert and oriented to person, place, and time.  Psychiatric:        Mood and Affect: Mood normal.        Behavior: Behavior normal.    Imaging: CT ABDOMEN PELVIS W CONTRAST  Result Date: 08/13/2022 CLINICAL DATA:  History of liver Mets and cancer EXAM: CT ABDOMEN AND PELVIS WITH CONTRAST TECHNIQUE: Multidetector CT imaging of the abdomen and pelvis was performed using the standard protocol following bolus administration of intravenous contrast. RADIATION DOSE REDUCTION: This exam was performed according to the departmental dose-optimization program which includes automated exposure control, adjustment of the mA and/or kV according to patient size and/or use of iterative reconstruction technique. CONTRAST:  141m OMNIPAQUE IOHEXOL 350 MG/ML SOLN COMPARISON:  PET CT 07/15/2022, 03/28/2022, CT 02/29/2016 FINDINGS: Lower chest: Lung bases demonstrate small bilateral pleural effusions. Partial atelectasis or pneumonia in the right lower lobe clear cardiomegaly with aortic valve prosthesis. Similar elevation left diaphragm. Hepatobiliary: Markedly enlarged liver. Numerous hepatic metastatic lesions. Left hepatic lobe mass measures 10.1 x 10.2 cm, previously 10.1 x 10.2 cm. Dominant right hepatic mass  measures 15.1 x 12.3 cm, previous 15.1 cm maximum. Inferior left hepatic mass measuring 11.2 cm compared with 11.2 cm previously. Numerous additional large hepatic masses. Pancreas: Displaced to the left upper quadrant  by hepatic masses. No acute inflammatory process. Spleen: Normal in size without focal abnormality. Adrenals/Urinary Tract: Adrenal glands are within normal limits. The kidneys show no hydronephrosis. The bladder is unremarkable Stomach/Bowel: The stomach is nonenlarged. No dilated small bowel. Bowel is displaced to the left upper quadrant by enlarged liver. No acute bowel wall thickening. Vascular/Lymphatic: Moderate aortic atherosclerosis. No aneurysm. No suspicious lymph nodes. Mass effect on hepatic vessels by multiple large liver masses. Reproductive: Prostate is slightly enlarged Other: No free air.  No significant pelvic effusion. Musculoskeletal: No acute osseous abnormality. Small foci of sclerosis within the left iliac bone without significant change IMPRESSION: 1. Markedly enlarged liver with numerous large hepatic metastatic lesions without gross interval change in size as compared with PET CT from December. 2. Small bilateral pleural effusions with partial atelectasis or pneumonia in the right lower lobe. Cardiomegaly. 3. Aortic atherosclerosis. Aortic Atherosclerosis (ICD10-I70.0). Electronically Signed   By: Donavan Foil M.D.   On: 08/13/2022 17:52   CT Angio Chest PE W and/or Wo Contrast  Result Date: 08/13/2022 CLINICAL DATA:  Shortness of breath for several days. Hypoxic. No neoplasm with liver metastases. Neuroendocrine tumor. On chemotherapy. EXAM: CT ANGIOGRAPHY CHEST WITH CONTRAST TECHNIQUE: Multidetector CT imaging of the chest was performed using the standard protocol during bolus administration of intravenous contrast. Multiplanar CT image reconstructions and MIPs were obtained to evaluate the vascular anatomy. RADIATION DOSE REDUCTION: This exam was performed according to  the departmental dose-optimization program which includes automated exposure control, adjustment of the mA and/or kV according to patient size and/or use of iterative reconstruction technique. CONTRAST:  153m OMNIPAQUE IOHEXOL 350 MG/ML SOLN COMPARISON:  Chest x-ray earlier 08/13/2022. CT angiogram 01/17/22 older exams well. FINDINGS: Cardiovascular: The heart is enlarged. Particularly involving the right atrium with some reflux of contrast into the hepatic veins. Status post TAVR. Normal caliber thoracic aorta otherwise with some scattered mild plaque. Small pericardial effusion. Right IJ chest port. Some enlargement of the pulmonary artery. Please correlate for any evidence of pulmonary arterial hypertension. There is a focal stricture along the proximal left main pulmonary artery. There is differences in opacification from the contrast injection proximal and distal to the stricture. Appearance suggests a high-grade stenosis. Please correlate for any known history. There are areas of peripheral arterial branches in the left lung which have suboptimal enhancement. Calcifications are seen along the main pulmonary artery origin. With the motion and the contrast enhancement no large, central pulmonary embolism identified. Mediastinum/Nodes: No specific abnormal lymph node enlargement present in the axillary region, hilum. A few small mediastinal nodes are seen, not pathologic by size criteria. Lungs/Pleura: Tiny bilateral pleural effusions are seen, right greater than left. Adjacent parenchymal opacity is identified. There are scattered ground-glass. Areas of interstitial septal thickening. More bandlike areas in the lingula likely again atelectatic or scar. The right effusion is new from previous. The adjacent opacity is also new. Diffuse breathing motion. Upper Abdomen: Please see separate dictation of the abdomen and pelvis CT from the same day. Musculoskeletal: Anasarca. Scattered degenerative changes along the  spine. Review of the MIP images confirms the above findings. IMPRESSION: Status post TAVR.  Enlarged heart. There is calcifications along the main pulmonary artery with dilated pulmonary arterial vessels diffusely. There is also what appears to be a high-grade stricture or stenosis involving the origin of the left main pulmonary artery with variant enhancement of the pulmonary artery distal to the stenosis. Please correlate with any known history or further workup is recommended. Development of  a small right pleural effusion with adjacent parenchymal opacity. Atelectasis versus infiltrate. Stable tiny left effusion. Areas of interstitial septal thickening. Chest port. Please see separate dictation of abdomen and pelvis CT. Aortic Atherosclerosis (ICD10-I70.0). Electronically Signed   By: Jill Side M.D.   On: 08/13/2022 17:39   DG Chest Port 1 View  Result Date: 08/13/2022 CLINICAL DATA:  Shortness of breath. EXAM: PORTABLE CHEST 1 VIEW COMPARISON:  Chest radiograph November 19, 2020 FINDINGS: Port-A-Cath tip projects over the superior vena cava. Monitoring leads overlie the patient. Stable cardiac and mediastinal contours. Elevation left hemidiaphragm. Scattered predominately lower lung heterogeneous opacities. No pleural effusion or pneumothorax. Thoracic spine degenerative changes. IMPRESSION: Scattered lower lung heterogeneous opacities may represent atelectasis or infection. Electronically Signed   By: Lovey Newcomer M.D.   On: 08/13/2022 16:19   IR IMAGING GUIDED PORT INSERTION  Result Date: 08/02/2022 CLINICAL DATA:  Metastatic neuroendocrine tumor, access for chemotherapy EXAM: RIGHT INTERNAL JUGULAR SINGLE LUMEN POWER PORT CATHETER INSERTION Date:  08/02/2022 08/02/2022 10:15 am Radiologist:  M. Daryll Brod, MD Guidance:  Ultrasound and fluoroscopic MEDICATIONS: 1% lidocaine local with epinephrine ANESTHESIA/SEDATION: Versed 1.5 mg IV; Fentanyl 75 mcg IV; Moderate Sedation Time:  21 minute The patient  was continuously monitored during the procedure by the interventional radiology nurse under my direct supervision. FLUOROSCOPY: 0 minutes, 42 seconds (3 mGy) COMPLICATIONS: None immediate. CONTRAST:  None. PROCEDURE: Informed consent was obtained from the patient following explanation of the procedure, risks, benefits and alternatives. The patient understands, agrees and consents for the procedure. All questions were addressed. A time out was performed. Maximal barrier sterile technique utilized including caps, mask, sterile gowns, sterile gloves, large sterile drape, hand hygiene, and 2% chlorhexidine scrub. Under sterile conditions and local anesthesia, right internal jugular micropuncture venous access was performed. Access was performed with ultrasound. Images were obtained for documentation of the patent right internal jugular vein. A guide wire was inserted followed by a transitional dilator. This allowed insertion of a guide wire and catheter into the IVC. Measurements were obtained from the SVC / RA junction back to the right IJ venotomy site. In the right infraclavicular chest, a subcutaneous pocket was created over the second anterior rib. This was done under sterile conditions and local anesthesia. 1% lidocaine with epinephrine was utilized for this. A 2.5 cm incision was made in the skin. Blunt dissection was performed to create a subcutaneous pocket over the right pectoralis major muscle. The pocket was flushed with saline vigorously. There was adequate hemostasis. The port catheter was assembled and checked for leakage. The port catheter was secured in the pocket with two retention sutures. The tubing was tunneled subcutaneously to the right venotomy site and inserted into the SVC/RA junction through a valved peel-away sheath. Position was confirmed with fluoroscopy. Images were obtained for documentation. The patient tolerated the procedure well. No immediate complications. Incisions were closed in a  two layer fashion with 4 - 0 Vicryl suture. Dermabond was applied to the skin. The port catheter was accessed, blood was aspirated followed by saline and heparin flushes. Needle was removed. A dry sterile dressing was applied. IMPRESSION: Ultrasound and fluoroscopically guided right internal jugular single lumen power port catheter insertion. Tip in the SVC/RA junction. Catheter ready for use. Electronically Signed   By: Jerilynn Mages.  Shick M.D.   On: 08/02/2022 10:49    Labs:  CBC: Recent Labs    07/18/22 0812 08/06/22 0916 08/13/22 1546 08/13/22 2315  WBC 4.1 5.8 5.5 5.2  HGB  11.8* 10.9* 9.8* 10.2*  HCT 35.7* 33.3* 30.1* 30.0*  PLT 131* 134* 120* 121*    COAGS: Recent Labs    10/16/21 0821 08/13/22 1546 08/13/22 2315  INR 1.0 1.1 1.2  APTT  --   --  36    BMP: Recent Labs    07/18/22 0812 08/06/22 0916 08/13/22 1546 08/13/22 2315  NA 138 141 140 138  K 4.4 4.1 4.3 4.1  CL 101 104 101 96*  CO2 '28 28 31 '$ 32  GLUCOSE 103* 102* 131* 110*  BUN '21 23 21 17  '$ CALCIUM 9.2 9.3 9.0 9.1  CREATININE 1.09 0.85 0.85 1.09  GFRNONAA >60 >60 >60 >60    LIVER FUNCTION TESTS: Recent Labs    07/18/22 0812 08/06/22 0916 08/13/22 1546 08/13/22 2315  BILITOT 1.3* 1.8* 1.5* 1.8*  AST 38 32 29 37  ALT '22 18 20 24  '$ ALKPHOS 100 104 128* 145*  PROT 6.5 6.6 6.2* 6.3*  ALBUMIN 3.9 4.0 3.6 3.3*     Assessment and Plan:  Shortness of breath in the setting of Pulmonary Artery Stenosis: not a new finding.  Studies dating back to 2015 demonstrate its presence, though, with some progression.  Patient consulted along with Dr. Serafina Royals.  Given acute onset of symptoms and chronicity of condition, it is felt less likely that symptoms are directly related to the stenosis.  Additionally, patient reports radiation to the chest in the 90's associated with Seminoma treatment.  Suspect the stenosis is at least in part a result of the radiation.  Any attempt at balloon angioplasty would not be recommended due to  the higher risk of vascular injury, leading to bleeding and possibly death.  No intervention from IR at this time.  Thank you for this interesting consult.  I greatly enjoyed meeting [employer].  A copy of this report was sent to the requesting provider on this date.  Electronically Signed: Pasty Spillers, PA 08/14/2022, 10:02 AM   I spent a total of 20 Minutes in face to face in clinical consultation, greater than 50% of which was counseling/coordinating care for pulmonary artery stenosis

## 2022-08-14 NOTE — Evaluation (Signed)
Physical Therapy Evaluation Patient Details Name: Ronald Thompson MRN: [id number] DOB: 05-25-1956 Today's Date: 08/14/2022  History of Present Illness  Pt is a 67 y/o M presenting to ED from [place] on 1/9 with SOB x several days, admitted for acute on chronic CHF. PMH includes neuroendocrine tumor with mets to liver, asthma, HLD, HTN, and BPH.  Clinical Impression  Pt was seen for mobility on side of bed and to walk in the room with vitals as follow pregait:  Supine values: 121/76 (88), sat 92% and pulse 93; sitting 127/76, sat 91% pulse 100, standing 136/80, pulse 98, sat 93%, post walk 100% sat.  Note that on room air did not desaturate, no symptoms of dizziness or light headed feelings and was comfortable albeit tired to walk. Per MD pt may be leaving tomorrow, and will try to have PT see pre-discharge but note that he is able to walk without AD on room air safely and independently with daughter available to assist for now.  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  Follow acutely as needed to promote safe independent gait with no expectation of equipment needed and no further PT services at home.       Recommendations for follow up therapy are one component of a multi-disciplinary discharge planning process, led by the attending physician.  Recommendations may be updated based on patient status, additional functional criteria and insurance authorization.  Follow Up Recommendations No PT follow up      Assistance Recommended at Discharge Set up Supervision/Assistance  Patient can return home with the following  Assistance with cooking/housework;Assist for transportation;Help with stairs or ramp for entrance    Equipment Recommendations None recommended by PT (has a walker from family he wishes to borrow when needed)  Recommendations for Other Services       Functional Status Assessment Patient has had a recent decline in their functional status and demonstrates the  ability to make significant improvements in function in a reasonable and predictable amount of time.     Precautions / Restrictions Precautions Precautions: Fall Restrictions Weight Bearing Restrictions: No      Mobility  Bed Mobility Overal bed mobility: Needs Assistance Bed Mobility: Supine to Sit, Sit to Supine     Supine to sit: Supervision Sit to supine: Supervision        Transfers Overall transfer level: Needs assistance Equipment used: None Transfers: Sit to/from Stand Sit to Stand: Min guard           General transfer comment: min guard as bed was placed in low posture    Ambulation/Gait Ambulation/Gait assistance: Supervision Gait Distance (Feet): 50 Feet Assistive device: None Gait Pattern/deviations: Step-through pattern, Wide base of support, Knee flexed in stance - right, Knee flexed in stance - left Gait velocity: reduced Gait velocity interpretation: <1.31 ft/sec, indicative of household ambulator Pre-gait activities: standing balance ck General Gait Details: mild flexion posture on knees as pt is somewhat fatigued with the effort, has not eaten in a couple days  Stairs            Wheelchair Mobility    Modified Rankin (Stroke Patients Only)       Balance Overall balance assessment: Mild deficits observed, not formally tested  Pertinent Vitals/Pain Pain Assessment Pain Assessment: Faces Faces Pain Scale: No hurt    Home Living Family/patient expects to be discharged to:: Private residence Living Arrangements: Spouse/significant other Available Help at Discharge: Family;Available 24 hours/day Type of Home: House Home Access: Level entry;Elevator       Home Layout: One level Home Equipment: None Additional Comments: fully accessible environment, caregiver for his wife    Prior Function Prior Level of Function : Independent/Modified Independent;Driving              Mobility Comments: no AD       Hand Dominance   Dominant Hand: Right    Extremity/Trunk Assessment   Upper Extremity Assessment Upper Extremity Assessment: Defer to OT evaluation    Lower Extremity Assessment Lower Extremity Assessment: Generalized weakness    Cervical / Trunk Assessment Cervical / Trunk Assessment: Normal  Communication   Communication: No difficulties  Cognition Arousal/Alertness: Awake/alert Behavior During Therapy: WFL for tasks assessed/performed Overall Cognitive Status: Within Functional Limits for tasks assessed                                          General Comments General comments (skin integrity, edema, etc.): did full set of vitals including walking sat:  Supine values:   121/76 (88), sat 92% and pulse 93;  sitting 127/76, sat 91% pulse 100, standing 136/80, pulse 98, sat 93%;  post walk 100% sat    Exercises     Assessment/Plan    PT Assessment Patient needs continued PT services  PT Problem List Decreased strength;Decreased activity tolerance;Decreased balance;Decreased mobility       PT Treatment Interventions DME instruction;Gait training;Functional mobility training;Therapeutic activities;Therapeutic exercise;Balance training;Neuromuscular re-education;Patient/family education    PT Goals (Current goals can be found in the Care Plan section)  Acute Rehab PT Goals Patient Stated Goal: to feel stronger for home PT Goal Formulation: With patient Time For Goal Achievement: 08/21/22 Potential to Achieve Goals: Good    Frequency Min 3X/week     Co-evaluation               AM-PAC PT "6 Clicks" Mobility  Outcome Measure Help needed turning from your back to your side while in a flat bed without using bedrails?: None Help needed moving from lying on your back to sitting on the side of a flat bed without using bedrails?: None Help needed moving to and from a bed to a chair (including a wheelchair)?:  A Little Help needed standing up from a chair using your arms (e.g., wheelchair or bedside chair)?: A Little Help needed to walk in hospital room?: A Little Help needed climbing 3-5 steps with a railing? : A Little 6 Click Score: 20    End of Session   Activity Tolerance: Patient tolerated treatment well Patient left: in bed;with call bell/phone within reach;with nursing/sitter in room Nurse Communication: Mobility status;Other (comment) (mobility vitals) PT Visit Diagnosis: Unsteadiness on feet (R26.81);Muscle weakness (generalized) (M62.81);Difficulty in walking, not elsewhere classified (R26.2)    Time: 1610-9604 PT Time Calculation (min) (ACUTE ONLY): 35 min   Charges:   PT Evaluation $PT Eval Moderate Complexity: 1 Mod PT Treatments $Gait Training: 8-22 mins       Ramond Dial 08/14/2022, 1:09 PM  Mee Hives, PT PhD Acute Rehab Dept. Number: [place]

## 2022-08-14 NOTE — Assessment & Plan Note (Signed)
Continue to cycle  Order echo in AM

## 2022-08-14 NOTE — Progress Notes (Signed)
  Echocardiogram 2D Echocardiogram has been performed.  Darlina Sicilian M 08/14/2022, 9:45 AM

## 2022-08-15 DIAGNOSIS — I509 Heart failure, unspecified: Secondary | ICD-10-CM

## 2022-08-15 DIAGNOSIS — I5033 Acute on chronic diastolic (congestive) heart failure: Secondary | ICD-10-CM | POA: Diagnosis not present

## 2022-08-15 LAB — BASIC METABOLIC PANEL
Anion gap: 12 (ref 5–15)
BUN: 24 mg/dL — ABNORMAL HIGH (ref 8–23)
CO2: 30 mmol/L (ref 22–32)
Calcium: 8.9 mg/dL (ref 8.9–10.3)
Chloride: 95 mmol/L — ABNORMAL LOW (ref 98–111)
Creatinine, Ser: 1.24 mg/dL (ref 0.61–1.24)
GFR, Estimated: >60 mL/min (ref >60)
Glucose, Bld: 99 mg/dL (ref 70–99)
Potassium: 3.9 mmol/L (ref 3.5–5.1)
Sodium: 137 mmol/L (ref 135–145)

## 2022-08-15 LAB — MAGNESIUM: Magnesium: 2 mg/dL (ref 1.7–2.4)

## 2022-08-15 LAB — PROCALCITONIN: Procalcitonin: 0.19 ng/mL

## 2022-08-15 LAB — PHOSPHORUS: Phosphorus: 5 mg/dL — ABNORMAL HIGH (ref 2.5–4.6)

## 2022-08-15 MED ORDER — TORSEMIDE 20 MG PO TABS
20.0000 mg | ORAL_TABLET | Freq: Every day | ORAL | Status: DC
  Administered 2022-08-15: 20 mg via ORAL
  Filled 2022-08-15: qty 1

## 2022-08-15 MED ORDER — TORSEMIDE 20 MG PO TABS: 20.0000 mg | ORAL_TABLET | Freq: Every day | ORAL | 0 refills | Status: DC

## 2022-08-15 MED ORDER — HEPARIN SOD (PORK) LOCK FLUSH 100 UNIT/ML IV SOLN
500.0000 [IU] | Freq: Once | INTRAVENOUS | Status: AC
  Administered 2022-08-15: 500 [IU] via INTRAVENOUS
  Filled 2022-08-15: qty 5

## 2022-08-15 MED ORDER — ACETAMINOPHEN 500 MG PO TABS: 1000.0000 mg | ORAL_TABLET | Freq: Three times a day (TID) | ORAL | 0 refills | Status: DC | PRN

## 2022-08-15 NOTE — Progress Notes (Signed)
Rounding Note    Patient Name: Ronald Thompson Date of Encounter: 08/15/2022  [place] Cardiologist: Mertie Moores, MD   Subjective   Mr Pondexter feels better today, ready to go home. Renal function is normal  Inpatient Medications    Scheduled Meds:  Chlorhexidine Gluconate Cloth  6 each Topical Q0600   feeding supplement  237 mL Oral BID BM   multivitamin with minerals  1 tablet Oral Daily   oxymetazoline  1 spray Each Nare BID   simvastatin  10 mg Oral q1800   sodium chloride flush  3 mL Intravenous Q12H   tamsulosin  0.4 mg Oral Daily   Continuous Infusions:  sodium chloride     PRN Meds: sodium chloride, acetaminophen **OR** acetaminophen, albuterol, HYDROcodone-acetaminophen, sodium chloride flush   Vital Signs    Vitals:   08/14/22 2016 08/14/22 2348 08/15/22 0115 08/15/22 0417  BP: 104/71 119/77  112/74  Pulse: (!) 107 98  90  Resp: '16 19  18  '$ Temp: 98 F (36.7 C) 97.8 F (36.6 C)  98.3 F (36.8 C)  TempSrc: Oral Oral  Oral  SpO2: 91% 96%  96%  Weight:  70.9 kg 70.9 kg 70.9 kg  Height:        Intake/Output Summary (Last 24 hours) at 08/15/2022 0924 Last data filed at 08/14/2022 2352 Gross per 24 hour  Intake 240 ml  Output 2150 ml  Net -1910 ml      08/15/2022    4:17 AM 08/15/2022    1:15 AM 08/14/2022   11:48 PM  Last 3 Weights  Weight (lbs) 156 lb 4.9 oz 156 lb 4.9 oz 156 lb 4.9 oz  Weight (kg) 70.9 kg 70.9 kg 70.9 kg      Telemetry    NSR - Personally Reviewed  ECG    No new - Personally Reviewed  Physical Exam   Vitals:   08/14/22 2348 08/15/22 0417  BP: 119/77 112/74  Pulse: 98 90  Resp: 19 18  Temp: 97.8 F (36.6 C) 98.3 F (36.8 C)  SpO2: 96% 96%    GEN: No acute distress.   Neck: No JVD Cardiac: RRR, loud P2, rubs, or gallops.  Respiratory: Clear to auscultation bilaterally. GI: Soft, nontender, non-distended  MS: No edema; No deformity. Neuro:  Nonfocal  Psych: Normal affect   Labs    High Sensitivity  Troponin:   Recent Labs  Lab 08/13/22 2315 08/14/22 0344 08/14/22 0612  TROPONINIHS 33* 30* 30*     Chemistry Recent Labs  Lab 08/13/22 1546 08/13/22 2315 08/14/22 1610 08/15/22 0130 08/15/22 0737  NA 140 138 139  --  137  K 4.3 4.1 4.4  --  3.9  CL 101 96* 96*  --  95*  CO2 31 32 33*  --  30  GLUCOSE 131* 110* 135*  --  99  BUN '21 17 22  '$ --  24*  CREATININE 0.85 1.09 1.43*  --  1.24  CALCIUM 9.0 9.1 9.2  --  8.9  MG  --  1.8  --  2.0  --   PROT 6.2* 6.3*  --   --   --   ALBUMIN 3.6 3.3*  --   --   --   AST 29 37  --   --   --   ALT 20 24  --   --   --   ALKPHOS 128* 145*  --   --   --   BILITOT 1.5* 1.8*  --   --   --  GFRNONAA >60 >60 53*  --  >60  ANIONGAP '8 10 10  '$ --  12    Lipids No results for input(s): "CHOL", "TRIG", "HDL", "LABVLDL", "LDLCALC", "CHOLHDL" in the last 168 hours.  Hematology Recent Labs  Lab 08/13/22 1546 08/13/22 2315  WBC 5.5 5.2  RBC 2.89* 2.94*  HGB 9.8* 10.2*  HCT 30.1* 30.0*  MCV 104.2* 102.0*  MCH 33.9 34.7*  MCHC 32.6 34.0  RDW 14.2 13.9  PLT 120* 121*   Thyroid  Recent Labs  Lab 08/13/22 2315  TSH 1.980    BNP Recent Labs  Lab 08/13/22 1546  BNP 1,164.3*    DDimer No results for input(s): "DDIMER" in the last 168 hours.   Radiology    ECHOCARDIOGRAM COMPLETE  Result Date: 08/14/2022    ECHOCARDIOGRAM REPORT   Patient Name:   Ronald Thompson Date of Exam: 08/14/2022 Medical Rec #:  [id number]   Height:       70.0 in Accession #:    [id number]  Weight:       161.2 lb Date of Birth:  1955/09/26   BSA:          1.905 m Patient Age:    78 years    BP:           134/74 mmHg Patient Gender: M           HR:           89 bpm. Exam Location:  Inpatient Procedure: 2D Echo, Cardiac Doppler and Color Doppler Indications:    Pulmonic valve disease I37.9  History:        Patient has prior history of Echocardiogram examinations, most                 recent 12/18/2021. Pulmonary fibrosis, Aortic Valve Disease and                 Mitral  Valve Disease, Signs/Symptoms:Murmur; Risk                 Factors:Hypertension and Dyslipidemia. Cancer.                 Aortic Valve: 26 mm Sapien prosthetic, stented (TAVR) valve is                 present in the aortic position. Procedure Date: 04/16/2016.  Sonographer:    Darlina Sicilian RDCS Referring Phys: [place]  1. Left ventricular ejection fraction, by estimation, is 65 to 70%. The left ventricle has normal function. The left ventricle has no regional wall motion abnormalities. There is moderate concentric left ventricular hypertrophy. Left ventricular diastolic parameters are indeterminate. There is the interventricular septum is flattened in systole, consistent with right ventricular pressure overload.  2. Right ventricular systolic function is mildly reduced. The right ventricular size is severely enlarged. There is severely elevated pulmonary artery systolic pressure. The estimated right ventricular systolic pressure is 62.2 mmHg.  3. Right atrial size was mild to moderately dilated.  4. The mitral valve is degenerative. Mild mitral valve regurgitation. Severe mitral stenosis. The mean mitral valve gradient is 10.0 mmHg with average heart rate of 89 bpm. Severe mitral annular calcification.  5. Tricuspid valve regurgitation is severe.  6. The aortic valve has been repaired/replaced. Aortic valve regurgitation is trivial, valvular and paravalvular. There is a 26 mm Sapien prosthetic (TAVR) valve present in the aortic position. Procedure Date: 04/16/2016. Aortic valve area, by VTI measures 3.10 cm. Aortic valve mean  gradient measures 5.2 mmHg. Aortic valve Vmax measures 1.60 m/s.  7. Pulmonic valve regurgitation is moderate.  8. There is Moderate (Grade III) atheroma plaque involving the ascending aorta.  9. The inferior vena cava is normal in size with <50% respiratory variability, suggesting right atrial pressure of 8 mmHg. FINDINGS  Left Ventricle: Left ventricular  ejection fraction, by estimation, is 65 to 70%. The left ventricle has normal function. The left ventricle has no regional wall motion abnormalities. The left ventricular internal cavity size was small. There is moderate  concentric left ventricular hypertrophy. The interventricular septum is flattened in systole, consistent with right ventricular pressure overload. Left ventricular diastolic parameters are indeterminate. Right Ventricle: The right ventricular size is severely enlarged. No increase in right ventricular wall thickness. Right ventricular systolic function is mildly reduced. There is severely elevated pulmonary artery systolic pressure. The tricuspid regurgitant velocity is 4.14 m/s, and with an assumed right atrial pressure of 8 mmHg, the estimated right ventricular systolic pressure is 66.0 mmHg. Left Atrium: Left atrial size was normal in size. Right Atrium: Right atrial size was mild to moderately dilated. Pericardium: There is no evidence of pericardial effusion. Mitral Valve: The mitral valve is degenerative in appearance. There is severe thickening of the mitral valve leaflet(s). There is severe calcification of the mitral valve leaflet(s). Severe mitral annular calcification. Mild mitral valve regurgitation. Severe mitral valve stenosis. MV peak gradient, 19.2 mmHg. The mean mitral valve gradient is 10.0 mmHg with average heart rate of 89 bpm. Tricuspid Valve: The tricuspid valve is grossly normal. Tricuspid valve regurgitation is severe. The flow in the hepatic veins is reversed during ventricular systole. Aortic Valve: The aortic valve has been repaired/replaced. Aortic valve regurgitation is trivial. Aortic valve mean gradient measures 5.2 mmHg. Aortic valve peak gradient measures 10.2 mmHg. Aortic valve area, by VTI measures 3.10 cm. There is a 26 mm Sapien prosthetic, stented (TAVR) valve present in the aortic position. Procedure Date: 04/16/2016. Pulmonic Valve: The pulmonic valve was  grossly normal. Pulmonic valve regurgitation is moderate. No evidence of pulmonic stenosis. Aorta: The aortic root and ascending aorta are structurally normal, with no evidence of dilitation. There is moderate (Grade III) atheroma plaque involving the ascending aorta. Venous: The inferior vena cava is normal in size with less than 50% respiratory variability, suggesting right atrial pressure of 8 mmHg. IAS/Shunts: No atrial level shunt detected by color flow Doppler.  LEFT VENTRICLE PLAX 2D LVIDd:         3.30 cm   Diastology LVIDs:         2.30 cm   LV e' medial:    5.33 cm/s LV PW:         1.30 cm   LV E/e' medial:  33.2 LV IVS:        1.50 cm   LV e' lateral:   6.96 cm/s LVOT diam:     2.50 cm   LV E/e' lateral: 25.4 LV SV:         86 LV SV Index:   45 LVOT Area:     4.91 cm  RIGHT VENTRICLE RV Basal diam:  5.10 cm RV Mid diam:    3.90 cm RV S prime:     10.90 cm/s TAPSE (M-mode): 1.8 cm LEFT ATRIUM           Index        RIGHT ATRIUM           Index LA diam:  3.80 cm 2.00 cm/m   RA Area:     20.00 cm LA Vol (A4C): 59.6 ml 31.29 ml/m  RA Volume:   57.40 ml  30.14 ml/m  AORTIC VALVE                     PULMONIC VALVE AV Area (Vmax):    3.11 cm      PR End Diast Vel: 2.16 msec AV Area (Vmean):   3.06 cm AV Area (VTI):     3.10 cm AV Vmax:           159.64 cm/s AV Vmean:          105.119 cm/s AV VTI:            0.278 m AV Peak Grad:      10.2 mmHg AV Mean Grad:      5.2 mmHg LVOT Vmax:         101.00 cm/s LVOT Vmean:        65.600 cm/s LVOT VTI:          0.176 m LVOT/AV VTI ratio: 0.63  AORTA Ao Root diam: 2.80 cm Ao Asc diam:  3.20 cm MITRAL VALVE                TRICUSPID VALVE MV Area (PHT): 2.91 cm     TR Peak grad:   68.6 mmHg MV Area VTI:   1.57 cm     TR Vmax:        414.00 cm/s MV Peak grad:  19.2 mmHg MV Mean grad:  10.0 mmHg    SHUNTS MV Vmax:       2.19 m/s     Systemic VTI:  0.18 m MV Vmean:      147.0 cm/s   Systemic Diam: 2.50 cm MV Decel Time: 261 msec MV E velocity: 177.00 cm/s MV A  velocity: 209.00 cm/s MV E/A ratio:  0.85 Cherlynn Kaiser MD Electronically signed by Cherlynn Kaiser MD Signature Date/Time: 08/14/2022/11:07:56 AM    Final    CT ABDOMEN PELVIS W CONTRAST  Result Date: 08/13/2022 CLINICAL DATA:  History of liver Mets and cancer EXAM: CT ABDOMEN AND PELVIS WITH CONTRAST TECHNIQUE: Multidetector CT imaging of the abdomen and pelvis was performed using the standard protocol following bolus administration of intravenous contrast. RADIATION DOSE REDUCTION: This exam was performed according to the departmental dose-optimization program which includes automated exposure control, adjustment of the mA and/or kV according to patient size and/or use of iterative reconstruction technique. CONTRAST:  165m OMNIPAQUE IOHEXOL 350 MG/ML SOLN COMPARISON:  PET CT 07/15/2022, 03/28/2022, CT 02/29/2016 FINDINGS: Lower chest: Lung bases demonstrate small bilateral pleural effusions. Partial atelectasis or pneumonia in the right lower lobe clear cardiomegaly with aortic valve prosthesis. Similar elevation left diaphragm. Hepatobiliary: Markedly enlarged liver. Numerous hepatic metastatic lesions. Left hepatic lobe mass measures 10.1 x 10.2 cm, previously 10.1 x 10.2 cm. Dominant right hepatic mass measures 15.1 x 12.3 cm, previous 15.1 cm maximum. Inferior left hepatic mass measuring 11.2 cm compared with 11.2 cm previously. Numerous additional large hepatic masses. Pancreas: Displaced to the left upper quadrant by hepatic masses. No acute inflammatory process. Spleen: Normal in size without focal abnormality. Adrenals/Urinary Tract: Adrenal glands are within normal limits. The kidneys show no hydronephrosis. The bladder is unremarkable Stomach/Bowel: The stomach is nonenlarged. No dilated small bowel. Bowel is displaced to the left upper quadrant by enlarged liver. No acute bowel wall thickening. Vascular/Lymphatic: Moderate aortic atherosclerosis. No aneurysm.  No suspicious lymph nodes. Mass effect  on hepatic vessels by multiple large liver masses. Reproductive: Prostate is slightly enlarged Other: No free air.  No significant pelvic effusion. Musculoskeletal: No acute osseous abnormality. Small foci of sclerosis within the left iliac bone without significant change IMPRESSION: 1. Markedly enlarged liver with numerous large hepatic metastatic lesions without gross interval change in size as compared with PET CT from December. 2. Small bilateral pleural effusions with partial atelectasis or pneumonia in the right lower lobe. Cardiomegaly. 3. Aortic atherosclerosis. Aortic Atherosclerosis (ICD10-I70.0). Electronically Signed   By: Donavan Foil M.D.   On: 08/13/2022 17:52   CT Angio Chest PE W and/or Wo Contrast  Result Date: 08/13/2022 CLINICAL DATA:  Shortness of breath for several days. Hypoxic. No neoplasm with liver metastases. Neuroendocrine tumor. On chemotherapy. EXAM: CT ANGIOGRAPHY CHEST WITH CONTRAST TECHNIQUE: Multidetector CT imaging of the chest was performed using the standard protocol during bolus administration of intravenous contrast. Multiplanar CT image reconstructions and MIPs were obtained to evaluate the vascular anatomy. RADIATION DOSE REDUCTION: This exam was performed according to the departmental dose-optimization program which includes automated exposure control, adjustment of the mA and/or kV according to patient size and/or use of iterative reconstruction technique. CONTRAST:  118m OMNIPAQUE IOHEXOL 350 MG/ML SOLN COMPARISON:  Chest x-ray earlier 08/13/2022. CT angiogram 01/17/22 older exams well. FINDINGS: Cardiovascular: The heart is enlarged. Particularly involving the right atrium with some reflux of contrast into the hepatic veins. Status post TAVR. Normal caliber thoracic aorta otherwise with some scattered mild plaque. Small pericardial effusion. Right IJ chest port. Some enlargement of the pulmonary artery. Please correlate for any evidence of pulmonary arterial  hypertension. There is a focal stricture along the proximal left main pulmonary artery. There is differences in opacification from the contrast injection proximal and distal to the stricture. Appearance suggests a high-grade stenosis. Please correlate for any known history. There are areas of peripheral arterial branches in the left lung which have suboptimal enhancement. Calcifications are seen along the main pulmonary artery origin. With the motion and the contrast enhancement no large, central pulmonary embolism identified. Mediastinum/Nodes: No specific abnormal lymph node enlargement present in the axillary region, hilum. A few small mediastinal nodes are seen, not pathologic by size criteria. Lungs/Pleura: Tiny bilateral pleural effusions are seen, right greater than left. Adjacent parenchymal opacity is identified. There are scattered ground-glass. Areas of interstitial septal thickening. More bandlike areas in the lingula likely again atelectatic or scar. The right effusion is new from previous. The adjacent opacity is also new. Diffuse breathing motion. Upper Abdomen: Please see separate dictation of the abdomen and pelvis CT from the same day. Musculoskeletal: Anasarca. Scattered degenerative changes along the spine. Review of the MIP images confirms the above findings. IMPRESSION: Status post TAVR.  Enlarged heart. There is calcifications along the main pulmonary artery with dilated pulmonary arterial vessels diffusely. There is also what appears to be a high-grade stricture or stenosis involving the origin of the left main pulmonary artery with variant enhancement of the pulmonary artery distal to the stenosis. Please correlate with any known history or further workup is recommended. Development of a small right pleural effusion with adjacent parenchymal opacity. Atelectasis versus infiltrate. Stable tiny left effusion. Areas of interstitial septal thickening. Chest port. Please see separate dictation of  abdomen and pelvis CT. Aortic Atherosclerosis (ICD10-I70.0). Electronically Signed   By: AJill SideM.D.   On: 08/13/2022 17:39   DG Chest Port 1 View  Result Date: 08/13/2022 CLINICAL  DATA:  Shortness of breath. EXAM: PORTABLE CHEST 1 VIEW COMPARISON:  Chest radiograph November 19, 2020 FINDINGS: Port-A-Cath tip projects over the superior vena cava. Monitoring leads overlie the patient. Stable cardiac and mediastinal contours. Elevation left hemidiaphragm. Scattered predominately lower lung heterogeneous opacities. No pleural effusion or pneumothorax. Thoracic spine degenerative changes. IMPRESSION: Scattered lower lung heterogeneous opacities may represent atelectasis or infection. Electronically Signed   By: Lovey Newcomer M.D.   On: 08/13/2022 16:19    Cardiac Studies   TTE 08/14/2022 1. Left ventricular ejection fraction, by estimation, is 65 to 70%. The  left ventricle has normal function. The left ventricle has no regional  wall motion abnormalities. There is moderate concentric left ventricular  hypertrophy. Left ventricular  diastolic parameters are indeterminate. There is the interventricular  septum is flattened in systole, consistent with right ventricular pressure  overload.   2. Right ventricular systolic function is mildly reduced. The right  ventricular size is severely enlarged. There is severely elevated  pulmonary artery systolic pressure. The estimated right ventricular  systolic pressure is 28.7 mmHg.   3. Right atrial size was mild to moderately dilated.   4. The mitral valve is degenerative. Mild mitral valve regurgitation.  Severe mitral stenosis. The mean mitral valve gradient is 10.0 mmHg with  average heart rate of 89 bpm. Severe mitral annular calcification.   5. Tricuspid valve regurgitation is severe.   6. The aortic valve has been repaired/replaced. Aortic valve  regurgitation is trivial, valvular and paravalvular. There is a 26 mm  Sapien prosthetic (TAVR) valve  present in the aortic position. Procedure  Date: 04/16/2016. Aortic valve area, by VTI  measures 3.10 cm. Aortic valve mean gradient measures 5.2 mmHg. Aortic  valve Vmax measures 1.60 m/s.   7. Pulmonic valve regurgitation is moderate.   8. There is Moderate (Grade III) atheroma plaque involving the ascending  aorta.   9. The inferior vena cava is normal in size with <50% respiratory  variability, suggesting right atrial pressure of 8 mmHg.    Patient Profile     Ronald Thompson is a 67 y.o. 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 His TAVR prosthesis is doing well. His RV function is decreased with severe PH ( WHO group II and III). Further he has pulmonary artery stenosis (likely also related to RT). He also has pulmonary fibrosis. 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 He is not a surgical candidate. The good news is, his Svi is ok at 45 cc/m2. His BNP was elevated. He is s/p 40 IV lasix. He diuresed well. He feels better. He is planned for DC today, he is working with palliative. He can switch to torsemide 20 mg daily. Can increase to 40 mg daily  if weight gain > 5 pounds in a week, PND/orthopnea. I recommend ted hose on discharge. We can work on follow up with cardiology for him. He is stable for discharge from a cardiology perspective.  Cardio-Onc: He has had multiple cardiotoxic cancer therapies (eg Folfox/TKI). Had thoracic DVT in 1990. As well as RT induced valve disease per above. LV function is preserved. Has metastatic  neuroendocrine pancreatic cancer and palliative   For questions or updates, please contact [place] Please consult  [email or web address] for contact info under     Time Spent Directly with Patient:  I have spent a total of 35 minutes with the patient reviewing hospital notes, telemetry, EKGs, labs and examining the patient as well as establishing an assessment and plan that was discussed personally with the patient.  > 50% of time was spent in direct patient care.     Signed, Janina Mayo, MD  08/15/2022, 9:24 AM

## 2022-08-15 NOTE — Discharge Summary (Signed)
Physician Discharge Summary  Ronald Thompson OIZ:[id number] DOB: 1955/09/02 DOA: 08/13/2022  PCP: Ronald Borg, MD  Admit date: 08/13/2022 Discharge date: 08/15/2022  Time spent: 40 minutes  Recommendations for Outpatient Follow-up:  Follow outpatient CBC/CMP  Follow diuresis/volume status - adjust outpatient as needed Follow pulm artery stenosis outpatient, follow pulm hypertension outpatient  Follow with cardiology outpatient    Discharge Diagnoses:  Principal Problem:   Acute on chronic diastolic CHF (congestive heart failure) ([place]) Active Problems:   S/P TAVR (transcatheter aortic valve replacement)   Hyperlipidemia   Primary neuroendocrine tumor of pancreas   Pulmonary fibrosis ([place])   HTN (hypertension)   Pulmonary artery stenosis   Acute respiratory failure with hypoxia ([facility])   Elevated troponin   Pulmonary hypertension ([place])   Discharge Condition: stable  Diet recommendation: heart healthy  Filed Weights   08/14/22 2348 08/15/22 0115 08/15/22 0417  Weight: 70.9 kg 70.9 kg 70.9 kg    History of present illness:  Ronald Thompson is Ronald Thompson 67 y.o. male with medical history significant of neuroendocrine tumor with metastases to the liver, asthma, hypertension, hyperlipidemia, BPH, chest seminoma (1990) presenting with worsening SOB.   He's improved with lasix.  CT scan showed stenosis or stricture of L main pulm artery.  IR not planning any intervention.  Stable for discharge today with outpatient oncology and cardiology follow up  Hospital Course:  Assessment and Plan: Acute on chronic diastolic heart failure Pulmonary Hypertension Improved with lasix CXR at presentation with lower lung heterogenous opacities CT PE protocol with high grade stenosis or stricture involving origin of L main pulm artery, interstitial septal thickening Cards c/s, appreciate recs - plan for discharge on torsemide Echo with preserved EF, flatted IV septum in systole, RVSF mildly reduced, severely  elevated PASP, grade III atheroma (could add aspirin if ok with his oncologist, not his mild thrombocytopenia)   Pulmonary Artery Stenosis Thought post radiation, appreciate IR eval, he's high risk for angioplasty and/or stenting No plans for any procedure at this time   Acute Kidney Injury Improved today, follow outpatient   Metastatic Neuroendocrine Tumor Chest Seminoma 1990 treated with BEP and chest radiation at [facility] Cycle 1 [facility]fox 08/07/2022 Oncology added to treatment team Of note, has had chronic exertional dyspnea since treatment for seminoma   S/p TAVR Per cards   Dyslipidemia Continue simvastatin  BPH Flomax     Procedures:  Echo IMPRESSIONS     1. Left ventricular ejection fraction, by estimation, is 65 to 70%. The  left ventricle has normal function. The left ventricle has no regional  wall motion abnormalities. There is moderate concentric left ventricular  hypertrophy. Left ventricular  diastolic parameters are indeterminate. There is the interventricular  septum is flattened in systole, consistent with right ventricular pressure  overload.   2. Right ventricular systolic function is mildly reduced. The right  ventricular size is severely enlarged. There is severely elevated  pulmonary artery systolic pressure. The estimated right ventricular  systolic pressure is 33.8 mmHg.   3. Right atrial size was mild to moderately dilated.   4. The mitral valve is degenerative. Mild mitral valve regurgitation.  Severe mitral stenosis. The mean mitral valve gradient is 10.0 mmHg with  average heart rate of 89 bpm. Severe mitral annular calcification.   5. Tricuspid valve regurgitation is severe.   6. The aortic valve has been repaired/replaced. Aortic valve  regurgitation is trivial, valvular and paravalvular. There is Ronald Thompson 26 mm  Sapien prosthetic (TAVR) valve present in the  aortic position. Procedure  Date: 04/16/2016. Aortic valve area, by VTI  measures 3.10 cm.  Aortic valve mean gradient measures 5.2 mmHg. Aortic  valve Vmax measures 1.60 m/s.   7. Pulmonic valve regurgitation is moderate.   8. There is Moderate (Grade III) atheroma plaque involving the ascending  aorta.   9. The inferior vena cava is normal in size with <50% respiratory  variability, suggesting right atrial pressure of 8 mmHg.   Consultations: Oncology cardiology  Discharge Exam: Vitals:   08/15/22 0417 08/15/22 0915  BP: 112/74 (!) 96/53  Pulse: 90 94  Resp: 18 17  Temp: 98.3 F (36.8 C) (!) 97.4 F (36.3 C)  SpO2: 96% 93%   Feels almost back to his baseline from resp status  General: No acute distress. Cardiovascular: RRR Lungs: unlabored Abdomen: Soft, nontender, nondistended Neurological: Alert and oriented 3. Moves all extremities 4. Cranial nerves II through XII grossly intact. Extremities: trace LE edema  Discharge Instructions   Discharge Instructions     Call MD for:  difficulty breathing, headache or visual disturbances   Complete by: As directed    Call MD for:  extreme fatigue   Complete by: As directed    Call MD for:  hives   Complete by: As directed    Call MD for:  persistant dizziness or light-headedness   Complete by: As directed    Call MD for:  persistant nausea and vomiting   Complete by: As directed    Call MD for:  redness, tenderness, or signs of infection (pain, swelling, redness, odor or green/yellow discharge around incision site)   Complete by: As directed    Call MD for:  severe uncontrolled pain   Complete by: As directed    Call MD for:  temperature >100.4   Complete by: As directed    Diet - low sodium heart healthy   Complete by: As directed    Discharge instructions   Complete by: As directed    You were seen for volume overload or heart failure.  You've improved with diuresis.  We'll discharge you with torsemide 20 mg daily at discharge.  If your weight increases by 5 lbs in [name] week, increase this to 40 mg  daily.  Check your weights daily.  If you gain 3 lbs in [name] day or 5 lbs in [name] week, call your PCP or cardiologist for additional instructions.  Your echo showed good squeeze, but increased pressures on the right side.  You had an atheroma of the ascending aorta.  If it's ok with your oncologist, you can start Iolanda Folson baby aspirin in addition to your simvastatin.   Repeat labs within 1 week with your PCP (or cardiology or oncology) to ensure labs are stable on your new diuretic.  You had an abnormal CT scan with pulmonary artery stenosis.  You were seen by interventional radiology who did not recommend any procedure at this time.   Return for new, recurrent, or worsening symptoms.  Please ask your PCP to request records from this hospitalization so they know what was done and what the next steps will be.   Increase activity slowly   Complete by: As directed       Allergies as of 08/15/2022       Reactions   Penicillins Hives   ENTIRE BODY Has patient had Lipa Knauff PCN reaction causing immediate rash, facial/tongue/throat swelling, SOB or lightheadedness with hypotension: No Has patient had Prestyn Stanco PCN reaction causing severe rash involving mucus  membranes or skin necrosis: No Has patient had Thane Age PCN reaction that required hospitalization * *  YES  * * Has patient had Takeesha Isley PCN reaction occurring within the last 10 years: No If all of the above answers are "NO", then may proceed with Cephalosporin use. *reaction occurred when he was 17        Medication List     STOP taking these medications    clindamycin 300 MG capsule Commonly known as: CLEOCIN       TAKE these medications    acetaminophen 500 MG tablet Commonly known as: TYLENOL Take 2 tablets (1,000 mg total) by mouth every 8 (eight) hours as needed for headache. What changed: when to take this   albuterol 108 (90 Base) MCG/ACT inhaler Commonly known as: VENTOLIN HFA Inhale 1-2 puffs into the lungs every 6 (six) hours as needed for wheezing  or shortness of breath.   diphenhydrAMINE 25 MG tablet Commonly known as: BENADRYL Take 25-50 mg by mouth at bedtime.   Lancet Device Misc Use as instructed to test blood sugar daily E11.65   lidocaine-prilocaine cream Commonly known as: EMLA Apply to portacath site 1 hour prior to use   loperamide 2 MG capsule Commonly known as: IMODIUM Take 2 mg by mouth as needed for diarrhea or loose stools.   losartan 100 MG tablet Commonly known as: COZAAR Take 1 tablet (100 mg total) by mouth daily.   octreotide 30 MG injection Commonly known as: SANDOSTATIN LAR Inject 30 mg into the muscle every 28 (twenty-eight) days.   OneTouch Delica Plus [id number] Misc TEST ONCE DAILY   oxymetazoline 0.05 % nasal spray Commonly known as: AFRIN Place 2 sprays into both nostrils at bedtime as needed for congestion.   prochlorperazine 10 MG tablet Commonly known as: COMPAZINE Take 1 tablet (10 mg total) by mouth every 6 (six) hours as needed for nausea or vomiting.   simvastatin 20 MG tablet Commonly known as: ZOCOR TAKE ONE TABLET BY MOUTH DAILY AT 8PM What changed:  how much to take how to take this when to take this   tamsulosin 0.4 MG Caps capsule Commonly known as: FLOMAX Take 1 capsule (0.4 mg total) by mouth daily.   torsemide 20 MG tablet Commonly known as: DEMADEX Take 1 tablet (20 mg total) by mouth daily.   VICKS VAPOR INHALER IN Inhale 1 spray into the lungs daily as needed (congestion).   Vitamin D-3 125 MCG (5000 UT) Tabs Take 1 tablet by mouth daily in the afternoon.        Allergies  Allergen Reactions   Penicillins Hives    ENTIRE BODY Has patient had Cullen Lahaie PCN reaction causing immediate rash, facial/tongue/throat swelling, SOB or lightheadedness with hypotension: No Has patient had Makaia Rappa PCN reaction causing severe rash involving mucus membranes or skin necrosis: No Has patient had Lurine Imel PCN reaction that required hospitalization * *  YES  * * Has patient had Dayona Shaheen PCN  reaction occurring within the last 10 years: No If all of the above answers are "NO", then may proceed with Cephalosporin use. *reaction occurred when he was 17      The results of significant diagnostics from this hospitalization (including imaging, microbiology, ancillary and laboratory) are listed below for reference.    Significant Diagnostic Studies: ECHOCARDIOGRAM COMPLETE  Result Date: 08/14/2022    ECHOCARDIOGRAM REPORT   Patient Name:   Ronald Thompson Date of Exam: 08/14/2022 Medical Rec #:  [id number]   Height:  70.0 in Accession #:    [id number]  Weight:       161.2 lb Date of Birth:  28-Nov-1955   BSA:          1.905 m Patient Age:    4 years    BP:           134/74 mmHg Patient Gender: M           HR:           89 bpm. Exam Location:  Inpatient Procedure: 2D Echo, Cardiac Doppler and Color Doppler Indications:    Pulmonic valve disease I37.9  History:        Patient has prior history of Echocardiogram examinations, most                 recent 12/18/2021. Pulmonary fibrosis, Aortic Valve Disease and                 Mitral Valve Disease, Signs/Symptoms:Murmur; Risk                 Factors:Hypertension and Dyslipidemia. Cancer.                 Aortic Valve: 26 mm Sapien prosthetic, stented (TAVR) valve is                 present in the aortic position. Procedure Date: 04/16/2016.  Sonographer:    Ronald Thompson Referring Phys: Ronald Thompson  1. Left ventricular ejection fraction, by estimation, is 65 to 70%. The left ventricle has normal function. The left ventricle has no regional wall motion abnormalities. There is moderate concentric left ventricular hypertrophy. Left ventricular diastolic parameters are indeterminate. There is the interventricular septum is flattened in systole, consistent with right ventricular pressure overload.  2. Right ventricular systolic function is mildly reduced. The right ventricular size is severely enlarged. There is severely elevated  pulmonary artery systolic pressure. The estimated right ventricular systolic pressure is 81.1 mmHg.  3. Right atrial size was mild to moderately dilated.  4. The mitral valve is degenerative. Mild mitral valve regurgitation. Severe mitral stenosis. The mean mitral valve gradient is 10.0 mmHg with average heart rate of 89 bpm. Severe mitral annular calcification.  5. Tricuspid valve regurgitation is severe.  6. The aortic valve has been repaired/replaced. Aortic valve regurgitation is trivial, valvular and paravalvular. There is Ronald Thompson 26 mm Sapien prosthetic (TAVR) valve present in the aortic position. Procedure Date: 04/16/2016. Aortic valve area, by VTI measures 3.10 cm. Aortic valve mean gradient measures 5.2 mmHg. Aortic valve Vmax measures 1.60 m/s.  7. Pulmonic valve regurgitation is moderate.  8. There is Moderate (Grade III) atheroma plaque involving the ascending aorta.  9. The inferior vena cava is normal in size with <50% respiratory variability, suggesting right atrial pressure of 8 mmHg. FINDINGS  Left Ventricle: Left ventricular ejection fraction, by estimation, is 65 to 70%. The left ventricle has normal function. The left ventricle has no regional wall motion abnormalities. The left ventricular internal cavity size was small. There is moderate  concentric left ventricular hypertrophy. The interventricular septum is flattened in systole, consistent with right ventricular pressure overload. Left ventricular diastolic parameters are indeterminate. Right Ventricle: The right ventricular size is severely enlarged. No increase in right ventricular wall thickness. Right ventricular systolic function is mildly reduced. There is severely elevated pulmonary artery systolic pressure. The tricuspid regurgitant velocity is 4.14 m/s, and with an assumed right atrial pressure of 8 mmHg, the estimated  right ventricular systolic pressure is 00.1 mmHg. Left Atrium: Left atrial size was normal in size. Right Atrium: Right  atrial size was mild to moderately dilated. Pericardium: There is no evidence of pericardial effusion. Mitral Valve: The mitral valve is degenerative in appearance. There is severe thickening of the mitral valve leaflet(s). There is severe calcification of the mitral valve leaflet(s). Severe mitral annular calcification. Mild mitral valve regurgitation. Severe mitral valve stenosis. MV peak gradient, 19.2 mmHg. The mean mitral valve gradient is 10.0 mmHg with average heart rate of 89 bpm. Tricuspid Valve: The tricuspid valve is grossly normal. Tricuspid valve regurgitation is severe. The flow in the hepatic veins is reversed during ventricular systole. Aortic Valve: The aortic valve has been repaired/replaced. Aortic valve regurgitation is trivial. Aortic valve mean gradient measures 5.2 mmHg. Aortic valve peak gradient measures 10.2 mmHg. Aortic valve area, by VTI measures 3.10 cm. There is Ronald Thompson 26 mm Sapien prosthetic, stented (TAVR) valve present in the aortic position. Procedure Date: 04/16/2016. Pulmonic Valve: The pulmonic valve was grossly normal. Pulmonic valve regurgitation is moderate. No evidence of pulmonic stenosis. Aorta: The aortic root and ascending aorta are structurally normal, with no evidence of dilitation. There is moderate (Grade III) atheroma plaque involving the ascending aorta. Venous: The inferior vena cava is normal in size with less than 50% respiratory variability, suggesting right atrial pressure of 8 mmHg. IAS/Shunts: No atrial level shunt detected by color flow Doppler.  LEFT VENTRICLE PLAX 2D LVIDd:         3.30 cm   Diastology LVIDs:         2.30 cm   LV e' medial:    5.33 cm/s LV PW:         1.30 cm   LV E/e' medial:  33.2 LV IVS:        1.50 cm   LV e' lateral:   6.96 cm/s LVOT diam:     2.50 cm   LV E/e' lateral: 25.4 LV SV:         86 LV SV Index:   45 LVOT Area:     4.91 cm  RIGHT VENTRICLE RV Basal diam:  5.10 cm RV Mid diam:    3.90 cm RV S prime:     10.90 cm/s TAPSE  (M-mode): 1.8 cm LEFT ATRIUM           Index        RIGHT ATRIUM           Index LA diam:      3.80 cm 2.00 cm/m   RA Area:     20.00 cm LA Vol (A4C): 59.6 ml 31.29 ml/m  RA Volume:   57.40 ml  30.14 ml/m  AORTIC VALVE                     PULMONIC VALVE AV Area (Vmax):    3.11 cm      PR End Diast Vel: 2.16 msec AV Area (Vmean):   3.06 cm AV Area (VTI):     3.10 cm AV Vmax:           159.64 cm/s AV Vmean:          105.119 cm/s AV VTI:            0.278 m AV Peak Grad:      10.2 mmHg AV Mean Grad:      5.2 mmHg LVOT Vmax:         101.00  cm/s LVOT Vmean:        65.600 cm/s LVOT VTI:          0.176 m LVOT/AV VTI ratio: 0.63  AORTA Ao Root diam: 2.80 cm Ao Asc diam:  3.20 cm MITRAL VALVE                TRICUSPID VALVE MV Area (PHT): 2.91 cm     TR Peak grad:   68.6 mmHg MV Area VTI:   1.57 cm     TR Vmax:        414.00 cm/s MV Peak grad:  19.2 mmHg MV Mean grad:  10.0 mmHg    SHUNTS MV Vmax:       2.19 m/s     Systemic VTI:  0.18 m MV Vmean:      147.0 cm/s   Systemic Diam: 2.50 cm MV Decel Time: 261 msec MV E velocity: 177.00 cm/s MV Chesney Suares velocity: 209.00 cm/s MV E/Antoneo Ghrist ratio:  0.85 Ronald Kaiser MD Electronically signed by Ronald Kaiser MD Signature Date/Time: 08/14/2022/11:07:56 AM    Final    CT ABDOMEN PELVIS W CONTRAST  Result Date: 08/13/2022 CLINICAL DATA:  History of liver Mets and cancer EXAM: CT ABDOMEN AND PELVIS WITH CONTRAST TECHNIQUE: Multidetector CT imaging of the abdomen and pelvis was performed using the standard protocol following bolus administration of intravenous contrast. RADIATION DOSE REDUCTION: This exam was performed according to the departmental dose-optimization program which includes automated exposure control, adjustment of the mA and/or kV according to patient size and/or use of iterative reconstruction technique. CONTRAST:  12m OMNIPAQUE IOHEXOL 350 MG/ML SOLN COMPARISON:  PET CT 07/15/2022, 03/28/2022, CT 02/29/2016 FINDINGS: Lower chest: Lung bases demonstrate small  bilateral pleural effusions. Partial atelectasis or pneumonia in the right lower lobe clear cardiomegaly with aortic valve prosthesis. Similar elevation left diaphragm. Hepatobiliary: Markedly enlarged liver. Numerous hepatic metastatic lesions. Left hepatic lobe mass measures 10.1 x 10.2 cm, previously 10.1 x 10.2 cm. Dominant right hepatic mass measures 15.1 x 12.3 cm, previous 15.1 cm maximum. Inferior left hepatic mass measuring 11.2 cm compared with 11.2 cm previously. Numerous additional large hepatic masses. Pancreas: Displaced to the left upper quadrant by hepatic masses. No acute inflammatory process. Spleen: Normal in size without focal abnormality. Adrenals/Urinary Tract: Adrenal glands are within normal limits. The kidneys show no hydronephrosis. The bladder is unremarkable Stomach/Bowel: The stomach is nonenlarged. No dilated small bowel. Bowel is displaced to the left upper quadrant by enlarged liver. No acute bowel wall thickening. Vascular/Lymphatic: Moderate aortic atherosclerosis. No aneurysm. No suspicious lymph nodes. Mass effect on hepatic vessels by multiple large liver masses. Reproductive: Prostate is slightly enlarged Other: No free air.  No significant pelvic effusion. Musculoskeletal: No acute osseous abnormality. Small foci of sclerosis within the left iliac bone without significant change IMPRESSION: 1. Markedly enlarged liver with numerous large hepatic metastatic lesions without gross interval change in size as compared with PET CT from December. 2. Small bilateral pleural effusions with partial atelectasis or pneumonia in the right lower lobe. Cardiomegaly. 3. Aortic atherosclerosis. Aortic Atherosclerosis (ICD10-I70.0). Electronically Signed   By: Ronald FoilM.D.   On: 08/13/2022 17:52   CT Angio Chest PE W and/or Wo Contrast  Result Date: 08/13/2022 CLINICAL DATA:  Shortness of breath for several days. Hypoxic. No neoplasm with liver metastases. Neuroendocrine tumor. On  chemotherapy. EXAM: CT ANGIOGRAPHY CHEST WITH CONTRAST TECHNIQUE: Multidetector CT imaging of the chest was performed using the standard protocol during bolus administration of intravenous contrast.  Multiplanar CT image reconstructions and MIPs were obtained to evaluate the vascular anatomy. RADIATION DOSE REDUCTION: This exam was performed according to the departmental dose-optimization program which includes automated exposure control, adjustment of the mA and/or kV according to patient size and/or use of iterative reconstruction technique. CONTRAST:  154m OMNIPAQUE IOHEXOL 350 MG/ML SOLN COMPARISON:  Chest x-ray earlier 08/13/2022. CT angiogram 01/17/22 older exams well. FINDINGS: Cardiovascular: The heart is enlarged. Particularly involving the right atrium with some reflux of contrast into the hepatic veins. Status post TAVR. Normal caliber thoracic aorta otherwise with some scattered mild plaque. Small pericardial effusion. Right IJ chest port. Some enlargement of the pulmonary artery. Please correlate for any evidence of pulmonary arterial hypertension. There is Jarren Para focal stricture along the proximal left main pulmonary artery. There is differences in opacification from the contrast injection proximal and distal to the stricture. Appearance suggests Ronald Thompson high-grade stenosis. Please correlate for any known history. There are areas of peripheral arterial branches in the left lung which have suboptimal enhancement. Calcifications are seen along the main pulmonary artery origin. With the motion and the contrast enhancement no large, central pulmonary embolism identified. Mediastinum/Nodes: No specific abnormal lymph node enlargement present in the axillary region, hilum. Elye Harmsen few small mediastinal nodes are seen, not pathologic by size criteria. Lungs/Pleura: Tiny bilateral pleural effusions are seen, right greater than left. Adjacent parenchymal opacity is identified. There are scattered ground-glass. Areas of  interstitial septal thickening. More bandlike areas in the lingula likely again atelectatic or scar. The right effusion is new from previous. The adjacent opacity is also new. Diffuse breathing motion. Upper Abdomen: Please see separate dictation of the abdomen and pelvis CT from the same day. Musculoskeletal: Anasarca. Scattered degenerative changes along the spine. Review of the MIP images confirms the above findings. IMPRESSION: Status post TAVR.  Enlarged heart. There is calcifications along the main pulmonary artery with dilated pulmonary arterial vessels diffusely. There is also what appears to be Karista Aispuro high-grade stricture or stenosis involving the origin of the left main pulmonary artery with variant enhancement of the pulmonary artery distal to the stenosis. Please correlate with any known history or further workup is recommended. Development of Vic Esco small right pleural effusion with adjacent parenchymal opacity. Atelectasis versus infiltrate. Stable tiny left effusion. Areas of interstitial septal thickening. Chest port. Please see separate dictation of abdomen and pelvis CT. Aortic Atherosclerosis (ICD10-I70.0). Electronically Signed   By: Ronald SideM.D.   On: 08/13/2022 17:39   DG Chest Port 1 View  Result Date: 08/13/2022 CLINICAL DATA:  Shortness of breath. EXAM: PORTABLE CHEST 1 VIEW COMPARISON:  Chest radiograph November 19, 2020 FINDINGS: [place] Prest[place]-Cath tip projects over the superior vena cava. Monitoring leads overlie the patient. Stable cardiac and mediastinal contours. Elevation left hemidiaphragm. Scattered predominately lower lung heterogeneous opacities. No pleural effusion or pneumothorax. Thoracic spine degenerative changes. IMPRESSION: Scattered lower lung heterogeneous opacities may represent atelectasis or infection. Electronically Signed   By: Ronald NewcomerM.D.   On: 08/13/2022 16:19   IR IMAGING GUIDED PORT INSERTION  Result Date: 08/02/2022 CLINICAL DATA:  Metastatic neuroendocrine tumor,  access for chemotherapy EXAM: RIGHT INTERNAL JUGULAR SINGLE LUMEN POWER PORT CATHETER INSERTION Date:  08/02/2022 08/02/2022 10:15 am Radiologist:  M. TDaryll Brod MD Guidance:  Ultrasound and fluoroscopic MEDICATIONS: 1% lidocaine local with epinephrine ANESTHESIA/SEDATION: Versed 1.5 mg IV; Fentanyl 75 mcg IV; Moderate Sedation Time:  21 minute The patient was continuously monitored during the procedure by the interventional radiology nurse under my direct supervision.  FLUOROSCOPY: 0 minutes, 42 seconds (3 mGy) COMPLICATIONS: None immediate. CONTRAST:  None. PROCEDURE: Informed consent was obtained from the patient following explanation of the procedure, risks, benefits and alternatives. The patient understands, agrees and consents for the procedure. All questions were addressed. Kabrina Christiano time out was performed. Maximal barrier sterile technique utilized including caps, mask, sterile gowns, sterile gloves, large sterile drape, hand hygiene, and 2% chlorhexidine scrub. Under sterile conditions and local anesthesia, right internal jugular micropuncture venous access was performed. Access was performed with ultrasound. Images were obtained for documentation of the patent right internal jugular vein. Zavior Thomason guide wire was inserted followed by Shean Gerding transitional dilator. This allowed insertion of Karman Biswell guide wire and catheter into the IVC. Measurements were obtained from the SVC / RA junction back to the right IJ venotomy site. In the right infraclavicular chest, Haadi Santellan subcutaneous pocket was created over the second anterior rib. This was done under sterile conditions and local anesthesia. 1% lidocaine with epinephrine was utilized for this. Quang Thorpe 2.5 cm incision was made in the skin. Blunt dissection was performed to create Emary Zalar subcutaneous pocket over the right pectoralis major muscle. The pocket was flushed with saline vigorously. There was adequate hemostasis. The port catheter was assembled and checked for leakage. The port catheter was  secured in the pocket with two retention sutures. The tubing was tunneled subcutaneously to the right venotomy site and inserted into the SVC/RA junction through Daven Pinckney valved peel-away sheath. Position was confirmed with fluoroscopy. Images were obtained for documentation. The patient tolerated the procedure well. No immediate complications. Incisions were closed in Laban Orourke two layer fashion with 4 - 0 Vicryl suture. Dermabond was applied to the skin. The port catheter was accessed, blood was aspirated followed by saline and heparin flushes. Needle was removed. Donis Pinder dry sterile dressing was applied. IMPRESSION: Ultrasound and fluoroscopically guided right internal jugular single lumen power port catheter insertion. Tip in the SVC/RA junction. Catheter ready for use. Electronically Signed   By: Ronald Mages.  Thompson RonaldD.   On: 08/02/2022 10:49    Microbiology: Recent Results (from the past 240 hour(s))  Resp panel by RT-PCR (RSV, Flu Donn Zanetti&B, Covid) Anterior Nasal Swab     Status: None   Collection Time: 08/13/22  4:00 PM   Specimen: Anterior Nasal Swab  Result Value Ref Range Status   SARS Coronavirus 2 by RT PCR NEGATIVE NEGATIVE Final    Comment: (NOTE) SARS-CoV-2 target nucleic acids are NOT DETECTED.  The SARS-CoV-2 RNA is generally detectable in upper respiratory specimens during the acute phase of infection. The lowest concentration of SARS-CoV-2 viral copies this assay can detect is 138 copies/mL. Jahari Wiginton negative result does not preclude SARS-Cov-2 infection and should not be used as the sole basis for treatment or other patient management decisions. Danni Leabo negative result may occur with  improper specimen collection/handling, submission of specimen other than nasopharyngeal swab, presence of viral mutation(s) within the areas targeted by this assay, and inadequate number of viral copies(<138 copies/mL). Trenesha Alcaide negative result must be combined with clinical observations, patient history, and epidemiological information. The  expected result is Negative.  Fact Sheet for Patients:  EntrepreneurPulse.com.au  Fact Sheet for Healthcare Providers:  IncredibleEmployment.be  This test is no t yet approved or cleared by the [place] FDA and  has been authorized for detection and/or diagnosis of SARS-CoV-2 by FDA under an Emergency Use Authorization (EUA). This EUA will remain  in effect (meaning this test can be used) for the duration of the COVID-19 declaration under Section  564(b)(1) of the Act, 21 U.S.C.section 360bbb-3(b)(1), unless the authorization is terminated  or revoked sooner.       Influenza Lydia Toren by PCR NEGATIVE NEGATIVE Final   Influenza B by PCR NEGATIVE NEGATIVE Final    Comment: (NOTE) The Xpert Xpress SARS-CoV-2/FLU/RSV plus assay is intended as an aid in the diagnosis of influenza from Nasopharyngeal swab specimens and should not be used as Tryphena Perkovich sole basis for treatment. Nasal washings and aspirates are unacceptable for Xpert Xpress SARS-CoV-2/FLU/RSV testing.  Fact Sheet for Patients: EntrepreneurPulse.com.au  Fact Sheet for Healthcare Providers: IncredibleEmployment.be  This test is not yet approved or cleared by the [place] FDA and has been authorized for detection and/or diagnosis of SARS-CoV-2 by FDA under an Emergency Use Authorization (EUA). This EUA will remain in effect (meaning this test can be used) for the duration of the COVID-19 declaration under Section 564(b)(1) of the Act, 21 U.S.C. section 360bbb-3(b)(1), unless the authorization is terminated or revoked.     Resp Syncytial Virus by PCR NEGATIVE NEGATIVE Final    Comment: (NOTE) Fact Sheet for Patients: EntrepreneurPulse.com.au  Fact Sheet for Healthcare Providers: IncredibleEmployment.be  This test is not yet approved or cleared by the [place] FDA and has been authorized for detection and/or  diagnosis of SARS-CoV-2 by FDA under an Emergency Use Authorization (EUA). This EUA will remain in effect (meaning this test can be used) for the duration of the COVID-19 declaration under Section 564(b)(1) of the Act, 21 U.S.C. section 360bbb-3(b)(1), unless the authorization is terminated or revoked.  Performed at [facility], [address], [place], [place] [address]      Labs: Basic Metabolic Panel: Recent Labs  Lab 08/13/22 1546 08/13/22 2315 08/14/22 1610 08/15/22 0130 08/15/22 0737  NA 140 138 139  --  137  K 4.3 4.1 4.4  --  3.9  CL 101 96* 96*  --  95*  CO2 31 32 33*  --  30  GLUCOSE 131* 110* 135*  --  99  BUN '21 17 22  '$ --  24*  CREATININE 0.85 1.09 1.43*  --  1.24  CALCIUM 9.0 9.1 9.2  --  8.9  MG  --  1.8  --  2.0  --   PHOS  --  3.5  --  5.0*  --    Liver Function Tests: Recent Labs  Lab 08/13/22 1546 08/13/22 2315  AST 29 37  ALT 20 24  ALKPHOS 128* 145*  BILITOT 1.5* 1.8*  PROT 6.2* 6.3*  ALBUMIN 3.6 3.3*   Recent Labs  Lab 08/13/22 1546  LIPASE 38   No results for input(s): "AMMONIA" in the last 168 hours. CBC: Recent Labs  Lab 08/13/22 1546 08/13/22 2315  WBC 5.5 5.2  NEUTROABS  --  4.1  HGB 9.8* 10.2*  HCT 30.1* 30.0*  MCV 104.2* 102.0*  PLT 120* 121*   Cardiac Enzymes: No results for input(s): "CKTOTAL", "CKMB", "CKMBINDEX", "TROPONINI" in the last 168 hours. BNP: BNP (last 3 results) Recent Labs    08/13/22 1546  BNP 1,164.3*    ProBNP (last 3 results) No results for input(s): "PROBNP" in the last 8760 hours.  CBG: Recent Labs  Lab 08/14/22 0434 08/14/22 0813 08/14/22 1212 08/14/22 1601  GLUCAP 110* 99 88 141*       Signed:  Fayrene Helper MD.  [facility] 08/15/2022, 12:20 PM

## 2022-08-15 NOTE — Progress Notes (Signed)
Mobility Specialist - Progress Note   08/15/22 1030  Mobility  Activity Ambulated with assistance in hallway  Level of Assistance Standby assist, set-up cues, supervision of patient - no hands on  Assistive Device None  Distance Ambulated (ft) 240 ft  Activity Response Tolerated well  Mobility Referral Yes  $Mobility charge 1 Mobility   Pt was received in bed and agreeable to mobility. Pt c/o slight weaknnes during ambulation d/t cancer. Pt was returned to EOB with all needs met.   Franki Monte  Mobility Specialist Please contact via Solicitor or Rehab office at [phone]

## 2022-08-15 NOTE — Progress Notes (Signed)
IP PROGRESS NOTE  Subjective:   Ronald Thompson is well-known to me with a history of metastatic neuroendocrine carcinoma.  He has a remote history of a chest seminoma.  He completed cycle 1 FOLFOX 08/07/2022 for treatment of progressive neuroendocrine carcinoma involving the liver.  He reports tolerating the treatment well.  No nausea/vomiting, mouth sores, diarrhea, or neuropathy symptoms. He presented to the emergency room 08/13/2022 with dyspnea and orthopnea.  He noted leg swelling.  CT of the chest revealed a small pericardial effusion and a focal stricture at the proximal left main pulmonary artery.  Tiny bilateral pleural effusions with adjacent opacity.  CT abdomen/pelvis revealed hepatomegaly with numerous metastases. The BNP was elevated  Interventional radiology was consulted and did not recommend an intervention for treatment of the pulmonary artery stenosis.  He was treated with diuresis and reports improvement in the dyspnea. Objective: Vital signs in last 24 hours: Blood pressure (!) 96/53, pulse 94, temperature (!) 97.4 F (36.3 C), temperature source Oral, resp. rate 17, height '5\' 10"'$  (1.778 m), weight 156 lb 4.9 oz (70.9 kg), SpO2 93 %.  Intake/Output from previous day: 01/10 0701 - 01/11 0700 In: 240 [P.O.:240] Out: 3150 [Urine:3150]  Physical Exam:  HEENT: No thrush or ulcers Lungs: Decreased breath sounds at the left lower chest, no respiratory distress Cardiac: Regular rate and rhythm, 2/6 diastolic murmur Abdomen: The liver is palpable throughout the upper abdomen extending across the midline Extremities: Trace pitting edema at the feet bilaterall  Portacath/PICC-without erythema  Lab Results: Recent Labs    08/13/22 1546 08/13/22 2315  WBC 5.5 5.2  HGB 9.8* 10.2*  HCT 30.1* 30.0*  PLT 120* 121*    BMET Recent Labs    08/14/22 1610 08/15/22 0737  NA 139 137  K 4.4 3.9  CL 96* 95*  CO2 33* 30  GLUCOSE 135* 99  BUN 22 24*  CREATININE 1.43* 1.24  CALCIUM  9.2 8.9    No results found for: "CEA1", "CEA", "CAN199", "CA125"  Studies/Results: ECHOCARDIOGRAM COMPLETE  Result Date: 08/14/2022    ECHOCARDIOGRAM REPORT   Patient Name:   Ronald Thompson Date of Exam: 08/14/2022 Medical Rec #:  [id number]   Height:       70.0 in Accession #:    [id number]  Weight:       161.2 lb Date of Birth:  03-30-1956   BSA:          1.905 m Patient Age:    67 years    BP:           134/74 mmHg Patient Gender: M           HR:           89 bpm. Exam Location:  Inpatient Procedure: 2D Echo, Cardiac Doppler and Color Doppler Indications:    Pulmonic valve disease I37.9  History:        Patient has prior history of Echocardiogram examinations, most                 recent 12/18/2021. Pulmonary fibrosis, Aortic Valve Disease and                 Mitral Valve Disease, Signs/Symptoms:Murmur; Risk                 Factors:Hypertension and Dyslipidemia. Cancer.                 Aortic Valve: 26 mm Sapien prosthetic, stented (TAVR) valve is  present in the aortic position. Procedure Date: 04/16/2016.  Sonographer:    Darlina Sicilian RDCS Referring Phys: [place]  1. Left ventricular ejection fraction, by estimation, is 65 to 70%. The left ventricle has normal function. The left ventricle has no regional wall motion abnormalities. There is moderate concentric left ventricular hypertrophy. Left ventricular diastolic parameters are indeterminate. There is the interventricular septum is flattened in systole, consistent with right ventricular pressure overload.  2. Right ventricular systolic function is mildly reduced. The right ventricular size is severely enlarged. There is severely elevated pulmonary artery systolic pressure. The estimated right ventricular systolic pressure is 93.9 mmHg.  3. Right atrial size was mild to moderately dilated.  4. The mitral valve is degenerative. Mild mitral valve regurgitation. Severe mitral stenosis. The mean mitral valve gradient  is 10.0 mmHg with average heart rate of 89 bpm. Severe mitral annular calcification.  5. Tricuspid valve regurgitation is severe.  6. The aortic valve has been repaired/replaced. Aortic valve regurgitation is trivial, valvular and paravalvular. There is a 26 mm Sapien prosthetic (TAVR) valve present in the aortic position. Procedure Date: 04/16/2016. Aortic valve area, by VTI measures 3.10 cm. Aortic valve mean gradient measures 5.2 mmHg. Aortic valve Vmax measures 1.60 m/s.  7. Pulmonic valve regurgitation is moderate.  8. There is Moderate (Grade III) atheroma plaque involving the ascending aorta.  9. The inferior vena cava is normal in size with <50% respiratory variability, suggesting right atrial pressure of 8 mmHg. FINDINGS  Left Ventricle: Left ventricular ejection fraction, by estimation, is 65 to 70%. The left ventricle has normal function. The left ventricle has no regional wall motion abnormalities. The left ventricular internal cavity size was small. There is moderate  concentric left ventricular hypertrophy. The interventricular septum is flattened in systole, consistent with right ventricular pressure overload. Left ventricular diastolic parameters are indeterminate. Right Ventricle: The right ventricular size is severely enlarged. No increase in right ventricular wall thickness. Right ventricular systolic function is mildly reduced. There is severely elevated pulmonary artery systolic pressure. The tricuspid regurgitant velocity is 4.14 m/s, and with an assumed right atrial pressure of 8 mmHg, the estimated right ventricular systolic pressure is 03.0 mmHg. Left Atrium: Left atrial size was normal in size. Right Atrium: Right atrial size was mild to moderately dilated. Pericardium: There is no evidence of pericardial effusion. Mitral Valve: The mitral valve is degenerative in appearance. There is severe thickening of the mitral valve leaflet(s). There is severe calcification of the mitral valve  leaflet(s). Severe mitral annular calcification. Mild mitral valve regurgitation. Severe mitral valve stenosis. MV peak gradient, 19.2 mmHg. The mean mitral valve gradient is 10.0 mmHg with average heart rate of 89 bpm. Tricuspid Valve: The tricuspid valve is grossly normal. Tricuspid valve regurgitation is severe. The flow in the hepatic veins is reversed during ventricular systole. Aortic Valve: The aortic valve has been repaired/replaced. Aortic valve regurgitation is trivial. Aortic valve mean gradient measures 5.2 mmHg. Aortic valve peak gradient measures 10.2 mmHg. Aortic valve area, by VTI measures 3.10 cm. There is a 26 mm Sapien prosthetic, stented (TAVR) valve present in the aortic position. Procedure Date: 04/16/2016. Pulmonic Valve: The pulmonic valve was grossly normal. Pulmonic valve regurgitation is moderate. No evidence of pulmonic stenosis. Aorta: The aortic root and ascending aorta are structurally normal, with no evidence of dilitation. There is moderate (Grade III) atheroma plaque involving the ascending aorta. Venous: The inferior vena cava is normal in size with less than 50% respiratory variability,  suggesting right atrial pressure of 8 mmHg. IAS/Shunts: No atrial level shunt detected by color flow Doppler.  LEFT VENTRICLE PLAX 2D LVIDd:         3.30 cm   Diastology LVIDs:         2.30 cm   LV e' medial:    5.33 cm/s LV PW:         1.30 cm   LV E/e' medial:  33.2 LV IVS:        1.50 cm   LV e' lateral:   6.96 cm/s LVOT diam:     2.50 cm   LV E/e' lateral: 25.4 LV SV:         86 LV SV Index:   45 LVOT Area:     4.91 cm  RIGHT VENTRICLE RV Basal diam:  5.10 cm RV Mid diam:    3.90 cm RV S prime:     10.90 cm/s TAPSE (M-mode): 1.8 cm LEFT ATRIUM           Index        RIGHT ATRIUM           Index LA diam:      3.80 cm 2.00 cm/m   RA Area:     20.00 cm LA Vol (A4C): 59.6 ml 31.29 ml/m  RA Volume:   57.40 ml  30.14 ml/m  AORTIC VALVE                     PULMONIC VALVE AV Area (Vmax):    3.11  cm      PR End Diast Vel: 2.16 msec AV Area (Vmean):   3.06 cm AV Area (VTI):     3.10 cm AV Vmax:           159.64 cm/s AV Vmean:          105.119 cm/s AV VTI:            0.278 m AV Peak Grad:      10.2 mmHg AV Mean Grad:      5.2 mmHg LVOT Vmax:         101.00 cm/s LVOT Vmean:        65.600 cm/s LVOT VTI:          0.176 m LVOT/AV VTI ratio: 0.63  AORTA Ao Root diam: 2.80 cm Ao Asc diam:  3.20 cm MITRAL VALVE                TRICUSPID VALVE MV Area (PHT): 2.91 cm     TR Peak grad:   68.6 mmHg MV Area VTI:   1.57 cm     TR Vmax:        414.00 cm/s MV Peak grad:  19.2 mmHg MV Mean grad:  10.0 mmHg    SHUNTS MV Vmax:       2.19 m/s     Systemic VTI:  0.18 m MV Vmean:      147.0 cm/s   Systemic Diam: 2.50 cm MV Decel Time: 261 msec MV E velocity: 177.00 cm/s MV A velocity: 209.00 cm/s MV E/A ratio:  0.85 Cherlynn Kaiser MD Electronically signed by Cherlynn Kaiser MD Signature Date/Time: 08/14/2022/11:07:56 AM    Final    CT ABDOMEN PELVIS W CONTRAST  Result Date: 08/13/2022 CLINICAL DATA:  History of liver Mets and cancer EXAM: CT ABDOMEN AND PELVIS WITH CONTRAST TECHNIQUE: Multidetector CT imaging of the abdomen and pelvis was performed using the standard protocol following  bolus administration of intravenous contrast. RADIATION DOSE REDUCTION: This exam was performed according to the departmental dose-optimization program which includes automated exposure control, adjustment of the mA and/or kV according to patient size and/or use of iterative reconstruction technique. CONTRAST:  122m OMNIPAQUE IOHEXOL 350 MG/ML SOLN COMPARISON:  PET CT 07/15/2022, 03/28/2022, CT 02/29/2016 FINDINGS: Lower chest: Lung bases demonstrate small bilateral pleural effusions. Partial atelectasis or pneumonia in the right lower lobe clear cardiomegaly with aortic valve prosthesis. Similar elevation left diaphragm. Hepatobiliary: Markedly enlarged liver. Numerous hepatic metastatic lesions. Left hepatic lobe mass measures 10.1 x 10.2  cm, previously 10.1 x 10.2 cm. Dominant right hepatic mass measures 15.1 x 12.3 cm, previous 15.1 cm maximum. Inferior left hepatic mass measuring 11.2 cm compared with 11.2 cm previously. Numerous additional large hepatic masses. Pancreas: Displaced to the left upper quadrant by hepatic masses. No acute inflammatory process. Spleen: Normal in size without focal abnormality. Adrenals/Urinary Tract: Adrenal glands are within normal limits. The kidneys show no hydronephrosis. The bladder is unremarkable Stomach/Bowel: The stomach is nonenlarged. No dilated small bowel. Bowel is displaced to the left upper quadrant by enlarged liver. No acute bowel wall thickening. Vascular/Lymphatic: Moderate aortic atherosclerosis. No aneurysm. No suspicious lymph nodes. Mass effect on hepatic vessels by multiple large liver masses. Reproductive: Prostate is slightly enlarged Other: No free air.  No significant pelvic effusion. Musculoskeletal: No acute osseous abnormality. Small foci of sclerosis within the left iliac bone without significant change IMPRESSION: 1. Markedly enlarged liver with numerous large hepatic metastatic lesions without gross interval change in size as compared with PET CT from December. 2. Small bilateral pleural effusions with partial atelectasis or pneumonia in the right lower lobe. Cardiomegaly. 3. Aortic atherosclerosis. Aortic Atherosclerosis (ICD10-I70.0). Electronically Signed   By: KDonavan FoilM.D.   On: 08/13/2022 17:52   CT Angio Chest PE W and/or Wo Contrast  Result Date: 08/13/2022 CLINICAL DATA:  Shortness of breath for several days. Hypoxic. No neoplasm with liver metastases. Neuroendocrine tumor. On chemotherapy. EXAM: CT ANGIOGRAPHY CHEST WITH CONTRAST TECHNIQUE: Multidetector CT imaging of the chest was performed using the standard protocol during bolus administration of intravenous contrast. Multiplanar CT image reconstructions and MIPs were obtained to evaluate the vascular anatomy.  RADIATION DOSE REDUCTION: This exam was performed according to the departmental dose-optimization program which includes automated exposure control, adjustment of the mA and/or kV according to patient size and/or use of iterative reconstruction technique. CONTRAST:  1063mOMNIPAQUE IOHEXOL 350 MG/ML SOLN COMPARISON:  Chest x-ray earlier 08/13/2022. CT angiogram 01/17/22 older exams well. FINDINGS: Cardiovascular: The heart is enlarged. Particularly involving the right atrium with some reflux of contrast into the hepatic veins. Status post TAVR. Normal caliber thoracic aorta otherwise with some scattered mild plaque. Small pericardial effusion. Right IJ chest port. Some enlargement of the pulmonary artery. Please correlate for any evidence of pulmonary arterial hypertension. There is a focal stricture along the proximal left main pulmonary artery. There is differences in opacification from the contrast injection proximal and distal to the stricture. Appearance suggests a high-grade stenosis. Please correlate for any known history. There are areas of peripheral arterial branches in the left lung which have suboptimal enhancement. Calcifications are seen along the main pulmonary artery origin. With the motion and the contrast enhancement no large, central pulmonary embolism identified. Mediastinum/Nodes: No specific abnormal lymph node enlargement present in the axillary region, hilum. A few small mediastinal nodes are seen, not pathologic by size criteria. Lungs/Pleura: Tiny bilateral pleural effusions are seen, right  greater than left. Adjacent parenchymal opacity is identified. There are scattered ground-glass. Areas of interstitial septal thickening. More bandlike areas in the lingula likely again atelectatic or scar. The right effusion is new from previous. The adjacent opacity is also new. Diffuse breathing motion. Upper Abdomen: Please see separate dictation of the abdomen and pelvis CT from the same day.  Musculoskeletal: Anasarca. Scattered degenerative changes along the spine. Review of the MIP images confirms the above findings. IMPRESSION: Status post TAVR.  Enlarged heart. There is calcifications along the main pulmonary artery with dilated pulmonary arterial vessels diffusely. There is also what appears to be a high-grade stricture or stenosis involving the origin of the left main pulmonary artery with variant enhancement of the pulmonary artery distal to the stenosis. Please correlate with any known history or further workup is recommended. Development of a small right pleural effusion with adjacent parenchymal opacity. Atelectasis versus infiltrate. Stable tiny left effusion. Areas of interstitial septal thickening. Chest port. Please see separate dictation of abdomen and pelvis CT. Aortic Atherosclerosis (ICD10-I70.0). Electronically Signed   By: Jill Side M.D.   On: 08/13/2022 17:39   DG Chest Port 1 View  Result Date: 08/13/2022 CLINICAL DATA:  Shortness of breath. EXAM: PORTABLE CHEST 1 VIEW COMPARISON:  Chest radiograph November 19, 2020 FINDINGS: Port-A-Cath tip projects over the superior vena cava. Monitoring leads overlie the patient. Stable cardiac and mediastinal contours. Elevation left hemidiaphragm. Scattered predominately lower lung heterogeneous opacities. No pleural effusion or pneumothorax. Thoracic spine degenerative changes. IMPRESSION: Scattered lower lung heterogeneous opacities may represent atelectasis or infection. Electronically Signed   By: Lovey Newcomer M.D.   On: 08/13/2022 16:19    Medications: I have reviewed the patient's current medications.  Assessment/Plan: Pancreatic neuroendocrine tumor, WHO grade 2, pancreatic head mass and small peripancreatic/celiac nodes on a CT 02/04/2014 EUS revealed evidence of multiple liver metastases-status post an FNA biopsy of a left liver lesion 02/10/2014 confirming a neuroendocrine tumor   Octreotide scan 04/01/2014 with multiple foci  of metastatic neuroendocrine tumor in the liver Monthly Sandostatin started 03/16/2014 Restaging CT 07/04/2014 with resolution of a previously noted pancreas head lesion and probable progression of liver metastases Restaging CT 10/31/2014-stable liver lesions, no evidence of a pancreas mass, stable. Restaging CT 02/28/2015-stable hepatic metastases, stable pancreas lesion unchanged Restaging CT 08/30/2015-stable pancreas mass, slight enlargement of hepatic metastases Monthly Sandostatin continued Restaging CTs 01/17/2016 revealed enlargement of liver lesions, no new lesions, enlargement of the pancreas head mass Gallium DOTATATE scan 10/01/2016 confirmed uptake in the liver metastases, pancreas primary, peripancreatic adenopathy, and left iliac bone Cycle 1 Lutathera 03/05/2017 Cycle 2 Lutathera 04/30/2017 Cycle 3 Lutathera 07/02/2017 Cycle 4 Lutathera 08/27/2017 Netspot scan 09/29/2017-decreased radiotracer activity within the pancreas head, peripancreatic lymph node, and solitary skeletal lesion.  Liver lesions have increased in size with a decrease in radiotracer activity Monthly Sandostatin continued Netspot scan 05/20/2018-stable to decreased size of liver lesions, most have decreased SUV, similar uptake in the pancreas lesion, previously noted peripancreatic lymph node has resolved him a mild decrease in uptake associated with a left iliac metastasis, no evidence of disease progression Monthly Sandostatin continued Netspot scan 01/29/2019- overall stable to improved, 1 lesion left hepatic lobe has increased tracer activity-unchanged in size, majority of hepatic lesions have reduced activity compared to the pretreatment scan, decreased activity left iliac bone lesion, stable activity in the head of the pancreas lesion, no new lesions Monthly Sandostatin continued Netspot on September 24, 2019-increase in size of 3 liver lesions with associated  stable radiotracer activity, no new lesions, stable  small left iliac lesion Cycle 1 salvage Lutathera 10/27/2019 Cycle 2 salvage Lutathera 12/22/2019 Netspot on 02/15/2020-decrease in radiotracer activity in the liver metastases with a decrease in size of several lesions, no new lesions, no progressive disease in the bowel or mesentery, decreased activity in the left iliac bone lesion Monthly Sandostatin continued  Netspot 10/13/2020-decrease in radiotracer activity in the left and right liver, decreased size of liver lesions, one lesion in the superior right liver has increased in size and has no radiotracer activity, no evidence of metastatic disease outside the liver PET 11/15/2020- moderate hypermetabolic activity associated with several enlarging liver lesions that had limited activity on the dotatate PET, a lesion in the left lateral hepatic lobe with mild persistent dotatate activity has mild peripheral FDG activity and is decreased in size from a dotatate PET 1 year ago.  No hypermetabolic activity in the pancreas or bowel.  No hypermetabolic activity in the previous dotatate avid left iliac lesion Monthly Sandostatin continued Cycle 1 capecitabine/temozolomide 01/22/2021 (capecitabine twice daily days 1 through 14 of each 28-day cycle/temozolomide days 10 through 14) Cycle 2 capecitabine/temozolomide 02/19/2021 Cycle 3 capecitabine/temozolomide 03/19/2021 Cycle 4 capecitabine/temozolomide 04/17/2021 PET 05/11/2021-slight increase in size of several FDG avid liver lesions, no new hepatic lesions, no evidence of metastatic disease to the chest or bones Cycle 5 capecitabine/temozolomide 05/16/2021 Cycle 6 capecitabine/temozolomide 06/25/2021, capecitabine dose reduced Cycle 7 capecitabine/temozolomide 07/23/2021 Cycle 8 capecitabine/temozolomide 08/20/2021 PET 09/13/2021-mildly progressive hepatic metastases Everolimus 10/11/2021-discontinued after 2 weeks secondary to cost Cabozantinib 12/13/2021 Cabozantinib discontinued 12/24/2021 Cabozantinib resumed at  a reduced dose of 20 mg daily beginning 01/12/2022 PET 03/28/2022-multifocal tracer avid liver metastases, mild increase in size and able to mild increase in tracer uptake, no extrahepatic disease identified Cabozantinib continued at a dose of 20 mg daily PET 07/15/2022-slight interval increase in size of hepatic masses with slight decrease in metabolic activity slight increase in pelvic ascites, no new site of disease Cycle 1 FOLFOX 08/07/2022    Chest Seminoma in 1990 treated with BEP and chest radiation at [facility] 3. 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 chronic exertional dyspnea following treatment for the seminoma   5. aortic stenosis on an echocardiogram December 2011, severe aortic stenosis on echocardiogram 12/06/2015, status post TAVR on 04/16/2016 6. lumbar disc surgery 2014   7. acute abdominal pain 02/04/2014-resolved   8.  Diabetes 9.  COVID-19 infection July 2022 10.  Admission 08/13/2022 with dyspnea-evidence of volume overload with an elevated BNP, improved with diuresis.  Dyspnea multifactorial-cardiac disease with mitral valve, pulmonic valve, and tricuspid valve regurgitation   Ronald Thompson has metastatic increased neuroendocrine tumor.  The tumor has progressed following multiple systemic therapies.  He has medically secondary to tumor involving the liver.  He began salvage therapy with FOLFOX last week.  He tolerated the FOLFOX without significant acute toxicity.  He is admitted with dyspnea.  The dyspnea is multifactorial with components related to deconditioning, cardiac disease, pulmonary fibrosis, and restriction from the massive hepatomegaly.  He appears improved following diuresis.  He will continue torsemide at discharge from the hospital.  Ronald Campos will return to the cancer center for cycle 2 FOLFOX next week.  We will follow his  clinical status, the chromogranin a level, and a repeat CT of the liver as markers of tumor response.  I discussed the overall  poor prognosis with him.  He understand his life expectancy may be limited to weeks or a few months if the tumor does not respond to FOLFOX.  Recommendations: Torsemide as recommended by cardiology Outpatient follow-up as scheduled at the Cancer center 08/20/2022 Please call oncology as needed    LOS: 1 day   Betsy Coder, MD   08/15/2022, 12:03 PM

## 2022-08-15 NOTE — Progress Notes (Signed)
Nurse requested Mobility Specialist to perform oxygen saturation test with pt which includes removing pt from oxygen both at rest and while ambulating.  Below are the results from that testing.     Patient Saturations on Room Air at Rest = spO2 90%  Patient Saturations on Room Air while Ambulating = sp02 96% .  Rested and performed pursed lip breathing for 1 minute with sp02 at n/a%.  Patient Saturations on  0 Liters of oxygen while Ambulating = sp02 95%  At end of testing pt left in room on 93  Liters of oxygen.  Reported results to nurse.

## 2022-08-15 NOTE — Care Management (Addendum)
  Transition of Care [facility]) Screening Note   Patient Details  Name: Ronald Thompson Date of Birth: 1956-05-12   Transition of Care [facility]) CM/SW Contact:    Bethena Roys, RN Phone Number: 08/15/2022, 11:02 AM    Transition of Care Department [facility]) has reviewed the patient and no TOC needs have been identified at this time. Patient states he is from [employer]. Patient has transportation to appointments and gets medications without any issues. Patient reports that he will get a rolling walker from a friend. No home needs identified at this time. Plan for transition home today.

## 2022-08-16 ENCOUNTER — Encounter: Payer: Self-pay | Admitting: *Deleted

## 2022-08-16 ENCOUNTER — Telehealth: Payer: Self-pay | Admitting: *Deleted

## 2022-08-16 NOTE — Patient Outreach (Signed)
  Care Coordination [facility] Note Transition Care Management Unsuccessful Follow-up Telephone Call  Date of discharge and from where:  Thursday 08/15/22; [place]; Acute on chronic CHF in setting of neuroendocrine tumor with mets  Attempts:  1st Attempt  Reason for unsuccessful TCM follow-up call:  Left voice message  Oneta Rack, RN, BSN, CCRN Alumnus RN CM Care Coordination/ Transition of [place] Management [phone]: direct office

## 2022-08-18 ENCOUNTER — Other Ambulatory Visit: Payer: Self-pay | Admitting: Oncology

## 2022-08-18 DIAGNOSIS — D3A8 Other benign neuroendocrine tumors: Secondary | ICD-10-CM

## 2022-08-19 ENCOUNTER — Encounter: Payer: Self-pay | Admitting: *Deleted

## 2022-08-19 ENCOUNTER — Non-Acute Institutional Stay: Payer: PRIVATE HEALTH INSURANCE | Admitting: Family Medicine

## 2022-08-19 ENCOUNTER — Telehealth: Payer: Self-pay | Admitting: *Deleted

## 2022-08-19 ENCOUNTER — Encounter: Payer: Self-pay | Admitting: Family Medicine

## 2022-08-19 VITALS — BP 94/50 | HR 95 | Temp 98.5°F | Resp 18 | Wt 150.0 lb

## 2022-08-19 DIAGNOSIS — R5381 Other malaise: Secondary | ICD-10-CM

## 2022-08-19 DIAGNOSIS — Z515 Encounter for palliative care: Secondary | ICD-10-CM

## 2022-08-19 DIAGNOSIS — I5033 Acute on chronic diastolic (congestive) heart failure: Secondary | ICD-10-CM

## 2022-08-19 DIAGNOSIS — I10 Essential (primary) hypertension: Secondary | ICD-10-CM

## 2022-08-19 DIAGNOSIS — N3281 Overactive bladder: Secondary | ICD-10-CM

## 2022-08-19 NOTE — Progress Notes (Unsigned)
[facility] Consult Note Telephone: [phone]  Fax: [phone]   Date of encounter: 08/19/22 2:53 PM PATIENT NAME: Buel Molder [place] [address]   [phone] (home)  DOB: 21-Sep-1955 MRN: [id number] PRIMARY CARE PROVIDER:    [name], MD,  [place] [address] [phone]  REFERRING PROVIDER:   [name], MD [address] [place],  [place] [address] [phone]  RESPONSIBLE PARTY:    Contact Information     Name Relation Home Work Mobile   Human,Anne Spouse [phone]  [phone]   Aleczander, Fandino   [phone]   Marylynn Pearson   [phone]        I met face to face with patient, in middle of visit pt conferenced in son/HC POA by phone and at end family returned to independent living facility at [facility]. Palliative Care was asked to follow this patient by consultation request of  [name], MD to address advance care planning and complex medical decision making. This is the initial visit.          ASSESSMENT, SYMPTOM MANAGEMENT AND PLAN / RECOMMENDATIONS:   Hypertension BP at lower limits of normal. Encourage increased fluid intake. Recommend if weight continues to decline considering adjustment in Losartan dose vs spacing out of diuretic.  Acute on chronic diastolic CHF Acute phase resolved. Educated on s/sx of acute exacerbation-increased fatigue, decreased activity tolerance, weight gain of 3 lbs/24 hrs or 5 lbs/week, worsening edema, increased SOB or PND   Physical debility Using a borrowed rollator for increased stability. Advised to remove throw rug in living room which could represent a fall hazard when using the rollator.   Palliative Care Encounter Son who is [employer] POA is an attorney and has helped pt get his living will and HCPOA done. He will email documents for upload into Epic. Discussed goals of care with regard MOST  and advised pt/son this was the medical order to carry out living will. Emailed web version of [facility] MOST and left copy with pt for them to review. Advised pt that Palliative could work with Oncologist and [place] for symptom management and coordination. Educated pt and son on differences between Palliative and Hospice levels of care being primarily one of increased services with Hospice but he will have to no longer be on chemotherapy. Educated on differences between [facility] and [employer] in level of service.   Per pt's request emailed a list of home care agencies, phone numbers and contact individuals to pt's son for them to follow up and express needs to see which one best matches and will align with his long term care policy.  5.  Overactive Bladder Previously on Tamsulosin 0.4 mg but did not know whether to restart. Advised that likely this is ok to restart, discuss with PCP but would recommend nighttime administration due to some drop in BP as this would be less of a fall risk.  Follow up Palliative Care Visit: Palliative care will continue to follow for complex medical decision making, advance care planning, and clarification of goals. Return 2-3 weeks or prn.    This visit was coded based on medical decision making (MDM).  PPS: 60%  HOSPICE ELIGIBILITY/DIAGNOSIS: TBD  Chief Complaint:  [facility] received a referral to follow up with patient for chronic disease management in setting of metastatic neuroendocrine tumor of the pancreas.  Palliative Care is also following  to assist with advance directive planning and defining/refining goals of care.   HISTORY OF PRESENT ILLNESS:  Rajveer Handler is a 67 y.o. year old male with metastatic neuroendocrine tumor of the pancreas with liver mets currently on Folfox chemotherapy.  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  He  has significant valvular heart disease affecting mitral and aortic valves as well as severe pulmonary hypertension which are contributing to heart failure.  He has diabetes which is diet controlled and CKD stage 3.  He is primary caregiver to wife who is in early to mid stage Alzheimers. He maintains their ILF apartment cleaning and doing laundry.  He has had increased fatigue and more significant energy expenditure attempting to maintain this and states he needs help.  If he doesn't respond to Folfox then he was told he may have weeks to months left.   Early satiety with minimal intake and poor appetite. He does try to eat Has family who is an Forensic psychologist, Doctor, hospital.  Weight today was 150 lbs on his home scale. He is weighing daily and understands the need to adjust his Torsemide dose based off his weight.  He is having some sensation of heaviness on his chest intermittently though much less than previously.  Is able to lay flat and breathe.  Denies nausea and vomiting.  He is due to have next chemotherapy tomorrow.  Had been 156 lbs at the hospital. He is continent of bowel and bladder but gets up to urinate hourly or whenever the sensation first hits him. He was on Tamsulosin, taking in the am but this was held in the hospital.  He doesn't know if he should resume. He did have one fall last month in the bathroom without significant injury.  Denies dysphagia. He is Catholic and planning for his funeral, has discussed the need for heroic measures from the lens of his religion and was told that there is no mandate for aggressive lifesustaining measures. [email or web address]. Email MOST and names of facilities.  History obtained from review of EMR, discussion interview with family and/or Mr. Neubert.             Component Ref Range & Units 5 d ago (08/15/22) 6 d ago (08/14/22) 7 d ago (08/13/22) 7 d ago (08/13/22) 2 wk ago (08/06/22) 1 mo ago (07/18/22) 2 mo ago (06/20/22)  Sodium 135 - 145 mmol/L 137 139  138 140 141 138 140  Potassium 3.5 - 5.1 mmol/L 3.9 4.4 4.1 4.3 4.1 4.4 4.3  Chloride 98 - 111 mmol/L 95 Low  96 Low  96 Low  101 104 101 100  CO2 22 - 32 mmol/L 30 33 High  32 '31 28 28 31  '$ Glucose, Bld 70 - 99 mg/dL 99 135 High  CM 110 High  CM 131 High  CM 102 High  CM 103 High  CM 99 CM  Comment: Glucose reference range applies only to samples taken after fasting for at least 8 hours.  BUN 8 - 23 mg/dL 24 High  '22 17 21 23 21 23  '$ Creatinine, Ser 0.61 - 1.24 mg/dL 1.24 1.43 High  1.09 0.85 0.85 1.09 1.14  Calcium 8.9 - 10.3 mg/dL 8.9 9.2 9.1 9.0 9.3 9.2 9.5  GFR, Estimated >60 mL/min >60 53 Low  CM >60 CM >60 CM     Comment: (NOTE) Calculated using the CKD-EPI Creatinine Equation (2021)  Anion gap 5 -  $'15 12 10 'p$ CM 10 CM 8 CM 9 CM 9 CM 9 CM               Component Ref Range & Units 7 d ago (08/13/22) 7 d ago (08/13/22) 2 wk ago (08/06/22) 1 mo ago (07/18/22) 2 mo ago (06/20/22) 2 mo ago (05/23/22) 3 mo ago (04/25/22)  Sodium 135 - 145 mmol/L 138 140 141 138 140 135 139  Potassium 3.5 - 5.1 mmol/L 4.1 4.3 4.1 4.4 4.3 4.3 4.3  Chloride 98 - 111 mmol/L 96 Low  101 104 101 100 99 102  CO2 22 - 32 mmol/L 32 '31 28 28 31 29 29  '$ Glucose, Bld 70 - 99 mg/dL 110 High  131 High  CM 102 High  CM 103 High  CM 99 CM 97 CM 98 CM  Comment: Glucose reference range applies only to samples taken after fasting for at least 8 hours.  BUN 8 - 23 mg/dL '17 21 23 21 23 25 '$ High  25 High   Creatinine, Ser 0.61 - 1.24 mg/dL 1.09 0.85 0.85 1.09 1.14 1.19 1.17  Calcium 8.9 - 10.3 mg/dL 9.1 9.0 9.3 9.2 9.5 9.5 9.1  Total Protein 6.5 - 8.1 g/dL 6.3 Low  6.2 Low  6.6 6.5 7.0 6.8 6.7  Albumin 3.5 - 5.0 g/dL 3.3 Low  3.6 4.0 3.9 4.3 4.3 4.3  AST 15 - 41 U/L 37 29 32 38 36 37 36  ALT 0 - 44 U/L '24 20 18 22 25 27 23  '$ Alkaline Phosphatase 38 - 126 U/L 145 High  128 High  104 100 101 94 89  Total Bilirubin 0.3 - 1.2 mg/dL 1.8 High  1.5 High  1.8 High  1.3 High  1.7 High  1.5 High  1.5 High   GFR, Estimated >60 mL/min >60  >60 CM       0 Result Notes           Component Ref Range & Units 7 d ago (08/13/22) 2 wk ago (08/06/22) 1 mo ago (07/18/22) 2 mo ago (06/20/22) 2 mo ago (05/23/22) 3 mo ago (04/25/22) 4 mo ago (04/04/22)  WBC 4.0 - 10.5 K/uL 5.5 5.8 4.1 4.1 4.6 4.8 5.0  RBC 4.22 - 5.81 MIL/uL 2.89 Low  3.18 Low  3.46 Low  3.70 Low  3.84 Low  3.99 Low  3.86 Low   Hemoglobin 13.0 - 17.0 g/dL 9.8 Low  10.9 Low  11.8 Low  12.8 Low  13.1 13.5 13.0  HCT 39.0 - 52.0 % 30.1 Low  33.3 Low  35.7 Low  38.3 Low  39.0 40.1 38.3 Low   MCV 80.0 - 100.0 fL 104.2 High  104.7 High  103.2 High  103.5 High  101.6 High  100.5 High  99.2  MCH 26.0 - 34.0 pg 33.9 34.3 High  34.1 High  34.6 High  34.1 High  33.8 33.7  MCHC 30.0 - 36.0 g/dL 32.6 32.7 33.1 33.4 33.6 33.7 33.9  RDW 11.5 - 15.5 % 14.2 14.4 13.9 14.5 15.0 15.0 14.9  Platelets 150 - 400 K/uL 120 Low  134 Low  131 Low  109 Low  117 Low  103 Low  113 Low   nRBC 0.0 - 0.2 % 0.4 High  0.0 0.0 0.0 0.0 0.0 0.0  Comment: Performed at [facility], [address] [place], [place] [address]  Neutrophils Relative %   78 R 75 R 71 R 73 R 77 R 74 R  Basophils Absolute   0.0 R 0.0 R 0.0 R 0.0 R 0.0 R 0.0 R  Immature Granulocytes   0 R 0 R 0 R 0 R 0 R 0 R  Abs Immature Granulocytes   0.01 R, CM 0.01 R, CM 0.01 R, CM 0.01 R, CM 0.01 R, CM 0.01 R, CM  Neutro Abs   4.5 R 3.1 R 2.9 R 3.4 R 3.6 R 3.7 R  Lymphocytes Relative   5 R 8 R 10 R 10 R 10 R 9 R  Lymphs Abs   0.3 Low  R 0.3 Low  R 0.4 Low  R 0.4 Low  R 0.5 Low  R 0.5 Low  R  Monocytes Relative   15 R 13 R 15 R 13 R 10 R 13 R  Monocytes Absolute   0.9 R 0.5 R 0.6 R 0.6 R 0.5 R 0.6 R  Eosinophils Relative   1 R 3 R 3 R 3 R 3 R 3 R  Eosinophils Absolute   0.1 R 0.1 R 0.1 R 0.1 R 0.1 R 0.2 R  Basophils Relative   1 R 1 R 1 R 1 R 0 R 1 R  Resulting Agency  [facility]         Specimen Collected: 08/13/22 15:46 Last Resulted: 08/13/22  15:52      Lab Flowsheet      Order Details      View Encounter      Lab and Collection Details      Routing      Result History    View All Conversations on this Encounter      CM=Additional comments  R=Reference range differs from displayed range      Result Care Coordination   Patient Communication   Add Comments   Add Notifications  Back to Top        CBC (Order [id number])  Contains abnormal data CBC Order: [id number] Status: Final result     Visible to patient: Yes (not seen)     Next appt: 08/22/2022 at 01:00 PM in Oncology ([facility] ROOM)   0 Result Notes  important suggestion  Newer results are available. Click to view them now.             Component Ref Range & Units 7 d ago (08/13/22) 2 wk ago (08/06/22) 1 mo ago (07/18/22) 2 mo ago (06/20/22) 2 mo ago (05/23/22) 3 mo ago (04/25/22) 4 mo ago (04/04/22)  WBC 4.0 - 10.5 K/uL 5.5 5.8 4.1 4.1 4.6 4.8 5.0  RBC 4.22 - 5.81 MIL/uL 2.89 Low  3.18 Low  3.46 Low  3.70 Low  3.84 Low  3.99 Low  3.86 Low   Hemoglobin 13.0 - 17.0 g/dL 9.8 Low  10.9 Low  11.8 Low  12.8 Low  13.1 13.5 13.0  HCT 39.0 - 52.0 % 30.1 Low  33.3 Low  35.7 Low  38.3 Low  39.0 40.1 38.3 Low   MCV 80.0 - 100.0 fL 104.2 High  104.7 High  103.2 High  103.5 High  101.6 High  100.5 High  99.2  MCH 26.0 - 34.0 pg 33.9 34.3 High  34.1 High  34.6 High  34.1 High  33.8 33.7  MCHC 30.0 - 36.0 g/dL 32.6 32.7 33.1 33.4 33.6 33.7 33.9  RDW 11.5 - 15.5 % 14.2 14.4 13.9 14.5 15.0 15.0  14.9  Platelets 150 - 400 K/uL 120 Low  134 Low  131 Low  109 Low  117 Low  103 Low  113 Low   nRBC 0.0 - 0.2 % 0.4 High  0.0 0.0 0.0 0.0 0.0 0.0  Comment: Performed at [facility], [place], [place] [address]  Neutrophils Relative %   78 R 75 R 71 R 73 R 77 R 74 R  Basophils Absolute   0.0 R 0.0 R 0.0 R 0.0 R 0.0 R 0.0 R  Immature Granulocytes   0 R 0 R 0 R 0 R 0 R 0 R  Abs Immature Granulocytes   0.01 R, CM 0.01 R, CM 0.01 R, CM 0.01  R, CM 0.01 R, CM 0.01 R, CM  Neutro Abs   4.5 R 3.1 R 2.9 R 3.4 R 3.6 R 3.7 R  Lymphocytes Relative   5 R 8 R 10 R 10 R 10 R 9 R  Lymphs Abs   0.3 Low  R 0.3 Low  R 0.4 Low  R 0.4 Low  R 0.5 Low  R 0.5 Low  R  Monocytes Relative   15 R 13 R 15 R 13 R 10 R 13 R  Monocytes Absolute   0.9 R 0.5 R 0.6 R 0.6 R 0.5 R 0.6 R  Eosinophils Relative   1 R 3 R 3 R 3 R 3 R 3 R  Eosinophils Absolute   0.1 R 0.1 R 0.1 R 0.1 R 0.1 R 0.2 R  Basophils Relative   1 R 1 R 1 R 1 R 0 R 1 R  Resulting Agency  [facility]         Specimen Collected: 08/13/22 15:46 Last Resulted: 08/13/22 15:52       07/15/22 NM Pet CT Skull base to thighs: IMPRESSION: 1. Slight interval increase in size of hepatic masses with slight decrease in metabolic activity. Enlarging hepatic lesions appears to exert increased mass effect upon structures in the upper abdomen and mid abdomen. 2. No new sites of disease. 3. Slight increase in pelvic ascites now small to moderate volume. 4. Aortic atherosclerosis and coronary artery calcifications.   Aortic Atherosclerosis (ICD10-I70.0).  08/13/22 CT abd/pelvis with contrast: IMPRESSION: 1. Markedly enlarged liver with numerous large hepatic metastatic lesions without gross interval change in size as compared with PET CT from December. 2. Small bilateral pleural effusions with partial atelectasis or pneumonia in the right lower lobe. Cardiomegaly. 3. Aortic atherosclerosis.   08/13/22 CTA chest PE w/ and/or wo contrast: IMPRESSION: Status post TAVR.  Enlarged heart.   There is calcifications along the main pulmonary artery with dilated pulmonary arterial vessels diffusely. There is also what appears to be a high-grade stricture or stenosis involving the origin of the left main pulmonary artery with variant enhancement of the pulmonary artery distal to the stenosis. Please correlate with any known history or  further workup is recommended.   Development of a small right pleural effusion with adjacent parenchymal opacity. Atelectasis versus infiltrate.   Stable tiny left effusion.   Areas of interstitial septal thickening.   Chest port.   Please see separate dictation of abdomen and pelvis CT.   Aortic Atherosclerosis (ICD10-I70.0).  08/14/22 2d echo LV EF 65-70%, moderate concentric LVH, no regional wall motion abnormalities.  Interventricular septum is flattened in systole, consistent with right  ventricular pressure  overload.  RV systolic function mildly reduced.  Severe enlargement RV size and severely elevated PASP.  Estimated RVSP is 76.6 mm HG. Aortic valve s/p TAVR with trivial regurg Degenerative MV with mild regurgitation and severe mitral stenosis. Moderate PR Severe TR  I reviewed EMR for available labs, medications, imaging, studies and related documents.  Records reviewed and summarized above  ROS General: NAD ENMT: denies dysphagia Cardiovascular: denies chest pain but +intermittent chest pressure, denies DOE/orthopnea or PND Pulmonary: endorses NP cough intermittently, denies increased SOB Abdomen: endorses poor appetite/early satiety, denies constipation, endorses continence of bowel GU: denies dysuria, endorses continence of urine, endorses urinary frequency MSK:  reports increased fatigue and weakness, single fall reported Skin: denies rashes or wounds Neurological: denies pain, denies insomnia Psych: Endorses positive mood Heme/lymph/immuno: denies bruises, abnormal bleeding  Physical Exam: Current and past weights: 150 lbs on home scale today Constitutional: NAD General:  thin, WD EYES: anicteric sclera ENMT: intact hearing, oral mucous membranes moist, dentition intact CV: S1S2, RRR with low grade murmur, no LE edema Pulmonary: CTAB, no increased work of breathing, occasional dry cough, room air Abdomen: hypo-active BS + 4 quadrants, soft and non tender,  mild ascites GU: deferred MSK: no sarcopenia, moves all extremities, ambulatory Skin: warm and dry, no rashes or wounds on visible skin Neuro:  no generalized weakness, no cognitive impairment Psych: non-anxious affect, A and O x 3 Hem/lymph/immuno: no widespread bruising  CURRENT PROBLEM LIST:  Patient Active Problem List   Diagnosis Date Noted   Acute respiratory failure with hypoxia (HCC) 08/14/2022   Elevated troponin 08/14/2022   Pulmonary hypertension ([place]) 08/14/2022   Acute on chronic diastolic CHF (congestive heart failure) (HCC) 08/13/2022   Pulmonary artery stenosis 08/13/2022   Goals of care, counseling/discussion 11/01/2021   Vitamin D deficiency 05/27/2021   CKD (chronic kidney disease) stage 3, GFR 30-59 ml/min (HCC) 10/19/2020   Type 2 diabetes mellitus without complication, without long-term current use of insulin ([place]) 01/17/2020   Type 2 diabetes mellitus with hyperglycemia, without long-term current use of insulin (HCC) 10/11/2019   OAB (overactive bladder) 09/22/2019   Low back pain 03/29/2019   Cough 09/21/2018   Wheezing 09/21/2018   HTN (hypertension) 02/11/2018   Increased prostate specific antigen (PSA) velocity 02/11/2018   Carpal tunnel syndrome 10/04/2017   Right cervical radiculopathy 10/02/2017   Constipation 07/23/2016   Shoulder pain, left 07/23/2016   S/P TAVR (transcatheter aortic valve replacement) 04/16/2016   Dyspnea 11/08/2015   Pulmonary fibrosis ([place]) 11/08/2015   Nocturia 11/08/2015   Snoring 11/08/2015   Deviated nasal septum 11/08/2015   Bilateral hearing loss 11/08/2015   Erectile dysfunction 02/07/2015   Fatigue 02/07/2015   Olecranon bursitis 09/27/2014   Essential tremor 09/27/2014   Primary neuroendocrine tumor of pancreas 02/14/2014   Abdominal pain, unspecified site 02/04/2014   Dehydration 02/04/2014   Aortic stenosis 02/03/2014   History of shingles 02/03/2014   Left lumbar radiculopathy 11/30/2012   Acute bronchitis  12/19/2011   Preventative health care 12/15/2011   HEART MURMUR, SYSTOLIC 17/61/6073   Hyperlipidemia 07/16/2007   BAKER'S CYST 06/06/2007   PAST MEDICAL HISTORY:  Active Ambulatory Problems    Diagnosis Date Noted   Hyperlipidemia 07/16/2007   BAKER'S CYST 06/06/2007   HEART MURMUR, SYSTOLIC 71/01/2693   Preventative health care 12/15/2011   Acute bronchitis 12/19/2011   Left lumbar radiculopathy 11/30/2012   Aortic stenosis 02/03/2014   History of shingles 02/03/2014   Abdominal pain, unspecified site 02/04/2014  Dehydration 02/04/2014   Primary neuroendocrine tumor of pancreas 02/14/2014   Olecranon bursitis 09/27/2014   Essential tremor 09/27/2014   Erectile dysfunction 02/07/2015   Fatigue 02/07/2015   Dyspnea 11/08/2015   Pulmonary fibrosis ([place]) 11/08/2015   Nocturia 11/08/2015   Snoring 11/08/2015   Deviated nasal septum 11/08/2015   Bilateral hearing loss 11/08/2015   S/P TAVR (transcatheter aortic valve replacement) 04/16/2016   Constipation 07/23/2016   Shoulder pain, left 07/23/2016   Right cervical radiculopathy 10/02/2017   Carpal tunnel syndrome 10/04/2017   HTN (hypertension) 02/11/2018   Increased prostate specific antigen (PSA) velocity 02/11/2018   Cough 09/21/2018   Wheezing 09/21/2018   Low back pain 03/29/2019   OAB (overactive bladder) 09/22/2019   Type 2 diabetes mellitus with hyperglycemia, without long-term current use of insulin ([place]) 10/11/2019   Type 2 diabetes mellitus without complication, without long-term current use of insulin ([place]) 01/17/2020   CKD (chronic kidney disease) stage 3, GFR 30-59 ml/min ([place]) 10/19/2020   Vitamin D deficiency 05/27/2021   Goals of care, counseling/discussion 11/01/2021   Acute on chronic diastolic CHF (congestive heart failure) ([place]) 08/13/2022   Pulmonary artery stenosis 08/13/2022   Acute respiratory failure with hypoxia ([place]) 08/14/2022   Elevated troponin 08/14/2022   Pulmonary hypertension ([place])  08/14/2022   Resolved Ambulatory Problems    Diagnosis Date Noted   SEMINOMA 10/15/2008   PREHYPERTENSION 10/14/2008   Bilateral hearing loss 02/03/2014   Pancreatic mass 02/04/2014   Hyperglycemia 01/08/2017   Cervical spinal stenosis 05/27/2021   Past Medical History:  Diagnosis Date   Baker's cyst    Gallstones    Heart murmur    Hypercholesteremia    Hypertension    Lesion of left lung    Liver metastasis    Occlusion of left subclavian vein (HCC) 1990   Pericarditis    Pneumonia 1990   Shortness of breath dyspnea    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   Transfusion history    SOCIAL HX:  Social History   Tobacco Use   Smoking status: Never   Smokeless tobacco: Never  Substance Use Topics   Alcohol use: Yes    Comment: rare- social   FAMILY HX:  Family History  Problem Relation Age of Onset   Dementia Mother    Cancer Paternal Grandfather        esophagus   Cancer Maternal Aunt        colon   Emphysema Maternal Aunt       Preferred Pharmacy: ALLERGIES:  Allergies  Allergen Reactions   Penicillins Hives    ENTIRE BODY Has patient had a PCN reaction causing immediate rash, facial/tongue/throat swelling, SOB or lightheadedness with hypotension: No Has patient had a PCN reaction causing severe rash involving mucus membranes or skin necrosis: No Has patient had a PCN reaction that required hospitalization * *  YES  * * Has patient had a PCN reaction occurring within the last 10 years: No If all of the above answers are "NO", then may proceed with Cephalosporin use. *reaction occurred when he was 17     PERTINENT MEDICATIONS:  Outpatient Encounter Medications as of 08/19/2022  Medication Sig   acetaminophen (TYLENOL) 500 MG tablet Take 2 tablets (1,000 mg total) by mouth every 8 (eight) hours as needed for headache.   albuterol (VENTOLIN HFA) 108 (90 Base) MCG/ACT inhaler Inhale 1-2 puffs into the lungs every 6 (six) hours as needed for wheezing or shortness  of breath. (Patient not taking: Reported  on 08/14/2022)   Aromatic Inhalants (VICKS VAPOR INHALER IN) Inhale 1 spray into the lungs daily as needed (congestion).   Cholecalciferol (VITAMIN D-3) 125 MCG (5000 UT) TABS Take 1 tablet by mouth daily in the afternoon.   diphenhydrAMINE (BENADRYL) 25 MG tablet Take 25-50 mg by mouth at bedtime.   Lancet Device MISC Use as instructed to test blood sugar daily E11.65   Lancets (ONETOUCH DELICA PLUS [id number]) MISC TEST ONCE DAILY   lidocaine-prilocaine (EMLA) cream Apply to portacath site 1 hour prior to use   loperamide (IMODIUM) 2 MG capsule Take 2 mg by mouth as needed for diarrhea or loose stools.   losartan (COZAAR) 100 MG tablet Take 1 tablet (100 mg total) by mouth daily.   octreotide (SANDOSTATIN LAR) 30 MG injection Inject 30 mg into the muscle every 28 (twenty-eight) days. (Patient not taking: Reported on 08/14/2022)   oxymetazoline (AFRIN) 0.05 % nasal spray Place 2 sprays into both nostrils at bedtime as needed for congestion. (Patient not taking: Reported on 08/14/2022)   prochlorperazine (COMPAZINE) 10 MG tablet Take 1 tablet (10 mg total) by mouth every 6 (six) hours as needed for nausea or vomiting.   simvastatin (ZOCOR) 20 MG tablet TAKE ONE TABLET BY MOUTH DAILY AT 8PM (Patient taking differently: Take 20 mg by mouth daily at 6 PM. TAKE ONE TABLET BY MOUTH DAILY AT 8PM)   tamsulosin (FLOMAX) 0.4 MG CAPS capsule Take 1 capsule (0.4 mg total) by mouth daily. (Patient not taking: Reported on 08/14/2022)   torsemide (DEMADEX) 20 MG tablet Take 1 tablet (20 mg total) by mouth daily.   Facility-Administered Encounter Medications as of 08/19/2022  Medication   heparin lock flush 100 unit/mL   octreotide (SANDOSTATIN LAR) 30 MG IM injection     ------------------------------------------ Advance Care Planning/Goals of Care: Goals include to maximize quality of life and symptom management. Patient & health care surrogate gave their permission to  discuss.Our advance care planning conversation included a discussion about:    Pt has living will and [facility] POA Experiences with loved ones who have been seriously ill or have died  Exploration of personal, cultural or spiritual beliefs that might influence medical decisions  Exploration of goals of care in the event of a sudden injury or illness  Identification of a healthcare agent-HC POA is son Ed Niebuhr Review of an advance directive document-MOST .  CODE STATUS: Currently full code    Thank you for the opportunity to participate in the care of Mr. Oetken.  The palliative care team will continue to follow. Please call our office at [phone] if we can be of additional assistance.   Marijo Conception, FNP-C  COVID-19 PATIENT SCREENING TOOL Asked and negative response unless otherwise noted:  Have you had symptoms of covid, tested positive or been in contact with someone with symptoms/positive test in the past 5-10 days? unknown

## 2022-08-19 NOTE — Patient Outreach (Signed)
  Care Coordination [facility] Note Transition Care Management Unsuccessful Follow-up Telephone Call  Date of discharge and from where:  Thursday 08/15/22; [place]; Acute on chronic CHF in setting of neuroendocrine tumor with mets   Attempts:  2nd Attempt  Reason for unsuccessful TCM follow-up call:  Left voice message  Oneta Rack, RN, BSN, CCRN Alumnus RN CM Care Coordination/ Transition of [place] Management [phone]: direct office

## 2022-08-19 NOTE — Patient Outreach (Signed)
Care Coordination [facility] Note Transition Care Management Follow-up Telephone Call Date of discharge and from where: Thursday 08/15/22; [place]; Acute on chronic CHF in setting of neuroendocrine tumor with mets   How have you been since you were released from the hospital? "I am doing much much better; my swelling has all gone down and my weight is staying down too, I am back to 150 lbs.  We live in an independent living facility which has been very helpful to both of [place]-- my wife has early onset alzheimer's disease and is in the middle stage of progression; she is still able to do things for herself, but I have to help her; my family is great-- they are all involved and also helping with anything we need.  I have Palliative Care in place and they are coming to visit me on Wednesday after I have my chemo tomorrow.  They have a Education officer, museum there that is helping [place] get out long term care policy in place with details- and my son who is an attorney is also working on that for [place]." Any questions or concerns? No  Items Reviewed: Did the pt receive and understand the discharge instructions provided? Yes  Medications obtained and verified? Yes - verified patient has obtained and is taking newly prescribed diuretic as instructed; he declines full medication review; states that he self-manages medications and denies questions/ concerns around medications; he reports he is currently established/ active with [facility] Palliative Care team, and reports they will be visiting him at his home on Wednesday 08/21/22 and will review medications with them then Other? No  Any new allergies since your discharge? No  Dietary orders reviewed? Yes Do you have support at home? Yes - patient reports he is essentially independent in self-care activities; family assisting as indicated  Home Care and Equipment/Supplies: Were home health services ordered? no If so, what is the name of the agency? N/A  Has the agency set up a  time to come to the patient's home? not applicable Were any new equipment or medical supplies ordered?  No What is the name of the medical supply agency? N/A Were you able to get the supplies/equipment? not applicable Do you have any questions related to the use of the equipment or supplies? No N/A  Functional Questionnaire: (I = Independent and D = Dependent) ADLs: I  Bathing/Dressing- I  Meal Prep- I  Eating- I  Maintaining continence- I  Transferring/Ambulation- I  Managing Meds- I  Follow up appointments reviewed:  PCP Hospital f/u appt confirmed? No  Scheduled to see - on - @ [facility] f/u appt confirmed? Yes  Scheduled to see oncology provider on Tuesday 08/20/22 @ 8:00 am; and cardiology provider on Tuesday 08/27/22 at 10:05 am Are transportation arrangements needed? No  If their condition worsens, is the pt aware to call PCP or go to the Emergency Dept.? Yes Was the patient provided with contact information for the PCP's office or ED? No Was to pt encouraged to call back with questions or concerns? Yes- provided my direct contact information should needs/ questions/ concerns arise post-recent hospital discharge  SDOH assessments and interventions completed:   Yes SDOH Interventions Today    Flowsheet Row Most Recent Value  SDOH Interventions   Food Insecurity Interventions Intervention Not Indicated  [[place] in [facility] [place] that [place] meals]  Transportation Interventions Intervention Not Indicated  [patient reports he continues to drive self]      Care Coordination Interventions:  Provided education around reinforcement of importance of daily weights in setting of CHF; [facility] CM services; encouraged patient's ongoing engagement with palliative care team currently established in his care    Encounter Outcome:  Pt. Visit Completed    Oneta Rack, RN, BSN, CCRN Alumnus RN CM Care Coordination/ Transition of [place]  Management [phone]: direct office

## 2022-08-20 ENCOUNTER — Inpatient Hospital Stay (HOSPITAL_BASED_OUTPATIENT_CLINIC_OR_DEPARTMENT_OTHER): Payer: Medicare Other | Admitting: Oncology

## 2022-08-20 ENCOUNTER — Inpatient Hospital Stay: Payer: Medicare Other

## 2022-08-20 ENCOUNTER — Telehealth: Payer: Self-pay | Admitting: *Deleted

## 2022-08-20 ENCOUNTER — Encounter: Payer: Self-pay | Admitting: Family Medicine

## 2022-08-20 VITALS — BP 106/64 | HR 86 | Resp 18

## 2022-08-20 DIAGNOSIS — D3A8 Other benign neuroendocrine tumors: Secondary | ICD-10-CM

## 2022-08-20 DIAGNOSIS — C7A8 Other malignant neuroendocrine tumors: Secondary | ICD-10-CM | POA: Diagnosis not present

## 2022-08-20 DIAGNOSIS — C7B8 Other secondary neuroendocrine tumors: Secondary | ICD-10-CM | POA: Diagnosis not present

## 2022-08-20 DIAGNOSIS — Z452 Encounter for adjustment and management of vascular access device: Secondary | ICD-10-CM | POA: Diagnosis not present

## 2022-08-20 DIAGNOSIS — Z5111 Encounter for antineoplastic chemotherapy: Secondary | ICD-10-CM | POA: Diagnosis not present

## 2022-08-20 DIAGNOSIS — Z515 Encounter for palliative care: Secondary | ICD-10-CM | POA: Insufficient documentation

## 2022-08-20 DIAGNOSIS — R5381 Other malaise: Secondary | ICD-10-CM | POA: Insufficient documentation

## 2022-08-20 LAB — CMP (CANCER CENTER ONLY)
ALT: 24 U/L (ref 0–44)
AST: 53 U/L — ABNORMAL HIGH (ref 15–41)
Albumin: 3.7 g/dL (ref 3.5–5.0)
Alkaline Phosphatase: 180 U/L — ABNORMAL HIGH (ref 38–126)
Anion gap: 8 (ref 5–15)
BUN: 56 mg/dL — ABNORMAL HIGH (ref 8–23)
CO2: 34 mmol/L — ABNORMAL HIGH (ref 22–32)
Calcium: 9.3 mg/dL (ref 8.9–10.3)
Chloride: 94 mmol/L — ABNORMAL LOW (ref 98–111)
Creatinine: 1.62 mg/dL — ABNORMAL HIGH (ref 0.61–1.24)
GFR, Estimated: 46 mL/min — ABNORMAL LOW (ref >60)
Glucose, Bld: 136 mg/dL — ABNORMAL HIGH (ref 70–99)
Potassium: 4.6 mmol/L (ref 3.5–5.1)
Sodium: 136 mmol/L (ref 135–145)
Total Bilirubin: 1.2 mg/dL (ref 0.3–1.2)
Total Protein: 6.6 g/dL (ref 6.5–8.1)

## 2022-08-20 LAB — CBC WITH DIFFERENTIAL (CANCER CENTER ONLY)
Abs Immature Granulocytes: 0.02 10*3/uL (ref 0.00–0.07)
Basophils Absolute: 0 10*3/uL (ref 0.0–0.1)
Basophils Relative: 1 %
Eosinophils Absolute: 0.2 10*3/uL (ref 0.0–0.5)
Eosinophils Relative: 2 %
HCT: 30.5 % — ABNORMAL LOW (ref 39.0–52.0)
Hemoglobin: 10.2 g/dL — ABNORMAL LOW (ref 13.0–17.0)
Immature Granulocytes: 0 %
Lymphocytes Relative: 3 %
Lymphs Abs: 0.3 10*3/uL — ABNORMAL LOW (ref 0.7–4.0)
MCH: 34.2 pg — ABNORMAL HIGH (ref 26.0–34.0)
MCHC: 33.4 g/dL (ref 30.0–36.0)
MCV: 102.3 fL — ABNORMAL HIGH (ref 80.0–100.0)
Monocytes Absolute: 1.1 10*3/uL — ABNORMAL HIGH (ref 0.1–1.0)
Monocytes Relative: 13 %
Neutro Abs: 6.9 10*3/uL (ref 1.7–7.7)
Neutrophils Relative %: 81 %
Platelet Count: 197 10*3/uL (ref 150–400)
RBC: 2.98 MIL/uL — ABNORMAL LOW (ref 4.22–5.81)
RDW: 14.1 % (ref 11.5–15.5)
WBC Count: 8.4 10*3/uL (ref 4.0–10.5)
nRBC: 0 % (ref 0.0–0.2)

## 2022-08-20 MED ORDER — OXALIPLATIN CHEMO INJECTION 100 MG/20ML
65.0000 mg/m2 | Freq: Once | INTRAVENOUS | Status: AC
  Administered 2022-08-20: 125 mg via INTRAVENOUS
  Filled 2022-08-20: qty 10

## 2022-08-20 MED ORDER — LEUCOVORIN CALCIUM INJECTION 350 MG
300.0000 mg/m2 | Freq: Once | INTRAVENOUS | Status: AC
  Administered 2022-08-20: 568 mg via INTRAVENOUS
  Filled 2022-08-20: qty 28.4

## 2022-08-20 MED ORDER — DEXTROSE 5 % IV SOLN: Freq: Once | INTRAVENOUS | Status: AC

## 2022-08-20 MED ORDER — FLUOROURACIL CHEMO INJECTION 2.5 GM/50ML
300.0000 mg/m2 | Freq: Once | INTRAVENOUS | Status: AC
  Administered 2022-08-20: 550 mg via INTRAVENOUS
  Filled 2022-08-20: qty 11

## 2022-08-20 MED ORDER — SODIUM CHLORIDE 0.9 % IV SOLN
10.0000 mg | Freq: Once | INTRAVENOUS | Status: AC
  Administered 2022-08-20: 10 mg via INTRAVENOUS
  Filled 2022-08-20: qty 10

## 2022-08-20 MED ORDER — OCTREOTIDE ACETATE 30 MG IM KIT
30.0000 mg | PACK | Freq: Once | INTRAMUSCULAR | Status: AC
  Administered 2022-08-20: 30 mg via INTRAMUSCULAR
  Filled 2022-08-20: qty 1

## 2022-08-20 MED ORDER — SODIUM CHLORIDE 0.9 % IV SOLN
2000.0000 mg/m2 | INTRAVENOUS | Status: DC
  Administered 2022-08-20: 3800 mg via INTRAVENOUS
  Filled 2022-08-20: qty 76

## 2022-08-20 MED ORDER — PALONOSETRON HCL INJECTION 0.25 MG/5ML
0.2500 mg | Freq: Once | INTRAVENOUS | Status: AC
  Administered 2022-08-20: 0.25 mg via INTRAVENOUS
  Filled 2022-08-20: qty 5

## 2022-08-20 NOTE — Progress Notes (Signed)
Patient seen by Dr. [name] today  Vitals are within treatment parameters.   Labs reviewed by Dr. [name] and are NOT within treatment parameters (Scr 1.62).   Per physician team, patient is ok to proceed with treatment and there are NO modifications to the treatment plan.  Per Dr. [name], patient instructed to hold dose of losartan and torsemide on 08/21/22 (patient stated he already took today's dose.  Patient verbalized understanding.

## 2022-08-20 NOTE — Patient Instructions (Signed)
[facility]   Discharge Instructions: Thank you for choosing [facility] to provide your oncology and hematology care.   If you have a lab appointment with the [facility], please go directly to the [facility] and check in at the registration area.   Wear comfortable clothing and clothing appropriate for easy access to any Portacath or PICC line.   We strive to give you quality time with your provider. You may need to reschedule your appointment if you arrive late (15 or more minutes).  Arriving late affects you and other patients whose appointments are after yours.  Also, if you miss three or more appointments without notifying the office, you may be dismissed from the clinic at the provider's discretion.      For prescription refill requests, have your pharmacy contact our office and allow 72 hours for refills to be completed.    Today you received the following chemotherapy and/or immunotherapy agents Oxaliplatin (ELOXATIN), Leucovorin & Flourouracil (ADRUCIL).      To help prevent nausea and vomiting after your treatment, we encourage you to take your nausea medication as directed.  BELOW ARE SYMPTOMS THAT SHOULD BE REPORTED IMMEDIATELY: *FEVER GREATER THAN 100.4 F (38 C) OR HIGHER *CHILLS OR SWEATING *NAUSEA AND VOMITING THAT IS NOT CONTROLLED WITH YOUR NAUSEA MEDICATION *UNUSUAL SHORTNESS OF BREATH *UNUSUAL BRUISING OR BLEEDING *URINARY PROBLEMS (pain or burning when urinating, or frequent urination) *BOWEL PROBLEMS (unusual diarrhea, constipation, pain near the anus) TENDERNESS IN MOUTH AND THROAT WITH OR WITHOUT PRESENCE OF ULCERS (sore throat, sores in mouth, or a toothache) UNUSUAL RASH, SWELLING OR PAIN  UNUSUAL VAGINAL DISCHARGE OR ITCHING   Items with * indicate a potential emergency and should be followed up as soon as possible or go to the Emergency Department if any problems should occur.  Please show the CHEMOTHERAPY ALERT  CARD or IMMUNOTHERAPY ALERT CARD at check-in to the Emergency Department and triage nurse.  Should you have questions after your visit or need to cancel or reschedule your appointment, please contact [facility]  Dept: [phone]  and follow the prompts.  Office hours are 8:00 a.m. to 4:30 p.m. Monday - Friday. Please note that voicemails left after 4:00 p.m. may not be returned until the following business day.  We are closed weekends and major holidays. You have access to a nurse at all times for urgent questions. Please call the main number to the clinic Dept: [phone] and follow the prompts.   For any non-urgent questions, you may also contact your provider using MyChart. We now offer e-Visits for anyone 89 and older to request care online for non-urgent symptoms. For details visit [email or web address].   Also download the MyChart app! Go to the app store, search "MyChart", open the app, select Short, and log in with your MyChart username and password.  Oxaliplatin Injection What is this medication? OXALIPLATIN (ox AL i PLA tin) treats some types of cancer. It works by slowing down the growth of cancer cells. This medicine may be used for other purposes; ask your health care provider or pharmacist if you have questions. COMMON BRAND NAME(S): Eloxatin What should I tell my care team before I take this medication? They need to know if you have any of these conditions: Heart disease History of irregular heartbeat or rhythm Liver disease Low blood cell levels (white cells, red cells, and platelets) Lung or breathing disease, such as asthma Take medications that treat  or prevent blood clots Tingling of the fingers, toes, or other nerve disorder An unusual or allergic reaction to oxaliplatin, other medications, foods, dyes, or preservatives If you or your partner are pregnant or trying to get pregnant Breast-feeding How should I use this  medication? This medication is injected into a vein. It is given by your care team in a hospital or clinic setting. Talk to your care team about the use of this medication in children. Special care may be needed. Overdosage: If you think you have taken too much of this medicine contact a poison control center or emergency room at once. NOTE: This medicine is only for you. Do not share this medicine with others. What if I miss a dose? Keep appointments for follow-up doses. It is important not to miss a dose. Call your care team if you are unable to keep an appointment. What may interact with this medication? Do not take this medication with any of the following: Cisapride Dronedarone Pimozide Thioridazine This medication may also interact with the following: Aspirin and aspirin-like medications Certain medications that treat or prevent blood clots, such as warfarin, apixaban, dabigatran, and rivaroxaban Cisplatin Cyclosporine Diuretics Medications for infection, such as acyclovir, adefovir, amphotericin B, bacitracin, cidofovir, foscarnet, ganciclovir, gentamicin, pentamidine, vancomycin NSAIDs, medications for pain and inflammation, such as ibuprofen or naproxen Other medications that cause heart rhythm changes Pamidronate Zoledronic acid This list may not describe all possible interactions. Give your health care provider a list of all the medicines, herbs, non-prescription drugs, or dietary supplements you use. Also tell them if you smoke, drink alcohol, or use illegal drugs. Some items may interact with your medicine. What should I watch for while using this medication? Your condition will be monitored carefully while you are receiving this medication. You may need blood work while taking this medication. This medication may make you feel generally unwell. This is not uncommon as chemotherapy can affect healthy cells as well as cancer cells. Report any side effects. Continue your course  of treatment even though you feel ill unless your care team tells you to stop. This medication may increase your risk of getting an infection. Call your care team for advice if you get a fever, chills, sore throat, or other symptoms of a cold or flu. Do not treat yourself. Try to avoid being around people who are sick. Avoid taking medications that contain aspirin, acetaminophen, ibuprofen, naproxen, or ketoprofen unless instructed by your care team. These medications may hide a fever. Be careful brushing or flossing your teeth or using a toothpick because you may get an infection or bleed more easily. If you have any dental work done, tell your dentist you are receiving this medication. This medication can make you more sensitive to cold. Do not drink cold drinks or use ice. Cover exposed skin before coming in contact with cold temperatures or cold objects. When out in cold weather wear warm clothing and cover your mouth and nose to warm the air that goes into your lungs. Tell your care team if you get sensitive to the cold. Talk to your care team if you or your partner are pregnant or think either of you might be pregnant. This medication can cause serious birth defects if taken during pregnancy and for 9 months after the last dose. A negative pregnancy test is required before starting this medication. A reliable form of contraception is recommended while taking this medication and for 9 months after the last dose. Talk to your care  team about effective forms of contraception. Do not father a child while taking this medication and for 6 months after the last dose. Use a condom while having sex during this time period. Do not breastfeed while taking this medication and for 3 months after the last dose. This medication may cause infertility. Talk to your care team if you are concerned about your fertility. What side effects may I notice from receiving this medication? Side effects that you should report to  your care team as soon as possible: Allergic reactions--skin rash, itching, hives, swelling of the face, lips, tongue, or throat Bleeding--bloody or black, tar-like stools, vomiting blood or brown material that looks like coffee grounds, red or dark brown urine, small red or purple spots on skin, unusual bruising or bleeding Dry cough, shortness of breath or trouble breathing Heart rhythm changes--fast or irregular heartbeat, dizziness, feeling faint or lightheaded, chest pain, trouble breathing Infection--fever, chills, cough, sore throat, wounds that don't heal, pain or trouble when passing urine, general feeling of discomfort or being unwell Liver injury--right upper belly pain, loss of appetite, nausea, light-colored stool, dark yellow or brown urine, yellowing skin or eyes, unusual weakness or fatigue Low red blood cell level--unusual weakness or fatigue, dizziness, headache, trouble breathing Muscle injury--unusual weakness or fatigue, muscle pain, dark yellow or brown urine, decrease in amount of urine Pain, tingling, or numbness in the hands or feet Sudden and severe headache, confusion, change in vision, seizures, which may be signs of posterior reversible encephalopathy syndrome (PRES) Unusual bruising or bleeding Side effects that usually do not require medical attention (report to your care team if they continue or are bothersome): Diarrhea Nausea Pain, redness, or swelling with sores inside the mouth or throat Unusual weakness or fatigue Vomiting This list may not describe all possible side effects. Call your doctor for medical advice about side effects. You may report side effects to FDA at 1-800-FDA-1088. Where should I keep my medication? This medication is given in a hospital or clinic. It will not be stored at home. NOTE: This sheet is a summary. It may not cover all possible information. If you have questions about this medicine, talk to your doctor, pharmacist, or health care  provider.  2023 Elsevier/Gold Standard (2007-09-12 00:00:00)  Leucovorin Injection What is this medication? LEUCOVORIN (loo koe VOR in) prevents side effects from certain medications, such as methotrexate. It works by increasing folate levels. This helps protect healthy cells in your body. It may also be used to treat anemia caused by low levels of folate. It can also be used with fluorouracil, a type of chemotherapy, to treat colorectal cancer. It works by increasing the effects of fluorouracil in the body. This medicine may be used for other purposes; ask your health care provider or pharmacist if you have questions. What should I tell my care team before I take this medication? They need to know if you have any of these conditions: Anemia from low levels of vitamin B12 in the blood An unusual or allergic reaction to leucovorin, folic acid, other medications, foods, dyes, or preservatives Pregnant or trying to get pregnant Breastfeeding How should I use this medication? This medication is injected into a vein or a muscle. It is given by your care team in a hospital or clinic setting. Talk to your care team about the use of this medication in children. Special care may be needed. Overdosage: If you think you have taken too much of this medicine contact a poison control center  or emergency room at once. NOTE: This medicine is only for you. Do not share this medicine with others. What if I miss a dose? Keep appointments for follow-up doses. It is important not to miss your dose. Call your care team if you are unable to keep an appointment. What may interact with this medication? Capecitabine Fluorouracil Phenobarbital Phenytoin Primidone Trimethoprim;sulfamethoxazole This list may not describe all possible interactions. Give your health care provider a list of all the medicines, herbs, non-prescription drugs, or dietary supplements you use. Also tell them if you smoke, drink alcohol, or  use illegal drugs. Some items may interact with your medicine. What should I watch for while using this medication? Your condition will be monitored carefully while you are receiving this medication. This medication may increase the side effects of 5-fluorouracil. Tell your care team if you have diarrhea or mouth sores that do not get better or that get worse. What side effects may I notice from receiving this medication? Side effects that you should report to your care team as soon as possible: Allergic reactions--skin rash, itching, hives, swelling of the face, lips, tongue, or throat This list may not describe all possible side effects. Call your doctor for medical advice about side effects. You may report side effects to FDA at 1-800-FDA-1088. Where should I keep my medication? This medication is given in a hospital or clinic. It will not be stored at home. NOTE: This sheet is a summary. It may not cover all possible information. If you have questions about this medicine, talk to your doctor, pharmacist, or health care provider.  2023 Elsevier/Gold Standard (2021-12-25 00:00:00)  Fluorouracil Injection What is this medication? FLUOROURACIL (flure oh YOOR a sil) treats some types of cancer. It works by slowing down the growth of cancer cells. This medicine may be used for other purposes; ask your health care provider or pharmacist if you have questions. COMMON BRAND NAME(S): Adrucil What should I tell my care team before I take this medication? They need to know if you have any of these conditions: Blood disorders Dihydropyrimidine dehydrogenase (DPD) deficiency Infection, such as chickenpox, cold sores, herpes Kidney disease Liver disease Poor nutrition Recent or ongoing radiation therapy An unusual or allergic reaction to fluorouracil, other medications, foods, dyes, or preservatives If you or your partner are pregnant or trying to get pregnant Breast-feeding How should I use this  medication? This medication is injected into a vein. It is administered by your care team in a hospital or clinic setting. Talk to your care team about the use of this medication in children. Special care may be needed. Overdosage: If you think you have taken too much of this medicine contact a poison control center or emergency room at once. NOTE: This medicine is only for you. Do not share this medicine with others. What if I miss a dose? Keep appointments for follow-up doses. It is important not to miss your dose. Call your care team if you are unable to keep an appointment. What may interact with this medication? Do not take this medication with any of the following: Live virus vaccines This medication may also interact with the following: Medications that treat or prevent blood clots, such as warfarin, enoxaparin, dalteparin This list may not describe all possible interactions. Give your health care provider a list of all the medicines, herbs, non-prescription drugs, or dietary supplements you use. Also tell them if you smoke, drink alcohol, or use illegal drugs. Some items may interact with your  medicine. What should I watch for while using this medication? Your condition will be monitored carefully while you are receiving this medication. This medication may make you feel generally unwell. This is not uncommon as chemotherapy can affect healthy cells as well as cancer cells. Report any side effects. Continue your course of treatment even though you feel ill unless your care team tells you to stop. In some cases, you may be given additional medications to help with side effects. Follow all directions for their use. This medication may increase your risk of getting an infection. Call your care team for advice if you get a fever, chills, sore throat, or other symptoms of a cold or flu. Do not treat yourself. Try to avoid being around people who are sick. This medication may increase your risk  to bruise or bleed. Call your care team if you notice any unusual bleeding. Be careful brushing or flossing your teeth or using a toothpick because you may get an infection or bleed more easily. If you have any dental work done, tell your dentist you are receiving this medication. Avoid taking medications that contain aspirin, acetaminophen, ibuprofen, naproxen, or ketoprofen unless instructed by your care team. These medications may hide a fever. Do not treat diarrhea with over the counter products. Contact your care team if you have diarrhea that lasts more than 2 days or if it is severe and watery. This medication can make you more sensitive to the sun. Keep out of the sun. If you cannot avoid being in the sun, wear protective clothing and sunscreen. Do not use sun lamps, tanning beds, or tanning booths. Talk to your care team if you or your partner wish to become pregnant or think you might be pregnant. This medication can cause serious birth defects if taken during pregnancy and for 3 months after the last dose. A reliable form of contraception is recommended while taking this medication and for 3 months after the last dose. Talk to your care team about effective forms of contraception. Do not father a child while taking this medication and for 3 months after the last dose. Use a condom while having sex during this time period. Do not breastfeed while taking this medication. This medication may cause infertility. Talk to your care team if you are concerned about your fertility. What side effects may I notice from receiving this medication? Side effects that you should report to your care team as soon as possible: Allergic reactions--skin rash, itching, hives, swelling of the face, lips, tongue, or throat Heart attack--pain or tightness in the chest, shoulders, arms, or jaw, nausea, shortness of breath, cold or clammy skin, feeling faint or lightheaded Heart failure--shortness of breath, swelling of  the ankles, feet, or hands, sudden weight gain, unusual weakness or fatigue Heart rhythm changes--fast or irregular heartbeat, dizziness, feeling faint or lightheaded, chest pain, trouble breathing High ammonia level--unusual weakness or fatigue, confusion, loss of appetite, nausea, vomiting, seizures Infection--fever, chills, cough, sore throat, wounds that don't heal, pain or trouble when passing urine, general feeling of discomfort or being unwell Low red blood cell level--unusual weakness or fatigue, dizziness, headache, trouble breathing Pain, tingling, or numbness in the hands or feet, muscle weakness, change in vision, confusion or trouble speaking, loss of balance or coordination, trouble walking, seizures Redness, swelling, and blistering of the skin over hands and feet Severe or prolonged diarrhea Unusual bruising or bleeding Side effects that usually do not require medical attention (report to your care team if  they continue or are bothersome): Dry skin Headache Increased tears Nausea Pain, redness, or swelling with sores inside the mouth or throat Sensitivity to light Vomiting This list may not describe all possible side effects. Call your doctor for medical advice about side effects. You may report side effects to FDA at 1-800-FDA-1088. Where should I keep my medication? This medication is given in a hospital or clinic. It will not be stored at home. NOTE: This sheet is a summary. It may not cover all possible information. If you have questions about this medicine, talk to your doctor, pharmacist, or health care provider.  2023 Elsevier/Gold Standard (2021-11-20 00:00:00)  Octreotide Injection Solution What is this medication? OCTREOTIDE (ok TREE oh tide) treats high levels of growth hormone (acromegaly). It works by reducing the amount of growth hormone your body makes. This reduces symptoms and the risk of health problems caused by too much growth hormone, such as diabetes  and heart disease. It may also be used to treat diarrhea caused by neuroendocrine tumors. It works by slowing down the release of serotonin from the tumor cells. This reduces the number of bowel movements you have. This medicine may be used for other purposes; ask your health care provider or pharmacist if you have questions. COMMON BRAND NAME(S): Leatha Gilding, Sandostatin What should I tell my care team before I take this medication? They need to know if you have any of these conditions: Diabetes Gallbladder disease Kidney disease Liver disease Thyroid disease An unusual or allergic reaction to octreotide, other medications, foods, dyes, or preservatives Pregnant or trying to get pregnant Breast-feeding How should I use this medication? This medication is injected under the skin or into a vein. It is usually given by your care team in a hospital or clinic setting. If you get this medication at home, you will be taught how to prepare and give it. Use exactly as directed. Take it as directed on the prescription label at the same time every day. Keep taking it unless your care team tells you to stop. Allow the injection solution to come to room temperature before use. Do not warm it artificially. It is important that you put your used needles and syringes in a special sharps container. Do not put them in a trash can. If you do not have a sharps container, call your pharmacist or care team to get one. Talk to your care team about the use of this medication in children. Special care may be needed. Overdosage: If you think you have taken too much of this medicine contact a poison control center or emergency room at once. NOTE: This medicine is only for you. Do not share this medicine with others. What if I miss a dose? If you miss a dose, take it as soon as you can. If it is almost time for your next dose, take only that dose. Do not take double or extra doses. What may interact with this  medication? Bromocriptine Certain medications for blood pressure, heart disease, irregular heartbeat Cyclosporine Diuretics Medications for diabetes, including insulin Quinidine This list may not describe all possible interactions. Give your health care provider a list of all the medicines, herbs, non-prescription drugs, or dietary supplements you use. Also tell them if you smoke, drink alcohol, or use illegal drugs. Some items may interact with your medicine. What should I watch for while using this medication? Visit your care team for regular checks on your progress. Tell your care team if your symptoms do not start  to get better or if they get worse. To help reduce irritation at the injection site, use a different site for each injection and make sure the solution is at room temperature before use. This medication may cause decreases in blood sugar. Signs of low blood sugar include chills, cool, pale skin or cold sweats, drowsiness, extreme hunger, fast heartbeat, headache, nausea, nervousness or anxiety, shakiness, trembling, unsteadiness, tiredness, or weakness. Contact your care team right away if you experience any of these symptoms. This medication may increase blood sugar. The risk may be higher in patients who already have diabetes. Ask your care team what you can do to lower your risk of diabetes while taking this medication. You should make sure you get enough vitamin B12 while you are taking this medication. Discuss the foods you eat and the vitamins you take with your care team. What side effects may I notice from receiving this medication? Side effects that you should report to your care team as soon as possible: Allergic reactions--skin rash, itching, hives, swelling of the face, lips, tongue, or throat Gallbladder problems--severe stomach pain, nausea, vomiting, fever Heart rhythm changes--fast or irregular heartbeat, dizziness, feeling faint or lightheaded, chest pain, trouble  breathing High blood sugar (hyperglycemia)--increased thirst or amount of urine, unusual weakness or fatigue, blurry vision Low blood sugar (hypoglycemia)--tremors or shaking, anxiety, sweating, cold or clammy skin, confusion, dizziness, rapid heartbeat Low thyroid levels (hypothyroidism)--unusual weakness or fatigue, increased sensitivity to cold, constipation, hair loss, dry skin, weight gain, feelings of depression Low vitamin B12 level--pain, tingling, or numbness in the hands or feet, muscle weakness, dizziness, confusion, trouble concentrating Pancreatitis--severe stomach pain that spreads to your back or gets worse after eating or when touched, fever, nausea, vomiting Side effects that usually do not require medical attention (report to your care team if they continue or are bothersome): Diarrhea Dizziness Gas Headache Pain, redness, or irritation at injection site Stomach pain This list may not describe all possible side effects. Call your doctor for medical advice about side effects. You may report side effects to FDA at 1-800-FDA-1088. Where should I keep my medication? Keep out of the reach of children and pets. Store in the refrigerator. Protect from light. Allow to come to room temperature naturally. Do not use artificial heat. If protected from light, the injection may be stored between 20 and 30 degrees C (70 and 86 degrees F) for 14 days. After the initial use, throw away any unused portion of a multiple dose vial after 14 days. Get rid of any unused portions of the ampules after use. To get rid of medications that are no longer needed or have expired: Take the medication to a medication take-back program. Ask your pharmacy or law enforcement to find a location. If you cannot return the medication, ask your pharmacist or care team how to get rid of the medication safely. NOTE: This sheet is a summary. It may not cover all possible information. If you have questions about this  medicine, talk to your doctor, pharmacist, or health care provider.  2023 Elsevier/Gold Standard (2021-10-24 00:00:00)  The chemotherapy medication bag should finish at 46 hours, 96 hours, or 7 days. For example, if your pump is scheduled for 46 hours and it was put on at 4:00 p.m., it should finish at 2:00 p.m. the day it is scheduled to come off regardless of your appointment time.     Estimated time to finish at 11:00 a.m. on Thursday 08/22/2022.   If the display  on your pump reads "Low Volume" and it is beeping, take the batteries out of the pump and come to the cancer center for it to be taken off.   If the pump alarms go off prior to the pump reading "Low Volume" then call [phone] and someone can assist you.  If the plunger comes out and the chemotherapy medication is leaking out, please use your home chemo spill kit to clean up the spill. Do NOT use paper towels or other household products.  If you have problems or questions regarding your pump, please call either [phone] (24 hours a day) or the cancer center Monday-Friday 8:00 a.m.- 4:30 p.m. at the clinic number and we will assist you. If you are unable to get assistance, then go to the nearest Emergency Department and ask the staff to contact the IV team for assistance.

## 2022-08-20 NOTE — Telephone Encounter (Signed)
Message from Dr. Nahser/Dr. [name]:  [name], MD He was admitted last week with volume overload, did well with diuresis, bun and creatinine are up today , bp is low, on losartan and 20 mg demedex, due for cycle 2 chemo today, should we adjust his med? Thanks   9:08 AM PN Thayer Headings, MD I would have home hold demedex and losartan for a day ( to allow him to regain a bit more volume) and then restart . Would recheck it in a week   Pt is seeing APP on 08/27/22, notes updated to recheck BMET at that visit.

## 2022-08-20 NOTE — Progress Notes (Signed)
[place] OFFICE PROGRESS NOTE   Diagnosis: Pancreas neuroendocrine tumor  INTERVAL HISTORY:   Ronald Thompson was admitted last week with volume overload.  He feels much better after diuresis.  He continues Demadex.  His mouth feels dry.  No pain.  No neuropathy symptoms.  No diarrhea.  Objective:  Vital signs in last 24 hours:  Blood pressure (!) 94/57, pulse 94, temperature 98.1 F (36.7 C), temperature source Oral, resp. rate 18, height '5\' 10"'$  (1.778 m), weight 155 lb (70.3 kg), SpO2 97 %.    HEENT: No thrush or ulcers Resp: Lungs with coarse end inspiratory rhonchi at the right posterior base, no respiratory distress Cardio: Regular rate and rhythm GI: The liver is palpable throughout the upper abdomen extending to below the umbilicus and across the midline Vascular: Trace edema at the left greater than right lower leg  Skin: Palms without erythema, diminished skin turgor  Portacath/PICC-without erythema  Lab Results:  Lab Results  Component Value Date   WBC 8.4 08/20/2022   HGB 10.2 (L) 08/20/2022   HCT 30.5 (L) 08/20/2022   MCV 102.3 (H) 08/20/2022   PLT 197 08/20/2022   NEUTROABS 6.9 08/20/2022    CMP  Lab Results  Component Value Date   NA 136 08/20/2022   K 4.6 08/20/2022   CL 94 (L) 08/20/2022   CO2 34 (H) 08/20/2022   GLUCOSE 136 (H) 08/20/2022   BUN 56 (H) 08/20/2022   CREATININE 1.62 (H) 08/20/2022   CALCIUM 9.3 08/20/2022   PROT 6.6 08/20/2022   ALBUMIN 3.7 08/20/2022   AST 53 (H) 08/20/2022   ALT 24 08/20/2022   ALKPHOS 180 (H) 08/20/2022   BILITOT 1.2 08/20/2022   GFRNONAA 46 (L) 08/20/2022   GFRAA >60 01/19/2020    Medications: I have reviewed the patient's current medications.   Assessment/Plan:  Pancreatic neuroendocrine tumor, WHO grade 2, pancreatic head mass and small peripancreatic/celiac nodes on a CT 02/04/2014 EUS revealed evidence of multiple liver metastases-status post an FNA biopsy of a left liver lesion  02/10/2014 confirming a neuroendocrine tumor   Octreotide scan 04/01/2014 with multiple foci of metastatic neuroendocrine tumor in the liver Monthly Sandostatin started 03/16/2014 Restaging CT 07/04/2014 with resolution of a previously noted pancreas head lesion and probable progression of liver metastases Restaging CT 10/31/2014-stable liver lesions, no evidence of a pancreas mass, stable. Restaging CT 02/28/2015-stable hepatic metastases, stable pancreas lesion unchanged Restaging CT 08/30/2015-stable pancreas mass, slight enlargement of hepatic metastases Monthly Sandostatin continued Restaging CTs 01/17/2016 revealed enlargement of liver lesions, no new lesions, enlargement of the pancreas head mass Gallium DOTATATE scan 10/01/2016 confirmed uptake in the liver metastases, pancreas primary, peripancreatic adenopathy, and left iliac bone Cycle 1 Lutathera 03/05/2017 Cycle 2 Lutathera 04/30/2017 Cycle 3 Lutathera 07/02/2017 Cycle 4 Lutathera 08/27/2017 Netspot scan 09/29/2017-decreased radiotracer activity within the pancreas head, peripancreatic lymph node, and solitary skeletal lesion.  Liver lesions have increased in size with a decrease in radiotracer activity Monthly Sandostatin continued Netspot scan 05/20/2018-stable to decreased size of liver lesions, most have decreased SUV, similar uptake in the pancreas lesion, previously noted peripancreatic lymph node has resolved him a mild decrease in uptake associated with a left iliac metastasis, no evidence of disease progression Monthly Sandostatin continued Netspot scan 01/29/2019- overall stable to improved, 1 lesion left hepatic lobe has increased tracer activity-unchanged in size, majority of hepatic lesions have reduced activity compared to the pretreatment scan, decreased activity left iliac bone lesion, stable activity in the head of the  pancreas lesion, no new lesions Monthly Sandostatin continued Netspot on September 24, 2019-increase in  size of 3 liver lesions with associated stable radiotracer activity, no new lesions, stable small left iliac lesion Cycle 1 salvage Lutathera 10/27/2019 Cycle 2 salvage Lutathera 12/22/2019 Netspot on 02/15/2020-decrease in radiotracer activity in the liver metastases with a decrease in size of several lesions, no new lesions, no progressive disease in the bowel or mesentery, decreased activity in the left iliac bone lesion Monthly Sandostatin continued  Netspot 10/13/2020-decrease in radiotracer activity in the left and right liver, decreased size of liver lesions, one lesion in the superior right liver has increased in size and has no radiotracer activity, no evidence of metastatic disease outside the liver PET 11/15/2020- moderate hypermetabolic activity associated with several enlarging liver lesions that had limited activity on the dotatate PET, a lesion in the left lateral hepatic lobe with mild persistent dotatate activity has mild peripheral FDG activity and is decreased in size from a dotatate PET 1 year ago.  No hypermetabolic activity in the pancreas or bowel.  No hypermetabolic activity in the previous dotatate avid left iliac lesion Monthly Sandostatin continued Cycle 1 capecitabine/temozolomide 01/22/2021 (capecitabine twice daily days 1 through 14 of each 28-day cycle/temozolomide days 10 through 14) Cycle 2 capecitabine/temozolomide 02/19/2021 Cycle 3 capecitabine/temozolomide 03/19/2021 Cycle 4 capecitabine/temozolomide 04/17/2021 PET 05/11/2021-slight increase in size of several FDG avid liver lesions, no new hepatic lesions, no evidence of metastatic disease to the chest or bones Cycle 5 capecitabine/temozolomide 05/16/2021 Cycle 6 capecitabine/temozolomide 06/25/2021, capecitabine dose reduced Cycle 7 capecitabine/temozolomide 07/23/2021 Cycle 8 capecitabine/temozolomide 08/20/2021 PET 09/13/2021-mildly progressive hepatic metastases Everolimus 10/11/2021-discontinued after 2 weeks secondary  to cost Cabozantinib 12/13/2021 Cabozantinib discontinued 12/24/2021 Cabozantinib resumed at a reduced dose of 20 mg daily beginning 01/12/2022 PET 03/28/2022-multifocal tracer avid liver metastases, mild increase in size and able to mild increase in tracer uptake, no extrahepatic disease identified Cabozantinib continued at a dose of 20 mg daily PET 07/15/2022-slight interval increase in size of hepatic masses with slight decrease in metabolic activity slight increase in pelvic ascites, no new site of disease Cycle 1 FOLFOX 08/07/2022 Cycle 2 FOLFOX 08/20/2022    Chest Seminoma in 1990 treated with BEP and chest radiation at [facility] 3. 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 chronic exertional dyspnea following treatment for the seminoma   5. aortic stenosis on an echocardiogram December 2011, severe aortic stenosis on echocardiogram 12/06/2015, status post TAVR on 04/16/2016 6. lumbar disc surgery 2014   7. acute abdominal pain 02/04/2014-resolved   8.  Diabetes 9.  COVID-19 infection July 2022 10.  Admission 08/13/2022 with dyspnea-evidence of volume overload with an elevated BNP, improved with diuresis.  Dyspnea multifactorial-cardiac disease with mitral valve, pulmonic valve, and tricuspid valve regurgitation    Disposition: Ronald Thompson alerted for cycle of FOLFOX well.  He will complete cycle 2 today.  He appears to be volume depleted.  He will hold losartan and Demadex today and resume tomorrow.  He will return for a chemistry panel next week.  We will check the chromogranin a level when he returns next week. He understands the poor prognosis.  He reports meeting with his family and palliative care to discuss end-of-life issues.  He will return for an office visit and chemotherapy in 2 weeks.  He will return for a chemistry panel in 1  week.  Betsy Coder,  MD  08/20/2022  9:02 AM

## 2022-08-22 ENCOUNTER — Inpatient Hospital Stay: Payer: Medicare Other

## 2022-08-22 VITALS — BP 89/55 | HR 97 | Temp 97.5°F | Resp 18

## 2022-08-22 DIAGNOSIS — Z5111 Encounter for antineoplastic chemotherapy: Secondary | ICD-10-CM | POA: Diagnosis not present

## 2022-08-22 DIAGNOSIS — Z452 Encounter for adjustment and management of vascular access device: Secondary | ICD-10-CM | POA: Diagnosis not present

## 2022-08-22 DIAGNOSIS — C7B8 Other secondary neuroendocrine tumors: Secondary | ICD-10-CM | POA: Diagnosis not present

## 2022-08-22 DIAGNOSIS — C7A8 Other malignant neuroendocrine tumors: Secondary | ICD-10-CM | POA: Diagnosis not present

## 2022-08-22 DIAGNOSIS — D3A8 Other benign neuroendocrine tumors: Secondary | ICD-10-CM

## 2022-08-22 MED ORDER — SODIUM CHLORIDE 0.9% FLUSH
10.0000 mL | INTRAVENOUS | Status: DC | PRN
  Administered 2022-08-22: 10 mL

## 2022-08-22 MED ORDER — HEPARIN SOD (PORK) LOCK FLUSH 100 UNIT/ML IV SOLN
500.0000 [IU] | Freq: Once | INTRAVENOUS | Status: AC | PRN
  Administered 2022-08-22: 500 [IU]

## 2022-08-26 NOTE — Progress Notes (Signed)
Office Visit    Patient Name: Ronald Thompson Date of Encounter: 08/27/2022  PCP:  [name], MD   [place]  Cardiologist:  Mertie Moores, MD  Advanced Practice Provider:  No care team member to display Electrophysiologist:  None   HPI    Ronald Thompson is a 67 y.o. 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  He originally presented for evaluation of aortic stenosis.  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  Received high-dose chemotherapy and XRT.  Also accompanied some of his heart and extended down to the diaphragm.  2 years prior to his last appointment (06/18/2022) he was diagnosed with a neuroendocrine tumor likely to be pancreatic cancer.  He did have some mets to the liver as well.  He sees Dr. [name].   He had dyspnea for the past 25 years since the seminoma was treated.  Had significant DOE but was able to lift weights.  Does have DOE with walking up hills or climbing stairs.  Does okay on level ground.  Underwent TAVR April 16, 2016.  Overall done really well.  Checkup November 2021 showed that he was doing really well and the TAVR was functioning well.  No dyspnea.  Can walk several miles without difficulty.  November 2022 he was seen for follow-up.  No chest pain.  Chronic dyspnea from pulmonary fibrosis from the chemo.  Overall doing well from cardiac standpoint.  He was last seen November 2023.  He admitted that the cancer was taking its toll now with liver lesions that were growing.  Unable to exercise because of weakness and dyspnea.  He was seen in the hospital 08/13/2022 for acute on chronic diastolic heart failure.  His symptoms improved with Lasix.  Echocardiogram showed preserved EF,  flattened IV septum in systole, RV SF mildly reduced, severely elevated PASP, grade 3 atheroma (he could not aspirin if okay with oncologist)   Today, he remains euvolemic on Demadex.  His blood pressure remains soft at 109/73.  Overall feeling okay without any significant shortness of breath.  He does have a neuroendocrine tumor with metastasis to the liver.  He plans to follow-up with oncology and discuss a treatment plan.  Recent echocardiogram reviewed with the patient with moderate pulmonic regurgitation, severe tricuspid regurgitation, and moderate mitral valve stenosis.  He shares with me that overall prognosis is poor.  He continues to lose weight which she is worried about.  He is trying to eat enough calories to maintain body weight.  Reports no shortness of breath nor dyspnea on exertion. Reports no chest pain, pressure, or tightness. No edema, orthopnea, PND. Reports no palpitations.    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 25 yrs ago-during cancer tx.   Past Surgical  History:  Procedure Laterality Date   BACK SURGERY     '14- rupt. disc    CARDIAC CATHETERIZATION N/A 01/24/2016   Procedure: Right/Left Heart Cath and Coronary Angiography;  Surgeon: [name], MD;  Location: [place] CV LAB;  Service: Cardiovascular;  Laterality: N/A;   COLONOSCOPY     EUS N/A 02/10/2014   Procedure: UPPER ENDOSCOPIC ULTRASOUND (EUS) LINEAR;  Surgeon: [name], MD;  Location: [facility] ENDOSCOPY;  Service: Endoscopy;  Laterality: N/A;   IR IMAGING GUIDED PORT INSERTION  08/02/2022   IR RADIOLOGIST EVAL & MGMT  02/11/2017   KNEE SURGERY Right    age 19 for Baker's cyst   NASAL SEPTUM SURGERY     Deviated septrum   TEE WITHOUT CARDIOVERSION N/A 04/16/2016   Procedure: TRANSESOPHAGEAL ECHOCARDIOGRAM (TEE);  Surgeon: [name], MD;  Location: [facility];  Service: Open Heart Surgery;  Laterality: N/A;   THORACOTOMY  1990   wedge biopsy of mediastinal mass   TRANSCATHETER AORTIC VALVE REPLACEMENT, TRANSFEMORAL N/A 04/16/2016   Procedure: TRANSCATHETER AORTIC VALVE REPLACEMENT, TRANSFEMORAL;  Surgeon: [name], MD;  Location: [facility];  Service: Open Heart Surgery;  Laterality: N/A;    Allergies  Allergies  Allergen Reactions   Penicillins Hives    ENTIRE BODY Has patient had a PCN reaction causing immediate rash, facial/tongue/throat swelling, SOB or lightheadedness with hypotension: No Has patient had a PCN reaction causing severe rash involving mucus membranes or skin necrosis: No Has patient had a PCN reaction that required hospitalization * *  YES  * * Has patient had a PCN reaction occurring within the last 10 years: No If all of the above answers are "NO", then may proceed with Cephalosporin use. *reaction occurred when he was 17    EKGs/Labs/Other Studies Reviewed:   The following studies were reviewed today:  Echo 08/13/2022 IMPRESSIONS     1. Left ventricular ejection fraction, by estimation, is 65 to 70%. The  left ventricle has normal function.  The left ventricle has no regional  wall motion abnormalities. There is moderate concentric left ventricular  hypertrophy. Left ventricular  diastolic parameters are indeterminate. There is the interventricular  septum is flattened in systole, consistent with right ventricular pressure  overload.   2. Right ventricular systolic function is mildly reduced. The right  ventricular size is severely enlarged. There is severely elevated  pulmonary artery systolic pressure. The estimated right ventricular  systolic pressure is 00.8 mmHg.   3. Right atrial size was mild to moderately dilated.   4. The mitral valve is degenerative. Mild mitral valve regurgitation.  Severe mitral stenosis. The mean mitral valve gradient is 10.0 mmHg with  average heart rate of 89 bpm. Severe mitral annular calcification.   5. Tricuspid valve regurgitation is severe.   6. The aortic valve has been repaired/replaced. Aortic valve  regurgitation is trivial, valvular and paravalvular. There is a 26 mm  Sapien prosthetic (TAVR) valve present in the aortic position. Procedure  Date: 04/16/2016. Aortic  valve area, by VTI  measures 3.10 cm. Aortic valve mean gradient measures 5.2 mmHg. Aortic  valve Vmax measures 1.60 m/s.   7. Pulmonic valve regurgitation is moderate.   8. There is Moderate (Grade III) atheroma plaque involving the ascending  aorta.   9. The inferior vena cava is normal in size with <50% respiratory  variability, suggesting right atrial pressure of 8 mmHg.     EKG:  EKG is not ordered today.    Recent Labs: 08/13/2022: B Natriuretic Peptide 1,164.3; TSH 1.980 08/15/2022: Magnesium 2.0 08/20/2022: ALT 24; BUN 56; Creatinine 1.62; Hemoglobin 10.2; Platelet Count 197; Potassium 4.6; Sodium 136  Recent Lipid Panel    Component Value Date/Time   CHOL 135 05/09/2022 0857   TRIG 71.0 05/09/2022 0857   HDL 40.60 05/09/2022 0857   CHOLHDL 3 05/09/2022 0857   VLDL 14.2 05/09/2022 0857   LDLCALC 80  05/09/2022 0857   LDLDIRECT 139.2 10/10/2008 0800    Home Medications   Current Meds  Medication Sig   acetaminophen (TYLENOL) 500 MG tablet Take 2 tablets (1,000 mg total) by mouth every 8 (eight) hours as needed for headache.   albuterol (VENTOLIN HFA) 108 (90 Base) MCG/ACT inhaler Inhale 1-2 puffs into the lungs every 6 (six) hours as needed for wheezing or shortness of breath.   Aromatic Inhalants (VICKS VAPOR INHALER IN) Inhale 1 spray into the lungs daily as needed (congestion).   Cholecalciferol (VITAMIN D-3) 125 MCG (5000 UT) TABS Take 1 tablet by mouth daily in the afternoon.   diphenhydrAMINE (BENADRYL) 25 MG tablet Take 25-50 mg by mouth at bedtime.   Lancet Device MISC Use as instructed to test blood sugar daily E11.65   Lancets (ONETOUCH DELICA PLUS [id number]) MISC TEST ONCE DAILY   lidocaine-prilocaine (EMLA) cream Apply to portacath site 1 hour prior to use   loperamide (IMODIUM) 2 MG capsule Take 2 mg by mouth as needed for diarrhea or loose stools.   losartan (COZAAR) 100 MG tablet Take 1 tablet (100 mg total) by mouth daily.   prochlorperazine (COMPAZINE) 10 MG tablet Take 1 tablet (10 mg total) by mouth every 6 (six) hours as needed for nausea or vomiting.   simvastatin (ZOCOR) 20 MG tablet TAKE ONE TABLET BY MOUTH DAILY AT 8PM (Patient taking differently: Take 20 mg by mouth daily at 6 PM. TAKE ONE TABLET BY MOUTH DAILY AT 8PM)   tamsulosin (FLOMAX) 0.4 MG CAPS capsule Take 1 capsule (0.4 mg total) by mouth daily.   torsemide (DEMADEX) 20 MG tablet Take 1 tablet (20 mg total) by mouth daily. (Patient taking differently: Take 20-40 mg by mouth daily. Takes 20 mg daily and if weight goes up he will take 40 mg daily.)     Review of Systems      All other systems reviewed and are otherwise negative except as noted above.  Physical Exam    VS:  BP 109/73   Pulse 88   Ht '5\' 10"'$  (1.778 m)   Wt 149 lb (67.6 kg)   SpO2 97%   BMI 21.38 kg/m  , BMI Body mass index is  21.38 kg/m.  Wt Readings from Last 3 Encounters:  08/27/22 149 lb (67.6 kg)  08/20/22 155 lb (70.3 kg)  08/19/22 150 lb (68 kg)     GEN: Well nourished, well developed, in no acute distress. HEENT: normal. Neck: Supple, no JVD, carotid bruits, or masses. Cardiac: RRR, diastolic murmur, rubs, or gallops. No clubbing, cyanosis, edema.  Radials/PT  2+ and equal bilaterally.  Respiratory:  Respirations regular and unlabored, clear to auscultation bilaterally. GI: Soft, nontender, nondistended. MS: No deformity or atrophy. Skin: Warm and dry, no rash. Neuro:  Strength and sensation are intact. Psych: Normal affect.  Assessment & Plan    Acute on chronic diastolic heart failure -Euvolemic on exam today -Continue Demadex -Continue monitoring weight daily -Continue holding losartan for liable blood pressures  AS status post TAVR 2017 -Recent echocardiogram 08/14/2022 reviewed -He has moderate pulmonic valve regurgitation and severe tricuspid valve regurgitation also mild mitral valve regurgitation and severe mitral stenosis -TAVR functioning well  3. Hypertension -Now low so holding losartan -continue diuretics  4. Metastatic neuroendocrine tumor chest seminoma 1990 treated with BMP and chest radiation at [facility] -continue follow-up with oncology -cycle 1 folfox 08/07/22       Disposition: Follow up 3 months with Mertie Moores, MD or APP.  Signed, Elgie Collard, PA-C 08/27/2022, 12:57 PM [facility]

## 2022-08-27 ENCOUNTER — Ambulatory Visit: Payer: Medicare Other | Attending: Physician Assistant | Admitting: Physician Assistant

## 2022-08-27 ENCOUNTER — Encounter: Payer: Self-pay | Admitting: Physician Assistant

## 2022-08-27 VITALS — BP 109/73 | HR 88 | Ht 70.0 in | Wt 149.0 lb

## 2022-08-27 DIAGNOSIS — Z952 Presence of prosthetic heart valve: Secondary | ICD-10-CM | POA: Insufficient documentation

## 2022-08-27 DIAGNOSIS — I350 Nonrheumatic aortic (valve) stenosis: Secondary | ICD-10-CM | POA: Insufficient documentation

## 2022-08-27 DIAGNOSIS — J8410 Pulmonary fibrosis, unspecified: Secondary | ICD-10-CM | POA: Insufficient documentation

## 2022-08-27 DIAGNOSIS — D3A8 Other benign neuroendocrine tumors: Secondary | ICD-10-CM | POA: Diagnosis not present

## 2022-08-27 DIAGNOSIS — I10 Essential (primary) hypertension: Secondary | ICD-10-CM | POA: Insufficient documentation

## 2022-08-27 DIAGNOSIS — E785 Hyperlipidemia, unspecified: Secondary | ICD-10-CM | POA: Insufficient documentation

## 2022-08-27 NOTE — Patient Instructions (Addendum)
Medication Instructions:  Continue to hold losartan until you see Dr Acie Fredrickson *If you need a refill on your cardiac medications before your next appointment, please call your pharmacy*   Lab Work: None If you have labs (blood work) drawn today and your tests are completely normal, you will receive your results only by: [place] (if you have MyChart) OR A paper copy in the mail If you have any lab test that is abnormal or we need to change your treatment, we will call you to review the results.   Follow-Up: At [facility], you and your health needs are our priority.  As part of our continuing mission to provide you with exceptional heart care, we have created designated Provider Care Teams.  These Care Teams include your primary Cardiologist (physician) and Advanced Practice Providers (APPs -  Physician Assistants and Nurse Practitioners) who all work together to provide you with the care you need, when you need it.   Your next appointment:   3 month(s)  Provider:   Mertie Moores, MD    Other Instructions 1.Weigh yourself every morning after using the restroom before breakfast and keep a log. 2.Check your blood pressure daily, one hour after taking your morning medications and let us know if the systolic (top number) is less than 100 or greater than 140

## 2022-08-28 ENCOUNTER — Telehealth: Payer: Self-pay | Admitting: Family Medicine

## 2022-08-28 ENCOUNTER — Other Ambulatory Visit: Payer: Self-pay

## 2022-08-28 NOTE — Telephone Encounter (Signed)
08/28/22 4:12 PM Call to follow up on recent hospitalization and increased symptom burden to see if pt wants a visit.  Pt states that he has follow up at the cancer center tomorrow, "I'm doing ok" and trying to get his affairs in order.  He is supposed to be getting chemo and is agreeable to follow up next week on Monday at 2 pm.  Damaris Hippo FNP-C

## 2022-08-29 ENCOUNTER — Inpatient Hospital Stay: Payer: Medicare Other

## 2022-08-29 DIAGNOSIS — Z452 Encounter for adjustment and management of vascular access device: Secondary | ICD-10-CM | POA: Diagnosis not present

## 2022-08-29 DIAGNOSIS — C7B8 Other secondary neuroendocrine tumors: Secondary | ICD-10-CM | POA: Diagnosis not present

## 2022-08-29 DIAGNOSIS — C7A8 Other malignant neuroendocrine tumors: Secondary | ICD-10-CM | POA: Diagnosis not present

## 2022-08-29 DIAGNOSIS — Z5111 Encounter for antineoplastic chemotherapy: Secondary | ICD-10-CM | POA: Diagnosis not present

## 2022-08-29 DIAGNOSIS — D3A8 Other benign neuroendocrine tumors: Secondary | ICD-10-CM

## 2022-08-29 LAB — BASIC METABOLIC PANEL - CANCER CENTER ONLY
Anion gap: 10 (ref 5–15)
BUN: 41 mg/dL — ABNORMAL HIGH (ref 8–23)
CO2: 33 mmol/L — ABNORMAL HIGH (ref 22–32)
Calcium: 9.4 mg/dL (ref 8.9–10.3)
Chloride: 96 mmol/L — ABNORMAL LOW (ref 98–111)
Creatinine: 1.28 mg/dL — ABNORMAL HIGH (ref 0.61–1.24)
GFR, Estimated: >60 mL/min (ref >60)
Glucose, Bld: 146 mg/dL — ABNORMAL HIGH (ref 70–99)
Potassium: 4.2 mmol/L (ref 3.5–5.1)
Sodium: 139 mmol/L (ref 135–145)

## 2022-08-29 LAB — MAGNESIUM: Magnesium: 2 mg/dL (ref 1.7–2.4)

## 2022-08-29 MED ORDER — SODIUM CHLORIDE 0.9% FLUSH
10.0000 mL | Freq: Once | INTRAVENOUS | Status: AC
  Administered 2022-08-29: 10 mL via INTRAVENOUS

## 2022-08-29 MED ORDER — HEPARIN SOD (PORK) LOCK FLUSH 100 UNIT/ML IV SOLN
500.0000 [IU] | Freq: Once | INTRAVENOUS | Status: AC
  Administered 2022-08-29: 500 [IU] via INTRAVENOUS

## 2022-08-29 NOTE — Patient Instructions (Signed)

## 2022-09-01 ENCOUNTER — Other Ambulatory Visit: Payer: Self-pay | Admitting: Oncology

## 2022-09-01 DIAGNOSIS — D3A8 Other benign neuroendocrine tumors: Secondary | ICD-10-CM

## 2022-09-02 ENCOUNTER — Encounter: Payer: Medicare Other | Admitting: Family Medicine

## 2022-09-02 ENCOUNTER — Non-Acute Institutional Stay: Payer: PRIVATE HEALTH INSURANCE | Admitting: Family Medicine

## 2022-09-02 ENCOUNTER — Encounter: Payer: Self-pay | Admitting: Family Medicine

## 2022-09-02 VITALS — BP 126/80 | HR 92 | Temp 97.7°F | Resp 16 | Wt 145.0 lb

## 2022-09-02 DIAGNOSIS — Z515 Encounter for palliative care: Secondary | ICD-10-CM

## 2022-09-02 DIAGNOSIS — D3A8 Other benign neuroendocrine tumors: Secondary | ICD-10-CM

## 2022-09-02 DIAGNOSIS — G4700 Insomnia, unspecified: Secondary | ICD-10-CM | POA: Diagnosis not present

## 2022-09-02 DIAGNOSIS — I10 Essential (primary) hypertension: Secondary | ICD-10-CM | POA: Diagnosis not present

## 2022-09-02 NOTE — Progress Notes (Signed)
[facility] Palliative Care Consult Note Telephone: [phone]  Fax: [phone]    Date of encounter: 09/02/22 2:58 PM PATIENT NAME: Ronald Thompson [address] [place] [address]   [phone] (home)  DOB: [birth year] MRN: [id number] PRIMARY CARE PROVIDER:    [name], MD,  [place] [address] [phone]  REFERRING PROVIDER:   [name], MD [address] [place],  [place] [address] [phone]  RESPONSIBLE PARTY:    Contact Information     Name Relation Home Work Mobile   Pearlman,Anne Spouse [phone]  [phone]   Paulino, [place]   [phone]   Marylynn Pearson   [phone]        I met face to face with patient and family in [place]. Palliative Care was asked to follow this patient by consultation request of  [name], MD to address advance care planning and complex medical decision making. This is a follow up visit   ASSESSMENT , SYMPTOM MANAGEMENT AND PLAN / RECOMMENDATIONS:   Metastatic primary neuroendocrine tumor of the pancreas Stable, tolerating Folfox at present. Due for 3rd treatment tomorrow. Has home care agency helping with personal care and light housekeeping to help with energy conservation.   Primary HTN BP too low per Cardiology and recommended holding Losartan.  3.  Insomnia Some symptoms consistent with Parkinsonian symptoms (tremor, shuffling gait, small handwriting and restless legs) Sent message to Dr Betsy Coder Oncologist asking if he thought that Requip might help both RLS and sleep vs another option of low dose benzodiazepine at HS. Offered to write Rx if needed.   4.    Palliative Care Encounter Reviewed MOST form and discussed likely challenges. Completed MOST, uploaded form to [facility], original to pt and copy sent to pt's son/HC POA-Ed Aguilar Jr.  Advance Care Planning/Goals of Care: Goals  include to maximize quality of life and symptom management. Patien gave his permission to discuss. Our advance care planning conversation included a discussion about:    The value and importance of advance care planning-son Ed Pascoe JR is his  [facility] POA Experiences with loved ones who have been seriously ill or have died- his father had issues with aspiration due to his Parkinson's disease, had a feeding tube and continued to aspirate as well as being on a ventilator Exploration of personal, cultural or spiritual beliefs that might influence medical decisions-has a strong Catholic faith and states his religion does not prohibit him from declining life prolonging treatments.  He watched his father suffer on a ventilator and aspirate despite a feeding tube within 3 hours of placement.  He wants to see "Dian Queen do better.  I want to go to the beach in April if I feel good, I just want to see it one more time.  I want to make it to the election). Exploration of goals of care in the event of a sudden injury or illness-does not want to be in the hospital but also does not want to feel like he can't breathe Identification of a healthcare agent-son/HC POA-Ed Zappia Jr Review and creation of an advance directive document-MOST . Decision not to resuscitate or to de-escalate disease focused treatments due to poor prognosis. CODE STATUS: MOST completed today (09/02/22) and signed with patient: DNR/DNI/Comfort measures-does not want to be transported to the hospital unless it is the only way to make him comfortable Antibiotics and IV fluids on case by case  time limited basis No feeding tube  (55 minutes in goals of care discussion)   Follow up Palliative Care Visit: Palliative care will continue to follow for complex medical decision making, advance care planning, and clarification of goals. Return 2-3 weeks or prn.    This visit was coded based on medical decision making (MDM).  PPS: 50%  HOSPICE  ELIGIBILITY/DIAGNOSIS: TBD  Chief Complaint:  Palliative Care is continuing to follow patient for chronic medical management in setting of neuroendocrine cancer and to assist with advanced care planning, refining and defining goals of care.   HISTORY OF PRESENT ILLNESS:  Ronald Thompson is a 67 y.o. year old male with metastatic neuroendocrine tumor of the pancreas with liver mets currently on Folfox chemotherapy.  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  He has significant valvular heart disease affecting mitral and aortic valves as well as severe pulmonary hypertension which are contributing to heart failure.  He has diabetes which is diet controlled and CKD stage 3.  He had a recent pneumonia and heart failure with swelling in his feet.  His mitral valve is deteriorating.  The last couple of days have been better and will take 3rd treatment tomorrow.  He is off Losartan currently.  He is making funeral arrangements.  He has been told .  He has restless legs, tremors, shuffling gait with family hx of Parkinson's disease.  Has home help through [facility].  History obtained from review of EMR, discussion with family, facility staff and/or Mr. Holquin.      Latest Ref Rng & Units 08/20/2022    8:20 AM 08/13/2022   11:15 PM 08/13/2022    3:46 PM  CBC  WBC 4.0 - 10.5 K/uL 8.4  5.2  5.5   Hemoglobin 13.0 - 17.0 g/dL 10.2  10.2  9.8   Hematocrit 39.0 - 52.0 % 30.5  30.0  30.1   Platelets 150 - 400 K/uL 197  121  120       Latest Ref Rng & Units 08/29/2022    8:28 AM 08/20/2022    8:20 AM 08/15/2022    7:37 AM  CMP  Glucose 70 - 99 mg/dL 146  136  99   BUN 8 - 23 mg/dL 41  56  24   Creatinine 0.61 - 1.24 mg/dL 1.28  1.62  1.24   Sodium 135 - 145 mmol/L 139  136  137   Potassium 3.5 - 5.1 mmol/L 4.2  4.6  3.9   Chloride 98 - 111 mmol/L 96  94  95   CO2 22 - 32 mmol/L 33  34  30   Calcium 8.9 - 10.3 mg/dL 9.4  9.3  8.9    Total Protein 6.5 - 8.1 g/dL  6.6    Total Bilirubin 0.3 - 1.2 mg/dL  1.2    Alkaline Phos 38 - 126 U/L  180    AST 15 - 41 U/L  53    ALT 0 - 44 U/L  24         Latest Ref Rng & Units 08/20/2022    8:20 AM 08/13/2022   11:15 PM 08/13/2022    3:46 PM  Hepatic Function  Total Protein 6.5 - 8.1 g/dL 6.6  6.3  6.2   Albumin 3.5 - 5.0 g/dL 3.7  3.3  3.6   AST 15 - 41 U/L 53  37  29   ALT  0 - 44 U/L '24  24  20   '$ Alk Phosphatase 38 - 126 U/L 180  145  128   Total Bilirubin 0.3 - 1.2 mg/dL 1.2  1.8  1.5      I reviewed EMR for available labs, medications, imaging, studies and related documents.  Records reviewed and summarized above.   ROS General: NAD EYES: denies vision changes ENMT: Occasional dysphagia Cardiovascular: denies chest pain, DOE Pulmonary: denies cough, denies increased SOB Abdomen: endorses waxing/waning appetite but eating better, endorses recent loose stools and helped with Imodium, endorses continence of bowel GU: denies dysuria, Urinates hourly, endorses continence of urine MSK:  denies increased weakness currently,  no new falls reported Skin: denies rashes or wounds Neurological: endorses pain in ribs at night and when getting up, endorses insomnia Psych: Endorses positive mood Heme/lymph/immuno: denies bruises, abnormal bleeding  Physical Exam: Current and past weights: 146 lbs today Constitutional: NAD General:thin/WD ENMT: intact hearing CV: S1S2, RRR with occasional premature beat, trace BLE edema to lower third of shin Pulmonary: CTA, no increased work of breathing, no cough, room air Abdomen: normo-active BS + 4 quadrants, soft/mildly tender to palpation and distended GU: deferred MSK: no sarcopenia, moves all extremities, ambulatory with rollator Skin: warm and dry, no rashes or wounds on visible skin Neuro:  no generalized weakness,  no cognitive impairment Psych: non-anxious affect, A and O x 3 Hem/lymph/immuno: no widespread bruising   Thank  you for the opportunity to participate in the care of Mr. Pavone.  The palliative care team will continue to follow. Please call our office at [phone] if we can be of additional assistance.   Marijo Conception, FNP -C  COVID-19 PATIENT SCREENING TOOL Asked and negative response unless otherwise noted:   Have you had symptoms of covid, tested positive or been in contact with someone with symptoms/positive test in the past 5-10 days?  No

## 2022-09-03 ENCOUNTER — Inpatient Hospital Stay: Payer: Medicare Other

## 2022-09-03 ENCOUNTER — Other Ambulatory Visit: Payer: Self-pay | Admitting: *Deleted

## 2022-09-03 ENCOUNTER — Inpatient Hospital Stay (HOSPITAL_BASED_OUTPATIENT_CLINIC_OR_DEPARTMENT_OTHER): Payer: Medicare Other | Admitting: Oncology

## 2022-09-03 VITALS — BP 115/60 | HR 88 | Temp 98.1°F | Resp 18 | Ht 70.0 in | Wt 146.6 lb

## 2022-09-03 DIAGNOSIS — C7A8 Other malignant neuroendocrine tumors: Secondary | ICD-10-CM | POA: Diagnosis not present

## 2022-09-03 DIAGNOSIS — C7B8 Other secondary neuroendocrine tumors: Secondary | ICD-10-CM | POA: Diagnosis not present

## 2022-09-03 DIAGNOSIS — Z5111 Encounter for antineoplastic chemotherapy: Secondary | ICD-10-CM | POA: Diagnosis not present

## 2022-09-03 DIAGNOSIS — D3A8 Other benign neuroendocrine tumors: Secondary | ICD-10-CM | POA: Diagnosis not present

## 2022-09-03 DIAGNOSIS — Z452 Encounter for adjustment and management of vascular access device: Secondary | ICD-10-CM | POA: Diagnosis not present

## 2022-09-03 LAB — CMP (CANCER CENTER ONLY)
ALT: 18 U/L (ref 0–44)
AST: 28 U/L (ref 15–41)
Albumin: 4.1 g/dL (ref 3.5–5.0)
Alkaline Phosphatase: 161 U/L — ABNORMAL HIGH (ref 38–126)
Anion gap: 11 (ref 5–15)
BUN: 42 mg/dL — ABNORMAL HIGH (ref 8–23)
CO2: 33 mmol/L — ABNORMAL HIGH (ref 22–32)
Calcium: 9.6 mg/dL (ref 8.9–10.3)
Chloride: 97 mmol/L — ABNORMAL LOW (ref 98–111)
Creatinine: 1.34 mg/dL — ABNORMAL HIGH (ref 0.61–1.24)
GFR, Estimated: 58 mL/min — ABNORMAL LOW (ref >60)
Glucose, Bld: 191 mg/dL — ABNORMAL HIGH (ref 70–99)
Potassium: 3.6 mmol/L (ref 3.5–5.1)
Sodium: 141 mmol/L (ref 135–145)
Total Bilirubin: 1.5 mg/dL — ABNORMAL HIGH (ref 0.3–1.2)
Total Protein: 6.9 g/dL (ref 6.5–8.1)

## 2022-09-03 LAB — CBC WITH DIFFERENTIAL (CANCER CENTER ONLY)
Abs Immature Granulocytes: 0.01 10*3/uL (ref 0.00–0.07)
Basophils Absolute: 0 10*3/uL (ref 0.0–0.1)
Basophils Relative: 1 %
Eosinophils Absolute: 0.1 10*3/uL (ref 0.0–0.5)
Eosinophils Relative: 2 %
HCT: 31.5 % — ABNORMAL LOW (ref 39.0–52.0)
Hemoglobin: 10.5 g/dL — ABNORMAL LOW (ref 13.0–17.0)
Immature Granulocytes: 0 %
Lymphocytes Relative: 5 %
Lymphs Abs: 0.3 10*3/uL — ABNORMAL LOW (ref 0.7–4.0)
MCH: 33.8 pg (ref 26.0–34.0)
MCHC: 33.3 g/dL (ref 30.0–36.0)
MCV: 101.3 fL — ABNORMAL HIGH (ref 80.0–100.0)
Monocytes Absolute: 0.8 10*3/uL (ref 0.1–1.0)
Monocytes Relative: 12 %
Neutro Abs: 5 10*3/uL (ref 1.7–7.7)
Neutrophils Relative %: 80 %
Platelet Count: 135 10*3/uL — ABNORMAL LOW (ref 150–400)
RBC: 3.11 MIL/uL — ABNORMAL LOW (ref 4.22–5.81)
RDW: 15.3 % (ref 11.5–15.5)
WBC Count: 6.3 10*3/uL (ref 4.0–10.5)
nRBC: 0 % (ref 0.0–0.2)

## 2022-09-03 MED ORDER — LORAZEPAM 0.5 MG PO TABS: 0.5000 mg | ORAL_TABLET | Freq: Every evening | ORAL | 0 refills | Status: DC | PRN

## 2022-09-03 MED ORDER — TRAMADOL HCL 50 MG PO TABS: 50.0000 mg | ORAL_TABLET | Freq: Four times a day (QID) | ORAL | 0 refills | Status: DC | PRN

## 2022-09-03 MED ORDER — SODIUM CHLORIDE 0.9 % IV SOLN
10.0000 mg | Freq: Once | INTRAVENOUS | Status: AC
  Administered 2022-09-03: 10 mg via INTRAVENOUS
  Filled 2022-09-03: qty 10

## 2022-09-03 MED ORDER — OXALIPLATIN CHEMO INJECTION 100 MG/20ML
65.0000 mg/m2 | Freq: Once | INTRAVENOUS | Status: AC
  Administered 2022-09-03: 125 mg via INTRAVENOUS
  Filled 2022-09-03: qty 20

## 2022-09-03 MED ORDER — PALONOSETRON HCL INJECTION 0.25 MG/5ML
0.2500 mg | Freq: Once | INTRAVENOUS | Status: AC
  Administered 2022-09-03: 0.25 mg via INTRAVENOUS
  Filled 2022-09-03: qty 5

## 2022-09-03 MED ORDER — SODIUM CHLORIDE 0.9 % IV SOLN
2000.0000 mg/m2 | INTRAVENOUS | Status: DC
  Administered 2022-09-03: 3800 mg via INTRAVENOUS
  Filled 2022-09-03: qty 76

## 2022-09-03 MED ORDER — LEUCOVORIN CALCIUM INJECTION 350 MG
300.0000 mg/m2 | Freq: Once | INTRAVENOUS | Status: AC
  Administered 2022-09-03: 568 mg via INTRAVENOUS
  Filled 2022-09-03: qty 28.4

## 2022-09-03 MED ORDER — FLUOROURACIL CHEMO INJECTION 2.5 GM/50ML
300.0000 mg/m2 | Freq: Once | INTRAVENOUS | Status: AC
  Administered 2022-09-03: 550 mg via INTRAVENOUS
  Filled 2022-09-03: qty 11

## 2022-09-03 MED ORDER — DEXTROSE 5 % IV SOLN: Freq: Once | INTRAVENOUS | Status: AC

## 2022-09-03 NOTE — Progress Notes (Signed)
[place] Work  Clinical Social Work met with patient during infusion to  assess  for assessment of psychosocial needs.  Clinical Social Worker met with patient  to offer support and assess for needs.    Patient reported feeling fatigued today.  He informed CSW that he has an excellent support system.  He has completed his advance directives and funeral plans.  Two of his sons are lawyers and have assisted him with this.  Patient currently lives in an Assisted Living.  He expressed having all of his needs met.     Margaree Mackintosh, LCSW  Clinical Social Worker [facility]

## 2022-09-03 NOTE — Progress Notes (Signed)
Requested DNR today in office

## 2022-09-03 NOTE — Progress Notes (Signed)
Patient seen by Dr. Sherrill today ? ?Vitals are within treatment parameters. ? ?Labs reviewed by Dr. Sherrill and are within treatment parameters. ? ?Per physician team, patient is ready for treatment and there are NO modifications to the treatment plan.  ?

## 2022-09-03 NOTE — Patient Instructions (Signed)
Ronald Thompson   Discharge Instructions: Thank you for choosing [facility] to provide your oncology and hematology care.   If you have a lab appointment with the [facility], please go directly to the [facility] and check in at the registration area.   Wear comfortable clothing and clothing appropriate for easy access to any Portacath or PICC line.   We strive to give you quality time with your provider. You may need to reschedule your appointment if you arrive late (15 or more minutes).  Arriving late affects you and other patients whose appointments are after yours.  Also, if you miss three or more appointments without notifying the office, you may be dismissed from the clinic at the provider's discretion.      For prescription refill requests, have your pharmacy contact our office and allow 72 hours for refills to be completed.    Today you received the following chemotherapy and/or immunotherapy agents Oxaliplatin (ELOXATIN), Leucovorin & Flourouracil (ADRUCIL).      To help prevent nausea and vomiting after your treatment, we encourage you to take your nausea medication as directed.  BELOW ARE SYMPTOMS THAT SHOULD BE REPORTED IMMEDIATELY: *FEVER GREATER THAN 100.4 F (38 C) OR HIGHER *CHILLS OR SWEATING *NAUSEA AND VOMITING THAT IS NOT CONTROLLED WITH YOUR NAUSEA MEDICATION *UNUSUAL SHORTNESS OF BREATH *UNUSUAL BRUISING OR BLEEDING *URINARY PROBLEMS (pain or burning when urinating, or frequent urination) *BOWEL PROBLEMS (unusual diarrhea, constipation, pain near the anus) TENDERNESS IN MOUTH AND THROAT WITH OR WITHOUT PRESENCE OF ULCERS (sore throat, sores in mouth, or a toothache) UNUSUAL RASH, SWELLING OR PAIN  UNUSUAL VAGINAL DISCHARGE OR ITCHING   Items with * indicate a potential emergency and should be followed up as soon as possible or go to the Emergency Department if any problems should occur.  Please show the  CHEMOTHERAPY ALERT CARD or IMMUNOTHERAPY ALERT CARD at check-in to the Emergency Department and triage nurse.  Should you have questions after your visit or need to cancel or reschedule your appointment, please contact [facility]  Dept: [phone]  and follow the prompts.  Office hours are 8:00 a.m. to 4:30 p.m. Monday - Friday. Please note that voicemails left after 4:00 p.m. may not be returned until the following business day.  We are closed weekends and major holidays. You have access to a nurse at all times for urgent questions. Please call the main number to the clinic Dept: [phone] and follow the prompts.   For any non-urgent questions, you may also contact your provider using MyChart. We now offer e-Visits for anyone 52 and older to request care online for non-urgent symptoms. For details visit [email or web address].   Also download the MyChart app! Go to the app store, search "MyChart", open the app, select [facility], and log in with your MyChart username and password.  Oxaliplatin Injection What is this medication? OXALIPLATIN (ox AL i PLA tin) treats some types of cancer. It works by slowing down the growth of cancer cells. This medicine may be used for other purposes; ask your health care provider or pharmacist if you have questions. COMMON BRAND NAME(S): Eloxatin What should I tell my care team before I take this medication? They need to know if you have any of these conditions: Heart disease History of irregular heartbeat or rhythm Liver disease Low blood cell levels (white cells, red cells, and platelets) Lung or breathing disease, such as asthma Take medications  that treat or prevent blood clots Tingling of the fingers, toes, or other nerve disorder An unusual or allergic reaction to oxaliplatin, other medications, foods, dyes, or preservatives If you or your partner are pregnant or trying to get pregnant Breast-feeding How  should I use this medication? This medication is injected into a vein. It is given by your care team in a hospital or clinic setting. Talk to your care team about the use of this medication in children. Special care may be needed. Overdosage: If you think you have taken too much of this medicine contact a poison control center or emergency room at once. NOTE: This medicine is only for you. Do not share this medicine with others. What if I miss a dose? Keep appointments for follow-up doses. It is important not to miss a dose. Call your care team if you are unable to keep an appointment. What may interact with this medication? Do not take this medication with any of the following: Cisapride Dronedarone Pimozide Thioridazine This medication may also interact with the following: Aspirin and aspirin-like medications Certain medications that treat or prevent blood clots, such as warfarin, apixaban, dabigatran, and rivaroxaban Cisplatin Cyclosporine Diuretics Medications for infection, such as acyclovir, adefovir, amphotericin B, bacitracin, cidofovir, foscarnet, ganciclovir, gentamicin, pentamidine, vancomycin NSAIDs, medications for pain and inflammation, such as ibuprofen or naproxen Other medications that cause heart rhythm changes Pamidronate Zoledronic acid This list may not describe all possible interactions. Give your health care provider a list of all the medicines, herbs, non-prescription drugs, or dietary supplements you use. Also tell them if you smoke, drink alcohol, or use illegal drugs. Some items may interact with your medicine. What should I watch for while using this medication? Your condition will be monitored carefully while you are receiving this medication. You may need blood work while taking this medication. This medication may make you feel generally unwell. This is not uncommon as chemotherapy can affect healthy cells as well as cancer cells. Report any side effects.  Continue your course of treatment even though you feel ill unless your care team tells you to stop. This medication may increase your risk of getting an infection. Call your care team for advice if you get a fever, chills, sore throat, or other symptoms of a cold or flu. Do not treat yourself. Try to avoid being around people who are sick. Avoid taking medications that contain aspirin, acetaminophen, ibuprofen, naproxen, or ketoprofen unless instructed by your care team. These medications may hide a fever. Be careful brushing or flossing your teeth or using a toothpick because you may get an infection or bleed more easily. If you have any dental work done, tell your dentist you are receiving this medication. This medication can make you more sensitive to cold. Do not drink cold drinks or use ice. Cover exposed skin before coming in contact with cold temperatures or cold objects. When out in cold weather wear warm clothing and cover your mouth and nose to warm the air that goes into your lungs. Tell your care team if you get sensitive to the cold. Talk to your care team if you or your partner are pregnant or think either of you might be pregnant. This medication can cause serious birth defects if taken during pregnancy and for 9 months after the last dose. A negative pregnancy test is required before starting this medication. A reliable form of contraception is recommended while taking this medication and for 9 months after the last dose. Talk to  your care team about effective forms of contraception. Do not father a child while taking this medication and for 6 months after the last dose. Use a condom while having sex during this time period. Do not breastfeed while taking this medication and for 3 months after the last dose. This medication may cause infertility. Talk to your care team if you are concerned about your fertility. What side effects may I notice from receiving this medication? Side effects that  you should report to your care team as soon as possible: Allergic reactions--skin rash, itching, hives, swelling of the face, lips, tongue, or throat Bleeding--bloody or black, tar-like stools, vomiting blood or brown material that looks like coffee grounds, red or dark brown urine, small red or purple spots on skin, unusual bruising or bleeding Dry cough, shortness of breath or trouble breathing Heart rhythm changes--fast or irregular heartbeat, dizziness, feeling faint or lightheaded, chest pain, trouble breathing Infection--fever, chills, cough, sore throat, wounds that don't heal, pain or trouble when passing urine, general feeling of discomfort or being unwell Liver injury--right upper belly pain, loss of appetite, nausea, light-colored stool, dark yellow or brown urine, yellowing skin or eyes, unusual weakness or fatigue Low red blood cell level--unusual weakness or fatigue, dizziness, headache, trouble breathing Muscle injury--unusual weakness or fatigue, muscle pain, dark yellow or brown urine, decrease in amount of urine Pain, tingling, or numbness in the hands or feet Sudden and severe headache, confusion, change in vision, seizures, which may be signs of posterior reversible encephalopathy syndrome (PRES) Unusual bruising or bleeding Side effects that usually do not require medical attention (report to your care team if they continue or are bothersome): Diarrhea Nausea Pain, redness, or swelling with sores inside the mouth or throat Unusual weakness or fatigue Vomiting This list may not describe all possible side effects. Call your doctor for medical advice about side effects. You may report side effects to FDA at 1-800-FDA-1088. Where should I keep my medication? This medication is given in a hospital or clinic. It will not be stored at home. NOTE: This sheet is a summary. It may not cover all possible information. If you have questions about this medicine, talk to your doctor,  pharmacist, or health care provider.  2023 Elsevier/Gold Standard (2007-09-12 00:00:00)  Leucovorin Injection What is this medication? LEUCOVORIN (loo koe VOR in) prevents side effects from certain medications, such as methotrexate. It works by increasing folate levels. This helps protect healthy cells in your body. It may also be used to treat anemia caused by low levels of folate. It can also be used with fluorouracil, a type of chemotherapy, to treat colorectal cancer. It works by increasing the effects of fluorouracil in the body. This medicine may be used for other purposes; ask your health care provider or pharmacist if you have questions. What should I tell my care team before I take this medication? They need to know if you have any of these conditions: Anemia from low levels of vitamin B12 in the blood An unusual or allergic reaction to leucovorin, folic acid, other medications, foods, dyes, or preservatives Pregnant or trying to get pregnant Breastfeeding How should I use this medication? This medication is injected into a vein or a muscle. It is given by your care team in a hospital or clinic setting. Talk to your care team about the use of this medication in children. Special care may be needed. Overdosage: If you think you have taken too much of this medicine contact a poison  control center or emergency room at once. NOTE: This medicine is only for you. Do not share this medicine with others. What if I miss a dose? Keep appointments for follow-up doses. It is important not to miss your dose. Call your care team if you are unable to keep an appointment. What may interact with this medication? Capecitabine Fluorouracil Phenobarbital Phenytoin Primidone Trimethoprim;sulfamethoxazole This list may not describe all possible interactions. Give your health care provider a list of all the medicines, herbs, non-prescription drugs, or dietary supplements you use. Also tell them if you  smoke, drink alcohol, or use illegal drugs. Some items may interact with your medicine. What should I watch for while using this medication? Your condition will be monitored carefully while you are receiving this medication. This medication may increase the side effects of 5-fluorouracil. Tell your care team if you have diarrhea or mouth sores that do not get better or that get worse. What side effects may I notice from receiving this medication? Side effects that you should report to your care team as soon as possible: Allergic reactions--skin rash, itching, hives, swelling of the face, lips, tongue, or throat This list may not describe all possible side effects. Call your doctor for medical advice about side effects. You may report side effects to FDA at 1-800-FDA-1088. Where should I keep my medication? This medication is given in a hospital or clinic. It will not be stored at home. NOTE: This sheet is a summary. It may not cover all possible information. If you have questions about this medicine, talk to your doctor, pharmacist, or health care provider.  2023 Elsevier/Gold Standard (2021-12-25 00:00:00)  Fluorouracil Injection What is this medication? FLUOROURACIL (flure oh YOOR a sil) treats some types of cancer. It works by slowing down the growth of cancer cells. This medicine may be used for other purposes; ask your health care provider or pharmacist if you have questions. COMMON BRAND NAME(S): Adrucil What should I tell my care team before I take this medication? They need to know if you have any of these conditions: Blood disorders Dihydropyrimidine dehydrogenase (DPD) deficiency Infection, such as chickenpox, cold sores, herpes Kidney disease Liver disease Poor nutrition Recent or ongoing radiation therapy An unusual or allergic reaction to fluorouracil, other medications, foods, dyes, or preservatives If you or your partner are pregnant or trying to get  pregnant Breast-feeding How should I use this medication? This medication is injected into a vein. It is administered by your care team in a hospital or clinic setting. Talk to your care team about the use of this medication in children. Special care may be needed. Overdosage: If you think you have taken too much of this medicine contact a poison control center or emergency room at once. NOTE: This medicine is only for you. Do not share this medicine with others. What if I miss a dose? Keep appointments for follow-up doses. It is important not to miss your dose. Call your care team if you are unable to keep an appointment. What may interact with this medication? Do not take this medication with any of the following: Live virus vaccines This medication may also interact with the following: Medications that treat or prevent blood clots, such as warfarin, enoxaparin, dalteparin This list may not describe all possible interactions. Give your health care provider a list of all the medicines, herbs, non-prescription drugs, or dietary supplements you use. Also tell them if you smoke, drink alcohol, or use illegal drugs. Some items may interact  with your medicine. What should I watch for while using this medication? Your condition will be monitored carefully while you are receiving this medication. This medication may make you feel generally unwell. This is not uncommon as chemotherapy can affect healthy cells as well as cancer cells. Report any side effects. Continue your course of treatment even though you feel ill unless your care team tells you to stop. In some cases, you may be given additional medications to help with side effects. Follow all directions for their use. This medication may increase your risk of getting an infection. Call your care team for advice if you get a fever, chills, sore throat, or other symptoms of a cold or flu. Do not treat yourself. Try to avoid being around people who are  sick. This medication may increase your risk to bruise or bleed. Call your care team if you notice any unusual bleeding. Be careful brushing or flossing your teeth or using a toothpick because you may get an infection or bleed more easily. If you have any dental work done, tell your dentist you are receiving this medication. Avoid taking medications that contain aspirin, acetaminophen, ibuprofen, naproxen, or ketoprofen unless instructed by your care team. These medications may hide a fever. Do not treat diarrhea with over the counter products. Contact your care team if you have diarrhea that lasts more than 2 days or if it is severe and watery. This medication can make you more sensitive to the sun. Keep out of the sun. If you cannot avoid being in the sun, wear protective clothing and sunscreen. Do not use sun lamps, tanning beds, or tanning booths. Talk to your care team if you or your partner wish to become pregnant or think you might be pregnant. This medication can cause serious birth defects if taken during pregnancy and for 3 months after the last dose. A reliable form of contraception is recommended while taking this medication and for 3 months after the last dose. Talk to your care team about effective forms of contraception. Do not father a child while taking this medication and for 3 months after the last dose. Use a condom while having sex during this time period. Do not breastfeed while taking this medication. This medication may cause infertility. Talk to your care team if you are concerned about your fertility. What side effects may I notice from receiving this medication? Side effects that you should report to your care team as soon as possible: Allergic reactions--skin rash, itching, hives, swelling of the face, lips, tongue, or throat Heart attack--pain or tightness in the chest, shoulders, arms, or jaw, nausea, shortness of breath, cold or clammy skin, feeling faint or  lightheaded Heart failure--shortness of breath, swelling of the ankles, feet, or hands, sudden weight gain, unusual weakness or fatigue Heart rhythm changes--fast or irregular heartbeat, dizziness, feeling faint or lightheaded, chest pain, trouble breathing High ammonia level--unusual weakness or fatigue, confusion, loss of appetite, nausea, vomiting, seizures Infection--fever, chills, cough, sore throat, wounds that don't heal, pain or trouble when passing urine, general feeling of discomfort or being unwell Low red blood cell level--unusual weakness or fatigue, dizziness, headache, trouble breathing Pain, tingling, or numbness in the hands or feet, muscle weakness, change in vision, confusion or trouble speaking, loss of balance or coordination, trouble walking, seizures Redness, swelling, and blistering of the skin over hands and feet Severe or prolonged diarrhea Unusual bruising or bleeding Side effects that usually do not require medical attention (report to your care  team if they continue or are bothersome): Dry skin Headache Increased tears Nausea Pain, redness, or swelling with sores inside the mouth or throat Sensitivity to light Vomiting This list may not describe all possible side effects. Call your doctor for medical advice about side effects. You may report side effects to FDA at 1-800-FDA-1088. Where should I keep my medication? This medication is given in a hospital or clinic. It will not be stored at home. NOTE: This sheet is a summary. It may not cover all possible information. If you have questions about this medicine, talk to your doctor, pharmacist, or health care provider.  2023 Elsevier/Gold Standard (2021-11-20 00:00:00)  Octreotide Injection Solution What is this medication? OCTREOTIDE (ok TREE oh tide) treats high levels of growth hormone (acromegaly). It works by reducing the amount of growth hormone your body makes. This reduces symptoms and the risk of health  problems caused by too much growth hormone, such as diabetes and heart disease. It may also be used to treat diarrhea caused by neuroendocrine tumors. It works by slowing down the release of serotonin from the tumor cells. This reduces the number of bowel movements you have. This medicine may be used for other purposes; ask your health care provider or pharmacist if you have questions. COMMON BRAND NAME(S): Leatha Gilding, Sandostatin What should I tell my care team before I take this medication? They need to know if you have any of these conditions: Diabetes Gallbladder disease Kidney disease Liver disease Thyroid disease An unusual or allergic reaction to octreotide, other medications, foods, dyes, or preservatives Pregnant or trying to get pregnant Breast-feeding How should I use this medication? This medication is injected under the skin or into a vein. It is usually given by your care team in a hospital or clinic setting. If you get this medication at home, you will be taught how to prepare and give it. Use exactly as directed. Take it as directed on the prescription label at the same time every day. Keep taking it unless your care team tells you to stop. Allow the injection solution to come to room temperature before use. Do not warm it artificially. It is important that you put your used needles and syringes in a special sharps container. Do not put them in a trash can. If you do not have a sharps container, call your pharmacist or care team to get one. Talk to your care team about the use of this medication in children. Special care may be needed. Overdosage: If you think you have taken too much of this medicine contact a poison control center or emergency room at once. NOTE: This medicine is only for you. Do not share this medicine with others. What if I miss a dose? If you miss a dose, take it as soon as you can. If it is almost time for your next dose, take only that dose. Do not take  double or extra doses. What may interact with this medication? Bromocriptine Certain medications for blood pressure, heart disease, irregular heartbeat Cyclosporine Diuretics Medications for diabetes, including insulin Quinidine This list may not describe all possible interactions. Give your health care provider a list of all the medicines, herbs, non-prescription drugs, or dietary supplements you use. Also tell them if you smoke, drink alcohol, or use illegal drugs. Some items may interact with your medicine. What should I watch for while using this medication? Visit your care team for regular checks on your progress. Tell your care team if your symptoms do  not start to get better or if they get worse. To help reduce irritation at the injection site, use a different site for each injection and make sure the solution is at room temperature before use. This medication may cause decreases in blood sugar. Signs of low blood sugar include chills, cool, pale skin or cold sweats, drowsiness, extreme hunger, fast heartbeat, headache, nausea, nervousness or anxiety, shakiness, trembling, unsteadiness, tiredness, or weakness. Contact your care team right away if you experience any of these symptoms. This medication may increase blood sugar. The risk may be higher in patients who already have diabetes. Ask your care team what you can do to lower your risk of diabetes while taking this medication. You should make sure you get enough vitamin B12 while you are taking this medication. Discuss the foods you eat and the vitamins you take with your care team. What side effects may I notice from receiving this medication? Side effects that you should report to your care team as soon as possible: Allergic reactions--skin rash, itching, hives, swelling of the face, lips, tongue, or throat Gallbladder problems--severe stomach pain, nausea, vomiting, fever Heart rhythm changes--fast or irregular heartbeat, dizziness,  feeling faint or lightheaded, chest pain, trouble breathing High blood sugar (hyperglycemia)--increased thirst or amount of urine, unusual weakness or fatigue, blurry vision Low blood sugar (hypoglycemia)--tremors or shaking, anxiety, sweating, cold or clammy skin, confusion, dizziness, rapid heartbeat Low thyroid levels (hypothyroidism)--unusual weakness or fatigue, increased sensitivity to cold, constipation, hair loss, dry skin, weight gain, feelings of depression Low vitamin B12 level--pain, tingling, or numbness in the hands or feet, muscle weakness, dizziness, confusion, trouble concentrating Pancreatitis--severe stomach pain that spreads to your back or gets worse after eating or when touched, fever, nausea, vomiting Side effects that usually do not require medical attention (report to your care team if they continue or are bothersome): Diarrhea Dizziness Gas Headache Pain, redness, or irritation at injection site Stomach pain This list may not describe all possible side effects. Call your doctor for medical advice about side effects. You may report side effects to FDA at 1-800-FDA-1088. Where should I keep my medication? Keep out of the reach of children and pets. Store in the refrigerator. Protect from light. Allow to come to room temperature naturally. Do not use artificial heat. If protected from light, the injection may be stored between 20 and 30 degrees C (70 and 86 degrees F) for 14 days. After the initial use, throw away any unused portion of a multiple dose vial after 14 days. Get rid of any unused portions of the ampules after use. To get rid of medications that are no longer needed or have expired: Take the medication to a medication take-back program. Ask your pharmacy or law enforcement to find a location. If you cannot return the medication, ask your pharmacist or care team how to get rid of the medication safely. NOTE: This sheet is a summary. It may not cover all possible  information. If you have questions about this medicine, talk to your doctor, pharmacist, or health care provider.  2023 Elsevier/Gold Standard (2021-10-24 00:00:00)  The chemotherapy medication bag should finish at 46 hours, 96 hours, or 7 days. For example, if your pump is scheduled for 46 hours and it was put on at 4:00 p.m., it should finish at 2:00 p.m. the day it is scheduled to come off regardless of your appointment time.     Estimated time to finish at 11:00 a.m. on Thursday 09/05/2022.   If  the display on your pump reads "Low Volume" and it is beeping, take the batteries out of the pump and come to the cancer center for it to be taken off.   If the pump alarms go off prior to the pump reading "Low Volume" then call [phone] and someone can assist you.  If the plunger comes out and the chemotherapy medication is leaking out, please use your home chemo spill kit to clean up the spill. Do NOT use paper towels or other household products.  If you have problems or questions regarding your pump, please call either [phone] (24 hours a day) or the cancer center Monday-Friday 8:00 a.m.- 4:30 p.m. at the clinic number and we will assist you. If you are unable to get assistance, then go to the nearest Emergency Department and ask the staff to contact the IV team for assistance.

## 2022-09-03 NOTE — Progress Notes (Signed)
[facility] OFFICE PROGRESS NOTE   Diagnosis: Neuroendocrine carcinoma  INTERVAL HISTORY:   Mr Pullin returns as scheduled.  He completed another cycle of FOLFOX on 08/20/2022.  No nausea/vomiting.  He has developed a sore over the tongue the past few days.  He had constipation followed by diarrhea.  Cold sensitivity lasted a few days following chemotherapy.  He complains of insomnia.  He has abdominal discomfort.  He is being followed by home palliative care.  Objective:  Vital signs in last 24 hours:  Blood pressure 115/60, pulse 88, temperature 98.1 F (36.7 C), temperature source Oral, resp. rate 18, height '5\' 10"'$  (1.778 m), weight 146 lb 9.6 oz (66.5 kg), SpO2 100 %.    HEENT: 3 mm ulcer at the tip of the tongue, no other ulcers or thrush Resp: Lungs clear bilaterally Cardio: Regular rate and rhythm GI: The liver is palpable throughout the upper abdomen, no apparent ascites Vascular: No leg edema    Portacath/PICC-without erythema  Lab Results:  Lab Results  Component Value Date   WBC 6.3 09/03/2022   HGB 10.5 (L) 09/03/2022   HCT 31.5 (L) 09/03/2022   MCV 101.3 (H) 09/03/2022   PLT 135 (L) 09/03/2022   NEUTROABS 5.0 09/03/2022    CMP  Lab Results  Component Value Date   NA 141 09/03/2022   K 3.6 09/03/2022   CL 97 (L) 09/03/2022   CO2 33 (H) 09/03/2022   GLUCOSE 191 (H) 09/03/2022   BUN 42 (H) 09/03/2022   CREATININE 1.34 (H) 09/03/2022   CALCIUM 9.6 09/03/2022   PROT 6.9 09/03/2022   ALBUMIN 4.1 09/03/2022   AST 28 09/03/2022   ALT 18 09/03/2022   ALKPHOS 161 (H) 09/03/2022   BILITOT 1.5 (H) 09/03/2022   GFRNONAA 58 (L) 09/03/2022   GFRAA >60 01/19/2020    No results found for: "CEA1", "CEA", "BMW413", "CA125"  Lab Results  Component Value Date   INR 1.2 08/13/2022   LABPROT 15.2 08/13/2022    Imaging:  No results found.  Medications: I have reviewed the patient's current medications.   Assessment/Plan: Pancreatic  neuroendocrine tumor, WHO grade 2, pancreatic head mass and small peripancreatic/celiac nodes on a CT 02/04/2014 EUS revealed evidence of multiple liver metastases-status post an FNA biopsy of a left liver lesion 02/10/2014 confirming a neuroendocrine tumor   Octreotide scan 04/01/2014 with multiple foci of metastatic neuroendocrine tumor in the liver Monthly Sandostatin started 03/16/2014 Restaging CT 07/04/2014 with resolution of a previously noted pancreas head lesion and probable progression of liver metastases Restaging CT 10/31/2014-stable liver lesions, no evidence of a pancreas mass, stable. Restaging CT 02/28/2015-stable hepatic metastases, stable pancreas lesion unchanged Restaging CT 08/30/2015-stable pancreas mass, slight enlargement of hepatic metastases Monthly Sandostatin continued Restaging CTs 01/17/2016 revealed enlargement of liver lesions, no new lesions, enlargement of the pancreas head mass Gallium DOTATATE scan 10/01/2016 confirmed uptake in the liver metastases, pancreas primary, peripancreatic adenopathy, and left iliac bone Cycle 1 Lutathera 03/05/2017 Cycle 2 Lutathera 04/30/2017 Cycle 3 Lutathera 07/02/2017 Cycle 4 Lutathera 08/27/2017 Netspot scan 09/29/2017-decreased radiotracer activity within the pancreas head, peripancreatic lymph node, and solitary skeletal lesion.  Liver lesions have increased in size with a decrease in radiotracer activity Monthly Sandostatin continued Netspot scan 05/20/2018-stable to decreased size of liver lesions, most have decreased SUV, similar uptake in the pancreas lesion, previously noted peripancreatic lymph node has resolved him a mild decrease in uptake associated with a left iliac metastasis, no evidence of disease progression Monthly Sandostatin  continued Netspot scan 01/29/2019- overall stable to improved, 1 lesion left hepatic lobe has increased tracer activity-unchanged in size, majority of hepatic lesions have reduced activity  compared to the pretreatment scan, decreased activity left iliac bone lesion, stable activity in the head of the pancreas lesion, no new lesions Monthly Sandostatin continued Netspot on September 24, 2019-increase in size of 3 liver lesions with associated stable radiotracer activity, no new lesions, stable small left iliac lesion Cycle 1 salvage Lutathera 10/27/2019 Cycle 2 salvage Lutathera 12/22/2019 Netspot on 02/15/2020-decrease in radiotracer activity in the liver metastases with a decrease in size of several lesions, no new lesions, no progressive disease in the bowel or mesentery, decreased activity in the left iliac bone lesion Monthly Sandostatin continued  Netspot 10/13/2020-decrease in radiotracer activity in the left and right liver, decreased size of liver lesions, one lesion in the superior right liver has increased in size and has no radiotracer activity, no evidence of metastatic disease outside the liver PET 11/15/2020- moderate hypermetabolic activity associated with several enlarging liver lesions that had limited activity on the dotatate PET, a lesion in the left lateral hepatic lobe with mild persistent dotatate activity has mild peripheral FDG activity and is decreased in size from a dotatate PET 1 year ago.  No hypermetabolic activity in the pancreas or bowel.  No hypermetabolic activity in the previous dotatate avid left iliac lesion Monthly Sandostatin continued Cycle 1 capecitabine/temozolomide 01/22/2021 (capecitabine twice daily days 1 through 14 of each 28-day cycle/temozolomide days 10 through 14) Cycle 2 capecitabine/temozolomide 02/19/2021 Cycle 3 capecitabine/temozolomide 03/19/2021 Cycle 4 capecitabine/temozolomide 04/17/2021 PET 05/11/2021-slight increase in size of several FDG avid liver lesions, no new hepatic lesions, no evidence of metastatic disease to the chest or bones Cycle 5 capecitabine/temozolomide 05/16/2021 Cycle 6 capecitabine/temozolomide 06/25/2021,  capecitabine dose reduced Cycle 7 capecitabine/temozolomide 07/23/2021 Cycle 8 capecitabine/temozolomide 08/20/2021 PET 09/13/2021-mildly progressive hepatic metastases Everolimus 10/11/2021-discontinued after 2 weeks secondary to cost Cabozantinib 12/13/2021 Cabozantinib discontinued 12/24/2021 Cabozantinib resumed at a reduced dose of 20 mg daily beginning 01/12/2022 PET 03/28/2022-multifocal tracer avid liver metastases, mild increase in size and able to mild increase in tracer uptake, no extrahepatic disease identified Cabozantinib continued at a dose of 20 mg daily PET 07/15/2022-slight interval increase in size of hepatic masses with slight decrease in metabolic activity slight increase in pelvic ascites, no new site of disease Cycle 1 FOLFOX 08/07/2022 Cycle 2 FOLFOX 08/20/2022 Cycle 3 FOLFOX 09/03/2022    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 chronic exertional dyspnea following treatment for the seminoma   5. aortic stenosis on an echocardiogram December 2011, severe aortic stenosis on echocardiogram 12/06/2015, status post TAVR on 04/16/2016 6. lumbar disc surgery 2014   7. acute abdominal pain 02/04/2014-resolved   8.  Diabetes 9.  COVID-19 infection July 2022 10.  Admission 08/13/2022 with dyspnea-evidence of volume overload with an elevated BNP, improved with diuresis.  Dyspnea multifactorial-cardiac disease with mitral valve, pulmonic valve, and tricuspid valve regurgitation     Disposition: Mr Corigliano has metastatic neuroendocrine carcinoma with extensive involvement of the liver.  He is completed 2 cycles of FOLFOX.  He is tolerated the chemotherapy well.  His performance status remains poor.  We will follow-up on the chromogranin A level from today.  He will proceed  with cycle 3 FOLFOX today.  He will begin a trial of tramadol for pain and lorazepam for insomnia.  We discussed CPR and ACLS.  He would like to be placed on a no CODE BLUE status.  Mr Coaxum will return for an office visit with the plan to continue chemotherapy in 2 weeks.  Betsy Coder, MD  09/03/2022  9:07 AM

## 2022-09-04 ENCOUNTER — Encounter: Payer: Self-pay | Admitting: Oncology

## 2022-09-04 LAB — CHROMOGRANIN A

## 2022-09-05 ENCOUNTER — Inpatient Hospital Stay: Payer: Medicare Other | Attending: Oncology

## 2022-09-05 VITALS — BP 103/68 | HR 90 | Temp 97.6°F | Resp 20

## 2022-09-05 DIAGNOSIS — Z5111 Encounter for antineoplastic chemotherapy: Secondary | ICD-10-CM | POA: Insufficient documentation

## 2022-09-05 DIAGNOSIS — Z452 Encounter for adjustment and management of vascular access device: Secondary | ICD-10-CM | POA: Diagnosis not present

## 2022-09-05 DIAGNOSIS — D3A8 Other benign neuroendocrine tumors: Secondary | ICD-10-CM

## 2022-09-05 DIAGNOSIS — C7B8 Other secondary neuroendocrine tumors: Secondary | ICD-10-CM | POA: Insufficient documentation

## 2022-09-05 DIAGNOSIS — C7A8 Other malignant neuroendocrine tumors: Secondary | ICD-10-CM | POA: Diagnosis not present

## 2022-09-05 MED ORDER — SODIUM CHLORIDE 0.9% FLUSH
10.0000 mL | INTRAVENOUS | Status: DC | PRN
  Administered 2022-09-05: 10 mL

## 2022-09-05 MED ORDER — HEPARIN SOD (PORK) LOCK FLUSH 100 UNIT/ML IV SOLN
500.0000 [IU] | Freq: Once | INTRAVENOUS | Status: AC | PRN
  Administered 2022-09-05: 500 [IU]

## 2022-09-05 NOTE — Patient Instructions (Signed)

## 2022-09-06 ENCOUNTER — Encounter: Payer: PRIVATE HEALTH INSURANCE | Admitting: Family Medicine

## 2022-09-10 ENCOUNTER — Telehealth: Payer: Self-pay | Admitting: Dietician

## 2022-09-10 NOTE — Telephone Encounter (Signed)
Patient screened on MST. First attempt to reach to follow up on IP RD assessment/MST.  Patient states talking with brother. Provided my cell# in text to return call.  April Manson, RDN, LDN Registered Dietitian, [place] Part Time Remote (Usual office hours: Tuesday-Thursday) Cell: [phone]

## 2022-09-15 ENCOUNTER — Other Ambulatory Visit: Payer: Self-pay | Admitting: Oncology

## 2022-09-17 ENCOUNTER — Telehealth: Payer: Self-pay | Admitting: Dietician

## 2022-09-17 ENCOUNTER — Inpatient Hospital Stay: Payer: Medicare Other

## 2022-09-17 ENCOUNTER — Encounter: Payer: Medicare Other | Admitting: Dietician

## 2022-09-17 ENCOUNTER — Inpatient Hospital Stay (HOSPITAL_BASED_OUTPATIENT_CLINIC_OR_DEPARTMENT_OTHER): Payer: Medicare Other | Admitting: Nurse Practitioner

## 2022-09-17 ENCOUNTER — Encounter: Payer: Self-pay | Admitting: Nurse Practitioner

## 2022-09-17 VITALS — BP 121/77 | HR 92 | Resp 18

## 2022-09-17 VITALS — BP 106/69 | HR 94 | Temp 98.1°F | Resp 18 | Ht 70.0 in | Wt 150.0 lb

## 2022-09-17 DIAGNOSIS — Z5111 Encounter for antineoplastic chemotherapy: Secondary | ICD-10-CM | POA: Diagnosis not present

## 2022-09-17 DIAGNOSIS — D3A8 Other benign neuroendocrine tumors: Secondary | ICD-10-CM

## 2022-09-17 DIAGNOSIS — Z452 Encounter for adjustment and management of vascular access device: Secondary | ICD-10-CM | POA: Diagnosis not present

## 2022-09-17 DIAGNOSIS — C7A8 Other malignant neuroendocrine tumors: Secondary | ICD-10-CM | POA: Diagnosis not present

## 2022-09-17 DIAGNOSIS — C7B8 Other secondary neuroendocrine tumors: Secondary | ICD-10-CM | POA: Diagnosis not present

## 2022-09-17 LAB — CBC WITH DIFFERENTIAL (CANCER CENTER ONLY)
Abs Immature Granulocytes: 0.01 10*3/uL (ref 0.00–0.07)
Basophils Absolute: 0 10*3/uL (ref 0.0–0.1)
Basophils Relative: 1 %
Eosinophils Absolute: 0.2 10*3/uL (ref 0.0–0.5)
Eosinophils Relative: 2 %
HCT: 28.8 % — ABNORMAL LOW (ref 39.0–52.0)
Hemoglobin: 9.6 g/dL — ABNORMAL LOW (ref 13.0–17.0)
Immature Granulocytes: 0 %
Lymphocytes Relative: 5 %
Lymphs Abs: 0.3 10*3/uL — ABNORMAL LOW (ref 0.7–4.0)
MCH: 34.3 pg — ABNORMAL HIGH (ref 26.0–34.0)
MCHC: 33.3 g/dL (ref 30.0–36.0)
MCV: 102.9 fL — ABNORMAL HIGH (ref 80.0–100.0)
Monocytes Absolute: 1 10*3/uL (ref 0.1–1.0)
Monocytes Relative: 15 %
Neutro Abs: 5.3 10*3/uL (ref 1.7–7.7)
Neutrophils Relative %: 77 %
Platelet Count: 127 10*3/uL — ABNORMAL LOW (ref 150–400)
RBC: 2.8 MIL/uL — ABNORMAL LOW (ref 4.22–5.81)
RDW: 16.2 % — ABNORMAL HIGH (ref 11.5–15.5)
WBC Count: 6.9 10*3/uL (ref 4.0–10.5)
nRBC: 0 % (ref 0.0–0.2)

## 2022-09-17 LAB — CMP (CANCER CENTER ONLY)
ALT: 17 U/L (ref 0–44)
AST: 28 U/L (ref 15–41)
Albumin: 3.9 g/dL (ref 3.5–5.0)
Alkaline Phosphatase: 118 U/L (ref 38–126)
Anion gap: 9 (ref 5–15)
BUN: 30 mg/dL — ABNORMAL HIGH (ref 8–23)
CO2: 31 mmol/L (ref 22–32)
Calcium: 9.3 mg/dL (ref 8.9–10.3)
Chloride: 101 mmol/L (ref 98–111)
Creatinine: 1.03 mg/dL (ref 0.61–1.24)
GFR, Estimated: >60 mL/min (ref >60)
Glucose, Bld: 156 mg/dL — ABNORMAL HIGH (ref 70–99)
Potassium: 3.6 mmol/L (ref 3.5–5.1)
Sodium: 141 mmol/L (ref 135–145)
Total Bilirubin: 1.4 mg/dL — ABNORMAL HIGH (ref 0.3–1.2)
Total Protein: 6.4 g/dL — ABNORMAL LOW (ref 6.5–8.1)

## 2022-09-17 MED ORDER — OXALIPLATIN CHEMO INJECTION 100 MG/20ML
65.0000 mg/m2 | Freq: Once | INTRAVENOUS | Status: AC
  Administered 2022-09-17: 125 mg via INTRAVENOUS
  Filled 2022-09-17: qty 20

## 2022-09-17 MED ORDER — DEXTROSE 5 % IV SOLN: Freq: Once | INTRAVENOUS | Status: AC

## 2022-09-17 MED ORDER — PREDNISONE 10 MG PO TABS: 10.0000 mg | ORAL_TABLET | Freq: Every day | ORAL | 1 refills | Status: DC

## 2022-09-17 MED ORDER — LEUCOVORIN CALCIUM INJECTION 350 MG
300.0000 mg/m2 | Freq: Once | INTRAVENOUS | Status: AC
  Administered 2022-09-17: 568 mg via INTRAVENOUS
  Filled 2022-09-17: qty 28.4

## 2022-09-17 MED ORDER — SODIUM CHLORIDE 0.9 % IV SOLN
2000.0000 mg/m2 | INTRAVENOUS | Status: DC
  Administered 2022-09-17: 3500 mg via INTRAVENOUS
  Filled 2022-09-17: qty 70

## 2022-09-17 MED ORDER — SODIUM CHLORIDE 0.9 % IV SOLN
10.0000 mg | Freq: Once | INTRAVENOUS | Status: AC
  Administered 2022-09-17: 10 mg via INTRAVENOUS
  Filled 2022-09-17: qty 10

## 2022-09-17 MED ORDER — FLUOROURACIL CHEMO INJECTION 2.5 GM/50ML
300.0000 mg/m2 | Freq: Once | INTRAVENOUS | Status: AC
  Administered 2022-09-17: 550 mg via INTRAVENOUS
  Filled 2022-09-17: qty 11

## 2022-09-17 MED ORDER — PALONOSETRON HCL INJECTION 0.25 MG/5ML
0.2500 mg | Freq: Once | INTRAVENOUS | Status: AC
  Administered 2022-09-17: 0.25 mg via INTRAVENOUS
  Filled 2022-09-17: qty 5

## 2022-09-17 NOTE — Progress Notes (Signed)
Patient seen by Lisa Thomas NP today  Vitals are within treatment parameters.  Labs reviewed by Lisa Thomas NP and are within treatment parameters.  Per physician team, patient is ready for treatment and there are NO modifications to the treatment plan.     

## 2022-09-17 NOTE — Patient Instructions (Signed)
[facility]   Discharge Instructions: Thank you for choosing [facility] to provide your oncology and hematology care.   If you have a lab appointment with the [facility], please go directly to the [facility] and check in at the registration area.   Wear comfortable clothing and clothing appropriate for easy access to any Portacath or PICC line.   We strive to give you quality time with your provider. You may need to reschedule your appointment if you arrive late (15 or more minutes).  Arriving late affects you and other patients whose appointments are after yours.  Also, if you miss three or more appointments without notifying the office, you may be dismissed from the clinic at the provider's discretion.      For prescription refill requests, have your pharmacy contact our office and allow 72 hours for refills to be completed.    Today you received the following chemotherapy and/or immunotherapy agents Oxaliplatin (ELOXATIN), Leucovorin, & Flourouracil (ADRUCIL).      To help prevent nausea and vomiting after your treatment, we encourage you to take your nausea medication as directed.  BELOW ARE SYMPTOMS THAT SHOULD BE REPORTED IMMEDIATELY: *FEVER GREATER THAN 100.4 F (38 C) OR HIGHER *CHILLS OR SWEATING *NAUSEA AND VOMITING THAT IS NOT CONTROLLED WITH YOUR NAUSEA MEDICATION *UNUSUAL SHORTNESS OF BREATH *UNUSUAL BRUISING OR BLEEDING *URINARY PROBLEMS (pain or burning when urinating, or frequent urination) *BOWEL PROBLEMS (unusual diarrhea, constipation, pain near the anus) TENDERNESS IN MOUTH AND THROAT WITH OR WITHOUT PRESENCE OF ULCERS (sore throat, sores in mouth, or a toothache) UNUSUAL RASH, SWELLING OR PAIN  UNUSUAL VAGINAL DISCHARGE OR ITCHING   Items with * indicate a potential emergency and should be followed up as soon as possible or go to the Emergency Department if any problems should occur.  Please show the  CHEMOTHERAPY ALERT CARD or IMMUNOTHERAPY ALERT CARD at check-in to the Emergency Department and triage nurse.  Should you have questions after your visit or need to cancel or reschedule your appointment, please contact [facility]  Dept: [phone]  and follow the prompts.  Office hours are 8:00 a.m. to 4:30 p.m. Monday - Friday. Please note that voicemails left after 4:00 p.m. may not be returned until the following business day.  We are closed weekends and major holidays. You have access to a nurse at all times for urgent questions. Please call the main number to the clinic Dept: [phone] and follow the prompts.   For any non-urgent questions, you may also contact your provider using MyChart. We now offer e-Visits for anyone 61 and older to request care online for non-urgent symptoms. For details visit [email or web address].   Also download the MyChart app! Go to the app store, search "MyChart", open the app, select [facility], and log in with your MyChart username and password.  Oxaliplatin Injection What is this medication? OXALIPLATIN (ox AL i PLA tin) treats some types of cancer. It works by slowing down the growth of cancer cells. This medicine may be used for other purposes; ask your health care provider or pharmacist if you have questions. COMMON BRAND NAME(S): Eloxatin What should I tell my care team before I take this medication? They need to know if you have any of these conditions: Heart disease History of irregular heartbeat or rhythm Liver disease Low blood cell levels (white cells, red cells, and platelets) Lung or breathing disease, such as asthma Take medications  that treat or prevent blood clots Tingling of the fingers, toes, or other nerve disorder An unusual or allergic reaction to oxaliplatin, other medications, foods, dyes, or preservatives If you or your partner are pregnant or trying to get pregnant Breast-feeding How  should I use this medication? This medication is injected into a vein. It is given by your care team in a hospital or clinic setting. Talk to your care team about the use of this medication in children. Special care may be needed. Overdosage: If you think you have taken too much of this medicine contact a poison control center or emergency room at once. NOTE: This medicine is only for you. Do not share this medicine with others. What if I miss a dose? Keep appointments for follow-up doses. It is important not to miss a dose. Call your care team if you are unable to keep an appointment. What may interact with this medication? Do not take this medication with any of the following: Cisapride Dronedarone Pimozide Thioridazine This medication may also interact with the following: Aspirin and aspirin-like medications Certain medications that treat or prevent blood clots, such as warfarin, apixaban, dabigatran, and rivaroxaban Cisplatin Cyclosporine Diuretics Medications for infection, such as acyclovir, adefovir, amphotericin B, bacitracin, cidofovir, foscarnet, ganciclovir, gentamicin, pentamidine, vancomycin NSAIDs, medications for pain and inflammation, such as ibuprofen or naproxen Other medications that cause heart rhythm changes Pamidronate Zoledronic acid This list may not describe all possible interactions. Give your health care provider a list of all the medicines, herbs, non-prescription drugs, or dietary supplements you use. Also tell them if you smoke, drink alcohol, or use illegal drugs. Some items may interact with your medicine. What should I watch for while using this medication? Your condition will be monitored carefully while you are receiving this medication. You may need blood work while taking this medication. This medication may make you feel generally unwell. This is not uncommon as chemotherapy can affect healthy cells as well as cancer cells. Report any side effects.  Continue your course of treatment even though you feel ill unless your care team tells you to stop. This medication may increase your risk of getting an infection. Call your care team for advice if you get a fever, chills, sore throat, or other symptoms of a cold or flu. Do not treat yourself. Try to avoid being around people who are sick. Avoid taking medications that contain aspirin, acetaminophen, ibuprofen, naproxen, or ketoprofen unless instructed by your care team. These medications may hide a fever. Be careful brushing or flossing your teeth or using a toothpick because you may get an infection or bleed more easily. If you have any dental work done, tell your dentist you are receiving this medication. This medication can make you more sensitive to cold. Do not drink cold drinks or use ice. Cover exposed skin before coming in contact with cold temperatures or cold objects. When out in cold weather wear warm clothing and cover your mouth and nose to warm the air that goes into your lungs. Tell your care team if you get sensitive to the cold. Talk to your care team if you or your partner are pregnant or think either of you might be pregnant. This medication can cause serious birth defects if taken during pregnancy and for 9 months after the last dose. A negative pregnancy test is required before starting this medication. A reliable form of contraception is recommended while taking this medication and for 9 months after the last dose. Talk to  your care team about effective forms of contraception. Do not father a child while taking this medication and for 6 months after the last dose. Use a condom while having sex during this time period. Do not breastfeed while taking this medication and for 3 months after the last dose. This medication may cause infertility. Talk to your care team if you are concerned about your fertility. What side effects may I notice from receiving this medication? Side effects that  you should report to your care team as soon as possible: Allergic reactions--skin rash, itching, hives, swelling of the face, lips, tongue, or throat Bleeding--bloody or black, tar-like stools, vomiting blood or brown material that looks like coffee grounds, red or dark brown urine, small red or purple spots on skin, unusual bruising or bleeding Dry cough, shortness of breath or trouble breathing Heart rhythm changes--fast or irregular heartbeat, dizziness, feeling faint or lightheaded, chest pain, trouble breathing Infection--fever, chills, cough, sore throat, wounds that don't heal, pain or trouble when passing urine, general feeling of discomfort or being unwell Liver injury--right upper belly pain, loss of appetite, nausea, light-colored stool, dark yellow or brown urine, yellowing skin or eyes, unusual weakness or fatigue Low red blood cell level--unusual weakness or fatigue, dizziness, headache, trouble breathing Muscle injury--unusual weakness or fatigue, muscle pain, dark yellow or brown urine, decrease in amount of urine Pain, tingling, or numbness in the hands or feet Sudden and severe headache, confusion, change in vision, seizures, which may be signs of posterior reversible encephalopathy syndrome (PRES) Unusual bruising or bleeding Side effects that usually do not require medical attention (report to your care team if they continue or are bothersome): Diarrhea Nausea Pain, redness, or swelling with sores inside the mouth or throat Unusual weakness or fatigue Vomiting This list may not describe all possible side effects. Call your doctor for medical advice about side effects. You may report side effects to FDA at 1-800-FDA-1088. Where should I keep my medication? This medication is given in a hospital or clinic. It will not be stored at home. NOTE: This sheet is a summary. It may not cover all possible information. If you have questions about this medicine, talk to your doctor,  pharmacist, or health care provider.  2023 Elsevier/Gold Standard (2007-09-12 00:00:00)  Leucovorin Injection What is this medication? LEUCOVORIN (loo koe VOR in) prevents side effects from certain medications, such as methotrexate. It works by increasing folate levels. This helps protect healthy cells in your body. It may also be used to treat anemia caused by low levels of folate. It can also be used with fluorouracil, a type of chemotherapy, to treat colorectal cancer. It works by increasing the effects of fluorouracil in the body. This medicine may be used for other purposes; ask your health care provider or pharmacist if you have questions. What should I tell my care team before I take this medication? They need to know if you have any of these conditions: Anemia from low levels of vitamin B12 in the blood An unusual or allergic reaction to leucovorin, folic acid, other medications, foods, dyes, or preservatives Pregnant or trying to get pregnant Breastfeeding How should I use this medication? This medication is injected into a vein or a muscle. It is given by your care team in a hospital or clinic setting. Talk to your care team about the use of this medication in children. Special care may be needed. Overdosage: If you think you have taken too much of this medicine contact a poison  control center or emergency room at once. NOTE: This medicine is only for you. Do not share this medicine with others. What if I miss a dose? Keep appointments for follow-up doses. It is important not to miss your dose. Call your care team if you are unable to keep an appointment. What may interact with this medication? Capecitabine Fluorouracil Phenobarbital Phenytoin Primidone Trimethoprim;sulfamethoxazole This list may not describe all possible interactions. Give your health care provider a list of all the medicines, herbs, non-prescription drugs, or dietary supplements you use. Also tell them if you  smoke, drink alcohol, or use illegal drugs. Some items may interact with your medicine. What should I watch for while using this medication? Your condition will be monitored carefully while you are receiving this medication. This medication may increase the side effects of 5-fluorouracil. Tell your care team if you have diarrhea or mouth sores that do not get better or that get worse. What side effects may I notice from receiving this medication? Side effects that you should report to your care team as soon as possible: Allergic reactions--skin rash, itching, hives, swelling of the face, lips, tongue, or throat This list may not describe all possible side effects. Call your doctor for medical advice about side effects. You may report side effects to FDA at 1-800-FDA-1088. Where should I keep my medication? This medication is given in a hospital or clinic. It will not be stored at home. NOTE: This sheet is a summary. It may not cover all possible information. If you have questions about this medicine, talk to your doctor, pharmacist, or health care provider.  2023 Elsevier/Gold Standard (2021-12-25 00:00:00)   Fluorouracil Injection What is this medication? FLUOROURACIL (flure oh YOOR a sil) treats some types of cancer. It works by slowing down the growth of cancer cells. This medicine may be used for other purposes; ask your health care provider or pharmacist if you have questions. COMMON BRAND NAME(S): Adrucil What should I tell my care team before I take this medication? They need to know if you have any of these conditions: Blood disorders Dihydropyrimidine dehydrogenase (DPD) deficiency Infection, such as chickenpox, cold sores, herpes Kidney disease Liver disease Poor nutrition Recent or ongoing radiation therapy An unusual or allergic reaction to fluorouracil, other medications, foods, dyes, or preservatives If you or your partner are pregnant or trying to get  pregnant Breast-feeding How should I use this medication? This medication is injected into a vein. It is administered by your care team in a hospital or clinic setting. Talk to your care team about the use of this medication in children. Special care may be needed. Overdosage: If you think you have taken too much of this medicine contact a poison control center or emergency room at once. NOTE: This medicine is only for you. Do not share this medicine with others. What if I miss a dose? Keep appointments for follow-up doses. It is important not to miss your dose. Call your care team if you are unable to keep an appointment. What may interact with this medication? Do not take this medication with any of the following: Live virus vaccines This medication may also interact with the following: Medications that treat or prevent blood clots, such as warfarin, enoxaparin, dalteparin This list may not describe all possible interactions. Give your health care provider a list of all the medicines, herbs, non-prescription drugs, or dietary supplements you use. Also tell them if you smoke, drink alcohol, or use illegal drugs. Some items may  interact with your medicine. What should I watch for while using this medication? Your condition will be monitored carefully while you are receiving this medication. This medication may make you feel generally unwell. This is not uncommon as chemotherapy can affect healthy cells as well as cancer cells. Report any side effects. Continue your course of treatment even though you feel ill unless your care team tells you to stop. In some cases, you may be given additional medications to help with side effects. Follow all directions for their use. This medication may increase your risk of getting an infection. Call your care team for advice if you get a fever, chills, sore throat, or other symptoms of a cold or flu. Do not treat yourself. Try to avoid being around people who are  sick. This medication may increase your risk to bruise or bleed. Call your care team if you notice any unusual bleeding. Be careful brushing or flossing your teeth or using a toothpick because you may get an infection or bleed more easily. If you have any dental work done, tell your dentist you are receiving this medication. Avoid taking medications that contain aspirin, acetaminophen, ibuprofen, naproxen, or ketoprofen unless instructed by your care team. These medications may hide a fever. Do not treat diarrhea with over the counter products. Contact your care team if you have diarrhea that lasts more than 2 days or if it is severe and watery. This medication can make you more sensitive to the sun. Keep out of the sun. If you cannot avoid being in the sun, wear protective clothing and sunscreen. Do not use sun lamps, tanning beds, or tanning booths. Talk to your care team if you or your partner wish to become pregnant or think you might be pregnant. This medication can cause serious birth defects if taken during pregnancy and for 3 months after the last dose. A reliable form of contraception is recommended while taking this medication and for 3 months after the last dose. Talk to your care team about effective forms of contraception. Do not father a child while taking this medication and for 3 months after the last dose. Use a condom while having sex during this time period. Do not breastfeed while taking this medication. This medication may cause infertility. Talk to your care team if you are concerned about your fertility. What side effects may I notice from receiving this medication? Side effects that you should report to your care team as soon as possible: Allergic reactions--skin rash, itching, hives, swelling of the face, lips, tongue, or throat Heart attack--pain or tightness in the chest, shoulders, arms, or jaw, nausea, shortness of breath, cold or clammy skin, feeling faint or  lightheaded Heart failure--shortness of breath, swelling of the ankles, feet, or hands, sudden weight gain, unusual weakness or fatigue Heart rhythm changes--fast or irregular heartbeat, dizziness, feeling faint or lightheaded, chest pain, trouble breathing High ammonia level--unusual weakness or fatigue, confusion, loss of appetite, nausea, vomiting, seizures Infection--fever, chills, cough, sore throat, wounds that don't heal, pain or trouble when passing urine, general feeling of discomfort or being unwell Low red blood cell level--unusual weakness or fatigue, dizziness, headache, trouble breathing Pain, tingling, or numbness in the hands or feet, muscle weakness, change in vision, confusion or trouble speaking, loss of balance or coordination, trouble walking, seizures Redness, swelling, and blistering of the skin over hands and feet Severe or prolonged diarrhea Unusual bruising or bleeding Side effects that usually do not require medical attention (report to your  care team if they continue or are bothersome): Dry skin Headache Increased tears Nausea Pain, redness, or swelling with sores inside the mouth or throat Sensitivity to light Vomiting This list may not describe all possible side effects. Call your doctor for medical advice about side effects. You may report side effects to FDA at 1-800-FDA-1088. Where should I keep my medication? This medication is given in a hospital or clinic. It will not be stored at home. NOTE: This sheet is a summary. It may not cover all possible information. If you have questions about this medicine, talk to your doctor, pharmacist, or health care provider.  2023 Elsevier/Gold Standard (2021-11-20 00:00:00)  The chemotherapy medication bag should finish at 46 hours, 96 hours, or 7 days. For example, if your pump is scheduled for 46 hours and it was put on at 4:00 p.m., it should finish at 2:00 p.m. the day it is scheduled to come off regardless of your  appointment time.     Estimated time to finish at 12:00 p.m. on Thursday 09/19/2022.   If the display on your pump reads "Low Volume" and it is beeping, take the batteries out of the pump and come to the cancer center for it to be taken off.   If the pump alarms go off prior to the pump reading "Low Volume" then call [phone] and someone can assist you.  If the plunger comes out and the chemotherapy medication is leaking out, please use your home chemo spill kit to clean up the spill. Do NOT use paper towels or other household products.  If you have problems or questions regarding your pump, please call either [phone] (24 hours a day) or the cancer center Monday-Friday 8:00 a.m.- 4:30 p.m. at the clinic number and we will assist you. If you are unable to get assistance, then go to the nearest Emergency Department and ask the staff to contact the IV team for assistance.

## 2022-09-17 NOTE — Telephone Encounter (Signed)
Second attempt to reach to follow up on IP RD assessment/MST.  Provided [facility] on voice mail and my cell# in text to return call or best time to reach.  April Manson, RDN, LDN Registered Dietitian, [facility] Part Time Remote (Usual office hours: Tuesday-Thursday) Cell: [phone]

## 2022-09-17 NOTE — Progress Notes (Signed)
[place] OFFICE PROGRESS NOTE   Diagnosis: Neuroendocrine carcinoma  INTERVAL HISTORY:   Mr. Butzer returns as scheduled.  He completed cycle 3 FOLFOX 09/03/2022.  He denies nausea/vomiting associated with chemotherapy.  No mouth sores.  Diarrhea is controlled with Imodium.  Maximum loose stools in 24 hours estimated at 3-4.  He denies neuropathy symptoms.  Main complaint is progressive fatigue.  Objective:  Vital signs in last 24 hours:  Blood pressure 106/69, pulse 94, temperature 98.1 F (36.7 C), temperature source Oral, resp. rate 18, height 5' 10"$  (1.778 m), weight 150 lb (68 kg), SpO2 94 %.    HEENT: No thrush or ulcers. Resp: Lungs are clear.  Breath sounds diminished at the lower lung fields.  No respiratory distress. Cardio: Regular rate and rhythm. GI: Liver is massively enlarged, palpable throughout the right abdomen and crossing midline. Vascular: No significant leg edema. Neuro: Alert and oriented. Skin: Palms without erythema. Port-A-Cath without erythema.  Lab Results:  Lab Results  Component Value Date   WBC 6.9 09/17/2022   HGB 9.6 (L) 09/17/2022   HCT 28.8 (L) 09/17/2022   MCV 102.9 (H) 09/17/2022   PLT 127 (L) 09/17/2022   NEUTROABS 5.3 09/17/2022    Imaging:  No results found.  Medications: I have reviewed the patient's current medications.  Assessment/Plan: Pancreatic neuroendocrine tumor, WHO grade 2, pancreatic head mass and small peripancreatic/celiac nodes on a CT 02/04/2014 EUS revealed evidence of multiple liver metastases-status post an FNA biopsy of a left liver lesion 02/10/2014 confirming a neuroendocrine tumor   Octreotide scan 04/01/2014 with multiple foci of metastatic neuroendocrine tumor in the liver Monthly Sandostatin started 03/16/2014 Restaging CT 07/04/2014 with resolution of a previously noted pancreas head lesion and probable progression of liver metastases Restaging CT 10/31/2014-stable liver lesions, no  evidence of a pancreas mass, stable. Restaging CT 02/28/2015-stable hepatic metastases, stable pancreas lesion unchanged Restaging CT 08/30/2015-stable pancreas mass, slight enlargement of hepatic metastases Monthly Sandostatin continued Restaging CTs 01/17/2016 revealed enlargement of liver lesions, no new lesions, enlargement of the pancreas head mass Gallium DOTATATE scan 10/01/2016 confirmed uptake in the liver metastases, pancreas primary, peripancreatic adenopathy, and left iliac bone Cycle 1 Lutathera 03/05/2017 Cycle 2 Lutathera 04/30/2017 Cycle 3 Lutathera 07/02/2017 Cycle 4 Lutathera 08/27/2017 Netspot scan 09/29/2017-decreased radiotracer activity within the pancreas head, peripancreatic lymph node, and solitary skeletal lesion.  Liver lesions have increased in size with a decrease in radiotracer activity Monthly Sandostatin continued Netspot scan 05/20/2018-stable to decreased size of liver lesions, most have decreased SUV, similar uptake in the pancreas lesion, previously noted peripancreatic lymph node has resolved him a mild decrease in uptake associated with a left iliac metastasis, no evidence of disease progression Monthly Sandostatin continued Netspot scan 01/29/2019- overall stable to improved, 1 lesion left hepatic lobe has increased tracer activity-unchanged in size, majority of hepatic lesions have reduced activity compared to the pretreatment scan, decreased activity left iliac bone lesion, stable activity in the head of the pancreas lesion, no new lesions Monthly Sandostatin continued Netspot on September 24, 2019-increase in size of 3 liver lesions with associated stable radiotracer activity, no new lesions, stable small left iliac lesion Cycle 1 salvage Lutathera 10/27/2019 Cycle 2 salvage Lutathera 12/22/2019 Netspot on 02/15/2020-decrease in radiotracer activity in the liver metastases with a decrease in size of several lesions, no new lesions, no progressive disease in the  bowel or mesentery, decreased activity in the left iliac bone lesion Monthly Sandostatin continued  Netspot 10/13/2020-decrease in radiotracer activity in  the left and right liver, decreased size of liver lesions, one lesion in the superior right liver has increased in size and has no radiotracer activity, no evidence of metastatic disease outside the liver PET 11/15/2020- moderate hypermetabolic activity associated with several enlarging liver lesions that had limited activity on the dotatate PET, a lesion in the left lateral hepatic lobe with mild persistent dotatate activity has mild peripheral FDG activity and is decreased in size from a dotatate PET 1 year ago.  No hypermetabolic activity in the pancreas or bowel.  No hypermetabolic activity in the previous dotatate avid left iliac lesion Monthly Sandostatin continued Cycle 1 capecitabine/temozolomide 01/22/2021 (capecitabine twice daily days 1 through 14 of each 28-day cycle/temozolomide days 10 through 14) Cycle 2 capecitabine/temozolomide 02/19/2021 Cycle 3 capecitabine/temozolomide 03/19/2021 Cycle 4 capecitabine/temozolomide 04/17/2021 PET 05/11/2021-slight increase in size of several FDG avid liver lesions, no new hepatic lesions, no evidence of metastatic disease to the chest or bones Cycle 5 capecitabine/temozolomide 05/16/2021 Cycle 6 capecitabine/temozolomide 06/25/2021, capecitabine dose reduced Cycle 7 capecitabine/temozolomide 07/23/2021 Cycle 8 capecitabine/temozolomide 08/20/2021 PET 09/13/2021-mildly progressive hepatic metastases Everolimus 10/11/2021-discontinued after 2 weeks secondary to cost Cabozantinib 12/13/2021 Cabozantinib discontinued 12/24/2021 Cabozantinib resumed at a reduced dose of 20 mg daily beginning 01/12/2022 PET 03/28/2022-multifocal tracer avid liver metastases, mild increase in size and able to mild increase in tracer uptake, no extrahepatic disease identified Cabozantinib continued at a dose of 20 mg daily PET  07/15/2022-slight interval increase in size of hepatic masses with slight decrease in metabolic activity slight increase in pelvic ascites, no new site of disease Cycle 1 FOLFOX 08/07/2022 CT abdomen/pelvis 08/13/2022-markedly enlarged liver with numerous large hepatic metastatic lesions without interval change compared with PET/CT from December 2023. Cycle 2 FOLFOX 08/20/2022 Cycle 3 FOLFOX 09/03/2022 Cycle 4 FOLFOX 09/17/2022    Chest Seminoma in 1990 treated with BEP and chest radiation at [facility] 3. 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 chronic exertional dyspnea following treatment for the seminoma   5. aortic stenosis on an echocardiogram December 2011, severe aortic stenosis on echocardiogram 12/06/2015, status post TAVR on 04/16/2016 6. lumbar disc surgery 2014   7. acute abdominal pain 02/04/2014-resolved   8.  Diabetes 9.  COVID-19 infection July 2022 10.  Admission 08/13/2022 with dyspnea-evidence of volume overload with an elevated BNP, improved with diuresis.  Dyspnea multifactorial-cardiac disease with mitral valve, pulmonic valve, and tricuspid valve regurgitation    Disposition: Mr. Heitmann appears unchanged.  He has completed 3 cycles of FOLFOX.  Plan to proceed with cycle 4 today as scheduled.  Restaging CTs prior to next office visit.  CBC and chemistry panel reviewed.  Labs adequate to proceed as above.    He will begin a trial of prednisone 10 mg daily for appetite and energy.  Potential side effects reviewed.  He agrees to proceed.  He will begin tomorrow.  He will return for lab, follow-up, chemotherapy in 2 weeks.  We are available to see him sooner if needed.    Ned Card ANP/GNP-BC   09/17/2022  9:04 AM

## 2022-09-17 NOTE — Telephone Encounter (Signed)
Patient returned call responding to voice mail.  He apologized for missing my call. He declined offer of nutritional consult.  He reports he's reaching out to palliative care and he has no questions or need for nutritional resources to aid in quality of life.  April Manson, RDN, LDN Registered Dietitian, [place] Part Time Remote (Usual office hours: Tuesday-Thursday) Mobile: [phone] Remote Office: [phone]

## 2022-09-18 ENCOUNTER — Telehealth: Payer: Self-pay

## 2022-09-18 NOTE — Telephone Encounter (Signed)
Patient is schedule for his Ct scan and aware of the time and place.

## 2022-09-19 ENCOUNTER — Other Ambulatory Visit: Payer: Self-pay

## 2022-09-19 ENCOUNTER — Inpatient Hospital Stay (HOSPITAL_BASED_OUTPATIENT_CLINIC_OR_DEPARTMENT_OTHER): Payer: Medicare Other | Admitting: Nurse Practitioner

## 2022-09-19 ENCOUNTER — Telehealth: Payer: Self-pay

## 2022-09-19 ENCOUNTER — Other Ambulatory Visit: Payer: Self-pay | Admitting: Nurse Practitioner

## 2022-09-19 ENCOUNTER — Inpatient Hospital Stay: Payer: Medicare Other

## 2022-09-19 VITALS — BP 113/64 | HR 98 | Temp 98.0°F | Resp 16

## 2022-09-19 DIAGNOSIS — C7B8 Other secondary neuroendocrine tumors: Secondary | ICD-10-CM | POA: Diagnosis not present

## 2022-09-19 DIAGNOSIS — Z5111 Encounter for antineoplastic chemotherapy: Secondary | ICD-10-CM | POA: Diagnosis not present

## 2022-09-19 DIAGNOSIS — C7A8 Other malignant neuroendocrine tumors: Secondary | ICD-10-CM | POA: Diagnosis not present

## 2022-09-19 DIAGNOSIS — D3A8 Other benign neuroendocrine tumors: Secondary | ICD-10-CM | POA: Diagnosis not present

## 2022-09-19 DIAGNOSIS — Z452 Encounter for adjustment and management of vascular access device: Secondary | ICD-10-CM | POA: Diagnosis not present

## 2022-09-19 MED ORDER — TORSEMIDE 20 MG PO TABS: 20.0000 mg | ORAL_TABLET | Freq: Every day | ORAL | 0 refills | Status: AC

## 2022-09-19 MED ORDER — SODIUM CHLORIDE 0.9% FLUSH
10.0000 mL | INTRAVENOUS | Status: DC | PRN
  Administered 2022-09-19: 10 mL

## 2022-09-19 MED ORDER — HEPARIN SOD (PORK) LOCK FLUSH 100 UNIT/ML IV SOLN
500.0000 [IU] | Freq: Once | INTRAVENOUS | Status: AC | PRN
  Administered 2022-09-19: 500 [IU]

## 2022-09-19 NOTE — Patient Instructions (Signed)

## 2022-09-19 NOTE — Telephone Encounter (Signed)
A hospice referral was order. Tonya from [facility] [employer] spoke with the son and they are schedule to meet with the family on 09/20/22 at 1:pm.

## 2022-09-19 NOTE — Progress Notes (Signed)
[place] OFFICE PROGRESS NOTE   Diagnosis: Neuroendocrine carcinoma  INTERVAL HISTORY:   Mr. Windom seen in an unscheduled visit.  He is here today for pump discontinuation.  He reports progressive fatigue.  He has progressive leg swelling, mild dyspnea.  He ran out of his fluid pill a couple of days ago.  He does not want to continue chemotherapy.  Objective:  Vital signs in last 24 hours:  Temperature 98, heart rate 98, respiratory rate 16, blood pressure 113/64, oxygen saturation 93%    Resp: Breath sounds diminished at the lower lung fields left greater than right.  Rales at the right lung base.  No respiratory distress. Cardio: Regular rate and rhythm. GI: Liver remains massively enlarged. Vascular: Pitting edema lower leg bilaterally. Neuro: Alert and oriented. Port-A-Cath without erythema.  Lab Results:  Lab Results  Component Value Date   WBC 6.9 09/17/2022   HGB 9.6 (L) 09/17/2022   HCT 28.8 (L) 09/17/2022   MCV 102.9 (H) 09/17/2022   PLT 127 (L) 09/17/2022   NEUTROABS 5.3 09/17/2022    Imaging:  No results found.  Medications: I have reviewed the patient's current medications.  Assessment/Plan: Pancreatic neuroendocrine tumor, WHO grade 2, pancreatic head mass and small peripancreatic/celiac nodes on a CT 02/04/2014 EUS revealed evidence of multiple liver metastases-status post an FNA biopsy of a left liver lesion 02/10/2014 confirming a neuroendocrine tumor   Octreotide scan 04/01/2014 with multiple foci of metastatic neuroendocrine tumor in the liver Monthly Sandostatin started 03/16/2014 Restaging CT 07/04/2014 with resolution of a previously noted pancreas head lesion and probable progression of liver metastases Restaging CT 10/31/2014-stable liver lesions, no evidence of a pancreas mass, stable. Restaging CT 02/28/2015-stable hepatic metastases, stable pancreas lesion unchanged Restaging CT 08/30/2015-stable pancreas mass, slight  enlargement of hepatic metastases Monthly Sandostatin continued Restaging CTs 01/17/2016 revealed enlargement of liver lesions, no new lesions, enlargement of the pancreas head mass Gallium DOTATATE scan 10/01/2016 confirmed uptake in the liver metastases, pancreas primary, peripancreatic adenopathy, and left iliac bone Cycle 1 Lutathera 03/05/2017 Cycle 2 Lutathera 04/30/2017 Cycle 3 Lutathera 07/02/2017 Cycle 4 Lutathera 08/27/2017 Netspot scan 09/29/2017-decreased radiotracer activity within the pancreas head, peripancreatic lymph node, and solitary skeletal lesion.  Liver lesions have increased in size with a decrease in radiotracer activity Monthly Sandostatin continued Netspot scan 05/20/2018-stable to decreased size of liver lesions, most have decreased SUV, similar uptake in the pancreas lesion, previously noted peripancreatic lymph node has resolved him a mild decrease in uptake associated with a left iliac metastasis, no evidence of disease progression Monthly Sandostatin continued Netspot scan 01/29/2019- overall stable to improved, 1 lesion left hepatic lobe has increased tracer activity-unchanged in size, majority of hepatic lesions have reduced activity compared to the pretreatment scan, decreased activity left iliac bone lesion, stable activity in the head of the pancreas lesion, no new lesions Monthly Sandostatin continued Netspot on September 24, 2019-increase in size of 3 liver lesions with associated stable radiotracer activity, no new lesions, stable small left iliac lesion Cycle 1 salvage Lutathera 10/27/2019 Cycle 2 salvage Lutathera 12/22/2019 Netspot on 02/15/2020-decrease in radiotracer activity in the liver metastases with a decrease in size of several lesions, no new lesions, no progressive disease in the bowel or mesentery, decreased activity in the left iliac bone lesion Monthly Sandostatin continued  Netspot 10/13/2020-decrease in radiotracer activity in the left and right  liver, decreased size of liver lesions, one lesion in the superior right liver has increased in size and has no radiotracer  activity, no evidence of metastatic disease outside the liver PET 11/15/2020- moderate hypermetabolic activity associated with several enlarging liver lesions that had limited activity on the dotatate PET, a lesion in the left lateral hepatic lobe with mild persistent dotatate activity has mild peripheral FDG activity and is decreased in size from a dotatate PET 1 year ago.  No hypermetabolic activity in the pancreas or bowel.  No hypermetabolic activity in the previous dotatate avid left iliac lesion Monthly Sandostatin continued Cycle 1 capecitabine/temozolomide 01/22/2021 (capecitabine twice daily days 1 through 14 of each 28-day cycle/temozolomide days 10 through 14) Cycle 2 capecitabine/temozolomide 02/19/2021 Cycle 3 capecitabine/temozolomide 03/19/2021 Cycle 4 capecitabine/temozolomide 04/17/2021 PET 05/11/2021-slight increase in size of several FDG avid liver lesions, no new hepatic lesions, no evidence of metastatic disease to the chest or bones Cycle 5 capecitabine/temozolomide 05/16/2021 Cycle 6 capecitabine/temozolomide 06/25/2021, capecitabine dose reduced Cycle 7 capecitabine/temozolomide 07/23/2021 Cycle 8 capecitabine/temozolomide 08/20/2021 PET 09/13/2021-mildly progressive hepatic metastases Everolimus 10/11/2021-discontinued after 2 weeks secondary to cost Cabozantinib 12/13/2021 Cabozantinib discontinued 12/24/2021 Cabozantinib resumed at a reduced dose of 20 mg daily beginning 01/12/2022 PET 03/28/2022-multifocal tracer avid liver metastases, mild increase in size and able to mild increase in tracer uptake, no extrahepatic disease identified Cabozantinib continued at a dose of 20 mg daily PET 07/15/2022-slight interval increase in size of hepatic masses with slight decrease in metabolic activity slight increase in pelvic ascites, no new site of disease Cycle 1 FOLFOX  08/07/2022 CT abdomen/pelvis 08/13/2022-markedly enlarged liver with numerous large hepatic metastatic lesions without interval change compared with PET/CT from December 2023. 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 chronic exertional dyspnea following treatment for the seminoma   5. aortic stenosis on an echocardiogram December 2011, severe aortic stenosis on echocardiogram 12/06/2015, status post TAVR on 04/16/2016 6. lumbar disc surgery 2014   7. acute abdominal pain 02/04/2014-resolved   8.  Diabetes 9.  COVID-19 infection July 2022 10.  Admission 08/13/2022 with dyspnea-evidence of volume overload with an elevated BNP, improved with diuresis.  Dyspnea multifactorial-cardiac disease with mitral valve, pulmonic valve, and tricuspid valve regurgitation    Disposition: Mr. Brown has now completed 4 cycles of FOLFOX.  He has made the decision to discontinue further treatment.  We will cancel the upcoming CT scans.  We are making a hospice referral.  CODE STATUS/end-of-life issues discussed.  He will be placed on No Code Blue status.  We will ask hospice to provide supplemental oxygen at home.  He will increase prednisone from 10 mg daily to 20 mg daily.  We refilled his torsemide prescription.  He would like to keep the next scheduled follow-up appointment on 10/01/2022.  We are available to see him sooner if needed.  We reviewed the above with his wife.  Patient seen with Dr. [name].  Ned Card ANP/GNP-BC   09/19/2022  1:12 PM  This was a shared visit with Ned Card.  Mr. Debar was interviewed and examined.  He is now at day 3 following cycle 4  FOLFOX.  His performance status continues to decline.  He does not wish to receive further chemotherapy.  He agrees to a home hospice referral.  He will begin a trial of  prednisone as an appetite stimulant and he will resume torsemide.  We discussed he is status with his wife.  I was present for greater than 50% of today's visit.  I performed medical decision making.  [name], MD

## 2022-09-20 DIAGNOSIS — I130 Hypertensive heart and chronic kidney disease with heart failure and stage 1 through stage 4 chronic kidney disease, or unspecified chronic kidney disease: Secondary | ICD-10-CM | POA: Diagnosis not present

## 2022-09-20 DIAGNOSIS — N183 Chronic kidney disease, stage 3 unspecified: Secondary | ICD-10-CM | POA: Diagnosis not present

## 2022-09-20 DIAGNOSIS — I350 Nonrheumatic aortic (valve) stenosis: Secondary | ICD-10-CM | POA: Diagnosis not present

## 2022-09-20 DIAGNOSIS — C254 Malignant neoplasm of endocrine pancreas: Secondary | ICD-10-CM | POA: Diagnosis not present

## 2022-09-20 DIAGNOSIS — I5030 Unspecified diastolic (congestive) heart failure: Secondary | ICD-10-CM | POA: Diagnosis not present

## 2022-09-20 DIAGNOSIS — R630 Anorexia: Secondary | ICD-10-CM | POA: Diagnosis not present

## 2022-09-20 DIAGNOSIS — R0600 Dyspnea, unspecified: Secondary | ICD-10-CM | POA: Diagnosis not present

## 2022-09-20 DIAGNOSIS — E1122 Type 2 diabetes mellitus with diabetic chronic kidney disease: Secondary | ICD-10-CM | POA: Diagnosis not present

## 2022-09-20 DIAGNOSIS — N400 Enlarged prostate without lower urinary tract symptoms: Secondary | ICD-10-CM | POA: Diagnosis not present

## 2022-09-20 DIAGNOSIS — Z8547 Personal history of malignant neoplasm of testis: Secondary | ICD-10-CM | POA: Diagnosis not present

## 2022-09-20 DIAGNOSIS — C7951 Secondary malignant neoplasm of bone: Secondary | ICD-10-CM | POA: Diagnosis not present

## 2022-09-20 DIAGNOSIS — C787 Secondary malignant neoplasm of liver and intrahepatic bile duct: Secondary | ICD-10-CM | POA: Diagnosis not present

## 2022-09-20 LAB — CHROMOGRANIN A

## 2022-09-21 DIAGNOSIS — C254 Malignant neoplasm of endocrine pancreas: Secondary | ICD-10-CM | POA: Diagnosis not present

## 2022-09-21 DIAGNOSIS — C787 Secondary malignant neoplasm of liver and intrahepatic bile duct: Secondary | ICD-10-CM | POA: Diagnosis not present

## 2022-09-21 DIAGNOSIS — C7951 Secondary malignant neoplasm of bone: Secondary | ICD-10-CM | POA: Diagnosis not present

## 2022-09-21 DIAGNOSIS — R630 Anorexia: Secondary | ICD-10-CM | POA: Diagnosis not present

## 2022-09-21 DIAGNOSIS — I350 Nonrheumatic aortic (valve) stenosis: Secondary | ICD-10-CM | POA: Diagnosis not present

## 2022-09-21 DIAGNOSIS — R0600 Dyspnea, unspecified: Secondary | ICD-10-CM | POA: Diagnosis not present

## 2022-09-22 DIAGNOSIS — R0600 Dyspnea, unspecified: Secondary | ICD-10-CM | POA: Diagnosis not present

## 2022-09-22 DIAGNOSIS — C7951 Secondary malignant neoplasm of bone: Secondary | ICD-10-CM | POA: Diagnosis not present

## 2022-09-22 DIAGNOSIS — R630 Anorexia: Secondary | ICD-10-CM | POA: Diagnosis not present

## 2022-09-22 DIAGNOSIS — C787 Secondary malignant neoplasm of liver and intrahepatic bile duct: Secondary | ICD-10-CM | POA: Diagnosis not present

## 2022-09-22 DIAGNOSIS — I350 Nonrheumatic aortic (valve) stenosis: Secondary | ICD-10-CM | POA: Diagnosis not present

## 2022-09-22 DIAGNOSIS — C254 Malignant neoplasm of endocrine pancreas: Secondary | ICD-10-CM | POA: Diagnosis not present

## 2022-09-23 DIAGNOSIS — C254 Malignant neoplasm of endocrine pancreas: Secondary | ICD-10-CM | POA: Diagnosis not present

## 2022-09-23 DIAGNOSIS — C787 Secondary malignant neoplasm of liver and intrahepatic bile duct: Secondary | ICD-10-CM | POA: Diagnosis not present

## 2022-09-23 DIAGNOSIS — C7951 Secondary malignant neoplasm of bone: Secondary | ICD-10-CM | POA: Diagnosis not present

## 2022-09-23 DIAGNOSIS — I350 Nonrheumatic aortic (valve) stenosis: Secondary | ICD-10-CM | POA: Diagnosis not present

## 2022-09-23 DIAGNOSIS — R630 Anorexia: Secondary | ICD-10-CM | POA: Diagnosis not present

## 2022-09-23 DIAGNOSIS — R0600 Dyspnea, unspecified: Secondary | ICD-10-CM | POA: Diagnosis not present

## 2022-09-25 ENCOUNTER — Ambulatory Visit (HOSPITAL_BASED_OUTPATIENT_CLINIC_OR_DEPARTMENT_OTHER): Payer: Medicare Other

## 2022-09-25 DIAGNOSIS — C787 Secondary malignant neoplasm of liver and intrahepatic bile duct: Secondary | ICD-10-CM | POA: Diagnosis not present

## 2022-09-25 DIAGNOSIS — R0600 Dyspnea, unspecified: Secondary | ICD-10-CM | POA: Diagnosis not present

## 2022-09-25 DIAGNOSIS — C254 Malignant neoplasm of endocrine pancreas: Secondary | ICD-10-CM | POA: Diagnosis not present

## 2022-09-25 DIAGNOSIS — I350 Nonrheumatic aortic (valve) stenosis: Secondary | ICD-10-CM | POA: Diagnosis not present

## 2022-09-25 DIAGNOSIS — R630 Anorexia: Secondary | ICD-10-CM | POA: Diagnosis not present

## 2022-09-25 DIAGNOSIS — C7951 Secondary malignant neoplasm of bone: Secondary | ICD-10-CM | POA: Diagnosis not present

## 2022-09-27 DIAGNOSIS — C787 Secondary malignant neoplasm of liver and intrahepatic bile duct: Secondary | ICD-10-CM | POA: Diagnosis not present

## 2022-09-27 DIAGNOSIS — R0600 Dyspnea, unspecified: Secondary | ICD-10-CM | POA: Diagnosis not present

## 2022-09-27 DIAGNOSIS — C254 Malignant neoplasm of endocrine pancreas: Secondary | ICD-10-CM | POA: Diagnosis not present

## 2022-09-27 DIAGNOSIS — C7951 Secondary malignant neoplasm of bone: Secondary | ICD-10-CM | POA: Diagnosis not present

## 2022-09-27 DIAGNOSIS — I350 Nonrheumatic aortic (valve) stenosis: Secondary | ICD-10-CM | POA: Diagnosis not present

## 2022-09-27 DIAGNOSIS — R630 Anorexia: Secondary | ICD-10-CM | POA: Diagnosis not present

## 2022-09-29 ENCOUNTER — Other Ambulatory Visit: Payer: Self-pay | Admitting: Oncology

## 2022-09-30 DIAGNOSIS — R630 Anorexia: Secondary | ICD-10-CM | POA: Diagnosis not present

## 2022-09-30 DIAGNOSIS — C254 Malignant neoplasm of endocrine pancreas: Secondary | ICD-10-CM | POA: Diagnosis not present

## 2022-09-30 DIAGNOSIS — I350 Nonrheumatic aortic (valve) stenosis: Secondary | ICD-10-CM | POA: Diagnosis not present

## 2022-09-30 DIAGNOSIS — C787 Secondary malignant neoplasm of liver and intrahepatic bile duct: Secondary | ICD-10-CM | POA: Diagnosis not present

## 2022-09-30 DIAGNOSIS — C7951 Secondary malignant neoplasm of bone: Secondary | ICD-10-CM | POA: Diagnosis not present

## 2022-09-30 DIAGNOSIS — R0600 Dyspnea, unspecified: Secondary | ICD-10-CM | POA: Diagnosis not present

## 2022-10-01 ENCOUNTER — Ambulatory Visit: Payer: Medicare Other

## 2022-10-01 ENCOUNTER — Inpatient Hospital Stay: Payer: Medicare Other | Admitting: Nurse Practitioner

## 2022-10-01 ENCOUNTER — Other Ambulatory Visit: Payer: Medicare Other

## 2022-10-01 DIAGNOSIS — R0600 Dyspnea, unspecified: Secondary | ICD-10-CM | POA: Diagnosis not present

## 2022-10-01 DIAGNOSIS — C787 Secondary malignant neoplasm of liver and intrahepatic bile duct: Secondary | ICD-10-CM | POA: Diagnosis not present

## 2022-10-01 DIAGNOSIS — I350 Nonrheumatic aortic (valve) stenosis: Secondary | ICD-10-CM | POA: Diagnosis not present

## 2022-10-01 DIAGNOSIS — R630 Anorexia: Secondary | ICD-10-CM | POA: Diagnosis not present

## 2022-10-01 DIAGNOSIS — C254 Malignant neoplasm of endocrine pancreas: Secondary | ICD-10-CM | POA: Diagnosis not present

## 2022-10-01 DIAGNOSIS — C7951 Secondary malignant neoplasm of bone: Secondary | ICD-10-CM | POA: Diagnosis not present

## 2022-10-02 DIAGNOSIS — C7951 Secondary malignant neoplasm of bone: Secondary | ICD-10-CM | POA: Diagnosis not present

## 2022-10-02 DIAGNOSIS — R0600 Dyspnea, unspecified: Secondary | ICD-10-CM | POA: Diagnosis not present

## 2022-10-02 DIAGNOSIS — C254 Malignant neoplasm of endocrine pancreas: Secondary | ICD-10-CM | POA: Diagnosis not present

## 2022-10-02 DIAGNOSIS — I350 Nonrheumatic aortic (valve) stenosis: Secondary | ICD-10-CM | POA: Diagnosis not present

## 2022-10-02 DIAGNOSIS — C787 Secondary malignant neoplasm of liver and intrahepatic bile duct: Secondary | ICD-10-CM | POA: Diagnosis not present

## 2022-10-02 DIAGNOSIS — R630 Anorexia: Secondary | ICD-10-CM | POA: Diagnosis not present

## 2022-10-03 DIAGNOSIS — R630 Anorexia: Secondary | ICD-10-CM | POA: Diagnosis not present

## 2022-10-03 DIAGNOSIS — R0600 Dyspnea, unspecified: Secondary | ICD-10-CM | POA: Diagnosis not present

## 2022-10-03 DIAGNOSIS — C7951 Secondary malignant neoplasm of bone: Secondary | ICD-10-CM | POA: Diagnosis not present

## 2022-10-03 DIAGNOSIS — I350 Nonrheumatic aortic (valve) stenosis: Secondary | ICD-10-CM | POA: Diagnosis not present

## 2022-10-03 DIAGNOSIS — C787 Secondary malignant neoplasm of liver and intrahepatic bile duct: Secondary | ICD-10-CM | POA: Diagnosis not present

## 2022-10-03 DIAGNOSIS — C254 Malignant neoplasm of endocrine pancreas: Secondary | ICD-10-CM | POA: Diagnosis not present

## 2022-10-04 DIAGNOSIS — E1122 Type 2 diabetes mellitus with diabetic chronic kidney disease: Secondary | ICD-10-CM | POA: Diagnosis not present

## 2022-10-04 DIAGNOSIS — C7951 Secondary malignant neoplasm of bone: Secondary | ICD-10-CM | POA: Diagnosis not present

## 2022-10-04 DIAGNOSIS — N183 Chronic kidney disease, stage 3 unspecified: Secondary | ICD-10-CM | POA: Diagnosis not present

## 2022-10-04 DIAGNOSIS — C787 Secondary malignant neoplasm of liver and intrahepatic bile duct: Secondary | ICD-10-CM | POA: Diagnosis not present

## 2022-10-04 DIAGNOSIS — R0600 Dyspnea, unspecified: Secondary | ICD-10-CM | POA: Diagnosis not present

## 2022-10-04 DIAGNOSIS — I130 Hypertensive heart and chronic kidney disease with heart failure and stage 1 through stage 4 chronic kidney disease, or unspecified chronic kidney disease: Secondary | ICD-10-CM | POA: Diagnosis not present

## 2022-10-04 DIAGNOSIS — N400 Enlarged prostate without lower urinary tract symptoms: Secondary | ICD-10-CM | POA: Diagnosis not present

## 2022-10-04 DIAGNOSIS — R630 Anorexia: Secondary | ICD-10-CM | POA: Diagnosis not present

## 2022-10-04 DIAGNOSIS — Z8547 Personal history of malignant neoplasm of testis: Secondary | ICD-10-CM | POA: Diagnosis not present

## 2022-10-04 DIAGNOSIS — I350 Nonrheumatic aortic (valve) stenosis: Secondary | ICD-10-CM | POA: Diagnosis not present

## 2022-10-04 DIAGNOSIS — C254 Malignant neoplasm of endocrine pancreas: Secondary | ICD-10-CM | POA: Diagnosis not present

## 2022-10-04 DIAGNOSIS — I5030 Unspecified diastolic (congestive) heart failure: Secondary | ICD-10-CM | POA: Diagnosis not present

## 2022-10-05 DIAGNOSIS — C254 Malignant neoplasm of endocrine pancreas: Secondary | ICD-10-CM | POA: Diagnosis not present

## 2022-10-05 DIAGNOSIS — C787 Secondary malignant neoplasm of liver and intrahepatic bile duct: Secondary | ICD-10-CM | POA: Diagnosis not present

## 2022-10-05 DIAGNOSIS — R630 Anorexia: Secondary | ICD-10-CM | POA: Diagnosis not present

## 2022-10-05 DIAGNOSIS — I350 Nonrheumatic aortic (valve) stenosis: Secondary | ICD-10-CM | POA: Diagnosis not present

## 2022-10-05 DIAGNOSIS — C7951 Secondary malignant neoplasm of bone: Secondary | ICD-10-CM | POA: Diagnosis not present

## 2022-10-05 DIAGNOSIS — R0600 Dyspnea, unspecified: Secondary | ICD-10-CM | POA: Diagnosis not present

## 2022-11-04 DEATH — deceased

## 2022-11-05 ENCOUNTER — Encounter: Payer: Self-pay | Admitting: Oncology

## 2022-11-12 ENCOUNTER — Ambulatory Visit: Payer: Medicare Other | Admitting: Internal Medicine

## 2022-11-25 ENCOUNTER — Encounter: Payer: Self-pay | Admitting: Cardiovascular Disease

## 2022-11-25 NOTE — Progress Notes (Unsigned)
Cardiology Office Note   Date:  11/25/2022   ID:  Ronald Thompson, DOB 1956-05-23, MRN [id number]  PCP:  Corwin Levins, MD  Cardiologist:   Kristeen Miss, MD   Chief Complaint  Patient presents with   Aortic Stenosis   Problem list 1. Aortic stenosis - likely had a bicuspid AV  2. Essential hypertension 3. Permanent fibrosis 4. Hyperlipidemia 5. Metastatic seminoma - to his chest ,  S/p chemo and XRT  Took VP 16 - caused some pulmonary fibrosis  6. Neuroendocrine tumur - Pancreatic cancer with mets to Liver .    Notes from 2017:  Ronald Thompson is a 67 y.o. male who presents for evaluation of his aortic stenosis. He has a history of metastatic seminoma up to his chest. He received high-dose chemotherapy and XRT .   It also accompanied some of his heart and extended down to the diaphragm.  2 years ago he was diagnosed with a neuroendocrine tumor-likely to be pancreatic cancer.  Gets saldostatin monthly .  He has some metastases to his liver.   He sees Mancel Bale  Has been told that there is no cure, but the growth rate has been slowed dramaticlly .    Has had dyspnea for the past 25 years- since the seminoma was treated .  Has significant DOE but he is able to lift weights.  Does have DOE with walking up hills or climbing stairs.  Does ok on level ground .   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 He had TAVR on April 16, 2016.  He is overall done very well. He has a complex medical history including metastatic pancreatic neuroendocrine tumor.  Still has DOE. And fatigue  Has pulmonary fibrosis from the chemo ( VP 16, bleomycin, radiation therapy)   Still working through the neuroendocrine tumor PET scan in several week  No CP  Does not jog. Works  [employer] on a level surface without difficulities.   BP is typically well controlled.   Sept. 29, 2020  Doing well .    Chronically short of breath  Has pulmonary fibrosis from chemo   Nov. 9, 2021:  Ronald Thompson is doing well.   Has lost 10-12 lbs.  Was diagnosed with DM2 TAVR is working well No dyspnea.   Can walk several miles without difficulty  Had his TAVR 4 years ago .    Nov. 15, 2022: Seen with Ronald Thompson.  Ronald Thompson is doing well.    S/p TAVR 5 years ago .  Still getting chemo every 2 weeks  No CP, Chronic dyspnea from pulmonary fibrosis from the chemo   Nov. 14, 2023 Ronald Thompson is seen  for follow up of his TAVR  S/p TAVR 6 years ago  Hx of a neuroendocrine tumor,  the chemo has caused some pulmonary fibrosis  Admits that the cancer is taking its toll now, liver lesions are growing .  Sees Dr. Truett Perna for his cancer  Unable to exercise because of weakness ( Cabometyx) and dyspnea   Ronald Thompson has alzheimers  Wt is 157 lbs   November 26, 2022 Ronald Thompson is seen for follow up of his TAVR Has a neuroendocrine tumor,  the chemo has caused pulmonary fibrosis     Past Medical History:  Diagnosis Date   Baker's cyst    Gallstones    Heart murmur    Hypercholesteremia  Hypertension    Lesion of left lung    hx.25 yrs ago- testicilar cancer related- only scarring left after tx.-no problems now   Liver metastasis    Occlusion of left subclavian vein 1990   and Brachiocephalic- DVT   during chemo left subclavian are larger than right.   Pericarditis    2nd to tumor   Pneumonia 1990   Primary neuroendocrine tumor of pancreas 02/14/2014   Stage IV with multiple mets to liver   Pulmonary fibrosis 11/08/2015   S/P TAVR (transcatheter aortic valve replacement) 04/16/2016   26 mm Edwards Sapien 3 transcatheter heart valve placed via percutaneous right transfemoral approach    Shortness of breath dyspnea    with exertion and when tired   Testicular seminoma 1990   with metatstatic spread - good response to therapy-radiation and chemotherapy   Transfusion history    hx. 25 yrs ago-during cancer tx.    Past  Surgical History:  Procedure Laterality Date   BACK SURGERY     '14- rupt. disc    CARDIAC CATHETERIZATION N/A 01/24/2016   Procedure: Right/Left Heart Cath and Coronary Angiography;  Surgeon: Tonny Bollman, MD;  Location: [facility] INVASIVE CV LAB;  Service: Cardiovascular;  Laterality: N/A;   COLONOSCOPY     EUS N/A 02/10/2014   Procedure: UPPER ENDOSCOPIC ULTRASOUND (EUS) LINEAR;  Surgeon: Rachael Fee, MD;  Location: [facility] ENDOSCOPY;  Service: Endoscopy;  Laterality: N/A;   IR IMAGING GUIDED PORT INSERTION  08/02/2022   IR RADIOLOGIST EVAL & MGMT  02/11/2017   KNEE SURGERY Right    age 39 for Baker's cyst   NASAL SEPTUM SURGERY     Deviated septrum   TEE WITHOUT CARDIOVERSION N/A 04/16/2016   Procedure: TRANSESOPHAGEAL ECHOCARDIOGRAM (TEE);  Surgeon: Tonny Bollman, MD;  Location: [facility] OR;  Service: Open Heart Surgery;  Laterality: N/A;   THORACOTOMY  1990   wedge biopsy of mediastinal mass   TRANSCATHETER AORTIC VALVE REPLACEMENT, TRANSFEMORAL N/A 04/16/2016   Procedure: TRANSCATHETER AORTIC VALVE REPLACEMENT, TRANSFEMORAL;  Surgeon: Tonny Bollman, MD;  Location[facility] OR;  Service: Open Heart Surgery;  Laterality: N/A;     Current Outpatient Medications  Medication Sig Dispense Refill   acetaminophen (TYLENOL) 500 MG tablet Take 2 tablets (1,000 mg total) by mouth every 8 (eight) hours as needed for headache. 30 tablet 0   albuterol (VENTOLIN HFA) 108 (90 Base) MCG/ACT inhaler Inhale 1-2 puffs into the lungs every 6 (six) hours as needed for wheezing or shortness of breath. (Patient not taking: Reported on 09/03/2022) 1 each 4   Aromatic Inhalants (VICKS VAPOR INHALER IN) Inhale 1 spray into the lungs daily as needed (congestion). (Patient not taking: Reported on 09/03/2022)     Cholecalciferol (VITAMIN D-3) 125 MCG (5000 UT) TABS Take 1 tablet by mouth daily in the afternoon.     diphenhydrAMINE (BENADRYL) 25 MG tablet Take 25-50 mg by mouth at bedtime. (Patient not taking: Reported on 09/03/2022)      Lancet Device MISC Use as instructed to test blood sugar daily E11.65 1 each 2   Lancets (ONETOUCH DELICA PLUS LANCET33G) MISC TEST ONCE DAILY 100 each 2   lidocaine-prilocaine (EMLA) cream Apply to portacath site 1 hour prior to use 30 g 2   loperamide (IMODIUM) 2 MG capsule Take 2 mg by mouth as needed for diarrhea or loose stools.     LORazepam (ATIVAN) 0.5 MG tablet Take 1 tablet (0.5 mg total) by mouth at bedtime as needed for anxiety or  sleep. 30 tablet 0   losartan (COZAAR) 100 MG tablet Take 1 tablet (100 mg total) by mouth daily. (Patient not taking: Reported on 09/17/2022) 90 tablet 3   predniSONE (DELTASONE) 10 MG tablet Take 1 tablet (10 mg total) by mouth daily with breakfast. 30 tablet 1   prochlorperazine (COMPAZINE) 10 MG tablet Take 1 tablet (10 mg total) by mouth every 6 (six) hours as needed for nausea or vomiting. (Patient not taking: Reported on 09/03/2022) 30 tablet 3   simvastatin (ZOCOR) 20 MG tablet TAKE ONE TABLET BY MOUTH DAILY AT 8PM (Patient taking differently: Take 20 mg by mouth daily at 6 PM. TAKE ONE TABLET BY MOUTH DAILY AT 8PM) 90 tablet 3   tamsulosin (FLOMAX) 0.4 MG CAPS capsule Take 1 capsule (0.4 mg total) by mouth daily. 90 capsule 3   torsemide (DEMADEX) 20 MG tablet Take 1 tablet (20 mg total) by mouth daily. 30 tablet 0   traMADol (ULTRAM) 50 MG tablet Take 1 tablet (50 mg total) by mouth every 6 (six) hours as needed for moderate pain. (Patient not taking: Reported on 09/17/2022) 30 tablet 0   No current facility-administered medications for this visit.   Facility-Administered Medications Ordered in Other Visits  Medication Dose Route Frequency Provider Last Rate Last Admin   heparin lock flush 100 unit/mL  500 Units Intravenous Once Ladene Artist, MD       octreotide (SANDOSTATIN LAR) 30 MG IM injection             Allergies:   Penicillins    Social History:  The patient  reports that he has never smoked. He has never used smokeless tobacco. He  reports current alcohol use. He reports that he does not use drugs.   Family History:  The patient's family history includes Cancer in his maternal aunt and paternal grandfather; Dementia in his mother; Emphysema in his maternal aunt.    ROS:  Please see the history of present illness.   Physical Exam: There were no vitals taken for this visit.  No BP recorded.  {Refresh Note OR Click here to enter BP  :1}***    GEN:  Well nourished, well developed in no acute distress HEENT: Normal NECK: No JVD; No carotid bruits LYMPHATICS: No lymphadenopathy CARDIAC: RRR ***, no murmurs, rubs, gallops RESPIRATORY:  Clear to auscultation without rales, wheezing or rhonchi  ABDOMEN: Soft, non-tender, non-distended MUSCULOSKELETAL:  No edema; No deformity  SKIN: Warm and dry NEUROLOGIC:  Alert and oriented x 3    EKG:     Recent Labs: 08/13/2022: B Natriuretic Peptide 1,164.3; TSH 1.980 08/29/2022: Magnesium 2.0 09/17/2022: ALT 17; BUN 30; Creatinine 1.03; Hemoglobin 9.6; Platelet Count 127; Potassium 3.6; Sodium 141    Lipid Panel    Component Value Date/Time   CHOL 135 05/09/2022 0857   TRIG 71.0 05/09/2022 0857   HDL 40.60 05/09/2022 0857   CHOLHDL 3 05/09/2022 0857   VLDL 14.2 05/09/2022 0857   LDLCALC 80 05/09/2022 0857   LDLDIRECT 139.2 10/10/2008 0800      Wt Readings from Last 3 Encounters:  09/17/22 150 lb (68 kg)  09/03/22 146 lb 9.6 oz (66.5 kg)  09/02/22 145 lb (65.8 kg)      Other studies Reviewed: Additional studies/ records that were reviewed today include: . Review of the above records demonstrates:    ASSESSMENT AND PLAN:  1.  Aortic stenosis:   s/p TAVR.        2.  Hypertension:  3.  Pulmonic insufficiency:       Current medicines are reviewed at length with the patient today.  The patient does not have concerns regarding medicines.  The following changes have been made:  no change  Labs/ tests ordered today include:   No orders of the  defined types were placed in this encounter.    Kristeen Miss, MD  11/25/2022 8:57 PM    [facility] [address] [place], [place], [place]  [address] Phone: [phone]; Fax: [phone]

## 2022-11-26 ENCOUNTER — Ambulatory Visit: Payer: PRIVATE HEALTH INSURANCE | Attending: Cardiovascular Disease | Admitting: Cardiovascular Disease

## 2023-05-07 ENCOUNTER — Other Ambulatory Visit (HOSPITAL_COMMUNITY): Payer: Self-pay

## 2024-01-06 IMAGING — PT NM PET TUM IMG RESTAG (PS) SKULL BASE T - THIGH
7 series · 25 of 25 positions shown · non-contrast
Comparison: PET-CT dated 05/11/2021

CLINICAL DATA: Subsequent treatment strategy for pancreatic
neuroendocrine tumor.

EXAM:
NUCLEAR MEDICINE PET SKULL BASE TO THIGH
TECHNIQUE: 8.0 mCi F-18 FDG was injected intravenously. Full-ring PET imaging
was performed from the skull base to thigh after the radiotracer. CT
data was obtained and used for attenuation correction and anatomic
localization.
Fasting blood glucose: 110 mg/dl

[Series 3: pet sk_thigh ac · axial · 5.0mm · 4.07mm/px · z∈[-1000,-28]mm · 5 of 244 slices shown]
[im 1/244]
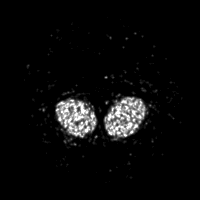
[im 61/244]
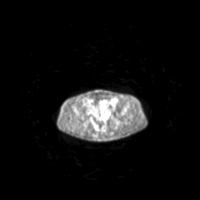
[im 122/244]
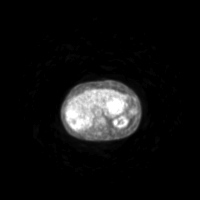
[im 183/244]
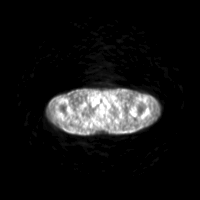
[im 244/244]
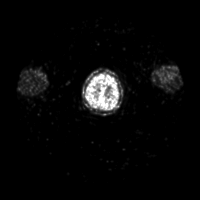

[Series 4: ct sk_thigh 5.0 bf37 · axial · 5.0mm · 0.98mm/px · z∈[-1000,-28]mm · 6 of 244 slices shown]
[im 1/244]
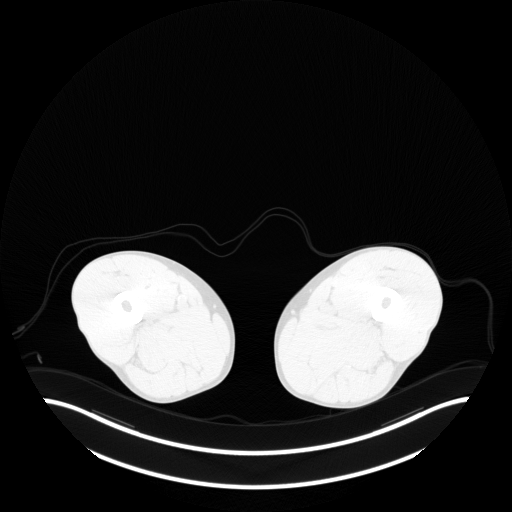
[im 49/244]
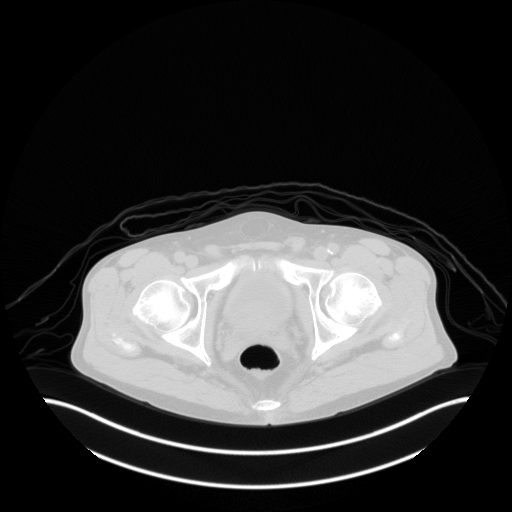
[im 98/244]
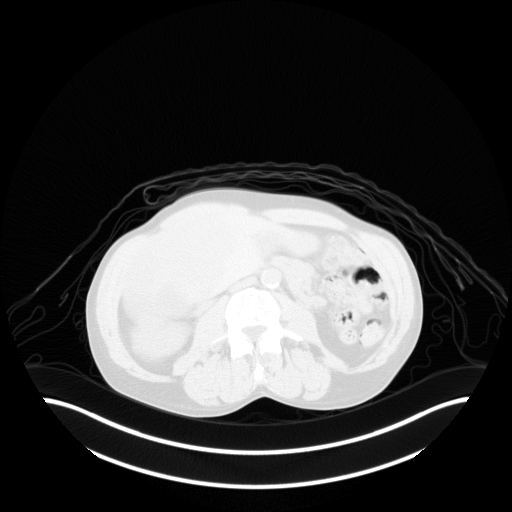
[im 146/244]
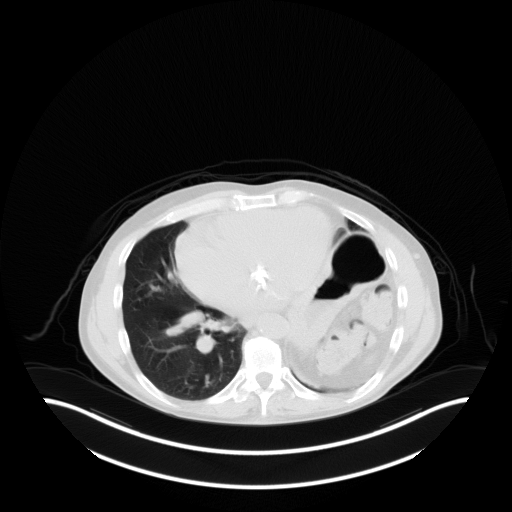
[im 195/244]
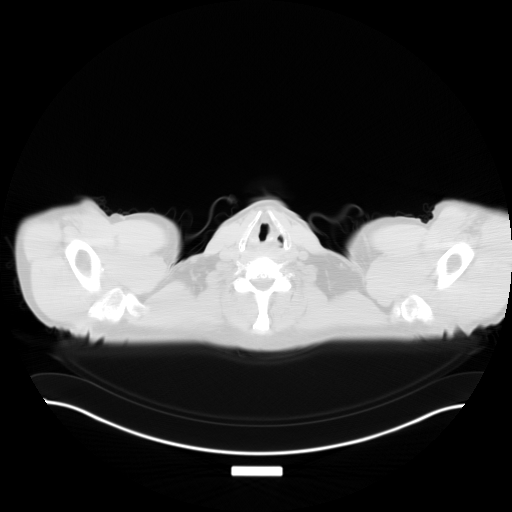
[im 244/244  brain]
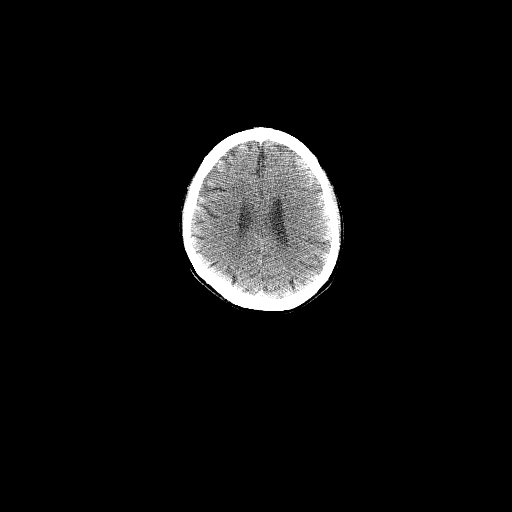

[Series 5: pet sk_thigh nac · axial · 5.0mm · 4.07mm/px · z∈[-1000,-28]mm · 6 of 244 slices shown]
[im 1/244]
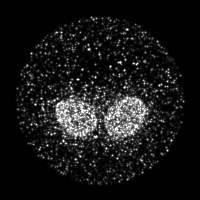
[im 49/244]
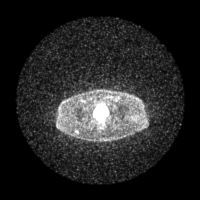
[im 98/244]
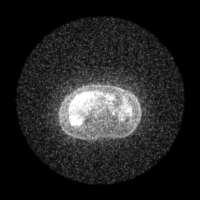
[im 146/244]
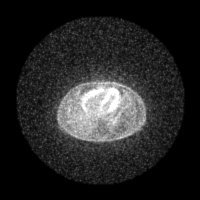
[im 195/244]
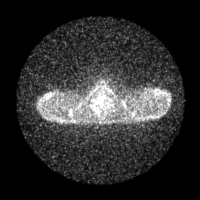
[im 244/244]
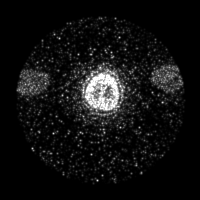

[Series 8: ct sk_thigh 5.0 br59 (id)_bone · axial · 5.0mm · 0.77mm/px · z∈[-542,-234]mm · 2 of 78 slices shown]
[im 1/78]
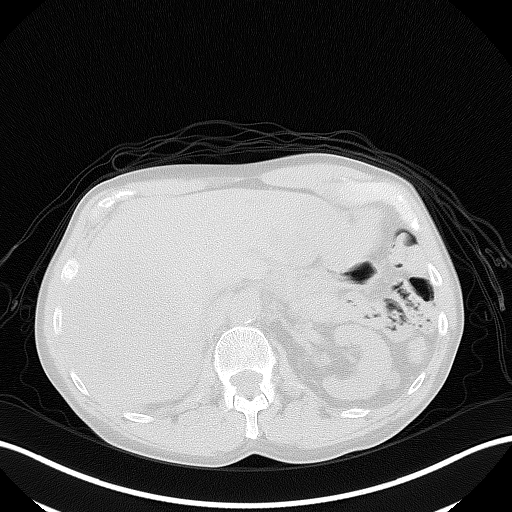
[im 78/78]
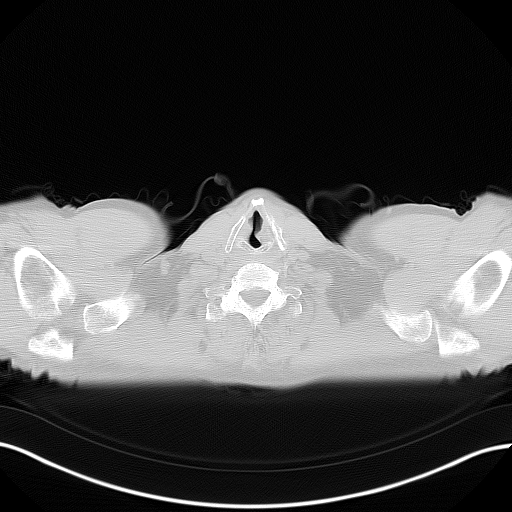

[Series 603: <mip collection> · coronal · 2.02mm/px · 1 of 32 slices shown]
[im 1/32]
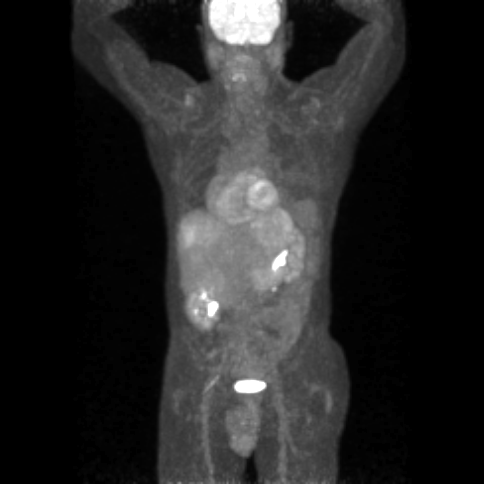

[Series 605: fused cor · 1 of 55 slices shown]
[im 1/55]
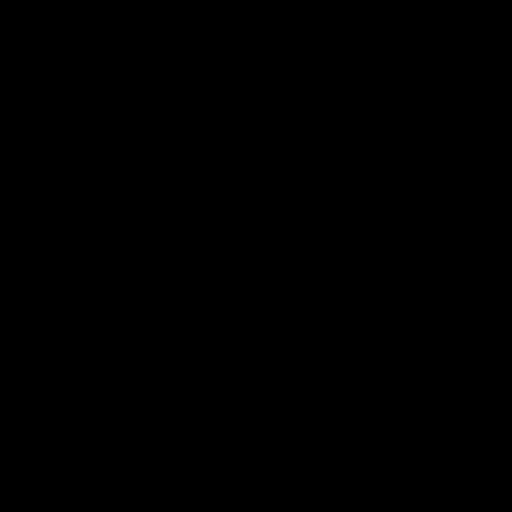

[Series 606: range-ct sk_thigh 5.0 bf37-tra-<alpha range> · 4 of 189 slices shown]
[im 1/189]
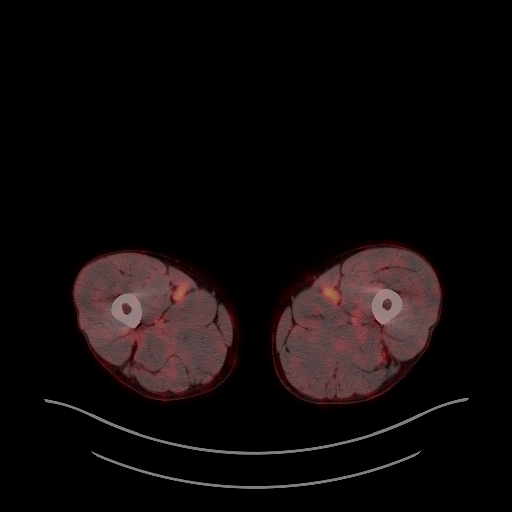
[im 63/189]
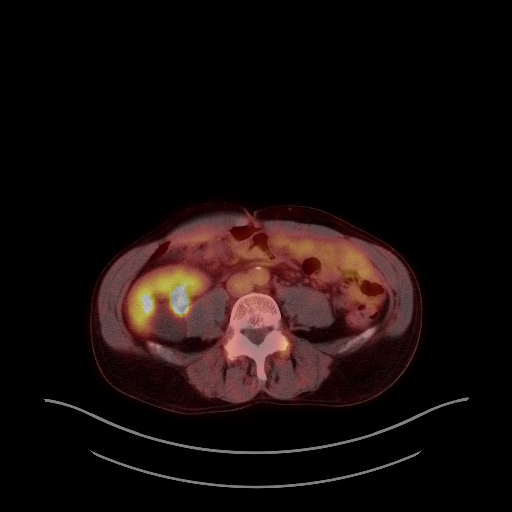
[im 126/189]
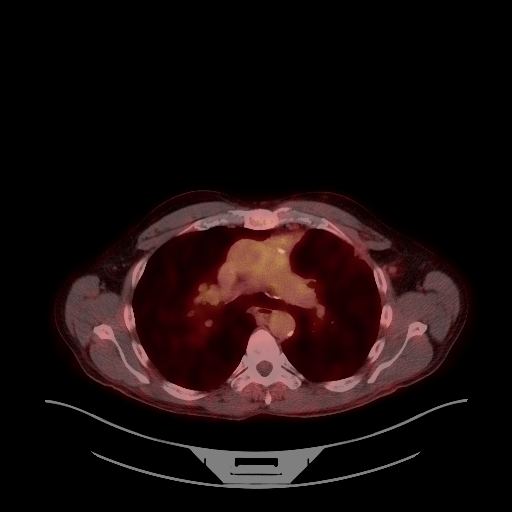
[im 189/189]
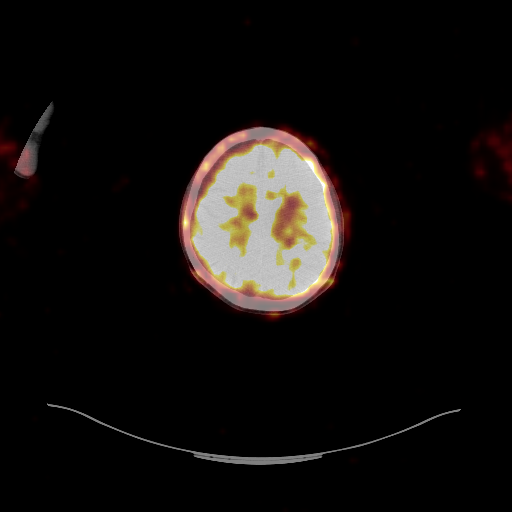

[25 of 25 positions shown; findings below may reference images not displayed]

FINDINGS: Mediastinal blood pool activity: SUV max

Liver activity: SUV max NA

NECK: No hypermetabolic cervical lymphadenopathy.

Incidental CT findings: none

CHEST: No hypermetabolic thoracic lymphadenopathy.

No suspicious pulmonary nodules.

Incidental CT findings: Eventration left hemidiaphragm with
associated left basilar atelectasis. Prosthetic aortic valve. Mitral
valve annular calcifications.

ABDOMEN/PELVIS: Multifocal hepatic metastases, including:

--9.5 cm segment 2 lesion (series 4/image 116) with max SUV 5.9,
previously 8.5 cm with max SUV

--12.3 cm superior right hepatic lobe lesion (series 4/image 124)
with max SUV 6.7, previously 11 cm with max SUV

--7.8 cm segment 3 lesion (series 4/image 140) with max SUV 6.5,
previously 6.6 cm with max SUV

--Two near confluent inferior right hepatic lobe masses measuring
12.3 cm in aggregate (series 4/image 146), max SUV 4.2, previously
12.5 cm with max SUV

Overall, the hypermetabolism is generally similar, but there is mild
interval progression in size of most lesions.

No hypermetabolic abdominopelvic lymphadenopathy.

Incidental CT findings: Bilateral renal cysts. Atherosclerotic
calcifications abdominal aorta and branch vessels. Thick-walled
bladder, although underdistended.

SKELETON: No focal hypermetabolic activity to suggest skeletal
metastasis.

Incidental CT findings: none
IMPRESSION: Mildly progressive hepatic metastases, as above.

Otherwise, no evidence of metastatic disease.
